# Patient Record
Sex: Female | Born: 1960
Health system: Southern US, Community
[De-identification: ages and names within clinical notes are randomized; demographics above are authoritative.]

## PROBLEM LIST (undated history)

## (undated) ENCOUNTER — Ambulatory Visit: Payer: Medicare PPO

## (undated) DIAGNOSIS — J189 Pneumonia, unspecified organism: Secondary | ICD-10-CM

## (undated) DIAGNOSIS — G4733 Obstructive sleep apnea (adult) (pediatric): Secondary | ICD-10-CM

## (undated) DIAGNOSIS — G2581 Restless legs syndrome: Secondary | ICD-10-CM

## (undated) DIAGNOSIS — R32 Unspecified urinary incontinence: Secondary | ICD-10-CM

## (undated) DIAGNOSIS — F444 Conversion disorder with motor symptom or deficit: Secondary | ICD-10-CM

## (undated) DIAGNOSIS — F419 Anxiety disorder, unspecified: Secondary | ICD-10-CM

## (undated) DIAGNOSIS — M545 Low back pain, unspecified: Secondary | ICD-10-CM

## (undated) DIAGNOSIS — M797 Fibromyalgia: Secondary | ICD-10-CM

## (undated) DIAGNOSIS — G8929 Other chronic pain: Secondary | ICD-10-CM

## (undated) DIAGNOSIS — I10 Essential (primary) hypertension: Secondary | ICD-10-CM

## (undated) DIAGNOSIS — E7800 Pure hypercholesterolemia, unspecified: Secondary | ICD-10-CM

## (undated) DIAGNOSIS — Z8639 Personal history of other endocrine, nutritional and metabolic disease: Secondary | ICD-10-CM

## (undated) DIAGNOSIS — G43909 Migraine, unspecified, not intractable, without status migrainosus: Secondary | ICD-10-CM

## (undated) DIAGNOSIS — R011 Cardiac murmur, unspecified: Secondary | ICD-10-CM

## (undated) DIAGNOSIS — G4730 Sleep apnea, unspecified: Secondary | ICD-10-CM

## (undated) DIAGNOSIS — K219 Gastro-esophageal reflux disease without esophagitis: Secondary | ICD-10-CM

## (undated) DIAGNOSIS — F32A Depression, unspecified: Secondary | ICD-10-CM

## (undated) DIAGNOSIS — E785 Hyperlipidemia, unspecified: Secondary | ICD-10-CM

## (undated) DIAGNOSIS — Z87442 Personal history of urinary calculi: Secondary | ICD-10-CM

## (undated) DIAGNOSIS — F449 Dissociative and conversion disorder, unspecified: Secondary | ICD-10-CM

## (undated) DIAGNOSIS — K5792 Diverticulitis of intestine, part unspecified, without perforation or abscess without bleeding: Secondary | ICD-10-CM

## (undated) DIAGNOSIS — F329 Major depressive disorder, single episode, unspecified: Secondary | ICD-10-CM

## (undated) DIAGNOSIS — Z8711 Personal history of peptic ulcer disease: Secondary | ICD-10-CM

## (undated) DIAGNOSIS — K589 Irritable bowel syndrome without diarrhea: Secondary | ICD-10-CM

## (undated) DIAGNOSIS — IMO0001 Reserved for inherently not codable concepts without codable children: Secondary | ICD-10-CM

## (undated) DIAGNOSIS — Z8719 Personal history of other diseases of the digestive system: Secondary | ICD-10-CM

## (undated) DIAGNOSIS — R4701 Aphasia: Secondary | ICD-10-CM

## (undated) DIAGNOSIS — R42 Dizziness and giddiness: Secondary | ICD-10-CM

## (undated) DIAGNOSIS — E669 Obesity, unspecified: Secondary | ICD-10-CM

## (undated) DIAGNOSIS — Z9989 Dependence on other enabling machines and devices: Secondary | ICD-10-CM

## (undated) DIAGNOSIS — Z531 Procedure and treatment not carried out because of patient's decision for reasons of belief and group pressure: Secondary | ICD-10-CM

## (undated) DIAGNOSIS — M1990 Unspecified osteoarthritis, unspecified site: Secondary | ICD-10-CM

## (undated) HISTORY — DX: Obesity, unspecified: E66.9

## (undated) HISTORY — PX: HERNIA REPAIR: SHX51

## (undated) HISTORY — DX: Migraine, unspecified, not intractable, without status migrainosus: G43.909

## (undated) HISTORY — DX: Unspecified osteoarthritis, unspecified site: M19.90

## (undated) HISTORY — PX: COLONOSCOPY: SHX174

## (undated) HISTORY — DX: Anxiety disorder, unspecified: F41.9

## (undated) HISTORY — DX: Hyperlipidemia, unspecified: E78.5

## (undated) HISTORY — DX: Essential (primary) hypertension: I10

## (undated) HISTORY — PX: BACK SURGERY: SHX140

## (undated) HISTORY — DX: Irritable bowel syndrome, unspecified: K58.9

## (undated) HISTORY — DX: Sleep apnea, unspecified: G47.30

## (undated) HISTORY — PX: UMBILICAL HERNIA REPAIR: SHX196

## (undated) HISTORY — DX: Cardiac murmur, unspecified: R01.1

## (undated) HISTORY — DX: Dissociative and conversion disorder, unspecified: F44.9

## (undated) HISTORY — DX: Major depressive disorder, single episode, unspecified: F32.9

## (undated) HISTORY — PX: UPPER GI ENDOSCOPY: SHX6162

## (undated) HISTORY — DX: Depression, unspecified: F32.A

## (undated) HISTORY — DX: Dizziness and giddiness: R42

## (undated) HISTORY — DX: Gastro-esophageal reflux disease without esophagitis: K21.9

---

## 1988-12-02 HISTORY — PX: ABDOMINAL HYSTERECTOMY: SHX81

## 1994-06-05 ENCOUNTER — Encounter (INDEPENDENT_AMBULATORY_CARE_PROVIDER_SITE_OTHER): Payer: Self-pay | Admitting: *Deleted

## 1998-03-23 ENCOUNTER — Ambulatory Visit (HOSPITAL_COMMUNITY): Admission: RE | Admit: 1998-03-23 | Discharge: 1998-03-23 | Payer: Self-pay | Admitting: Obstetrics and Gynecology

## 1998-04-05 ENCOUNTER — Encounter: Payer: Self-pay | Admitting: Gastroenterology

## 1998-10-23 ENCOUNTER — Other Ambulatory Visit: Admission: RE | Admit: 1998-10-23 | Discharge: 1998-10-23 | Payer: Self-pay | Admitting: Obstetrics and Gynecology

## 1998-11-28 ENCOUNTER — Ambulatory Visit (HOSPITAL_COMMUNITY): Admission: RE | Admit: 1998-11-28 | Discharge: 1998-11-28 | Payer: Self-pay | Admitting: Oral Surgery

## 1998-11-28 ENCOUNTER — Encounter: Payer: Self-pay | Admitting: Oral Surgery

## 1999-04-20 ENCOUNTER — Ambulatory Visit (HOSPITAL_COMMUNITY): Admission: RE | Admit: 1999-04-20 | Discharge: 1999-04-20 | Payer: Self-pay | Admitting: Internal Medicine

## 1999-04-20 ENCOUNTER — Encounter: Payer: Self-pay | Admitting: Internal Medicine

## 1999-07-31 ENCOUNTER — Other Ambulatory Visit: Admission: RE | Admit: 1999-07-31 | Discharge: 1999-07-31 | Payer: Self-pay | Admitting: Obstetrics and Gynecology

## 1999-08-03 ENCOUNTER — Ambulatory Visit (HOSPITAL_COMMUNITY): Admission: RE | Admit: 1999-08-03 | Discharge: 1999-08-03 | Payer: Self-pay | Admitting: Internal Medicine

## 1999-08-03 ENCOUNTER — Encounter: Payer: Self-pay | Admitting: Internal Medicine

## 2000-05-26 ENCOUNTER — Emergency Department (HOSPITAL_COMMUNITY): Admission: EM | Admit: 2000-05-26 | Discharge: 2000-05-26 | Payer: Self-pay | Admitting: *Deleted

## 2000-05-26 ENCOUNTER — Encounter: Payer: Self-pay | Admitting: *Deleted

## 2000-07-30 ENCOUNTER — Other Ambulatory Visit: Admission: RE | Admit: 2000-07-30 | Discharge: 2000-07-30 | Payer: Self-pay | Admitting: *Deleted

## 2000-08-10 ENCOUNTER — Encounter: Payer: Self-pay | Admitting: Emergency Medicine

## 2000-08-10 ENCOUNTER — Emergency Department (HOSPITAL_COMMUNITY): Admission: EM | Admit: 2000-08-10 | Discharge: 2000-08-10 | Payer: Self-pay | Admitting: Emergency Medicine

## 2000-08-12 ENCOUNTER — Ambulatory Visit (HOSPITAL_COMMUNITY): Admission: RE | Admit: 2000-08-12 | Discharge: 2000-08-12 | Payer: Self-pay | Admitting: Internal Medicine

## 2000-08-12 ENCOUNTER — Encounter: Payer: Self-pay | Admitting: Internal Medicine

## 2000-08-21 ENCOUNTER — Emergency Department (HOSPITAL_COMMUNITY): Admission: EM | Admit: 2000-08-21 | Discharge: 2000-08-21 | Payer: Self-pay | Admitting: Emergency Medicine

## 2000-09-03 ENCOUNTER — Encounter: Payer: Self-pay | Admitting: Internal Medicine

## 2000-09-15 ENCOUNTER — Inpatient Hospital Stay (HOSPITAL_COMMUNITY): Admission: EM | Admit: 2000-09-15 | Discharge: 2000-09-16 | Payer: Self-pay | Admitting: Emergency Medicine

## 2000-09-15 ENCOUNTER — Encounter: Payer: Self-pay | Admitting: Emergency Medicine

## 2000-10-07 ENCOUNTER — Encounter (INDEPENDENT_AMBULATORY_CARE_PROVIDER_SITE_OTHER): Payer: Self-pay | Admitting: *Deleted

## 2000-10-07 ENCOUNTER — Encounter (INDEPENDENT_AMBULATORY_CARE_PROVIDER_SITE_OTHER): Payer: Self-pay

## 2000-10-07 ENCOUNTER — Observation Stay (HOSPITAL_COMMUNITY): Admission: RE | Admit: 2000-10-07 | Discharge: 2000-10-08 | Payer: Self-pay | Admitting: General Surgery

## 2000-10-07 ENCOUNTER — Encounter: Payer: Self-pay | Admitting: General Surgery

## 2000-10-07 HISTORY — PX: LAPAROSCOPIC CHOLECYSTECTOMY: SUR755

## 2001-01-16 ENCOUNTER — Emergency Department (HOSPITAL_COMMUNITY): Admission: EM | Admit: 2001-01-16 | Discharge: 2001-01-16 | Payer: Self-pay | Admitting: Emergency Medicine

## 2001-01-16 ENCOUNTER — Encounter: Payer: Self-pay | Admitting: Emergency Medicine

## 2001-02-24 ENCOUNTER — Ambulatory Visit (HOSPITAL_COMMUNITY): Admission: RE | Admit: 2001-02-24 | Discharge: 2001-02-24 | Payer: Self-pay | Admitting: Orthopaedic Surgery

## 2001-02-24 ENCOUNTER — Encounter: Payer: Self-pay | Admitting: Orthopaedic Surgery

## 2001-07-30 ENCOUNTER — Other Ambulatory Visit: Admission: RE | Admit: 2001-07-30 | Discharge: 2001-07-30 | Payer: Self-pay | Admitting: Obstetrics and Gynecology

## 2001-09-17 ENCOUNTER — Ambulatory Visit (HOSPITAL_BASED_OUTPATIENT_CLINIC_OR_DEPARTMENT_OTHER): Admission: RE | Admit: 2001-09-17 | Discharge: 2001-09-17 | Payer: Self-pay | Admitting: Otolaryngology

## 2002-08-24 ENCOUNTER — Other Ambulatory Visit: Admission: RE | Admit: 2002-08-24 | Discharge: 2002-08-24 | Payer: Self-pay | Admitting: Obstetrics and Gynecology

## 2003-03-04 ENCOUNTER — Encounter (INDEPENDENT_AMBULATORY_CARE_PROVIDER_SITE_OTHER): Payer: Self-pay | Admitting: *Deleted

## 2003-03-04 ENCOUNTER — Ambulatory Visit (HOSPITAL_COMMUNITY): Admission: RE | Admit: 2003-03-04 | Discharge: 2003-03-04 | Payer: Self-pay | Admitting: Internal Medicine

## 2003-03-04 ENCOUNTER — Encounter: Payer: Self-pay | Admitting: Internal Medicine

## 2003-05-01 ENCOUNTER — Emergency Department (HOSPITAL_COMMUNITY): Admission: EM | Admit: 2003-05-01 | Discharge: 2003-05-01 | Payer: Self-pay | Admitting: Emergency Medicine

## 2003-05-01 ENCOUNTER — Encounter: Payer: Self-pay | Admitting: Emergency Medicine

## 2003-08-30 ENCOUNTER — Other Ambulatory Visit: Admission: RE | Admit: 2003-08-30 | Discharge: 2003-08-30 | Payer: Self-pay | Admitting: Obstetrics and Gynecology

## 2004-02-10 ENCOUNTER — Inpatient Hospital Stay (HOSPITAL_COMMUNITY): Admission: RE | Admit: 2004-02-10 | Discharge: 2004-02-14 | Payer: Self-pay | Admitting: Neurosurgery

## 2004-02-10 HISTORY — PX: ANTERIOR CERVICAL DECOMP/DISCECTOMY FUSION: SHX1161

## 2004-04-10 ENCOUNTER — Encounter (INDEPENDENT_AMBULATORY_CARE_PROVIDER_SITE_OTHER): Payer: Self-pay | Admitting: *Deleted

## 2004-04-10 ENCOUNTER — Encounter: Payer: Self-pay | Admitting: Endocrinology

## 2004-04-10 ENCOUNTER — Ambulatory Visit (HOSPITAL_COMMUNITY): Admission: RE | Admit: 2004-04-10 | Discharge: 2004-04-10 | Payer: Self-pay | Admitting: Endocrinology

## 2004-04-16 ENCOUNTER — Encounter: Admission: RE | Admit: 2004-04-16 | Discharge: 2004-05-16 | Payer: Self-pay | Admitting: Neurosurgery

## 2004-05-08 ENCOUNTER — Encounter: Payer: Self-pay | Admitting: Internal Medicine

## 2004-05-08 ENCOUNTER — Ambulatory Visit (HOSPITAL_COMMUNITY): Admission: RE | Admit: 2004-05-08 | Discharge: 2004-05-08 | Payer: Self-pay | Admitting: Internal Medicine

## 2004-10-20 ENCOUNTER — Ambulatory Visit: Payer: Self-pay | Admitting: Psychiatry

## 2004-10-20 ENCOUNTER — Emergency Department (HOSPITAL_COMMUNITY): Admission: EM | Admit: 2004-10-20 | Discharge: 2004-10-20 | Payer: Self-pay | Admitting: Emergency Medicine

## 2004-10-20 ENCOUNTER — Inpatient Hospital Stay (HOSPITAL_COMMUNITY): Admission: EM | Admit: 2004-10-20 | Discharge: 2004-10-21 | Payer: Self-pay | Admitting: Psychiatry

## 2004-12-05 ENCOUNTER — Ambulatory Visit: Payer: Self-pay | Admitting: Internal Medicine

## 2004-12-13 ENCOUNTER — Ambulatory Visit: Payer: Self-pay | Admitting: Internal Medicine

## 2005-01-29 ENCOUNTER — Ambulatory Visit: Payer: Self-pay | Admitting: Internal Medicine

## 2005-01-31 ENCOUNTER — Ambulatory Visit: Payer: Self-pay | Admitting: Internal Medicine

## 2005-08-01 ENCOUNTER — Emergency Department (HOSPITAL_COMMUNITY): Admission: EM | Admit: 2005-08-01 | Discharge: 2005-08-01 | Payer: Self-pay | Admitting: Family Medicine

## 2005-08-04 ENCOUNTER — Emergency Department (HOSPITAL_COMMUNITY): Admission: AD | Admit: 2005-08-04 | Discharge: 2005-08-04 | Payer: Self-pay | Admitting: Family Medicine

## 2005-09-29 ENCOUNTER — Emergency Department (HOSPITAL_COMMUNITY): Admission: EM | Admit: 2005-09-29 | Discharge: 2005-09-29 | Payer: Self-pay | Admitting: Emergency Medicine

## 2006-02-10 ENCOUNTER — Ambulatory Visit: Payer: Self-pay | Admitting: Internal Medicine

## 2007-06-14 ENCOUNTER — Inpatient Hospital Stay (HOSPITAL_COMMUNITY): Admission: EM | Admit: 2007-06-14 | Discharge: 2007-06-18 | Payer: Self-pay | Admitting: Emergency Medicine

## 2007-06-17 ENCOUNTER — Ambulatory Visit: Payer: Self-pay | Admitting: Internal Medicine

## 2007-06-22 ENCOUNTER — Ambulatory Visit: Payer: Self-pay | Admitting: Internal Medicine

## 2007-06-23 DIAGNOSIS — R51 Headache: Secondary | ICD-10-CM

## 2007-06-23 DIAGNOSIS — K589 Irritable bowel syndrome without diarrhea: Secondary | ICD-10-CM

## 2007-06-23 DIAGNOSIS — F988 Other specified behavioral and emotional disorders with onset usually occurring in childhood and adolescence: Secondary | ICD-10-CM | POA: Insufficient documentation

## 2007-06-23 DIAGNOSIS — K219 Gastro-esophageal reflux disease without esophagitis: Secondary | ICD-10-CM | POA: Insufficient documentation

## 2007-06-23 DIAGNOSIS — E041 Nontoxic single thyroid nodule: Secondary | ICD-10-CM | POA: Insufficient documentation

## 2007-06-23 DIAGNOSIS — F4321 Adjustment disorder with depressed mood: Secondary | ICD-10-CM | POA: Insufficient documentation

## 2007-06-23 DIAGNOSIS — E785 Hyperlipidemia, unspecified: Secondary | ICD-10-CM

## 2007-06-23 DIAGNOSIS — M26609 Unspecified temporomandibular joint disorder, unspecified side: Secondary | ICD-10-CM | POA: Insufficient documentation

## 2007-07-07 ENCOUNTER — Ambulatory Visit: Payer: Self-pay | Admitting: Internal Medicine

## 2007-08-05 ENCOUNTER — Ambulatory Visit: Payer: Self-pay | Admitting: Internal Medicine

## 2007-09-10 ENCOUNTER — Encounter: Payer: Self-pay | Admitting: Internal Medicine

## 2007-10-02 ENCOUNTER — Ambulatory Visit: Payer: Self-pay | Admitting: Internal Medicine

## 2007-10-02 ENCOUNTER — Encounter: Payer: Self-pay | Admitting: Internal Medicine

## 2007-12-31 ENCOUNTER — Encounter: Payer: Self-pay | Admitting: Internal Medicine

## 2008-01-19 ENCOUNTER — Ambulatory Visit: Payer: Self-pay | Admitting: Internal Medicine

## 2008-01-19 DIAGNOSIS — R42 Dizziness and giddiness: Secondary | ICD-10-CM | POA: Insufficient documentation

## 2008-01-19 DIAGNOSIS — R197 Diarrhea, unspecified: Secondary | ICD-10-CM

## 2008-01-21 ENCOUNTER — Telehealth: Payer: Self-pay | Admitting: Internal Medicine

## 2008-04-18 ENCOUNTER — Ambulatory Visit: Payer: Self-pay | Admitting: Internal Medicine

## 2008-04-19 LAB — CONVERTED CEMR LAB
BUN: 7 mg/dL (ref 6–23)
CO2: 29 meq/L (ref 19–32)
Chloride: 108 meq/L (ref 96–112)
Creatinine, Ser: 0.7 mg/dL (ref 0.4–1.2)
Potassium: 4.3 meq/L (ref 3.5–5.1)
Sodium: 140 meq/L (ref 135–145)
TSH: 0.83 microintl units/mL (ref 0.35–5.50)

## 2008-05-02 ENCOUNTER — Ambulatory Visit: Payer: Self-pay | Admitting: Internal Medicine

## 2008-05-02 DIAGNOSIS — R110 Nausea: Secondary | ICD-10-CM

## 2008-05-09 ENCOUNTER — Telehealth: Payer: Self-pay | Admitting: Internal Medicine

## 2008-05-11 ENCOUNTER — Telehealth: Payer: Self-pay | Admitting: Internal Medicine

## 2008-05-11 ENCOUNTER — Encounter (INDEPENDENT_AMBULATORY_CARE_PROVIDER_SITE_OTHER): Payer: Self-pay | Admitting: *Deleted

## 2008-05-12 ENCOUNTER — Encounter: Payer: Self-pay | Admitting: Internal Medicine

## 2008-05-13 ENCOUNTER — Telehealth: Payer: Self-pay | Admitting: Gastroenterology

## 2008-05-24 ENCOUNTER — Encounter: Payer: Self-pay | Admitting: Internal Medicine

## 2008-05-27 ENCOUNTER — Ambulatory Visit: Payer: Self-pay | Admitting: Internal Medicine

## 2008-05-31 ENCOUNTER — Telehealth (INDEPENDENT_AMBULATORY_CARE_PROVIDER_SITE_OTHER): Payer: Self-pay | Admitting: *Deleted

## 2008-05-31 ENCOUNTER — Ambulatory Visit (HOSPITAL_COMMUNITY): Admission: RE | Admit: 2008-05-31 | Discharge: 2008-05-31 | Payer: Self-pay | Admitting: Internal Medicine

## 2008-05-31 ENCOUNTER — Encounter (INDEPENDENT_AMBULATORY_CARE_PROVIDER_SITE_OTHER): Payer: Self-pay | Admitting: *Deleted

## 2008-08-19 ENCOUNTER — Ambulatory Visit: Payer: Self-pay | Admitting: Internal Medicine

## 2008-08-19 DIAGNOSIS — R1310 Dysphagia, unspecified: Secondary | ICD-10-CM | POA: Insufficient documentation

## 2008-08-19 DIAGNOSIS — E669 Obesity, unspecified: Secondary | ICD-10-CM

## 2008-08-22 ENCOUNTER — Telehealth: Payer: Self-pay | Admitting: Internal Medicine

## 2008-08-30 DIAGNOSIS — K222 Esophageal obstruction: Secondary | ICD-10-CM | POA: Insufficient documentation

## 2008-08-30 DIAGNOSIS — G4700 Insomnia, unspecified: Secondary | ICD-10-CM | POA: Insufficient documentation

## 2008-08-31 ENCOUNTER — Ambulatory Visit: Payer: Self-pay | Admitting: Internal Medicine

## 2008-08-31 DIAGNOSIS — R1319 Other dysphagia: Secondary | ICD-10-CM | POA: Insufficient documentation

## 2008-09-14 ENCOUNTER — Encounter (INDEPENDENT_AMBULATORY_CARE_PROVIDER_SITE_OTHER): Payer: Self-pay | Admitting: *Deleted

## 2008-09-14 ENCOUNTER — Ambulatory Visit (HOSPITAL_COMMUNITY): Admission: RE | Admit: 2008-09-14 | Discharge: 2008-09-14 | Payer: Self-pay | Admitting: Internal Medicine

## 2008-09-14 ENCOUNTER — Encounter: Payer: Self-pay | Admitting: Internal Medicine

## 2008-10-03 ENCOUNTER — Telehealth: Payer: Self-pay | Admitting: Internal Medicine

## 2008-10-07 ENCOUNTER — Ambulatory Visit: Payer: Self-pay | Admitting: Internal Medicine

## 2008-10-07 DIAGNOSIS — I10 Essential (primary) hypertension: Secondary | ICD-10-CM

## 2008-11-22 ENCOUNTER — Ambulatory Visit: Payer: Self-pay | Admitting: Internal Medicine

## 2008-11-22 DIAGNOSIS — R5383 Other fatigue: Secondary | ICD-10-CM

## 2008-11-22 DIAGNOSIS — F411 Generalized anxiety disorder: Secondary | ICD-10-CM

## 2008-11-22 DIAGNOSIS — R5381 Other malaise: Secondary | ICD-10-CM | POA: Insufficient documentation

## 2009-01-02 ENCOUNTER — Ambulatory Visit: Payer: Self-pay | Admitting: Internal Medicine

## 2009-04-06 ENCOUNTER — Telehealth: Payer: Self-pay | Admitting: Internal Medicine

## 2009-09-12 ENCOUNTER — Telehealth: Payer: Self-pay | Admitting: Internal Medicine

## 2009-11-13 ENCOUNTER — Ambulatory Visit: Payer: Self-pay | Admitting: Internal Medicine

## 2009-11-27 ENCOUNTER — Ambulatory Visit: Payer: Self-pay | Admitting: Internal Medicine

## 2009-11-27 ENCOUNTER — Telehealth: Payer: Self-pay | Admitting: Family Medicine

## 2009-11-27 DIAGNOSIS — L299 Pruritus, unspecified: Secondary | ICD-10-CM | POA: Insufficient documentation

## 2009-11-27 DIAGNOSIS — Z87891 Personal history of nicotine dependence: Secondary | ICD-10-CM

## 2009-11-28 ENCOUNTER — Telehealth: Payer: Self-pay | Admitting: Internal Medicine

## 2010-01-04 ENCOUNTER — Emergency Department (HOSPITAL_COMMUNITY): Admission: EM | Admit: 2010-01-04 | Discharge: 2010-01-04 | Payer: Self-pay | Admitting: Emergency Medicine

## 2010-01-05 ENCOUNTER — Ambulatory Visit: Payer: Self-pay | Admitting: Internal Medicine

## 2010-01-05 DIAGNOSIS — R7309 Other abnormal glucose: Secondary | ICD-10-CM | POA: Insufficient documentation

## 2010-01-08 ENCOUNTER — Ambulatory Visit: Payer: Self-pay | Admitting: Internal Medicine

## 2010-01-09 LAB — CONVERTED CEMR LAB
BUN: 11 mg/dL (ref 6–23)
CO2: 29 meq/L (ref 19–32)
Calcium: 9.2 mg/dL (ref 8.4–10.5)
Chloride: 102 meq/L (ref 96–112)
Cholesterol: 261 mg/dL — ABNORMAL HIGH (ref 0–200)
Creatinine, Ser: 0.9 mg/dL (ref 0.4–1.2)
Direct LDL: 170.1 mg/dL
GFR calc non Af Amer: 85.37 mL/min (ref >60)
Glucose, Bld: 81 mg/dL (ref 70–99)
HDL: 51.4 mg/dL (ref >39.00)
Hgb A1c MFr Bld: 5.2 % (ref 4.6–6.5)
Potassium: 3.9 meq/L (ref 3.5–5.1)
Sodium: 140 meq/L (ref 135–145)
TSH: 1.39 microintl units/mL (ref 0.35–5.50)
Total CHOL/HDL Ratio: 5
Triglycerides: 210 mg/dL — ABNORMAL HIGH (ref 0.0–149.0)
VLDL: 42 mg/dL — ABNORMAL HIGH (ref 0.0–40.0)
Vitamin B-12: 227 pg/mL (ref 211–911)

## 2010-02-27 ENCOUNTER — Ambulatory Visit: Payer: Self-pay | Admitting: Internal Medicine

## 2010-02-27 DIAGNOSIS — S60559A Superficial foreign body of unspecified hand, initial encounter: Secondary | ICD-10-CM | POA: Insufficient documentation

## 2010-03-22 ENCOUNTER — Ambulatory Visit (HOSPITAL_COMMUNITY): Admission: RE | Admit: 2010-03-22 | Discharge: 2010-03-22 | Payer: Self-pay | Admitting: Internal Medicine

## 2010-03-28 ENCOUNTER — Encounter: Admission: RE | Admit: 2010-03-28 | Discharge: 2010-03-28 | Payer: Self-pay | Admitting: Internal Medicine

## 2010-04-02 ENCOUNTER — Encounter: Payer: Self-pay | Admitting: Internal Medicine

## 2010-05-01 ENCOUNTER — Ambulatory Visit: Payer: Self-pay | Admitting: Internal Medicine

## 2010-05-01 DIAGNOSIS — M76899 Other specified enthesopathies of unspecified lower limb, excluding foot: Secondary | ICD-10-CM

## 2010-05-01 DIAGNOSIS — M722 Plantar fascial fibromatosis: Secondary | ICD-10-CM | POA: Insufficient documentation

## 2010-05-07 ENCOUNTER — Emergency Department (HOSPITAL_COMMUNITY): Admission: EM | Admit: 2010-05-07 | Discharge: 2010-05-07 | Payer: Self-pay | Admitting: Emergency Medicine

## 2010-05-10 ENCOUNTER — Ambulatory Visit: Payer: Self-pay | Admitting: Internal Medicine

## 2010-05-10 DIAGNOSIS — R1013 Epigastric pain: Secondary | ICD-10-CM | POA: Insufficient documentation

## 2010-05-10 DIAGNOSIS — M542 Cervicalgia: Secondary | ICD-10-CM

## 2010-05-10 DIAGNOSIS — B370 Candidal stomatitis: Secondary | ICD-10-CM

## 2010-05-11 LAB — CONVERTED CEMR LAB
Albumin: 4.4 g/dL (ref 3.5–5.2)
Alkaline Phosphatase: 49 units/L (ref 39–117)
BUN: 9 mg/dL (ref 6–23)
Basophils Absolute: 0.1 10*3/uL (ref 0.0–0.1)
Bilirubin Urine: NEGATIVE
CO2: 26 meq/L (ref 19–32)
Creatinine, Ser: 0.6 mg/dL (ref 0.4–1.2)
Eosinophils Absolute: 0.2 10*3/uL (ref 0.0–0.7)
Eosinophils Relative: 1.8 % (ref 0.0–5.0)
Glucose, Bld: 93 mg/dL (ref 70–99)
HCT: 36 % (ref 36.0–46.0)
Hemoglobin: 12.4 g/dL (ref 12.0–15.0)
Leukocytes, UA: NEGATIVE
MCHC: 34.5 g/dL (ref 30.0–36.0)
MCV: 89 fL (ref 78.0–100.0)
Monocytes Relative: 6 % (ref 3.0–12.0)
Neutro Abs: 5.4 10*3/uL (ref 1.4–7.7)
Nitrite: NEGATIVE
Platelets: 310 10*3/uL (ref 150.0–400.0)
Potassium: 4.1 meq/L (ref 3.5–5.1)
RBC: 4.05 M/uL (ref 3.87–5.11)
RDW: 12.6 % (ref 11.5–14.6)
Sed Rate: 27 mm/hr — ABNORMAL HIGH (ref 0–22)
Sodium: 140 meq/L (ref 135–145)
Specific Gravity, Urine: >=1.03 (ref 1.000–1.030)
Total Protein, Urine: NEGATIVE mg/dL
Vitamin B-12: 283 pg/mL (ref 211–911)
WBC: 9.1 10*3/uL (ref 4.5–10.5)
pH: 5.5 (ref 5.0–8.0)

## 2010-05-25 ENCOUNTER — Ambulatory Visit: Payer: Self-pay | Admitting: Internal Medicine

## 2010-06-06 ENCOUNTER — Ambulatory Visit: Payer: Self-pay | Admitting: Internal Medicine

## 2010-06-06 DIAGNOSIS — K5289 Other specified noninfective gastroenteritis and colitis: Secondary | ICD-10-CM | POA: Insufficient documentation

## 2010-07-17 ENCOUNTER — Ambulatory Visit: Payer: Self-pay | Admitting: Internal Medicine

## 2010-07-17 DIAGNOSIS — L719 Rosacea, unspecified: Secondary | ICD-10-CM | POA: Insufficient documentation

## 2010-07-17 DIAGNOSIS — M79609 Pain in unspecified limb: Secondary | ICD-10-CM | POA: Insufficient documentation

## 2010-07-30 ENCOUNTER — Ambulatory Visit: Payer: Self-pay | Admitting: Internal Medicine

## 2010-07-30 DIAGNOSIS — R059 Cough, unspecified: Secondary | ICD-10-CM | POA: Insufficient documentation

## 2010-07-30 DIAGNOSIS — J019 Acute sinusitis, unspecified: Secondary | ICD-10-CM

## 2010-07-30 DIAGNOSIS — J040 Acute laryngitis: Secondary | ICD-10-CM | POA: Insufficient documentation

## 2010-07-30 DIAGNOSIS — R05 Cough: Secondary | ICD-10-CM

## 2010-09-25 ENCOUNTER — Encounter (INDEPENDENT_AMBULATORY_CARE_PROVIDER_SITE_OTHER): Payer: Self-pay | Admitting: *Deleted

## 2010-09-25 ENCOUNTER — Ambulatory Visit: Payer: Self-pay | Admitting: Internal Medicine

## 2010-09-26 ENCOUNTER — Telehealth: Payer: Self-pay | Admitting: Internal Medicine

## 2010-09-26 ENCOUNTER — Encounter: Payer: Self-pay | Admitting: Internal Medicine

## 2010-12-17 ENCOUNTER — Ambulatory Visit
Admission: RE | Admit: 2010-12-17 | Discharge: 2010-12-17 | Payer: Self-pay | Source: Home / Self Care | Attending: Gastroenterology | Admitting: Gastroenterology

## 2011-01-01 NOTE — Assessment & Plan Note (Signed)
Summary: post hosp [facility]/#/cd   Vital Signs:  Patient profile:   50 year old female Height:      64 inches Weight:      212 pounds BMI:     36.52 O2 Sat:      98 % on Room air Temp:     98.3 degrees F oral Pulse rate:   65 / minute BP sitting:   140 / 88  (left arm) Cuff size:   large  Vitals Entered By: Lucious Groves (May 10, 2010 9:45 AM)  O2 Flow:  Room air CC: Post hosp--C/O dizziness, fatigue, diarrhea, nausea/gi upset, and swelling./kb Is Patient Diabetic? No Pain Assessment Patient in pain? no      Comments Patient notes that she is not taking Toviaz./kb   Primary Care Provider:  Sonda Primes, MD  CC:  Post hosp--C/O dizziness, fatigue, diarrhea, nausea/gi upset, and and swelling./kb.  History of Present Illness: The patient presents for a post-hospital/ER visit for neck pain, ST after drinking a milk shake C/o weakness, dizziness  Current Medications (verified): 1)  Omeprazole 20 Mg Cpdr (Omeprazole) .[name].. 1 By Mouth Two Times A Day 2)  Vitamin D 1000 Unit Tabs (Cholecalciferol) .[name].. 1 By Mouth Qd 3)  Toviaz 4 Mg Xr24h-Tab (Fesoterodine Fumarate) .[name].. 1 By Mouth Once Daily Prn 4)  Cephalexin 500 Mg Caps (Cephalexin) .[name].. 1 By Mouth Three Times A Day  Allergies (verified): 1)  ! Vicodin 2)  ! Asa 3)  ! Avelox (Moxifloxacin Hcl)  Past History:  Past Surgical History: Last updated: 08/30/2008 Cholecystectomy Hysterectomy C-Spine Fusion C-Section x2 Umbilical Hernia Repair bladder removal  Past Medical History: GERD  Dr Marina Goodell Irritable Bowel Syndrome TMJ Thyroid Nodule, H/O Headache ADD Depression Insomnia Hyperlipidemia Chronic neck pain OSA BPV 2008, severe Hypertension Anxiety Borderline low Vit B12 2011  Review of Systems       The patient complains of abdominal pain and depression.  The patient denies fever, chest pain, and dyspnea on exertion.         ST  Physical Exam  General:   alert, overweight-appearing.   Nose:   WNL Mouth:  Clear Neck:  supple, almost full ROM, no masses, no JVD, no cervical lymphadenopathy, and some anterior neck tenderness.   Lungs:  CTA Heart:  Normal rate and regular rhythm. S1 and S2 normal without gallop, murmur, click, rub or other extra sounds. Abdomen:  soft, non-tender, normal bowel sounds, no distention, and no masses.   Msk:  No deformity or scoliosis noted of thoracic or lumbar spine.   Neurologic:  No cranial nerve deficits noted. Station and gait are normal. Plantar reflexes are down-going bilaterally. DTRs are symmetrical throughout. Sensory, motor and coordinative functions appear intact. Skin:  No hives or rash  Psych:  Oriented X3.  slightly anxious.     Impression & Recommendations:  Problem # 1:  THRUSH (ICD-112.0) - poss candidal esophagitis Assessment New Oravig x 10 d Orders: TLB-B12, Serum-Total ONLY (16109-U04) TLB-BMP (Basic Metabolic Panel-BMET) (80048-METABOL) TLB-Hepatic/Liver Function Pnl (80076-HEPATIC) TLB-Lipase (83690-LIPASE) TLB-Sedimentation Rate (ESR) (85652-ESR) TLB-TSH (Thyroid Stimulating Hormone) (84443-TSH) TLB-Udip ONLY (81003-UDIP) TLB-CBC Platelet - w/Differential (85025-CBCD)  Problem # 2:  NECK PAIN (ICD-723.1)/ST - poss due to #1 Assessment: Unchanged  Orders: T-Cervicle Spine 2-3 Views (72040TC) TLB-B12, Serum-Total ONLY (54098-J19) TLB-BMP (Basic Metabolic Panel-BMET) (80048-METABOL) TLB-Hepatic/Liver Function Pnl (80076-HEPATIC) TLB-Lipase (83690-LIPASE) TLB-Sedimentation Rate (ESR) (85652-ESR) TLB-TSH (Thyroid Stimulating Hormone) (84443-TSH) TLB-Udip ONLY (81003-UDIP) TLB-CBC Platelet - w/Differential (85025-CBCD)  Problem # 3:  ABDOMINAL PAIN, EPIGASTRIC (ICD-789.06) Assessment: Deteriorated Given Dexilant Orders: TLB-B12, Serum-Total ONLY (62376-E83) TLB-BMP (Basic Metabolic Panel-BMET) (80048-METABOL) TLB-Hepatic/Liver Function Pnl (80076-HEPATIC) TLB-Lipase (83690-LIPASE) TLB-Sedimentation Rate (ESR)  (85652-ESR) TLB-TSH (Thyroid Stimulating Hormone) (84443-TSH) TLB-Udip ONLY (81003-UDIP) TLB-CBC Platelet - w/Differential (85025-CBCD)  Problem # 4:  PHARYNGITIS-ACUTE (ICD-462) Assessment: Comment Only  The following medications were removed from the medication list:    Cephalexin 500 Mg Caps (Cephalexin) .[name]... 1 by mouth three times a day - completed Her updated medication list for this problem includes:    Oravig 50 Mg Tabs (Miconazole) .[name]... 1 qam under cheek  to melt  Problem # 5:  Low nl B12 Assessment: New Start B12  Complete Medication List: 1)  Vitamin D 1000 Unit Tabs (Cholecalciferol) .[name].. 1 by mouth qd 2)  Toviaz 4 Mg Xr24h-tab (Fesoterodine fumarate) .[name].. 1 by mouth once daily prn 3)  Dexilant 60 Mg Cpdr (Dexlansoprazole) .[name].. 1 by mouth q am for indigestion 4)  Oravig 50 Mg Tabs (Miconazole) .[name].. 1 qam under cheek  to melt 5)  Vitamin B-12 500 Mcg Tabs (Cyanocobalamin) .[name].. 1 by mouth once daily for vitamin b12 deficiency  Patient Instructions: 1)  Call if you are not better in a reasonable amount of time or if worse. Go to ER if feeling really bad!  2)  Please schedule a follow-up appointment in 2 weeks. Prescriptions: VITAMIN B-12 500 MCG TABS (CYANOCOBALAMIN) 1 by mouth once daily for Vitamin B12 deficiency  #30 x 12   Entered and Authorized by:   Tresa Garter MD   Signed by:   Tresa Garter MD on 05/10/2010   Method used:   Print then Give to Patient   RxID:   [id number] DEXILANT 60 MG CPDR (DEXLANSOPRAZOLE) 1 by mouth q am for indigestion  #90 x 3   Entered and Authorized by:   Tresa Garter MD   Signed by:   Tresa Garter MD on 05/10/2010   Method used:   Print then Give to Patient   RxID:   [id number]

## 2011-01-01 NOTE — Assessment & Plan Note (Signed)
Summary: DR AVP PT/NO SLOT--DIARRHEA  VOMITING   STC   Vital Signs:  Patient profile:   50 year old female Height:      64 inches (162.56 cm) Weight:      211.0 pounds (95.91 kg) O2 Sat:      97 % on Room air Temp:     99.0 degrees F (37.22 degrees C) oral Pulse rate:   89 / minute BP sitting:   110 / 92  (left arm) Cuff size:   large  Vitals Entered By: Orlan Leavens RMA (September 25, 2010 11:27 AM)  O2 Flow:  Room air CC: Nausea/ diarrhea/ vomitting, Diarrhea Is Patient Diabetic? No Pain Assessment Patient in pain? no      Comments Pt states sxs started last night. Also having headache, chills and dizziness   Primary Care Provider:  Sonda Primes, MD  CC:  Nausea/ diarrhea/ vomitting and Diarrhea.  History of Present Illness:  Diarrhea      This is a 50 year old woman who presents with Diarrhea.  The symptoms began 8-12 hrs ago.  The severity is described as severe.  The patient reports 4-6 stools per day, watery/unformed stools, voluminous stools, nocturnal diarrhea, and fasting diarrhea, but denies blood in stool and mucus in stool.  Associated symptoms include abdominal cramps, nausea, vomiting, and lightheadedness.  The patient denies fever, increased thirst, weight loss, mouth ulcers, and eye redness.  The symptoms are worse with any food and any liquid.  The symptoms are better with fasting.  Patient's risk factors for diarrhea include pediatric exposure to g-kids with same GI bug.  History IBS symptoms - usually constipated.  Current Medications (verified): 1)  Dexilant 60 Mg Cpdr (Dexlansoprazole) .[name].. 1 By Mouth Q Am For Indigestion 2)  Vitamin B-12 500 Mcg Tabs (Cyanocobalamin) .[name].. 1 By Mouth Once Daily For Vitamin B12 Deficiency 3)  Vitamin D 1000 Unit Tabs (Cholecalciferol) .[name].. 1 By Mouth Qd 4)  Align  Caps (Probiotic Product) .[name].. 1 By Mouth Once Daily 5)  Hydrochlorothiazide 12.5 Mg Caps (Hydrochlorothiazide) .[name].. 1 By Mouth Once Daily For Blood Pressure 6)   Metronidazole 0.75 % Lotn (Metronidazole) .... On Face Qhs  Allergies (verified): 1)  ! Vicodin 2)  ! Asa 3)  ! Avelox (Moxifloxacin Hcl)  Past History:  Past Medical History: GERD  Dr Marina Goodell Irritable Bowel Syndrome D-C  TMJ Thyroid Nodule, H/O Headache ADD Depression Insomnia Hyperlipidemia Chronic neck pain OSA BPV 2008, severe Hypertension Anxiety Borderline low Vit B12 2011 GERD Acnae  Review of Systems  The patient denies hoarseness, chest pain, syncope, melena, and hematochezia.    Physical Exam  General:  alert, well-developed, well-nourished, and cooperative to examination. mild-mod ill holding emesis basin   spouse at side Lungs:  normal respiratory effort, no intercostal retractions or use of accessory muscles; normal breath sounds bilaterally - no crackles and no wheezes.    Heart:  normal rate, regular rhythm, no murmur, and no rub. BLE without edema. Abdomen:  soft, non-tender, hyperactive bowel sounds, no distention; no masses and no appreciable hepatomegaly or splenomegaly.     Impression & Recommendations:  Problem # 1:  GASTROENTERITIS (ICD-558.9)  acute onset acute nausea and vomiting associated with diarrhea chills and fever most consistent with acute gastroenteritis, likely viral. Not unusual for slower improvement in IBS patient's. No worrisome features on exam or history. reassurance provided  Recommended phenergan - IM and by mouth+pr as needed - erx done Discussed use of medication and role  of diet. Encouraged clear liquids and electrolyte replacement fluids. Instructed to call if any signs of worsening dehydration.   Orders: Prescription Created Electronically [phone]) Promethazine up to 50mg  (J2550) Admin of Therapeutic Inj  intramuscular or subcutaneous (60454)  Complete Medication List: 1)  Dexilant 60 Mg Cpdr (Dexlansoprazole) .[name].. 1 by mouth q am for indigestion 2)  Vitamin B-12 500 Mcg Tabs (Cyanocobalamin) .[name].. 1 by mouth once  daily for vitamin b12 deficiency 3)  Vitamin D 1000 Unit Tabs (Cholecalciferol) .[name].. 1 by mouth qd 4)  Align Caps (Probiotic product) .[name].. 1 by mouth once daily 5)  Hydrochlorothiazide 12.5 Mg Caps (Hydrochlorothiazide) .[name].. 1 by mouth once daily for blood pressure 6)  Metronidazole 0.75 % Lotn (Metronidazole) .... On face qhs 7)  Promethazine Hcl 25 Mg Tabs (Promethazine hcl) .[name].. 1 by mouth every 4 hours as needed for nausea and vomitting 8)  Promethazine Hcl 25 Mg Supp (Promethazine hcl) .[name].. 1 by rectum every 4 hours as needed for nausea/vomitting if unable to keep pill down  Patient Instructions: 1)  it was good to see you today. 2)  phenergan shot today - also use phenergan pills and suppository of same medication - your prescriptions have been electronically submitted to your pharmacy. Please take as directed. Contact our office if you believe you're having problems with the medication(s).  3)  use immodium for diarrhea 4)  the main problem with gastroenteritis is dehydration. Drink plenty of fluids and take solids as you feel better. 5)  Get plenty of rest, drink lots of clear liquids, and use Tylenol or Ibuprofen for comfort. Return in 7-10 days if you're not better:sooner if you're feeling worse. 6)  work note provided x next 48h Prescriptions: PROMETHAZINE HCL 25 MG TABS (PROMETHAZINE HCL) 1 by mouth every 4 hours as needed for nausea and vomitting  #30 x 1   Entered and Authorized by:   Newt Lukes MD   Signed by:   Newt Lukes MD on 09/25/2010   Method used:   Electronically to        [facility] (retail)       [address]       [place], [place]  [address]       Ph: [phone]       Fax: [phone]   RxID:   [id number] PROMETHAZINE HCL 25 MG SUPP (PROMETHAZINE HCL) 1 by rectum every 4 hours as needed for nausea/vomitting if unable to keep pill down  #10 x 0   Entered and Authorized by:   Newt Lukes MD   Signed by:    Newt Lukes MD on 09/25/2010   Method used:   Electronically to        [facility] (retail)       [address]       [place], [place]  [address]       Ph: [phone]       Fax: [phone]   RxID:   [id number]    Medication Administration  Injection # 1:    Medication: Promethazine up to 50mg     Diagnosis: GASTROENTERITIS (ICD-558.9)    Route: IM    Site: RUOQ gluteus    Exp Date: 10/2011    Lot #: [id number]    Mfr: baxter    Comments: Gave total of 25mg     Patient tolerated injection  without complications    Given by: Orlan Leavens RMA (September 25, 2010 12:02 PM)  Orders Added: 1)  Est. Patient Level IV [99214] 2)  Prescription Created Electronically [phone] 3)  Promethazine up to 50mg  [J2550] 4)  Admin of Therapeutic Inj  intramuscular or subcutaneous [65784]

## 2011-01-01 NOTE — Op Note (Signed)
Summary: Operative Report-cholecystectomy

## 2011-01-01 NOTE — Progress Notes (Signed)
Summary: Work note extention  Phone Note Call from Patient   Caller: Patient [phone] Summary of Call: Pt called req an extention on work note, still has diarrhea fever 101 and body aches. Pt is requesting extention through the end of the week. Initial call taken by: Margaret Pyle, CMA,  September 26, 2010 3:17 PM  Follow-up for Phone Call        if having fever, needs antibiotics - cipro 500mg  two times a day x 7 days - erx done - ok to extend work excuse though the rest of this week - if still having symptoms and unable to return to work on Mon 10/01/10, will need ROV to see AVP to re-eval for same - thanks Follow-up by: Newt Lukes MD,  September 26, 2010 5:55 PM  Additional Follow-up for Phone Call Additional follow up Details #1::        Pt informed, work note completed, ready for pick up, up front Additional Follow-up by: Lamar Sprinkles, CMA,  September 26, 2010 6:03 PM    New/Updated Medications: CIPROFLOXACIN HCL 500 MG TABS (CIPROFLOXACIN HCL) 1 by mouth two times a day x 7 days Prescriptions: CIPROFLOXACIN HCL 500 MG TABS (CIPROFLOXACIN HCL) 1 by mouth two times a day x 7 days  #14 x 0   Entered and Authorized by:   Newt Lukes MD   Signed by:   Newt Lukes MD on 09/26/2010   Method used:   Electronically to        [facility] (retail)       [address]       [place], [place]  [address]       Ph: [phone]       Fax: [phone]   RxID:   [id number]

## 2011-01-01 NOTE — Assessment & Plan Note (Signed)
Summary: FU Paula [name]   Vital Signs:  Patient profile:   50 year old female Weight:      218 pounds Temp:     98.6 degrees F oral Pulse rate:   70 / minute BP sitting:   116 / 82  (left arm)  Vitals Entered By: Tora Perches (February 27, 2010 1:34 PM) CC: f/u Is Patient Diabetic? No   Primary Care Provider:  Sonda Primes, MD  CC:  f/u.  History of Present Illness: C/o wt gain - not able to loose. Doing Zumba now. C/osplinters in R hand x 1 mo  Preventive Screening-Counseling & Management  Alcohol-Tobacco     Smoking Status: quit  Current Medications (verified): 1)  Ranitidine Hcl 300 Mg Tabs (Ranitidine Hcl) .[name].. 1 At [facility] Once Daily As Needed  Allergies: 1)  ! Vicodin 2)  ! Asa 3)  ! Avelox (Moxifloxacin Hcl)  Past History:  Past Medical History: Last updated: 11/22/2008 GERD  Dr Marina Goodell Irritable Bowel Syndrome TMJ Thyroid Nodule, H/O Headache ADD Depression Insomnia Hyperlipidemia Chronic neck pain OSA BPV 2008, severe Hypertension Anxiety  Social History: Last updated: 11/22/2008 Occupation: working full time - Psychologist, educational - office accounting Divorced Patient is a former smoker. -Stopped in Jan 2009 Alcohol Use - yes-on occasion Illicit Drug Use - no Patient does not get regular exercise.   Physical Exam  General:   alert, overweight-appearing.   Mouth:  Clear Lungs:  CTA Heart:  Normal rate and regular rhythm. S1 and S2 normal without gallop, murmur, click, rub or other extra sounds. Abdomen:  soft, non-tender, normal bowel sounds, no distention, and no masses.   Msk:  No deformity or scoliosis noted of thoracic or lumbar spine.   Neurologic:  No cranial nerve deficits noted. Station and gait are normal. Plantar reflexes are down-going bilaterally. DTRs are symmetrical throughout. Sensory, motor and coordinative functions appear intact. Skin:  No hives or rash    Impression & Recommendations:  Problem # 1:  HYPERGLYCEMIA  (ICD-790.29) Assessment Comment Only  See "Patient Instructions". Glucometer given - check CBG prn  Labs Reviewed: Creat: 0.9 (01/08/2010)     Problem # 2:  OBESITY, MORBID (ICD-278.01) Assessment: Improved On diet  Problem # 3:  FOREIGN BODY, HAND (ICD-914.6) R Assessment: New 2 large imbeded splinters removed w/a strait blade under sterile conditions. Tolerated well. Complicatons - none. Good pain relief following the procedure. Bandaid w/abx oint  Problem # 4:  HYPERTENSION (ICD-401.9) Assessment: Improved  BP today: 116/82 Prior BP: 132/86 (01/05/2010)  Labs Reviewed: K+: 3.9 (01/08/2010) Creat: : 0.9 (01/08/2010)   Chol: 261 (01/08/2010)   HDL: 51.40 (01/08/2010)   TG: 210.0 (01/08/2010)  Complete Medication List: 1)  Ranitidine Hcl 300 Mg Tabs (Ranitidine hcl) .[name].. 1 at hs once daily as needed 2)  Vitamin D 1000 Unit Tabs (Cholecalciferol) .[name].. 1 by mouth qd 3)  Toviaz 4 Mg Xr24h-tab (Fesoterodine fumarate) .[name].. 1 by mouth once daily prn  Patient Instructions: 1)  Cut back on carbs/sweets 2)  Please schedule a follow-up appointment in 3 months. 3)  BMP prior to visit, ICD-9: 4)  HbgA1C prior to visit, ICD-9:790.29

## 2011-01-01 NOTE — Letter (Signed)
Summary: Work [facility]  [address] [place], [place] [address]   Phone: [phone]  Fax: [phone]    Today's Date: September 25, 2010  Name of Patient: Paula [name]  The above named patient had a medical visit today 09/25/10 Please take this into consideration when reviewing the time away from work   Special Instructions:    [  ] To be off the remainder of today, returning to the normal work 09/27/10 (thursday).     Sincerely yours,   Dr. Rene Paci

## 2011-01-01 NOTE — Assessment & Plan Note (Signed)
Summary: ACHES AND PAIN/NWS  #   Vital Signs:  Patient profile:   50 year old female Height:      64 inches Weight:      212 pounds BMI:     36.52 O2 Sat:      96 % on Room air Temp:     99.0 degrees F oral Pulse rate:   77 / minute Pulse rhythm:   regular Resp:     16 per minute BP sitting:   130 / 80  (left arm) Cuff size:   large  Vitals Entered By: Lanier Prude, CMA(AAMA) (July 17, 2010 8:02 AM)  O2 Flow:  Room air CC: worsening leg aches, Rt ear pain/pressure Is Patient Diabetic? No   Primary Care Elizaveta Mattice:  Sonda Primes, MD  CC:  worsening leg aches and Rt ear pain/pressure.  History of Present Illness: The patient presents for a follow up of back pain, anxiety, depression and headaches. C/o BP 150/90. C/o R ear water. C/o L leg pain in hip and down on the side C/o acnae   Current Medications (verified): 1)  Dexilant 60 Mg Cpdr (Dexlansoprazole) .[name].. 1 By Mouth Q Am For Indigestion 2)  Vitamin B-12 500 Mcg Tabs (Cyanocobalamin) .[name].. 1 By Mouth Once Daily For Vitamin B12 Deficiency 3)  Vitamin D 1000 Unit Tabs (Cholecalciferol) .[name].. 1 By Mouth Qd 4)  Align  Caps (Probiotic Product) .[name].. 1 By Mouth Once Daily  Allergies (verified): 1)  ! Vicodin 2)  ! Asa 3)  ! Avelox (Moxifloxacin Hcl)  Past History:  Past Surgical History: Last updated: 08/30/2008 Cholecystectomy Hysterectomy C-Spine Fusion C-Section x2 Umbilical Hernia Repair bladder removal  Social History: Last updated: 11/22/2008 Occupation: working full time - Psychologist, educational - office accounting Divorced Patient is a former smoker. -Stopped in Jan 2009 Alcohol Use - yes-on occasion Illicit Drug Use - no Patient does not get regular exercise.   Past Medical History: GERD  Dr Marina Goodell Irritable Bowel Syndrome D-C TMJ Thyroid Nodule, H/O Headache ADD Depression Insomnia Hyperlipidemia Chronic neck pain OSA BPV 2008, severe Hypertension Anxiety Borderline low Vit B12  2011 GERD Acnae  Review of Systems       The patient complains of weight gain, chest pain, and abdominal pain.    Physical Exam  General:   alert, overweight-appearing.   Head:  normocephalic and atraumatic.   Nose:  WNL Mouth:  Clear Lungs:  CTA Heart:  Normal rate and regular rhythm. S1 and S2 normal without gallop, murmur, click, rub or other extra sounds. Abdomen:  soft, non-tender, normal bowel sounds, no distention, and no masses.   Msk:  No deformity or scoliosis noted of thoracic or lumbar spine.  B IT bands tender Extremities:  no edema, no erythema  Neurologic:  No cranial nerve deficits noted. Station and gait are normal. Plantar reflexes are down-going bilaterally. DTRs are symmetrical throughout. Sensory, motor and coordinative functions appear intact. Skin:  No hives Facial acnae present  Psych:  Oriented X3.  slightly anxious.     Impression & Recommendations:  Problem # 1:  LEG PAIN (ICD-729.5) L IT band Assessment New See "Patient Instructions".   Problem # 2:  DEPRESSION (ICD-311) Assessment: Improved  Problem # 3:  GERD (ICD-530.81) Assessment: Unchanged  Her updated medication list for this problem includes:    Dexilant 60 Mg Cpdr (Dexlansoprazole) .[name]... 1 by mouth q am for indigestion  Problem # 4:  HYPERTENSION (ICD-401.9) Assessment: Deteriorated  Her updated medication list  for this problem includes:    Hydrochlorothiazide 12.5 Mg Caps (Hydrochlorothiazide) .[name]... 1 by mouth once daily for blood pressure  Problem # 5:  ACNE, ROSACEA (ICD-695.3) Assessment: New Metronidazole lotion  Complete Medication List: 1)  Dexilant 60 Mg Cpdr (Dexlansoprazole) .[name].. 1 by mouth q am for indigestion 2)  Vitamin B-12 500 Mcg Tabs (Cyanocobalamin) .[name].. 1 by mouth once daily for vitamin b12 deficiency 3)  Vitamin D 1000 Unit Tabs (Cholecalciferol) .[name].. 1 by mouth qd 4)  Align Caps (Probiotic product) .[name].. 1 by mouth once daily 5)  Hydrochlorothiazide 12.5  Mg Caps (Hydrochlorothiazide) .[name].. 1 by mouth once daily for blood pressure 6)  Metronidazole 0.75 % Lotn (Metronidazole) .... On face qhs  Patient Instructions: 1)  Go on Youtube (www.youtube.com) and look up  "IT band stretch" and "gluteus stretch", . See the anatomy and learn the symptoms.  Do the stretches - it may help!  2)  Please schedule a follow-up appointment in 3 months. Prescriptions: METRONIDAZOLE 0.75 % LOTN (METRONIDAZOLE) on face qhs  #90 g x 6   Entered and Authorized by:   Tresa Garter MD   Signed by:   Tresa Garter MD on 07/17/2010   Method used:   Electronically to        [facility] (retail)       [address]       [place], [place]  [address]       Ph: [phone]       Fax: [phone]   RxID:   [id number] HYDROCHLOROTHIAZIDE 12.5 MG CAPS (HYDROCHLOROTHIAZIDE) 1 by mouth once daily for blood pressure  #30 x 12   Entered and Authorized by:   Tresa Garter MD   Signed by:   Tresa Garter MD on 07/17/2010   Method used:   Electronically to        [facility] (retail)       [address]       [place], [place]  [address]       Ph: [phone]       Fax: [phone]   RxID:   [id number]

## 2011-01-01 NOTE — Assessment & Plan Note (Signed)
Summary: sore throat/left leg pain/plot/cd   Vital Signs:  Patient profile:   50 year old female Height:      65 inches Weight:      216.75 pounds BMI:     36.20 O2 Sat:      98 % on Room air Temp:     99.5 degrees F oral Pulse rate:   74 / minute BP sitting:   142 / 86  (left arm) Cuff size:   large  Vitals Entered ByZella Ball Ewing (May 01, 2010 3:15 PM)  O2 Flow:  Room air CC: sore throat, leg and foot pain/RE   Primary Care Provider:  Sonda Primes, MD  CC:  sore throat and leg and foot pain/RE.  History of Present Illness: pt of dr plotnikov here for acute visit;  has several concerns;  c/o 6 days acute onset mild to mod ST, fever, right earache without headache, sinus pain, hearing loss, vertigo or n/v.  Pt denies CP, sob, doe, wheezing, orthopnea, pnd, worsening LE edema, palps, dizziness or syncope   Does have increased reflux symptoms wtihout dysphagia, abd pain, change in bowels, wt loss or blood.  Also c/o pain for over a week to the left lateral hip area, tender to touch and hurts to lie on the right side.  Does not cuase problem walking, and no falls , trauma.  Also c/o bilat pain to the soles of the feet, worse at the heels, worse to first get up in the am, and worse after sitting for over an hour.    Problems Prior to Update: 1)  Abdominal Pain, Epigastric  (ICD-789.06) 2)  Thrush  (ICD-112.0) 3)  Neck Pain  (ICD-723.1) 4)  Plantar Fasciitis, Bilateral  (ICD-728.71) 5)  Bursitis, Left Hip  (ICD-726.5) 6)  Pharyngitis-acute  (ICD-462) 7)  Foreign Body, Hand  (ICD-914.6) 8)  Hyperglycemia  (ICD-790.29) 9)  Pruritus  (ICD-698.9) 10)  Tobacco Use, Quit  (ICD-V15.82) 11)  Insomnia-sleep Disorder-unspec  (ICD-780.52) 12)  Anxiety  (ICD-300.00) 13)  Fatigue  (ICD-780.79) 14)  Hypertension  (ICD-401.9) 15)  Dysphagia  (ICD-787.29) 16)  Insomnia  (ICD-780.52) 17)  Esophageal Stricture  (ICD-530.3) 18)  Obesity, Morbid  (ICD-278.01) 19)  Dysphagia Unspecified   (ICD-787.20) 20)  Nausea Alone  (ICD-787.02) 21)  Headache  (ICD-784.0) 22)  Vertigo  (ICD-780.4) 23)  Diarrhea  (ICD-787.91) 24)  Hyperlipidemia  (ICD-272.4) 25)  Insomnia, Hx of  (ICD-V15.89) 26)  Depression  (ICD-311) 27)  Add  (ICD-314.00) 28)  Headache  (ICD-784.0) 29)  Thyroid Nodule  (ICD-241.0) 30)  Tmj Syndrome  (ICD-524.60) 31)  Irritable Bowel Syndrome  (ICD-564.1) 32)  Gerd  (ICD-530.81)  Medications Prior to Update: 1)  Ranitidine Hcl 300 Mg Tabs (Ranitidine Hcl) .[name].. 1 At [facility] Once Daily As Needed 2)  Vitamin D 1000 Unit Tabs (Cholecalciferol) .[name].. 1 By Mouth Qd 3)  Toviaz 4 Mg Xr24h-Tab (Fesoterodine Fumarate) .[name].. 1 By Mouth Once Daily Prn  Current Medications (verified): 1)  Vitamin D 1000 Unit Tabs (Cholecalciferol) .[name].. 1 By Mouth Qd 2)  Toviaz 4 Mg Xr24h-Tab (Fesoterodine Fumarate) .[name].. 1 By Mouth Once Daily Prn 3)  Dexilant 60 Mg Cpdr (Dexlansoprazole) .[name].. 1 By Mouth Q Am For Indigestion 4)  Oravig 50 Mg Tabs (Miconazole) .[name].. 1 Qam Under Cheek  To Melt 5)  Vitamin B-12 500 Mcg Tabs (Cyanocobalamin) .[name].. 1 By Mouth Once Daily For Vitamin B12 Deficiency  Allergies (verified): 1)  ! Vicodin 2)  ! Asa 3)  !  Avelox (Moxifloxacin Hcl)  Past History:  Past Medical History: Last updated: 11/22/2008 GERD  Dr Marina Goodell Irritable Bowel Syndrome TMJ Thyroid Nodule, H/O Headache ADD Depression Insomnia Hyperlipidemia Chronic neck pain OSA BPV 2008, severe Hypertension Anxiety  Past Surgical History: Last updated: 08/30/2008 Cholecystectomy Hysterectomy C-Spine Fusion C-Section x2 Umbilical Hernia Repair bladder removal  Social History: Last updated: 11/22/2008 Occupation: working full time - Psychologist, educational - office accounting Divorced Patient is a former smoker. -Stopped in Jan 2009 Alcohol Use - yes-on occasion Illicit Drug Use - no Patient does not get regular exercise.   Risk Factors: Exercise: no (05/27/2008)  Risk  Factors: Smoking Status: quit (02/27/2010)  Review of Systems       all otherwise negative per pt -    Physical Exam  General:  alert and overweight-appearing.   Head:  normocephalic and atraumatic.   Eyes:  vision grossly intact, pupils equal, and pupils round.   Ears:  mild bialt erythema tm's ; sinus nontender Nose:  nasal dischargemucosal pallor and mucosal edema.   Mouth:  pharyngeal erythema and pharyngeal exudate.   Neck:  supple and cervical lymphadenopathy.   Lungs:  normal respiratory effort and normal breath sounds.   Heart:  normal rate and regular rhythm.   Abdomen:  soft and normal bowel sounds.  , with mild epigastric tender, no guarding or rebound Msk:  tender left lateral hip over the greater trochanter;  also tender bilat heels without swelling or erythema Extremities:  no edema, no erythema    Impression & Recommendations:  Problem # 1:  PHARYNGITIS-ACUTE (ICD-462)  Her updated medication list for this problem includes:    Oravig 50 Mg Tabs (Miconazole) .[name]... 1 qam under cheek  to melt treat as above, f/u any worsening signs or symptoms   Problem # 2:  GERD (ICD-530.81)  Her updated medication list for this problem includes:    Dexilant 60 Mg Cpdr (Dexlansoprazole) .[name]... 1 by mouth q am for indigestion uncontrolled, to change to prilosec  two times a day   Orders: Gastroenterology Referral (GI)  Problem # 3:  BURSITIS, LEFT HIP (ICD-726.5) for prednisone pack;  declines ortho referral  Problem # 4:  PLANTAR FASCIITIS, BILATERAL (ICD-728.71) for prednisone as above, conisder podiatry - pt declines at this time  Complete Medication List: 1)  Vitamin D 1000 Unit Tabs (Cholecalciferol) .[name].. 1 by mouth qd 2)  Toviaz 4 Mg Xr24h-tab (Fesoterodine fumarate) .[name].. 1 by mouth once daily prn 3)  Dexilant 60 Mg Cpdr (Dexlansoprazole) .[name].. 1 by mouth q am for indigestion 4)  Oravig 50 Mg Tabs (Miconazole) .[name].. 1 qam under cheek  to melt 5)  Vitamin B-12 500 Mcg  Tabs (Cyanocobalamin) .[name].. 1 by mouth once daily for vitamin b12 deficiency  Patient Instructions: 1)  Please take all new medications as prescribed - the antibiotic, generic prilosec, and the prednisone 2)  Continue all previous medications as before this visit  3)  , except stop the ranitidine 4)  Continue all previous medications as before this visit  5)  You will be contacted about the referral(s) to: Dr Marina Goodell - GI 6)  Please schedule a follow-up appointment as needed, with primary MD Prescriptions: PREDNISONE 10 MG TABS (PREDNISONE) 4po qd for 3days, then 3po qd for 3days, then 2po qd for 3days, then 1po qd for 3 days, then stop  #30 x 0   Entered and Authorized by:   Corwin Levins MD   Signed by:   Fayrene Fearing  Ellin Mayhew MD on 05/01/2010   Method used:   Print then Give to Patient   RxID:   [id number] CEPHALEXIN 500 MG CAPS (CEPHALEXIN) 1 by mouth three times a day  #30 x 0   Entered and Authorized by:   Corwin Levins MD   Signed by:   Corwin Levins MD on 05/01/2010   Method used:   Print then Give to Patient   RxID:   [id number] OMEPRAZOLE 20 MG CPDR (OMEPRAZOLE) 1 by mouth two times a day  #60 x 11   Entered and Authorized by:   Corwin Levins MD   Signed by:   Corwin Levins MD on 05/01/2010   Method used:   Print then Give to Patient   RxID:   [id number]

## 2011-01-01 NOTE — Assessment & Plan Note (Signed)
Summary: GI distress and GERD   History of Present Illness Visit Type: Follow-up Consult Primary GI MD: Yancey Flemings MD Primary Provider: Sonda Primes, MD Requesting Provider: Oliver Barre, MD Chief Complaint: Pt states she has several episodes of Nausea and Diarrhea. Pt states she has not having any diarrhea right now but has a episode this past weekend. Pt has constant upper abd pain.  History of Present Illness:   50 year old female with history of hypertension, anxiety/depression, hyperlipidemia, obesity, GERD, chronic functional abdominal pain, and irritable bowel syndrome. She is accompanied by her friend, [name]. She continues with chronic upper abdominal pain and intermittent nausea without change. She states that she was in her usual state of health until early June when after having consumed a milkshake, she developed problems with nausea vomiting diarrhea chills and fever. She was seen at the emergency room and diagnosed with gastroenteritis. She lost 6 pounds in 9 days. She was evaluated by Dr. Posey Rea June 9. CBC, comprehensive metabolic panel, serum lipase, and TSH were all normal. Urinalysis and sedimentation rate were unremarkable. Her omeprazole was changed to Dexilant. She thinks this has helped her GERD symptoms though incompletely. No dysphagia. Gradually her symptoms have improved. Her chief complaint now is occasional queasiness, mild diarrhea, and bloating. She has gained her weight back. She did undergo completely colonoscopy and upper endoscopy in June of 2005. Colonoscopy including intubation of the ileum was unremarkable except for internal hemorrhoids. Upper endoscopy was normal except for an incidental esophageal ring. She request Dexilant samples.   GI Review of Systems    Reports abdominal pain, acid reflux, bloating, and  nausea.     Location of  Abdominal pain: upper abdomen.    Denies belching, chest pain, dysphagia with liquids, dysphagia with solids, heartburn,  loss of appetite, vomiting, vomiting blood, weight loss, and  weight gain.      Reports irritable bowel syndrome.     Denies anal fissure, black tarry stools, change in bowel habit, constipation, diarrhea, diverticulosis, fecal incontinence, heme positive stool, hemorrhoids, jaundice, light color stool, liver problems, rectal bleeding, and  rectal pain.    Current Medications (verified): 1)  Dexilant 60 Mg Cpdr (Dexlansoprazole) .[name].. 1 By Mouth Q Am For Indigestion 2)  Vitamin B-12 500 Mcg Tabs (Cyanocobalamin) .[name].. 1 By Mouth Once Daily For Vitamin B12 Deficiency 3)  Vitamin D 1000 Unit Tabs (Cholecalciferol) .[name].. 1 By Mouth Qd  Allergies (verified): 1)  ! Vicodin 2)  ! Asa 3)  ! Avelox (Moxifloxacin Hcl)  Past History:  Past Medical History: Reviewed history from 05/25/2010 and no changes required. GERD  Dr Marina Goodell Irritable Bowel Syndrome D-C TMJ Thyroid Nodule, H/O Headache ADD Depression Insomnia Hyperlipidemia Chronic neck pain OSA BPV 2008, severe Hypertension Anxiety Borderline low Vit B12 2011  Past Surgical History: Reviewed history from 08/30/2008 and no changes required. Cholecystectomy Hysterectomy C-Spine Fusion C-Section x2 Umbilical Hernia Repair bladder removal  Family History: Reviewed history from 05/27/2008 and no changes required. Family History Hypertension Family History of Prostate Cancer: Grandfather No FH of Colon Cancer  Social History: Reviewed history from 11/22/2008 and no changes required. 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 -Stopped in Jan 2009 Alcohol Use - yes-on occasion Illicit Drug Use - no Patient does not get regular exercise.   Review of Systems       The patient complains of fatigue.  The patient denies allergy/sinus, anemia, anxiety-new, arthritis/joint pain, back pain, blood  in urine, breast changes/lumps, change in vision, confusion, cough,  coughing up blood, depression-new, fainting, fever, headaches-new, hearing problems, heart murmur, heart rhythm changes, itching, menstrual pain, muscle pains/cramps, night sweats, nosebleeds, pregnancy symptoms, shortness of breath, skin rash, sleeping problems, sore throat, swelling of feet/legs, swollen lymph glands, thirst - excessive , urination - excessive , urination changes/pain, urine leakage, vision changes, and voice change.    Vital Signs:  Patient profile:   50 year old female Height:      64 inches Weight:      211 pounds BMI:     36.35 Pulse rate:   70 / minute Pulse rhythm:   regular BP sitting:   128 / 84  (left arm) Cuff size:   large  Vitals Entered By: Christie Nottingham CMA Duncan Dull) (June 06, 2010 9:38 AM)  Physical Exam  General:  Well developed, well nourished, no acute distress. Head:  Normocephalic and atraumatic. Eyes:  PERRLA, no icterus. Ears:  Normal auditory acuity. Nose:  No deformity, discharge,  or lesions. Mouth:  No deformity or lesions, dentition normal. Neck:  Supple; no masses or thyromegaly. Lungs:  Clear throughout to auscultation. Heart:  Regular rate and rhythm; no murmurs, rubs,  or bruits. Abdomen:  Soft, obese,nontender and nondistended. No masses, hepatosplenomegaly or hernias noted. Normal bowel sounds. Msk:  Symmetrical with no gross deformities. Normal posture. Pulses:  Normal pulses noted. Extremities:  no edema Neurologic:  Alert and  oriented x4   Impression & Recommendations:  Problem # 1:  GASTROENTERITIS (ICD-558.9) recent problems with acute nausea and vomiting associated with diarrhea chills and fever most consistent with acute gastroenteritis, likely viral. Gradual but ongoing improvement with time. Not unusual for slower improvement in IBS patient's. No worrisome features. Recommended a two-week course of probiotic therapy. Samples of Align given. Reassurance. Followup as needed.  Problem # 2:  IRRITABLE BOWEL SYNDROME  (ICD-564.1) ongoing. May have been exacerbated by gastroenteritis. A trial of probiotics as above  Problem # 3:  GERD (ICD-530.81) ongoing. She does be doing better with excellent.  Plan #1 Reflux precautions #2. Continue Dexilant. Additional samples given per her request #3. Follow up p.r.n.  Patient Instructions: 1)  Dexilant samples given to patient take 1 by mouth 30 minutes before breakfast once daily 2)  Align samples given to patient to take 1 by mouth once daily x 2 weeks. 3)  Copy sent to : Sonda Primes M.D. 4)  Please schedule a follow-up appointment as needed.  5)  The medication list was reviewed and reconciled.  All changed / newly prescribed medications were explained.  A complete medication list was provided to the patient / caregiver.

## 2011-01-01 NOTE — Assessment & Plan Note (Signed)
Summary: SORE THROAT/NWS   Vital Signs:  Patient profile:   50 year old female Height:      64 inches Weight:      215 pounds BMI:     37.04 O2 Sat:      97 % on Room air Temp:     99.2 degrees F oral Pulse rate:   70 / minute Pulse rhythm:   regular Resp:     16 per minute BP sitting:   140 / 70  (left arm) Cuff size:   large  Vitals Entered By: Lanier Prude, CMA(AAMA) (July 30, 2010 9:18 AM)  O2 Flow:  Room air CC: Fever, sore throat, ear pain, aches X 3 days Is Patient Diabetic? No   Primary Care Provider:  Sonda Primes, MD  CC:  Fever, sore throat, ear pain, and aches X 3 days.  History of Present Illness: The patient presents with complaints of sore throat, fever, cough, sinus congestion and drainge of several days duration. Not better with OTC meds. Chest hurts with coughing. Can't sleep due to cough. Muscle aches are present.  The mucus is colored. Hoarse...  Current Medications (verified): 1)  Dexilant 60 Mg Cpdr (Dexlansoprazole) .[name].. 1 By Mouth Q Am For Indigestion 2)  Vitamin B-12 500 Mcg Tabs (Cyanocobalamin) .[name].. 1 By Mouth Once Daily For Vitamin B12 Deficiency 3)  Vitamin D 1000 Unit Tabs (Cholecalciferol) .[name].. 1 By Mouth Qd 4)  Align  Caps (Probiotic Product) .[name].. 1 By Mouth Once Daily 5)  Hydrochlorothiazide 12.5 Mg Caps (Hydrochlorothiazide) .[name].. 1 By Mouth Once Daily For Blood Pressure 6)  Metronidazole 0.75 % Lotn (Metronidazole) .... On Face Qhs  Allergies (verified): 1)  ! Vicodin 2)  ! Asa 3)  ! 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 -Stopped in Jan 2009 Alcohol Use - yes-on occasion Illicit Drug Use - no Patient does not get regular exercise.   Review of Systems       The patient complains of fever, chest pain, and melena.  The patient denies dyspnea on exertion.    Physical Exam  General:   alert, overweight-appearing.  Hoarse Ears:  External ear exam shows no significant lesions or deformities.  Otoscopic examination reveals clear canals, tympanic membranes are intact bilaterally without bulging, retraction, inflammation or discharge. Hearing is grossly normal bilaterally. Mouth:  Erythematous throat and intranasal mucosa c/w URI  Neck:  supple, almost full ROM, no masses, no JVD, no cervical lymphadenopathy, and some anterior neck tenderness.   Lungs:  Normal respiratory effort, chest expands symmetrically. Lungs are clear to auscultation, no crackles or wheezes. Heart:  Normal rate and regular rhythm. S1 and S2 normal without gallop, murmur, click, rub or other extra sounds. Abdomen:  soft, non-tender, normal bowel sounds, no distention, and no masses.   Skin:  No hives Facial acnae present  Psych:  Oriented X3.  slightly anxious.     Impression & Recommendations:  Problem # 1:  SINUSITIS, ACUTE (ICD-461.9) Assessment New  Her updated medication list for this problem includes:    Promethazine-codeine 6.25-10 Mg/24ml Syrp (Promethazine-codeine) .[name]... 5-10 ml by mouth q id as needed  cough    Zithromax Z-pak 250 Mg Tabs (Azithromycin) .[name]... As dirrected Call if you are not better in a reasonable amount of time or if worse. Go to ER if feeling really bad!  Orders: ENT Referral (ENT)  Problem # 2:  LARYNGITIS, ACUTE (ICD-464.00) Assessment: New See "Patient Instructions".  Orders: ENT Referral (ENT)  Problem # 3:  COUGH (ICD-786.2) due to #1 Assessment: New  Orders: T-2 View CXR, Same Day (71020.5TC)  Complete Medication List: 1)  Dexilant 60 Mg Cpdr (Dexlansoprazole) .[name].. 1 by mouth  q am for indigestion 2)  Vitamin B-12 500 Mcg Tabs (Cyanocobalamin) .[name].. 1 by mouth once daily for vitamin b12 deficiency 3)  Vitamin D 1000 Unit Tabs (Cholecalciferol) .[name].. 1 by mouth qd 4)  Align Caps (Probiotic product) .[name].. 1 by mouth once daily 5)  Hydrochlorothiazide 12.5 Mg Caps (Hydrochlorothiazide) .[name].. 1 by mouth once daily for blood pressure 6)  Metronidazole 0.75 % Lotn (Metronidazole) .... On face qhs 7)  Promethazine-codeine 6.25-10 Mg/43ml Syrp (Promethazine-codeine) .... 5-10 ml by mouth q id as needed cough 8)  Zithromax Z-pak 250 Mg Tabs (Azithromycin) .... As dirrected  Patient Instructions: 1)  Voice rest 2)  Call if you are not better in a reasonable amount of time or if worse. Go to ER if feeling really bad!  Prescriptions: ZITHROMAX Z-PAK 250 MG TABS (AZITHROMYCIN) as dirrected  #1 x 0   Entered and Authorized by:   Tresa Garter MD   Signed by:   Tresa Garter MD on 07/30/2010   Method used:   Print then Give to Patient   RxID:   [id number] PROMETHAZINE-CODEINE 6.25-10 MG/5ML SYRP (PROMETHAZINE-CODEINE) 5-10 ml by mouth q id as needed cough  #300 ml x 0   Entered and Authorized by:   Tresa Garter MD   Signed by:   Tresa Garter MD on 07/30/2010   Method used:   Print then Give to Patient   RxID:   [id number]

## 2011-01-01 NOTE — Assessment & Plan Note (Signed)
Summary: post ER / elev bp / #/cd   Vital Signs:  Patient profile:   50 year old female Weight:      219 pounds Temp:     98.9 degrees F oral Pulse rate:   78 / minute BP sitting:   132 / 86  (left arm)  Vitals Entered By: Tora Perches (January 05, 2010 10:30 AM) CC: er f/u for elevated bp Is Patient Diabetic? No CBG Result 128   Primary Care Provider:  Sonda Primes, MD  CC:  er f/u for elevated bp.  History of Present Illness: C/o HA with a light "aura" w/a HA 3 wks ago x hrs C/o stress related HAs lately, anxiety  Preventive Screening-Counseling & Management  Alcohol-Tobacco     Smoking Status: quit  Current Medications (verified): 1)  Dexilant 60 Mg Cpdr (Dexlansoprazole) .... Once Daily 2)  Ranitidine Hcl 300 Mg Tabs (Ranitidine Hcl) .[name].. 1 At [facility] Once Daily As Needed 3)  Lexapro 10 Mg Tabs (Escitalopram Oxalate) .[name].. 1 By Mouth Once Daily 4)  Alprazolam 0.5 Mg Tabs (Alprazolam) .[name].. 1 By Mouth Three Times A Day As Needed Nerves 5)  Prednisone 10 Mg Tabs (Prednisone) .... Take 40mg  Qd For 3 Days, Then 20 Mg Qd For 3 Days, Then 10mg  Qd For 6 Days, Then Stop. Take Pc. 6)  Hydroxyzine Hcl 25 Mg Tabs (Hydroxyzine Hcl) .[name].. 1-2 Tabs By Mouth Two Times A Day As Needed Itching  Allergies: 1)  ! Vicodin 2)  ! Asa 3)  ! Avelox (Moxifloxacin Hcl)  Physical Exam  General:   alert, overweight-appearing.   Eyes:  No corneal or conjunctival inflammation noted. EOMI. Perrla. Ears:  R ear normal and L ear normal.   Nose:  WNL Mouth:  Clear Lungs:  CTA Heart:  RRR Neurologic:  No cranial nerve deficits noted. Station and gait are normal. Plantar reflexes are down-going bilaterally. DTRs are symmetrical throughout. Sensory, motor and coordinative functions appear intact. Skin:  No hives or rash  Psych:  Oriented X3.     Impression & Recommendations:  Problem # 1:  HEADACHE (ICD-784.0) - migraine Assessment Deteriorated  Her updated medication list for this problem  includes:    Maxalt-mlt 10 Mg Tbdp (Rizatriptan benzoate) .[name]... 1 by mouth once daily as needed migraine    Fioricet 50-325-40 Mg Tabs (Butalbital-apap-caffeine) .[name]... 1-2 by mouth two times a day as needed migraine or tension ha  Problem # 2:  HYPERGLYCEMIA (ICD-790.29) Assessment: Comment Only Wt loss and diet  Problem # 3:  HEADACHE (ICD-784.0) Assessment: Deteriorated  Her updated medication list for this problem includes:    Maxalt-mlt 10 Mg Tbdp (Rizatriptan benzoate) .[name]... 1 by mouth once daily as needed migraine    Fioricet 50-325-40 Mg Tabs (Butalbital-apap-caffeine) .[name]... 1-2 by mouth two times a day as needed migraine or tension ha  Complete Medication List: 1)  Ranitidine Hcl 300 Mg Tabs (Ranitidine hcl) .[name].. 1 at hs once daily as needed 2)  Lexapro 10 Mg Tabs (Escitalopram oxalate) .[name].. 1 by mouth once daily 3)  Alprazolam 0.5 Mg Tabs (Alprazolam) .[name].. 1 by mouth three times a day as needed nerves 4)  Maxalt-mlt 10 Mg Tbdp (Rizatriptan benzoate) .[name].. 1 by mouth once daily as needed migraine 5)  Fioricet 50-325-40 Mg Tabs (Butalbital-apap-caffeine) .[name].. 1-2 by mouth two times a day as needed migraine or tension ha  Patient Instructions: 1)  Dark Lindtt 70-90 % cocoa choclate 2)  It is important that you exercise regularly  at least 20 minutes 5 times a week. If you develop chest pain, have severe difficulty breathing, or feel very tired , stop exercising immediately and seek medical attention. 3)  You need to lose weight. Consider a lower calorie diet and regular exercise.  4)  BMP prior to visit, ICD-9: 5)  Lipid Panel prior to visit, ICD-9: 6)  TSH prior to visit, ICD-9: 7)  HbgA1C prior to visit, ICD-9: 8)  Vit B12  780.79 9)  Try to eat more raw plant food, fresh and dry fruit, raw almonds, leafy vegetables, whole foods and less red meat, less animal fat. Poultry and fish is better for you than pork and beef. Avoid processed foods (canned soups, hot dogs, sausage, bacon  , frozen dinners). Avoid corn syrup, high fructose syrup or aspartam and Splenda  containing drinks. Honey, Agave and Stevia are better sweeteners. Make your own  dressing with olive oil, wine vinegar, lemon juce, garlic etc. for your salads.  Prescriptions: FIORICET 50-325-40 MG TABS (BUTALBITAL-APAP-CAFFEINE) 1-2 by mouth two times a day as needed migraine or tension HA  #60 x 2   Entered and Authorized by:   Tresa Garter MD   Signed by:   Tresa Garter MD on 01/05/2010   Method used:   Print then Give to Patient   RxID:   [id number]

## 2011-01-01 NOTE — Letter (Signed)
Summary: Out of Work  [facility]  [address] [place], [place] [address]   Phone: [phone]  Fax: [phone]    September 26, 2010   Employee:  TANNA LOEFFLER    To Whom It May Concern:   For Medical reasons, please excuse the above named employee from work for the following dates:  09/25/2010 To 09/28/2010   If you need additional information, please feel free to contact our office.         Sincerely,    Lamar Sprinkles, CMA (AAMA) For Dr A. Plotnikov

## 2011-01-01 NOTE — Assessment & Plan Note (Signed)
Summary: 3 MO ROV /NWS   Vital Signs:  Patient profile:   50 year old female Height:      64 inches Weight:      212 pounds BMI:     36.52 O2 Sat:      96 % on Room air Temp:     99.8 degrees F oral Resp:     16 per minute BP sitting:   140 / 100  (left arm) Cuff size:   large  Vitals Entered By: Lanier Prude, CMA(AAMA) (May 25, 2010 2:09 PM)  O2 Flow:  Room air CC: 2 wk f/u diarrhea Comments pt is not taking vitamin d 1000u and her vit b12 is in a liquid form not tablet.   Primary Care Provider:  Sonda Primes, MD  CC:  2 wk f/u diarrhea.  History of Present Illness: F/u thrush and elev. BP Doing better  Current Medications (verified): 1)  Vitamin D 1000 Unit Tabs (Cholecalciferol) .[name].. 1 By Mouth Qd 2)  Toviaz 4 Mg Xr24h-Tab (Fesoterodine Fumarate) .[name].. 1 By Mouth Once Daily Prn 3)  Dexilant 60 Mg Cpdr (Dexlansoprazole) .[name].. 1 By Mouth Q Am For Indigestion 4)  Oravig 50 Mg Tabs (Miconazole) .[name].. 1 Qam Under Cheek  To Melt 5)  Vitamin B-12 500 Mcg Tabs (Cyanocobalamin) .[name].. 1 By Mouth Once Daily For Vitamin B12 Deficiency  Allergies (verified): 1)  ! Vicodin 2)  ! Asa 3)  ! Avelox (Moxifloxacin Hcl)  Past History:  Social History: Last updated: 11/22/2008 Occupation: working full time - Psychologist, educational - office accounting Divorced Patient is a former smoker. -Stopped in Jan 2009 Alcohol Use - yes-on occasion Illicit Drug Use - no Patient does not get regular exercise.   Past Medical History: GERD  Dr Marina Goodell Irritable Bowel Syndrome D-C TMJ Thyroid Nodule, H/O Headache ADD Depression Insomnia Hyperlipidemia Chronic neck pain OSA BPV 2008, severe Hypertension Anxiety Borderline low Vit B12 2011  Review of Systems  The patient denies fever, chest pain, dyspnea on exertion, and abdominal pain.    Physical Exam  General:   alert, overweight-appearing.   Nose:  WNL Mouth:  Clear Neck:  supple, almost full ROM, no masses, no JVD, no  cervical lymphadenopathy, and some anterior neck tenderness.   Lungs:  CTA Heart:  Normal rate and regular rhythm. S1 and S2 normal without gallop, murmur, click, rub or other extra sounds. Abdomen:  soft, non-tender, normal bowel sounds, no distention, and no masses.   Msk:  No deformity or scoliosis noted of thoracic or lumbar spine.   Extremities:  no edema, no erythema  Neurologic:  No cranial nerve deficits noted. Station and gait are normal. Plantar reflexes are down-going bilaterally. DTRs are symmetrical throughout. Sensory, motor and coordinative functions appear intact. Skin:  No hives or rash    Impression & Recommendations:  Problem # 1:  HYPERTENSION (ICD-401.9) Assessment Comment Only States BP is  ok at home  Problem # 2:  THRUSH (ICD-112.0) resolved Assessment: Improved  Problem # 3:  ABDOMINAL PAIN, EPIGASTRIC (ICD-789.06) resolved Assessment: Improved  Problem # 4:  ANXIETY (ICD-300.00) Assessment: Unchanged  Complete Medication List: 1)  Toviaz 4 Mg Xr24h-tab (Fesoterodine fumarate) .[name].. 1 by mouth once daily prn 2)  Dexilant 60 Mg Cpdr (Dexlansoprazole) .[name].. 1 by mouth q am for indigestion 3)  Oravig 50 Mg Tabs (Miconazole) .[name].. 1 qam under cheek  to melt 4)  Vitamin B-12 500 Mcg Tabs (Cyanocobalamin) .[name].. 1 by mouth once daily for  vitamin b12 deficiency 5)  Vitamin D 1000 Unit Tabs (Cholecalciferol) .[name].. 1 by mouth qd  Patient Instructions: 1)  Please schedule a follow-up appointment in 4 months. 2)  Dry Gluten free or low gluten diet

## 2011-01-04 ENCOUNTER — Other Ambulatory Visit: Payer: Self-pay | Admitting: Internal Medicine

## 2011-01-04 ENCOUNTER — Ambulatory Visit: Admit: 2011-01-04 | Payer: Self-pay | Admitting: Internal Medicine

## 2011-01-23 NOTE — Procedures (Signed)
Summary: EGD/[facility]  EGD/[facility]   Imported By: Sherian Rein 01/15/2011 14:21:21  _____________________________________________________________________  External Attachment:    Type:   Image     Comment:   External Document

## 2011-01-23 NOTE — Procedures (Signed)
Summary: [facility]  [facility]   Imported By: Sherian Rein 01/15/2011 14:23:18  _____________________________________________________________________  External Attachment:    Type:   Image     Comment:   External Document

## 2011-01-23 NOTE — Procedures (Signed)
Summary: ERCP/[place]  ERCP/[place]   Imported By: Sherian Rein 01/15/2011 14:18:04  _____________________________________________________________________  External Attachment:    Type:   Image     Comment:   External Document

## 2011-01-23 NOTE — Procedures (Signed)
Summary: Colonoscopy/[place]  Colonoscopy/[place]   Imported By: Sherian Rein 01/15/2011 14:16:02  _____________________________________________________________________  External Attachment:    Type:   Image     Comment:   External Document

## 2011-02-18 LAB — URINE CULTURE: Colony Count: >=100000

## 2011-02-18 LAB — DIFFERENTIAL
Basophils Absolute: 0.1 10*3/uL (ref 0.0–0.1)
Eosinophils Absolute: 0.3 10*3/uL (ref 0.0–0.7)
Eosinophils Relative: 2 % (ref 0–5)
Lymphocytes Relative: 25 % (ref 12–46)
Lymphs Abs: 3.5 10*3/uL (ref 0.7–4.0)
Monocytes Absolute: 0.6 10*3/uL (ref 0.1–1.0)
Neutro Abs: 9.6 10*3/uL — ABNORMAL HIGH (ref 1.7–7.7)

## 2011-02-18 LAB — URINALYSIS, ROUTINE W REFLEX MICROSCOPIC
Hgb urine dipstick: NEGATIVE
Ketones, ur: NEGATIVE mg/dL
Nitrite: NEGATIVE
Protein, ur: NEGATIVE mg/dL

## 2011-02-18 LAB — BASIC METABOLIC PANEL
BUN: 10 mg/dL (ref 6–23)
CO2: 23 mEq/L (ref 19–32)
Chloride: 105 mEq/L (ref 96–112)
Creatinine, Ser: 0.73 mg/dL (ref 0.4–1.2)
GFR calc Af Amer: >60 mL/min (ref >60)
Glucose, Bld: 98 mg/dL (ref 70–99)
Potassium: 4 mEq/L (ref 3.5–5.1)
Sodium: 137 mEq/L (ref 135–145)

## 2011-02-18 LAB — CBC
MCHC: 33.1 g/dL (ref 30.0–36.0)
RBC: 4.26 MIL/uL (ref 3.87–5.11)
RDW: 12.8 % (ref 11.5–15.5)

## 2011-03-30 ENCOUNTER — Inpatient Hospital Stay (INDEPENDENT_AMBULATORY_CARE_PROVIDER_SITE_OTHER)
Admission: RE | Admit: 2011-03-30 | Discharge: 2011-03-30 | Disposition: A | Discharge status: Home / Self Care | Payer: Self-pay | Source: Ambulatory Visit | Attending: Emergency Medicine | Admitting: Emergency Medicine

## 2011-03-30 ENCOUNTER — Ambulatory Visit (INDEPENDENT_AMBULATORY_CARE_PROVIDER_SITE_OTHER): Payer: Self-pay

## 2011-03-30 DIAGNOSIS — N390 Urinary tract infection, site not specified: Secondary | ICD-10-CM

## 2011-03-30 DIAGNOSIS — R109 Unspecified abdominal pain: Secondary | ICD-10-CM

## 2011-03-30 LAB — COMPREHENSIVE METABOLIC PANEL
AST: 26 U/L (ref 0–37)
CO2: 25 mEq/L (ref 19–32)
Calcium: 9.7 mg/dL (ref 8.4–10.5)
Creatinine, Ser: 0.68 mg/dL (ref 0.4–1.2)
GFR calc Af Amer: >60 mL/min (ref >60)
GFR calc non Af Amer: >60 mL/min (ref >60)

## 2011-03-30 LAB — POCT URINALYSIS DIP (DEVICE)
Bilirubin Urine: NEGATIVE
Glucose, UA: NEGATIVE mg/dL
pH: 5.5 (ref 5.0–8.0)

## 2011-03-30 LAB — LIPASE, BLOOD: Lipase: 23 U/L (ref 11–59)

## 2011-04-01 LAB — OCCULT BLOOD, POC DEVICE: Fecal Occult Bld: NEGATIVE

## 2011-04-16 NOTE — Consult Note (Signed)
NAMEMarland Kitchen  SANTASIA, REW NO.:  [id number]   MEDICAL RECORD NO.:  [id number]          PATIENT TYPE:  INP   LOCATION:  1825                         FACILITY:  [facility]   PHYSICIAN:  Melvyn Novas, M.D.  DATE OF BIRTH:  07/03/1961   DATE OF CONSULTATION:  06/14/2007  DATE OF DISCHARGE:                                 CONSULTATION   Paula [name] presented with a 24-hour history of acute vertigo with a  chief complaint of nausea and photophobia and headaches.  She is a  pleasant 50 year old African American mildly obese female who has a  history of migraine headaches but states that she has not needed any  p.r.n. treatment since 2006.  At the time, she also had depression and  insomnia she states, and she has not had the same triggers for headaches  since.  She also suffers from GERD.  She had a history of stomach  ulcers.  She had undergone a cervical fusion, a cholecystectomy, and has  had two cesarean sections.   The patient presented today with a history of vertigo, onset left last  noon/early afternoon.  Overnight she felt that she could no longer keep  her equilibrium and had trouble walking.  She came here because the  associated headache bothered her terribly.  She also had noticed  blurring of vision on the left eye only.  She appears now severely  nauseated.  She still has some diplopia, and she feels better when she  closes her left eye and just looks with her right.  Her left eye she  states was dancing around.  She also had diplopia and severe headaches  with this photophobia at the time of the onset of the vertigo yesterday.   PAST MEDICAL HISTORY:  See above.   FAMILY HISTORY:  Maternal grandfather had lung cancer.   SOCIAL HISTORY:  The patient is a nonsmoker.  Social alcohol drinker.  She is here in the presence of her mother.  She has two healthy  children.   MEDICATIONS:  1. Zantac.  2. Prilosec p.r.n.  3. Maxalt p.r.n., last taken in 2006.  4. She is now ordered Zofran, nasal O2, and Meclizine by Dr. Alwyn Ren as      well as Protonix for the hospital stay.   ALLERGIES:  SHE LISTS AND ALLERGY TO ASPIRIN BUT CLARIFIED THAT SHE HAD  DEVELOPED HER STOMACH ULCERS UNDER ASPIRIN USE.  SHE ALSO GETS NAUSEATED  WITH CODEINE.   DIET:  She is not on any specific diet.   LABORATORY DATA:  Her Chem-7 shows a mild decrease in potassium at 3.4.  She has a normal H&H, a normal glucose level of 109.   PHYSICAL EXAMINATION:  VITAL SIGNS:  The patient's vital signs are  stable.  She is febrile.  Heart rate 88, respiratory rate 16.  She has  regular normal sinus rhythm.  Blood pressure 115/80, with a temperature  of 98 degrees Fahrenheit.  LUNGS:  Clear to auscultation.  CARDIAC:  Regular rate and rhythm.  No murmur.  NECK:  No carotid bruit, no temporal artery induration.  She  does have  nuchal pain but no rigidity.  Flexion and rotation of the head worsens  her vertigo.  EXTREMITIES:  There is no clubbing, cyanosis.  No rash.  Mallampati is  grade B with a midline tongue and uvula.  NEUROLOGIC:  Mental status:  She is drowsy after being given  medications, but she has no dysarthria, aphasia, or dyspraxia.  Cranial  nerve examination shows full visual fields in monocular examination.  When binocular extraocular movements are observed,  the patient seems to  have trouble focusing with the left.  She also describes that she has  more blurring on the left side.  There is no other facial asymmetry, and  photophobia is not associated with any retinopathy or optic nerve  swelling.  Motor examination shows equal tone, strength, and mass and  equal deep tendon reflexes with downgoing toes bilaterally.  Sensory is  intact to primary modalities.  Finger-to-nose test shows no dysmetria.  She can also follow repeat alternating movements.   ASSESSMENT:  Acute vertigo with left or focal trigger associated with  headaches and vision blurring on the  left.  This could be a complicated  migraine, ICD code is 346.11, but we need to rule out a brainstem or  cerebellar vascular involvement.  An MRI at this time is pending.  The  patient has just received another dose of benzodiazepines and Zofran and  will be evaluated soon.  If the MRI is negative, I would treat her  conservatively and also consider a vestibular rehabilitation.  If the  MRI is positive, she will be transferred to the stroke service as a  consult patient.      Melvyn Novas, M.D.  Electronically Signed     CD/MEDQ  D:  06/14/2007  T:  06/15/2007  Job:  [id number]   cc:   Titus Dubin. Alwyn Ren, MD,FACP,FCCP

## 2011-04-16 NOTE — H&P (Signed)
NAMEMarland Kitchen  TREANNA, DUMLER NO.:  [id number]   MEDICAL RECORD NO.:  [id number]          PATIENT TYPE:  INP   LOCATION:  4742                         FACILITY:  [facility]   PHYSICIAN:  Paula Dubin. Hopper, MD,FACP,FCCPDATE OF BIRTH:  [birth year]   DATE OF ADMISSION:  06/14/2007  DATE OF DISCHARGE:                              HISTORY & PHYSICAL   Private patient of Dr. Sonda Primes   Paula [name] is a 50 year old African-American female admitted with  intractable vertigo and nausea and vomiting.   She was in her usual state of health on July 12, but awoke 10:30 p.m.  with profound vertigo.  She attempted to ambulate, but fell to the  ground and had to crawl to the bathroom.  She subsequently had nausea  and vomiting x7.   In the emergency room, Valium and Reglan provided no relief.  Benadryl  in the context of the prior medications did result in some benefit.   By history she has had migraines; the most recent episode was in 2006.  She was actually treated in the emergency room for migraines in [place]  on one occasion.  She does not take any prophylaxis for migraines.   PAST MEDICAL HISTORY:  Includes:  1. Hernia as a child.  2. She has had two C-sections.  3. She has also had a total abdominal hysterectomy; ovaries remain.  4. She has had a cholecystectomy.  5. She states that she has had the disc removed from C5-6, 7 and      fusion performed by Dr. Franky Macho, neurosurgeon.  She has not seen a      neurologist in [place].   She drinks socially and does not smoke.   SHE IS INTOLERANT TO ASPIRIN BECAUSE OF PREVIOUS HISTORY OF DYSPEPSIA.   FAMILY HISTORY:  Negative for hypertension, heart attack, stroke of  diabetes.  A grandfather had cancer of the lung or liver.   She is on Zantac 300 mg daily.   She personally describes a headache from the maxillary sinus area  bilaterally and in the frontal area.  Additionally, she is having pain  in her cervical spine as  well as in the right lateral neck.  This did  respond to the pain medicine.   She states she is unable to open her eyes because of the vertigo; she  denies any definite visual loss or change.   She did have some congestion on July 10 with a scratchy sore throat and  itching of her ears for which she was treated by the nurse at work.  She  denies tinnitus or hearing loss.   She denies chest pain or palpitations at this time.  She has had no  cough or sputum production.   She has had some abdominal discomfort following the nausea and vomiting,  but none prior to the vomiting.   She has had no genitourinary symptoms.   She has no localizing neurologic findings by history such as weakness or  numbness or tingling by history.   GENERAL:  On exam, she keeps her eyes tightly closed and lies in a left  lateral decubitus position.  She moves cautiously, but can rotate into  the supine position.  VITAL SIGNS:  Pulse is 71, blood pressure 128/75 and respiratory rate  25.  HEENT:  She declines to open her eyes because of the symptoms noted  above.  She does have increased cerumen of the canal on the right.  The  left tympanic membrane is clear.  Nares are somewhat boggy and  erythematous.  Oropharynx is unremarkable; dental hygiene is good.  NECK:  She is slightly tender in the right neck; no lymphadenopathy is  palpable.  She has no carotid bruits.  Thyroid is normal to palpation.  CHEST:  Clear with no increased work of breathing.  HEART:  There is a suggestion of an S4 versus a click.  ABDOMEN:  Reveals active bowel sounds; there was no localizing  tenderness and no masses are noted.  EXTREMITIES:  Pedal pulses are intact.  NEUROLOGICAL:  Strength is good and equal bilaterally.  Grip on the left  could not be tested adequately, as she has an IV and attempting to grip  causes pain in her hand.   She will be admitted to telemetry and will receive parenteral meds.  Neurology consult  will be pursued because of the severity of her  symptoms.      Paula Dubin. Alwyn Ren, MD,FACP,FCCP  Electronically Signed     [facility]/MEDQ  D:  06/14/2007  T:  06/14/2007  Job:  [id number]

## 2011-04-16 NOTE — Discharge Summary (Signed)
NAME:  Paula, [name] NO.:  [id number]   MEDICAL RECORD NO.:  [id number]          PATIENT TYPE:  INP   LOCATION:  4742                         FACILITY:  [facility]   PHYSICIAN:  Paula [name], MDDATE OF BIRTH:  23-Oct-1961   DATE OF ADMISSION:  06/14/2007  DATE OF DISCHARGE:  06/18/2007                               DISCHARGE SUMMARY   DISCHARGE DIAGNOSES:  1. Vertigo with migraine component visual changes.  2. Episode of SVT of short duration asymptomatic times one during this      admission.  3. Mild hypokalemia.   HISTORY OF PRESENT ILLNESS:  [name] is a 50 year old African  American female admitted with intractable vertigo which was accompanied  by nausea and vomiting.  She has a history of migraines with the most  recent previous episode in 2006.  She was admitted for further  evaluation and treatment.   PAST MEDICAL HISTORY:  1. Hernia as a child.  2. History of two C sections  3. Total abdominal hysterectomy.  4. Cholecystectomy.  5. History of cervical surgery with fusion performed by Dr. Franky Macho.   COURSE OF HOSPITALIZATION:  VERTIGO WITH MIGRAINE COMPONENT.  The  patient was admitted.  She was started on meclizine as well as  antiemetics with little improvement.  She was seen in consultation by  Dr. Vickey Huger of Neurology.  She was started on Valium in addition to  meclizine and Zofran.  She also received a dose of IV Depakote during  this admission.  A trial of Imitrex was also attempted during this  admission.  The patient slowly improved.  She will be given a  prescription for p.r.n. meclizine, p.r.n. Ultram and p.r.n. Zofran.  Referral has been made for outpatient vestibular rehab.   DISCHARGE MEDICATIONS:  1. Zantac 3 mg p.o. daily.  2. Meclizine 12.5 mg p.o. q.6 h p.r.n. vertigo.  3. Zofran 4 mg p.o. q.8 h p.r.n. nausea.  4. Ultram 50 mg p.o. q.6 h p.r.n. headache.   PERTINENT LABORATORY DATA:  At time of discharge, BUN 8,  hemoglobin  12.6, hematocrit 32.   DISPOSITION:  The patient will be discharged to home.   FOLLOW UP:  The patient is instructed to follow up with Dr. Posey Rea in  1-2 weeks and contact the office for an appointment.      Sandford Craze, NP      Raenette Rover. Felicity Coyer, MD  Electronically Signed    MO/MEDQ  D:  06/18/2007  T:  06/19/2007  Job:  [id number]   cc:   Georgina Quint. Plotnikov, MD

## 2011-04-16 NOTE — Assessment & Plan Note (Signed)
[facility]                                 ON-CALL NOTE   SHAVONA, GUNDERMAN                      MRN:          [id number]  DATE:05/01/2008                            DOB:          02-25-1961    PRIMARY:  Georgina Quint. Plotnikov, MD.   SUBJECTIVE:  [name] is a patient with chronic vertigo for the past  year.  She states that over the past few days, her vertigo has worsened  and she has a new symptom of stabbing ear pains.  She denies fever,  chills, headache, visual change, or confusion.  She has no cold  symptoms.   ASSESSMENT AND PLAN:  Given new ear pains and severe vertigo, discussed  having her evaluated in an Urgent Care today.  The patient was not  agreeable.  She does have some meclizine at home, but stated that in the  past this did not help.  I encouraged her to try this medication again  and if it does not help, she can call her primary care doctor in the  morning.  If the ear pains continue, I did encourage her to be seen in  Urgent Care today for a possible ear infection or other issue.  She has  no neurologic symptoms and no hearing loss.     Kerby Nora, MD  Electronically Signed    AB/MedQ  DD: 05/01/2008  DT: 05/02/2008  Job #: [id number]

## 2011-04-19 NOTE — Op Note (Signed)
NAME:  Paula [name], Paula [name]                       ACCOUNT NO.:  [id number]   MEDICAL RECORD NO.:  [id number]                   PATIENT TYPE:  INP   LOCATION:  3106                                 FACILITY:  [facility]   PHYSICIAN:  Coletta Memos, M.D.                  DATE OF BIRTH:  [birth year]   DATE OF PROCEDURE:  02/10/2004  DATE OF DISCHARGE:                                 OPERATIVE REPORT   PREOPERATIVE DIAGNOSES:  1. Cervical spondylosis, C4-5, C5-6, C6-7 with myelopathy.  2. Cervical stenosis C4-C7.  3. Cervical displaced disk C4-5, C5-6, C6-7.  4. Cervical radiculopathy.   POSTOPERATIVE DIAGNOSES:  1. Cervical spondylosis, C4-5, C5-6, C6-7, with myelopathy.  2. Cervical stenosis C4-C7.  3. Cervical displaced disk C4-5, C5-6, C6-7.  4. Cervical radiculopathy.   PROCEDURE:  1. Corpectomy C5-6 with microdissection, interbody Zimmer tantalum graft, 35     mm, packed with local autograft.  2. Anterior plating, nontegmental, C3-C7 Premiere plate.   SURGEON:  Coletta Memos, M.D.   ASSISTANT:  Danae Orleans. Venetia Maxon, M.D.   COMPLICATIONS:  None.   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  I recommended, and she agreed to undergo operative  decompression of the corpectomy.   DESCRIPTION OF PROCEDURE:  [name] was brought to the operating room,  intubated and placed under general anesthetic without difficulty.  Her head  was placed in slight extension with 10 pounds of traction via the chin  strap.  I infiltrated 10 mL of 0.5% lidocaine, 1:200,000 strength  epinephrine into the cervical crease in a horizontal fashion, starting at  the midline and extending to the sternocleidomastoid on the left side.  The  patient was draped in a sterile fashion.  I opened the skin with the #10  blade.  I  dissected above the platysma and below the platysma after opening  that in the horizontal fashion in the direction of the wound.  I then  created an avascular plane to the anterior cervical spine.  I placed the  spinal needle and it showed that I was at C4-5. I then reflected the longus  coli muscles bilaterally C4-C7.  I then performed diskectomies at C4-5, C5-6  and C6-7.  Doing that, the self-retaining retractors were placed.  The  microscope was brought into position.  I then commenced with the corpectomy  of C5 and C6.  This was done with the use of the Leksell rongeur, Kerrison  punch and the high speed air drill.  With the microscopic dissection, I was  able to remove the C6 body initially.  I then got underneath and exposed the  dura there.  It was quite tight.  At C4-5, the disk was even more impressive  and a significant amount of  disk material was removed.  There was a small  dural opening made when removing some of the disk and ligament and that was  secondary to ossification.  However, there was full decompression with Dr.  Fredrich Birks assistance of the C4-5 and C5-6 levels.  After making sure the  corpectomy site was wide enough, I then placed a tantalum graft which was  packed with local autograft into the space.  The x-rays showed the graft to  be in good position and with good surface contact with the C4 and C7  vertebral bodies.  A Premiere plate was then placed from C4 to C7, two  screws in each bone.  This was first done by drilling and then placing self  tapping screws.  X-rays showed the plate and graft to be in good position.  I then irrigated the wound.  I then closed the wound in a layered fashion  using Vicryl sutures.  Dermabond was used for sterile dressing.  The patient  tolerated the procedure well.  He was extubated moving all extremities.                                               Coletta Memos, M.D.    KC/MEDQ  D:  02/10/2004  T:  02/12/2004  Job:   [id number]

## 2011-04-19 NOTE — Discharge Summary (Signed)
[facility]  Patient:    Paula [name], Paula [name]                    MRN: [id number] Adm. Date:  [id number] Disc. Date: [id number] Attending:  Henrene Dodge Dictator:   Joellyn Rued, P.A.C. CC:         Drs. Hochrein, Marina Goodell, and Plotnikov (1 copy addressed to all 3 doctors)                  Referring Physician Discharge [place]  DATE OF BIRTH:  1961-12-02  SUMMARY OF HISTORY:  [name] is a 50 year old black female who was evaluated by Dr. Antoine Poche in September for chest discomfort and stress Cardiolite revealed question breast attenuation versus ischemia, and a dobutamine echo was to be performed.  However, she developed substernal chest discomfort in her lower sternum at 4 a.m., which woke her from sleep.  She described it as a twisting, associated with shortness of breath.  She has chronic nausea and vomiting, and associated mild diaphoresis with the symptoms.  A GI cocktail briefly relieved the discomfort, minimal relief with Demerol.  LABORATORY DATA:  Echocardiogram revealed an EF of 55-65%, tricuspid regurgitation, peak gradient 240 cm/second, peak transtricuspid valve gradient 23.  EKG showed sinus bradycardia, borderline LVH, nonspecific ST-T wave changes.  H&H was 12.6 and 35.9, normal indices, platelets 344, WBC 8.3.  PT 13.0, PTT 28.  Sodium 137, potassium 3.6, BUN 12, creatinine 0.8.  CKs and troponins were negative for myocardial infarction.  TSH was 2.88.  HOSPITAL COURSE:  [name] was admitted to the hospital and seen by Dr. Antoine Poche.  Echocardiogram was performed and a dobutamine echo was performed. This was reviewed by the physicians and felt to be within normal limits, thus she was discharged home.  DISCHARGE DIAGNOSIS:  Noncardiac chest discomfort.  DISCHARGE INSTRUCTIONS:  MEDICATIONS:  Continue her current medications, as well as Protonix 40 mg b.i.d.  DIET:  Maintain low sodium/low fat  diet.  FOLLOW-UP:  Call her primary care physician for follow-up appointment. DD:  09/29/00 TD:  09/29/00 Job: [id number]

## 2011-04-19 NOTE — Discharge Summary (Signed)
NAME:  Paula [name], Paula [name]                       ACCOUNT NO.:  [id number]   MEDICAL RECORD NO.:  [id number]                   PATIENT TYPE:  INP   LOCATION:  3011                                 FACILITY:  [facility]   PHYSICIAN:  Coletta Memos, M.D.                  DATE OF BIRTH:  September 28, 1961   DATE OF ADMISSION:  02/10/2004  DATE OF DISCHARGE:                                 DISCHARGE SUMMARY   ADMISSION DIAGNOSES:  1. Cervical stenosis.  2. Cervical spondylosis with myelopathy.  3. Cervical displaced disk.  4. Cervical radiculopathy.   DISCHARGE DIAGNOSES:  1. Cervical stenosis.  2. Cervical spondylosis with myelopathy.  3. Cervical displaced disk.  4. Cervical radiculopathy.   PROCEDURE:  Cervical corpectomy C5-C6, arthrodesis C4 to C6, anterior  plating C4 to C7 with tantalum ___________and morselized autograft.   SURGEON:  Coletta Memos, M.D.   COMPLICATIONS:  None.   DISCHARGE STATUS:  Alive and well.   MEDICATIONS:  Percocet elixir.   Wound clean and dry without signs of infection.   PHYSICAL EXAMINATION:  Excellent strength in the upper extremities. Voice  strong.   HOSPITAL COURSE:  Paula [name] was admitted and underwent a corpectomy at C5  and C6. Postoperatively, she has done quite well. She has felt she has a  frog in her throat which is not usual. She has had some minor difficulties  swallowing but absolutely no problems breathing. Wound is clean, flat, dry,  and without signs of infection at discharge. She was given instructions as  to no driving. She was told to call the office for a routine appointment in  approximately 10 days.                                                Coletta Memos, M.D.    KC/MEDQ  D:  02/14/2004  T:  02/16/2004  Job:  [id number]

## 2011-04-19 NOTE — Op Note (Signed)
[facility]  Patient:    AVIELLA, DISBROW                    MRN: [id number] Proc. Date: 10/07/00 Adm. Date:  [id number] Attending:  Henrene Dodge CC:         Sonda Primes, M.D. [facility]   Operative Report  PREOPERATIVE DIAGNOSIS:   Chronic cholecystitis.  POSTOPERATIVE DIAGNOSIS:  Chronic cholecystitis.  OPERATION:  Laparoscopic cholecystectomy with cholangiogram.  SURGEON:  Anselm Pancoast. Zachery Dakins, M.D.  ASSISTANT:  Milus Mallick, M.D.  ANESTHESIA:  General.  INDICATIONS:  The patient is a 50 year old female who was referred to me by Dr. Sonda Primes for symptomatic gallbladder problems.  The patient had extensive work-up with upper endoscopy and an ultrasound of the gallbladder. She has had previous cervical surgery for chronic neck problems and the ultrasound did not show definite signs but shows some sludge.  She did have a cardiac evaluation to make sure that this was not cardiac in origin and that it is thought not to be the etiology of this epigastric pain.  Therefore, she was cleared to proceed and undergo a laparoscopic cholecystectomy.  The patient is not on narcotics for her neck and back problem at this time.  DESCRIPTION OF PROCEDURE:  The patient was taken to the operative suite.  She was given 3 g of Unasyn.  She has PAS stockings, then induction of general anesthesia then the abdomen was prepped with Betadine surgical scrub and solution and draped in a sterile manner.  She has had a previous hysterectomy and a small incision was made just below the umbilicus and sharp dissection down through both lower layers of the fascia.  The peritoneum was divided and carefully picked up with hemostats and a small opening made.  A traction suture was placed both superior and inferiorly and then the Hasson cannula was introduced.  The gallbladder was swollen but not acutely inflamed and I did not see any significant adhesions around  it.  The upper 10 mm trocar was placed in the subxiphoid area after anesthetizing the fascia and the two lateral 5 mm trocars had been placed in the _______  position ______________.  With sharp dissection elevating the gallbladder, the peritoneum was opened and the cystic duct was clipped at the junction of the gallbladder.  The cystic duct being intact, the catheter was introduced . There was a lot of back pressure along the cystic duct and bile was flowing vigorously out.  The Taut catheter was introduced and then x-rayed.  There was nice taper to the distal common bile duct with the intrahepatic and the extrahepatic portion most definitely prominent, and I wonder if she is one of these whose sphincter _____ spasm _____ ampullary stenosis and is possibly the etiology of her pain.  The Taut catheter was removed.  The cystic duct was triply clipped and divided.  The cystic artery was then identified and doubly clipped proximally, singly, and distally divided, and then the gallbladder freed from its bed with the hook electrocautery.  Good hemostasis obtained and then the gallbladder, after it was freed, was _________ and taken down to the umbilicus.  I could not feel any stones in the gallbladder but I did not actually open it.  The fascia of the umbilicus was then closed with a figure-of-eight of 0 Vicryl in addition to the two sutures previously placed which were tied.  The fascia and umbilicus were anesthetized with Marcaine. The two lateral  5 mm trocars were withdrawn after inspection of the bed.  At the sites of bleeding, a little bit of irrigated fluid had been used to then aspirate it.  The carbon dioxide was released from the peritoneal cavity and then the upper 10 mm trocar was withdrawn. DD:  10/07/00 TD:  10/07/00 Job: [id number]
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
## 2011-06-12 ENCOUNTER — Ambulatory Visit (INDEPENDENT_AMBULATORY_CARE_PROVIDER_SITE_OTHER): Payer: Self-pay

## 2011-06-12 DIAGNOSIS — R197 Diarrhea, unspecified: Secondary | ICD-10-CM

## 2011-06-26 ENCOUNTER — Ambulatory Visit (AMBULATORY_SURGERY_CENTER): Payer: Self-pay | Admitting: Internal Medicine

## 2011-06-26 ENCOUNTER — Encounter: Payer: Self-pay | Admitting: Internal Medicine

## 2011-06-26 VITALS — BP 169/102 | HR 68 | Temp 99.3°F | Resp 15 | Ht 65.0 in | Wt 217.0 lb

## 2011-06-26 DIAGNOSIS — K5730 Diverticulosis of large intestine without perforation or abscess without bleeding: Secondary | ICD-10-CM

## 2011-06-26 DIAGNOSIS — Z1211 Encounter for screening for malignant neoplasm of colon: Secondary | ICD-10-CM

## 2011-06-26 MED ORDER — SODIUM CHLORIDE 0.9 % IV SOLN: 500.0000 mL | INTRAVENOUS | Status: DC

## 2011-06-26 NOTE — Patient Instructions (Signed)
Follow discharge instructions.  Continue your medications.   Next Colonoscopy in 10 years. 

## 2011-06-27 ENCOUNTER — Telehealth: Payer: Self-pay | Admitting: *Deleted

## 2011-06-27 NOTE — Telephone Encounter (Signed)

## 2011-07-04 ENCOUNTER — Ambulatory Visit (INDEPENDENT_AMBULATORY_CARE_PROVIDER_SITE_OTHER): Payer: Self-pay

## 2011-07-04 DIAGNOSIS — R197 Diarrhea, unspecified: Secondary | ICD-10-CM

## 2011-07-22 ENCOUNTER — Ambulatory Visit: Payer: Self-pay

## 2011-09-16 LAB — BASIC METABOLIC PANEL
GFR calc Af Amer: >60
GFR calc non Af Amer: >60
Potassium: 3.3 — ABNORMAL LOW
Sodium: 137

## 2011-09-17 LAB — I-STAT 8, (EC8 V) (CONVERTED LAB)
Acid-base deficit: 2
Chloride: 108
HCT: 37
Operator id: 192351
Potassium: 3.4 — ABNORMAL LOW
Sodium: 140
TCO2: 21

## 2011-10-25 ENCOUNTER — Ambulatory Visit (INDEPENDENT_AMBULATORY_CARE_PROVIDER_SITE_OTHER)
Admission: RE | Admit: 2011-10-25 | Discharge: 2011-10-25 | Disposition: A | Discharge status: Home / Self Care | Payer: Self-pay | Source: Ambulatory Visit | Attending: Internal Medicine | Admitting: Internal Medicine

## 2011-10-25 ENCOUNTER — Ambulatory Visit (INDEPENDENT_AMBULATORY_CARE_PROVIDER_SITE_OTHER): Payer: Self-pay | Admitting: Internal Medicine

## 2011-10-25 VITALS — BP 132/84 | HR 80 | Temp 98.8°F | Wt 229.0 lb

## 2011-10-25 DIAGNOSIS — G8929 Other chronic pain: Secondary | ICD-10-CM

## 2011-10-25 DIAGNOSIS — M542 Cervicalgia: Secondary | ICD-10-CM

## 2011-10-25 DIAGNOSIS — I10 Essential (primary) hypertension: Secondary | ICD-10-CM

## 2011-10-25 DIAGNOSIS — J040 Acute laryngitis: Secondary | ICD-10-CM

## 2011-10-25 MED ORDER — PROMETHAZINE-CODEINE 6.25-10 MG/5ML PO SYRP: 5.0000 mL | ORAL_SOLUTION | ORAL | Status: DC | PRN

## 2011-10-25 MED ORDER — PROMETHAZINE-CODEINE 6.25-10 MG/5ML PO SYRP: 5.0000 mL | ORAL_SOLUTION | ORAL | Status: AC | PRN

## 2011-10-25 MED ORDER — AMOXICILLIN 500 MG PO CAPS: 1000.0000 mg | ORAL_CAPSULE | Freq: Two times a day (BID) | ORAL | Status: AC

## 2011-10-25 MED ORDER — METHYLPREDNISOLONE ACETATE 80 MG/ML IJ SUSP
120.0000 mg | Freq: Once | INTRAMUSCULAR | Status: AC
  Administered 2011-10-25: 120 mg via INTRAMUSCULAR

## 2011-10-25 NOTE — Progress Notes (Signed)
  Subjective:    Patient ID: Paula [name], female    DOB: 05-Jul-1961, 49 y.o.   MRN: [id number]  HPI   HPI  C/o URI sx's x  14 days. C/o ST, cough, weakness. Not better with OTC medicines. Actually, the patient is getting worse. The patient did not sleep last night due to cough.  Review of Systems  Constitutional: Positive for fever, chills and fatigue.  HENT: Positive for congestion, rhinorrhea, sneezing and postnasal drip.   Eyes: Positive for photophobia and pain. Negative for discharge and visual disturbance.  Respiratory: Positive for cough and wheezing.   Positive for chest pain.  Gastrointestinal: Negative for vomiting, abdominal pain, diarrhea and abdominal distention.  Genitourinary: Negative for dysuria and difficulty urinating.  Skin: Negative for rash.  Neurological: Positive for dizziness, weakness and light-headedness.   C/o severe neck pain and LR arm pain, weakness x 2 mo   Review of Systems     Objective:   Physical Exam  Constitutional: She appears well-developed. No distress.       obese  HENT:  Head: Normocephalic.  Right Ear: External ear normal.  Left Ear: External ear normal.  Nose: Nose normal.       eryth throat, hoarse  Eyes: Conjunctivae are normal. Pupils are equal, round, and reactive to light. Right eye exhibits no discharge. Left eye exhibits no discharge.  Neck: Normal range of motion. Neck supple. No JVD present. No tracheal deviation present. No thyromegaly present.  Cardiovascular: Normal rate, regular rhythm and normal heart sounds.   Pulmonary/Chest: No stridor. No respiratory distress. She has no wheezes.  Abdominal: Soft. Bowel sounds are normal. She exhibits no distension and no mass. There is no tenderness. There is no rebound and no guarding.  Musculoskeletal: She exhibits tenderness. She exhibits no edema.       Neck is tender on R w.decr ROM due to pain. R shoulder is tender w/ROM MS is hard to assess due to pain    Lymphadenopathy:    She has no cervical adenopathy.  Neurological: She displays normal reflexes. No cranial nerve deficit. She exhibits normal muscle tone. Coordination normal.  Skin: No rash noted. No erythema.  Psychiatric: Her behavior is normal. Judgment and thought content normal.       sad          Assessment & Plan:

## 2011-10-27 ENCOUNTER — Telehealth: Payer: Self-pay | Admitting: Internal Medicine

## 2011-10-27 ENCOUNTER — Encounter: Payer: Self-pay | Admitting: Internal Medicine

## 2011-10-27 NOTE — Assessment & Plan Note (Signed)
Continue with current prescription therapy as reflected on the Med list.  

## 2011-10-27 NOTE — Assessment & Plan Note (Signed)
See meds 

## 2011-10-27 NOTE — Telephone Encounter (Signed)
Paula [name], please, inform patient that her c spine xray revealed hardware to be intact Thx

## 2011-10-27 NOTE — Assessment & Plan Note (Signed)
Worse in 11/12 Possible R radiculopathy See meds Consults offered

## 2011-10-28 NOTE — Telephone Encounter (Signed)
Noted. Thx.

## 2011-10-28 NOTE — Telephone Encounter (Signed)
Pt informed. She states she is still taking muscle relaxer and is waiting to feel better.

## 2011-12-10 ENCOUNTER — Telehealth: Payer: Self-pay | Admitting: *Deleted

## 2011-12-10 NOTE — Telephone Encounter (Signed)
Pt c/o still having Right shoulder & arm pain and cramping in neck & back of head. Would like to get your recommendation as to what she can do for comfort.?

## 2011-12-10 NOTE — Telephone Encounter (Signed)
Sports cream Heat Look up exercises for shoulder ROM on youtube.com OV if issues

## 2011-12-11 NOTE — Telephone Encounter (Signed)
LMOM to inform patient: Sports creams can be safe and effective when used in moderation. Apply heat no longer on 15 minutes at a time to affected area. Where to find exercises ROM. OV if needed for continuing issues.

## 2011-12-30 ENCOUNTER — Encounter: Payer: Self-pay | Admitting: Internal Medicine

## 2011-12-30 ENCOUNTER — Ambulatory Visit (INDEPENDENT_AMBULATORY_CARE_PROVIDER_SITE_OTHER): Payer: Self-pay | Admitting: Internal Medicine

## 2011-12-30 VITALS — BP 148/100 | HR 72 | Temp 98.1°F | Resp 16 | Wt 222.0 lb

## 2011-12-30 DIAGNOSIS — M79609 Pain in unspecified limb: Secondary | ICD-10-CM

## 2011-12-30 DIAGNOSIS — I10 Essential (primary) hypertension: Secondary | ICD-10-CM

## 2011-12-30 DIAGNOSIS — M79601 Pain in right arm: Secondary | ICD-10-CM

## 2011-12-30 DIAGNOSIS — M542 Cervicalgia: Secondary | ICD-10-CM

## 2011-12-30 DIAGNOSIS — Z23 Encounter for immunization: Secondary | ICD-10-CM

## 2011-12-30 MED ORDER — HYDROCHLOROTHIAZIDE 12.5 MG PO CAPS: 12.5000 mg | ORAL_CAPSULE | Freq: Every day | ORAL | Status: DC

## 2011-12-30 MED ORDER — NAPROXEN 500 MG PO TABS: 500.0000 mg | ORAL_TABLET | Freq: Two times a day (BID) | ORAL | Status: DC

## 2011-12-30 MED ORDER — VITAMIN D 1000 UNITS PO TABS: 1000.0000 [IU] | ORAL_TABLET | Freq: Every day | ORAL | Status: DC

## 2011-12-30 MED ORDER — TRAMADOL HCL 50 MG PO TABS: 50.0000 mg | ORAL_TABLET | Freq: Two times a day (BID) | ORAL | Status: AC | PRN

## 2011-12-30 NOTE — Assessment & Plan Note (Signed)
1/13 restart HCTZ

## 2011-12-30 NOTE — Progress Notes (Signed)
  Subjective:    Patient ID: Paula [name], female    DOB: [birth year], 51 y.o.   MRN: [id number]  HPI  C/o R trap and shoulder pain going down the arm into the hand x 2-3 wks, severe at times  Review of Systems  Constitutional: Positive for diaphoresis and fatigue. Negative for fever and unexpected weight change.  HENT: Positive for neck pain and neck stiffness. Negative for ear discharge.   Eyes: Negative for redness.  Respiratory: Negative for chest tightness and shortness of breath.   Cardiovascular: Negative for leg swelling.  Gastrointestinal: Negative for nausea.  Genitourinary: Negative for dysuria, urgency, hematuria, flank pain, difficulty urinating and genital sores.  Musculoskeletal: Positive for back pain. Negative for joint swelling, arthralgias and gait problem.  Skin: Negative.  Negative for rash.  Neurological: Positive for numbness. Negative for dizziness, tremors, speech difficulty and headaches.  Hematological: Negative for adenopathy. Does not bruise/bleed easily.  Psychiatric/Behavioral: Negative for suicidal ideas, behavioral problems, sleep disturbance and decreased concentration. The patient is not nervous/anxious.        Objective:   Physical Exam  Constitutional: She appears well-developed.       Obese   Neck: No JVD present. No thyromegaly present.  Pulmonary/Chest: She exhibits no tenderness.  Abdominal: There is no tenderness.  Musculoskeletal: She exhibits tenderness. She exhibits no edema.       R trap and R shoulder, arm is tender; hand is tender DTRs and MS OK Neck is tender with ROM  Lymphadenopathy:    She has no cervical adenopathy.  Neurological: Coordination normal.          Assessment & Plan:

## 2011-12-30 NOTE — Assessment & Plan Note (Signed)
1/13 chronic - sx's sound like cervical radiculopathy sx's S/p fusion 5 y ago Dr Mikal Plane NS consult Will try Naproxen, Tramadol prn

## 2011-12-30 NOTE — Assessment & Plan Note (Signed)
See meds: start HCTZ

## 2012-01-24 ENCOUNTER — Telehealth: Payer: Self-pay

## 2012-01-24 MED ORDER — OXYCODONE-ACETAMINOPHEN 10-325 MG PO TABS: 1.0000 | ORAL_TABLET | Freq: Three times a day (TID) | ORAL | Status: AC | PRN

## 2012-01-24 NOTE — Telephone Encounter (Signed)
Pt called stating that Tramadol is not helping with RT arm pain. Pt says she has tried heat, tylenol and rest and is has not improved. Pt says that pt is excruciating but she does not want to go to the ER, pt is requesting advisement from MD, possibly stringer pain medication,

## 2012-01-24 NOTE — Telephone Encounter (Signed)
Pt informed

## 2012-01-24 NOTE — Telephone Encounter (Signed)
Ok Oxycod prn OV if bad Thx

## 2012-01-29 ENCOUNTER — Telehealth: Payer: Self-pay | Admitting: *Deleted

## 2012-01-29 NOTE — Telephone Encounter (Signed)
Bring oxycod to office to discard We can start Tramadol Thx

## 2012-01-29 NOTE — Telephone Encounter (Signed)
Pt states that her arm pain is still very severe but that she cannot take the Oxycodone is too strong for her-she is asking for MD's advisement or something else regarding arm pain

## 2012-01-30 NOTE — Telephone Encounter (Signed)
Left message for pt to callback office.  

## 2012-01-30 NOTE — Telephone Encounter (Signed)
Pt informed of MD's advisement. 

## 2012-02-11 ENCOUNTER — Encounter (HOSPITAL_COMMUNITY): Payer: Self-pay | Admitting: *Deleted

## 2012-02-11 ENCOUNTER — Emergency Department (HOSPITAL_COMMUNITY)
Admission: EM | Admit: 2012-02-11 | Discharge: 2012-02-12 | Disposition: A | Discharge status: Home / Self Care | Payer: Self-pay | Attending: Emergency Medicine | Admitting: Emergency Medicine

## 2012-02-11 DIAGNOSIS — Z0389 Encounter for observation for other suspected diseases and conditions ruled out: Secondary | ICD-10-CM | POA: Insufficient documentation

## 2012-02-11 DIAGNOSIS — M542 Cervicalgia: Secondary | ICD-10-CM

## 2012-02-11 NOTE — ED Notes (Addendum)
Pt in c/o neck pain since last year, increased pain recently, seen PMD for same and was given oxycodone but that made her vomit, left earache over last few days and sore throat

## 2012-02-13 ENCOUNTER — Ambulatory Visit (INDEPENDENT_AMBULATORY_CARE_PROVIDER_SITE_OTHER): Payer: Self-pay | Admitting: Endocrinology

## 2012-02-13 VITALS — BP 124/74 | HR 70 | Temp 99.4°F | Wt 222.8 lb

## 2012-02-13 DIAGNOSIS — M542 Cervicalgia: Secondary | ICD-10-CM

## 2012-02-13 MED ORDER — DOXYCYCLINE HYCLATE 100 MG PO TABS: 100.0000 mg | ORAL_TABLET | Freq: Two times a day (BID) | ORAL | Status: AC

## 2012-02-13 MED ORDER — MEPERIDINE HCL 50 MG PO TABS: 50.0000 mg | ORAL_TABLET | ORAL | Status: AC | PRN

## 2012-02-13 NOTE — Progress Notes (Signed)
  Subjective:    Patient ID: Paula [name], female    DOB: 01/26/1961, 51 y.o.   MRN: [id number]  HPI Pt states 6 mos of moderate pain rad from the right posterior neck, to the right thumb.  There is assoc numbness.  No help with steroid injection. She had c-spine surgery in the past, by dr cabell.   Past Medical History  Diagnosis Date  . Allergy   . Anemia   . Anxiety   . Depression   . GERD (gastroesophageal reflux disease)   . Heart murmur   . Hyperlipidemia   . Hypertension   . Thyroid disease     nodule  . Ulcer     Past Surgical History  Procedure Date  . Cholecystectomy   . Cesarean section   . Cervical fusion     History   Social History  . Marital Status: Single    Spouse Name: N/A    Number of Children: N/A  . Years of Education: N/A   Occupational History  . Not on file.   Social History Main Topics  . Smoking status: Never Smoker   . Smokeless tobacco: Never Used  . Alcohol Use: No  . Drug Use: No  . Sexually Active: Yes   Other Topics Concern  . Not on file   Social History Narrative  . No narrative on file    Current Outpatient Prescriptions on File Prior to Visit  Medication Sig Dispense Refill  . omeprazole (PRILOSEC) 20 MG capsule Take 20 mg by mouth daily.        . traMADol (ULTRAM) 50 MG tablet Take 50 mg by mouth every 6 (six) hours as needed. Pain       Current Facility-Administered Medications on File Prior to Visit  Medication Dose Route Frequency Provider Last Rate Last Dose  . 0.9 %  sodium chloride infusion  500 mL Intravenous Continuous Hilarie Fredrickson, MD        Allergies  Allergen Reactions  . Aspirin   . Hydrocodone-Acetaminophen     vomiting  . Moxifloxacin     REACTION: pruritis  . Oxycodone Nausea And Vomiting    vomiting    Family History  Problem Relation Age of Onset  . Colon cancer Maternal Grandfather   . Hyperlipidemia Mother     BP 124/74  Pulse 70  Temp(Src) 99.4 F (37.4 C) (Oral)  Wt 222 lb  12.8 oz (101.061 kg)  SpO2 97%    Review of Systems Denies rash.  She has few days of left ear pain    Objective:   Physical Exam VITAL SIGNS:  See vs page GENERAL: no distress Right shoulder:  rom severely limited by pain Neuro: sensation is intact to touch on the radial aspect of the right hand, but decreased from normal.  Motor function is limited by pain. Left tm is slightly red       Assessment & Plan:  Neck pain, persistent Left aom, new

## 2012-02-13 NOTE — Patient Instructions (Addendum)
Here is a pain prescription. Please make a follow-up appointment with dr Mikal Plane. i have sent a prescription to your pharmacy, for an antibiotic. I hope you feel better soon.  If you don't feel better by next week, please call dr Posey Rea

## 2012-02-15 NOTE — ED Provider Notes (Signed)
Pt left AMA prior to my evaluation.  NO actual face to face encounter with this patient.    Fayrene Helper, PA-C 02/15/12 [phone]

## 2012-02-15 NOTE — ED Provider Notes (Signed)
Medical screening examination/treatment/procedure(s) were performed by non-physician practitioner and as supervising physician I was immediately available for consultation/collaboration.  Cyndra Numbers, MD 02/15/12 1743

## 2012-02-17 ENCOUNTER — Telehealth: Payer: Self-pay

## 2012-02-17 DIAGNOSIS — M542 Cervicalgia: Secondary | ICD-10-CM

## 2012-02-17 NOTE — Telephone Encounter (Signed)
Pt called to inform MD that she now has Insurance that will cover an MRI. She is still experiencing pain and discomfort and requests that imaging be scheduled ASAP.

## 2012-02-18 ENCOUNTER — Ambulatory Visit (INDEPENDENT_AMBULATORY_CARE_PROVIDER_SITE_OTHER): Payer: Self-pay | Admitting: *Deleted

## 2012-02-18 ENCOUNTER — Other Ambulatory Visit: Payer: Self-pay | Admitting: Obstetrics and Gynecology

## 2012-02-18 VITALS — BP 136/84 | HR 82 | Temp 100.3°F | Ht 64.0 in | Wt 220.1 lb

## 2012-02-18 DIAGNOSIS — N644 Mastodynia: Secondary | ICD-10-CM

## 2012-02-18 DIAGNOSIS — R921 Mammographic calcification found on diagnostic imaging of breast: Secondary | ICD-10-CM

## 2012-02-18 DIAGNOSIS — N63 Unspecified lump in unspecified breast: Secondary | ICD-10-CM

## 2012-02-18 DIAGNOSIS — Z01419 Encounter for gynecological examination (general) (routine) without abnormal findings: Secondary | ICD-10-CM

## 2012-02-18 DIAGNOSIS — N6310 Unspecified lump in the right breast, unspecified quadrant: Secondary | ICD-10-CM

## 2012-02-18 NOTE — Patient Instructions (Signed)
Taught patient how to perform BSE. Told patient if today's Pap smear is normal she will not need any further Pap smears due to history of hysterectomy for benign reasons. Patient is scheduled for a diagnostic mammogram at the [facility] Tuesday, February 25, 2012 at 0830. Patient aware of appointment and will be there. Let patient know will follow up with her within the next couple weeks with results. Patient verbalized understanding.

## 2012-02-18 NOTE — Telephone Encounter (Signed)
IS it still her neck? Thx

## 2012-02-18 NOTE — Progress Notes (Signed)
Complaints of tenderness in left breast.  Pap Smear:    Pap smear completed today. Per patient last Pap smear was in 2008 or 2009 and was normal. Per patient has a history of an abnormal Pap smear that required a repeat Pap smear. Patient has a history of a partial hysterectomy in 1990 for fibroids and heavy bleeding. Patient did state that had some precancerous cells that is partially why had hysterectomy.  If this Pap smear comes back normal patient will not need any further Pap smears per BCCCP and ACOG guidelines. No Pap smear results in EPIC.  Physical exam: Breasts Breasts symmetrical. No skin abnormalities bilateral breasts. No nipple retraction bilateral breasts. No nipple discharge bilateral breasts. No lymphadenopathy. No lumps palpated left breast. Patient complained of tenderness when palpated left breast. Palpated lump in right breast at 9 o'clock. Patient complained of tenderness on palpation of lump. Patient was recommended to have a follow up diagnostic mammogram around October 2011. Patient did not follow up until now. Patient referred to [facility] for bilateral diagnostic mammogram. Appointment scheduled Tuesday, February 25, 2012 at 0830.         Pelvic/Bimanual   Ext Genitalia No lesions, no swelling and no discharge observed on external genitalia.         Vagina Vagina pink and normal texture. No lesions or discharge observed in vagina. Vaginal Pap smear completed.          Cervix Cervix is absent due to history of partial hysterectomy.    Uterus Uterus is absent due to history of partial hysterectomy.   Adnexae Bilateral ovaries present and palpable. No tenderness on palpation.          Rectovaginal No rectal exam completed today since patient had no rectal complaints. No skin abnormalities observed on exam.

## 2012-02-19 NOTE — Telephone Encounter (Signed)
I will order an MRI OV in 1 wk Thx

## 2012-02-19 NOTE — Telephone Encounter (Signed)
Spoke to pt- she states she is having numbness and pain in Right arm, shoulder neck and upper back. She states Tramadol is making her feel sick. Please advise.

## 2012-02-19 NOTE — Telephone Encounter (Signed)
Pt informed/transferred to scheduler.  

## 2012-02-19 NOTE — Telephone Encounter (Signed)
Left mess for patient to call back.  

## 2012-02-21 MED ORDER — METRONIDAZOLE 500 MG PO TABS: 500.0000 mg | ORAL_TABLET | Freq: Two times a day (BID) | ORAL | Status: DC

## 2012-02-21 NOTE — Progress Notes (Signed)
Addended by: Adem Costlow on: 02/21/2012 03:30 PM   Modules accepted: Orders  

## 2012-02-25 ENCOUNTER — Telehealth: Payer: Self-pay | Admitting: *Deleted

## 2012-02-25 ENCOUNTER — Ambulatory Visit
Admission: RE | Admit: 2012-02-25 | Discharge: 2012-02-25 | Disposition: A | Discharge status: Home / Self Care | Payer: No Typology Code available for payment source | Source: Ambulatory Visit | Attending: Obstetrics and Gynecology | Admitting: Obstetrics and Gynecology

## 2012-02-25 ENCOUNTER — Other Ambulatory Visit: Payer: Self-pay | Admitting: Obstetrics and Gynecology

## 2012-02-25 ENCOUNTER — Encounter: Payer: Self-pay | Admitting: Internal Medicine

## 2012-02-25 ENCOUNTER — Ambulatory Visit (INDEPENDENT_AMBULATORY_CARE_PROVIDER_SITE_OTHER): Payer: No Typology Code available for payment source | Admitting: Internal Medicine

## 2012-02-25 VITALS — BP 120/70 | HR 72 | Temp 98.6°F | Resp 16 | Wt 224.0 lb

## 2012-02-25 DIAGNOSIS — N644 Mastodynia: Secondary | ICD-10-CM

## 2012-02-25 DIAGNOSIS — R921 Mammographic calcification found on diagnostic imaging of breast: Secondary | ICD-10-CM

## 2012-02-25 DIAGNOSIS — M542 Cervicalgia: Secondary | ICD-10-CM

## 2012-02-25 DIAGNOSIS — M79601 Pain in right arm: Secondary | ICD-10-CM

## 2012-02-25 DIAGNOSIS — E559 Vitamin D deficiency, unspecified: Secondary | ICD-10-CM

## 2012-02-25 DIAGNOSIS — N63 Unspecified lump in unspecified breast: Secondary | ICD-10-CM

## 2012-02-25 DIAGNOSIS — K219 Gastro-esophageal reflux disease without esophagitis: Secondary | ICD-10-CM

## 2012-02-25 DIAGNOSIS — R209 Unspecified disturbances of skin sensation: Secondary | ICD-10-CM

## 2012-02-25 DIAGNOSIS — M79609 Pain in unspecified limb: Secondary | ICD-10-CM

## 2012-02-25 DIAGNOSIS — I10 Essential (primary) hypertension: Secondary | ICD-10-CM

## 2012-02-25 DIAGNOSIS — R202 Paresthesia of skin: Secondary | ICD-10-CM

## 2012-02-25 MED ORDER — GABAPENTIN 100 MG PO CAPS: 100.0000 mg | ORAL_CAPSULE | Freq: Three times a day (TID) | ORAL | Status: DC | PRN

## 2012-02-25 NOTE — Assessment & Plan Note (Signed)
Continue with current prescription therapy as reflected on the Med list. Try Gabapentin

## 2012-02-25 NOTE — Assessment & Plan Note (Signed)
MRI is scheduled.

## 2012-02-25 NOTE — Assessment & Plan Note (Signed)
Continue with current prescription therapy as reflected on the Med list.  

## 2012-02-25 NOTE — Progress Notes (Signed)
Patient ID: Paula [name], female   DOB: May 03, 1961, 51 y.o.   MRN: [id number]  Subjective:    Patient ID: Paula [name], female    DOB: 07-08-1961, 51 y.o.   MRN: [id number]  HPI  C/o ongoing R trap, neck and shoulder pain going down the arm into the hand x 2-3 wks, severe at times at the level of 8/10  Wt Readings from Last 3 Encounters:  02/25/12 224 lb (101.606 kg)  02/18/12 220 lb 1.6 oz (99.837 kg)  02/13/12 222 lb 12.8 oz (101.061 kg)   BP Readings from Last 3 Encounters:  02/25/12 120/70  02/18/12 136/84  02/13/12 124/74     Review of Systems  Constitutional: Positive for diaphoresis and fatigue. Negative for fever and unexpected weight change.  HENT: Positive for neck pain and neck stiffness. Negative for ear discharge.   Eyes: Negative for redness.  Respiratory: Negative for chest tightness and shortness of breath.   Cardiovascular: Negative for leg swelling.  Gastrointestinal: Negative for nausea.  Genitourinary: Negative for dysuria, urgency, hematuria, flank pain, difficulty urinating and genital sores.  Musculoskeletal: Positive for back pain. Negative for joint swelling, arthralgias and gait problem.  Skin: Negative.  Negative for rash.  Neurological: Positive for numbness. Negative for dizziness, tremors, speech difficulty and headaches.  Hematological: Negative for adenopathy. Does not bruise/bleed easily.  Psychiatric/Behavioral: Negative for suicidal ideas, behavioral problems, sleep disturbance and decreased concentration. The patient is not nervous/anxious.        Objective:   Physical Exam  Constitutional: She appears well-developed.       Obese   Neck: No JVD present. No thyromegaly present.  Pulmonary/Chest: She exhibits no tenderness.  Abdominal: There is no tenderness.  Musculoskeletal: She exhibits tenderness. She exhibits no edema.       R trap and R shoulder, arm is tender; hand is tender DTRs and MS OK Neck is tender with ROM    Lymphadenopathy:    She has no cervical adenopathy.  Neurological: Coordination normal.    Lab Results  Component Value Date   WBC 9.1 05/10/2010   HGB 12.4 05/10/2010   HCT 36.0 05/10/2010   PLT 310.0 05/10/2010   GLUCOSE 94 03/30/2011   CHOL 261* 01/08/2010   TRIG 210.0* 01/08/2010   HDL 51.40 01/08/2010   LDLDIRECT 170.1 01/08/2010   ALT 26 03/30/2011   AST 26 03/30/2011   NA 139 03/30/2011   K 3.9 03/30/2011   CL 107 03/30/2011   CREATININE 0.68 03/30/2011   BUN 6 03/30/2011   CO2 25 03/30/2011   TSH 1.24 05/10/2010   HGBA1C 5.2 01/08/2010         Assessment & Plan:

## 2012-02-25 NOTE — Telephone Encounter (Signed)
Telephoned pt at home # and advised pt of negative pap smear and that pap smear also showed trichmomonas. Advised pt that med had been called in to pharmacy and that partner also needed to be treated. Pt voiced understanding. Pt had diagnostic mammogram this morning along with ultrasound. Pt states that lump was just benign.

## 2012-02-26 ENCOUNTER — Other Ambulatory Visit (INDEPENDENT_AMBULATORY_CARE_PROVIDER_SITE_OTHER): Payer: No Typology Code available for payment source

## 2012-02-26 DIAGNOSIS — M79601 Pain in right arm: Secondary | ICD-10-CM

## 2012-02-26 DIAGNOSIS — E559 Vitamin D deficiency, unspecified: Secondary | ICD-10-CM

## 2012-02-26 DIAGNOSIS — R202 Paresthesia of skin: Secondary | ICD-10-CM

## 2012-02-26 DIAGNOSIS — I10 Essential (primary) hypertension: Secondary | ICD-10-CM

## 2012-02-26 DIAGNOSIS — M542 Cervicalgia: Secondary | ICD-10-CM

## 2012-02-26 DIAGNOSIS — M79609 Pain in unspecified limb: Secondary | ICD-10-CM

## 2012-02-26 DIAGNOSIS — R209 Unspecified disturbances of skin sensation: Secondary | ICD-10-CM

## 2012-02-26 LAB — BASIC METABOLIC PANEL
Chloride: 102 mEq/L (ref 96–112)
GFR: 125.45 mL/min (ref >60.00)
Glucose, Bld: 94 mg/dL (ref 70–99)
Potassium: 4 mEq/L (ref 3.5–5.1)
Sodium: 137 mEq/L (ref 135–145)

## 2012-02-26 LAB — CBC WITH DIFFERENTIAL/PLATELET
Basophils Relative: 1.1 % (ref 0.0–3.0)
Eosinophils Relative: 3.8 % (ref 0.0–5.0)
HCT: 39.3 % (ref 36.0–46.0)
Hemoglobin: 13.1 g/dL (ref 12.0–15.0)
Lymphs Abs: 4.1 10*3/uL — ABNORMAL HIGH (ref 0.7–4.0)
MCV: 89.3 fl (ref 78.0–100.0)
Monocytes Absolute: 0.4 10*3/uL (ref 0.1–1.0)
Monocytes Relative: 4.9 % (ref 3.0–12.0)
Neutro Abs: 3.7 10*3/uL (ref 1.4–7.7)
WBC: 8.7 10*3/uL (ref 4.5–10.5)

## 2012-02-26 LAB — URINALYSIS
Ketones, ur: NEGATIVE
Leukocytes, UA: NEGATIVE
Specific Gravity, Urine: <=1.005 (ref 1.000–1.030)
Total Protein, Urine: NEGATIVE
Urine Glucose: NEGATIVE
pH: 5.5 (ref 5.0–8.0)

## 2012-02-26 LAB — HEPATIC FUNCTION PANEL
ALT: 31 U/L (ref 0–35)
Total Bilirubin: 0.7 mg/dL (ref 0.3–1.2)
Total Protein: 8 g/dL (ref 6.0–8.3)

## 2012-02-26 LAB — VITAMIN B12: Vitamin B-12: 349 pg/mL (ref 211–911)

## 2012-02-27 ENCOUNTER — Telehealth: Payer: Self-pay | Admitting: Internal Medicine

## 2012-02-27 MED ORDER — ERGOCALCIFEROL 1.25 MG (50000 UT) PO CAPS: 50000.0000 [IU] | ORAL_CAPSULE | ORAL | Status: DC

## 2012-02-27 MED ORDER — VITAMIN D 1000 UNITS PO TABS: 1000.0000 [IU] | ORAL_TABLET | Freq: Every day | ORAL | Status: DC

## 2012-02-27 NOTE — Telephone Encounter (Signed)
[name], please, inform patient that all labs are normal except for low vit D Start Rx vit D folllowed by OTC vit D Thx

## 2012-02-28 ENCOUNTER — Ambulatory Visit (HOSPITAL_COMMUNITY)
Admission: RE | Admit: 2012-02-28 | Discharge: 2012-02-28 | Disposition: A | Discharge status: Home / Self Care | Payer: Self-pay | Source: Ambulatory Visit | Attending: Internal Medicine | Admitting: Internal Medicine

## 2012-02-28 DIAGNOSIS — M5020 Other cervical disc displacement, unspecified cervical region: Secondary | ICD-10-CM | POA: Insufficient documentation

## 2012-03-03 ENCOUNTER — Telehealth: Payer: Self-pay | Admitting: Internal Medicine

## 2012-03-03 ENCOUNTER — Other Ambulatory Visit: Payer: Self-pay | Admitting: *Deleted

## 2012-03-03 DIAGNOSIS — M542 Cervicalgia: Secondary | ICD-10-CM

## 2012-03-03 DIAGNOSIS — M5412 Radiculopathy, cervical region: Secondary | ICD-10-CM

## 2012-03-03 NOTE — Telephone Encounter (Signed)
Message copied by Janeal Holmes on Tue Mar 03, 2012  1:19 PM ------      Message from: Anselm Jungling      Created: Mon Mar 02, 2012  8:08 AM                   ----- Message -----         From: Rad Results In Interface         Sent: 02/28/2012   2:54 PM           To: Anselm Jungling, CMA

## 2012-03-03 NOTE — Telephone Encounter (Signed)
Paula [name], please, inform patient that her C-spine MRI is abnormal with a nerve compression - she needs to see her NS. Is it ok to ref? Thx

## 2012-03-03 NOTE — Telephone Encounter (Signed)
Left mess for patient to call back.  

## 2012-03-04 NOTE — Telephone Encounter (Signed)
Pt informed of MRI Results-pt would like referral.

## 2012-03-04 NOTE — Telephone Encounter (Signed)
Pt informed of lab results, copy of results mailed to pt per her request.

## 2012-03-04 NOTE — Telephone Encounter (Signed)
ok 

## 2012-03-06 ENCOUNTER — Other Ambulatory Visit (HOSPITAL_COMMUNITY): Payer: Self-pay

## 2012-03-09 ENCOUNTER — Emergency Department (HOSPITAL_COMMUNITY)
Admission: EM | Admit: 2012-03-09 | Discharge: 2012-03-10 | Disposition: A | Discharge status: Home / Self Care | Payer: Self-pay | Attending: Emergency Medicine | Admitting: Emergency Medicine

## 2012-03-09 ENCOUNTER — Encounter (HOSPITAL_COMMUNITY): Payer: Self-pay | Admitting: *Deleted

## 2012-03-09 DIAGNOSIS — I10 Essential (primary) hypertension: Secondary | ICD-10-CM | POA: Insufficient documentation

## 2012-03-09 DIAGNOSIS — E785 Hyperlipidemia, unspecified: Secondary | ICD-10-CM | POA: Insufficient documentation

## 2012-03-09 DIAGNOSIS — M719 Bursopathy, unspecified: Secondary | ICD-10-CM | POA: Insufficient documentation

## 2012-03-09 DIAGNOSIS — M25519 Pain in unspecified shoulder: Secondary | ICD-10-CM | POA: Insufficient documentation

## 2012-03-09 DIAGNOSIS — K219 Gastro-esophageal reflux disease without esophagitis: Secondary | ICD-10-CM | POA: Insufficient documentation

## 2012-03-09 DIAGNOSIS — M67919 Unspecified disorder of synovium and tendon, unspecified shoulder: Secondary | ICD-10-CM | POA: Insufficient documentation

## 2012-03-09 DIAGNOSIS — M75101 Unspecified rotator cuff tear or rupture of right shoulder, not specified as traumatic: Secondary | ICD-10-CM

## 2012-03-09 NOTE — ED Notes (Signed)
The pt has had rt shoulder sharp stabbing pains all day with rt hand swelling.  No  Known injury. She has had some c-spine fusion in the past

## 2012-03-10 ENCOUNTER — Emergency Department (HOSPITAL_COMMUNITY): Payer: Self-pay

## 2012-03-10 MED ORDER — MORPHINE SULFATE 4 MG/ML IJ SOLN
6.0000 mg | Freq: Once | INTRAMUSCULAR | Status: AC
  Administered 2012-03-10: 6 mg via INTRAMUSCULAR
  Filled 2012-03-10: qty 2

## 2012-03-10 NOTE — Discharge Instructions (Signed)
Please continue to use rest, ice, compression and elevation to help with your symptoms. Please followup with your primary care provider or an orthopedic specialist for continued evaluation and treatment of your shoulder pains.  Shoulder Pain The shoulder is a ball and socket joint. The muscles and tendons (rotator cuff) are what keep the shoulder in its joint and stable. This collection of muscles and tendons holds in the head (ball) of the humerus (upper arm bone) in the fossa (cup) of the scapula (shoulder blade). Today no reason was found for your shoulder pain. Often pain in the shoulder may be treated conservatively with temporary immobilization. For example, holding the shoulder in one place using a sling for rest. Physical therapy may be needed if problems continue. HOME CARE INSTRUCTIONS   Apply ice to the sore area for 15 to 20 minutes, 3 to 4 times per day for the first 2 days. Put the ice in a plastic bag. Place a towel between the bag of ice and your skin.   If you have or were given a shoulder sling and straps, do not remove for as long as directed by your caregiver or until you see a caregiver for a follow-up examination. If you need to remove it to shower or bathe, move your arm as little as possible.   Sleep on several pillows at night to lessen swelling and pain.   Only take over-the-counter or prescription medicines for pain, discomfort, or fever as directed by your caregiver.   Keep any follow-up appointments in order to avoid any type of permanent shoulder disability or chronic pain problems.  SEEK MEDICAL CARE IF:   Pain in your shoulder increases or new pain develops in your arm, hand, or fingers.   Your hand or fingers are colder than your other hand.   You do not obtain pain relief with the medications or your pain becomes worse.  SEEK IMMEDIATE MEDICAL CARE IF:   Your arm, hand, or fingers are numb or tingling.   Your arm, hand, or fingers are swollen, painful, or  turn white or blue.   You develop chest pain or shortness of breath.  MAKE SURE YOU:   Understand these instructions.   Will watch your condition.   Will get help right away if you are not doing well or get worse.  Document Released: 08/28/2005 Document Revised: 11/07/2011 Document Reviewed: 11/02/2011 [facility] Patient Information 2012 [place], [place].   RESOURCE GUIDE  Dental Problems  Patients with Medicaid: [facility] [phone]                                           [phone] [address] Phone:  [phone]                                                  Phone:  [phone]  If unable to pay or uninsured, contact:  [facility] or [facility]. to become qualified for the adult dental clinic.  Chronic Pain Problems Contact [facility]  [phone] Patients need to be referred by their  primary care doctor.  Insufficient Money for Medicine Contact [employer]:  call "211" or [facility] 239-124-3211.  No Primary Care Doctor Call [facility]  [phone] Other agencies that provide inexpensive medical care    [facility]  [phone]    [facility]  [phone]    [facility]  [phone]    [facility]  [phone]    [facility]  [phone]    [facility]  [phone]  Psychological Services [facility]  [phone] [facility] Services  [phone] [facility]   [phone] (emergency services [phone])  Substance Abuse Resources [facility]  [phone] [facility] [phone] [facility] [place] [phone] Floydene Flock [phone] Residential & Outpatient Substance Abuse Program  ([phone]  Abuse/Neglect [facility] Child Abuse Hotline (430)432-5981 [facility] Child Abuse Hotline (228) 357-4291 (After Hours)  Emergency Shelter [facility]  Ministries [phone]  Maternity Homes [facility] [phone] [facility] [phone]  MRSA Hotline #:   2487196531    [facility] Resources  [facility]     [employer]                          [facility] Dept. [address] [place]                       [address]      [address]  Blondell Reveal Phone:  [phone]                                   Phone:  [phone]                 Phone:  [phone]  [facility] Mental Health Phone:  [phone]  [facility] Child Abuse Hotline [phone])303-2888 [phone] (After Hours)

## 2012-03-10 NOTE — ED Provider Notes (Signed)
History     CSN: [id number]  Arrival date & time 03/09/12  2028   First MD Initiated Contact with Patient 03/10/12 0110      Chief Complaint  Patient presents with  . Shoulder Pain    HPI  History provided by the patient. Patient is a 51 year old female with history of hypertension, hyperlipidemia, GERD who presents with complaints of right shoulder pains off and on for the past several months. Patient states that she first began having symptoms of right shoulder pain back in September when she was very active doing [place]. Patient states that she's been trying to rest and give time for her shoulder but has continued to have some pains. Pain has been especially bad over the past several weeks. Patient states she was seen by PCP office for similar symptoms. She was given tramadol for her symptoms and Flexeril. She reports only mild released. Patient also reports having some associated swelling to the shoulder area. Pain is worse with any kind of movements and with palpation. Patient denies any aggravating or alleviating factors.     Past Medical History  Diagnosis Date  . Allergy   . Anemia   . Anxiety   . Depression   . GERD (gastroesophageal reflux disease)   . Heart murmur   . Hyperlipidemia   . Hypertension   . Thyroid disease     nodule  . Ulcer     Past Surgical History  Procedure Date  . Cholecystectomy   . Cesarean section   . Cervical fusion     Family History  Problem Relation Age of Onset  . Colon cancer Maternal Grandfather   . Hypertension Maternal Grandfather   . Hyperlipidemia Mother   . Hypertension Mother   . Hypertension Maternal Grandmother   . Hypertension Brother   . Hypertension Sister     History  Substance Use Topics  . Smoking status: Never Smoker   . Smokeless tobacco: Never Used  . Alcohol Use: Yes     rarely    OB History    Grav Para Term Preterm Abortions TAB SAB Ect Mult Living   3 2 2  1 1    2       Review of Systems    Constitutional: Negative for fever and chills.  HENT: Negative for neck pain and neck stiffness.   Respiratory: Negative for cough and shortness of breath.   Cardiovascular: Negative for chest pain.  Gastrointestinal: Negative for nausea.  Musculoskeletal: Positive for joint swelling. Negative for back pain.  Skin: Negative for rash.    Allergies  Aspirin; Hydrocodone-acetaminophen; Moxifloxacin; and Oxycodone  Home Medications   Current Outpatient Rx  Name Route Sig Dispense Refill  . OMEPRAZOLE 20 MG PO CPDR Oral Take 20 mg by mouth at bedtime.     . TRAMADOL HCL 50 MG PO TABS Oral Take 50 mg by mouth every 6 (six) hours as needed. Pain      BP 150/69  Pulse 75  Temp(Src) 98.1 F (36.7 C) (Oral)  Resp 16  SpO2 98%  Physical Exam  Nursing note and vitals reviewed. Constitutional: She is oriented to person, place, and time. She appears well-developed and well-nourished. No distress.  HENT:  Head: Normocephalic.  Neck: Normal range of motion. Neck supple.       Mild tenderness to palpation over right trapezius.  Cardiovascular: Normal rate and regular rhythm.   Pulmonary/Chest: Effort normal and breath sounds normal.  Abdominal: Soft.  Musculoskeletal:  Limited range of motion right shoulder secondary to pain. Patient with improved passive range of motion but is still significantly reduced especially with abduction. No deformity. Tenderness to palpation over anterior shoulder and near a.c. Joint. No crepitus. Normal radial pulse, distal sensation and cap refill in hand. Normal range motion of elbow wrist and fingers.  Neurological: She is alert and oriented to person, place, and time.  Skin: Skin is warm and dry. No rash noted.  Psychiatric: She has a normal mood and affect. Her behavior is normal.    ED Course  Procedures     Dg Shoulder Right  03/10/2012  *RADIOLOGY REPORT*  Clinical Data: Worsening right shoulder pain.  RIGHT SHOULDER - 2+ VIEW  Comparison:  None.  Findings: There is no evidence of fracture or dislocation.  The right humeral head is seated within the glenoid fossa.  The acromioclavicular joint is unremarkable in appearance.  No significant soft tissue abnormalities are seen.  The visualized portions of the right lung are clear.  IMPRESSION: No evidence of fracture or dislocation.  Original Report Authenticated By: Tonia Ghen[place], M.D.     1. Shoulder pain   2. Rotator cuff syndrome of right shoulder       MDM  1:10 AM patient seen and evaluated. Patient no acute distress.  Patient reports having some nausea vomiting symptoms from hydrocodone and oxycodone the past. At this time I discussed the patient treatment with morphine.  Pt feeling much better after meds.  She will follow up with PCP and ortho.    Angus Seller, [place] 03/10/12 1932

## 2012-03-10 NOTE — ED Notes (Signed)
Patient transported to X-ray and is now back again

## 2012-03-10 NOTE — ED Notes (Signed)
Ortho called back about sling and he will come

## 2012-03-11 ENCOUNTER — Encounter: Payer: Self-pay | Admitting: Internal Medicine

## 2012-03-11 ENCOUNTER — Ambulatory Visit (INDEPENDENT_AMBULATORY_CARE_PROVIDER_SITE_OTHER): Payer: Self-pay | Admitting: Internal Medicine

## 2012-03-11 VITALS — BP 112/82 | HR 84 | Temp 98.6°F | Resp 16 | Wt 222.0 lb

## 2012-03-11 DIAGNOSIS — M79601 Pain in right arm: Secondary | ICD-10-CM

## 2012-03-11 DIAGNOSIS — M79609 Pain in unspecified limb: Secondary | ICD-10-CM

## 2012-03-11 DIAGNOSIS — M542 Cervicalgia: Secondary | ICD-10-CM

## 2012-03-11 DIAGNOSIS — M25511 Pain in right shoulder: Secondary | ICD-10-CM

## 2012-03-11 DIAGNOSIS — M25519 Pain in unspecified shoulder: Secondary | ICD-10-CM

## 2012-03-11 NOTE — Patient Instructions (Signed)
Postprocedure instructions :    A Band-Aid should be left on for 12 hours. Injection therapy is not a cure itself. It is used in conjunction with other modalities. You can use nonsteroidal anti-inflammatories like ibuprofen , hot and cold compresses. Rest is recommended in the next 24 hours. You need to report immediately  if fever, chills or any signs of infection develop. 

## 2012-03-11 NOTE — Assessment & Plan Note (Signed)
chronic - sx's sound like cervical radiculopathy sx's S/p fusion 5 y ago Dr Mikal Plane NS consult MRI 02/28/12 -- IMPRESSION:  1. Central disc protrusion at C3-4 contacts and indents the  ventral cord. Foramina are open.  2. Central protrusion at C2-3 contacts and mildly indents the  ventral cord but causes only mild central canal narrowing.  4. Status post C4-7 fusion and C5-6 corpectomy. Central canal and  foramina appear open at the surgical levels.  Original Report Authenticated By: Bernadene Bell. Maricela Curet, M.D.

## 2012-03-11 NOTE — Assessment & Plan Note (Signed)
1/13 chronic - sx's sound like cervical radiculopathy sx's S/p fusion 5 y ago Dr Mikal Plane

## 2012-03-11 NOTE — Assessment & Plan Note (Signed)
Subacr bursitis vs radiculopathy related 4/13 Will inject - diagn and therapeutic

## 2012-03-11 NOTE — Progress Notes (Signed)
Patient ID: Paula [name], female   DOB: 03-06-1961, 51 y.o.   MRN: [id number] Patient ID: Paula [name], female   DOB: [birth year], 51 y.o.   MRN: [id number]  Subjective:    Patient ID: Paula [name], female    DOB: 04-03-1961, 51 y.o.   MRN: [id number]  HPI  C/o ongoing R trap, neck and shoulder pain going down the arm into the hand x 2-3 wks, severe at times at the level of 8/10 F/u ER visit for severe R shoulder pain  Wt Readings from Last 3 Encounters:  03/11/12 222 lb (100.699 kg)  02/25/12 224 lb (101.606 kg)  02/18/12 220 lb 1.6 oz (99.837 kg)   BP Readings from Last 3 Encounters:  03/11/12 112/82  03/10/12 160/101  02/25/12 120/70     Review of Systems  Constitutional: Positive for diaphoresis and fatigue. Negative for fever and unexpected weight change.  HENT: Positive for neck pain and neck stiffness. Negative for ear discharge.   Eyes: Negative for redness.  Respiratory: Negative for chest tightness and shortness of breath.   Cardiovascular: Negative for leg swelling.  Gastrointestinal: Negative for nausea.  Genitourinary: Negative for dysuria, urgency, hematuria, flank pain, difficulty urinating and genital sores.  Musculoskeletal: Positive for back pain. Negative for joint swelling, arthralgias and gait problem.  Skin: Negative.  Negative for rash.  Neurological: Positive for numbness. Negative for dizziness, tremors, speech difficulty and headaches.  Hematological: Negative for adenopathy. Does not bruise/bleed easily.  Psychiatric/Behavioral: Negative for suicidal ideas, behavioral problems, sleep disturbance and decreased concentration. The patient is not nervous/anxious.        Objective:   Physical Exam  Constitutional: She appears well-developed.       Obese   Neck: No JVD present. No thyromegaly present.  Pulmonary/Chest: She exhibits no tenderness.  Abdominal: There is no tenderness.  Musculoskeletal: She exhibits tenderness. She exhibits no edema.        R trap and R shoulder, arm is tender; hand is tender DTRs and MS OK Neck is tender with ROM  Lymphadenopathy:    She has no cervical adenopathy.  Neurological: Coordination normal.  R subacr - hurts  Lab Results  Component Value Date   WBC 8.7 02/26/2012   HGB 13.1 02/26/2012   HCT 39.3 02/26/2012   PLT 371.0 02/26/2012   GLUCOSE 94 02/26/2012   CHOL 261* 01/08/2010   TRIG 210.0* 01/08/2010   HDL 51.40 01/08/2010   LDLDIRECT 170.1 01/08/2010   ALT 31 02/26/2012   AST 28 02/26/2012   NA 137 02/26/2012   K 4.0 02/26/2012   CL 102 02/26/2012   CREATININE 0.6 02/26/2012   BUN 11 02/26/2012   CO2 23 02/26/2012   TSH 1.13 02/26/2012   HGBA1C 5.2 01/08/2010     Procedure :Joint Injection, R  shoulder   Indication:  Subacromial bursitis with refractory  chronic pain.   Risks including unsuccessful procedure , bleeding, infection, bruising, skin atrophy and others were explained to the patient in detail as well as the benefits. Informed consent was obtained and signed.   Tthe patient was placed in a comfortable position. Lateral approach was used. Skin was prepped with Betadine and alcohol . Then, a 5 cc syringe with a 2 inch long 24-gauge needle was used for a joint injection.. The needle was advanced  Into the subacromial space.The bursa was injected with 3 mL of 2% lidocaine and 40 mg of Depo-Medrol .  Band-Aid was applied.   Tolerated well. Complications:  None. Good pain relief following the procedure.   Postprocedure instructions :    A Band-Aid should be left on for 12 hours. Injection therapy is not a cure itself. It is used in conjunction with other modalities. You can use nonsteroidal anti-inflammatories like ibuprofen , hot and cold compresses. Rest is recommended in the next 24 hours. You need to report immediately  if fever, chills or any signs of infection develop.       Assessment & Plan:

## 2012-03-11 NOTE — ED Provider Notes (Signed)
Medical screening examination/treatment/procedure(s) were performed by non-physician practitioner and as supervising physician I was immediately available for consultation/collaboration.   Vida Roller, MD 03/11/12 [phone]

## 2012-03-16 ENCOUNTER — Encounter: Payer: Self-pay | Admitting: Neurology

## 2012-03-16 MED ORDER — METHYLPREDNISOLONE ACETATE 80 MG/ML IJ SUSP: 40.0000 mg | Freq: Once | INTRAMUSCULAR | Status: DC

## 2012-03-30 ENCOUNTER — Encounter: Payer: Self-pay | Admitting: Internal Medicine

## 2012-03-30 ENCOUNTER — Ambulatory Visit (INDEPENDENT_AMBULATORY_CARE_PROVIDER_SITE_OTHER): Payer: Self-pay | Admitting: Internal Medicine

## 2012-03-30 VITALS — BP 122/78 | HR 68 | Ht 64.8 in | Wt 224.0 lb

## 2012-03-30 DIAGNOSIS — E669 Obesity, unspecified: Secondary | ICD-10-CM

## 2012-03-30 DIAGNOSIS — K219 Gastro-esophageal reflux disease without esophagitis: Secondary | ICD-10-CM

## 2012-03-30 DIAGNOSIS — R110 Nausea: Secondary | ICD-10-CM

## 2012-03-30 DIAGNOSIS — R1013 Epigastric pain: Secondary | ICD-10-CM

## 2012-03-30 DIAGNOSIS — K589 Irritable bowel syndrome without diarrhea: Secondary | ICD-10-CM

## 2012-03-30 NOTE — Patient Instructions (Signed)
You have been scheduled for an endoscopy with propofol. Please follow written instructions given to you at your visit today.  We have given you samples of Align. This puts good bacteria back into your colon. You should take 1 capsule by mouth once daily. If this works well for you, it can be purchased over the counter.  We have also given you samples of Dexilant

## 2012-03-30 NOTE — Progress Notes (Signed)
HISTORY OF PRESENT ILLNESS:  Paula [name] is a 50 y.o. female with hypertension, hyperlipidemia, anxiety/depression, and obesity. She is followed in this office for GERD, chronic functional abdominal pain, and irritable bowel syndrome with a tendency toward diarrhea predominance. She accompanied by her husband, Paula [name]. Today, she has several complaints. First, worsening reflux as manifested by heartburn. She is taking Prilosec 20 mg daily. She states that her lack of adequate health insurance will allow her to purchase medications. Thus, she relies on professional samples. She has had ongoing weight gain. Obesity is a second complaint. Her weight is up about 10-15 pounds over the past 18 months. Next, she complains of epigastric pain. This is chronic and associated with bloating. She has a new complaint of nausea with rare vomiting. Her only new medication is tramadol which he has been taking in recent weeks to months for neck and shoulder pain. As part of an IBS study. She underwent complete colonoscopy July 25th 2012. This revealed moderate diverticulosis, but was otherwise normal. It appears that her last upper endoscopy was in 2005. Currently, she denies dysphagia. She does however states that her upper abdominal pain is worse than usual, the similar in characteristic. She requests an upper endoscopy. She also mentions increased gas as well as diarrhea (treated with antidiarrheals) followed by constipation.  REVIEW OF SYSTEMS:  All non-GI ROS negative except for fatigue, neck pain, shoulder pain, anxiety/depression.  Past Medical History  Diagnosis Date  . Allergy   . Anemia   . Anxiety   . Depression   . GERD (gastroesophageal reflux disease)   . Heart murmur   . Hyperlipidemia   . Hypertension   . Thyroid disease     nodule  . Ulcer   . Diverticulosis   . Sleep apnea     on C-pap machine    Past Surgical History  Procedure Date  . Cholecystectomy   . Cesarean section   .  Cervical fusion   . Inguinal hernia repair   . Partial hysterectomy     Social History Paula [name]  reports that she has never smoked. She has never used smokeless tobacco. She reports that she drinks alcohol. She reports that she does not use illicit drugs.  family history includes Colon cancer in her maternal grandfather; Hyperlipidemia in her mother; and Hypertension in her brother, maternal grandfather, maternal grandmother, mother, and sister.  Allergies  Allergen Reactions  . Aspirin Other (See Comments)    ULCERS  . Hydrocodone-Acetaminophen Nausea And Vomiting  . Moxifloxacin Itching  . Oxycodone Nausea And Vomiting       PHYSICAL EXAMINATION:  Vital signs: BP 122/78  Pulse 68  Ht 5' 4.8" (1.646 m)  Wt 224 lb (101.606 kg)  BMI 37.51 kg/m2  SpO2 98% General: Well-developed, well-nourished, no acute distress. Obese HEENT: Sclerae are anicteric, conjunctiva pink. Oral mucosa intact Lungs: Clear Heart: Regular Abdomen: soft,obese,tender with minimal palpation throughout, but mostly in the epigastric region, nondistended, no obvious ascites, no peritoneal signs, normal bowel sounds. No organomegaly. No succussion splash Extremities: No edema Psychiatric: alert and oriented x3. Cooperative    ASSESSMENT:  #1. GERD. Breakthrough symptoms with omeprazole 20 mg daily #2. Chronic epigastric discomfort. Worsening. Possibly related to GERD. Rule out ulcers. #3. Irritable bowel syndrome. Ongoing #4. Diverticulosis on colonoscopy July 2012 #5. Obesity   PLAN:  #1. Reflux cautions with attention to weight loss #2. Given 3-4 weeks of Dexilant samples, 60 mg daily #3. Schedule diagnostic upper endoscopy to  evaluate pain.The nature of the procedure, as well as the risks, benefits, and alternatives were carefully and thoroughly reviewed with the patient. Ample time for discussion and questions allowed. The patient understood, was satisfied, and agreed to proceed.  #4.  Samples of Protonix Align, one daily for 3 weeks, to see if this helps with IBS symptoms #5. Sensible exercise and diet. Consider fitness trainer and [employer] #6. Ongoing general medical care with Dr. Posey Rea

## 2012-04-08 ENCOUNTER — Ambulatory Visit (AMBULATORY_SURGERY_CENTER): Payer: Self-pay | Admitting: Internal Medicine

## 2012-04-08 ENCOUNTER — Encounter: Payer: Self-pay | Admitting: Internal Medicine

## 2012-04-08 VITALS — BP 150/85 | HR 62 | Temp 97.4°F | Resp 16 | Ht 64.0 in | Wt 224.0 lb

## 2012-04-08 DIAGNOSIS — R1013 Epigastric pain: Secondary | ICD-10-CM

## 2012-04-08 DIAGNOSIS — K219 Gastro-esophageal reflux disease without esophagitis: Secondary | ICD-10-CM

## 2012-04-08 MED ORDER — SODIUM CHLORIDE 0.9 % IV SOLN: 500.0000 mL | INTRAVENOUS | Status: DC

## 2012-04-08 NOTE — Progress Notes (Signed)
Patient did not experience any of the following events: a burn prior to discharge; a fall within the facility; wrong site/side/patient/procedure/implant event; or a hospital transfer or hospital admission upon discharge from the facility. (G8907) Patient did not have preoperative order for IV antibiotic SSI prophylaxis. (G8918)  

## 2012-04-08 NOTE — Op Note (Signed)
[facility] [address] [place], [place]  [address]  ENDOSCOPY PROCEDURE REPORT  PATIENT:  Paula [name], Paula [name]  MR#:  [id number] BIRTHDATE:  1961/07/31, 50 yrs. old  GENDER:  female  ENDOSCOPIST:  Wilhemina Bonito. Eda Keys, MD Referred by:  Office /  PROCEDURE DATE:  04/08/2012 PROCEDURE:  EGD, diagnostic 43235 ASA CLASS:  Class II INDICATIONS:  epigastric pain  MEDICATIONS:   MAC sedation, administered by CRNA, propofol (Diprivan) 150 mg IV TOPICAL ANESTHETIC:  none  DESCRIPTION OF PROCEDURE:   After the risks benefits and alternatives of the procedure were thoroughly explained, informed consent was obtained.  The LB GIF-H180 [id number] endoscope was introduced through the mouth and advanced to the second portion of the duodenum, without limitations.  The instrument was slowly withdrawn as the mucosa was fully examined. <<PROCEDUREIMAGES>>  The upper, middle, and distal third of the esophagus were carefully inspected and no abnormalities were noted. The z-line was well seen at the GEJ. The endoscope was pushed into the fundus which was normal including a retroflexed view. The antrum,gastric body, first and second part of the duodenum were unremarkable. Retroflexed views revealed a hiatal hernia.    The scope was then withdrawn from the patient and the procedure completed.  COMPLICATIONS:  None  ENDOSCOPIC IMPRESSION: 1) Normal EGD 2) A hiatal hernia 3) Gerd 4) Functional dyspepsia  RECOMMENDATIONS: 1) Anti-reflux regimen to be followed 2) continue current medications  ______________________________ Wilhemina Bonito. Eda Keys, MD  CC:  Linda Hedges. Plotnikov, MD;  The Patient  n. eSIGNED:   Wilhemina Bonito. Eda Keys at 04/08/2012 12:09 PM  [place] [facility], [phone]

## 2012-04-08 NOTE — Patient Instructions (Signed)

## 2012-04-09 ENCOUNTER — Telehealth: Payer: Self-pay | Admitting: *Deleted

## 2012-04-09 NOTE — Telephone Encounter (Signed)
  Follow up Call-  Call back number 04/08/2012 06/26/2011  Post procedure Call Back phone  # [phone]  Permission to leave phone message Yes -     Patient questions:  Do you have a fever, pain , or abdominal swelling? no Pain Score  0 *  Have you tolerated food without any problems? yes  Have you been able to return to your normal activities? yes  Do you have any questions about your discharge instructions: Diet   no Medications  no Follow up visit  no  Do you have questions or concerns about your Care? no  Actions: * If pain score is 4 or above: No action needed, pain <4.

## 2012-04-14 ENCOUNTER — Ambulatory Visit (INDEPENDENT_AMBULATORY_CARE_PROVIDER_SITE_OTHER): Payer: Self-pay

## 2012-04-14 DIAGNOSIS — R197 Diarrhea, unspecified: Secondary | ICD-10-CM

## 2012-04-29 ENCOUNTER — Ambulatory Visit (INDEPENDENT_AMBULATORY_CARE_PROVIDER_SITE_OTHER): Payer: Self-pay

## 2012-04-29 DIAGNOSIS — R197 Diarrhea, unspecified: Secondary | ICD-10-CM

## 2012-05-04 ENCOUNTER — Telehealth: Payer: Self-pay | Admitting: Internal Medicine

## 2012-05-04 NOTE — Telephone Encounter (Signed)
Spoke with PCP who advised that pt should try to follow up with gastro research since pt stated symptoms from research medication. Patient notified and transferred call to [facility] in GI. Per GI, pt main concern was dizziness and expired meclizine, they feel that this is not related to research medication and referred back for PCP to address.

## 2012-05-04 NOTE — Telephone Encounter (Signed)
Agree, Thx

## 2012-05-04 NOTE — Telephone Encounter (Signed)
PT WANTS A REFERRAL TO NEURO REHAB ON [address] [place]. SHE IS HAVING VERTIGO SINCE THURS.  SHE HAS HAD THIS OFF AND ON FOR A COUPLE OF YEARS.

## 2012-05-04 NOTE — Telephone Encounter (Signed)
Take Meclizine - ok to renew OV w/any MD if needed Thx

## 2012-05-04 NOTE — Telephone Encounter (Signed)
Caller: Paula [name]/Patient; PCP: Plotnikov, Alex; CB#: [phone]; Call regarding Headache;  Dayanira is in a research program for IBS. Started medication on 04/28/12.  Developed dizziness, headache, pressure behind eyes, nausea, and diarrhea on 04/30/12.  Headache is the most persistent symptom.  Afebrile.  Has had change in mental status, speech and vision during the past 2 months.  Utilized Headache Guideline.  See PCP within 24 hrs disposition.  No appts available today or tomorrow. Called office and spoke with [place].  Transferred to office.

## 2012-05-04 NOTE — Telephone Encounter (Signed)
Please advise as Research group is not advising pt since they feel symptoms are not related to research drug. OV or Rx? Also, PT WANTS A REFERRAL TO NEURO REHAB ON [address] [place].

## 2012-05-06 MED ORDER — MECLIZINE HCL 12.5 MG PO TABS: 12.5000 mg | ORAL_TABLET | Freq: Three times a day (TID) | ORAL | Status: AC | PRN

## 2012-05-06 NOTE — Telephone Encounter (Signed)
Rx emailed Thx 

## 2012-05-06 NOTE — Telephone Encounter (Signed)
Please advise on sig/quantity of meclizine. And referral

## 2012-05-06 NOTE — Telephone Encounter (Signed)
Please call into [facility] on [address].  She will try to wait until her appt on June 24 to see AVP.

## 2012-05-14 ENCOUNTER — Ambulatory Visit: Payer: Self-pay

## 2012-05-19 ENCOUNTER — Ambulatory Visit (INDEPENDENT_AMBULATORY_CARE_PROVIDER_SITE_OTHER): Payer: Self-pay | Admitting: Neurology

## 2012-05-19 ENCOUNTER — Encounter: Payer: Self-pay | Admitting: Neurology

## 2012-05-19 VITALS — BP 148/98 | HR 88 | Wt 221.0 lb

## 2012-05-19 DIAGNOSIS — M79609 Pain in unspecified limb: Secondary | ICD-10-CM

## 2012-05-19 DIAGNOSIS — M545 Low back pain, unspecified: Secondary | ICD-10-CM

## 2012-05-19 DIAGNOSIS — H8110 Benign paroxysmal vertigo, unspecified ear: Secondary | ICD-10-CM

## 2012-05-19 DIAGNOSIS — M542 Cervicalgia: Secondary | ICD-10-CM

## 2012-05-19 DIAGNOSIS — M79601 Pain in right arm: Secondary | ICD-10-CM

## 2012-05-19 MED ORDER — TIZANIDINE HCL 4 MG PO CAPS: 4.0000 mg | ORAL_CAPSULE | Freq: Three times a day (TID) | ORAL | Status: DC | PRN

## 2012-05-19 NOTE — Patient Instructions (Addendum)
Your MRI is scheduled for Thursday, June 20 at 12:00 noon.   [facility] by 1:45am.  [phone].  [facility] will call you directly to schedule your appointments for PT.  [address].  [phone].  [facility] will call to schedule your vestibular therapy appointments.  [address] [phone].  We will call you regarding your appointment for the nerve conductions studies.  Mostly likely they will be done at [facility] which shares a building with the [facility] office [address] [phone].  We will see you back on August 23 at 2:30pm.

## 2012-05-19 NOTE — Progress Notes (Signed)
Dear Dr. Posey Rea,  Thank you for having me see Paula [name] in consultation today at [facility] for her problem with right arm and neck pain.  As you may recall, she is a 51 y.o. year old female with a history of a C4-C7 fusion for myelopathy in 2005 who has had since September of this year worsening neck and right arm pain with numbness radiating to her hand.  She says it was caused by exercising in [place].  She describes it worse in her first three digits.  She has had similar pain before but it got better after physical therapy.  She is using tramadol for the pain without much relief.  Flexeril is not helping.  She had an MRI C-spine which did not show any obvious neural impingement except for some central disk protrusion with mild indentation of the cord at C2-C4.  The pain is not worse at night.  She is unable to work because of the pain.  She has had an intraarticular steroid shot into the right shoulder by yourself without relief.  She cannot take NSAIDs due to her stomach.  She has not tried gabapentin.  She is claiming disability because of the arm.  She also gets throbbing pain into her legs, worse with sitting causing cramping at night.  She denies pain with walking, but rather has pain shooting into her legs after walking.    [facility] so she can get some investigations.  Past Medical History  Diagnosis Date  . Allergy   . Anemia   . Anxiety   . Depression   . GERD (gastroesophageal reflux disease)   . Heart murmur   . Hyperlipidemia   . Hypertension   . Thyroid disease     nodule  . Ulcer   . Diverticulosis   . Sleep apnea     on C-pap machine  . Arthritis   . Vertigo     Past Surgical History  Procedure Date  . Cholecystectomy   . Cesarean section   . Cervical fusion   . Inguinal hernia repair   . Partial hysterectomy     History   Social History  . Marital Status: Single    Spouse Name: N/A    Number of  Children: N/A  . Years of Education: N/A   Social History Main Topics  . Smoking status: Former Smoker    Quit date: 05/19/2010  . Smokeless tobacco: Never Used  . Alcohol Use: Yes     rarely  . Drug Use: No  . Sexually Active: Yes    Birth Control/ Protection: Surgical   Other Topics Concern  . None   Social History Narrative  . None    Family History  Problem Relation Age of Onset  . Colon cancer Maternal Grandfather   . Hypertension Maternal Grandfather   . Hyperlipidemia Mother   . Hypertension Mother   . Hypertension Maternal Grandmother   . Hypertension Brother   . Hypertension Sister     Current Outpatient Prescriptions on File Prior to Visit  Medication Sig Dispense Refill  . cholecalciferol (VITAMIN D) 1000 UNITS tablet Take 1,000 Units by mouth daily.      [name] omeprazole (PRILOSEC) 20 MG capsule Take 20 mg by mouth at bedtime.       . traMADol (ULTRAM) 50 MG tablet Take 50 mg by mouth every 6 (six) hours as needed. Pain       Current  Facility-Administered Medications on File Prior to Visit  Medication Dose Route Frequency Provider Last Rate Last Dose  . methylPREDNISolone acetate (DEPO-MEDROL) injection 40 mg  40 mg Intra-articular Once Tresa Garter, MD        Allergies  Allergen Reactions  . Aspirin Other (See Comments)    ULCERS  . Hydrocodone-Acetaminophen Nausea And Vomiting  . Moxifloxacin Itching  . Oxycodone Nausea And Vomiting      ROS:  13 systems were reviewed and are notable for chronic dizziness - had a bout of vertigo about 5 years ago and has persistent.,  Worse with lying down to the right.  MRI brain at the time was unremarkable.  All other review of systems are unremarkable.   Examination:  Filed Vitals:   05/19/12 1018  BP: 148/98  Pulse: 88  Weight: 221 lb (100.245 kg)     In general, well nourished women.  Cardiovascular: The patient has a regular rate and rhythm and no carotid bruits.  Fundoscopy:  Disks are  flat. Vessel caliber within normal limits.  Mental status:   The patient is oriented to person, place and time. Recent and remote memory are intact. Attention span and concentration are normal. Language including repetition, naming, following commands are intact. Fund of knowledge of current and historical events, as well as vocabulary are normal.  Cranial Nerves: Pupils are equally round and reactive to light. Visual fields full to confrontation. Extraocular movements are intact without nystagmus. Facial sensation and muscles of mastication are intact. Muscles of facial expression are symmetric. Hearing intact to bilateral finger rub. Tongue protrusion, uvula, palate midline.  Shoulder shrug intact  Motor:  The patient has decreased bulk in her right deltoid and tone, no pronator drift.  There are no adventitious movements.  She cannot lift her arm above her shoulder on her own.  Otherwise I think she has full strength in the right arm muscles.  I question a degree of pain limiting the arm movement vs. actual muscle weakness(?frozen shoulder).  Full strength LE.  Reflexes:   Biceps  Triceps Brachioradialis Knee Ankle  Right 2+  2+  2+   2+ 2+  Left  2+  2+  2+   2+ 2+  Toes down  Coordination:  Normal finger to nose.  No dysdiadokinesia.  Sensation is decreased in a non dermatomal pattern in her right hand.  Does not seem to follow nerve distribution as well to pin prick.  Gait and Station are normal.  Tandem gait is intact.  Romberg is negative  SLR -; Spurling's -; Tinel's - at wrist.  D-H induces vertigo with head down to right, but no nystagmus    Impression/Recs: 1.  Right arm pain - Given her unremarkable MRI it is less likely to be a structural radiculopathy.  She does have wasting of the deltoid which of course makes me question an axillary nerve palsy as well, however, this would not account for her diffuse arm pain.  I am going to get an EMG/NCS of her RUE.  Median  neuropathy is also possible.  I will prescribe Zanaflex 4mg  tid to start.  She may benefit from gabapentin.  If she has radiculopathy on EMG then perhaps ESI might help as well.  I am going to send her to [facility] for her neck pain as well. 2.  Bilateral leg pain - MRI L-spine 3.  Vertigo - likely BPPV - vestibular Rehab.   We will see the patient back in 2  months.  Thank you for having [place] see Paula [name] in consultation.  Feel free to contact me with any questions.  Lupita Raider Modesto Charon, MD [facility], [place] [address] [place], [place] [address] Phone: [phone] Fax: [phone].

## 2012-05-21 ENCOUNTER — Ambulatory Visit (HOSPITAL_COMMUNITY)
Admission: RE | Admit: 2012-05-21 | Discharge: 2012-05-21 | Disposition: A | Discharge status: Home / Self Care | Payer: Self-pay | Source: Ambulatory Visit | Attending: Neurology | Admitting: Neurology

## 2012-05-21 DIAGNOSIS — R29898 Other symptoms and signs involving the musculoskeletal system: Secondary | ICD-10-CM | POA: Insufficient documentation

## 2012-05-21 DIAGNOSIS — M79609 Pain in unspecified limb: Secondary | ICD-10-CM | POA: Insufficient documentation

## 2012-05-21 DIAGNOSIS — M545 Low back pain, unspecified: Secondary | ICD-10-CM | POA: Insufficient documentation

## 2012-05-21 DIAGNOSIS — N9489 Other specified conditions associated with female genital organs and menstrual cycle: Secondary | ICD-10-CM | POA: Insufficient documentation

## 2012-05-25 ENCOUNTER — Ambulatory Visit (INDEPENDENT_AMBULATORY_CARE_PROVIDER_SITE_OTHER): Payer: Self-pay | Admitting: Internal Medicine

## 2012-05-25 ENCOUNTER — Encounter: Payer: Self-pay | Admitting: Internal Medicine

## 2012-05-25 VITALS — BP 128/72 | HR 76 | Temp 98.6°F | Resp 16 | Wt 219.0 lb

## 2012-05-25 DIAGNOSIS — M25519 Pain in unspecified shoulder: Secondary | ICD-10-CM

## 2012-05-25 DIAGNOSIS — R42 Dizziness and giddiness: Secondary | ICD-10-CM

## 2012-05-25 DIAGNOSIS — M79609 Pain in unspecified limb: Secondary | ICD-10-CM

## 2012-05-25 DIAGNOSIS — I10 Essential (primary) hypertension: Secondary | ICD-10-CM

## 2012-05-25 DIAGNOSIS — M79601 Pain in right arm: Secondary | ICD-10-CM

## 2012-05-25 DIAGNOSIS — N83209 Unspecified ovarian cyst, unspecified side: Secondary | ICD-10-CM

## 2012-05-25 DIAGNOSIS — M25511 Pain in right shoulder: Secondary | ICD-10-CM

## 2012-05-25 NOTE — Assessment & Plan Note (Signed)
Per Dr Modesto Charon - tests tomorrow

## 2012-05-25 NOTE — Assessment & Plan Note (Signed)
Continue with current prescription therapy as reflected on the Med list.  

## 2012-05-25 NOTE — Assessment & Plan Note (Signed)
1/13 chronic - sx's sound like cervical radiculopathy sx's S/p fusion 5 y ago Dr Mikal Plane Continue with current prescription therapy as reflected on the Med list. EMG is pending

## 2012-05-25 NOTE — Assessment & Plan Note (Addendum)
The injection helped some We can do another one later EMG is pending

## 2012-05-25 NOTE — Assessment & Plan Note (Signed)
Wt Readings from Last 3 Encounters:  05/25/12 219 lb (99.338 kg)  05/19/12 221 lb (100.245 kg)  04/08/12 224 lb (101.606 kg)

## 2012-05-25 NOTE — Progress Notes (Signed)
  Subjective:    Patient ID: Paula [name], female    DOB: [birth year], 51 y.o.   MRN: [id number]  HPI  C/o ongoing R trap, neck and shoulder pain going down the arm into the hand x 2-3 wks, severe at times at the level of 6-7/10 F/u  severe R shoulder pain - the shot has helped. EMG is scheduled  Wt Readings from Last 3 Encounters:  05/25/12 219 lb (99.338 kg)  05/19/12 221 lb (100.245 kg)  04/08/12 224 lb (101.606 kg)   BP Readings from Last 3 Encounters:  05/25/12 128/72  05/19/12 148/98  04/08/12 150/85     Review of Systems  Constitutional: Positive for diaphoresis and fatigue. Negative for unexpected weight change.  HENT: Positive for neck stiffness. Negative for ear discharge.   Respiratory: Negative for chest tightness and shortness of breath.   Cardiovascular: Negative for leg swelling.  Genitourinary: Negative for dysuria, urgency, hematuria, flank pain, difficulty urinating and genital sores.  Musculoskeletal: Negative for joint swelling, arthralgias and gait problem.  Skin: Negative.  Negative for rash.  Neurological: Negative for tremors and speech difficulty.  Hematological: Negative for adenopathy. Does not bruise/bleed easily.  Psychiatric/Behavioral: Negative for suicidal ideas, behavioral problems, disturbed wake/sleep cycle and decreased concentration. The patient is not nervous/anxious.        Objective:   Physical Exam  Constitutional: She appears well-developed.       Obese   Neck: No JVD present. No thyromegaly present.  Pulmonary/Chest: She exhibits no tenderness.  Abdominal: There is no tenderness.  Musculoskeletal: She exhibits tenderness. She exhibits no edema.       R trap and R shoulder, arm is tender; hand is tender DTRs and MS OK Neck is tender with ROM  Lymphadenopathy:    She has no cervical adenopathy.  Neurological: Coordination normal.  R subacr - hurts too  Lab Results  Component Value Date   WBC 8.7 02/26/2012   HGB 13.1  02/26/2012   HCT 39.3 02/26/2012   PLT 371.0 02/26/2012   GLUCOSE 94 02/26/2012   CHOL 261* 01/08/2010   TRIG 210.0* 01/08/2010   HDL 51.40 01/08/2010   LDLDIRECT 170.1 01/08/2010   ALT 31 02/26/2012   AST 28 02/26/2012   NA 137 02/26/2012   K 4.0 02/26/2012   CL 102 02/26/2012   CREATININE 0.6 02/26/2012   BUN 11 02/26/2012   CO2 23 02/26/2012   TSH 1.13 02/26/2012   HGBA1C 5.2 01/08/2010        Assessment & Plan:

## 2012-05-25 NOTE — Assessment & Plan Note (Signed)
6/13 LS MRI: IMPRESSION:  1. Minimal spondylosis. No evidence of spinal stenosis or nerve  root encroachment.  2. Partially imaged multi septated left adnexal cystic process.  Cystic ovarian neoplasm cannot be excluded, especially if the  patient is post menopausal. Further evaluation with pelvic  ultrasound recommended.  These results will be called to the ordering clinician or  representative by the Radiologist Assistant, and communication  documented in the PACS Dashboard.  Original Report Authenticated By: Gerrianne Scale, M.D.  Will get an [place]

## 2012-05-26 ENCOUNTER — Ambulatory Visit: Payer: Self-pay | Attending: Neurology | Admitting: Rehabilitative and Restorative Service Providers"

## 2012-05-26 DIAGNOSIS — R269 Unspecified abnormalities of gait and mobility: Secondary | ICD-10-CM | POA: Insufficient documentation

## 2012-05-26 DIAGNOSIS — R42 Dizziness and giddiness: Secondary | ICD-10-CM | POA: Insufficient documentation

## 2012-05-26 DIAGNOSIS — IMO0001 Reserved for inherently not codable concepts without codable children: Secondary | ICD-10-CM | POA: Insufficient documentation

## 2012-05-28 ENCOUNTER — Other Ambulatory Visit: Payer: Self-pay | Admitting: *Deleted

## 2012-05-28 DIAGNOSIS — N83209 Unspecified ovarian cyst, unspecified side: Secondary | ICD-10-CM

## 2012-06-03 ENCOUNTER — Ambulatory Visit (HOSPITAL_COMMUNITY)
Admission: RE | Admit: 2012-06-03 | Discharge: 2012-06-03 | Disposition: A | Discharge status: Home / Self Care | Payer: Self-pay | Source: Ambulatory Visit | Attending: Internal Medicine | Admitting: Internal Medicine

## 2012-06-03 DIAGNOSIS — N83209 Unspecified ovarian cyst, unspecified side: Secondary | ICD-10-CM

## 2012-06-03 DIAGNOSIS — Z90710 Acquired absence of both cervix and uterus: Secondary | ICD-10-CM | POA: Insufficient documentation

## 2012-06-05 ENCOUNTER — Telehealth: Payer: Self-pay | Admitting: Internal Medicine

## 2012-06-05 NOTE — Telephone Encounter (Signed)
Paula [name], please, inform patient that her [place] is c/w benign cyst - we need to recheck [place] in 12 wks

## 2012-06-08 ENCOUNTER — Ambulatory Visit: Payer: Self-pay | Attending: Neurology | Admitting: Rehabilitative and Restorative Service Providers"

## 2012-06-08 ENCOUNTER — Telehealth: Payer: Self-pay | Admitting: Internal Medicine

## 2012-06-08 DIAGNOSIS — R269 Unspecified abnormalities of gait and mobility: Secondary | ICD-10-CM | POA: Insufficient documentation

## 2012-06-08 DIAGNOSIS — R42 Dizziness and giddiness: Secondary | ICD-10-CM | POA: Insufficient documentation

## 2012-06-08 DIAGNOSIS — N83209 Unspecified ovarian cyst, unspecified side: Secondary | ICD-10-CM

## 2012-06-08 DIAGNOSIS — IMO0001 Reserved for inherently not codable concepts without codable children: Secondary | ICD-10-CM | POA: Insufficient documentation

## 2012-06-08 NOTE — Telephone Encounter (Signed)
Caller: Demetria/Patient; PCP: Plotnikov, Alex;   CALLING TO FIND OUT MORE INFO ABOUT RESULTS OF US Done of Abdomen ON 05/28/12 AND MRI OF SPINE ON 05/25/12. PLEASE HAVE STACEY, CMA CALL HER BACK. CB#: [phone];

## 2012-06-08 NOTE — Telephone Encounter (Signed)
Left detailed mess informing pt of below.  

## 2012-06-09 NOTE — Telephone Encounter (Signed)
Left mess for patient to call back.  

## 2012-06-11 ENCOUNTER — Ambulatory Visit: Payer: Self-pay | Admitting: Rehabilitative and Restorative Service Providers"

## 2012-06-15 ENCOUNTER — Encounter: Payer: Self-pay | Admitting: Rehabilitative and Restorative Service Providers"

## 2012-06-18 ENCOUNTER — Ambulatory Visit: Payer: Self-pay | Admitting: Rehabilitative and Restorative Service Providers"

## 2012-06-18 ENCOUNTER — Ambulatory Visit (INDEPENDENT_AMBULATORY_CARE_PROVIDER_SITE_OTHER): Payer: Self-pay

## 2012-06-18 DIAGNOSIS — R197 Diarrhea, unspecified: Secondary | ICD-10-CM

## 2012-06-18 NOTE — Telephone Encounter (Signed)
I called pt re: below. She states she needs to do f/u U/S in 6 wks due to her current ins coverage will be ending in September. Also, she is requesting a brain MRI for her chronic HAs and sensitivity to light. Please advise.

## 2012-06-19 NOTE — Telephone Encounter (Signed)
[place] in Aug No need for a head MRI: she had a nl CT in 2011 and a nl MRI in 2008 Thx

## 2012-06-19 NOTE — Telephone Encounter (Signed)
Pt informed

## 2012-06-22 ENCOUNTER — Encounter: Payer: Self-pay | Admitting: Rehabilitative and Restorative Service Providers"

## 2012-06-23 ENCOUNTER — Telehealth: Payer: Self-pay | Admitting: *Deleted

## 2012-06-23 DIAGNOSIS — N83209 Unspecified ovarian cyst, unspecified side: Secondary | ICD-10-CM

## 2012-06-23 NOTE — Telephone Encounter (Signed)
Caller requesting clarification on U/S pelvis limited.  Should this be for a complete pelvis with transvaginal. Please advise or update order.

## 2012-06-24 NOTE — Telephone Encounter (Signed)
It is a f/u US to re-eval ovar cysts: what should I have ordered? Thx

## 2012-06-25 ENCOUNTER — Encounter: Payer: Self-pay | Admitting: Rehabilitative and Restorative Service Providers"

## 2012-06-29 ENCOUNTER — Encounter: Payer: Self-pay | Admitting: Rehabilitative and Restorative Service Providers"

## 2012-06-29 NOTE — Telephone Encounter (Signed)
It should be U/S complete w/ transvag. I corrected orders.

## 2012-07-02 ENCOUNTER — Encounter: Payer: Self-pay | Admitting: Rehabilitative and Restorative Service Providers"

## 2012-07-03 ENCOUNTER — Ambulatory Visit: Payer: Self-pay

## 2012-07-06 ENCOUNTER — Encounter: Payer: Self-pay | Admitting: Rehabilitative and Restorative Service Providers"

## 2012-07-09 ENCOUNTER — Encounter: Payer: Self-pay | Admitting: Rehabilitative and Restorative Service Providers"

## 2012-07-09 ENCOUNTER — Telehealth: Payer: Self-pay | Admitting: Neurology

## 2012-07-09 NOTE — Telephone Encounter (Signed)
Pt was referred to [facility] for NCS. Was unable to complete appointment because of financial issues. She called our office today to get Paula [name]'s information and hopefully set up the appointment.

## 2012-07-24 ENCOUNTER — Encounter: Payer: Self-pay | Admitting: Neurology

## 2012-07-24 ENCOUNTER — Ambulatory Visit (INDEPENDENT_AMBULATORY_CARE_PROVIDER_SITE_OTHER): Payer: Self-pay | Admitting: Neurology

## 2012-07-24 VITALS — BP 126/84 | HR 84 | Wt 223.0 lb

## 2012-07-24 DIAGNOSIS — M7500 Adhesive capsulitis of unspecified shoulder: Secondary | ICD-10-CM

## 2012-07-24 NOTE — Progress Notes (Signed)
Dear Dr. Posey Rea,  I saw  Paula [name] back in [facility] clinic for her problem with vertigo, right > left shoulder pain, and bilateral shooting leg pains .  As you may recall, she is a 51 y.o. year old female with a history of C4-C7 fusion for myelopathy in 2005 who has complained of right arm numbness and right debilitating shoulder pain.  She was also having positional vertigo at her last visit.  At her last visit I ordered an EMG/NCS of her right upper and lower extremity.  Unfortunately, because of unpaid bills for [facility], she was unable to get the test.  I did get an MRI of her L-spine that was quite unremarkable for a cause of her lower extremity pain and cramping.  In terms of the vertigo it responded to vestibular rehab, and it has remitted.  She is still having the shoulder pain, and arm numbness, although it has gotten better.  She says it is worse in the winter.  Her left shoulder has begun to hurt.   Medical history, social history, and family history were reviewed and have not changed since the last clinic visit.  Current Outpatient Prescriptions on File Prior to Visit  Medication Sig Dispense Refill  . cholecalciferol (VITAMIN D) 1000 UNITS tablet Take 1,000 Units by mouth daily.      [name] omeprazole (PRILOSEC) 20 MG capsule Take 20 mg by mouth at bedtime.       [name] tiZANidine (ZANAFLEX) 4 MG capsule Take 1 capsule (4 mg total) by mouth 3 (three) times daily as needed for muscle spasms.  90 capsule  1  . traMADol (ULTRAM) 50 MG tablet Take 50 mg by mouth every 6 (six) hours as needed. Pain       Current Facility-Administered Medications on File Prior to Visit  Medication Dose Route Frequency Provider Last Rate Last Dose  . methylPREDNISolone acetate (DEPO-MEDROL) injection 40 mg  40 mg Intra-articular Once Tresa Garter, MD        Allergies  Allergen Reactions  . Aspirin Other (See Comments)    ULCERS  . Hydrocodone-Acetaminophen Nausea And Vomiting  .  Moxifloxacin Itching  . Oxycodone Nausea And Vomiting    ROS:  13 systems were reviewed and are notable for bilateral throbbing leg pains when she sits for long periods of time.  All other review of systems are unremarkable.  Exam: . Filed Vitals:   07/24/12 1414  BP: 126/84  Pulse: 84  Weight: 223 lb (101.152 kg)    In general, well groomed appearing women.  Shoulder exam:  She clearly has wasting of the right deltoid.  However to palpation she has severe joint pain, and passive motion does not allow me to abduct the deltoid fully.  She also has pain to palpation of the left shoulder joint.    Motor:  In the upper extremities there is some mild giveaway on the right to shoulder abduction.  Also, some mild weakness of finger abduction in the right hand.  Clear wasting in the right deltoid  Reflexes:  2+ in UE, bilaterally.  Gait:  Normal gait and station.  Romberg negative.  Impression/Recommendations:  1.  Vertigo - BPPV resolved after vestibular therapy. 2.  Shoulder pain with right arm numbness - Without the EMG/NCS it is hard to be sure but I am now doubtful the primary process here is radicular.  She likely has some numbness from her previous spondylosis as she had right hand numbness originally when  she presented before neck surgery.  However, I think the main process is orthopedic compounded perhaps by an old radiculopathy.  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    She seems to be developing similar shoulder problems on the left.    I think she would benefit most from PT for her frozen shoulder as well as an orthopedic assessment of her bilateral shoulder pain.   I have referred her for outpatient PT for her frozen shoulder..  We are going to try to find an orthopedist for her to see as well, but any suggestions you have in this area would be helpful as she is on the [facility] "scholarship" program.  I will have the patient follow up with you.   Lupita Raider Modesto Charon, MD [facility], [place]

## 2012-07-29 ENCOUNTER — Encounter: Payer: Self-pay | Admitting: Internal Medicine

## 2012-07-29 ENCOUNTER — Ambulatory Visit (INDEPENDENT_AMBULATORY_CARE_PROVIDER_SITE_OTHER): Payer: Self-pay | Admitting: Internal Medicine

## 2012-07-29 VITALS — BP 120/90 | HR 72 | Temp 98.8°F | Resp 16 | Wt 222.0 lb

## 2012-07-29 DIAGNOSIS — M79601 Pain in right arm: Secondary | ICD-10-CM

## 2012-07-29 DIAGNOSIS — M25519 Pain in unspecified shoulder: Secondary | ICD-10-CM

## 2012-07-29 DIAGNOSIS — M25511 Pain in right shoulder: Secondary | ICD-10-CM

## 2012-07-29 DIAGNOSIS — M79609 Pain in unspecified limb: Secondary | ICD-10-CM

## 2012-07-29 DIAGNOSIS — F411 Generalized anxiety disorder: Secondary | ICD-10-CM

## 2012-07-29 NOTE — Assessment & Plan Note (Signed)
Options were discussed Declined injection Ortho ref was suggested

## 2012-07-29 NOTE — Patient Instructions (Signed)
[email or web address] 

## 2012-07-29 NOTE — Assessment & Plan Note (Signed)
MRI R shoulder 

## 2012-07-29 NOTE — Progress Notes (Signed)
   Subjective:    Patient ID: Paula [name], female    DOB: March 22, 1961, 51 y.o.   MRN: [id number]  HPI  C/o ongoing R trap, neck and shoulder pain going down the arm into the hand x 2-3 wks, severe at times at the level of 6-7/10 F/u  severe R shoulder pain - the shot has helped. Dr Modesto Charon said it was a frozen shoulder  Wt Readings from Last 3 Encounters:  07/29/12 222 lb (100.699 kg)  07/24/12 223 lb (101.152 kg)  05/25/12 219 lb (99.338 kg)   BP Readings from Last 3 Encounters:  07/29/12 120/90  07/24/12 126/84  05/25/12 128/72     Review of Systems  Constitutional: Positive for diaphoresis and fatigue. Negative for unexpected weight change.  HENT: Positive for neck stiffness. Negative for ear discharge.   Respiratory: Negative for chest tightness and shortness of breath.   Cardiovascular: Negative for leg swelling.  Genitourinary: Negative for dysuria, urgency, hematuria, flank pain, difficulty urinating and genital sores.  Musculoskeletal: Negative for joint swelling, arthralgias and gait problem.  Skin: Negative.  Negative for rash.  Neurological: Negative for tremors and speech difficulty.  Hematological: Negative for adenopathy. Does not bruise/bleed easily.  Psychiatric/Behavioral: Negative for suicidal ideas, behavioral problems, disturbed wake/sleep cycle and decreased concentration. The patient is not nervous/anxious.        Objective:   Physical Exam  Constitutional: She appears well-developed.       Obese   Neck: No JVD present. No thyromegaly present.  Pulmonary/Chest: She exhibits no tenderness.  Abdominal: There is no tenderness.  Musculoskeletal: She exhibits tenderness. She exhibits no edema.       R trap and R shoulder, arm is tender; hand is tender DTRs and MS OK Neck is tender with ROM  Lymphadenopathy:    She has no cervical adenopathy.  Neurological: Coordination normal.  R subacr - hurts too  Lab Results  Component Value Date   WBC 8.7  02/26/2012   HGB 13.1 02/26/2012   HCT 39.3 02/26/2012   PLT 371.0 02/26/2012   GLUCOSE 94 02/26/2012   CHOL 261* 01/08/2010   TRIG 210.0* 01/08/2010   HDL 51.40 01/08/2010   LDLDIRECT 170.1 01/08/2010   ALT 31 02/26/2012   AST 28 02/26/2012   NA 137 02/26/2012   K 4.0 02/26/2012   CL 102 02/26/2012   CREATININE 0.6 02/26/2012   BUN 11 02/26/2012   CO2 23 02/26/2012   TSH 1.13 02/26/2012   HGBA1C 5.2 01/08/2010        Assessment & Plan:

## 2012-07-29 NOTE — Assessment & Plan Note (Signed)
Continue with current prescription therapy as reflected on the Med list.  

## 2012-08-06 ENCOUNTER — Ambulatory Visit: Payer: Self-pay | Attending: Neurology | Admitting: Rehabilitative and Restorative Service Providers"

## 2012-08-06 DIAGNOSIS — R269 Unspecified abnormalities of gait and mobility: Secondary | ICD-10-CM | POA: Insufficient documentation

## 2012-08-06 DIAGNOSIS — IMO0001 Reserved for inherently not codable concepts without codable children: Secondary | ICD-10-CM | POA: Insufficient documentation

## 2012-08-06 DIAGNOSIS — R42 Dizziness and giddiness: Secondary | ICD-10-CM | POA: Insufficient documentation

## 2012-08-10 ENCOUNTER — Ambulatory Visit: Payer: Self-pay | Admitting: Physical Therapy

## 2012-08-11 ENCOUNTER — Ambulatory Visit: Payer: Self-pay | Admitting: Rehabilitation

## 2012-08-12 ENCOUNTER — Ambulatory Visit: Payer: Self-pay | Admitting: Rehabilitative and Restorative Service Providers"

## 2012-09-14 ENCOUNTER — Ambulatory Visit (INDEPENDENT_AMBULATORY_CARE_PROVIDER_SITE_OTHER): Payer: Self-pay

## 2012-09-14 DIAGNOSIS — R197 Diarrhea, unspecified: Secondary | ICD-10-CM

## 2012-09-28 ENCOUNTER — Ambulatory Visit: Payer: Self-pay

## 2012-09-29 ENCOUNTER — Ambulatory Visit: Payer: Self-pay | Admitting: Internal Medicine

## 2012-10-09 ENCOUNTER — Emergency Department (HOSPITAL_COMMUNITY): Payer: Self-pay

## 2012-10-09 ENCOUNTER — Ambulatory Visit (INDEPENDENT_AMBULATORY_CARE_PROVIDER_SITE_OTHER): Payer: Self-pay | Admitting: Internal Medicine

## 2012-10-09 ENCOUNTER — Encounter (HOSPITAL_COMMUNITY): Payer: Self-pay | Admitting: Emergency Medicine

## 2012-10-09 ENCOUNTER — Other Ambulatory Visit (INDEPENDENT_AMBULATORY_CARE_PROVIDER_SITE_OTHER): Payer: Self-pay

## 2012-10-09 ENCOUNTER — Telehealth: Payer: Self-pay | Admitting: Internal Medicine

## 2012-10-09 ENCOUNTER — Encounter: Payer: Self-pay | Admitting: Internal Medicine

## 2012-10-09 ENCOUNTER — Emergency Department (HOSPITAL_COMMUNITY)
Admission: EM | Admit: 2012-10-09 | Discharge: 2012-10-10 | Disposition: A | Discharge status: Home / Self Care | Payer: Self-pay | Attending: Emergency Medicine | Admitting: Emergency Medicine

## 2012-10-09 ENCOUNTER — Ambulatory Visit (INDEPENDENT_AMBULATORY_CARE_PROVIDER_SITE_OTHER)
Admission: RE | Admit: 2012-10-09 | Discharge: 2012-10-09 | Disposition: A | Discharge status: Home / Self Care | Payer: Self-pay | Source: Ambulatory Visit | Attending: Internal Medicine | Admitting: Internal Medicine

## 2012-10-09 VITALS — BP 130/88 | HR 80 | Temp 98.7°F | Resp 16 | Wt 224.0 lb

## 2012-10-09 DIAGNOSIS — Z87891 Personal history of nicotine dependence: Secondary | ICD-10-CM | POA: Insufficient documentation

## 2012-10-09 DIAGNOSIS — Z8719 Personal history of other diseases of the digestive system: Secondary | ICD-10-CM | POA: Insufficient documentation

## 2012-10-09 DIAGNOSIS — R140 Abdominal distension (gaseous): Secondary | ICD-10-CM

## 2012-10-09 DIAGNOSIS — G4730 Sleep apnea, unspecified: Secondary | ICD-10-CM | POA: Insufficient documentation

## 2012-10-09 DIAGNOSIS — Z79899 Other long term (current) drug therapy: Secondary | ICD-10-CM | POA: Insufficient documentation

## 2012-10-09 DIAGNOSIS — R141 Gas pain: Secondary | ICD-10-CM

## 2012-10-09 DIAGNOSIS — Z792 Long term (current) use of antibiotics: Secondary | ICD-10-CM | POA: Insufficient documentation

## 2012-10-09 DIAGNOSIS — R109 Unspecified abdominal pain: Secondary | ICD-10-CM | POA: Insufficient documentation

## 2012-10-09 DIAGNOSIS — K5730 Diverticulosis of large intestine without perforation or abscess without bleeding: Secondary | ICD-10-CM | POA: Insufficient documentation

## 2012-10-09 DIAGNOSIS — I10 Essential (primary) hypertension: Secondary | ICD-10-CM | POA: Insufficient documentation

## 2012-10-09 DIAGNOSIS — F329 Major depressive disorder, single episode, unspecified: Secondary | ICD-10-CM | POA: Insufficient documentation

## 2012-10-09 DIAGNOSIS — R6889 Other general symptoms and signs: Secondary | ICD-10-CM | POA: Insufficient documentation

## 2012-10-09 DIAGNOSIS — Z8679 Personal history of other diseases of the circulatory system: Secondary | ICD-10-CM | POA: Insufficient documentation

## 2012-10-09 DIAGNOSIS — R142 Eructation: Secondary | ICD-10-CM

## 2012-10-09 DIAGNOSIS — E785 Hyperlipidemia, unspecified: Secondary | ICD-10-CM | POA: Insufficient documentation

## 2012-10-09 DIAGNOSIS — N83209 Unspecified ovarian cyst, unspecified side: Secondary | ICD-10-CM

## 2012-10-09 DIAGNOSIS — F411 Generalized anxiety disorder: Secondary | ICD-10-CM | POA: Insufficient documentation

## 2012-10-09 DIAGNOSIS — R143 Flatulence: Secondary | ICD-10-CM

## 2012-10-09 DIAGNOSIS — F3289 Other specified depressive episodes: Secondary | ICD-10-CM | POA: Insufficient documentation

## 2012-10-09 DIAGNOSIS — M129 Arthropathy, unspecified: Secondary | ICD-10-CM | POA: Insufficient documentation

## 2012-10-09 DIAGNOSIS — Z872 Personal history of diseases of the skin and subcutaneous tissue: Secondary | ICD-10-CM | POA: Insufficient documentation

## 2012-10-09 DIAGNOSIS — Z862 Personal history of diseases of the blood and blood-forming organs and certain disorders involving the immune mechanism: Secondary | ICD-10-CM | POA: Insufficient documentation

## 2012-10-09 DIAGNOSIS — Z8639 Personal history of other endocrine, nutritional and metabolic disease: Secondary | ICD-10-CM | POA: Insufficient documentation

## 2012-10-09 LAB — COMPREHENSIVE METABOLIC PANEL
ALT: 39 U/L — ABNORMAL HIGH (ref 0–35)
AST: 30 U/L (ref 0–37)
Albumin: 4.3 g/dL (ref 3.5–5.2)
Alkaline Phosphatase: 67 U/L (ref 39–117)
BUN: 13 mg/dL (ref 6–23)
CO2: 24 mEq/L (ref 19–32)
Calcium: 9.7 mg/dL (ref 8.4–10.5)
Chloride: 100 mEq/L (ref 96–112)
Creatinine, Ser: 0.94 mg/dL (ref 0.50–1.10)
GFR calc Af Amer: 79 mL/min — ABNORMAL LOW (ref >90)
GFR calc non Af Amer: 69 mL/min — ABNORMAL LOW (ref >90)
Glucose, Bld: 96 mg/dL (ref 70–99)
Potassium: 4.3 mEq/L (ref 3.5–5.1)
Sodium: 135 mEq/L (ref 135–145)
Total Bilirubin: 0.5 mg/dL (ref 0.3–1.2)
Total Protein: 7.6 g/dL (ref 6.0–8.3)

## 2012-10-09 LAB — CBC WITH DIFFERENTIAL/PLATELET
Basophils Absolute: 0.1 10*3/uL (ref 0.0–0.1)
Basophils Absolute: 0 10*3/uL (ref 0.0–0.1)
Basophils Relative: 0.4 % (ref 0.0–3.0)
Basophils Relative: 0 % (ref 0–1)
Eosinophils Absolute: 0 10*3/uL (ref 0.0–0.7)
Eosinophils Absolute: 0 10*3/uL (ref 0.0–0.7)
Eosinophils Relative: 0 % (ref 0–5)
HCT: 36.8 % (ref 36.0–46.0)
Hemoglobin: 13 g/dL (ref 12.0–15.0)
Hemoglobin: 12.5 g/dL (ref 12.0–15.0)
Lymphocytes Relative: 16.8 % (ref 12.0–46.0)
Lymphocytes Relative: 28 % (ref 12–46)
Lymphs Abs: 5.5 10*3/uL — ABNORMAL HIGH (ref 0.7–4.0)
MCH: 29.4 pg (ref 26.0–34.0)
MCHC: 33.4 g/dL (ref 30.0–36.0)
MCHC: 34 g/dL (ref 30.0–36.0)
MCV: 88.6 fl (ref 78.0–100.0)
MCV: 86.6 fL (ref 78.0–100.0)
Monocytes Absolute: 0.9 10*3/uL (ref 0.1–1.0)
Monocytes Absolute: 1.1 10*3/uL — ABNORMAL HIGH (ref 0.1–1.0)
Monocytes Relative: 5 % (ref 3–12)
Neutro Abs: 16 10*3/uL — ABNORMAL HIGH (ref 1.4–7.7)
Neutro Abs: 13.1 10*3/uL — ABNORMAL HIGH (ref 1.7–7.7)
Neutrophils Relative %: 78.5 % — ABNORMAL HIGH (ref 43.0–77.0)
Neutrophils Relative %: 66 % (ref 43–77)
Platelets: 388 10*3/uL (ref 150–400)
RBC: 4.38 Mil/uL (ref 3.87–5.11)
RBC: 4.25 MIL/uL (ref 3.87–5.11)
RDW: 12.4 % (ref 11.5–14.6)
RDW: 12.3 % (ref 11.5–15.5)
WBC: 19.7 10*3/uL — ABNORMAL HIGH (ref 4.0–10.5)

## 2012-10-09 LAB — URINALYSIS, ROUTINE W REFLEX MICROSCOPIC
Bilirubin Urine: NEGATIVE
Glucose, UA: NEGATIVE mg/dL
Ketones, ur: NEGATIVE mg/dL
Leukocytes, UA: NEGATIVE
Nitrite: NEGATIVE
Protein, ur: NEGATIVE mg/dL
Specific Gravity, Urine: 1.004 — ABNORMAL LOW (ref 1.005–1.030)
Urobilinogen, UA: 0.2 mg/dL (ref 0.0–1.0)
pH: 6 (ref 5.0–8.0)

## 2012-10-09 LAB — URINALYSIS
Bilirubin Urine: NEGATIVE
Ketones, ur: NEGATIVE
Leukocytes, UA: NEGATIVE
Nitrite: NEGATIVE
pH: 6 (ref 5.0–8.0)

## 2012-10-09 LAB — LIPASE, BLOOD: Lipase: 31 U/L (ref 11–59)

## 2012-10-09 LAB — BASIC METABOLIC PANEL
BUN: 14 mg/dL (ref 6–23)
Creatinine, Ser: 1.1 mg/dL (ref 0.4–1.2)
GFR: 69.91 mL/min (ref >60.00)

## 2012-10-09 LAB — URINE MICROSCOPIC-ADD ON

## 2012-10-09 LAB — HEPATIC FUNCTION PANEL
Albumin: 4.5 g/dL (ref 3.5–5.2)
Total Bilirubin: 0.6 mg/dL (ref 0.3–1.2)

## 2012-10-09 LAB — LIPASE: Lipase: 29 U/L (ref 11.0–59.0)

## 2012-10-09 MED ORDER — MORPHINE SULFATE 4 MG/ML IJ SOLN
4.0000 mg | Freq: Once | INTRAMUSCULAR | Status: AC
  Administered 2012-10-09: 4 mg via INTRAVENOUS
  Filled 2012-10-09: qty 1

## 2012-10-09 MED ORDER — ALIGN 4 MG PO CAPS: 1.0000 | ORAL_CAPSULE | Freq: Every day | ORAL | Status: DC

## 2012-10-09 MED ORDER — CIPROFLOXACIN HCL 500 MG PO TABS: 500.0000 mg | ORAL_TABLET | Freq: Two times a day (BID) | ORAL | Status: DC

## 2012-10-09 MED ORDER — METHYLPREDNISOLONE SODIUM SUCC 125 MG IJ SOLR
125.0000 mg | Freq: Once | INTRAMUSCULAR | Status: AC
  Administered 2012-10-09: 125 mg via INTRAVENOUS
  Filled 2012-10-09: qty 2

## 2012-10-09 MED ORDER — ONDANSETRON HCL 4 MG/2ML IJ SOLN
4.0000 mg | INTRAMUSCULAR | Status: DC | PRN
  Administered 2012-10-09: 4 mg via INTRAVENOUS
  Filled 2012-10-09: qty 2

## 2012-10-09 MED ORDER — IOHEXOL 300 MG/ML  SOLN
100.0000 mL | Freq: Once | INTRAMUSCULAR | Status: AC | PRN
  Administered 2012-10-09: 100 mL via INTRAVENOUS

## 2012-10-09 MED ORDER — DIPHENHYDRAMINE HCL 50 MG/ML IJ SOLN
50.0000 mg | Freq: Once | INTRAMUSCULAR | Status: AC
  Administered 2012-10-09: 50 mg via INTRAVENOUS
  Filled 2012-10-09: qty 1

## 2012-10-09 NOTE — ED Notes (Signed)
Pt back from CT

## 2012-10-09 NOTE — Telephone Encounter (Signed)
Caller: Leylany/Patient; Patient Name: Isla Pence; PCP: Plotnikov, Alex (Adults only); Best Callback Phone Number: [phone].  Patient calling about abdominal swelling.  States she is in a research study for an investigational medication for IBS.  Onset of vomiting  09/26/12;  states she has nausea and has had bloating since that started 2 weeks ago.  Seen in UC 10/06/12 and given rx for prednisone x 6 days, and rx for Bactrim DS was given for "bronchitis which has gone into her stomach."  Per abdominal distention protocol, emergent symptoms denied; advised appt within 72 hours.  Appt scheduled by staff already 1600 10/09/12 with Dr. Posey Rea.

## 2012-10-09 NOTE — Assessment & Plan Note (Signed)
11/13 mostly L sided - poss diverticulitis Cipro 500 mg po bid Align Low residue diet Labs/abd Xray  Lab informed me of her  WBC=20K I called - left a VM on two phones to go to ER and get checked

## 2012-10-09 NOTE — ED Notes (Addendum)
PA at bedside.

## 2012-10-09 NOTE — ED Provider Notes (Signed)
History     CSN: [id number]  Arrival date & time 10/09/12  1811   First MD Initiated Contact with Patient 10/09/12 2024      Chief Complaint  Patient presents with  . Abdominal Pain  . Abnormal Lab    elevated wbc    (Consider location/radiation/quality/duration/timing/severity/associated sxs/prior treatment) HPI Comments: Patient with a history of diverticulosis, cholecystectomy in 2005, and hypertension presents emergency department with chief complaint of abdominal discomfort.  Onset of symptoms began approximately 2 weeks ago and is primarily located in the left upper and lower quadrants.  Patient states that she's felt her abdomen is distended and that she has intermittent pain currently rated at 3/10 with spikes up to 8/10.  She is currently being followed by primary care physician who has placed her on steroids and an antibiotic which she has been taken for 3 days.  Patient denies any fever, night sweats, chills, urinary symptoms, melena, hematochezia, emesis, chest pain or shortness of breath.  In addition patient states that her primary care physician advised her to come to the emergency department after having an elevated white count for further evaluation.  Patient has no other complaints at this time.  The history is provided by the patient.    Past Medical History  Diagnosis Date  . Allergy   . Anemia   . Anxiety   . Depression   . GERD (gastroesophageal reflux disease)   . Heart murmur   . Hyperlipidemia   . Hypertension   . Thyroid disease     nodule  . Ulcer   . Diverticulosis   . Sleep apnea     on C-pap machine  . Arthritis   . Vertigo     Past Surgical History  Procedure Date  . Cholecystectomy   . Cesarean section   . Cervical fusion   . Inguinal hernia repair   . Partial hysterectomy     Family History  Problem Relation Age of Onset  . Colon cancer Maternal Grandfather   . Hypertension Maternal Grandfather   . Hyperlipidemia Mother   .  Hypertension Mother   . Hypertension Maternal Grandmother   . Hypertension Brother   . Hypertension Sister     History  Substance Use Topics  . Smoking status: Former Smoker    Quit date: 07/24/2008  . Smokeless tobacco: Never Used  . Alcohol Use: Yes     Comment: rarely    OB History    Grav Para Term Preterm Abortions TAB SAB Ect Mult Living   3 2 2  1 1    2       Review of Systems  Constitutional: Negative for fever, chills and appetite change.  HENT: Negative for congestion.   Eyes: Negative for visual disturbance.  Respiratory: Negative for shortness of breath.   Cardiovascular: Negative for chest pain and leg swelling.  Gastrointestinal: Positive for nausea, abdominal pain and abdominal distention. Negative for vomiting, diarrhea, constipation, blood in stool, anal bleeding and rectal pain.  Genitourinary: Negative for dysuria, urgency and frequency.  Neurological: Negative for dizziness, syncope, weakness, light-headedness, numbness and headaches.  Psychiatric/Behavioral: Negative for confusion.  All other systems reviewed and are negative.    Allergies  Aspirin; Hydrocodone-acetaminophen; Moxifloxacin; and Oxycodone  Home Medications   Current Outpatient Rx  Name  Route  Sig  Dispense  Refill  . OMEPRAZOLE 20 MG PO CPDR   Oral   Take 20 mg by mouth at bedtime.          [name]  PREDNISONE 10 MG PO TABS   Oral   Take 10 mg by mouth as directed. 6,5,4,3,2,1 for 6 days         . SULFAMETHOXAZOLE-TMP DS 800-160 MG PO TABS   Oral   Take 1 tablet by mouth 2 (two) times daily.           BP 130/67  Pulse 56  Temp 98.3 F (36.8 C) (Oral)  Resp 18  SpO2 98%  Physical Exam  Constitutional: She is oriented to person, place, and time. She appears well-developed and well-nourished. No distress.  HENT:  Head: Normocephalic and atraumatic.  Mouth/Throat: Oropharynx is clear and moist. No oropharyngeal exudate.  Eyes: Conjunctivae normal and EOM are normal.  Pupils are equal, round, and reactive to light. No scleral icterus.  Neck: Normal range of motion. Neck supple. No tracheal deviation present. No thyromegaly present.  Cardiovascular: Normal rate, regular rhythm, normal heart sounds and intact distal pulses.   Pulmonary/Chest: Effort normal and breath sounds normal. No stridor. No respiratory distress. She has no wheezes.  Abdominal: Soft.       Soft full abdomen with mild tenderness to palpation in left upper and lower quadrants.  No rebound, masses or guarding on exam.  Bowel sounds present and normal  Musculoskeletal: Normal range of motion. She exhibits no edema and no tenderness.  Neurological: She is alert and oriented to person, place, and time. Coordination normal.  Skin: Skin is warm and dry. No rash noted. She is not diaphoretic. No erythema. No pallor.  Psychiatric: She has a normal mood and affect. Her behavior is normal.    ED Course  Procedures (including critical care time)  Labs Reviewed  CBC WITH DIFFERENTIAL - Abnormal; Notable for the following:    WBC 19.7 (*)     Neutro Abs 13.1 (*)     Lymphs Abs 5.5 (*)     Monocytes Absolute 1.1 (*)     All other components within normal limits  COMPREHENSIVE METABOLIC PANEL - Abnormal; Notable for the following:    ALT 39 (*)     GFR calc non Af Amer 69 (*)     GFR calc Af Amer 79 (*)     All other components within normal limits  URINALYSIS, ROUTINE W REFLEX MICROSCOPIC - Abnormal; Notable for the following:    Specific Gravity, Urine 1.004 (*)     Hgb urine dipstick SMALL (*)     All other components within normal limits  LIPASE, BLOOD  URINE MICROSCOPIC-ADD ON   Ct Abdomen Pelvis W Contrast  10/09/2012  *RADIOLOGY REPORT*  Clinical Data: Left lower and right upper quadrant abdominal pain, nausea  CT ABDOMEN AND PELVIS WITH CONTRAST  Technique:  Multidetector CT imaging of the abdomen and pelvis was performed following the standard protocol during bolus administration of  intravenous contrast.  Contrast: OMNIPAQUE IOHEXOL 300 MG/ML  SOLN  Comparison: None.  Findings:  Normal hepatic contour.  No discrete hepatic lesions, though note, the dome of the right lobe of the liver was not imaged.  Post cholecystectomy.  No definite intra or extrahepatic biliary ductal dilatation.  No ascites.  There is symmetric enhancement and excretion of the bilateral kidneys.  Note is made of a 5 mm nonobstructing stone within the mid aspect of the right kidney (axial image 29, series 2).  Sub centimeter hypoattenuating lesion within the superior pole left kidney (image 78, series four) is too small to accurately characterize but likely represents a renal  cyst.  No urinary obstruction.  No perinephric stranding.  Normal appearance of the bilateral adrenal glands, pancreas and spleen.  The bowel is normal in course and caliber without wall thickening or evidence of obstruction.  Small duodenal diverticulum is suspected.  Normal appearance of the appendix.  No pneumoperitoneum, pneumatosis or portal venous gas.  There is mild diastases of the midline of the rectus abdominal musculature. Postsurgical change deep to the umbilicus with a very small mesenteric fat containing periumbilical hernia.  Scattered atherosclerotic calcifications within a normal caliber abdominal aorta.  No retroperitoneal, mesenteric, pelvic or inguinal lymphadenopathy.  Note is made of an approximately 2.9 x 2.6 cm right-sided adnexal cyst as well as to left-sided adnexal cysts with dominant lesion measuring approximately 2.7 cm in diameter.  No free fluid in the pelvis.  Limited visualization of the lower thorax is negative for focal airspace opacity or pleural effusion.  Incidental note is made of a small bochdalek hernia.  Normal heart size.  No pericardial effusion.  No acute or aggressive osseous abnormalities.  IMPRESSION:  1.  No explanation for patient's right upper and left lower quadrant abdominal pain.  2.   Right-sided 5 mm nonobstructing renal stone. 3.  Bilateral adnexal cysts, presumably physiologic.   Original Report Authenticated By: Tacey Ruiz, MD    Dg Abd 2 Views  10/09/2012  *RADIOLOGY REPORT*  Clinical Data: Left abdominal pain, bloating  ABDOMEN - 2 VIEW  Comparison: 03/30/2011  Findings: There is nonspecific nonobstructive bowel gas pattern. Minimal lumbar levoscoliosis.  Post cholecystectomy surgical clips are noted. Stable pelvic phleboliths.  IMPRESSION: Nonspecific nonobstructive bowel gas pattern.  Minimal lumbar levoscoliosis.   Original Report Authenticated By: Natasha Mead, M.D.      No diagnosis found.    MDM  Leukocytosis and abdominal discomfort  51 year old female with a history of IBS & diverticulosis was sent to the emergency department by a PCP to rule out diverticulitis after 2 week onset of abdominal discomfort and leukocytosis.  Patient is currently being treated with Bactrim and prednisone taper. Leukocytosis was seen after 3 day coarse of being on prednisone. Pts IBS is currently being followed by Dr. Marina Goodell at [facility] who is also aware of current symptoms. Patient is nontoxic, nonseptic appearing, in no apparent distress.  Patient's pain and other symptoms adequately managed in emergency department.  Labs, imaging and vitals reviewed.  Patient does not meet the SIRS or Sepsis criteria.  On repeat exam patient does not have a surgical abdomin and there are nor peritoneal signs.  No indication of appendicitis, bowel obstruction, bowel perforation, cholecystitis, diverticulitis.  Patient discharged home with symptomatic treatment and given strict instructions for follow-up with their primary care physician.  I have also discussed reasons to return immediately to the ER.  Patient expresses understanding and agrees with plan.           Jaci Carrel, [place] 10/10/12 [id number]

## 2012-10-09 NOTE — Assessment & Plan Note (Addendum)
11/13 mostly L sided - poss diverticulitis Cipro 500 mg po bid Align Low residue diet Labs/abd Xray  Lab informed me of her  WBC=20K I called - left a VM on two phones to go to ER and get checked  5:54 pm I spoke w/Paula [name] - she was told to go to [facility] ER now - she is on her way.Marland KitchenMarland Kitchen

## 2012-10-09 NOTE — ED Notes (Addendum)
Pt states she has been having abd pain and swelling for past 2 weeks.  States she has had nausea but no v/d.  Pt states stools are normal for her, 3 a day soft.  Denies urinary symptoms.  Pt states pain is worse in upper quadrants.  Abd is soft, but swollen.  Pt states she has gained 2lb over past 2 days.  Went to PCP today and was referred here for critical result on elevated wbc.

## 2012-10-09 NOTE — Patient Instructions (Addendum)
Low residue diet x 10 days Go to ER if worse

## 2012-10-09 NOTE — Progress Notes (Signed)
Patient ID: Paula [name], female   DOB: Mar 05, 1961, 51 y.o.   MRN: [id number]   Subjective:    Patient ID: Paula [name], female    DOB: 12-Apr-1961, 51 y.o.   MRN: [id number]  Abdominal Pain This is a new problem. The current episode started 1 to 4 weeks ago (09/27/12 after BBQ fesival). The onset quality is gradual. The problem occurs constantly. The problem has been gradually worsening. The pain is located in the generalized abdominal region. The pain is at a severity of 5/10. The pain is moderate. The quality of the pain is dull. The abdominal pain does not radiate. Associated symptoms include anorexia and nausea. Pertinent negatives include no arthralgias, constipation, diarrhea, dysuria, hematuria, vomiting or weight loss. The pain is aggravated by eating. The pain is relieved by nothing. She has tried antibiotics and antacids (she was given Septra at UC last wk) for the symptoms. The treatment provided no relief. There is no history of colon cancer or Crohn's disease. diverticulosis      Wt Readings from Last 3 Encounters:  10/09/12 224 lb (101.606 kg)  07/29/12 222 lb (100.699 kg)  07/24/12 223 lb (101.152 kg)   BP Readings from Last 3 Encounters:  10/09/12 130/88  07/29/12 120/90  07/24/12 126/84     Review of Systems  Constitutional: Positive for fatigue. Negative for weight loss, diaphoresis and unexpected weight change.  HENT: Positive for neck stiffness. Negative for ear discharge.   Respiratory: Negative for chest tightness and shortness of breath.   Cardiovascular: Negative for leg swelling.  Gastrointestinal: Positive for nausea, abdominal pain, abdominal distention and anorexia. Negative for vomiting, diarrhea, constipation, blood in stool and rectal pain.  Genitourinary: Negative for dysuria, urgency, hematuria, flank pain, vaginal bleeding, difficulty urinating, genital sores and vaginal pain.  Musculoskeletal: Negative for joint swelling, arthralgias and gait  problem.  Skin: Negative.  Negative for rash.  Neurological: Negative for tremors and speech difficulty.  Hematological: Negative for adenopathy. Does not bruise/bleed easily.  Psychiatric/Behavioral: Negative for suicidal ideas, behavioral problems, sleep disturbance and decreased concentration. The patient is nervous/anxious.        Objective:   Physical Exam  Constitutional: She appears well-developed. No distress.       Obese   HENT:  Head: Normocephalic.  Right Ear: External ear normal.  Left Ear: External ear normal.  Nose: Nose normal.  Mouth/Throat: Oropharynx is clear and moist.  Eyes: Conjunctivae normal are normal. Pupils are equal, round, and reactive to light. Right eye exhibits no discharge. Left eye exhibits no discharge.  Neck: Normal range of motion. Neck supple. No JVD present. No tracheal deviation present. No thyromegaly present.  Cardiovascular: Normal rate, regular rhythm and normal heart sounds.   Pulmonary/Chest: No stridor. No respiratory distress. She has no wheezes. She exhibits no tenderness.  Abdominal: Soft. Bowel sounds are normal. She exhibits distension. She exhibits no mass. There is tenderness. There is no rebound and no guarding.       L side is tender, no rebound; no mass ?ascitis  Musculoskeletal: She exhibits tenderness. She exhibits no edema.        Neck is tender with ROM  Lymphadenopathy:    She has no cervical adenopathy.  Neurological: She displays normal reflexes. No cranial nerve deficit. She exhibits normal muscle tone. Coordination normal.  Skin: No rash noted. No erythema.  Psychiatric: She has a normal mood and affect. Her behavior is normal. Judgment and thought content normal.  R subacr - hurts  too  Lab Results  Component Value Date   WBC 8.7 02/26/2012   HGB 13.1 02/26/2012   HCT 39.3 02/26/2012   PLT 371.0 02/26/2012   GLUCOSE 94 02/26/2012   CHOL 261* 01/08/2010   TRIG 210.0* 01/08/2010   HDL 51.40 01/08/2010   LDLDIRECT 170.1  01/08/2010   ALT 31 02/26/2012   AST 28 02/26/2012   NA 137 02/26/2012   K 4.0 02/26/2012   CL 102 02/26/2012   CREATININE 0.6 02/26/2012   BUN 11 02/26/2012   CO2 23 02/26/2012   TSH 1.13 02/26/2012   HGBA1C 5.2 01/08/2010        Assessment & Plan:

## 2012-10-09 NOTE — Assessment & Plan Note (Signed)
6/13 LS MRI: IMPRESSION:  1. Minimal spondylosis. No evidence of spinal stenosis or nerve  root encroachment.  2. Partially imaged multi septated left adnexal cystic process.  Cystic ovarian neoplasm cannot be excluded, especially if the  patient is post menopausal. Further evaluation with pelvic  ultrasound recommended.  These results will be called to the ordering clinician or  representative by the Radiologist Assistant, and communication  documented in the PACS Dashboard.  Original Report Authenticated By: Gerrianne Scale, M.D.  06/03/12 [place]: IMPRESSION:  4.8 cm probable hemorrhagic left ovarian cyst, less likely an  endometrioma. Correlate with menopausal status, as a hemorrhagic  cyst of this size would be considered within normal limits in a  premenopausal/perimenopausal female. Follow-up ultrasound is  suggested in 6-12 weeks.  Right ovary is not discretely visualized.  Status post hysterectomy.  Original Report Authenticated By: Charline Bills, M.D.

## 2012-10-10 MED ORDER — DICYCLOMINE HCL 10 MG PO CAPS
10.0000 mg | ORAL_CAPSULE | Freq: Once | ORAL | Status: AC
  Administered 2012-10-10: 10 mg via ORAL
  Filled 2012-10-10: qty 1

## 2012-10-10 MED ORDER — HYDROCODONE-ACETAMINOPHEN 5-325 MG PO TABS: 1.0000 | ORAL_TABLET | Freq: Four times a day (QID) | ORAL | Status: DC | PRN

## 2012-10-10 MED ORDER — DICYCLOMINE HCL 20 MG PO TABS: 20.0000 mg | ORAL_TABLET | Freq: Two times a day (BID) | ORAL | Status: DC

## 2012-10-12 ENCOUNTER — Encounter: Payer: Self-pay | Admitting: Internal Medicine

## 2012-10-12 ENCOUNTER — Ambulatory Visit (INDEPENDENT_AMBULATORY_CARE_PROVIDER_SITE_OTHER): Payer: Self-pay | Admitting: Internal Medicine

## 2012-10-12 ENCOUNTER — Telehealth: Payer: Self-pay | Admitting: *Deleted

## 2012-10-12 VITALS — BP 110/78 | HR 80 | Temp 97.2°F | Resp 16 | Wt 220.0 lb

## 2012-10-12 DIAGNOSIS — D72829 Elevated white blood cell count, unspecified: Secondary | ICD-10-CM

## 2012-10-12 DIAGNOSIS — R109 Unspecified abdominal pain: Secondary | ICD-10-CM

## 2012-10-12 DIAGNOSIS — R143 Flatulence: Secondary | ICD-10-CM

## 2012-10-12 DIAGNOSIS — R140 Abdominal distension (gaseous): Secondary | ICD-10-CM

## 2012-10-12 DIAGNOSIS — R197 Diarrhea, unspecified: Secondary | ICD-10-CM

## 2012-10-12 DIAGNOSIS — R141 Gas pain: Secondary | ICD-10-CM

## 2012-10-12 NOTE — Telephone Encounter (Signed)
Patient calling, her abdominal pain has not improved.  Had the CT scan Friday as advised.  Was told to f/u with her GI specialist but she cannot an appt. until 11/22 at 215p.   She continues to gain weight, bloating and abd. swelling.   Has left side pain, just under her left breast.  "Feels like my organs or like liver and just hanging there, suspended".  Scheduled for 11a today.

## 2012-10-12 NOTE — Telephone Encounter (Signed)
Pt calling stating she cant get in to see Dr. Marina Goodell until next week. She states the ER ran tests and couldn't tell her anything. She wants to know what to do now? What meds should she be taking? She need OV with you again?   See ED note. Please advise.

## 2012-10-12 NOTE — Assessment & Plan Note (Signed)
CT w/o SBO, ascitis Will watch Start Cipro

## 2012-10-12 NOTE — Progress Notes (Signed)
Subjective:    Patient ID: Paula [name], female    DOB: [birth year], 51 y.o.   MRN: [id number]  Abdominal Pain This is a new problem. The current episode started 1 to 4 weeks ago (09/27/12 after eating at [facility]). The onset quality is gradual. The problem occurs constantly. The problem has been gradually worsening. The pain is located in the generalized abdominal region. The pain is at a severity of 5/10. The pain is moderate. The quality of the pain is dull. The abdominal pain does not radiate. Associated symptoms include anorexia and nausea. Pertinent negatives include no arthralgias, constipation, diarrhea, dysuria, hematuria, vomiting or weight loss. The pain is aggravated by eating. The pain is relieved by nothing. She has tried antibiotics and antacids (she was given Septra and Prednisone at UC last wk) for the symptoms. The treatment provided no relief. There is no history of colon cancer or Crohn's disease. diverticulosis  She has not started Cipro yet. CT was OK last Sat. C/o loose stools    Wt Readings from Last 3 Encounters:  10/12/12 220 lb (99.791 kg)  10/09/12 224 lb (101.606 kg)  07/29/12 222 lb (100.699 kg)   BP Readings from Last 3 Encounters:  10/12/12 110/78  10/09/12 130/67  10/09/12 130/88     Review of Systems  Constitutional: Positive for fatigue. Negative for weight loss, diaphoresis and unexpected weight change.  HENT: Positive for neck stiffness. Negative for ear discharge.   Respiratory: Negative for chest tightness and shortness of breath.   Cardiovascular: Negative for leg swelling.  Gastrointestinal: Positive for nausea, abdominal pain, abdominal distention and anorexia. Negative for vomiting, diarrhea, constipation, blood in stool and rectal pain.  Genitourinary: Negative for dysuria, urgency, hematuria, flank pain, vaginal bleeding, difficulty urinating, genital sores and vaginal pain.  Musculoskeletal: Negative for joint swelling, arthralgias and  gait problem.  Skin: Negative.  Negative for rash.  Neurological: Negative for tremors and speech difficulty.  Hematological: Negative for adenopathy. Does not bruise/bleed easily.  Psychiatric/Behavioral: Negative for suicidal ideas, behavioral problems, sleep disturbance and decreased concentration. The patient is nervous/anxious.        Objective:   Physical Exam  Constitutional: She appears well-developed. No distress.       Obese   HENT:  Head: Normocephalic.  Right Ear: External ear normal.  Left Ear: External ear normal.  Nose: Nose normal.  Mouth/Throat: Oropharynx is clear and moist.  Eyes: Conjunctivae normal are normal. Pupils are equal, round, and reactive to light. Right eye exhibits no discharge. Left eye exhibits no discharge.  Neck: Normal range of motion. Neck supple. No JVD present. No tracheal deviation present. No thyromegaly present.  Cardiovascular: Normal rate, regular rhythm and normal heart sounds.   Pulmonary/Chest: No stridor. No respiratory distress. She has no wheezes. She exhibits no tenderness.  Abdominal: Soft. Bowel sounds are normal. She exhibits distension. She exhibits no mass. There is tenderness. There is no rebound and no guarding.       L side is tender, no rebound; no mass ?ascitis  Musculoskeletal: She exhibits tenderness. She exhibits no edema.        Neck is tender with ROM  Lymphadenopathy:    She has no cervical adenopathy.  Neurological: She displays normal reflexes. No cranial nerve deficit. She exhibits normal muscle tone. Coordination normal.  Skin: No rash noted. No erythema.  Psychiatric: She has a normal mood and affect. Her behavior is normal. Judgment and thought content normal.  R subacr - hurts  too  Lab Results  Component Value Date   WBC 19.7* 10/09/2012   HGB 12.5 10/09/2012   HCT 36.8 10/09/2012   PLT 388 10/09/2012   GLUCOSE 96 10/09/2012   CHOL 261* 01/08/2010   TRIG 210.0* 01/08/2010   HDL 51.40 01/08/2010   LDLDIRECT  170.1 01/08/2010   ALT 39* 10/09/2012   AST 30 10/09/2012   NA 135 10/09/2012   K 4.3 10/09/2012   CL 100 10/09/2012   CREATININE 0.94 10/09/2012   BUN 13 10/09/2012   CO2 24 10/09/2012   TSH 1.13 02/26/2012   HGBA1C 5.2 01/08/2010        Assessment & Plan:

## 2012-10-12 NOTE — Assessment & Plan Note (Signed)
Will re-test later

## 2012-10-12 NOTE — Assessment & Plan Note (Signed)
Start Cipro Stool for C diff  CT abd: IMPRESSION:  1. No explanation for patient's right upper and left lower  quadrant abdominal pain.  2. Right-sided 5 mm nonobstructing renal stone.  3. Bilateral adnexal cysts, presumably physiologic.  Original Report Authenticated By: Tacey Ruiz, MD

## 2012-10-13 ENCOUNTER — Other Ambulatory Visit: Payer: Self-pay

## 2012-10-13 DIAGNOSIS — R197 Diarrhea, unspecified: Secondary | ICD-10-CM

## 2012-10-13 NOTE — ED Provider Notes (Signed)
Medical screening examination/treatment/procedure(s) were performed by non-physician practitioner and as supervising physician I was immediately available for consultation/collaboration.  Zaara Sprowl, MD 10/13/12 2319 

## 2012-10-23 ENCOUNTER — Encounter: Payer: Self-pay | Admitting: Internal Medicine

## 2012-10-23 ENCOUNTER — Ambulatory Visit (INDEPENDENT_AMBULATORY_CARE_PROVIDER_SITE_OTHER): Payer: Self-pay | Admitting: Internal Medicine

## 2012-10-23 VITALS — BP 122/80 | HR 55 | Ht 64.0 in | Wt 222.2 lb

## 2012-10-23 DIAGNOSIS — K219 Gastro-esophageal reflux disease without esophagitis: Secondary | ICD-10-CM

## 2012-10-23 DIAGNOSIS — K589 Irritable bowel syndrome without diarrhea: Secondary | ICD-10-CM

## 2012-10-23 DIAGNOSIS — R197 Diarrhea, unspecified: Secondary | ICD-10-CM

## 2012-10-23 DIAGNOSIS — R1084 Generalized abdominal pain: Secondary | ICD-10-CM

## 2012-10-23 MED ORDER — AMITRIPTYLINE HCL 25 MG PO TABS: 25.0000 mg | ORAL_TABLET | Freq: Every day | ORAL | Status: DC

## 2012-10-23 NOTE — Progress Notes (Signed)
HISTORY OF PRESENT ILLNESS:  Paula [name] is a 51 y.o. female with the below listed medical history who is followed in this office for functional abdominal complaints including irritable bowel syndrome. She presents today with ongoing abdominal complaints. Last seen April 2013 regarding the same. She is a history of GERD and obesity. Recently participated in and IBS drug study without benefit. Accompanied by her spouse. Current complaints include abdominal pain and diarrhea up to 6 times per day. Also difficulty sleeping. Last upper endoscopy May 2013 revealed hiatal hernia but was otherwise normal. Last colonoscopy 2012 normal except for diverticulosis. Recent blood work revealed unremarkable comprehensive metabolic panel. She did have a leukocytosis. CT scan of the abdomen and pelvis with contrast was performed 10/09/2012. This was normal. Nonobstructing kidney stone and adnexal cysts noted. Unable exercise regularly. Weight has been stable. No bleeding.  REVIEW OF SYSTEMS:  All non-GI ROS negative except for back pain, insomnia, anxiety/depression  Past Medical History  Diagnosis Date  . Allergy   . Anemia   . Anxiety   . Depression   . GERD (gastroesophageal reflux disease)   . Heart murmur   . Hyperlipidemia   . Hypertension   . Thyroid disease     nodule  . Ulcer   . Diverticulosis   . Sleep apnea     on C-pap machine  . Arthritis   . Vertigo   . Hernia   . IBS (irritable bowel syndrome)   . Obesity     Past Surgical History  Procedure Date  . Cholecystectomy   . Cesarean section   . Cervical fusion   . Inguinal hernia repair   . Partial hysterectomy     Social History [name]  reports that she quit smoking about 4 years ago. She has never used smokeless tobacco. She reports that she drinks alcohol. She reports that she does not use illicit drugs.  family history includes Colon cancer in her maternal grandfather; Hyperlipidemia in her mother; and  Hypertension in her brother, maternal grandfather, maternal grandmother, mother, and sister.  Allergies  Allergen Reactions  . Aspirin Other (See Comments)    ULCERS  . Hydrocodone-Acetaminophen Nausea And Vomiting  . Moxifloxacin Itching  . Oxycodone Nausea And Vomiting       PHYSICAL EXAMINATION:  Vital signs: BP 122/80  Pulse 55  Ht 5\' 4"  (1.626 m)  Wt 222 lb 3.2 oz (100.789 kg)  BMI 38.14 kg/m2  SpO2 98% General: Well-developed, well-nourished, no acute distress HEENT: Sclerae are anicteric, conjunctiva pink. Oral mucosa intact Lungs: Clear Heart: Regular Abdomen: soft, obese,nontender, nondistended, no obvious ascites, no peritoneal signs, normal bowel sounds. No organomegaly. Extremities: No edema Psychiatric: alert and oriented x3. Cooperative   ASSESSMENT:  #1. Chronic functional abdominal complaints including abdominal pain and loose stools. This on a background of anxiety and depression. Also insomnia. #2. GERD #3. Diverticulosis   PLAN:  #1. Prescribe amitriptyline 25 mg by mouth each bedtime. Discussed possible side effects. Titrate as needed, to effect. #2. Office followup in 6 weeks #3. Reflux precautions with attention to weight loss #4. Exercise #5. Continue Prilosec  #6. It should be noted that she was offered tertiary care referral

## 2012-10-23 NOTE — Patient Instructions (Addendum)
We have sent the following medications to your pharmacy for you to pick up at your convenience: Amitriptyline  Please follow up with Dr. Marina Goodell in 6 weeks

## 2012-10-26 ENCOUNTER — Encounter: Payer: Self-pay | Admitting: Internal Medicine

## 2012-10-26 ENCOUNTER — Ambulatory Visit (INDEPENDENT_AMBULATORY_CARE_PROVIDER_SITE_OTHER): Payer: Self-pay | Admitting: Internal Medicine

## 2012-10-26 VITALS — BP 132/88 | HR 72 | Temp 98.1°F | Resp 16 | Wt 222.0 lb

## 2012-10-26 DIAGNOSIS — K589 Irritable bowel syndrome without diarrhea: Secondary | ICD-10-CM

## 2012-10-26 DIAGNOSIS — R140 Abdominal distension (gaseous): Secondary | ICD-10-CM

## 2012-10-26 DIAGNOSIS — R109 Unspecified abdominal pain: Secondary | ICD-10-CM

## 2012-10-26 DIAGNOSIS — R143 Flatulence: Secondary | ICD-10-CM

## 2012-10-26 MED ORDER — FLUCONAZOLE 150 MG PO TABS: 150.0000 mg | ORAL_TABLET | Freq: Once | ORAL | Status: DC

## 2012-10-26 NOTE — Progress Notes (Signed)
Subjective:    Patient ID: Paula [name], female    DOB: March 08, 1961, 51 y.o.   MRN: [id number]  Abdominal Pain This is a new problem. The current episode started 1 to 4 weeks ago (09/27/12 after eating at [facility]). The onset quality is gradual. The problem occurs constantly. The problem has been gradually worsening. The pain is located in the generalized abdominal region. The pain is at a severity of 5/10. The pain is moderate. The quality of the pain is dull. The abdominal pain does not radiate. Associated symptoms include anorexia and nausea. Pertinent negatives include no arthralgias, constipation, diarrhea, dysuria, hematuria, vomiting or weight loss. The pain is aggravated by eating. The pain is relieved by nothing. She has tried antibiotics and antacids (she was given Septra and Prednisone at [facility] last wk) for the symptoms. The treatment provided no relief. There is no history of colon cancer or Crohn's disease. diverticulosis  She has not started Cipro yet. CT was OK last Sat. C/o loose stools    Wt Readings from Last 3 Encounters:  10/26/12 222 lb (100.699 kg)  10/23/12 222 lb 3.2 oz (100.789 kg)  10/12/12 220 lb (99.791 kg)   BP Readings from Last 3 Encounters:  10/26/12 132/88  10/23/12 122/80  10/12/12 110/78     Review of Systems  Constitutional: Positive for fatigue. Negative for weight loss, diaphoresis and unexpected weight change.  HENT: Positive for neck stiffness. Negative for ear discharge.   Respiratory: Negative for chest tightness and shortness of breath.   Cardiovascular: Negative for leg swelling.  Gastrointestinal: Positive for nausea, abdominal pain, abdominal distention and anorexia. Negative for vomiting, diarrhea, constipation, blood in stool and rectal pain.  Genitourinary: Negative for dysuria, urgency, hematuria, flank pain, vaginal bleeding, difficulty urinating, genital sores and vaginal pain.  Musculoskeletal: Negative for joint swelling,  arthralgias and gait problem.  Skin: Negative.  Negative for rash.  Neurological: Negative for tremors and speech difficulty.  Hematological: Negative for adenopathy. Does not bruise/bleed easily.  Psychiatric/Behavioral: Negative for suicidal ideas, behavioral problems, sleep disturbance and decreased concentration. The patient is nervous/anxious.        Objective:   Physical Exam  Constitutional: She appears well-developed. No distress.       Obese   HENT:  Head: Normocephalic.  Right Ear: External ear normal.  Left Ear: External ear normal.  Nose: Nose normal.  Mouth/Throat: Oropharynx is clear and moist.  Eyes: Conjunctivae normal are normal. Pupils are equal, round, and reactive to light. Right eye exhibits no discharge. Left eye exhibits no discharge.  Neck: Normal range of motion. Neck supple. No JVD present. No tracheal deviation present. No thyromegaly present.  Cardiovascular: Normal rate, regular rhythm and normal heart sounds.   Pulmonary/Chest: No stridor. No respiratory distress. She has no wheezes. She exhibits no tenderness.  Abdominal: Soft. Bowel sounds are normal. She exhibits distension. She exhibits no mass. There is tenderness. There is no rebound and no guarding.       L side is tender, no rebound; no mass ?ascitis  Musculoskeletal: She exhibits tenderness. She exhibits no edema.        Neck is tender with ROM  Lymphadenopathy:    She has no cervical adenopathy.  Neurological: She displays normal reflexes. No cranial nerve deficit. She exhibits normal muscle tone. Coordination normal.  Skin: No rash noted. No erythema.  Psychiatric: She has a normal mood and affect. Her behavior is normal. Judgment and thought content normal.  R subacr -  hurts too  Lab Results  Component Value Date   WBC 19.7* 10/09/2012   HGB 12.5 10/09/2012   HCT 36.8 10/09/2012   PLT 388 10/09/2012   GLUCOSE 96 10/09/2012   CHOL 261* 01/08/2010   TRIG 210.0* 01/08/2010   HDL 51.40  01/08/2010   LDLDIRECT 170.1 01/08/2010   ALT 39* 10/09/2012   AST 30 10/09/2012   NA 135 10/09/2012   K 4.3 10/09/2012   CL 100 10/09/2012   CREATININE 0.94 10/09/2012   BUN 13 10/09/2012   CO2 24 10/09/2012   TSH 1.13 02/26/2012   HGBA1C 5.2 01/08/2010        Assessment & Plan:

## 2012-10-28 NOTE — Assessment & Plan Note (Signed)
Resolved

## 2012-10-28 NOTE — Assessment & Plan Note (Signed)
Continue with current prescription therapy as reflected on the Med list.  

## 2012-10-28 NOTE — Assessment & Plan Note (Signed)
Better  

## 2012-11-19 ENCOUNTER — Ambulatory Visit: Payer: Self-pay

## 2012-11-23 ENCOUNTER — Ambulatory Visit: Payer: Self-pay

## 2012-11-26 ENCOUNTER — Ambulatory Visit (INDEPENDENT_AMBULATORY_CARE_PROVIDER_SITE_OTHER): Payer: Self-pay

## 2012-11-26 DIAGNOSIS — R197 Diarrhea, unspecified: Secondary | ICD-10-CM

## 2012-12-07 ENCOUNTER — Ambulatory Visit: Payer: Self-pay | Admitting: Internal Medicine

## 2012-12-24 ENCOUNTER — Telehealth: Payer: Self-pay | Admitting: Internal Medicine

## 2012-12-24 NOTE — Telephone Encounter (Signed)
The patient called and is hoping to get something called in for a sore throat.  She states she does not have insurance at this time, and can not afford to come in for an appointment.

## 2012-12-24 NOTE — Telephone Encounter (Signed)
It is likely viral. Use over-the-counter  "cold" medicines  such as Halls, Ricola, "Afrin" nasal spray for nasal congestion as directed instead. Use" Delsym" or" Robitussin" cough syrup varietis for cough.  You can use plain "Tylenol" or "Advil" for fever, chills and achyness.   "Common cold" symptoms are usually triggered by a virus.  The antibiotics are usually not necessary. On average, a" viral cold" illness would take 4-7 days to resolve. Please, make an appointment if you are not better or if you're worse.

## 2012-12-25 ENCOUNTER — Ambulatory Visit (INDEPENDENT_AMBULATORY_CARE_PROVIDER_SITE_OTHER): Payer: Self-pay | Admitting: Internal Medicine

## 2012-12-25 ENCOUNTER — Encounter: Payer: Self-pay | Admitting: Internal Medicine

## 2012-12-25 VITALS — BP 118/72 | HR 79 | Temp 100.1°F | Resp 12 | Wt 222.0 lb

## 2012-12-25 DIAGNOSIS — J069 Acute upper respiratory infection, unspecified: Secondary | ICD-10-CM | POA: Insufficient documentation

## 2012-12-25 MED ORDER — TRAMADOL HCL 50 MG PO TABS: 50.0000 mg | ORAL_TABLET | Freq: Two times a day (BID) | ORAL | Status: DC | PRN

## 2012-12-25 MED ORDER — AMOXICILLIN 500 MG PO CAPS: 1000.0000 mg | ORAL_CAPSULE | Freq: Two times a day (BID) | ORAL | Status: DC

## 2012-12-25 MED ORDER — FLUCONAZOLE 150 MG PO TABS: 150.0000 mg | ORAL_TABLET | Freq: Once | ORAL | Status: DC

## 2012-12-25 NOTE — Progress Notes (Signed)
Subjective:    Patient ID: Paula [name], female    DOB: Aug 27, 1961, 52 y.o.   MRN: [id number]  Cough This is a new problem. The current episode started in the past 7 days. The problem has been rapidly worsening. The problem occurs constantly. The cough is productive of purulent sputum. Associated symptoms include chest pain, chills, a fever, myalgias and rhinorrhea. Pertinent negatives include no rash, shortness of breath or weight loss. diverticulosis  Abdominal Pain This is a new problem. The current episode started more than 1 month ago (09/27/12 after BBQ fesival). The onset quality is gradual. The problem occurs constantly. The problem has been resolved. The pain is located in the generalized abdominal region. The pain is at a severity of 5/10. The pain is moderate. The quality of the pain is dull. The abdominal pain does not radiate. Associated symptoms include anorexia, a fever, myalgias and nausea. Pertinent negatives include no arthralgias, constipation, diarrhea, dysuria, hematuria, vomiting or weight loss. The pain is aggravated by eating. The pain is relieved by nothing. She has tried antibiotics and antacids (she was given Septra at [facility] last wk) for the symptoms. The treatment provided no relief. There is no history of colon cancer or Crohn's disease. diverticulosis      Wt Readings from Last 3 Encounters:  12/25/12 222 lb 0.6 oz (100.717 kg)  10/26/12 222 lb (100.699 kg)  10/23/12 222 lb 3.2 oz (100.789 kg)   BP Readings from Last 3 Encounters:  12/25/12 118/72  10/26/12 132/88  10/23/12 122/80     Review of Systems  Constitutional: Positive for fever, chills and fatigue. Negative for weight loss, diaphoresis and unexpected weight change.  HENT: Positive for congestion, rhinorrhea and neck stiffness. Negative for sneezing and ear discharge.   Respiratory: Positive for cough. Negative for chest tightness and shortness of breath.   Cardiovascular: Positive for chest pain.  Negative for leg swelling.  Gastrointestinal: Positive for nausea, abdominal pain, abdominal distention and anorexia. Negative for vomiting, diarrhea, constipation, blood in stool and rectal pain.  Genitourinary: Negative for dysuria, urgency, hematuria, flank pain, vaginal bleeding, difficulty urinating, genital sores and vaginal pain.  Musculoskeletal: Positive for myalgias. Negative for joint swelling, arthralgias and gait problem.  Skin: Negative.  Negative for rash.  Neurological: Negative for tremors and speech difficulty.  Hematological: Negative for adenopathy. Does not bruise/bleed easily.  Psychiatric/Behavioral: Negative for suicidal ideas, behavioral problems, sleep disturbance and decreased concentration. The patient is nervous/anxious.        Objective:   Physical Exam  Constitutional: She appears well-developed. No distress.       Obese   HENT:  Head: Normocephalic.  Right Ear: External ear normal.  Left Ear: External ear normal.  Nose: Nose normal.  Mouth/Throat: Oropharynx is clear and moist.       hoarse  Eyes: Conjunctivae normal are normal. Pupils are equal, round, and reactive to light. Right eye exhibits no discharge. Left eye exhibits no discharge.  Neck: Normal range of motion. Neck supple. No JVD present. No tracheal deviation present. No thyromegaly present.  Cardiovascular: Normal rate, regular rhythm and normal heart sounds.   Pulmonary/Chest: No stridor. No respiratory distress. She has no wheezes. She exhibits no tenderness.  Abdominal: Soft. Bowel sounds are normal. She exhibits distension. She exhibits no mass. There is tenderness. There is no rebound and no guarding.       L side is tender, no rebound; no mass ?ascitis  Musculoskeletal: She exhibits tenderness. She exhibits no  edema.        Neck is tender with ROM  Lymphadenopathy:    She has no cervical adenopathy.  Neurological: She displays normal reflexes. No cranial nerve deficit. She exhibits  normal muscle tone. Coordination normal.  Skin: No rash noted. No erythema.  Psychiatric: She has a normal mood and affect. Her behavior is normal. Judgment and thought content normal.  R subacr - hurts too  Lab Results  Component Value Date   WBC 19.7* 10/09/2012   HGB 12.5 10/09/2012   HCT 36.8 10/09/2012   PLT 388 10/09/2012   GLUCOSE 96 10/09/2012   CHOL 261* 01/08/2010   TRIG 210.0* 01/08/2010   HDL 51.40 01/08/2010   LDLDIRECT 170.1 01/08/2010   ALT 39* 10/09/2012   AST 30 10/09/2012   NA 135 10/09/2012   K 4.3 10/09/2012   CL 100 10/09/2012   CREATININE 0.94 10/09/2012   BUN 13 10/09/2012   CO2 24 10/09/2012   TSH 1.13 02/26/2012   HGBA1C 5.2 01/08/2010        Assessment & Plan:

## 2012-12-25 NOTE — Telephone Encounter (Signed)
Left mess for patient to call back.  

## 2012-12-25 NOTE — Assessment & Plan Note (Signed)
Strep test Depomedrol 80 mg im Amox Tramadol

## 2013-01-07 ENCOUNTER — Ambulatory Visit: Payer: Self-pay | Admitting: Internal Medicine

## 2013-01-25 ENCOUNTER — Ambulatory Visit (INDEPENDENT_AMBULATORY_CARE_PROVIDER_SITE_OTHER): Payer: Self-pay | Admitting: Internal Medicine

## 2013-01-25 ENCOUNTER — Encounter: Payer: Self-pay | Admitting: Internal Medicine

## 2013-01-25 VITALS — BP 120/80 | HR 80 | Temp 97.8°F | Resp 16 | Wt 219.0 lb

## 2013-01-25 DIAGNOSIS — I10 Essential (primary) hypertension: Secondary | ICD-10-CM

## 2013-01-25 DIAGNOSIS — K219 Gastro-esophageal reflux disease without esophagitis: Secondary | ICD-10-CM

## 2013-01-25 DIAGNOSIS — K5289 Other specified noninfective gastroenteritis and colitis: Secondary | ICD-10-CM

## 2013-01-25 DIAGNOSIS — R141 Gas pain: Secondary | ICD-10-CM

## 2013-01-25 MED ORDER — OMEPRAZOLE MAGNESIUM 20 MG PO TBEC: 20.0000 mg | DELAYED_RELEASE_TABLET | Freq: Every day | ORAL | Status: DC

## 2013-01-25 NOTE — Assessment & Plan Note (Signed)
Resolved

## 2013-01-25 NOTE — Assessment & Plan Note (Signed)
Change to Prilosec tabs (caps were making her constipated)

## 2013-01-25 NOTE — Assessment & Plan Note (Signed)
Better  

## 2013-01-25 NOTE — Assessment & Plan Note (Signed)
resolved 

## 2013-01-25 NOTE — Assessment & Plan Note (Signed)
NAS diet Off RX

## 2013-01-25 NOTE — Progress Notes (Signed)
Subjective:    Cough The problem has been resolved. The problem occurs constantly. The cough is productive of purulent sputum. Pertinent negatives include no chest pain, chills, fever, myalgias, rash, rhinorrhea, shortness of breath or weight loss. diverticulosis  Abdominal Pain Episode onset: 09/27/12 after BBQ fesival. The onset quality is gradual. The problem occurs constantly. The problem has been resolved. The pain is located in the generalized abdominal region. The pain is at a severity of 5/10. The pain is moderate. The quality of the pain is dull. The abdominal pain does not radiate. Associated symptoms include anorexia and nausea. Pertinent negatives include no arthralgias, constipation, diarrhea, dysuria, fever, hematuria, myalgias, vomiting or weight loss. The pain is aggravated by eating. The pain is relieved by nothing. She has tried antibiotics and antacids (she was given Septra at [facility] last wk) for the symptoms. The treatment provided no relief. There is no history of colon cancer or Crohn's disease. diverticulosis      Wt Readings from Last 3 Encounters:  01/25/13 219 lb (99.338 kg)  12/25/12 222 lb 0.6 oz (100.717 kg)  10/26/12 222 lb (100.699 kg)   BP Readings from Last 3 Encounters:  01/25/13 120/80  12/25/12 118/72  10/26/12 132/88     Review of Systems  Constitutional: Positive for fatigue. Negative for fever, chills, weight loss, diaphoresis and unexpected weight change.  HENT: Positive for congestion and neck stiffness. Negative for rhinorrhea, sneezing and ear discharge.        Hoarse  Respiratory: Negative for cough, chest tightness and shortness of breath.   Cardiovascular: Negative for chest pain and leg swelling.  Gastrointestinal: Positive for nausea and anorexia. Negative for vomiting, abdominal pain, diarrhea, constipation, blood in stool, abdominal distention and rectal pain.  Genitourinary: Negative for dysuria, urgency, hematuria, flank pain, vaginal  bleeding, difficulty urinating, genital sores and vaginal pain.  Musculoskeletal: Negative for myalgias, joint swelling, arthralgias and gait problem.  Skin: Negative.  Negative for rash.  Neurological: Negative for tremors and speech difficulty.  Hematological: Negative for adenopathy. Does not bruise/bleed easily.  Psychiatric/Behavioral: Negative for suicidal ideas, behavioral problems, sleep disturbance and decreased concentration. The patient is not nervous/anxious.        Objective:   Physical Exam  Constitutional: She appears well-developed. No distress.  Obese   HENT:  Head: Normocephalic.  Right Ear: External ear normal.  Left Ear: External ear normal.  Nose: Nose normal.  Mouth/Throat: Oropharynx is clear and moist.  hoarse  Eyes: Conjunctivae are normal. Pupils are equal, round, and reactive to light. Right eye exhibits no discharge. Left eye exhibits no discharge.  Neck: Normal range of motion. Neck supple. No JVD present. No tracheal deviation present. No thyromegaly present.  Cardiovascular: Normal rate, regular rhythm and normal heart sounds.   Pulmonary/Chest: No stridor. No respiratory distress. She has no wheezes. She exhibits no tenderness.  Abdominal: Soft. Bowel sounds are normal. She exhibits distension. She exhibits no mass. There is tenderness. There is no rebound and no guarding.  L side is tender, no rebound; no mass ?ascitis  Musculoskeletal: She exhibits tenderness. She exhibits no edema.   Neck is tender with ROM  Lymphadenopathy:    She has no cervical adenopathy.  Neurological: She displays normal reflexes. No cranial nerve deficit. She exhibits normal muscle tone. Coordination normal.  Skin: No rash noted. No erythema.  Psychiatric: She has a normal mood and affect. Her behavior is normal. Judgment and thought content normal.  R subacr - hurts too  Lab Results  Component Value Date   WBC 19.7* 10/09/2012   HGB 12.5 10/09/2012   HCT 36.8  10/09/2012   PLT 388 10/09/2012   GLUCOSE 96 10/09/2012   CHOL 261* 01/08/2010   TRIG 210.0* 01/08/2010   HDL 51.40 01/08/2010   LDLDIRECT 170.1 01/08/2010   ALT 39* 10/09/2012   AST 30 10/09/2012   NA 135 10/09/2012   K 4.3 10/09/2012   CL 100 10/09/2012   CREATININE 0.94 10/09/2012   BUN 13 10/09/2012   CO2 24 10/09/2012   TSH 1.13 02/26/2012   HGBA1C 5.2 01/08/2010        Assessment & Plan:

## 2013-01-27 ENCOUNTER — Encounter (HOSPITAL_COMMUNITY): Payer: Self-pay | Admitting: *Deleted

## 2013-01-27 ENCOUNTER — Encounter: Payer: Self-pay | Admitting: Internal Medicine

## 2013-01-28 ENCOUNTER — Encounter (HOSPITAL_COMMUNITY): Payer: Self-pay | Admitting: *Deleted

## 2013-01-29 ENCOUNTER — Other Ambulatory Visit (INDEPENDENT_AMBULATORY_CARE_PROVIDER_SITE_OTHER): Payer: Self-pay

## 2013-01-29 ENCOUNTER — Telehealth: Payer: Self-pay | Admitting: Internal Medicine

## 2013-01-29 LAB — URINALYSIS, ROUTINE W REFLEX MICROSCOPIC
Leukocytes, UA: NEGATIVE
Nitrite: NEGATIVE
Specific Gravity, Urine: >=1.03 (ref 1.000–1.030)
Total Protein, Urine: NEGATIVE
pH: 6 (ref 5.0–8.0)

## 2013-01-29 NOTE — Telephone Encounter (Signed)
Needs a UA

## 2013-02-02 ENCOUNTER — Telehealth: Payer: Self-pay | Admitting: *Deleted

## 2013-02-02 NOTE — Telephone Encounter (Signed)
AZO (OTC) prn UA here Thx

## 2013-02-02 NOTE — Telephone Encounter (Signed)
I informed pt of UA results. She still c/o urinary urgency and frequency. She is having trouble holding her urine and is having to run to the restroom. What should she do now?

## 2013-02-08 NOTE — Telephone Encounter (Signed)
Pt informed

## 2013-02-09 ENCOUNTER — Other Ambulatory Visit: Payer: Self-pay | Admitting: Obstetrics and Gynecology

## 2013-02-09 ENCOUNTER — Ambulatory Visit (HOSPITAL_COMMUNITY)
Admission: RE | Admit: 2013-02-09 | Discharge: 2013-02-09 | Disposition: A | Discharge status: Home / Self Care | Payer: Self-pay | Source: Ambulatory Visit | Attending: Obstetrics and Gynecology | Admitting: Obstetrics and Gynecology

## 2013-02-09 ENCOUNTER — Encounter (HOSPITAL_COMMUNITY): Payer: Self-pay

## 2013-02-09 VITALS — BP 120/82 | Temp 98.2°F | Ht 64.0 in | Wt 222.2 lb

## 2013-02-09 DIAGNOSIS — N644 Mastodynia: Secondary | ICD-10-CM | POA: Insufficient documentation

## 2013-02-09 NOTE — Patient Instructions (Signed)
Taught patient how to perform BSE. Patient did not need a Pap smear today due to last Pap smear was 02/18/2012. Let patient know that since she has a history of a hysterectomy for benign reasons that she does not need any further Pap smears. Patient stated she has a history of 2 previous abnormal Pap smears and cryotherapy. Patient stated the abnormal Pap smears were in the late 1980's and [id number]. Let patient know that since it has been greater than 20 years since last abnormal Pap smears she would not need any further Pap smears. Patient was concerned about that so told patient will follow up with [facility] Director and call her back. Patient also complained of menopausal symptoms and urge incontinence. Offered to refer her to the [facility] for follow up. Told patient that BCCCP would not cover that follow up appointment. Let her know that there is a $20.00 co-pay and would need to fill out financial assistance paperwork. The clinics were closed for lunch when attempted to call. Let patient know that will schedule her a follow up appointment and either Sabrina or myself would call her back with the appointment. Referred patient to the [facility] for bilateral diagnostic mammogram and possible ultrasound. Appointment scheduled for Thursday, February 18, 2013 at 1300. Patient aware of appointment and will be there. Patient verbalized understanding.

## 2013-02-09 NOTE — Progress Notes (Signed)
Patient complained of left and right outer breast pain that comes and goes. Patient rates pain at a 4 out of 10.  Pap Smear:    Pap smear not completed today. Last Pap smear was 02/18/2012 at [facility] and normal. Per patient has a history of 2 abnormal Pap smears in the late 1980's and early 1990's that required cryotherapy for follow up. Patient has a history of a hysterectomy in the 1990's for DUB. Last Pap smear result in [facility].  Physical exam: Breasts Breasts symmetrical. No skin abnormalities bilateral breasts. No nipple retraction bilateral breasts. No nipple discharge bilateral breasts. No lymphadenopathy. No lumps palpated bilateral breasts. Complaints of left outer breast pain, left inner lower breast pain and right outer breast pain on exam. Referred patient to the [facility] for bilateral diagnostic mammogram and possible ultrasound. Appointment scheduled for Thursday, February 18, 2013 at 1300.   Pelvic/Bimanual No Pap smear completed today since last Pap smear was 02/25/12. Pap smear not indicated per BCCCP guidelines.

## 2013-02-11 ENCOUNTER — Telehealth (HOSPITAL_COMMUNITY): Payer: Self-pay | Admitting: *Deleted

## 2013-02-11 NOTE — Telephone Encounter (Signed)
Called and advised patient of appointment with [facility] Wed April 2 2:45. Patient voiced understanding.

## 2013-02-18 ENCOUNTER — Ambulatory Visit
Admission: RE | Admit: 2013-02-18 | Discharge: 2013-02-18 | Disposition: A | Discharge status: Home / Self Care | Payer: No Typology Code available for payment source | Source: Ambulatory Visit | Attending: Obstetrics and Gynecology | Admitting: Obstetrics and Gynecology

## 2013-03-03 ENCOUNTER — Encounter: Payer: Self-pay | Admitting: Obstetrics and Gynecology

## 2013-03-03 ENCOUNTER — Ambulatory Visit (INDEPENDENT_AMBULATORY_CARE_PROVIDER_SITE_OTHER): Payer: Self-pay | Admitting: Obstetrics and Gynecology

## 2013-03-03 ENCOUNTER — Encounter: Payer: Self-pay | Admitting: Internal Medicine

## 2013-03-03 VITALS — BP 172/125 | HR 72 | Temp 98.6°F | Ht 64.0 in | Wt 217.9 lb

## 2013-03-03 DIAGNOSIS — R32 Unspecified urinary incontinence: Secondary | ICD-10-CM

## 2013-03-03 NOTE — Addendum Note (Signed)
Addended by: Franchot Mimes on: 03/03/2013 05:08 PM   Modules accepted: Orders

## 2013-03-03 NOTE — Progress Notes (Signed)
Patient c/o urinary incontinence over the last two years.  She has difficulty "holding it" and she often loses urine during coughing, laughing, ect.  She has a high blood pressure reading today of 176/125.  Patient c/o blurred vision that has been coming and going.  She has had a stomach bug for the last couple of days and has been feeling weak and tired.

## 2013-03-03 NOTE — Progress Notes (Signed)
Patient ID: Paula [name], female   DOB: 1961-05-10, 52 y.o.   MRN: [id number] 52 yo G3P2012 presenting today for evaluation of urinary incontinence. Patient reports a 2-3 year history of incontinence which has gotten worst over time. Patient describes symptoms of stress incontinence where she experiences leakage of urine with sneezing, coughing and laughing. Patient also describes urge incontinence where she has difficulty holding her urine. She suffers for nocturia. She is very embarrassed by all these symptoms. She has been married for the past 2 years and does not want her husband to know of these issues. She has had a hysterectomy in the 1990's secondary to menorrhagia and dysplasia. She has had 2 children via c-section.  Past Medical History  Diagnosis Date  . Allergy   . Anemia   . Anxiety   . Depression   . GERD (gastroesophageal reflux disease)   . Heart murmur   . Hyperlipidemia   . Hypertension   . Thyroid disease     nodule  . Ulcer   . Diverticulosis   . Sleep apnea     on C-pap machine  . Arthritis   . Vertigo   . Hernia   . IBS (irritable bowel syndrome)   . Obesity    Past Surgical History  Procedure Laterality Date  . Cholecystectomy    . Cesarean section    . Cervical fusion    . Inguinal hernia repair    . Partial hysterectomy     Family History  Problem Relation Age of Onset  . Colon cancer Maternal Grandfather   . Hypertension Maternal Grandfather   . Cancer Maternal Grandfather     lung  . Hyperlipidemia Mother   . Hypertension Mother   . Hypertension Maternal Grandmother   . Hypertension Brother   . Hypertension Sister   . Cancer Maternal Aunt     lung   History  Substance Use Topics  . Smoking status: Former Smoker    Quit date: 07/24/2008  . Smokeless tobacco: Never Used  . Alcohol Use: Yes     Comment: rarely   GENERAL: Well-developed, well-nourished female in no acute distress.  ABDOMEN: Soft, nontender, nondistended, obese. PELVIC:  Normal external female genitalia. Vagina is pink and rugated. Vaginal vault intact. No evidence of prolapse. Good muscle tone. No adnexal mass or tenderness. EXTREMITIES: No cyanosis, clubbing, or edema, 2+ distal pulses.  A/P 52 yo with mixed urinary incontinence - Will refer to Dr. Marice Potter next available for further evaluation and treatment -Urine culture sent - patient with elevated BP today- Advised to go to ED or urgent care for acute management - Patient advised to follow-up with PCP for further management of HTN, particularly if surgical intervention is in her future

## 2013-03-05 ENCOUNTER — Telehealth: Payer: Self-pay | Admitting: *Deleted

## 2013-03-05 LAB — URINE CULTURE

## 2013-03-05 NOTE — Telephone Encounter (Signed)
I just seen this email. I called pt to see how she was feeling. She states she is feeling better today and her blood pressure is   137/92 today. She denies SOB, CP, neck pain, dizziness at this time. She states she had a blood pressure med that she was given to have on hand if she ever needed it and it is outdated. Does she need a new Rx for something? I do not see any HTN meds. Please advise.

## 2013-03-06 NOTE — Telephone Encounter (Signed)
Monitor BP. RTC w/readings in 1-2 wks. Low salt diet Thx

## 2013-03-08 NOTE — Telephone Encounter (Signed)
Pt informed of below.  

## 2013-03-10 ENCOUNTER — Telehealth: Payer: Self-pay

## 2013-03-10 NOTE — Telephone Encounter (Signed)
Pt called requesting results of UA ordered by her OBGYN 03/03/2013. Pt advised that she will need to contact office of ordering physician for results/treatment. Pt was not satisfied with this explanation, stating that since results are viewable in MyChart AVP should advise her regarding UA. I again explained that this test was ordered by her OBGYN at her visit and she will have to contact that office regarding testing associated with that visit. Pt agreed.

## 2013-03-22 ENCOUNTER — Encounter: Payer: Self-pay | Admitting: General Practice

## 2013-03-22 ENCOUNTER — Telehealth: Payer: Self-pay | Admitting: General Practice

## 2013-03-22 NOTE — Telephone Encounter (Signed)
1338- Pt called and stated that she was returning our call.  Called pt and informed pt that the urine culture showed insignificant growth which means no signs of bacteria growth.  Pt stated understanding and did not have any other questions.

## 2013-03-22 NOTE — Telephone Encounter (Signed)
Called patient, no answer- left message that we are returning her phone call and to give [place] a call back

## 2013-03-22 NOTE — Telephone Encounter (Signed)
Patient called and left message stating she would like the results of her urinalysis from the other day and wanted to get the results explained to her, she got some information from the mychart & would like a call back.

## 2013-03-26 ENCOUNTER — Ambulatory Visit: Payer: Self-pay | Admitting: Obstetrics & Gynecology

## 2013-04-14 ENCOUNTER — Other Ambulatory Visit: Payer: Self-pay | Admitting: Internal Medicine

## 2013-04-14 NOTE — Telephone Encounter (Signed)
Please advise on refill-not on current or past history of med list.

## 2013-04-16 ENCOUNTER — Telehealth: Payer: Self-pay | Admitting: Internal Medicine

## 2013-04-16 NOTE — Telephone Encounter (Signed)
Patient called back, refused appt for today with Dr. Jonny Ruiz, patient states she will just go to the ER

## 2013-04-16 NOTE — Telephone Encounter (Signed)
Patient Information:  Caller Name: Lyssa  Phone: [phone]  Patient: Paula [name], Paula [name]  Gender: Female  DOB: 1961/09/08  Age: 52 Years  PCP: Plotnikov, Alex (Adults only)  Pregnant: No  Office Follow Up:  Does the office need to follow up with this patient?: Yes  Instructions For The Office: Please review -- if able to make Urgent Appt patient will be glad to come to office -- please contact her at  [phone].   She would also plan to go to ER if can not be worked into office -- if she does not hear back from office soon, will go to ER.   Symptoms  Reason For Call & Symptoms: Rattling dry cough, sore throat, hoarseness with headache, vertigo started yesterday 5/15.  Hurts to cough.  Has to hold onto husband and walls when moving due to veritigo.  Difficulty talking due to hoarseness, cough, can hear squeek with cough.  Headache 6/10 today, eyes sensitive to light.  Fever 99.2.  Has had 2 loose stools this am 5/16  Reviewed Health History In EMR: Yes  Reviewed Medications In EMR: Yes  Reviewed Allergies In EMR: Yes  Reviewed Surgeries / Procedures: Yes  Date of Onset of Symptoms: 04/15/2013  Any Fever: Yes  Fever Taken: Oral  Fever Time Of Reading: 09:30:00  Fever Last Reading: 99.2 OB / GYN:  LMP: Unknown  Guideline(s) Used:  Dizziness  Disposition Per Guideline:   Go to ED Now (or to Office with PCP Approval)  Reason For Disposition Reached:   Severe dizziness (e.g., unable to stand, requires support to walk, feels like passing out now)  Advice Given:  Some Causes of Temporary Dizziness:  Standing Up Suddenly - Standing up suddenly (especially getting out of bed) or prolonged standing in one place are common causes of temporary dizziness. Not drinking enough fluids always makes it worse. Certain medications can cause or increase this type of dizziness (e.g., blood pressure medications).  Drink Fluids:  Drink several glasses of fruit juice, other clear fluids, or water.  This will improve hydration and blood glucose. If you have a fever or have had heat exposure, make sure the fluids are cold.  Call Back If:  Passes out (faints)  You become worse.  Patient Will Follow Care Advice:  YES

## 2013-04-16 NOTE — Telephone Encounter (Signed)
Left message for Paula [name] to call back to schedule, there is a cancellation with Dr. Jonny Ruiz at 3:00 today if Paula [name] would like to take that appt

## 2013-04-17 ENCOUNTER — Emergency Department (HOSPITAL_COMMUNITY)
Admission: EM | Admit: 2013-04-17 | Discharge: 2013-04-17 | Disposition: A | Discharge status: Home / Self Care | Payer: Self-pay | Attending: Emergency Medicine | Admitting: Emergency Medicine

## 2013-04-17 ENCOUNTER — Emergency Department (HOSPITAL_COMMUNITY): Payer: Self-pay

## 2013-04-17 ENCOUNTER — Encounter (HOSPITAL_COMMUNITY): Payer: Self-pay

## 2013-04-17 DIAGNOSIS — Z8719 Personal history of other diseases of the digestive system: Secondary | ICD-10-CM | POA: Insufficient documentation

## 2013-04-17 DIAGNOSIS — Z8739 Personal history of other diseases of the musculoskeletal system and connective tissue: Secondary | ICD-10-CM | POA: Insufficient documentation

## 2013-04-17 DIAGNOSIS — J029 Acute pharyngitis, unspecified: Secondary | ICD-10-CM | POA: Insufficient documentation

## 2013-04-17 DIAGNOSIS — J040 Acute laryngitis: Secondary | ICD-10-CM

## 2013-04-17 DIAGNOSIS — F411 Generalized anxiety disorder: Secondary | ICD-10-CM | POA: Insufficient documentation

## 2013-04-17 DIAGNOSIS — R011 Cardiac murmur, unspecified: Secondary | ICD-10-CM | POA: Insufficient documentation

## 2013-04-17 DIAGNOSIS — J40 Bronchitis, not specified as acute or chronic: Secondary | ICD-10-CM

## 2013-04-17 DIAGNOSIS — K219 Gastro-esophageal reflux disease without esophagitis: Secondary | ICD-10-CM | POA: Insufficient documentation

## 2013-04-17 DIAGNOSIS — R5381 Other malaise: Secondary | ICD-10-CM | POA: Insufficient documentation

## 2013-04-17 DIAGNOSIS — Z862 Personal history of diseases of the blood and blood-forming organs and certain disorders involving the immune mechanism: Secondary | ICD-10-CM | POA: Insufficient documentation

## 2013-04-17 DIAGNOSIS — H9209 Otalgia, unspecified ear: Secondary | ICD-10-CM | POA: Insufficient documentation

## 2013-04-17 DIAGNOSIS — R079 Chest pain, unspecified: Secondary | ICD-10-CM | POA: Insufficient documentation

## 2013-04-17 DIAGNOSIS — F3289 Other specified depressive episodes: Secondary | ICD-10-CM | POA: Insufficient documentation

## 2013-04-17 DIAGNOSIS — G4730 Sleep apnea, unspecified: Secondary | ICD-10-CM | POA: Insufficient documentation

## 2013-04-17 DIAGNOSIS — Z9981 Dependence on supplemental oxygen: Secondary | ICD-10-CM | POA: Insufficient documentation

## 2013-04-17 DIAGNOSIS — Z79899 Other long term (current) drug therapy: Secondary | ICD-10-CM | POA: Insufficient documentation

## 2013-04-17 DIAGNOSIS — E669 Obesity, unspecified: Secondary | ICD-10-CM | POA: Insufficient documentation

## 2013-04-17 DIAGNOSIS — Z872 Personal history of diseases of the skin and subcutaneous tissue: Secondary | ICD-10-CM | POA: Insufficient documentation

## 2013-04-17 DIAGNOSIS — Z8639 Personal history of other endocrine, nutritional and metabolic disease: Secondary | ICD-10-CM | POA: Insufficient documentation

## 2013-04-17 DIAGNOSIS — J3489 Other specified disorders of nose and nasal sinuses: Secondary | ICD-10-CM | POA: Insufficient documentation

## 2013-04-17 DIAGNOSIS — Z87891 Personal history of nicotine dependence: Secondary | ICD-10-CM | POA: Insufficient documentation

## 2013-04-17 DIAGNOSIS — J209 Acute bronchitis, unspecified: Secondary | ICD-10-CM | POA: Insufficient documentation

## 2013-04-17 DIAGNOSIS — I10 Essential (primary) hypertension: Secondary | ICD-10-CM | POA: Insufficient documentation

## 2013-04-17 DIAGNOSIS — F329 Major depressive disorder, single episode, unspecified: Secondary | ICD-10-CM | POA: Insufficient documentation

## 2013-04-17 LAB — CBC WITH DIFFERENTIAL/PLATELET
Basophils Absolute: 0.1 K/uL (ref 0.0–0.1)
Basophils Relative: 1 % (ref 0–1)
Eosinophils Absolute: 0.3 K/uL (ref 0.0–0.7)
Eosinophils Relative: 3 % (ref 0–5)
HCT: 40.5 % (ref 36.0–46.0)
Hemoglobin: 13.5 g/dL (ref 12.0–15.0)
Lymphocytes Relative: 32 % (ref 12–46)
Lymphs Abs: 2.8 K/uL (ref 0.7–4.0)
MCH: 29 pg (ref 26.0–34.0)
MCHC: 33.3 g/dL (ref 30.0–36.0)
MCV: 86.9 fL (ref 78.0–100.0)
Monocytes Absolute: 1.1 10*3/uL — ABNORMAL HIGH (ref 0.1–1.0)
Monocytes Relative: 12 % (ref 3–12)
Neutro Abs: 4.6 10*3/uL (ref 1.7–7.7)
Neutrophils Relative %: 52 % (ref 43–77)
Platelets: 323 K/uL (ref 150–400)
RBC: 4.66 MIL/uL (ref 3.87–5.11)
RDW: 12.2 % (ref 11.5–15.5)
WBC: 8.8 K/uL (ref 4.0–10.5)

## 2013-04-17 LAB — BASIC METABOLIC PANEL
BUN: 13 mg/dL (ref 6–23)
Calcium: 10.3 mg/dL (ref 8.4–10.5)
Chloride: 99 mEq/L (ref 96–112)
Creatinine, Ser: 0.73 mg/dL (ref 0.50–1.10)
GFR calc Af Amer: >90 mL/min (ref >90)

## 2013-04-17 LAB — BASIC METABOLIC PANEL WITH GFR
CO2: 22 meq/L (ref 19–32)
GFR calc non Af Amer: >90 mL/min (ref >90)
Glucose, Bld: 88 mg/dL (ref 70–99)
Potassium: 3.9 meq/L (ref 3.5–5.1)
Sodium: 136 meq/L (ref 135–145)

## 2013-04-17 MED ORDER — PREDNISONE 20 MG PO TABS
40.0000 mg | ORAL_TABLET | Freq: Once | ORAL | Status: AC
  Administered 2013-04-17: 40 mg via ORAL
  Filled 2013-04-17: qty 2

## 2013-04-17 MED ORDER — ALBUTEROL SULFATE (5 MG/ML) 0.5% IN NEBU
5.0000 mg | INHALATION_SOLUTION | Freq: Once | RESPIRATORY_TRACT | Status: AC
  Administered 2013-04-17: 5 mg via RESPIRATORY_TRACT
  Filled 2013-04-17: qty 1

## 2013-04-17 MED ORDER — PREDNISONE 20 MG PO TABS: 40.0000 mg | ORAL_TABLET | Freq: Every day | ORAL | Status: DC

## 2013-04-17 MED ORDER — HYDROCOD POLST-CHLORPHEN POLST 10-8 MG/5ML PO LQCR: 5.0000 mL | Freq: Two times a day (BID) | ORAL | Status: DC | PRN

## 2013-04-17 MED ORDER — BENZONATATE 100 MG PO CAPS: 100.0000 mg | ORAL_CAPSULE | Freq: Three times a day (TID) | ORAL | Status: DC | PRN

## 2013-04-17 MED ORDER — ALBUTEROL SULFATE HFA 108 (90 BASE) MCG/ACT IN AERS
1.0000 | INHALATION_SPRAY | Freq: Four times a day (QID) | RESPIRATORY_TRACT | Status: DC | PRN
  Filled 2013-04-17: qty 6.7

## 2013-04-17 MED ORDER — HYDROCOD POLST-CHLORPHEN POLST 10-8 MG/5ML PO LQCR
5.0000 mL | Freq: Once | ORAL | Status: AC
  Administered 2013-04-17: 5 mL via ORAL
  Filled 2013-04-17: qty 5

## 2013-04-17 NOTE — Discharge Instructions (Signed)
Bronchitis  Bronchitis is the body's way of reacting to injury and/or infection (inflammation) of the bronchi. Bronchi are the air tubes that extend from the windpipe into the lungs. If the inflammation becomes severe, it may cause shortness of breath.  CAUSES   Inflammation may be caused by:   A virus.   Germs (bacteria).   Dust.   Allergens.   Pollutants and many other irritants.  The cells lining the bronchial tree are covered with tiny hairs (cilia). These constantly beat upward, away from the lungs, toward the mouth. This keeps the lungs free of pollutants. When these cells become too irritated and are unable to do their job, mucus begins to develop. This causes the characteristic cough of bronchitis. The cough clears the lungs when the cilia are unable to do their job. Without either of these protective mechanisms, the mucus would settle in the lungs. Then you would develop pneumonia.  Smoking is a common cause of bronchitis and can contribute to pneumonia. Stopping this habit is the single most important thing you can do to help yourself.  TREATMENT    Your caregiver may prescribe an antibiotic if the cough is caused by bacteria. Also, medicines that open up your airways make it easier to breathe. Your caregiver may also recommend or prescribe an expectorant. It will loosen the mucus to be coughed up. Only take over-the-counter or prescription medicines for pain, discomfort, or fever as directed by your caregiver.   Removing whatever causes the problem (smoking, for example) is critical to preventing the problem from getting worse.   Cough suppressants may be prescribed for relief of cough symptoms.   Inhaled medicines may be prescribed to help with symptoms now and to help prevent problems from returning.   For those with recurrent (chronic) bronchitis, there may be a need for steroid medicines.  SEEK IMMEDIATE MEDICAL CARE IF:    During treatment, you develop more pus-like mucus (purulent  sputum).   You have a fever.   You become progressively more ill.   You have increased difficulty breathing, wheezing, or shortness of breath.  It is necessary to seek immediate medical care if you are elderly or sick from any other disease.  MAKE SURE YOU:    Understand these instructions.   Will watch your condition.   Will get help right away if you are not doing well or get worse.  Document Released: 11/18/2005 Document Revised: 02/10/2012 Document Reviewed: 09/27/2008  [facility] Patient Information 2013 [place], [place].

## 2013-04-17 NOTE — ED Notes (Signed)
She c/o persistent cough since this Wed.  She also c/o right ear "itching", also vertigo and sore throat.  She is grimacing as if in much discomfort. She tells me she has "just coughed up a little blood".  She c/o pain at left lat. Thoracic area worse with deep breath/cough.

## 2013-04-17 NOTE — ED Notes (Signed)
Teaching done with incentive spirometer and MDI. Education also provided re respiratory rate and deep breathing exercises and reducing hyperventilation. Pt and family verbalized understanding of instructions.

## 2013-04-17 NOTE — ED Provider Notes (Signed)
History     CSN: [id number]  Arrival date & time 04/17/13  1704   First MD Initiated Contact with Patient 04/17/13 1906      Chief Complaint  Patient presents with  . Cough    (Consider location/radiation/quality/duration/timing/severity/associated sxs/prior treatment) HPI Comments: The patient reports she's had paroxysmal mostly dry cough since 3 days ago. It has since become somewhat productive of white and yellow sputum. She endorses chills, unsure of actual fevers at home. She notes that the nurse here found her temperature to be 99.8, suspects that that is a fever for her. She denies smoking history, history of pneumonia or bronchitis in the past. However I later find out that she has taken Tussionex in the past. She's been taking Mucinex at home with no significant relief. She reports some soreness to her bilateral cage she attributes to all the smoking. She reports that she's not been able to sleep the last 2 nights due to coughing. She denies any obvious sick contacts. She reports some generalized fatigue, no abdominal pain, nausea or vomiting. She reports that she's had a little bit of nasal congestion, sore throat, discomfort and itching mostly to her right ear canal. She reports she does not usually use Q-tips. She denies any drainage from her ear. She denies any change in her hearing. she reports a few days ago, when symptoms began she reported that she had a lot of dizziness with sensation of movement and reports a history of vertigo. The symptoms seem to be improved.  Patient is a 52 y.o. female presenting with cough. The history is provided by the patient and a relative.  Cough Associated symptoms: chest pain, chills, ear pain and sore throat   Associated symptoms: no fever and no rash     Past Medical History  Diagnosis Date  . Allergy   . Anemia   . Anxiety   . Depression   . GERD (gastroesophageal reflux disease)   . Heart murmur   . Hyperlipidemia   . Hypertension    . Thyroid disease     nodule  . Ulcer   . Diverticulosis   . Sleep apnea     on C-pap machine  . Arthritis   . Vertigo   . Hernia   . IBS (irritable bowel syndrome)   . Obesity     Past Surgical History  Procedure Laterality Date  . Cholecystectomy    . Cesarean section    . Cervical fusion    . Inguinal hernia repair    . Partial hysterectomy      Family History  Problem Relation Age of Onset  . Colon cancer Maternal Grandfather   . Hypertension Maternal Grandfather   . Cancer Maternal Grandfather     lung  . Hyperlipidemia Mother   . Hypertension Mother   . Hypertension Maternal Grandmother   . Hypertension Brother   . Hypertension Sister   . Cancer Maternal Aunt     lung    History  Substance Use Topics  . Smoking status: Former Smoker    Quit date: 07/24/2008  . Smokeless tobacco: Never Used  . Alcohol Use: Yes     Comment: rarely    OB History   Grav Para Term Preterm Abortions TAB SAB Ect Mult Living   3 2 2  1 1    2       Review of Systems  Constitutional: Positive for chills and fatigue. Negative for fever.  HENT: Positive for ear pain,  congestion, sore throat and voice change. Negative for hearing loss, neck stiffness and ear discharge.   Respiratory: Positive for cough. Negative for chest tightness.   Cardiovascular: Positive for chest pain.  Gastrointestinal: Negative for nausea, vomiting, abdominal pain and diarrhea.  Skin: Negative for rash.  All other systems reviewed and are negative.    Allergies  Aspirin; Hydrocodone-acetaminophen; Moxifloxacin; and Oxycodone  Home Medications   Current Outpatient Rx  Name  Route  Sig  Dispense  Refill  . amitriptyline (ELAVIL) 25 MG tablet   Oral   Take 1 tablet (25 mg total) by mouth at bedtime.   30 tablet   2   . dicyclomine (BENTYL) 20 MG tablet   Oral   Take 1 tablet (20 mg total) by mouth 2 (two) times daily.   20 tablet   0   . hydrOXYzine (VISTARIL) 25 MG capsule      TAKE  ONE-HALF TO TWO CAPSULES BY MOUTH 4 TIMES DAILY AS NEEDED FOR VERTIGO   120 capsule   0   . omeprazole (PRILOSEC OTC) 20 MG tablet   Oral   Take 1 tablet (20 mg total) by mouth daily.   30 tablet   11   . Phenylephrine-DM-GG (MUCINEX FAST-MAX CONGEST COUGH PO)   Oral   Take 10 mLs by mouth every 6 (six) hours.         . traMADol (ULTRAM) 50 MG tablet   Oral   Take 1-2 tablets (50-100 mg total) by mouth 2 (two) times daily as needed for pain.   60 tablet   0   . benzonatate (TESSALON PERLES) 100 MG capsule   Oral   Take 1 capsule (100 mg total) by mouth 3 (three) times daily as needed for cough.   20 capsule   0   . chlorpheniramine-HYDROcodone (TUSSIONEX PENNKINETIC ER) 10-8 MG/5ML LQCR   Oral   Take 5 mLs by mouth every 12 (twelve) hours as needed.   80 mL   0   . predniSONE (DELTASONE) 20 MG tablet   Oral   Take 2 tablets (40 mg total) by mouth daily.   12 tablet   0     BP 148/97  Pulse 80  Temp(Src) 99.8 F (37.7 C) (Oral)  Resp 24  SpO2 100%  Physical Exam  Nursing note and vitals reviewed. Constitutional: She appears well-developed and well-nourished. No distress.  HENT:  Head: Normocephalic and atraumatic.  Right Ear: Tympanic membrane and external ear normal. No mastoid tenderness.  Left Ear: Tympanic membrane and external ear normal. No mastoid tenderness.  Mouth/Throat: Uvula is midline, oropharynx is clear and moist and mucous membranes are normal. Mucous membranes are not pale and not dry. No oropharyngeal exudate or posterior oropharyngeal edema.  Voice is very hoarse, no stridor, slight erythema to ear canal on right  Eyes: Conjunctivae and EOM are normal. No scleral icterus.  Pulmonary/Chest: She has wheezes. She has no rales. She exhibits tenderness.  Abdominal: Soft. She exhibits no distension. There is no tenderness. There is no rebound and no guarding.  Skin: Skin is warm and dry. No rash noted. She is not diaphoretic.    ED Course   Procedures (including critical care time)  Labs Reviewed  CBC WITH DIFFERENTIAL - Abnormal; Notable for the following:    Monocytes Absolute 1.1 (*)    All other components within normal limits  BASIC METABOLIC PANEL   Dg Chest 2 View  04/17/2013   *RADIOLOGY REPORT*  Clinical  Data: Chest pain.  CHEST - 2 VIEW  Comparison: 07/30/2010.  Findings: Cardiomegaly.  Calcified tortuous aorta.  Mild prominence of the central markings suggesting chronic bronchitic change without focal infiltrates or edema.  No effusion or pneumothorax. Prior cervical surgery.  No osseous findings.  IMPRESSION: Cardiomegaly.  Mild bronchitic change.  No active infiltrates. Similar appearance to priors.   Original Report Authenticated By: Davonna Belling, M.D.     1. Bronchitis   2. Laryngitis     ra sat is 100% and I interpret to be normal  MDM  Pt with laryngitis, paroxysmal coughing withotu hypoxia, only minimal wheze. Will need bronchilators, prednisone and cough meds.  No abx needed, no fever, no elevated WBC.  Can follow up with PCP this week if not improving.          Gavin Pound. Oletta Lamas, MD 04/17/13 2026

## 2013-04-19 ENCOUNTER — Encounter: Payer: Self-pay | Admitting: Obstetrics & Gynecology

## 2013-04-19 ENCOUNTER — Ambulatory Visit (INDEPENDENT_AMBULATORY_CARE_PROVIDER_SITE_OTHER): Payer: Self-pay | Admitting: Obstetrics & Gynecology

## 2013-04-19 VITALS — BP 154/84 | Temp 98.2°F | Ht 64.0 in | Wt 219.4 lb

## 2013-04-19 DIAGNOSIS — R32 Unspecified urinary incontinence: Secondary | ICD-10-CM

## 2013-04-19 NOTE — Progress Notes (Signed)
  Subjective:    Patient ID: Paula [name], female    DOB: 11-Jun-1961, 52 y.o.   MRN: [id number]  HPI  52 yo M AA P2 with mixed incontinence for several years. She also has nocturia and has to get up from bed more than 3 times per night.  Review of Systems She is very rarely sexually active.    Objective:   Physical Exam  No prolapse Minimal movement with a Q tip test. She requested a pessary. I fitted her with a #4 ring with incontinence ball. She was able to remove and reinsert it. It relieved her GSUI.      Assessment & Plan:  Mixed incontinence- I will go ahead and schedule her an appt with urology so that if the pessary doesn't work she will have this already scheduled. RTC 4 weeks. She understands the importance of removing the pessary every night.

## 2013-04-19 NOTE — Patient Instructions (Signed)
Urinary Incontinence Your doctor wants you to have this information about urinary incontinence. This is the inability to keep urine in your body until you decide to release it. CAUSES  Prostate gland enlargement is a common cause of urinary incontinence. But there are many different causes for losing urinary control. They include:  Medicines.  Infections.  Prostate problems.  Surgery.  Neurological diseases.  Emotional factors. DIAGNOSIS  Evaluating the cause of incontinence is important in choosing the best treatment. This may require:  An ultrasound exam.  Kidney and bladder X-rays.  Cystoscopy. This is an exam of the bladder using a narrow scope. TREATMENT  For incontinent patients, normal daily hygiene and using changing pads or adult diapers regularly will prevent offensive odors and skin damage from the moisture. Changing your medicines may help control incontinence. Your caregiver may prescribe some medicines to help you regain control. Avoid caffeine. It can over-stimulate the bladder. Use the bathroom regularly. Try about every 2 to 3 hours even if you do not feel the need. Take time to empty your bladder completely. After urinating, wait a minute. Then try to urinate again. External devices used to catch urine or an indwelling urine catheter (Foley catheter) may be needed as well. Some prostate gland problems require surgery to correct. Call your caregiver for more information. Document Released: 12/26/2004 Document Revised: 02/10/2012 Document Reviewed: 12/21/2008 [employer] Patient Information 2013 [employer].  

## 2013-04-27 ENCOUNTER — Other Ambulatory Visit: Payer: Self-pay | Admitting: Internal Medicine

## 2013-04-27 ENCOUNTER — Ambulatory Visit (HOSPITAL_COMMUNITY)
Admission: RE | Admit: 2013-04-27 | Discharge: 2013-04-27 | Disposition: A | Discharge status: Home / Self Care | Payer: Self-pay | Source: Ambulatory Visit | Attending: Internal Medicine | Admitting: Internal Medicine

## 2013-04-27 ENCOUNTER — Ambulatory Visit: Payer: Self-pay | Attending: Family Medicine | Admitting: Internal Medicine

## 2013-04-27 VITALS — BP 149/103 | HR 79 | Temp 99.3°F | Resp 15 | Wt 217.6 lb

## 2013-04-27 DIAGNOSIS — Z9089 Acquired absence of other organs: Secondary | ICD-10-CM | POA: Insufficient documentation

## 2013-04-27 DIAGNOSIS — J209 Acute bronchitis, unspecified: Secondary | ICD-10-CM | POA: Insufficient documentation

## 2013-04-27 DIAGNOSIS — R42 Dizziness and giddiness: Secondary | ICD-10-CM | POA: Insufficient documentation

## 2013-04-27 DIAGNOSIS — J40 Bronchitis, not specified as acute or chronic: Secondary | ICD-10-CM | POA: Insufficient documentation

## 2013-04-27 DIAGNOSIS — J440 Chronic obstructive pulmonary disease with acute lower respiratory infection: Secondary | ICD-10-CM

## 2013-04-27 MED ORDER — SALINE NASAL SPRAY 0.65 % NA SOLN: 1.0000 | NASAL | Status: DC | PRN

## 2013-04-27 MED ORDER — AZITHROMYCIN 500 MG PO TABS: 500.0000 mg | ORAL_TABLET | Freq: Every day | ORAL | Status: DC

## 2013-04-27 MED ORDER — FLUTICASONE PROPIONATE 50 MCG/ACT NA SUSP: 2.0000 | Freq: Every day | NASAL | Status: DC

## 2013-04-27 NOTE — Progress Notes (Signed)
Subjective:    Patient ID: Paula [name], female    DOB: 1961/05/16, 52 y.o.   MRN: [id number]  HPI  52 yr old presents with a  dry cough since 2 weeks ago. It has since become somewhat productive of white and yellow sputum. She endorses chills, unsure of actual fevers at home. She notes that the nurse here found her temperature to be 99.8, suspects that that is a fever for her. She denies smoking history, history of pneumonia or bronchitis in the past. She has been taking Tussionex Mucinex hydroxyzine, albuterol inhaler with no relief. She denies any chest pain but she complains of fullness and decreased hearing in the ears   Review of Systems Past Medical History   Diagnosis  Date   .  Allergy    .  Anemia    .  Anxiety    .  Depression    .  GERD (gastroesophageal reflux disease)    .  Heart murmur    .  Hyperlipidemia    .  Hypertension    .  Thyroid disease      nodule   .  Ulcer    .  Diverticulosis    .  Sleep apnea      on C-pap machine   .  Arthritis    .  Vertigo    .  Hernia    .  IBS (irritable bowel syndrome)    .  Obesity     Past Surgical History   Procedure  Laterality  Date   .  Cholecystectomy     .  Cesarean section     .  Cervical fusion     .  Inguinal hernia repair     .  Partial hysterectomy      Family History   Problem  Relation  Age of Onset   .  Colon cancer  Maternal Grandfather    .  Hypertension  Maternal Grandfather    .  Cancer  Maternal Grandfather      lung   .  Hyperlipidemia  Mother    .  Hypertension  Mother    .  Hypertension  Maternal Grandmother    .  Hypertension  Brother    .  Hypertension  Sister    .  Cancer  Maternal Aunt      lung    History   Substance Use Topics   .  Smoking status:  Former Smoker     Quit date:  07/24/2008   .  Smokeless tobacco:  Never Used   .  Alcohol Use:  Yes      Comment: rarely    OB History    Grav  Para  Term  Preterm  Abortions  TAB  SAB  Ect  Mult  Living    3  2  2   1  1      2      Review of Systems  Constitutional: Positive for chills and fatigue. Negative for fever.  HENT: Positive for ear pain, congestion, sore throat and voice change. Negative for hearing loss, neck stiffness and ear discharge.  Respiratory: Positive for cough. Negative for chest tightness.  Cardiovascular: Positive for chest pain.  Gastrointestinal: Negative for nausea, vomiting, abdominal pain and diarrhea.  Skin: Negative for rash.  All other systems reviewed and are negative.  Allergies   Aspirin; Hydrocodone-acetaminophen; Moxifloxacin; and Oxycodone  Home Medications    Current Outpatient Rx  Name   Route   Sig   Dispense   Refill   .  amitriptyline (ELAVIL) 25 MG tablet   Oral   Take 1 tablet (25 mg total) by mouth at bedtime.   30 tablet   2   .  dicyclomine (BENTYL) 20 MG tablet   Oral   Take 1 tablet (20 mg total) by mouth 2 (two) times daily.   20 tablet   0   .  hydrOXYzine (VISTARIL) 25 MG capsule     TAKE ONE-HALF TO TWO CAPSULES BY MOUTH 4 TIMES DAILY AS NEEDED FOR VERTIGO   120 capsule   0   .  omeprazole (PRILOSEC OTC) 20 MG tablet   Oral   Take 1 tablet (20 mg total) by mouth daily.   30 tablet   11   .  Phenylephrine-DM-GG (MUCINEX FAST-MAX CONGEST COUGH PO)   Oral   Take 10 mLs by mouth every 6 (six) hours.       .  traMADol (ULTRAM) 50 MG tablet   Oral   Take 1-2 tablets (50-100 mg total) by mouth 2 (two) times daily as needed for pain.   60 tablet   0   .  benzonatate (TESSALON PERLES) 100 MG capsule   Oral   Take 1 capsule (100 mg total) by mouth 3 (three) times daily as needed for cough.   20 capsule   0   .  chlorpheniramine-HYDROcodone (TUSSIONEX PENNKINETIC ER) 10-8 MG/5ML LQCR   Oral   Take 5 mLs by mouth every 12 (twelve) hours as needed.   80 mL   0   .  predniSONE (DELTASONE) 20 MG tablet   Oral   Take 2 tablets (40 mg total) by mouth daily.   12 tablet   0   BP 148/97  Pulse 80  Temp(Src) 99.8 F (37.7 C) (Oral)  Resp 24  SpO2 100%  Physical Exam   Nursing note and vitals reviewed.  Constitutional: She appears well-developed and well-nourished. No distress.  HENT:  Head: Normocephalic and atraumatic.  Right Ear: Tympanic membrane and external ear normal. No mastoid tenderness.  Left Ear: Tympanic membrane and external ear normal. No mastoid tenderness.  Mouth/Throat: Uvula is midline, oropharynx is clear and moist and mucous membranes are normal. Mucous membranes are not pale and not dry. No oropharyngeal exudate or posterior oropharyngeal edema.  Voice is very hoarse, no stridor, slight erythema to ear canal on right  Eyes: Conjunctivae and EOM are normal. No scleral icterus.  Pulmonary/Chest: She has wheezes. She has no rales. She exhibits tenderness.  Abdominal: Soft. She exhibits no distension. There is no tenderness. There is no rebound and no guarding.  Skin: Skin is warm and dry. No rash noted. She is not diaphoretic.    ED -recent visit on 5/17   Procedures (including critical care time)  Labs Reviewed   CBC WITH DIFFERENTIAL - Abnormal; Notable for the following:    Monocytes Absolute  1.1 (*)     All other components within normal limits   BASIC METABOLIC PANEL   Dg Chest 2 View  04/17/2013 *RADIOLOGY REPORT* Clinical Data: Chest pain. CHEST - 2 VIEW Comparison: 07/30/2010. Findings: Cardiomegaly. Calcified tortuous aorta. Mild prominence of the central markings suggesting chronic bronchitic change without focal infiltrates or edema. No effusion or pneumothorax. Prior cervical surgery. No osseous findings. IMPRESSION: Cardiomegaly. Mild bronchitic change. No active infiltrates. Similar appearance to priors. Original Report Authenticated By: Davonna Belling, M.D.  Objective:   Physical Exam  As above      Assessment & Plan:   Acute bronchitis/laryngitis Start the patient on azithromycin, Flonase, Ocean nasal spray, azithromycin for 7 days  The patient has been advised to followup in one week if no symptoms in  routine followup in a month We'll also repeat a chest x-ray today to ensure there is no pneumonia

## 2013-04-27 NOTE — Progress Notes (Signed)
Patient states she was seen last week in the ED DX- bronchitis States still congested  Ears are clogged Laryngitis Feels dizzy at times Still taking cough medicine

## 2013-04-29 ENCOUNTER — Telehealth: Payer: Self-pay | Admitting: Family Medicine

## 2013-04-29 NOTE — Telephone Encounter (Signed)
Chest xr was similar to priors and showed no pna. Thanks AW.

## 2013-04-29 NOTE — Telephone Encounter (Signed)
04/29/13 Patient made aware chest x-ray showed no PNA. P.Elanora Quin,RN BSN [facility]

## 2013-04-29 NOTE — Telephone Encounter (Signed)
Pt calling about results for x-ray completed on 04/27/13. Please F/U with pt.

## 2013-05-19 ENCOUNTER — Ambulatory Visit: Payer: Self-pay | Admitting: Obstetrics & Gynecology

## 2013-09-01 ENCOUNTER — Emergency Department (HOSPITAL_COMMUNITY)
Admission: EM | Admit: 2013-09-01 | Discharge: 2013-09-01 | Disposition: A | Discharge status: Home / Self Care | Payer: Self-pay | Attending: Emergency Medicine | Admitting: Emergency Medicine

## 2013-09-01 ENCOUNTER — Emergency Department (HOSPITAL_COMMUNITY): Payer: Self-pay

## 2013-09-01 ENCOUNTER — Encounter (HOSPITAL_COMMUNITY): Payer: Self-pay | Admitting: Emergency Medicine

## 2013-09-01 DIAGNOSIS — E669 Obesity, unspecified: Secondary | ICD-10-CM | POA: Insufficient documentation

## 2013-09-01 DIAGNOSIS — Z8639 Personal history of other endocrine, nutritional and metabolic disease: Secondary | ICD-10-CM | POA: Insufficient documentation

## 2013-09-01 DIAGNOSIS — I10 Essential (primary) hypertension: Secondary | ICD-10-CM | POA: Insufficient documentation

## 2013-09-01 DIAGNOSIS — F3289 Other specified depressive episodes: Secondary | ICD-10-CM | POA: Insufficient documentation

## 2013-09-01 DIAGNOSIS — Z792 Long term (current) use of antibiotics: Secondary | ICD-10-CM | POA: Insufficient documentation

## 2013-09-01 DIAGNOSIS — IMO0002 Reserved for concepts with insufficient information to code with codable children: Secondary | ICD-10-CM | POA: Insufficient documentation

## 2013-09-01 DIAGNOSIS — G4730 Sleep apnea, unspecified: Secondary | ICD-10-CM | POA: Insufficient documentation

## 2013-09-01 DIAGNOSIS — Z862 Personal history of diseases of the blood and blood-forming organs and certain disorders involving the immune mechanism: Secondary | ICD-10-CM | POA: Insufficient documentation

## 2013-09-01 DIAGNOSIS — M7989 Other specified soft tissue disorders: Secondary | ICD-10-CM | POA: Insufficient documentation

## 2013-09-01 DIAGNOSIS — Z8739 Personal history of other diseases of the musculoskeletal system and connective tissue: Secondary | ICD-10-CM | POA: Insufficient documentation

## 2013-09-01 DIAGNOSIS — M79609 Pain in unspecified limb: Secondary | ICD-10-CM | POA: Insufficient documentation

## 2013-09-01 DIAGNOSIS — Z79899 Other long term (current) drug therapy: Secondary | ICD-10-CM | POA: Insufficient documentation

## 2013-09-01 DIAGNOSIS — R011 Cardiac murmur, unspecified: Secondary | ICD-10-CM | POA: Insufficient documentation

## 2013-09-01 DIAGNOSIS — K219 Gastro-esophageal reflux disease without esophagitis: Secondary | ICD-10-CM | POA: Insufficient documentation

## 2013-09-01 DIAGNOSIS — F411 Generalized anxiety disorder: Secondary | ICD-10-CM | POA: Insufficient documentation

## 2013-09-01 DIAGNOSIS — Z87891 Personal history of nicotine dependence: Secondary | ICD-10-CM | POA: Insufficient documentation

## 2013-09-01 DIAGNOSIS — F329 Major depressive disorder, single episode, unspecified: Secondary | ICD-10-CM | POA: Insufficient documentation

## 2013-09-01 DIAGNOSIS — Z872 Personal history of diseases of the skin and subcutaneous tissue: Secondary | ICD-10-CM | POA: Insufficient documentation

## 2013-09-01 DIAGNOSIS — K589 Irritable bowel syndrome without diarrhea: Secondary | ICD-10-CM | POA: Insufficient documentation

## 2013-09-01 DIAGNOSIS — M79602 Pain in left arm: Secondary | ICD-10-CM

## 2013-09-01 LAB — CBC WITH DIFFERENTIAL/PLATELET
Basophils Relative: 0 % (ref 0–1)
Eosinophils Absolute: 0.3 10*3/uL (ref 0.0–0.7)
Hemoglobin: 12.3 g/dL (ref 12.0–15.0)
Lymphs Abs: 4.9 10*3/uL — ABNORMAL HIGH (ref 0.7–4.0)
MCH: 28.9 pg (ref 26.0–34.0)
Monocytes Relative: 6 % (ref 3–12)
Neutro Abs: 5.5 10*3/uL (ref 1.7–7.7)
Neutrophils Relative %: 48 % (ref 43–77)
Platelets: 345 10*3/uL (ref 150–400)
RBC: 4.26 MIL/uL (ref 3.87–5.11)

## 2013-09-01 LAB — BASIC METABOLIC PANEL
Chloride: 101 mEq/L (ref 96–112)
GFR calc Af Amer: >90 mL/min (ref >90)
GFR calc non Af Amer: >90 mL/min (ref >90)
Glucose, Bld: 91 mg/dL (ref 70–99)
Potassium: 4 mEq/L (ref 3.5–5.1)
Sodium: 135 mEq/L (ref 135–145)

## 2013-09-01 MED ORDER — PROMETHAZINE HCL 25 MG PO TABS: 25.0000 mg | ORAL_TABLET | Freq: Four times a day (QID) | ORAL | Status: DC | PRN

## 2013-09-01 MED ORDER — ONDANSETRON HCL 4 MG/2ML IJ SOLN
4.0000 mg | Freq: Once | INTRAMUSCULAR | Status: AC
Start: 2013-09-01 — End: 2013-09-01
  Administered 2013-09-01: 4 mg via INTRAVENOUS
  Filled 2013-09-01: qty 2

## 2013-09-01 MED ORDER — SODIUM CHLORIDE 0.9 % IV BOLUS (SEPSIS)
500.0000 mL | Freq: Once | INTRAVENOUS | Status: AC
  Administered 2013-09-01: 500 mL via INTRAVENOUS

## 2013-09-01 MED ORDER — TRAMADOL HCL 50 MG PO TABS: 50.0000 mg | ORAL_TABLET | Freq: Four times a day (QID) | ORAL | Status: DC | PRN

## 2013-09-01 MED ORDER — DEXAMETHASONE SODIUM PHOSPHATE 10 MG/ML IJ SOLN
10.0000 mg | Freq: Once | INTRAMUSCULAR | Status: AC
  Administered 2013-09-01: 10 mg via INTRAVENOUS
  Filled 2013-09-01: qty 1

## 2013-09-01 MED ORDER — MORPHINE SULFATE 4 MG/ML IJ SOLN
4.0000 mg | Freq: Once | INTRAMUSCULAR | Status: AC
  Administered 2013-09-01: 4 mg via INTRAVENOUS
  Filled 2013-09-01: qty 1

## 2013-09-01 NOTE — ED Notes (Signed)
Patient transported to CT 

## 2013-09-01 NOTE — ED Provider Notes (Signed)
Medical screening examination/treatment/procedure(s) were performed by non-physician practitioner and as supervising physician I was immediately available for consultation/collaboration.   Note: History of present illness should read the patient was taken off her medications because it is well controlled with diet and lifestyle changes.  Celene Kras, MD 09/01/13 2151

## 2013-09-01 NOTE — ED Notes (Signed)
Pt states that she has been having leg numbness off and on for a week. States that her arm started this morning.  States that she has had something similar to it on the right side and it was a pinched nerve.  States it is painful and throbbing.

## 2013-09-01 NOTE — ED Provider Notes (Signed)
CSN: [id number]     Arrival date & time 09/01/13  1627 History   First MD Initiated Contact with Patient 09/01/13 1904     Chief Complaint  Patient presents with  . Numbness   (Consider location/radiation/quality/duration/timing/severity/associated sxs/prior Treatment) HPI  Paula [name] is a 52 y.o. female with PMH significant for c-spine fusion c/o intermittent left leg weakness (denies numbness but endorses a parasthesis) only when she sits down on the toilette x1 month. She states that she could not move the leg at all and had to have her husband help her up. These episode resolve when she stands up. She woke today with atraumatic severe 10/10 left upper extremity pain and pins and needles paraesthesia (denies weakness, numbness). She states that the arm is swollen and warm. Denies dysarthia, ataxia, change in vision, active weakness, CP, SOB, change in bowel or bladder habits. Patient is former smoker, has diagnosis of hypertension but was taken off her medications by Dr. Jennette Banker called because it is well-controlled with white cell changes. Denies diabetes.  Past Medical History  Diagnosis Date  . Allergy   . Anemia   . Anxiety   . Depression   . GERD (gastroesophageal reflux disease)   . Heart murmur   . Hyperlipidemia   . Hypertension   . Thyroid disease     nodule  . Ulcer   . Diverticulosis   . Sleep apnea     on C-pap machine  . Arthritis   . Vertigo   . Hernia   . IBS (irritable bowel syndrome)   . Obesity    Past Surgical History  Procedure Laterality Date  . Cholecystectomy    . Cesarean section    . Cervical fusion    . Inguinal hernia repair    . Partial hysterectomy     Family History  Problem Relation Age of Onset  . Colon cancer Maternal Grandfather   . Hypertension Maternal Grandfather   . Cancer Maternal Grandfather     lung  . Hyperlipidemia Mother   . Hypertension Mother   . Hypertension Maternal Grandmother   . Hypertension Brother   .  Hypertension Sister   . Cancer Maternal Aunt     lung   History  Substance Use Topics  . Smoking status: Former Smoker    Quit date: 07/24/2008  . Smokeless tobacco: Never Used  . Alcohol Use: Yes     Comment: rarely   OB History   Grav Para Term Preterm Abortions TAB SAB Ect Mult Living   3 2 2  1 1    2      Review of Systems  Constitutional: Negative for fever.  Respiratory: Negative for shortness of breath.   Cardiovascular: Negative for chest pain.  Gastrointestinal: Negative for nausea, vomiting, abdominal pain and diarrhea.  All other systems reviewed and are negative.   10 systems reviewed and found to be negative, except as noted in the HPI  Allergies  Aspirin; Hydrocodone-acetaminophen; Moxifloxacin; and Oxycodone  Home Medications   Current Outpatient Rx  Name  Route  Sig  Dispense  Refill  . amitriptyline (ELAVIL) 25 MG tablet   Oral   Take 1 tablet (25 mg total) by mouth at bedtime.   30 tablet   2   . azithromycin (ZITHROMAX) 500 MG tablet   Oral   Take 1 tablet (500 mg total) by mouth daily.   7 tablet   0   . benzonatate (TESSALON PERLES) 100 MG capsule  Oral   Take 1 capsule (100 mg total) by mouth 3 (three) times daily as needed for cough.   20 capsule   0   . chlorpheniramine-HYDROcodone (TUSSIONEX PENNKINETIC ER) 10-8 MG/5ML LQCR   Oral   Take 5 mLs by mouth every 12 (twelve) hours as needed.   80 mL   0   . dicyclomine (BENTYL) 20 MG tablet   Oral   Take 1 tablet (20 mg total) by mouth 2 (two) times daily.   20 tablet   0   . fluticasone (FLONASE) 50 MCG/ACT nasal spray   Nasal   Place 2 sprays into the nose daily.   16 g   6   . hydrOXYzine (VISTARIL) 25 MG capsule      TAKE ONE-HALF TO TWO CAPSULES BY MOUTH 4 TIMES DAILY AS NEEDED FOR VERTIGO   120 capsule   0   . omeprazole (PRILOSEC OTC) 20 MG tablet   Oral   Take 1 tablet (20 mg total) by mouth daily.   30 tablet   11   . Phenylephrine-DM-GG (MUCINEX FAST-MAX  CONGEST COUGH PO)   Oral   Take 10 mLs by mouth every 6 (six) hours.         . predniSONE (DELTASONE) 20 MG tablet   Oral   Take 2 tablets (40 mg total) by mouth daily.   12 tablet   0   . sodium chloride (OCEAN NASAL SPRAY) 0.65 % nasal spray   Nasal   Place 1 spray into the nose as needed for congestion.   30 mL   12   . sodium chloride (OCEAN NASAL SPRAY) 0.65 % nasal spray   Nasal   Place 1 spray into the nose as needed for congestion.   30 mL   12   . traMADol (ULTRAM) 50 MG tablet   Oral   Take 1-2 tablets (50-100 mg total) by mouth 2 (two) times daily as needed for pain.   60 tablet   0    BP 182/108  Pulse 77  Temp(Src) 99 F (37.2 C) (Oral)  Resp 16  SpO2 100% Physical Exam  Nursing note and vitals reviewed. Constitutional: She is oriented to person, place, and time. She appears well-developed and well-nourished. No distress.  HENT:  Head: Normocephalic and atraumatic.  Mouth/Throat: Oropharynx is clear and moist.  Eyes: Conjunctivae and EOM are normal. Pupils are equal, round, and reactive to light.  Neck: Normal range of motion. Neck supple.  No midline tenderness to palpation or step-offs appreciated.    Cardiovascular: Normal rate, regular rhythm and intact distal pulses.   Pulmonary/Chest: Effort normal and breath sounds normal. No stridor. No respiratory distress. She has no wheezes. She has no rales. She exhibits no tenderness.  Abdominal: Soft. Bowel sounds are normal. She exhibits no distension and no mass. There is no tenderness. There is no rebound and no guarding.  Musculoskeletal: Normal range of motion.  Strength is 5 out of 5 to bilateral lower extremities at hip and knee, extensor hallucis longus 5 out of 5. Ankle strength 5 out of 5, no clonus, neurovascularly intact.   Neurological: She is alert and oriented to person, place, and time.  Cranial nerves III through XII intact, negative pronator drift, finger to nose and heel-to-shin  coordinated, sensation intact to pinprick and light touch, gait is coordinated and Romberg is negative.   Strength 4/5 to the left upper extremity, patient states that movement exacerbates the pain she denies  feeling weak.  Psychiatric: She has a normal mood and affect.    ED Course  Procedures (including critical care time) Labs Review Labs Reviewed  CBC WITH DIFFERENTIAL - Abnormal; Notable for the following:    WBC 11.4 (*)    Lymphs Abs 4.9 (*)    All other components within normal limits  BASIC METABOLIC PANEL   Imaging Review Ct Head W Contrast  09/01/2013   CLINICAL DATA:  Left-sided weakness and numbness. History of cervical fusion.  EXAM: CT HEAD WITHOUT CONTRAST  CT CERVICAL SPINE WITHOUT CONTRAST  TECHNIQUE: Multidetector CT imaging of the head and cervical spine was performed following the standard protocol without intravenous contrast. Multiplanar CT image reconstructions of the cervical spine were also generated.  COMPARISON:  Head CT 01/04/2010.  FINDINGS: CT HEAD FINDINGS  The ventricles are normal in size and configuration. No extra-axial fluid collections are identified. The gray-white differentiation is normal. No CT findings for acute intracranial process such as hemorrhage or infarction. No mass lesions. The brainstem and cerebellum are grossly normal.  The bony structures are intact. The paranasal sinuses and mastoid air cells are clear. The globes are intact.  CT CERVICAL SPINE FINDINGS  Surgical changes related to a corpectomy and fusion from C4 to C7. No complicating features are demonstrated. Calcifications noted posteriorly at C3-4 could be ossification of the posterior longitudinal leg. No acute fracture or abnormal soft tissue swelling. The facets are normally aligned.  IMPRESSION: CT HEAD IMPRESSION  No acute intracranial findings.  CT CERVICAL SPINE IMPRESSION  Surgical changes involving the cervical spine but no complicating features or acute abnormality.    Electronically Signed   By: Loralie Champagne M.D.   On: 09/01/2013 20:17   Ct Cervical Spine Wo Contrast  09/01/2013   CLINICAL DATA:  Left-sided weakness and numbness. History of cervical fusion.  EXAM: CT HEAD WITHOUT CONTRAST  CT CERVICAL SPINE WITHOUT CONTRAST  TECHNIQUE: Multidetector CT imaging of the head and cervical spine was performed following the standard protocol without intravenous contrast. Multiplanar CT image reconstructions of the cervical spine were also generated.  COMPARISON:  Head CT 01/04/2010.  FINDINGS: CT HEAD FINDINGS  The ventricles are normal in size and configuration. No extra-axial fluid collections are identified. The gray-white differentiation is normal. No CT findings for acute intracranial process such as hemorrhage or infarction. No mass lesions. The brainstem and cerebellum are grossly normal.  The bony structures are intact. The paranasal sinuses and mastoid air cells are clear. The globes are intact.  CT CERVICAL SPINE FINDINGS  Surgical changes related to a corpectomy and fusion from C4 to C7. No complicating features are demonstrated. Calcifications noted posteriorly at C3-4 could be ossification of the posterior longitudinal leg. No acute fracture or abnormal soft tissue swelling. The facets are normally aligned.  IMPRESSION: CT HEAD IMPRESSION  No acute intracranial findings.  CT CERVICAL SPINE IMPRESSION  Surgical changes involving the cervical spine but no complicating features or acute abnormality.   Electronically Signed   By: Loralie Champagne M.D.   On: 09/01/2013 20:17    MDM   1. Left arm pain    Filed Vitals:   09/01/13 1700 09/01/13 1939 09/01/13 2124  BP: 182/108 158/81 158/87  Pulse: 77 75 66  Temp: 99 F (37.2 C) 98.9 F (37.2 C) 98.4 F (36.9 C)  TempSrc: Oral Oral Oral  Resp: 16 18 16   SpO2: 100% 98% 100%     ABYGALE KARPF is a 52 y.o. female  status post C-spine fusion in her mouth past complaining of leg weakness intermittently for one  month exacerbated by sitting on the toilet alleviated by standing up she also has severe pain in the left arm starting this morning. Neuro exam is nonfocal, the left arm is slightly weaker, however the patient states it is secondary to pain and discomfort. Head CT and cervical spine CT showed no abnormalities. Discussed case with attending who agrees with plan and stability to d/c to home.    Medications  morphine 4 MG/ML injection 4 mg (4 mg Intravenous Given 09/01/13 2111)  ondansetron (ZOFRAN) injection 4 mg (4 mg Intravenous Given 09/01/13 2107)  dexamethasone (DECADRON) injection 10 mg (10 mg Intravenous Given 09/01/13 2115)  sodium chloride 0.9 % bolus 500 mL (500 mLs Intravenous New Bag/Given 09/01/13 2105)    Pt is hemodynamically stable, appropriate for, and amenable to discharge at this time. Pt verbalized understanding and agrees with care plan. All questions answered. Outpatient follow-up and specific return precautions discussed.    New Prescriptions   PROMETHAZINE (PHENERGAN) 25 MG TABLET    Take 1 tablet (25 mg total) by mouth every 6 (six) hours as needed for nausea.   TRAMADOL (ULTRAM) 50 MG TABLET    Take 1 tablet (50 mg total) by mouth every 6 (six) hours as needed for pain.    Note: Portions of this report may have been transcribed using voice recognition software. Every effort was made to ensure accuracy; however, inadvertent computerized transcription errors may be present      Wynetta Emery, PA-C 09/01/13 2130

## 2013-10-07 ENCOUNTER — Other Ambulatory Visit: Payer: Self-pay

## 2014-01-08 ENCOUNTER — Emergency Department (HOSPITAL_COMMUNITY)
Admission: EM | Admit: 2014-01-08 | Discharge: 2014-01-08 | Disposition: A | Discharge status: Home / Self Care | Payer: BC Managed Care – PPO | Attending: Emergency Medicine | Admitting: Emergency Medicine

## 2014-01-08 ENCOUNTER — Encounter (HOSPITAL_COMMUNITY): Payer: Self-pay | Admitting: Emergency Medicine

## 2014-01-08 DIAGNOSIS — F411 Generalized anxiety disorder: Secondary | ICD-10-CM | POA: Insufficient documentation

## 2014-01-08 DIAGNOSIS — Z862 Personal history of diseases of the blood and blood-forming organs and certain disorders involving the immune mechanism: Secondary | ICD-10-CM | POA: Insufficient documentation

## 2014-01-08 DIAGNOSIS — Z9981 Dependence on supplemental oxygen: Secondary | ICD-10-CM | POA: Insufficient documentation

## 2014-01-08 DIAGNOSIS — Z87891 Personal history of nicotine dependence: Secondary | ICD-10-CM | POA: Insufficient documentation

## 2014-01-08 DIAGNOSIS — R531 Weakness: Secondary | ICD-10-CM

## 2014-01-08 DIAGNOSIS — I10 Essential (primary) hypertension: Secondary | ICD-10-CM | POA: Insufficient documentation

## 2014-01-08 DIAGNOSIS — E669 Obesity, unspecified: Secondary | ICD-10-CM | POA: Insufficient documentation

## 2014-01-08 DIAGNOSIS — G4730 Sleep apnea, unspecified: Secondary | ICD-10-CM | POA: Insufficient documentation

## 2014-01-08 DIAGNOSIS — R5381 Other malaise: Secondary | ICD-10-CM | POA: Insufficient documentation

## 2014-01-08 DIAGNOSIS — Z8719 Personal history of other diseases of the digestive system: Secondary | ICD-10-CM | POA: Insufficient documentation

## 2014-01-08 DIAGNOSIS — R259 Unspecified abnormal involuntary movements: Secondary | ICD-10-CM | POA: Insufficient documentation

## 2014-01-08 DIAGNOSIS — Z8639 Personal history of other endocrine, nutritional and metabolic disease: Secondary | ICD-10-CM | POA: Insufficient documentation

## 2014-01-08 DIAGNOSIS — Z872 Personal history of diseases of the skin and subcutaneous tissue: Secondary | ICD-10-CM | POA: Insufficient documentation

## 2014-01-08 DIAGNOSIS — R011 Cardiac murmur, unspecified: Secondary | ICD-10-CM | POA: Insufficient documentation

## 2014-01-08 DIAGNOSIS — Z8739 Personal history of other diseases of the musculoskeletal system and connective tissue: Secondary | ICD-10-CM | POA: Insufficient documentation

## 2014-01-08 DIAGNOSIS — R5383 Other fatigue: Principal | ICD-10-CM

## 2014-01-08 LAB — URINALYSIS, ROUTINE W REFLEX MICROSCOPIC
BILIRUBIN URINE: NEGATIVE
Glucose, UA: NEGATIVE mg/dL
HGB URINE DIPSTICK: NEGATIVE
Ketones, ur: NEGATIVE mg/dL
Leukocytes, UA: NEGATIVE
NITRITE: NEGATIVE
PH: 7 (ref 5.0–8.0)
Protein, ur: NEGATIVE mg/dL
SPECIFIC GRAVITY, URINE: 1.011 (ref 1.005–1.030)
Urobilinogen, UA: 0.2 mg/dL (ref 0.0–1.0)

## 2014-01-08 LAB — CBC WITH DIFFERENTIAL/PLATELET
BASOS ABS: 0 10*3/uL (ref 0.0–0.1)
BASOS PCT: 0 % (ref 0–1)
EOS ABS: 0.3 10*3/uL (ref 0.0–0.7)
EOS PCT: 3 % (ref 0–5)
HCT: 38 % (ref 36.0–46.0)
Hemoglobin: 12.6 g/dL (ref 12.0–15.0)
LYMPHS PCT: 50 % — AB (ref 12–46)
Lymphs Abs: 5.2 10*3/uL — ABNORMAL HIGH (ref 0.7–4.0)
MCH: 29.2 pg (ref 26.0–34.0)
MCHC: 33.2 g/dL (ref 30.0–36.0)
MCV: 88 fL (ref 78.0–100.0)
Monocytes Absolute: 0.7 10*3/uL (ref 0.1–1.0)
Monocytes Relative: 6 % (ref 3–12)
Neutro Abs: 4.3 10*3/uL (ref 1.7–7.7)
Neutrophils Relative %: 41 % — ABNORMAL LOW (ref 43–77)
PLATELETS: 353 10*3/uL (ref 150–400)
RBC: 4.32 MIL/uL (ref 3.87–5.11)
RDW: 12.3 % (ref 11.5–15.5)
WBC: 10.4 10*3/uL (ref 4.0–10.5)

## 2014-01-08 LAB — COMPREHENSIVE METABOLIC PANEL
ALBUMIN: 4.3 g/dL (ref 3.5–5.2)
ALT: 34 U/L (ref 0–35)
AST: 28 U/L (ref 0–37)
Alkaline Phosphatase: 75 U/L (ref 39–117)
BUN: 10 mg/dL (ref 6–23)
CALCIUM: 10 mg/dL (ref 8.4–10.5)
CO2: 26 mEq/L (ref 19–32)
CREATININE: 0.86 mg/dL (ref 0.50–1.10)
Chloride: 102 mEq/L (ref 96–112)
GFR calc Af Amer: 88 mL/min — ABNORMAL LOW (ref >90)
GFR calc non Af Amer: 76 mL/min — ABNORMAL LOW (ref >90)
Glucose, Bld: 93 mg/dL (ref 70–99)
Potassium: 4.6 mEq/L (ref 3.7–5.3)
SODIUM: 141 meq/L (ref 137–147)
TOTAL PROTEIN: 7.7 g/dL (ref 6.0–8.3)
Total Bilirubin: 0.4 mg/dL (ref 0.3–1.2)

## 2014-01-08 LAB — TROPONIN I: Troponin I: <0.3 ng/mL (ref <0.30)

## 2014-01-08 MED ORDER — LORAZEPAM 2 MG/ML IJ SOLN
1.0000 mg | Freq: Once | INTRAMUSCULAR | Status: DC
  Filled 2014-01-08: qty 1

## 2014-01-08 MED ORDER — SODIUM CHLORIDE 0.9 % IV BOLUS (SEPSIS): 1000.0000 mL | Freq: Once | INTRAVENOUS | Status: DC

## 2014-01-08 MED ORDER — LORAZEPAM 1 MG PO TABS: 1.0000 mg | ORAL_TABLET | Freq: Four times a day (QID) | ORAL | Status: DC | PRN

## 2014-01-08 MED ORDER — SODIUM CHLORIDE 0.9 % IV BOLUS (SEPSIS): 2000.0000 mL | Freq: Once | INTRAVENOUS | Status: DC

## 2014-01-08 MED ORDER — LORAZEPAM 2 MG/ML IJ SOLN
2.0000 mg | Freq: Once | INTRAMUSCULAR | Status: AC
  Administered 2014-01-08: 2 mg via INTRAMUSCULAR

## 2014-01-08 NOTE — Discharge Instructions (Signed)
Your caregiver has seen you today because you are having problems with feelings of weakness, dizziness, and/or fatigue. Weakness has many different causes, some of which are common and others are very rare. Your caregiver has considered some of the most common causes of weakness and feels it is safe for you to go home and be observed. Not every illness or injury can be identified during an emergency department visit, thus follow-up with your primary healthcare provider is important. Medical conditions can also worsen, so it is also important to return immediately as directed below, or if you have other serious concerns develop. RETURN IMMEDIATELY IF you develop new shortness of breath, chest pain, fever, have difficulty moving parts of your body (new weakness, numbness, or incoordination), sudden change in speech, vision, swallowing, or understanding, faint or develop new dizziness, severe headache, become poorly responsive or have an altered mental status compared to baseline for you, new rash, abdominal pain, or bloody stools,  Return sooner also if you develop new problems for which you have not talked to your caregiver but you feel may be emergency medical conditions, or are unable to be cared for safely at home.  

## 2014-01-08 NOTE — ED Notes (Signed)
Main lab phlebotomy called to attempt labs.

## 2014-01-08 NOTE — ED Notes (Signed)
Main lab phlebotomy at bedside. 

## 2014-01-08 NOTE — ED Notes (Signed)
Phlebotomy to attempt labs 

## 2014-01-08 NOTE — ED Provider Notes (Signed)
CSN: [id number]     Arrival date & time 01/08/14  1148 History   First MD Initiated Contact with Patient 01/08/14 1201     Chief Complaint  Patient presents with  . Dizziness  . Tremors   (Consider location/radiation/quality/duration/timing/severity/associated sxs/prior Treatment) HPI This 53 year old female has a one-week history of gradual onset generalized weakness with lightheadedness when she stands up without vertigo, she is no headache, she is no altered mental status, she is no change in speech vision swallowing or understanding, she is no focal or new localizing weakness numbness or incoordination and no change in bowel or bladder function, she is baseline left arm and left leg numbness unchanged for months if not years. She is no fever no rash no trauma no cough no chest pain no shortness of breath no abdominal pain. There is no treatment prior to arrival. She is no suicidal or homicidal ideation or hallucinations. She has had vertigo in the past and this feels totally different. Past Medical History  Diagnosis Date  . Allergy   . Anemia   . Anxiety   . Depression   . GERD (gastroesophageal reflux disease)   . Heart murmur   . Hyperlipidemia   . Hypertension   . Thyroid disease     nodule  . Ulcer   . Diverticulosis   . Sleep apnea     on C-pap machine  . Arthritis   . Vertigo   . Hernia   . IBS (irritable bowel syndrome)   . Obesity    Past Surgical History  Procedure Laterality Date  . Cholecystectomy    . Cesarean section    . Cervical fusion    . Inguinal hernia repair    . Partial hysterectomy     Family History  Problem Relation Age of Onset  . Colon cancer Maternal Grandfather   . Hypertension Maternal Grandfather   . Cancer Maternal Grandfather     lung  . Hyperlipidemia Mother   . Hypertension Mother   . Hypertension Maternal Grandmother   . Hypertension Brother   . Hypertension Sister   . Cancer Maternal Aunt     lung   History  Substance Use  Topics  . Smoking status: Former Smoker    Quit date: 07/24/2008  . Smokeless tobacco: Never Used  . Alcohol Use: Yes     Comment: rarely   OB History   Grav Para Term Preterm Abortions TAB SAB Ect Mult Living   3 2 2  1 1    2      Review of Systems 10 Systems reviewed and are negative for acute change except as noted in the HPI. Allergies  Aspirin; Hydrocodone-acetaminophen; Moxifloxacin; and Oxycodone  Home Medications   Current Outpatient Rx  Name  Route  Sig  Dispense  Refill  . LORazepam (ATIVAN) 1 MG tablet   Oral   Take 1 tablet (1 mg total) by mouth every 6 (six) hours as needed for anxiety (tremors).   10 tablet   0    BP 141/94  Pulse 76  Temp(Src) 98.7 F (37.1 C) (Oral)  Resp 16  SpO2 98% Physical Exam  Nursing note and vitals reviewed. Constitutional:  Awake, alert, nontoxic appearance with baseline speech for patient.  Appears to have some mild generalized tremors and shaking of her head arms and legs which does not appear to be seizure-like activity.  HENT:  Head: Atraumatic.  Mouth/Throat: No oropharyngeal exudate.  Eyes: EOM are normal. Pupils are equal,  round, and reactive to light. Right eye exhibits no discharge. Left eye exhibits no discharge.  Neck: Neck supple.  Cardiovascular: Normal rate and regular rhythm.   No murmur heard. Pulmonary/Chest: Effort normal and breath sounds normal. No stridor. No respiratory distress. She has no wheezes. She has no rales. She exhibits no tenderness.  Abdominal: Soft. Bowel sounds are normal. She exhibits no mass. There is no tenderness. There is no rebound.  Musculoskeletal: She exhibits no tenderness.  Baseline ROM, moves extremities with no obvious new focal weakness.  Lymphadenopathy:    She has no cervical adenopathy.  Neurological: She is alert.  Awake, alert, cooperative and aware of situation; motor strength 5/5 bilaterally; sensation normal to light touch bilaterally; peripheral visual fields full  to confrontation; no facial asymmetry; tongue midline; major cranial nerves appear intact; no pronator drift, normal finger to nose bilaterally, baseline gait with minimal assistance without new ataxia.  Skin: No rash noted.  Psychiatric:  Appears anxious    ED Course  Procedures (including critical care time) ECG: Muse interface not available: Sinus rhythm, ventricular rate 71, right axis deviation, nonspecific diffuse T wave abnormality, compared with prior ECG right axis deviation now present  Pt feels improved after observation and/or treatment in ED.Patient / Family / Caregiver informed of clinical course, understand medical decision-making process, and agree with plan. Labs Review Labs Reviewed  CBC WITH DIFFERENTIAL - Abnormal; Notable for the following:    Neutrophils Relative % 41 (*)    Lymphocytes Relative 50 (*)    Lymphs Abs 5.2 (*)    All other components within normal limits  COMPREHENSIVE METABOLIC PANEL - Abnormal; Notable for the following:    GFR calc non Af Amer 76 (*)    GFR calc Af Amer 88 (*)    All other components within normal limits  URINALYSIS, ROUTINE W REFLEX MICROSCOPIC  TROPONIN I   Imaging Review No results found.  EKG Interpretation   None       MDM   1. Weakness    I doubt any other EMC precluding discharge at this time including, but not necessarily limited to the following:CVA., SAH, seizure.    Babette Relic, MD 01/08/14 2037

## 2014-01-08 NOTE — ED Notes (Signed)
Pt states that for the past week, she has been having dizziness and tremors (in her head).  States that her head shakes.  Denies visual difficulties, difficulty swallowing.  Has had multiple MRIs, CT.  Denies fever, NVD.

## 2014-01-08 NOTE — ED Notes (Signed)
Pt ambulated to restroom with one staff assistance and steady gait.

## 2014-01-08 NOTE — ED Notes (Signed)
Patient refused blood draw for labs.RN Bryson Corona made aware

## 2014-01-08 NOTE — ED Notes (Signed)
Pt states to the RN during IV insertion "I could punch you in the face little girl". IV catheter removed.

## 2014-01-08 NOTE — ED Notes (Signed)
Pt anxious, Charge RN to attempt IV insertion.

## 2014-01-10 ENCOUNTER — Telehealth: Payer: Self-pay | Admitting: Internal Medicine

## 2014-01-10 ENCOUNTER — Encounter (HOSPITAL_COMMUNITY): Payer: Self-pay | Admitting: Emergency Medicine

## 2014-01-10 ENCOUNTER — Emergency Department (HOSPITAL_COMMUNITY)
Admission: EM | Admit: 2014-01-10 | Discharge: 2014-01-10 | Discharge status: Against Medical Advice | Payer: BC Managed Care – PPO | Attending: Emergency Medicine | Admitting: Emergency Medicine

## 2014-01-10 ENCOUNTER — Emergency Department (HOSPITAL_COMMUNITY)
Admission: EM | Admit: 2014-01-10 | Discharge: 2014-01-10 | Disposition: A | Discharge status: Nursing Home (Includes ICF) | Payer: BC Managed Care – PPO | Source: Home / Self Care | Attending: Family Medicine | Admitting: Family Medicine

## 2014-01-10 DIAGNOSIS — G252 Other specified forms of tremor: Secondary | ICD-10-CM

## 2014-01-10 DIAGNOSIS — R011 Cardiac murmur, unspecified: Secondary | ICD-10-CM | POA: Insufficient documentation

## 2014-01-10 DIAGNOSIS — I10 Essential (primary) hypertension: Secondary | ICD-10-CM | POA: Insufficient documentation

## 2014-01-10 DIAGNOSIS — R259 Unspecified abnormal involuntary movements: Secondary | ICD-10-CM

## 2014-01-10 DIAGNOSIS — E669 Obesity, unspecified: Secondary | ICD-10-CM | POA: Insufficient documentation

## 2014-01-10 DIAGNOSIS — R109 Unspecified abdominal pain: Secondary | ICD-10-CM | POA: Insufficient documentation

## 2014-01-10 LAB — POCT URINALYSIS DIP (DEVICE)
Bilirubin Urine: NEGATIVE
GLUCOSE, UA: NEGATIVE mg/dL
Ketones, ur: NEGATIVE mg/dL
Leukocytes, UA: NEGATIVE
NITRITE: NEGATIVE
PH: 6 (ref 5.0–8.0)
Protein, ur: NEGATIVE mg/dL
Specific Gravity, Urine: 1.015 (ref 1.005–1.030)
UROBILINOGEN UA: 0.2 mg/dL (ref 0.0–1.0)

## 2014-01-10 LAB — POCT PREGNANCY, URINE: Preg Test, Ur: NEGATIVE

## 2014-01-10 NOTE — ED Notes (Signed)
Pt c/o constant abd pain around naval area onset Saturday Reports she was seen at [name] for similar sxs Sxs today include: diarrhea, HA, dizziness, urinary freq/urgency/icontinence Denies f/v, gyn sxs Alert w/no signs of acute distress.

## 2014-01-10 NOTE — ED Provider Notes (Signed)
CSN: [id number]     Arrival date & time 01/10/14  1747 History   First MD Initiated Contact with Patient 01/10/14 1819     Chief Complaint  Patient presents with  . Abdominal Pain     (Consider location/radiation/quality/duration/timing/severity/associated sxs/prior Treatment) Patient is a 53 y.o. female presenting with abdominal pain. The history is provided by the patient and the spouse.  Abdominal Pain Pain location:  Generalized (seen in ER 2/7 by dr Beola Cord, sx continue.) Pain quality: cramping   Pain radiates to:  LUQ and epigastric region Associated symptoms: diarrhea, nausea and vomiting     Past Medical History  Diagnosis Date  . Allergy   . Anemia   . Anxiety   . Depression   . GERD (gastroesophageal reflux disease)   . Heart murmur   . Hyperlipidemia   . Hypertension   . Thyroid disease     nodule  . Ulcer   . Diverticulosis   . Sleep apnea     on C-pap machine  . Arthritis   . Vertigo   . Hernia   . IBS (irritable bowel syndrome)   . Obesity    Past Surgical History  Procedure Laterality Date  . Cholecystectomy    . Cesarean section    . Cervical fusion    . Inguinal hernia repair    . Partial hysterectomy     Family History  Problem Relation Age of Onset  . Colon cancer Maternal Grandfather   . Hypertension Maternal Grandfather   . Cancer Maternal Grandfather     lung  . Hyperlipidemia Mother   . Hypertension Mother   . Hypertension Maternal Grandmother   . Hypertension Brother   . Hypertension Sister   . Cancer Maternal Aunt     lung   History  Substance Use Topics  . Smoking status: Former Smoker    Quit date: 07/24/2008  . Smokeless tobacco: Never Used  . Alcohol Use: Yes     Comment: rarely   OB History   Grav Para Term Preterm Abortions TAB SAB Ect Mult Living   3 2 2  1 1    2      Review of Systems  Constitutional: Negative.   Gastrointestinal: Positive for nausea, vomiting, abdominal pain and diarrhea.  Neurological:  Positive for tremors, light-headedness and headaches.      Allergies  Aspirin; Hydrocodone-acetaminophen; Moxifloxacin; and Oxycodone  Home Medications   Current Outpatient Rx  Name  Route  Sig  Dispense  Refill  . LORazepam (ATIVAN) 1 MG tablet   Oral   Take 1 tablet (1 mg total) by mouth every 6 (six) hours as needed for anxiety (tremors).   10 tablet   0    BP 149/85  Pulse 72  Temp(Src) 99 F (37.2 C) (Oral)  Resp 18  SpO2 96% Physical Exam  Nursing note and vitals reviewed. Constitutional: She is oriented to person, place, and time. She appears well-developed and well-nourished. She appears distressed.  Eyes: EOM are normal. Pupils are equal, round, and reactive to light.  Neck: Normal range of motion. Neck supple.  Abdominal: Soft. Bowel sounds are normal. She exhibits no mass. There is no tenderness. There is no rebound and no guarding.  Neurological: She is alert and oriented to person, place, and time. No cranial nerve deficit.  Tremulous head.  Skin: Skin is warm and dry.    ED Course  Procedures (including critical care time) Labs Review Labs Reviewed  POCT URINALYSIS DIP (  DEVICE) - Abnormal; Notable for the following:    Hgb urine dipstick TRACE (*)    All other components within normal limits   Imaging Review No results found.    MDM   Final diagnoses:  Coarse tremors   Sent for further eval of multiplicity of complaints, seen in ER 2/7 for same.     Billy Fischer, MD 01/10/14 [phone]

## 2014-01-10 NOTE — Telephone Encounter (Signed)
Patient Information:  Caller Name: Earma  Phone: [phone]  Patient: Paula Paula [name], Paula [name]  Gender: Female  DOB: August 17, 1961  Age: 53 Years  PCP: Plotnikov, Alex (Adults only)  Pregnant: No  Office Follow Up:  Does the office need to follow up with this patient?: Yes  Instructions For The Office: Patient is requesting to be seen earlier than scheduled appointment for 01/12/14  RN Note:  Patient is calling regarding weakness with diarrhea.  Seen in ED on 01/08/14 and was told everything WNL.  Appointment in office is scheduled for 01/12/14 @ 13:45.  Patient is requesting appointment for today.  Symptoms  Reason For Call & Symptoms: diarrhea & dizziness, extreme nausea. seen in ED on 01/08/14  Reviewed Health History In EMR: Yes  Reviewed Medications In EMR: Yes  Reviewed Allergies In EMR: Yes  Reviewed Surgeries / Procedures: Yes  Date of Onset of Symptoms: 01/07/2014 OB / GYN:  LMP: Unknown  Guideline(s) Used:  Diarrhea  Disposition Per Guideline:   See Within 3 Days in Office  Reason For Disposition Reached:   Patient wants to be seen  Advice Given:  Call Back If:  You become worse.  Patient Will Follow Care Advice:  YES

## 2014-01-10 NOTE — ED Notes (Signed)
Patient states that she does not want to wait anymore.  Patient is to follow up with her MD in the morning.  Apologized for the wait.

## 2014-01-11 ENCOUNTER — Observation Stay (HOSPITAL_COMMUNITY)
Admission: EM | Admit: 2014-01-11 | Discharge: 2014-01-13 | Disposition: A | Discharge status: Home / Self Care | Payer: BC Managed Care – PPO | Attending: Internal Medicine | Admitting: Internal Medicine

## 2014-01-11 ENCOUNTER — Encounter (HOSPITAL_COMMUNITY): Payer: Self-pay | Admitting: Emergency Medicine

## 2014-01-11 ENCOUNTER — Emergency Department (HOSPITAL_COMMUNITY): Payer: BC Managed Care – PPO

## 2014-01-11 DIAGNOSIS — R259 Unspecified abnormal involuntary movements: Secondary | ICD-10-CM | POA: Insufficient documentation

## 2014-01-11 DIAGNOSIS — M62838 Other muscle spasm: Secondary | ICD-10-CM | POA: Insufficient documentation

## 2014-01-11 DIAGNOSIS — M542 Cervicalgia: Secondary | ICD-10-CM

## 2014-01-11 DIAGNOSIS — E785 Hyperlipidemia, unspecified: Secondary | ICD-10-CM | POA: Insufficient documentation

## 2014-01-11 DIAGNOSIS — R251 Tremor, unspecified: Secondary | ICD-10-CM

## 2014-01-11 DIAGNOSIS — Z888 Allergy status to other drugs, medicaments and biological substances status: Secondary | ICD-10-CM | POA: Insufficient documentation

## 2014-01-11 DIAGNOSIS — Z9981 Dependence on supplemental oxygen: Secondary | ICD-10-CM | POA: Insufficient documentation

## 2014-01-11 DIAGNOSIS — K589 Irritable bowel syndrome without diarrhea: Secondary | ICD-10-CM

## 2014-01-11 DIAGNOSIS — D72829 Elevated white blood cell count, unspecified: Secondary | ICD-10-CM

## 2014-01-11 DIAGNOSIS — R1013 Epigastric pain: Secondary | ICD-10-CM | POA: Insufficient documentation

## 2014-01-11 DIAGNOSIS — R51 Headache: Secondary | ICD-10-CM

## 2014-01-11 DIAGNOSIS — R0609 Other forms of dyspnea: Principal | ICD-10-CM | POA: Insufficient documentation

## 2014-01-11 DIAGNOSIS — F329 Major depressive disorder, single episode, unspecified: Secondary | ICD-10-CM | POA: Insufficient documentation

## 2014-01-11 DIAGNOSIS — M79601 Pain in right arm: Secondary | ICD-10-CM

## 2014-01-11 DIAGNOSIS — R197 Diarrhea, unspecified: Secondary | ICD-10-CM | POA: Insufficient documentation

## 2014-01-11 DIAGNOSIS — R109 Unspecified abdominal pain: Secondary | ICD-10-CM

## 2014-01-11 DIAGNOSIS — R0989 Other specified symptoms and signs involving the circulatory and respiratory systems: Principal | ICD-10-CM | POA: Insufficient documentation

## 2014-01-11 DIAGNOSIS — F411 Generalized anxiety disorder: Secondary | ICD-10-CM | POA: Diagnosis present

## 2014-01-11 DIAGNOSIS — M722 Plantar fascial fibromatosis: Secondary | ICD-10-CM

## 2014-01-11 DIAGNOSIS — Z87891 Personal history of nicotine dependence: Secondary | ICD-10-CM | POA: Insufficient documentation

## 2014-01-11 DIAGNOSIS — Z90711 Acquired absence of uterus with remaining cervical stump: Secondary | ICD-10-CM | POA: Insufficient documentation

## 2014-01-11 DIAGNOSIS — R531 Weakness: Secondary | ICD-10-CM

## 2014-01-11 DIAGNOSIS — L299 Pruritus, unspecified: Secondary | ICD-10-CM

## 2014-01-11 DIAGNOSIS — M6281 Muscle weakness (generalized): Secondary | ICD-10-CM | POA: Insufficient documentation

## 2014-01-11 DIAGNOSIS — Z881 Allergy status to other antibiotic agents status: Secondary | ICD-10-CM | POA: Insufficient documentation

## 2014-01-11 DIAGNOSIS — F3289 Other specified depressive episodes: Secondary | ICD-10-CM | POA: Insufficient documentation

## 2014-01-11 DIAGNOSIS — L719 Rosacea, unspecified: Secondary | ICD-10-CM

## 2014-01-11 DIAGNOSIS — Z9889 Other specified postprocedural states: Secondary | ICD-10-CM | POA: Insufficient documentation

## 2014-01-11 DIAGNOSIS — Z79899 Other long term (current) drug therapy: Secondary | ICD-10-CM | POA: Insufficient documentation

## 2014-01-11 DIAGNOSIS — G4730 Sleep apnea, unspecified: Secondary | ICD-10-CM | POA: Insufficient documentation

## 2014-01-11 DIAGNOSIS — R42 Dizziness and giddiness: Secondary | ICD-10-CM | POA: Insufficient documentation

## 2014-01-11 DIAGNOSIS — G4700 Insomnia, unspecified: Secondary | ICD-10-CM

## 2014-01-11 DIAGNOSIS — M79609 Pain in unspecified limb: Secondary | ICD-10-CM

## 2014-01-11 DIAGNOSIS — R5383 Other fatigue: Secondary | ICD-10-CM

## 2014-01-11 DIAGNOSIS — N83209 Unspecified ovarian cyst, unspecified side: Secondary | ICD-10-CM

## 2014-01-11 DIAGNOSIS — R5381 Other malaise: Secondary | ICD-10-CM

## 2014-01-11 DIAGNOSIS — Z886 Allergy status to analgesic agent status: Secondary | ICD-10-CM | POA: Insufficient documentation

## 2014-01-11 DIAGNOSIS — R011 Cardiac murmur, unspecified: Secondary | ICD-10-CM | POA: Insufficient documentation

## 2014-01-11 DIAGNOSIS — M76899 Other specified enthesopathies of unspecified lower limb, excluding foot: Secondary | ICD-10-CM

## 2014-01-11 DIAGNOSIS — J019 Acute sinusitis, unspecified: Secondary | ICD-10-CM

## 2014-01-11 DIAGNOSIS — J069 Acute upper respiratory infection, unspecified: Secondary | ICD-10-CM

## 2014-01-11 DIAGNOSIS — R7309 Other abnormal glucose: Secondary | ICD-10-CM

## 2014-01-11 DIAGNOSIS — F4321 Adjustment disorder with depressed mood: Secondary | ICD-10-CM | POA: Diagnosis present

## 2014-01-11 DIAGNOSIS — Z862 Personal history of diseases of the blood and blood-forming organs and certain disorders involving the immune mechanism: Secondary | ICD-10-CM | POA: Insufficient documentation

## 2014-01-11 DIAGNOSIS — N644 Mastodynia: Secondary | ICD-10-CM

## 2014-01-11 DIAGNOSIS — R0600 Dyspnea, unspecified: Secondary | ICD-10-CM | POA: Diagnosis present

## 2014-01-11 DIAGNOSIS — R110 Nausea: Secondary | ICD-10-CM

## 2014-01-11 DIAGNOSIS — K219 Gastro-esophageal reflux disease without esophagitis: Secondary | ICD-10-CM | POA: Insufficient documentation

## 2014-01-11 DIAGNOSIS — G8929 Other chronic pain: Secondary | ICD-10-CM | POA: Insufficient documentation

## 2014-01-11 DIAGNOSIS — R140 Abdominal distension (gaseous): Secondary | ICD-10-CM

## 2014-01-11 DIAGNOSIS — I10 Essential (primary) hypertension: Secondary | ICD-10-CM | POA: Insufficient documentation

## 2014-01-11 DIAGNOSIS — F988 Other specified behavioral and emotional disorders with onset usually occurring in childhood and adolescence: Secondary | ICD-10-CM

## 2014-01-11 DIAGNOSIS — Z9189 Other specified personal risk factors, not elsewhere classified: Secondary | ICD-10-CM

## 2014-01-11 DIAGNOSIS — Z885 Allergy status to narcotic agent status: Secondary | ICD-10-CM | POA: Insufficient documentation

## 2014-01-11 DIAGNOSIS — Z9089 Acquired absence of other organs: Secondary | ICD-10-CM | POA: Insufficient documentation

## 2014-01-11 DIAGNOSIS — M129 Arthropathy, unspecified: Secondary | ICD-10-CM | POA: Insufficient documentation

## 2014-01-11 DIAGNOSIS — E669 Obesity, unspecified: Secondary | ICD-10-CM | POA: Diagnosis present

## 2014-01-11 DIAGNOSIS — M25511 Pain in right shoulder: Secondary | ICD-10-CM

## 2014-01-11 DIAGNOSIS — M26609 Unspecified temporomandibular joint disorder, unspecified side: Secondary | ICD-10-CM

## 2014-01-11 DIAGNOSIS — E041 Nontoxic single thyroid nodule: Secondary | ICD-10-CM

## 2014-01-11 DIAGNOSIS — Z872 Personal history of diseases of the skin and subcutaneous tissue: Secondary | ICD-10-CM | POA: Insufficient documentation

## 2014-01-11 LAB — CBC
HCT: 37.6 % (ref 36.0–46.0)
Hemoglobin: 12.7 g/dL (ref 12.0–15.0)
MCH: 29.7 pg (ref 26.0–34.0)
MCHC: 33.8 g/dL (ref 30.0–36.0)
MCV: 87.9 fL (ref 78.0–100.0)
PLATELETS: 398 10*3/uL (ref 150–400)
RBC: 4.28 MIL/uL (ref 3.87–5.11)
RDW: 12.3 % (ref 11.5–15.5)
WBC: 9.5 10*3/uL (ref 4.0–10.5)

## 2014-01-11 LAB — COMPREHENSIVE METABOLIC PANEL
ALT: 33 U/L (ref 0–35)
AST: 59 U/L — AB (ref 0–37)
Albumin: 4 g/dL (ref 3.5–5.2)
Alkaline Phosphatase: 57 U/L (ref 39–117)
BUN: 10 mg/dL (ref 6–23)
CO2: 21 meq/L (ref 19–32)
CREATININE: 0.67 mg/dL (ref 0.50–1.10)
Calcium: 9.6 mg/dL (ref 8.4–10.5)
Chloride: 104 mEq/L (ref 96–112)
GFR calc Af Amer: >90 mL/min (ref >90)
GFR calc non Af Amer: >90 mL/min (ref >90)
Glucose, Bld: 104 mg/dL — ABNORMAL HIGH (ref 70–99)
Potassium: 6.4 mEq/L — ABNORMAL HIGH (ref 3.7–5.3)
SODIUM: 140 meq/L (ref 137–147)
TOTAL PROTEIN: 7.9 g/dL (ref 6.0–8.3)
Total Bilirubin: 0.3 mg/dL (ref 0.3–1.2)

## 2014-01-11 LAB — MAGNESIUM: Magnesium: 2 mg/dL (ref 1.5–2.5)

## 2014-01-11 LAB — URINALYSIS, ROUTINE W REFLEX MICROSCOPIC
BILIRUBIN URINE: NEGATIVE
GLUCOSE, UA: NEGATIVE mg/dL
Hgb urine dipstick: NEGATIVE
Ketones, ur: NEGATIVE mg/dL
Leukocytes, UA: NEGATIVE
Nitrite: NEGATIVE
Protein, ur: NEGATIVE mg/dL
SPECIFIC GRAVITY, URINE: 1.006 (ref 1.005–1.030)
Urobilinogen, UA: 0.2 mg/dL (ref 0.0–1.0)
pH: 6.5 (ref 5.0–8.0)

## 2014-01-11 LAB — BASIC METABOLIC PANEL
BUN: 10 mg/dL (ref 6–23)
CO2: 25 mEq/L (ref 19–32)
CREATININE: 0.81 mg/dL (ref 0.50–1.10)
Calcium: 10 mg/dL (ref 8.4–10.5)
Chloride: 100 mEq/L (ref 96–112)
GFR calc non Af Amer: 81 mL/min — ABNORMAL LOW (ref >90)
Glucose, Bld: 116 mg/dL — ABNORMAL HIGH (ref 70–99)
Potassium: 4.3 mEq/L (ref 3.7–5.3)
Sodium: 140 mEq/L (ref 137–147)

## 2014-01-11 LAB — POCT I-STAT TROPONIN I: TROPONIN I, POC: 0.01 ng/mL (ref 0.00–0.08)

## 2014-01-11 LAB — D-DIMER, QUANTITATIVE (NOT AT ARMC): D-Dimer, Quant: 0.28 ug/mL-FEU (ref 0.00–0.48)

## 2014-01-11 MED ORDER — SODIUM CHLORIDE 0.9 % IV BOLUS (SEPSIS)
1000.0000 mL | Freq: Once | INTRAVENOUS | Status: AC
  Administered 2014-01-11: 1000 mL via INTRAVENOUS

## 2014-01-11 MED ORDER — DIAZEPAM 5 MG PO TABS
10.0000 mg | ORAL_TABLET | Freq: Once | ORAL | Status: AC
  Administered 2014-01-11: 10 mg via ORAL
  Filled 2014-01-11: qty 2

## 2014-01-11 MED ORDER — DIPHENHYDRAMINE HCL 25 MG PO CAPS
50.0000 mg | ORAL_CAPSULE | Freq: Once | ORAL | Status: AC
  Administered 2014-01-11: 50 mg via ORAL
  Filled 2014-01-11: qty 2

## 2014-01-11 MED ORDER — ONDANSETRON HCL 4 MG/2ML IJ SOLN
4.0000 mg | Freq: Once | INTRAMUSCULAR | Status: AC
  Administered 2014-01-11: 4 mg via INTRAVENOUS
  Filled 2014-01-11: qty 2

## 2014-01-11 MED ORDER — DIAZEPAM 5 MG PO TABS
5.0000 mg | ORAL_TABLET | Freq: Once | ORAL | Status: AC
  Administered 2014-01-11: 5 mg via ORAL
  Filled 2014-01-11: qty 1

## 2014-01-11 MED ORDER — HYDROCORTISONE NA SUCCINATE PF 100 MG IJ SOLR
200.0000 mg | Freq: Once | INTRAMUSCULAR | Status: AC
  Administered 2014-01-11: 200 mg via INTRAVENOUS
  Filled 2014-01-11 (×2): qty 4

## 2014-01-11 NOTE — ED Notes (Addendum)
Pt. Had episode of "throat closing up". Pt. Breathing rapid and shallow, airway intact. Pt. Anxious and tearful. Helped back into bed. O2 100%.  Placed on 2L for patient comfort.

## 2014-01-11 NOTE — ED Notes (Signed)
Phlebotomy at the bedside drawing labs.

## 2014-01-11 NOTE — ED Notes (Signed)
Patient transported to X-ray 

## 2014-01-11 NOTE — ED Notes (Signed)
Lab at bedside

## 2014-01-11 NOTE — ED Provider Notes (Signed)
CSN: [id number]     Arrival date & time 01/11/14  1504 History   First MD Initiated Contact with Patient 01/11/14 1728     Chief Complaint  Patient presents with  . Shaking  . Shortness of Breath     (Consider location/radiation/quality/duration/timing/severity/associated sxs/prior Treatment) Patient is a 53 y.o. female presenting with shortness of breath. The history is provided by the patient.  Shortness of Breath Severity:  Moderate Onset quality:  Gradual Duration:  3 days Timing:  Constant Progression:  Unchanged Chronicity:  New Context: activity   Relieved by:  Nothing Worsened by:  Nothing tried Ineffective treatments:  None tried Associated symptoms: abdominal pain   Associated symptoms: no chest pain, no fever, no headaches, no hemoptysis, no neck pain, no swollen glands, no vomiting and no wheezing     53 year old female with the chief complaint of a tremor. Patient states it started a couple days ago after drinking a diet supplement seen on the Internet. Patient states that she mixed a lemon cayenne pepper in vinegar together. Patient says that after that she noted that she had a tremor at all times. Patient also with some epigastric abdominal pain after this which he feels like is a flare of her chronic abdominal pain. Patient with some diarrhea with this denies any blood. Patient with some weakness with this. Patient also states that she gets dizzy when she stands for prolonged period of time. Patient with some left-sided weakness that the patient feels like has gone on for some time. Patient states it gets worse when she sits on the toilet for a long time. Patient denies any chest pain patient denies any shortness breath. Patient feels that she's having spasm of the muscles on either side of her neck. Patient with prior neck surgery due to the bulging disc there. Patient also feels like her throat is closing on her. Patient has some spasm that she feels like happens and then  she is unable to breathe. States that she take some slow deep breaths and eventually this resolved. Patient denies this being the onset of anxiety. One episode noted while I was in the room. Patient with no noted stridor. Patient's symptoms resolved with slow deep breathing.  Past Medical History  Diagnosis Date  . Allergy   . Anemia   . Anxiety   . Depression   . GERD (gastroesophageal reflux disease)   . Heart murmur   . Hyperlipidemia   . Hypertension   . Thyroid disease     nodule  . Ulcer   . Diverticulosis   . Sleep apnea     on C-pap machine  . Arthritis   . Vertigo   . Hernia   . IBS (irritable bowel syndrome)   . Obesity    Past Surgical History  Procedure Laterality Date  . Cholecystectomy    . Cesarean section    . Cervical fusion    . Inguinal hernia repair    . Partial hysterectomy     Family History  Problem Relation Age of Onset  . Colon cancer Maternal Grandfather   . Hypertension Maternal Grandfather   . Cancer Maternal Grandfather     lung  . Hyperlipidemia Mother   . Hypertension Mother   . Hypertension Maternal Grandmother   . Hypertension Brother   . Hypertension Sister   . Cancer Maternal Aunt     lung   History  Substance Use Topics  . 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 Smokeless tobacco: Never Used  . Alcohol Use: Yes     Comment: rarely   OB History   Grav Para Term Preterm Abortions TAB SAB Ect Mult Living   3 2 2  1 1    2      Review of Systems  Constitutional: Negative for fever and chills.  HENT: Negative for congestion and rhinorrhea.   Eyes: Negative for redness and visual disturbance.  Respiratory: Positive for shortness of breath (with feeling of throat closing.). Negative for hemoptysis and wheezing.   Cardiovascular: Negative for chest pain and palpitations.  Gastrointestinal: Positive for abdominal pain. Negative for nausea, vomiting and diarrhea.  Genitourinary: Negative for dysuria and  urgency.  Musculoskeletal: Negative for arthralgias, myalgias and neck pain.  Skin: Negative for pallor and wound.  Neurological: Negative for dizziness and headaches.      Allergies  Aspirin; Hydrocodone-acetaminophen; Moxifloxacin; and Oxycodone  Home Medications   Current Outpatient Rx  Name  Route  Sig  Dispense  Refill  . LORazepam (ATIVAN) 1 MG tablet   Oral   Take 1 tablet (1 mg total) by mouth every 6 (six) hours as needed for anxiety (tremors).   10 tablet   0    BP 146/80  Pulse 82  Temp(Src) 98.7 F (37.1 C)  Resp 20  SpO2 96% Physical Exam  Constitutional: She is oriented to person, place, and time. She appears well-developed and well-nourished. No distress.  HENT:  Head: Normocephalic and atraumatic.  Cystic mass noted to the left of midline along her C-spine incision.   Eyes: EOM are normal. Pupils are equal, round, and reactive to light.  Neck: Normal range of motion. Neck supple.  Cardiovascular: Normal rate and regular rhythm.  Exam reveals no gallop and no friction rub.   No murmur heard. Pulmonary/Chest: Effort normal. She has no wheezes. She has no rales.  Abdominal: Soft. She exhibits no distension. There is no tenderness.  Musculoskeletal: She exhibits no edema and no tenderness.  Neurological: She is alert and oriented to person, place, and time.  Noted fine intention tremor. Left lower extremity 4/5. Right lower extremity 5 out of 5. Decreased reflex left lower extremity. Increased reflux right lower extremity.  Skin: Skin is warm and dry. She is not diaphoretic.  Psychiatric: She has a normal mood and affect. Her behavior is normal.    ED Course  Procedures (including critical care time) Labs Review Labs Reviewed  COMPREHENSIVE METABOLIC PANEL - Abnormal; Notable for the following:    Potassium 6.4 (*)    Glucose, Bld 104 (*)    AST 59 (*)    All other components within normal limits  CBC  URINALYSIS, ROUTINE W REFLEX MICROSCOPIC   BASIC METABOLIC PANEL  POCT I-STAT TROPONIN I   Imaging Review Dg Chest 2 View  01/11/2014   CLINICAL DATA:  Shortness of breath  EXAM: CHEST  2 VIEW  COMPARISON:  04/27/2013  FINDINGS: The heart size and mediastinal contours are within normal limits. Both lungs are clear. The visualized skeletal structures are unremarkable.  IMPRESSION: No active cardiopulmonary disease.   Electronically Signed   By: Inez Catalina M.D.   On: 01/11/2014 16:08    EKG Interpretation   None       MDM   Final diagnoses:  None     53 year old female with a past medical history of anxiety depression hypertension hyperlipidemia and irritable bowel syndrome who presents with tremor.  Patient with elevated potassium  on initial CMP. This was associated with hemolysis. Repeat BMP was unremarkable.  Concern with left-sided leg weakness worsening over the past month. We will obtain any plain film to rule out fracture of the L-spine. Patient with continued tremor. We will obtain a CT scan with contrast of the C-spine to evaluate possible soft tissue area, as well as the C-spine itself.  Contacted hospitalist for admission. Would like neurology to weigh in on the numbness, weakness. Neurology contacted.  Deno Etienne, MD 01/11/14 [phone]

## 2014-01-11 NOTE — ED Notes (Addendum)
Was seen on 2/7 and 2/9 at [facility] for same  Shaking and sob woke up this am feeling the same way states was given meds for anxiety but did not take it today still having nausea but not diarrhea like before

## 2014-01-11 NOTE — ED Notes (Signed)
Contacted lab and spoke with sheila, labs are being processed at this time.

## 2014-01-11 NOTE — ED Notes (Signed)
Patient transported to CT 

## 2014-01-11 NOTE — ED Notes (Signed)
Lab unable to do add on for TSH, T4, free, Magnesium, and D-dimer. EDP [place] notified.

## 2014-01-11 NOTE — ED Notes (Signed)
Ct notified pt given prophylactic medications for contrast allergy.

## 2014-01-12 ENCOUNTER — Ambulatory Visit: Payer: BC Managed Care – PPO | Admitting: Internal Medicine

## 2014-01-12 ENCOUNTER — Encounter (HOSPITAL_COMMUNITY): Payer: Self-pay | Admitting: Radiology

## 2014-01-12 ENCOUNTER — Emergency Department (HOSPITAL_COMMUNITY): Payer: BC Managed Care – PPO

## 2014-01-12 DIAGNOSIS — F411 Generalized anxiety disorder: Secondary | ICD-10-CM

## 2014-01-12 DIAGNOSIS — F459 Somatoform disorder, unspecified: Secondary | ICD-10-CM

## 2014-01-12 DIAGNOSIS — R251 Tremor, unspecified: Secondary | ICD-10-CM

## 2014-01-12 DIAGNOSIS — M542 Cervicalgia: Secondary | ICD-10-CM

## 2014-01-12 DIAGNOSIS — R0600 Dyspnea, unspecified: Secondary | ICD-10-CM | POA: Diagnosis present

## 2014-01-12 DIAGNOSIS — R259 Unspecified abnormal involuntary movements: Secondary | ICD-10-CM

## 2014-01-12 LAB — AMMONIA: AMMONIA: 24 umol/L (ref 11–60)

## 2014-01-12 LAB — TSH
TSH: 2.158 u[IU]/mL (ref 0.350–4.500)
TSH: 1.056 u[IU]/mL (ref 0.350–4.500)

## 2014-01-12 LAB — PRO B NATRIURETIC PEPTIDE: Pro B Natriuretic peptide (BNP): 23.6 pg/mL (ref 0–125)

## 2014-01-12 LAB — SEDIMENTATION RATE: Sed Rate: 15 mm/hr (ref 0–22)

## 2014-01-12 LAB — T4, FREE: Free T4: 1.04 ng/dL (ref 0.80–1.80)

## 2014-01-12 LAB — VITAMIN B12: VITAMIN B 12: 388 pg/mL (ref 211–911)

## 2014-01-12 LAB — LIPASE, BLOOD: Lipase: 22 U/L (ref 11–59)

## 2014-01-12 LAB — CK: Total CK: 52 U/L (ref 7–177)

## 2014-01-12 MED ORDER — LORAZEPAM 1 MG PO TABS
1.0000 mg | ORAL_TABLET | ORAL | Status: DC | PRN
  Administered 2014-01-12 (×2): 1 mg via ORAL
  Filled 2014-01-12 (×2): qty 1

## 2014-01-12 MED ORDER — IOHEXOL 300 MG/ML  SOLN
75.0000 mL | Freq: Once | INTRAMUSCULAR | Status: AC | PRN
  Administered 2014-01-12: 75 mL via INTRAVENOUS

## 2014-01-12 MED ORDER — PROPRANOLOL HCL 40 MG PO TABS
40.0000 mg | ORAL_TABLET | Freq: Two times a day (BID) | ORAL | Status: DC
  Administered 2014-01-12: 40 mg via ORAL
  Filled 2014-01-12 (×4): qty 1

## 2014-01-12 MED ORDER — ONDANSETRON HCL 4 MG PO TABS
4.0000 mg | ORAL_TABLET | Freq: Once | ORAL | Status: AC
  Administered 2014-01-12: 4 mg via ORAL
  Filled 2014-01-12: qty 1

## 2014-01-12 MED ORDER — PANTOPRAZOLE SODIUM 40 MG PO TBEC
40.0000 mg | DELAYED_RELEASE_TABLET | Freq: Every day | ORAL | Status: DC
  Administered 2014-01-12 – 2014-01-13 (×2): 40 mg via ORAL
  Filled 2014-01-12 (×2): qty 1

## 2014-01-12 MED ORDER — ACETAMINOPHEN 500 MG PO TABS
1000.0000 mg | ORAL_TABLET | Freq: Three times a day (TID) | ORAL | Status: DC | PRN
  Administered 2014-01-12 – 2014-01-13 (×2): 1000 mg via ORAL
  Filled 2014-01-12 (×2): qty 2

## 2014-01-12 MED ORDER — CYCLOBENZAPRINE HCL 5 MG PO TABS
7.5000 mg | ORAL_TABLET | Freq: Three times a day (TID) | ORAL | Status: DC | PRN
  Administered 2014-01-12: 7.5 mg via ORAL
  Filled 2014-01-12: qty 1.5

## 2014-01-12 MED ORDER — ENOXAPARIN SODIUM 60 MG/0.6ML ~~LOC~~ SOLN
50.0000 mg | SUBCUTANEOUS | Status: DC
  Administered 2014-01-12: 50 mg via SUBCUTANEOUS
  Filled 2014-01-12 (×3): qty 0.6

## 2014-01-12 MED ORDER — VITAMIN B-12 1000 MCG PO TABS
1000.0000 ug | ORAL_TABLET | Freq: Every day | ORAL | Status: DC
  Administered 2014-01-13: 1000 ug via ORAL
  Filled 2014-01-12: qty 1

## 2014-01-12 MED ORDER — SODIUM CHLORIDE 0.9 % IV SOLN
INTRAVENOUS | Status: DC
  Administered 2014-01-12 – 2014-01-13 (×4): via INTRAVENOUS

## 2014-01-12 MED ORDER — IPRATROPIUM-ALBUTEROL 0.5-2.5 (3) MG/3ML IN SOLN: 3.0000 mL | RESPIRATORY_TRACT | Status: DC | PRN

## 2014-01-12 NOTE — Progress Notes (Signed)
ANTICOAGULATION CONSULT NOTE - Initial Consult  Pharmacy Consult for enoxaparin Indication: VTE prophylaxis  Allergies  Allergen Reactions  . Aspirin Other (See Comments)    Has history of ulcers   . Hydrocodone-Acetaminophen Nausea And Vomiting  . Moxifloxacin Itching  . Oxycodone Nausea And Vomiting    Patient Measurements: Height: 5\' 4"  (162.6 cm) Weight: 234 lb 4.8 oz (106.278 kg) IBW/kg (Calculated) : 54.7  Vital Signs: Temp: 98.9 F (37.2 C) (02/11 1419) Temp src: Oral (02/11 1419) BP: 125/79 mmHg (02/11 1419) Pulse Rate: 75 (02/11 1419)  Labs:  Recent Labs  01/11/14 1515 01/11/14 1831 01/12/14 0335  HGB 12.7  --   --   HCT 37.6  --   --   PLT 398  --   --   CREATININE 0.67 0.81  --   CKTOTAL  --   --  52    Estimated Creatinine Clearance: 95.5 ml/min (by C-G formula based on Cr of 0.81).   Medical History: Past Medical History  Diagnosis Date  . Allergy   . Anemia   . Anxiety   . Depression   . GERD (gastroesophageal reflux disease)   . Heart murmur   . Hyperlipidemia   . Hypertension   . Thyroid disease     nodule  . Ulcer   . Diverticulosis   . Sleep apnea     on C-pap machine  . Arthritis   . Vertigo   . Hernia   . IBS (irritable bowel syndrome)   . Obesity     Medications:  Facility-administered medications prior to admission  Medication Dose Route Frequency Provider Last Rate Last Dose  . methylPREDNISolone acetate (DEPO-MEDROL) injection 40 mg  40 mg Intra-articular Once Cassandria Anger, MD       No prescriptions prior to admission    Assessment: Pharmacy consulted to dose lovenox for vte prophylaxis.  Will initiate 0.5 mg/kg due to weight > 100 kg and BMI > 50.  Platelets, hemoglobin stable.  Goal of Therapy:  Monitor platelets by anticoagulation protocol: Yes   Plan:  Lovenox 50 mg [place] q24h No dose adjustments expected, pharmacy will sign off, please re-consult if needed.  Hughes Better, PharmD, BCPS 01/12/2014  5:35 PM

## 2014-01-12 NOTE — H&P (Signed)
[facility] Admission History and Physical  Patient name: Paula [name] Medical record number: [id number] Date of birth: 1961/07/21 Age: 53 y.o. Gender: female  Primary Care Provider: [name], MD  Chief Complaint: tremors, weakness, dyspnea   History of Present Illness:This is a 53 year old female with no reported prior medical history presenting with progressive tremor, weakness, dyspnea. (Preliminary H&P as Epic is down currently. Unable to obtain full history apart from patient's report.) Patient states that she's had persistent neck tremors as well as generalized shortness of breath since 3-4 days ago. No recent trauma. No infections. No chest pain. Patient states that she went to a local urgent care with presentation of symptoms. Workup was otherwise negative with recommendation the patient go to the ER for further evaluation. Patient states that the wait was too long so she went home. Patient states that she went back to the urgent care the next day and then was referred back to the E.R. In the ER thus far, workup has been essentially negative. Blood pressure stable. Lab work within normal limits. Predominantly, patient has been unable to ambulate with severe weakness. Weakness is mainly in the left side/left leg. Mild paresthesias. Patient does report she's been having generalized weakness over the past 6 months. No weight loss or weight gain. Patient has been under high amount of stress. Denies any significant medical problems in the past however does report having neck surgery several years ago. No radicular symptoms down the arms. Next CT in the emergency room is currently pending.    Patient Active Problem List   Diagnosis Date Noted  . Breast pain in female 02/09/2013  . URI, acute 12/25/2012  . Elevated WBC count 10/12/2012  . Abdominal  pain, other specified site 10/09/2012  . Abdominal bloating 10/09/2012  . Ovarian cystic mass 05/25/2012  . Shoulder pain, right  03/11/2012  . Right arm pain 12/30/2011  . SINUSITIS, ACUTE 07/30/2010  . ACNE, ROSACEA 07/17/2010  . LEG PAIN 07/17/2010  . NECK PAIN 05/10/2010  . ABDOMINAL PAIN, EPIGASTRIC 05/10/2010  . BURSITIS, LEFT HIP 05/01/2010  . PLANTAR FASCIITIS, BILATERAL 05/01/2010  . HYPERGLYCEMIA 01/05/2010  . PRURITUS 11/27/2009  . TOBACCO USE, QUIT 11/27/2009  . ANXIETY 11/22/2008  . FATIGUE 11/22/2008  . HYPERTENSION 10/07/2008  . Insomnia, Unspecified 08/30/2008  . OBESITY, MORBID 08/19/2008  . NAUSEA ALONE 05/02/2008  . VERTIGO 01/19/2008  . DIARRHEA 01/19/2008  . THYROID NODULE 06/23/2007  . HYPERLIPIDEMIA 06/23/2007  . DEPRESSION 06/23/2007  . ADD 06/23/2007  . TMJ SYNDROME 06/23/2007  . GERD 06/23/2007  . Irritable bowel syndrome 06/23/2007  . HEADACHE 06/23/2007  . INSOMNIA, HX OF 06/23/2007   Past Medical History: Past Medical History  Diagnosis Date  . Allergy   . Anemia   . Anxiety   . Depression   . GERD (gastroesophageal reflux disease)   . Heart murmur   . Hyperlipidemia   . Hypertension   . Thyroid disease     nodule  . Ulcer   . Diverticulosis   . Sleep apnea     on C-pap machine  . Arthritis   . Vertigo   . Hernia   . IBS (irritable bowel syndrome)   . Obesity     Past Surgical History: Past Surgical History  Procedure Laterality Date  . Cholecystectomy    . Cesarean section    . Cervical fusion    . Inguinal hernia repair    . Partial hysterectomy      Social History:  History   Social History  . Marital Status: Married    Spouse Name: N/A    Number of Children: N/A  . Years of Education: N/A   Social History Main Topics  . Smoking status: Former Smoker    Quit date: 07/24/2008  . Smokeless tobacco: Never Used  . Alcohol Use: Yes     Comment: rarely  . Drug Use: No  . Sexual Activity: Yes    Birth Control/ Protection: Surgical   Other Topics Concern  . None   Social History Narrative  . None    Family History: Family History   Problem Relation Age of Onset  . Colon cancer Maternal Grandfather   . Hypertension Maternal Grandfather   . Cancer Maternal Grandfather     lung  . Hyperlipidemia Mother   . Hypertension Mother   . Hypertension Maternal Grandmother   . Hypertension Brother   . Hypertension Sister   . Cancer Maternal Aunt     lung    Allergies: Allergies  Allergen Reactions  . Aspirin Other (See Comments)    Has history of ulcers   . Hydrocodone-Acetaminophen Nausea And Vomiting  . Moxifloxacin Itching  . Oxycodone Nausea And Vomiting    Current Facility-Administered Medications  Medication Dose Route Frequency Provider Last Rate Last Dose  . methylPREDNISolone acetate (DEPO-MEDROL) injection 40 mg  40 mg Intra-articular Once Cassandria Anger, MD       No current outpatient prescriptions on file.   Review Of Systems: 12 point ROS negative except as noted above in HPI.  Physical Exam: Filed Vitals:   01/11/14 2200  BP: 145/67  Pulse: 71  Temp:   Resp: 12    General: In bed, positive head and neck tremors Head, eyes, ears, neck, throat: Within normal limits. Cardiovascular: Regular rate and rhythm no rubs gallops or murmurs Pulmonary: Clear to auscultation bilaterally Abdomen: Soft, nontender, positive bowel sounds Extremities: Generalized weakness in the upper and lower shin these diffusely, most predominant in the left lower extremity.  Labs and Imaging: Lab Results  Component Value Date/Time   NA 140 01/11/2014  6:31 PM   K 4.3 01/11/2014  6:31 PM   CL 100 01/11/2014  6:31 PM   CO2 25 01/11/2014  6:31 PM   BUN 10 01/11/2014  6:31 PM   CREATININE 0.81 01/11/2014  6:31 PM   GLUCOSE 116* 01/11/2014  6:31 PM   Lab Results  Component Value Date   WBC 9.5 01/11/2014   HGB 12.7 01/11/2014   HCT 37.6 01/11/2014   MCV 87.9 01/11/2014   PLT 398 01/11/2014    Dg Chest 2 View  01/11/2014   CLINICAL DATA:  Shortness of breath  EXAM: CHEST  2 VIEW  COMPARISON:  04/27/2013  FINDINGS:  The heart size and mediastinal contours are within normal limits. Both lungs are clear. The visualized skeletal structures are unremarkable.  IMPRESSION: No active cardiopulmonary disease.   Electronically Signed   By: Inez Catalina M.D.   On: 01/11/2014 16:08   Dg Lumbar Spine Complete  01/11/2014   CLINICAL DATA:  Low back pain for several months.  EXAM: LUMBAR SPINE - COMPLETE 4+ VIEW  COMPARISON:  None.  FINDINGS: There is no evidence of lumbar spine fracture. Alignment is normal. Intervertebral disc spaces are maintained.  IMPRESSION: Negative exam.   Electronically Signed   By: Inge Rise M.D.   On: 01/11/2014 20:37     Assessment and Plan: 53 year old female with progressive tremors and  dyspnea  Tremors: Very broad differential for her symptoms including psychological, metabolic, autoimmune, inflammatory sources. Neurology has been consultative. Formal recommendations pending. Status post Solu-Cortef in the ER as well as Ativan. This did seem to improve symptoms. We'll check baseline labs including TSH, vitamin B12, vitamin D, CMP, CK level, sedimentation rate. May consider putting her on a muscle relaxant like Flexeril pending workup in neurology recommendations.   Dyspnea: Stable from a respiratory standpoint currently. No increased work of breathing. No hypoxia or tachycardia. D-dimer within normal limits on lab work prior to [employer] going down. Patient is a nonsmoker. Chest x-ray also preliminarily within normal limits. Anxiety may be playing a component. Will cycle cardiac enzymes. Supplemental O2 prn. Also obtain 2D ECHO. Check pro BNP.   FEN/GI: NS @ [facility]. PPx: sub q heparin Dispo: pending further evaluation. Code Status: Full Code        Shanda Howells MD  Pager: [phone]

## 2014-01-12 NOTE — Consult Note (Addendum)
NEURO HOSPITALIST CONSULT NOTE    Reason for Consult: head tremors, new onset  HPI:                                                                                                                                          Paula [name] is an 53 y.o. female, right handed, with a pasty medical history significant for HTN, hyperlipidemia, OSA on CPAP, thyroid disease, GERD, IBS, s/p cervical fusion, depression, anxiety, comes in complaining of tremors. She said that the tremors started couple of days ago after drinking a diet supplement seen on the Internet. Patient states that she mixed a lemon cayenne pepper in vinegar together and subsequently developed " a rocking head tremor up and down and right to left that doesn't stop and I can not control". [name] tells me that she never had any type of tremors or other abnormal movements before. She said that the tremor is present all the time but family said it goes away when she falls asleep. She complains of " painful spasms in my neck and shoulders" that worsen when she gets up and walk. As a matter of fact, patient expressed taht walking provokes " a suffocating sensation in my neck". Nothing can relieve or induced the tremor. Denies voice, jaw, chin, hands, trunk, or legs tremor. No recent neck trauma. No exposure to new medications, in particular dopamine antagonists. No fever or infection. On the other hand, she complains of HA and dizziness as well as chronic low back pain and weakness of the left lower extremity. Unenhanced CT head unremarkable.       Past Medical History  Diagnosis Date  . Allergy   . Anemia   . Anxiety   . Depression   . GERD (gastroesophageal reflux disease)   . Heart murmur   . Hyperlipidemia   . Hypertension   . Thyroid disease     nodule  . Ulcer   . Diverticulosis   . Sleep apnea     on C-pap machine  . Arthritis   . Vertigo   . Hernia   . IBS (irritable bowel syndrome)   .  Obesity     Past Surgical History  Procedure Laterality Date  . Cholecystectomy    . Cesarean section    . Cervical fusion    . Inguinal hernia repair    . Partial hysterectomy      Family History  Problem Relation Age of Onset  . Colon cancer Maternal Grandfather   . Hypertension Maternal Grandfather   . Cancer Maternal Grandfather     lung  . Hyperlipidemia Mother   . Hypertension Mother   . Hypertension Maternal Grandmother   . Hypertension Brother   . Hypertension Sister   . Cancer  Maternal Aunt     lung     Social History:  reports that she quit smoking about 5 years ago. She has never used smokeless tobacco. She reports that she drinks alcohol. She reports that she does not use illicit drugs.  Allergies  Allergen Reactions  . Aspirin Other (See Comments)    Has history of ulcers   . Hydrocodone-Acetaminophen Nausea And Vomiting  . Moxifloxacin Itching  . Oxycodone Nausea And Vomiting    MEDICATIONS:                                                                                                                     I have reviewed the patient's current medications.   ROS:                                                                                                                                       History obtained from the patient and chart review.  General ROS: negative for - chills, fatigue, fever, night sweats, or weight loss Psychological ROS: negative for - behavioral disorder, hallucinations, memory difficulties, or suicidal ideation Ophthalmic ROS: negative for - blurry vision, double vision, eye pain or loss of vision ENT ROS: negative for - epistaxis, nasal discharge, oral lesions, sore throat, or tinnitus Allergy and Immunology ROS: negative for - hives or itchy/watery eyes Hematological and Lymphatic ROS: negative for - bleeding problems, bruising or swollen lymph nodes Endocrine ROS: negative for - galactorrhea, hair pattern changes,  polydipsia/polyuria or temperature intolerance Respiratory ROS: negative for - cough, hemoptysis, shortness of breath or wheezing Cardiovascular ROS: negative for - chest pain, dyspnea on exertion, edema or irregular heartbeat Gastrointestinal ROS: significant for - abdominal pain, diarrhea but no hematemesis, nausea/vomiting or stool incontinence Genito-Urinary ROS: negative for - dysuria, hematuria, incontinence or urinary frequency/urgency Musculoskeletal ROS: negative for - joint swelling or muscular weakness Neurological ROS: as noted in HPI Dermatological ROS: negative for rash and skin lesion changes   Physical exam: pleasant female in no apparent distress. Blood pressure 145/67, pulse 71, temperature 98.7 F (37.1 C), resp. rate 12, SpO2 99.00%. Head: normocephalic. Neck: supple, no bruits, no JVD. Cardiac: no murmurs. Lungs: clear. Abdomen: soft, no tender, no mass. Extremities: no edema.  Neurologic Examination:  Mental Status: Alert, oriented, thought content appropriate.  Speech fluent without evidence of aphasia.  Able to follow 3 step commands without difficulty. Cranial Nerves: II: Discs flat bilaterally; Visual fields grossly normal, pupils equal, round, reactive to light and accommodation III,IV, VI: ptosis not present, extra-ocular motions intact bilaterally V,VII: smile symmetric, facial light touch sensation normal bilaterally VIII: hearing normal bilaterally IX,X: gag reflex present XI: bilateral shoulder shrug XII: midline tongue extension without atrophy or fasciculations  Motor: Right : Upper extremity   5/5    Left:     Upper extremity   5/5  Lower extremity   5/5     Lower extremity   5/5 Tone and bulk:normal tone throughout; no atrophy noted Sensory: Pinprick and light touch intact throughout, bilaterally Deep Tendon Reflexes:  Right: Upper Extremity    Left: Upper extremity   biceps (C-5 to C-6) 2/4   biceps (C-5 to C-6) 2/4 tricep (C7) 2/4    triceps (C7) 2/4 Brachioradialis (C6) 2/4  Brachioradialis (C6) 2/4  Lower Extremity Lower Extremity  quadriceps (L-2 to L-4) 2/4   quadriceps (L-2 to L-4) 2/4 Achilles (S1) 2/4   Achilles (S1) 2/4  Plantars: Right: downgoing   Left: downgoing Coordination normal finger-to-nose,  normal heel-to-shin test in the let, couldn't test in the right LE due to pain. Head tremor Gait:  No tested. CV: pulses palpable throughout    Lab Results  Component Value Date/Time   CHOL 261* 01/08/2010  9:43 AM    Results for orders placed during the hospital encounter of 01/11/14 (from the past 48 hour(s))  CBC     Status: None   Collection Time    01/11/14  3:15 PM      Result Value Ref Range   WBC 9.5  4.0 - 10.5 K/uL   RBC 4.28  3.87 - 5.11 MIL/uL   Hemoglobin 12.7  12.0 - 15.0 g/dL   HCT 37.6  36.0 - 46.0 %   MCV 87.9  78.0 - 100.0 fL   MCH 29.7  26.0 - 34.0 pg   MCHC 33.8  30.0 - 36.0 g/dL   RDW 12.3  11.5 - 15.5 %   Platelets 398  150 - 400 K/uL  COMPREHENSIVE METABOLIC PANEL     Status: Abnormal   Collection Time    01/11/14  3:15 PM      Result Value Ref Range   Sodium 140  137 - 147 mEq/L   Potassium 6.4 (*) 3.7 - 5.3 mEq/L   Comment: HEMOLYSIS AT THIS LEVEL MAY AFFECT RESULT   Chloride 104  96 - 112 mEq/L   CO2 21  19 - 32 mEq/L   Glucose, Bld 104 (*) 70 - 99 mg/dL   BUN 10  6 - 23 mg/dL   Creatinine, Ser 0.67  0.50 - 1.10 mg/dL   Calcium 9.6  8.4 - 10.5 mg/dL   Total Protein 7.9  6.0 - 8.3 g/dL   Albumin 4.0  3.5 - 5.2 g/dL   AST 59 (*) 0 - 37 U/L   Comment: HEMOLYSIS AT THIS LEVEL MAY AFFECT RESULT   ALT 33  0 - 35 U/L   Comment: HEMOLYSIS AT THIS LEVEL MAY AFFECT RESULT   Alkaline Phosphatase 57  39 - 117 U/L   Comment: HEMOLYSIS AT THIS LEVEL MAY AFFECT RESULT   Total Bilirubin 0.3  0.3 - 1.2 mg/dL   GFR calc non Af Amer >90  >90 mL/min   GFR calc Af Amer >  90  >90  mL/min   Comment: (NOTE)     The eGFR has been calculated using the CKD EPI equation.     This calculation has not been validated in all clinical situations.     eGFR's persistently <90 mL/min signify possible Chronic Kidney     Disease.  POCT I-STAT TROPONIN I     Status: None   Collection Time    01/11/14  4:44 PM      Result Value Ref Range   Troponin i, poc 0.01  0.00 - 0.08 ng/mL   Comment 3            Comment: Due to the release kinetics of cTnI,     a negative result within the first hours     of the onset of symptoms does not rule out     myocardial infarction with certainty.     If myocardial infarction is still suspected,     repeat the test at appropriate intervals.  BASIC METABOLIC PANEL     Status: Abnormal   Collection Time    01/11/14  6:31 PM      Result Value Ref Range   Sodium 140  137 - 147 mEq/L   Potassium 4.3  3.7 - 5.3 mEq/L   Chloride 100  96 - 112 mEq/L   CO2 25  19 - 32 mEq/L   Glucose, Bld 116 (*) 70 - 99 mg/dL   BUN 10  6 - 23 mg/dL   Creatinine, Ser 0.81  0.50 - 1.10 mg/dL   Calcium 10.0  8.4 - 10.5 mg/dL   GFR calc non Af Amer 81 (*) >90 mL/min   GFR calc Af Amer >90  >90 mL/min   Comment: (NOTE)     The eGFR has been calculated using the CKD EPI equation.     This calculation has not been validated in all clinical situations.     eGFR's persistently <90 mL/min signify possible Chronic Kidney     Disease.  URINALYSIS, ROUTINE W REFLEX MICROSCOPIC     Status: None   Collection Time    01/11/14  8:24 PM      Result Value Ref Range   Color, Urine YELLOW  YELLOW   APPearance CLEAR  CLEAR   Specific Gravity, Urine 1.006  1.005 - 1.030   pH 6.5  5.0 - 8.0   Glucose, UA NEGATIVE  NEGATIVE mg/dL   Hgb urine dipstick NEGATIVE  NEGATIVE   Bilirubin Urine NEGATIVE  NEGATIVE   Ketones, ur NEGATIVE  NEGATIVE mg/dL   Protein, ur NEGATIVE  NEGATIVE mg/dL   Urobilinogen, UA 0.2  0.0 - 1.0 mg/dL   Nitrite NEGATIVE  NEGATIVE   Leukocytes, UA NEGATIVE   NEGATIVE   Comment: MICROSCOPIC NOT DONE ON URINES WITH NEGATIVE PROTEIN, BLOOD, LEUKOCYTES, NITRITE, OR GLUCOSE <1000 mg/dL.  MAGNESIUM     Status: None   Collection Time    01/11/14  9:38 PM      Result Value Ref Range   Magnesium 2.0  1.5 - 2.5 mg/dL  D-DIMER, QUANTITATIVE     Status: None   Collection Time    01/11/14  9:38 PM      Result Value Ref Range   D-Dimer, Quant 0.28  0.00 - 0.48 ug/mL-FEU   Comment:            AT THE INHOUSE ESTABLISHED CUTOFF     VALUE OF 0.48 ug/mL FEU,     THIS ASSAY HAS  BEEN DOCUMENTED     IN THE LITERATURE TO HAVE     A SENSITIVITY AND NEGATIVE     PREDICTIVE VALUE OF AT LEAST     98 TO 99%.  THE TEST RESULT     SHOULD BE CORRELATED WITH     AN ASSESSMENT OF THE CLINICAL     PROBABILITY OF DVT / VTE.    Dg Chest 2 View  01/11/2014   CLINICAL DATA:  Shortness of breath  EXAM: CHEST  2 VIEW  COMPARISON:  04/27/2013  FINDINGS: The heart size and mediastinal contours are within normal limits. Both lungs are clear. The visualized skeletal structures are unremarkable.  IMPRESSION: No active cardiopulmonary disease.   Electronically Signed   By: Inez Catalina M.D.   On: 01/11/2014 16:08   Dg Lumbar Spine Complete  01/11/2014   CLINICAL DATA:  Low back pain for several months.  EXAM: LUMBAR SPINE - COMPLETE 4+ VIEW  COMPARISON:  None.  FINDINGS: There is no evidence of lumbar spine fracture. Alignment is normal. Intervertebral disc spaces are maintained.  IMPRESSION: Negative exam.   Electronically Signed   By: Inge Rise M.D.   On: 01/11/2014 20:37    Assessment/Plan: 53 y/o with new onset head tremor but otherwise unimpressive neuro-exam and unremarkable CT brain. Differential includes isolated head tremor, subtle primary cervical dystonia with concomitantly associated dystonic tremor ( she complains of painful neck spasms but no evidence of anterocollis /retrocollis/laterocollis/ torticollis or geste antagoniste on exam). Can not entirely exclude  new onset psychogenic tremor. If she has a dystonic tremor, then will require Botox treatment as outpatient. Trial of Propranolol or gabapentin in case we are dealing with a primary tremor with ET-like characteristics Will follow up.    Dorian Pod, MD 01/12/2014, 2:04 AM

## 2014-01-12 NOTE — Progress Notes (Signed)
UR completed 

## 2014-01-12 NOTE — Progress Notes (Signed)
Placed patient on CPAP auto 20 cm/4cm. Patient tolerating well at this time.

## 2014-01-12 NOTE — ED Provider Notes (Signed)
I saw and evaluated the patient, reviewed the resident's note and I agree with the findings and plan.   Date: 01/11/2014  Rate: 62  Rhythm: normal sinus rhythm  QRS Axis: normal  Intervals: QT prolonged  ST/T Wave abnormalities: nonspecific T wave changes  Conduction Disutrbances:none  Narrative Interpretation: Diffuse T wave abnormalities. No ST elevation or depression  Old EKG Reviewed: changes noted     Patient with multiple atypical complaints. Primary seems to be dyspnea and near syncope, worse with walking. There also seems to be an anxiety component. However, she does have abnormal EKG, including prolonged QT. Due to this will need admission for telemetry and near-syncope/syncope w/u. As for her "weakness" she has LLE weakness that's been progressive for 1 month. 4/5 strength in ED. Will get CT of neck given her neck spasms and pain. MIld lower back tenderness with normal Xray.   Ephraim Hamburger, MD 01/12/14 1100

## 2014-01-12 NOTE — Progress Notes (Signed)
[facility] PROGRESS NOTE  DEMARIA DEENEY ASN:[id number] DOB: [birth year] DOA: 01/11/2014 PCP: [name], MD  Assessment/Plan  Tremor, ddx includes dystonic tremor, psychogenic tremor, or essential tremor, although sudden onset suggests latter is probably less likely.  Ativan appeared to help symptoms in ER.   -  CT head and neck negative for etiology to explain tremor -  Start propranolol per neurology -  Trial of ativan  -  TSH wnl -  ESR 15 -  CPK 52 -  Vit D pending  Dyspnea, CXR clear, D-dimer negative, BNP negative.  DDx includes vocal cord dysfunction, but based on exam, this may also be psychogenic.  She breaths normally until she is asked questions about her breathing or asked to take deep breaths, at which time she suddenly develops stuttering inspiratory stridor -  F/u ECHO -  Walking pulse ox -  Trial of prn duonebs  Generalized weakness -  B12 388, start oral B12 to get level > 400 -  TSH and ESR wnl -  PT eval -  Ammonia 24  Depression/anxiety, at a minimum this is confounding patient's presenting symptoms -  Consider starting SSRI -  Will need close outpatient psychiatry follow up  HTN/HLD, diet controlled  Obesity,  -  Encourage weight loss  Headache and dizziness  -  CT head negative for stroke -  Consider MRI brain to rule out stroke but defer utility to neurology  Small prevertebral effusion likely due to URI with symmetric mild bilateral level 2 lymphadenopathy -  Symptomatic care  Diet:  Healthy heart Access:  PIV IVF:  yes Proph:  lovenox   Code Status: full Family Communication:  Patient alone Disposition Plan: likely home tomorrow   Consultants:  Neurology  Procedures:  CXR  Lumbar spine XR  CT head  CT soft tissue neck  Antibiotics:  None  HPI/Subjective:  Persistent nausea, tremors of the head, weakness, and SOB.  No improvement since admission  Objective: Filed Vitals:   01/11/14 2200 01/12/14 0232  01/12/14 0537 01/12/14 1419  BP: 145/67 155/92 121/60 125/79  Pulse: 71 66 74 75  Temp:  98.4 F (36.9 C) 97.8 F (36.6 C) 98.9 F (37.2 C)  TempSrc:  Oral Oral Oral  Resp: $Remo'12 20  20  'MZRtv$ Height:  $Remove'5\' 4"'vEQWyeY$  (1.626 m)    Weight:  106.278 kg (234 lb 4.8 oz)    SpO2: 99% 100% 100% 100%   No intake or output data in the 24 hours ending 01/12/14 1707 Filed Weights   01/12/14 0232  Weight: 106.278 kg (234 lb 4.8 oz)    Exam:   General:  AAF, No acute distress  HEENT:  NCAT, MMM  Cardiovascular:  RRR, nl S1, S2 no mrg, 2+ pulses, warm extremities  Respiratory:  CTAB, no increased WOB at rest.  When asked to sit up and take deep breaths, she developed stuttering inspiratory stridor that resolved some with distraction and instructions  Abdomen:   NABS, soft, NT/ND  MSK:   Normal tone and bulk, no LEE  Neuro:  Grossly intact  Data Reviewed: Basic Metabolic Panel:  Recent Labs Lab 01/08/14 1310 01/11/14 1515 01/11/14 1831 01/11/14 2138  NA 141 140 140  --   K 4.6 6.4* 4.3  --   CL 102 104 100  --   CO2 $Re'26 21 25  'kxT$ --   GLUCOSE 93 104* 116*  --   BUN $Re'10 10 10  'Aom$ --   CREATININE 0.86 0.67 0.81  --  CALCIUM 10.0 9.6 10.0  --   MG  --   --   --  2.0   Liver Function Tests:  Recent Labs Lab 01/08/14 1310 01/11/14 1515  AST 28 59*  ALT 34 33  ALKPHOS 75 57  BILITOT 0.4 0.3  PROT 7.7 7.9  ALBUMIN 4.3 4.0   No results found for this basename: LIPASE, AMYLASE,  in the last 168 hours  Recent Labs Lab 01/12/14 0335  AMMONIA 24   CBC:  Recent Labs Lab 01/08/14 1310 01/11/14 1515  WBC 10.4 9.5  NEUTROABS 4.3  --   HGB 12.6 12.7  HCT 38.0 37.6  MCV 88.0 87.9  PLT 353 398   Cardiac Enzymes:  Recent Labs Lab 01/08/14 1310 01/12/14 0335  CKTOTAL  --  62  TROPONINI <0.30  --    BNP (last 3 results)  Recent Labs  01/12/14 0335  PROBNP 23.6   CBG: No results found for this basename: GLUCAP,  in the last 168 hours  No results found for this or any  previous visit (from the past 240 hour(s)).   Studies: Dg Chest 2 View  01/12/2014   CLINICAL DATA:  Shortness of breath, nausea  EXAM: CHEST  2 VIEW  COMPARISON:  January 11, 2014  FINDINGS: The heart size and mediastinal contours are within normal limits. There is no focal infiltrate, pulmonary edema, or pleural effusion. The visualized skeletal structures are stable.  IMPRESSION: No active cardiopulmonary disease.   Electronically Signed   By: Abelardo Diesel M.D.   On: 01/12/2014 02:16   Dg Chest 2 View  01/11/2014   CLINICAL DATA:  Shortness of breath  EXAM: CHEST  2 VIEW  COMPARISON:  04/27/2013  FINDINGS: The heart size and mediastinal contours are within normal limits. Both lungs are clear. The visualized skeletal structures are unremarkable.  IMPRESSION: No active cardiopulmonary disease.   Electronically Signed   By: Inez Catalina M.D.   On: 01/11/2014 16:08   Dg Lumbar Spine Complete  01/11/2014   CLINICAL DATA:  Low back pain for several months.  EXAM: LUMBAR SPINE - COMPLETE 4+ VIEW  COMPARISON:  None.  FINDINGS: There is no evidence of lumbar spine fracture. Alignment is normal. Intervertebral disc spaces are maintained.  IMPRESSION: Negative exam.   Electronically Signed   By: Inge Rise M.D.   On: 01/11/2014 20:37   Ct Head Wo Contrast  01/12/2014   CLINICAL DATA:  Headache and dizziness.  Tremors.  EXAM: CT HEAD WITHOUT CONTRAST  TECHNIQUE: Contiguous axial images were obtained from the base of the skull through the vertex without intravenous contrast.  COMPARISON:  None.  FINDINGS: No mass lesion, mass effect, midline shift, hydrocephalus, hemorrhage. No territorial ischemia or acute infarction. Paranasal sinuses appear within normal limits. Mastoid air cells clear.  IMPRESSION: Negative CT head.   Electronically Signed   By: Dereck Ligas M.D.   On: 01/12/2014 02:30   Ct Soft Tissue Neck W Contrast  01/12/2014   CLINICAL DATA:  Throat pain. Headache, dizziness and tremors.  Shortness of breath.  EXAM: CT NECK WITH CONTRAST  TECHNIQUE: Multidetector CT imaging of the neck was performed using the standard protocol following the bolus administration of intravenous contrast.  CONTRAST:  62mL OMNIPAQUE IOHEXOL 300 MG/ML  SOLN  COMPARISON:  CT HEAD W/O CM dated 01/12/2014; CT C SPINE W/O CM dated 09/01/2013; CT HEAD W/CM dated 09/01/2013  FINDINGS: Study is degraded by large amount of artifact from corpectomy and ACDF  device extending from on C4 through C6 and terminating over T1. Allowing for this artifact, the chest and thoracic inlet appear within normal limits. Small symmetric Level 2 lymph nodes are present which are probably reactive to upper were airway process. There is a small prevertebral effusion. There is no peritonsillar abscess.  Bones demonstrate C2-C3 and C3-C4 degenerative disease which appears similar to the prior exam. No cervical spine fracture or or acute abnormality is identified. Mild adenoidal hypertrophy is present.  IMPRESSION: 1. Study degraded by long segment cervical spine stabilization hardware. 2. Small prevertebral effusion, most commonly associated with upper respiratory infection. No abscess. 3. Symmetric bilateral level 2 lymphadenopathy is probably reactive.   Electronically Signed   By: Dereck Ligas M.D.   On: 01/12/2014 02:34    Scheduled Meds: . pantoprazole  40 mg Oral Daily   Continuous Infusions: . sodium chloride 100 mL/hr at 01/12/14 0507    Active Problems:   Dyspnea    Time spent: 30 min    Maram Bently, [facility] Pager [phone]. If 7PM-7AM, please contact night-coverage at [email or web address], password [facility] 01/12/2014, 5:07 PM  LOS: 1 day

## 2014-01-13 DIAGNOSIS — G252 Other specified forms of tremor: Secondary | ICD-10-CM

## 2014-01-13 DIAGNOSIS — R0989 Other specified symptoms and signs involving the circulatory and respiratory systems: Principal | ICD-10-CM

## 2014-01-13 DIAGNOSIS — G250 Essential tremor: Secondary | ICD-10-CM

## 2014-01-13 DIAGNOSIS — R0609 Other forms of dyspnea: Secondary | ICD-10-CM

## 2014-01-13 MED ORDER — GABAPENTIN 600 MG PO TABS
300.0000 mg | ORAL_TABLET | Freq: Every day | ORAL | Status: DC
  Filled 2014-01-13: qty 0.5

## 2014-01-13 MED ORDER — GABAPENTIN 100 MG PO CAPS: 100.0000 mg | ORAL_CAPSULE | Freq: Two times a day (BID) | ORAL | Status: DC

## 2014-01-13 MED ORDER — GABAPENTIN 300 MG PO CAPS
300.0000 mg | ORAL_CAPSULE | Freq: Every day | ORAL | Status: DC
  Filled 2014-01-13: qty 1

## 2014-01-13 NOTE — Progress Notes (Signed)
Subjective: Patient continues to have tremor.  It is more prominent when talking about it and not as apparent when she is focusing on other things. When I first enter the room there is no tremor present at all.    Objective: Current vital signs: BP 140/84  Pulse 58  Temp(Src) 98.1 F (36.7 C) (Oral)  Resp 20  Ht 5\' 4"  (1.626 m)  Wt 108.41 kg (239 lb)  BMI 41.00 kg/m2  SpO2 98% Vital signs in last 24 hours: Temp:  [97.7 F (36.5 C)-98.9 F (37.2 C)] 98.1 F (36.7 C) (02/12 0815) Pulse Rate:  [58-75] 58 (02/12 0815) Resp:  [18-20] 20 (02/12 0815) BP: (125-150)/(70-84) 140/84 mmHg (02/12 0815) SpO2:  [97 %-100 %] 98 % (02/12 0815) Weight:  [108.41 kg (239 lb)] 108.41 kg (239 lb) (02/12 0515)  Intake/Output from previous day: 02/11 0701 - 02/12 0700 In: 1000 [I.V.:1000] Out: -  Intake/Output this shift:   Nutritional status: Cardiac  Neurologic Exam: Mental Status:  Alert, oriented, thought content appropriate. Speech fluent without evidence of aphasia. Able to follow 3 step commands without difficulty.  Cranial Nerves:  II: Discs flat bilaterally; Visual fields grossly normal, pupils equal, round, reactive to light and accommodation  III,IV, VI: ptosis not present, extra-ocular motions intact bilaterally  V,VII: smile symmetric, facial light touch sensation normal bilaterally  VIII: hearing normal bilaterally  IX,X: gag reflex present  XI: bilateral shoulder shrug  XII: midline tongue extension without atrophy or fasciculations  Motor:  5/5 throughout Tone and bulk:normal tone throughout; no atrophy noted  Sensory: Pinprick and light touch intact throughout, bilaterally  Deep Tendon Reflexes:  2+ throughout Plantars:  Right: downgoing  Left: downgoing  Cerebellar normal finger-to-nose, normal heel-to-shin testing.  +Head tremor      Lab Results: Basic Metabolic Panel:  Recent Labs Lab 01/08/14 1310 01/11/14 1515 01/11/14 1831 01/11/14 2138  NA 141 140  140  --   K 4.6 6.4* 4.3  --   CL 102 104 100  --   CO2 26 21 25   --   GLUCOSE 93 104* 116*  --   BUN 10 10 10   --   CREATININE 0.86 0.67 0.81  --   CALCIUM 10.0 9.6 10.0  --   MG  --   --   --  2.0    Liver Function Tests:  Recent Labs Lab 01/08/14 1310 01/11/14 1515  AST 28 59*  ALT 34 33  ALKPHOS 75 57  BILITOT 0.4 0.3  PROT 7.7 7.9  ALBUMIN 4.3 4.0    Recent Labs Lab 01/12/14 0335  LIPASE 22    Recent Labs Lab 01/12/14 0335  AMMONIA 24    CBC:  Recent Labs Lab 01/08/14 1310 01/11/14 1515  WBC 10.4 9.5  NEUTROABS 4.3  --   HGB 12.6 12.7  HCT 38.0 37.6  MCV 88.0 87.9  PLT 353 398    Cardiac Enzymes:  Recent Labs Lab 01/08/14 1310 01/12/14 0335  CKTOTAL  --  49  TROPONINI <0.30  --     Lipid Panel: No results found for this basename: CHOL, TRIG, HDL, CHOLHDL, VLDL, LDLCALC,  in the last 168 hours  CBG: No results found for this basename: GLUCAP,  in the last 168 hours  Microbiology: Results for orders placed in visit on 03/03/13  URINE CULTURE     Status: None   Collection Time    03/03/13  5:08 PM      Result Value Ref Range Status  Colony Count 9,000 COLONIES/ML   Final   Organism ID, Bacteria Insignificant Growth   Final    Coagulation Studies: No results found for this basename: LABPROT, INR,  in the last 72 hours  Imaging: Dg Chest 2 View  01/12/2014   CLINICAL DATA:  Shortness of breath, nausea  EXAM: CHEST  2 VIEW  COMPARISON:  January 11, 2014  FINDINGS: The heart size and mediastinal contours are within normal limits. There is no focal infiltrate, pulmonary edema, or pleural effusion. The visualized skeletal structures are stable.  IMPRESSION: No active cardiopulmonary disease.   Electronically Signed   By: Abelardo Diesel M.D.   On: 01/12/2014 02:16   Dg Chest 2 View  01/11/2014   CLINICAL DATA:  Shortness of breath  EXAM: CHEST  2 VIEW  COMPARISON:  04/27/2013  FINDINGS: The heart size and mediastinal contours are within  normal limits. Both lungs are clear. The visualized skeletal structures are unremarkable.  IMPRESSION: No active cardiopulmonary disease.   Electronically Signed   By: Inez Catalina M.D.   On: 01/11/2014 16:08   Dg Lumbar Spine Complete  01/11/2014   CLINICAL DATA:  Low back pain for several months.  EXAM: LUMBAR SPINE - COMPLETE 4+ VIEW  COMPARISON:  None.  FINDINGS: There is no evidence of lumbar spine fracture. Alignment is normal. Intervertebral disc spaces are maintained.  IMPRESSION: Negative exam.   Electronically Signed   By: Inge Rise M.D.   On: 01/11/2014 20:37   Ct Head Wo Contrast  01/12/2014   CLINICAL DATA:  Headache and dizziness.  Tremors.  EXAM: CT HEAD WITHOUT CONTRAST  TECHNIQUE: Contiguous axial images were obtained from the base of the skull through the vertex without intravenous contrast.  COMPARISON:  None.  FINDINGS: No mass lesion, mass effect, midline shift, hydrocephalus, hemorrhage. No territorial ischemia or acute infarction. Paranasal sinuses appear within normal limits. Mastoid air cells clear.  IMPRESSION: Negative CT head.   Electronically Signed   By: Dereck Ligas M.D.   On: 01/12/2014 02:30   Ct Soft Tissue Neck W Contrast  01/12/2014   CLINICAL DATA:  Throat pain. Headache, dizziness and tremors. Shortness of breath.  EXAM: CT NECK WITH CONTRAST  TECHNIQUE: Multidetector CT imaging of the neck was performed using the standard protocol following the bolus administration of intravenous contrast.  CONTRAST:  28mL OMNIPAQUE IOHEXOL 300 MG/ML  SOLN  COMPARISON:  CT HEAD W/O CM dated 01/12/2014; CT C SPINE W/O CM dated 09/01/2013; CT HEAD W/CM dated 09/01/2013  FINDINGS: Study is degraded by large amount of artifact from corpectomy and ACDF device extending from on C4 through C6 and terminating over T1. Allowing for this artifact, the chest and thoracic inlet appear within normal limits. Small symmetric Level 2 lymph nodes are present which are probably reactive to upper  were airway process. There is a small prevertebral effusion. There is no peritonsillar abscess.  Bones demonstrate C2-C3 and C3-C4 degenerative disease which appears similar to the prior exam. No cervical spine fracture or or acute abnormality is identified. Mild adenoidal hypertrophy is present.  IMPRESSION: 1. Study degraded by long segment cervical spine stabilization hardware. 2. Small prevertebral effusion, most commonly associated with upper respiratory infection. No abscess. 3. Symmetric bilateral level 2 lymphadenopathy is probably reactive.   Electronically Signed   By: Dereck Ligas M.D.   On: 01/12/2014 02:34    Medications:  I have reviewed the patient's current medications. Scheduled: . enoxaparin (LOVENOX) injection  50 mg Subcutaneous  Q24H  . gabapentin  300 mg Oral Daily  . pantoprazole  40 mg Oral Daily  . vitamin B-12  1,000 mcg Oral Daily    Assessment/Plan: Tremor continues.  No etiology noted in imaging or blood work.  No evidence of PD on examination.  Patient somewhat bradycardic.    Recommendations: 1.  Instead of further adjusting Propanolol would discontinue and start Neurontin.  Discharge dose to be 100mg  BID 2.  Patient to follow up with neurology as an outpatient.  Case discussed with Dr. Jerilee Hoh   LOS: 2 days   Paula Goodell, MD [facility] [phone] 01/13/2014  1:34 PM

## 2014-01-13 NOTE — Evaluation (Signed)
Physical Therapy Evaluation Patient Details Name: Paula [name] MRN: [id number] DOB: 1961/04/19 Today's Date: 01/13/2014 Time: 5284-1324 PT Time Calculation (min): 22 min  PT Assessment / Plan / Recommendation History of Present Illness  53 y.o. female admitted to [facility] on 01/11/14 with progressive tremor, weakness, dyspnea.  Significant PMHx of anxiety, depression, cervical fusion, diverticulosis, HTN, obesity, arthritis, and IBS.  CT negative for any acute events.  Dx with dyspnea, tremor and generalized weakness.    Clinical Impression  Pt is moving slowly, but steadily.  Despite audible gasps while walking O2 sats 96-98% on RA and Hr in the mid 60s.  Pt reporting pain in her left neck and without therapist's hand held assist was reaching for external objects for stability.  There were some inconsistencies in her exam.  See details below.   PT to follow acutely for deficits listed below.       PT Assessment  Patient needs continued PT services    Follow Up Recommendations  Home health PT;Supervision for mobility/OOB (pt declining HHPT, but agreeable to cane)    Does the patient have the potential to tolerate intense rehabilitation     NA  Barriers to Discharge   None      Equipment Recommendations  Cane pt to purchase on her own, advised on how to adjust for her height.     Recommendations for Other Services   NA   Frequency Min 3X/week    Precautions / Restrictions Precautions Precautions: Fall Precaution Comments: pt reaching for external support during gait.    Pertinent Vitals/Pain O2 sats 96-98% on RA, HR mid 60s throughout gait.       Mobility  Transfers Overall transfer level: Modified independent Equipment used: None General transfer comment: pt using hands to control transition to standing.  Ambulation/Gait Ambulation/Gait assistance: Min guard Ambulation Distance (Feet): 150 Feet Assistive device: 1 person hand held assist Gait Pattern/deviations:  Step-through pattern Gait velocity: decreased Gait velocity interpretation: <1.8 ft/sec, indicative of risk for recurrent falls General Gait Details: Pt walking very slowly due to pain and difficulty breathing.  O2 sats 96-98% on RA, HR in the mid 60s throughout session.  Pt with gasping breathing.  Encouraged slow, deep pursed lip breathing.  Pt grasping therapist's hand tightly, but more squeezing it than using it for balance.          PT Diagnosis: Difficulty walking;Generalized weakness;Abnormality of gait;Acute pain  PT Problem List: Decreased strength;Decreased activity tolerance;Decreased balance;Decreased mobility;Decreased knowledge of use of DME;Cardiopulmonary status limiting activity;Pain PT Treatment Interventions: DME instruction;Gait training;Stair training;Functional mobility training;Therapeutic activities;Therapeutic exercise;Balance training;Neuromuscular re-education;Patient/family education;Modalities     PT Goals(Current goals can be found in the care plan section) Acute Rehab PT Goals Patient Stated Goal: to figure out why all of this is happening.  PT Goal Formulation: With patient/family Time For Goal Achievement: 01/27/14 Potential to Achieve Goals: Good  Visit Information  Last PT Received On: 01/13/14 Assistance Needed: +1 History of Present Illness: 53 y.o. female admitted to [facility] on 01/11/14 with progressive tremor, weakness, dyspnea.  Significant PMHx of anxiety, depression, cervical fusion, diverticulosis, HTN, obesity, arthritis, and IBS.  CT negative for any acute events.  Dx with dyspnea, tremor and generalized weakness.         Prior [place] expects to be discharged to:: Private residence Living Arrangements: Spouse/significant other Available Help at Discharge: Family;Available 24 hours/day Type of Home: House Home Access: Stairs to enter [employer] of Steps: 3 Entrance Stairs-Rails:  Right;Left;Can reach  both Home Layout: One level Home Equipment: None Additional Comments: Pt and husband don't work.  Husband can provide 24/7 physical assist if needed.  Prior Function Level of Independence: Independent Comments: Per pt she has been getting progressively weaker for 3 months.  She reports her husband sometimes has to help her off of the commode when she sits there too long because her legs go numb.   Communication Communication: No difficulties    Cognition  Cognition Arousal/Alertness: Awake/alert Behavior During Therapy: WFL for tasks assessed/performed Overall Cognitive Status: Within Functional Limits for tasks assessed    Extremity/Trunk Assessment Upper Extremity Assessment Upper Extremity Assessment: Generalized weakness;LUE deficits/detail LUE Deficits / Details: sensation deficits, see below.  LUE Sensation: decreased light touch (on forearm, reports she wakes up and both arms are numb/stif) LUE Coordination:  [facility]) Lower Extremity Assessment Lower Extremity Assessment: LLE deficits/detail LLE Deficits / Details: Per pt report decreased sensation in the leg 4/5 strength ankle, knee, and 3+/5 strength hip.   LLE Sensation: decreased light touch LLE Coordination:  (WFL) Cervical / Trunk Assessment Cervical / Trunk Assessment: Other exceptions Cervical / Trunk Exceptions: h/o anterior cervical fusion   Balance Balance Overall balance assessment: Needs assistance Sitting-balance support: No upper extremity supported;Feet supported Sitting balance-Leahy Scale: Good Standing balance support: Single extremity supported Standing balance-Leahy Scale: Poor Standing balance comment: pt when let go reaches for objects in room during gait for stability.   General Comments General comments (skin integrity, edema, etc.): Pt's  breathing issues and tremor are both inconsistant throughout session.  The tremor would come and go while she was moving and come and go at rest, breathing issues  were mostly while moving, but only during gait, not while walking chair to bed.    End of Session PT - End of Session Equipment Utilized During Treatment: Gait belt Activity Tolerance: Patient limited by fatigue;Patient limited by pain Patient left: in chair;with call bell/phone within reach;with family/visitor present Nurse Communication: Mobility status;Other (comment) (need SPC, talked to Radio producer)  GP Functional Assessment Tool Used: assist level Functional Limitation: Mobility: Walking and moving around Mobility: Walking and Moving Around Current Status [phone]): At least 1 percent but less than 20 percent impaired, limited or restricted Mobility: Walking and Moving Around Goal Status [phone]): At least 1 percent but less than 20 percent impaired, limited or restricted Mobility: Walking and Moving Around Discharge Status [phone]): At least 1 percent but less than 20 percent impaired, limited or restricted   Lynde Ludwig B. [place], [place], DPT [phone]   01/13/2014, 11:53 AM

## 2014-01-13 NOTE — Discharge Instructions (Signed)
Gabapentin capsules or tablets What is this medicine? GABAPENTIN (GA ba pen tin) is used to control partial seizures in adults with epilepsy. It is also used to treat certain types of nerve pain. This medicine may be used for other purposes; ask your health care provider or pharmacist if you have questions. COMMON BRAND NAME(S): Orpha Bur , Neurontin What should I tell my health care provider before I take this medicine? They need to know if you have any of these conditions: -kidney disease -suicidal thoughts, plans, or attempt; a previous suicide attempt by you or a family member -an unusual or allergic reaction to gabapentin, other medicines, foods, dyes, or preservatives -pregnant or trying to get pregnant -breast-feeding How should I use this medicine? Take this medicine by mouth with a glass of water. Follow the directions on the prescription label. You can take it with or without food. If it upsets your stomach, take it with food.Take your medicine at regular intervals. Do not take it more often than directed. Do not stop taking except on your doctor's advice. If you are directed to break the 600 or 800 mg tablets in half as part of your dose, the extra half tablet should be used for the next dose. If you have not used the extra half tablet within 28 days, it should be thrown away. A special MedGuide will be given to you by the pharmacist with each prescription and refill. Be sure to read this information carefully each time. Talk to your pediatrician regarding the use of this medicine in children. Special care may be needed. Overdosage: If you think you have taken too much of this medicine contact a poison control center or emergency room at once. NOTE: This medicine is only for you. Do not share this medicine with others. What if I miss a dose? If you miss a dose, take it as soon as you can. If it is almost time for your next dose, take only that dose. Do not take double or extra  doses. What may interact with this medicine? Do not take this medicine with any of the following medications: -other gabapentin products This medicine may also interact with the following medications: -alcohol -antacids -antihistamines for allergy, cough and cold -certain medicines for anxiety or sleep -certain medicines for depression or psychotic disturbances -homatropine; hydrocodone -naproxen -narcotic medicines (opiates) for pain -phenothiazines like chlorpromazine, mesoridazine, prochlorperazine, thioridazine This list may not describe all possible interactions. Give your health care provider a list of all the medicines, herbs, non-prescription drugs, or dietary supplements you use. Also tell them if you smoke, drink alcohol, or use illegal drugs. Some items may interact with your medicine. What should I watch for while using this medicine? Visit your doctor or health care professional for regular checks on your progress. You may want to keep a record at home of how you feel your condition is responding to treatment. You may want to share this information with your doctor or health care professional at each visit. You should contact your doctor or health care professional if your seizures get worse or if you have any new types of seizures. Do not stop taking this medicine or any of your seizure medicines unless instructed by your doctor or health care professional. Stopping your medicine suddenly can increase your seizures or their severity. Wear a medical identification bracelet or chain if you are taking this medicine for seizures, and carry a card that lists all your medications. You may get drowsy, dizzy, or have  blurred vision. Do not drive, use machinery, or do anything that needs mental alertness until you know how this medicine affects you. To reduce dizzy or fainting spells, do not sit or stand up quickly, especially if you are an older patient. Alcohol can increase drowsiness and  dizziness. Avoid alcoholic drinks. Your mouth may get dry. Chewing sugarless gum or sucking hard candy, and drinking plenty of water will help. The use of this medicine may increase the chance of suicidal thoughts or actions. Pay special attention to how you are responding while on this medicine. Any worsening of mood, or thoughts of suicide or dying should be reported to your health care professional right away. Women who become pregnant while using this medicine may enroll in the [place] Pregnancy Registry by calling [phone]. This registry collects information about the safety of antiepileptic drug use during pregnancy. What side effects may I notice from receiving this medicine? Side effects that you should report to your doctor or health care professional as soon as possible: -allergic reactions like skin rash, itching or hives, swelling of the face, lips, or tongue -worsening of mood, thoughts or actions of suicide or dying Side effects that usually do not require medical attention (report to your doctor or health care professional if they continue or are bothersome): -constipation -difficulty walking or controlling muscle movements -dizziness -nausea -slurred speech -tiredness -tremors -weight gain This list may not describe all possible side effects. Call your doctor for medical advice about side effects. You may report side effects to FDA at 1-800-FDA-1088. Where should I keep my medicine? Keep out of reach of children. Store at room temperature between 15 and 30 degrees C (59 and 86 degrees F). Throw away any unused medicine after the expiration date. NOTE: This sheet is a summary. It may not cover all possible information. If you have questions about this medicine, talk to your doctor, pharmacist, or health care provider.  2014, [employer]vier/Gold Standard. (2013-07-22 09:12:48)  Tremor Tremor is a rhythmic, involuntary muscular contraction  characterized by oscillations (to-and-fro movements) of a part of the body. The most common of all involuntary movements, tremor can affect various body parts such as the hands, head, facial structures, vocal cords, trunk, and legs; most tremors, however, occur in the hands. Tremor often accompanies neurological disorders associated with aging. Although the disorder is not life-threatening, it can be responsible for functional disability and social embarrassment. TREATMENT  There are many types of tremor and several ways in which tremor is classified. The most common classification is by behavioral context or position. There are five categories of tremor within this classification: resting, postural, kinetic, task-specific, and psychogenic. Resting or static tremor occurs when the muscle is at rest, for example when the hands are lying on the lap. This type of tremor is often seen in patients with Parkinson's disease. Postural tremor occurs when a patient attempts to maintain posture, such as holding the hands outstretched. Postural tremors include physiological tremor, essential tremor, tremor with basal ganglia disease (also seen in patients with Parkinson's disease), cerebellar postural tremor, tremor with peripheral neuropathy, post-traumatic tremor, and alcoholic tremor. Kinetic or intention (action) tremor occurs during purposeful movement, for example during finger-to-nose testing. Task-specific tremor appears when performing goal-oriented tasks such as handwriting, speaking, or standing. This group consists of primary writing tremor, vocal tremor, and orthostatic tremor. Psychogenic tremor occurs in both older and younger patients. The key feature of this tremor is that it dramatically lessens or disappears when the  patient is distracted. PROGNOSIS There are some treatment options available for tremor; the appropriate treatment depends on accurate diagnosis of the cause. Some tremors respond to treatment  of the underlying condition, for example in some cases of psychogenic tremor treating the patient's underlying mental problem may cause the tremor to disappear. Also, patients with tremor due to Parkinson's disease may be treated with Levodopa drug therapy. Symptomatic drug therapy is available for several other tremors as well. For those cases of tremor in which there is no effective drug treatment, physical measures such as teaching the patient to brace the affected limb during the tremor are sometimes useful. Surgical intervention such as thalamotomy or deep brain stimulation may be useful in certain cases. Document Released: 11/08/2002 Document Revised: 02/10/2012 Document Reviewed: 11/18/2005 [facility] Patient Information 2014 [place].

## 2014-01-13 NOTE — Discharge Summary (Addendum)
Physician Discharge Summary  Paula [name] QVZ:[id number] DOB: March 16, 1961 DOA: 01/11/2014  PCP: Paula Kehr, MD  Admit date: 01/11/2014 Discharge date: 01/13/2014  Time spent: 45 minutes  Recommendations for Outpatient Follow-up:  -Will be discharged home today. -Advised to follow up with PCP in 2 weeks.   Discharge Diagnoses:  Principal Problem:   Tremor of face and hands Active Problems:   OBESITY, MORBID   ANXIETY   DEPRESSION   Dyspnea   Discharge Condition: Stable and improved  Filed Weights   01/12/14 0232 01/13/14 0515  Weight: 106.278 kg (234 lb 4.8 oz) 108.41 kg (239 lb)    History of present illness:  This is a 53 year old female with no reported prior medical history presenting with progressive tremor, weakness, dyspnea. (Preliminary H&P as Epic is down currently. Unable to obtain full history apart from patient's report.) Patient states that she's had persistent neck tremors as well as generalized shortness of breath since 3-4 days ago. No recent trauma. No infections. No chest pain. Patient states that she went to a local urgent care with presentation of symptoms. Workup was otherwise negative with recommendation the patient go to the ER for further evaluation. Patient states that the wait was too long so she went home. Patient states that she went back to the urgent care the next day and then was referred back to the E.R. In the ER thus far, workup has been essentially negative. Blood pressure stable. Lab work within normal limits. Predominantly, patient has been unable to ambulate with severe weakness. Weakness is mainly in the left side/left leg. Mild paresthesias.  Patient does report she's been having generalized weakness over the past 6 months. No weight loss or weight gain. Patient has been under high amount of stress. Denies any significant medical problems in the past however does report having neck surgery several years ago. No radicular symptoms down the arms.  CT in the emergency room is currently pending. Hospitalist admission was requested.   Hospital Course:   Tremor -No etiology found: CT Head negative, TSH, B12 ok. -Has been evaluated by neurology who believe there is a component of a psychogenic tremor. -No need for further inpatient work up. -Was started on propranolol but this was discontinued as she became bradycardic and dizzy. -Neurology has started low-dose neurontin.  Rest of chronic medical issues have been stable and home medications have not been altered.  Procedures:  None   Consultations:  Neurology  Discharge Instructions      Discharge Orders   Future Appointments Provider Department Dept Phone   01/20/2014 11:00 AM Cassandria Anger, MD [place] [phone]   Future Orders Complete By Expires   Discontinue IV  As directed    Increase activity slowly  As directed        Medication List         gabapentin 100 MG capsule  Commonly known as:  NEURONTIN  Take 1 capsule (100 mg total) by mouth 2 (two) times daily.       Allergies  Allergen Reactions  . Aspirin Other (See Comments)    Has history of ulcers   . Hydrocodone-Acetaminophen Nausea And Vomiting  . Moxifloxacin Itching  . Oxycodone Nausea And Vomiting   Follow-up Information   Follow up with Paula Kehr, MD. Schedule an appointment as soon as possible for a visit in 1 week.   Specialty:  Internal Medicine   Contact information:   [address] [place]  [place] [address] [phone]        The results of significant diagnostics from this hospitalization (including imaging, microbiology, ancillary and laboratory) are listed below for reference.    Significant Diagnostic Studies: Dg Chest 2 View  01/12/2014   CLINICAL DATA:  Shortness of breath, nausea  EXAM: CHEST  2 VIEW  COMPARISON:  January 11, 2014  FINDINGS: The heart size and mediastinal contours are within normal  limits. There is no focal infiltrate, pulmonary edema, or pleural effusion. The visualized skeletal structures are stable.  IMPRESSION: No active cardiopulmonary disease.   Electronically Signed   By: Abelardo Diesel M.D.   On: 01/12/2014 02:16   Dg Chest 2 View  01/11/2014   CLINICAL DATA:  Shortness of breath  EXAM: CHEST  2 VIEW  COMPARISON:  04/27/2013  FINDINGS: The heart size and mediastinal contours are within normal limits. Both lungs are clear. The visualized skeletal structures are unremarkable.  IMPRESSION: No active cardiopulmonary disease.   Electronically Signed   By: Inez Catalina M.D.   On: 01/11/2014 16:08   Dg Lumbar Spine Complete  01/11/2014   CLINICAL DATA:  Low back pain for several months.  EXAM: LUMBAR SPINE - COMPLETE 4+ VIEW  COMPARISON:  None.  FINDINGS: There is no evidence of lumbar spine fracture. Alignment is normal. Intervertebral disc spaces are maintained.  IMPRESSION: Negative exam.   Electronically Signed   By: Inge Rise M.D.   On: 01/11/2014 20:37   Ct Head Wo Contrast  01/12/2014   CLINICAL DATA:  Headache and dizziness.  Tremors.  EXAM: CT HEAD WITHOUT CONTRAST  TECHNIQUE: Contiguous axial images were obtained from the base of the skull through the vertex without intravenous contrast.  COMPARISON:  None.  FINDINGS: No mass lesion, mass effect, midline shift, hydrocephalus, hemorrhage. No territorial ischemia or acute infarction. Paranasal sinuses appear within normal limits. Mastoid air cells clear.  IMPRESSION: Negative CT head.   Electronically Signed   By: Dereck Ligas M.D.   On: 01/12/2014 02:30   Ct Soft Tissue Neck W Contrast  01/12/2014   CLINICAL DATA:  Throat pain. Headache, dizziness and tremors. Shortness of breath.  EXAM: CT NECK WITH CONTRAST  TECHNIQUE: Multidetector CT imaging of the neck was performed using the standard protocol following the bolus administration of intravenous contrast.  CONTRAST:  57mL OMNIPAQUE IOHEXOL 300 MG/ML  SOLN   COMPARISON:  CT HEAD W/O CM dated 01/12/2014; CT C SPINE W/O CM dated 09/01/2013; CT HEAD W/CM dated 09/01/2013  FINDINGS: Study is degraded by large amount of artifact from corpectomy and ACDF device extending from on C4 through C6 and terminating over T1. Allowing for this artifact, the chest and thoracic inlet appear within normal limits. Small symmetric Level 2 lymph nodes are present which are probably reactive to upper were airway process. There is a small prevertebral effusion. There is no peritonsillar abscess.  Bones demonstrate C2-C3 and C3-C4 degenerative disease which appears similar to the prior exam. No cervical spine fracture or or acute abnormality is identified. Mild adenoidal hypertrophy is present.  IMPRESSION: 1. Study degraded by long segment cervical spine stabilization hardware. 2. Small prevertebral effusion, most commonly associated with upper respiratory infection. No abscess. 3. Symmetric bilateral level 2 lymphadenopathy is probably reactive.   Electronically Signed   By: Dereck Ligas M.D.   On: 01/12/2014 02:34    Microbiology: No results found for this or any previous visit (from the past 240 hour(s)).  Labs: Basic Metabolic Panel:  Recent Labs Lab 01/08/14 1310 01/11/14 1515 01/11/14 1831 01/11/14 2138  NA 141 140 140  --   K 4.6 6.4* 4.3  --   CL 102 104 100  --   CO2 26 21 25   --   GLUCOSE 93 104* 116*  --   BUN 10 10 10   --   CREATININE 0.86 0.67 0.81  --   CALCIUM 10.0 9.6 10.0  --   MG  --   --   --  2.0   Liver Function Tests:  Recent Labs Lab 01/08/14 1310 01/11/14 1515  AST 28 59*  ALT 34 33  ALKPHOS 75 57  BILITOT 0.4 0.3  PROT 7.7 7.9  ALBUMIN 4.3 4.0    Recent Labs Lab 01/12/14 0335  LIPASE 22    Recent Labs Lab 01/12/14 0335  AMMONIA 24   CBC:  Recent Labs Lab 01/08/14 1310 01/11/14 1515  WBC 10.4 9.5  NEUTROABS 4.3  --   HGB 12.6 12.7  HCT 38.0 37.6  MCV 88.0 87.9  PLT 353 398   Cardiac Enzymes:  Recent  Labs Lab 01/08/14 1310 01/12/14 0335  CKTOTAL  --  76  TROPONINI <0.30  --    BNP: BNP (last 3 results)  Recent Labs  01/12/14 0335  PROBNP 23.6   CBG: No results found for this basename: GLUCAP,  in the last 168 hours     Signed:  Lelon Frohlich  [facility] Pager: [phone] 01/13/2014, 2:27 PM

## 2014-01-14 ENCOUNTER — Encounter: Payer: Self-pay | Admitting: Internal Medicine

## 2014-01-14 ENCOUNTER — Other Ambulatory Visit: Payer: Medicare Other

## 2014-01-14 ENCOUNTER — Ambulatory Visit (INDEPENDENT_AMBULATORY_CARE_PROVIDER_SITE_OTHER): Payer: BC Managed Care – PPO | Admitting: Internal Medicine

## 2014-01-14 VITALS — BP 112/90 | HR 72 | Temp 99.2°F | Resp 16 | Wt 224.0 lb

## 2014-01-14 DIAGNOSIS — G250 Essential tremor: Secondary | ICD-10-CM

## 2014-01-14 DIAGNOSIS — R251 Tremor, unspecified: Secondary | ICD-10-CM

## 2014-01-14 DIAGNOSIS — M542 Cervicalgia: Secondary | ICD-10-CM

## 2014-01-14 DIAGNOSIS — F459 Somatoform disorder, unspecified: Secondary | ICD-10-CM

## 2014-01-14 DIAGNOSIS — F411 Generalized anxiety disorder: Secondary | ICD-10-CM

## 2014-01-14 DIAGNOSIS — F444 Conversion disorder with motor symptom or deficit: Secondary | ICD-10-CM | POA: Insufficient documentation

## 2014-01-14 DIAGNOSIS — G252 Other specified forms of tremor: Secondary | ICD-10-CM

## 2014-01-14 MED ORDER — VITAMIN D 1000 UNITS PO TABS: 1000.0000 [IU] | ORAL_TABLET | Freq: Every day | ORAL | Status: DC

## 2014-01-14 MED ORDER — CLONAZEPAM 0.25 MG PO TBDP: 0.2500 mg | ORAL_TABLET | Freq: Two times a day (BID) | ORAL | Status: DC | PRN

## 2014-01-14 NOTE — Progress Notes (Signed)
Pre visit review using our clinic review tool, if applicable. No additional management support is needed unless otherwise documented below in the visit note. 

## 2014-01-14 NOTE — Assessment & Plan Note (Signed)
2/15 - probable Head tremor, SOB, neck spasms

## 2014-01-14 NOTE — Assessment & Plan Note (Signed)
52 y/o with new onset head tremor but otherwise unimpressive neuro-exam and unremarkable CT brain.  Differential includes isolated head tremor, subtle primary cervical dystonia with concomitantly associated dystonic tremor ( she complains of painful neck spasms but no evidence of anterocollis /retrocollis/laterocollis/ torticollis or geste antagoniste on exam).  Can not entirely exclude new onset psychogenic tremor.  If she has a dystonic tremor, then will require Botox treatment as outpatient.  Trial of Propranolol or gabapentin in case we are dealing with a primary tremor with ET-like characteristics Osvaldo Camilo, MD  01/12/2014, 2:04 AM  

## 2014-01-14 NOTE — Progress Notes (Signed)
Subjective:    Patient ID: Paula [name], female    DOB: [birth year], 53 y.o.   MRN: [id number]  HPI  Post-hop f/up: Discharge (01/13/14) Diagnoses:  Principal Problem:  Tremor of face and hands  Active Problems:  OBESITY, MORBID  ANXIETY  DEPRESSION  Dyspnea  Discharge Condition: Stable and improved  Filed Weights    01/12/14 0232  01/13/14 0515   Weight:  106.278 kg (234 lb 4.8 oz)  108.41 kg (239 lb)   History of present illness:  This is a 53 year old female with no reported prior medical history presenting with progressive tremor, weakness, dyspnea. (Preliminary H&P as Epic is down currently. Unable to obtain full history apart from patient's report.) Patient states that she's had persistent neck tremors as well as generalized shortness of breath since 3-4 days ago. No recent trauma. No infections. No chest pain. Patient states that she went to a local urgent care with presentation of symptoms. Workup was otherwise negative with recommendation the patient go to the ER for further evaluation. Patient states that the wait was too long so she went home. Patient states that she went back to the urgent care the next day and then was referred back to the E.R. In the ER thus far, workup has been essentially negative. Blood pressure stable. Lab work within normal limits. Predominantly, patient has been unable to ambulate with severe weakness. Weakness is mainly in the left side/left leg. Mild paresthesias.  Patient does report she's been having generalized weakness over the past 6 months. No weight loss or weight gain. Patient has been under high amount of stress. Denies any significant medical problems in the past however does report having neck surgery several years ago. No radicular symptoms down the arms. CT in the emergency room is currently pending. Hospitalist admission was requested.  Hospital Course:  Tremor  -No etiology found: CT Head negative, TSH, B12 ok.  -Has been evaluated by  neurology who believe there is a component of a psychogenic tremor.  -No need for further inpatient work up.  -Was started on propranolol but this was discontinued as she became bradycardic and dizzy.  -Neurology has started low-dose neurontin.  Rest of chronic medical issues have been stable and home medications have not been altered.  Procedures:  None  Consultations:  Neurology Discharge Instructions      Discharge Orders    Future Appointments  Provider  Department  Dept Phone    01/20/2014 11:00 AM  Cassandria Anger, MD  [facility]  [phone]    Future Orders  Complete By  Expires    Discontinue IV  As directed     Increase activity slowly  As directed         Medication List         gabapentin 100 MG capsule    Commonly known as: NEURONTIN    Take 1 capsule (100 mg total) by mouth 2 (two) times daily.      Allergies   Allergen  Reactions   .  Aspirin  Other (See Comments)     Has history of ulcers   .  Hydrocodone-Acetaminophen  Nausea And Vomiting   .  Moxifloxacin  Itching   .  Oxycodone  Nausea And Vomiting    Follow-up Information    Follow up with [name], MD. Schedule an appointment as soon as possible for a visit in 1 week.    Specialty: Internal Medicine    Contact  information:    [address]  [address]  [place] [address]  [phone]      The results of significant diagnostics from this hospitalization (including imaging, microbiology, ancillary and laboratory) are listed below for reference.   Significant Diagnostic Studies:  Dg Chest 2 View  01/12/2014 CLINICAL DATA: Shortness of breath, nausea EXAM: CHEST 2 VIEW COMPARISON: January 11, 2014 FINDINGS: The heart size and mediastinal contours are within normal limits. There is no focal infiltrate, pulmonary edema, or pleural effusion. The visualized skeletal structures are stable. IMPRESSION: No active cardiopulmonary disease. Electronically  Signed By: Abelardo Diesel M.D. On: 01/12/2014 02:16  Dg Chest 2 View  01/11/2014 CLINICAL DATA: Shortness of breath EXAM: CHEST 2 VIEW COMPARISON: 04/27/2013 FINDINGS: The heart size and mediastinal contours are within normal limits. Both lungs are clear. The visualized skeletal structures are unremarkable. IMPRESSION: No active cardiopulmonary disease. Electronically Signed By: Inez Catalina M.D. On: 01/11/2014 16:08  Dg Lumbar Spine Complete  01/11/2014 CLINICAL DATA: Low back pain for several months. EXAM: LUMBAR SPINE - COMPLETE 4+ VIEW COMPARISON: None. FINDINGS: There is no evidence of lumbar spine fracture. Alignment is normal. Intervertebral disc spaces are maintained. IMPRESSION: Negative exam. Electronically Signed By: Inge Rise M.D. On: 01/11/2014 20:37  Ct Head Wo Contrast  01/12/2014 CLINICAL DATA: Headache and dizziness. Tremors. EXAM: CT HEAD WITHOUT CONTRAST TECHNIQUE: Contiguous axial images were obtained from the base of the skull through the vertex without intravenous contrast. COMPARISON: None. FINDINGS: No mass lesion, mass effect, midline shift, hydrocephalus, hemorrhage. No territorial ischemia or acute infarction. Paranasal sinuses appear within normal limits. Mastoid air cells clear. IMPRESSION: Negative CT head. Electronically Signed By: Dereck Ligas M.D. On: 01/12/2014 02:30  Ct Soft Tissue Neck W Contrast  01/12/2014 CLINICAL DATA: Throat pain. Headache, dizziness and tremors. Shortness of breath. EXAM: CT NECK WITH CONTRAST TECHNIQUE: Multidetector CT imaging of the neck was performed using the standard protocol following the bolus administration of intravenous contrast. CONTRAST: 73mL OMNIPAQUE IOHEXOL 300 MG/ML SOLN COMPARISON: CT HEAD W/O CM dated 01/12/2014; CT C SPINE W/O CM dated 09/01/2013; CT HEAD W/CM dated 09/01/2013 FINDINGS: Study is degraded by large amount of artifact from corpectomy and ACDF device extending from on C4 through C6 and terminating over T1. Allowing for  this artifact, the chest and thoracic inlet appear within normal limits. Small symmetric Level 2 lymph nodes are present which are probably reactive to upper were airway process. There is a small prevertebral effusion. There is no peritonsillar abscess. Bones demonstrate C2-C3 and C3-C4 degenerative disease which appears similar to the prior exam. No cervical spine fracture or or acute abnormality is identified. Mild adenoidal hypertrophy is present. IMPRESSION: 1. Study degraded by long segment cervical spine stabilization hardware. 2. Small prevertebral effusion, most commonly associated with upper respiratory infection. No abscess. 3. Symmetric bilateral level 2 lymphadenopathy is probably reactive. Electronically Signed By: Dereck Ligas M.D. On: 01/12/2014 02:34  Microbiology:   Review of Systems  Constitutional: Positive for fatigue. Negative for chills, activity change, appetite change and unexpected weight change.  HENT: Negative for congestion, mouth sores, rhinorrhea and sinus pressure.   Eyes: Negative for visual disturbance.  Respiratory: Negative for cough and chest tightness.   Gastrointestinal: Negative for nausea, vomiting and abdominal pain.  Genitourinary: Negative for frequency, difficulty urinating and vaginal pain.  Musculoskeletal: Positive for back pain and neck pain. Negative for gait problem.  Skin: Negative for pallor and rash.  Allergic/Immunologic: Negative for immunocompromised state.  Neurological: Positive for dizziness, tremors, weakness and light-headedness. Negative for seizures, syncope, numbness and headaches.  Psychiatric/Behavioral: Negative for suicidal ideas, confusion and sleep disturbance. The patient is nervous/anxious.        Objective:   Physical Exam  Constitutional: She appears well-developed. No distress.  Obese  HENT:  Head: Normocephalic.  Right Ear: External ear normal.  Left Ear: External ear normal.  Nose: Nose normal.  Mouth/Throat:  Oropharynx is clear and moist.  Eyes: Conjunctivae are normal. Pupils are equal, round, and reactive to light. Right eye exhibits no discharge. Left eye exhibits no discharge.  Neck: Normal range of motion. Neck supple. No JVD present. No tracheal deviation present. No thyromegaly present.  Cardiovascular: Normal rate, regular rhythm and normal heart sounds.   Pulmonary/Chest: No stridor. No respiratory distress. She has no wheezes.  Abdominal: Soft. Bowel sounds are normal. She exhibits no distension and no mass. There is no tenderness. There is no rebound and no guarding.  Musculoskeletal: She exhibits tenderness (neck). She exhibits no edema.  Lymphadenopathy:    She has no cervical adenopathy.  Neurological: She displays normal reflexes. No cranial nerve deficit. She exhibits normal muscle tone. Coordination abnormal.  Head and hands tremor - dramatic (would disappear when distracted) Dramatic ataxia and dysbalance   Skin: No rash noted. No erythema. No pallor.  Psychiatric: She has a normal mood and affect. Judgment and thought content normal.          Assessment & Plan:

## 2014-01-14 NOTE — Assessment & Plan Note (Addendum)
53 y/o with new onset head tremor but otherwise unimpressive neuro-exam and unremarkable CT brain.  Differential includes isolated head tremor, subtle primary cervical dystonia with concomitantly associated dystonic tremor ( she complains of painful neck spasms but no evidence of anterocollis /retrocollis/laterocollis/ torticollis or geste antagoniste on exam).  Can not entirely exclude new onset psychogenic tremor.  If she has a dystonic tremor, then will require Botox treatment as outpatient.  Trial of Propranolol or gabapentin in case we are dealing with a primary tremor with ET-like characteristics Dorian Pod, MD  01/12/2014, 2:04 AM

## 2014-01-14 NOTE — Assessment & Plan Note (Signed)
Continue with current prescription therapy as reflected on the Med list.  

## 2014-01-14 NOTE — Patient Instructions (Signed)
No driving until better

## 2014-01-15 LAB — DRUG SCREEN, URINE
AMPHETAMINE SCRN UR: NEGATIVE
BARBITURATE QUANT UR: NEGATIVE
BENZODIAZEPINES.: NEGATIVE
COCAINE METABOLITES: NEGATIVE
Creatinine,U: 91.68 mg/dL
MARIJUANA METABOLITE: NEGATIVE
METHADONE: NEGATIVE
Opiates: NEGATIVE
Phencyclidine (PCP): NEGATIVE
Propoxyphene: NEGATIVE

## 2014-01-17 ENCOUNTER — Telehealth: Payer: Self-pay | Admitting: Internal Medicine

## 2014-01-17 DIAGNOSIS — G4733 Obstructive sleep apnea (adult) (pediatric): Secondary | ICD-10-CM

## 2014-01-17 DIAGNOSIS — Z9989 Dependence on other enabling machines and devices: Principal | ICD-10-CM

## 2014-01-17 NOTE — Telephone Encounter (Signed)
Pt has questions and concerns about her c-pap machine which is over 54 years old.  It has mold in it.  She wants to know if this can be causing her symptoms.  Does she need a new one.

## 2014-01-17 NOTE — Telephone Encounter (Signed)
No, however, she needs a new one. Where did she get the machine?

## 2014-01-19 NOTE — Telephone Encounter (Signed)
Pt informed of below. She got her old one at [facility]. Does she need a new sleep study? Please advise.

## 2014-01-19 NOTE — Telephone Encounter (Signed)
She should have a Pulm cons - will refer Thx

## 2014-01-20 ENCOUNTER — Ambulatory Visit: Payer: BC Managed Care – PPO | Admitting: Internal Medicine

## 2014-01-20 NOTE — Telephone Encounter (Signed)
Referral placed.

## 2014-01-28 ENCOUNTER — Ambulatory Visit (INDEPENDENT_AMBULATORY_CARE_PROVIDER_SITE_OTHER): Payer: BC Managed Care – PPO | Admitting: Internal Medicine

## 2014-01-28 VITALS — BP 140/90 | HR 80 | Temp 97.8°F | Resp 16

## 2014-01-28 DIAGNOSIS — R251 Tremor, unspecified: Secondary | ICD-10-CM

## 2014-01-28 DIAGNOSIS — G4700 Insomnia, unspecified: Secondary | ICD-10-CM

## 2014-01-28 DIAGNOSIS — G252 Other specified forms of tremor: Secondary | ICD-10-CM

## 2014-01-28 DIAGNOSIS — G250 Essential tremor: Secondary | ICD-10-CM

## 2014-01-28 DIAGNOSIS — K219 Gastro-esophageal reflux disease without esophagitis: Secondary | ICD-10-CM

## 2014-01-28 DIAGNOSIS — Z9989 Dependence on other enabling machines and devices: Secondary | ICD-10-CM

## 2014-01-28 DIAGNOSIS — F411 Generalized anxiety disorder: Secondary | ICD-10-CM

## 2014-01-28 DIAGNOSIS — G4733 Obstructive sleep apnea (adult) (pediatric): Secondary | ICD-10-CM | POA: Insufficient documentation

## 2014-01-28 DIAGNOSIS — F459 Somatoform disorder, unspecified: Secondary | ICD-10-CM

## 2014-01-28 MED ORDER — OMEPRAZOLE MAGNESIUM 20 MG PO TBEC: 20.0000 mg | DELAYED_RELEASE_TABLET | Freq: Every day | ORAL | Status: DC

## 2014-01-28 NOTE — Assessment & Plan Note (Signed)
Continue with current prescription therapy as reflected on the Med list. Will consult Dr Armida Sans

## 2014-01-28 NOTE — Assessment & Plan Note (Signed)
2/15 - ???probable Head tremor, SOB, neck spasms  F/u w/Dr Armida Sans

## 2014-01-28 NOTE — Progress Notes (Signed)
Subjective:    HPI  Post-hosp f/up -- tremor and throat spasms are a little better; memory is bad. Pt came w/her dtr and her sister.  Discharge (01/13/14) Diagnoses:  Principal Problem:  Tremor of face and hands  Active Problems:  OBESITY, MORBID  ANXIETY  DEPRESSION  Dyspnea  Discharge Condition: Stable and improved  Filed Weights    01/12/14 0232  01/13/14 0515   Weight:  106.278 kg (234 lb 4.8 oz)  108.41 kg (239 lb)   History of present illness:  This is a 53 year old female with no reported prior medical history presenting with progressive tremor, weakness, dyspnea. (Preliminary H&P as Epic is down currently. Unable to obtain full history apart from patient's report.) Patient states that she's had persistent neck tremors as well as generalized shortness of breath since 3-4 days ago. No recent trauma. No infections. No chest pain. Patient states that she went to a local urgent care with presentation of symptoms. Workup was otherwise negative with recommendation the patient go to the ER for further evaluation. Patient states that the wait was too long so she went home. Patient states that she went back to the urgent care the next day and then was referred back to the E.R. In the ER thus far, workup has been essentially negative. Blood pressure stable. Lab work within normal limits. Predominantly, patient has been unable to ambulate with severe weakness. Weakness is mainly in the left side/left leg. Mild paresthesias.  Patient does report she's been having generalized weakness over the past 6 months. No weight loss or weight gain. Patient has been under high amount of stress. Denies any significant medical problems in the past however does report having neck surgery several years ago. No radicular symptoms down the arms. CT in the emergency room is currently pending. Hospital Course:  Tremor  -No etiology found: CT Head negative, TSH, B12 ok.  -Has been evaluated by neurology who  believe there is a component of a psychogenic tremor.  -No need for further inpatient work up.  -Was started on propranolol but this was discontinued as she became bradycardic and dizzy.  -Neurology has started low-dose neurontin.  Rest of chronic medical issues have been stable and home medications have not been altered.  Procedures:  None  Consultations:  Neurology Discharge Instructions      Discharge Orders    Future Appointments  Provider  Department  Dept Phone    01/20/2014 11:00 AM  Tresa Garter, MD  [facility]  [phone]    Future Orders  Complete By  Expires    Discontinue IV  As directed     Increase activity slowly  As directed         Medication List         gabapentin 100 MG capsule    Commonly known as: NEURONTIN    Take 1 capsule (100 mg total) by mouth 2 (two) times daily.      Allergies   Allergen  Reactions   .  Aspirin  Other (See Comments)     Has history of ulcers   .  Hydrocodone-Acetaminophen  Nausea And Vomiting   .  Moxifloxacin  Itching   .  Oxycodone  Nausea And Vomiting    Follow-up Information    Follow up with Sonda Primes, MD. Schedule an appointment as soon as possible for a visit in 1 week.    Specialty: Internal Medicine    Contact information:  [address]  [address]  [place] [address]  [phone]      The results of significant diagnostics from this hospitalization (including imaging, microbiology, ancillary and laboratory) are listed below for reference.   Significant Diagnostic Studies:  Dg Chest 2 View  01/12/2014 CLINICAL DATA: Shortness of breath, nausea EXAM: CHEST 2 VIEW COMPARISON: January 11, 2014 FINDINGS: The heart size and mediastinal contours are within normal limits. There is no focal infiltrate, pulmonary edema, or pleural effusion. The visualized skeletal structures are stable. IMPRESSION: No active cardiopulmonary disease. Electronically Signed By:  Abelardo Diesel M.D. On: 01/12/2014 02:16  Dg Chest 2 View  01/11/2014 CLINICAL DATA: Shortness of breath EXAM: CHEST 2 VIEW COMPARISON: 04/27/2013 FINDINGS: The heart size and mediastinal contours are within normal limits. Both lungs are clear. The visualized skeletal structures are unremarkable. IMPRESSION: No active cardiopulmonary disease. Electronically Signed By: Inez Catalina M.D. On: 01/11/2014 16:08  Dg Lumbar Spine Complete  01/11/2014 CLINICAL DATA: Low back pain for several months. EXAM: LUMBAR SPINE - COMPLETE 4+ VIEW COMPARISON: None. FINDINGS: There is no evidence of lumbar spine fracture. Alignment is normal. Intervertebral disc spaces are maintained. IMPRESSION: Negative exam. Electronically Signed By: Inge Rise M.D. On: 01/11/2014 20:37  Ct Head Wo Contrast  01/12/2014 CLINICAL DATA: Headache and dizziness. Tremors. EXAM: CT HEAD WITHOUT CONTRAST TECHNIQUE: Contiguous axial images were obtained from the base of the skull through the vertex without intravenous contrast. COMPARISON: None. FINDINGS: No mass lesion, mass effect, midline shift, hydrocephalus, hemorrhage. No territorial ischemia or acute infarction. Paranasal sinuses appear within normal limits. Mastoid air cells clear. IMPRESSION: Negative CT head. Electronically Signed By: Dereck Ligas M.D. On: 01/12/2014 02:30  Ct Soft Tissue Neck W Contrast  01/12/2014 CLINICAL DATA: Throat pain. Headache, dizziness and tremors. Shortness of breath. EXAM: CT NECK WITH CONTRAST TECHNIQUE: Multidetector CT imaging of the neck was performed using the standard protocol following the bolus administration of intravenous contrast. CONTRAST: 79mL OMNIPAQUE IOHEXOL 300 MG/ML SOLN COMPARISON: CT HEAD W/O CM dated 01/12/2014; CT C SPINE W/O CM dated 09/01/2013; CT HEAD W/CM dated 09/01/2013 FINDINGS: Study is degraded by large amount of artifact from corpectomy and ACDF device extending from on C4 through C6 and terminating over T1. Allowing for this  artifact, the chest and thoracic inlet appear within normal limits. Small symmetric Level 2 lymph nodes are present which are probably reactive to upper were airway process. There is a small prevertebral effusion. There is no peritonsillar abscess. Bones demonstrate C2-C3 and C3-C4 degenerative disease which appears similar to the prior exam. No cervical spine fracture or or acute abnormality is identified. Mild adenoidal hypertrophy is present. IMPRESSION: 1. Study degraded by long segment cervical spine stabilization hardware. 2. Small prevertebral effusion, most commonly associated with upper respiratory infection. No abscess. 3. Symmetric bilateral level 2 lymphadenopathy is probably reactive. Electronically Signed By: Dereck Ligas M.D. On: 01/12/2014 02:34  Microbiology:   Review of Systems  Constitutional: Positive for fatigue. Negative for chills, activity change, appetite change and unexpected weight change.  HENT: Negative for congestion, mouth sores, rhinorrhea and sinus pressure.   Eyes: Negative for visual disturbance.  Respiratory: Negative for cough and chest tightness.   Gastrointestinal: Negative for nausea, vomiting and abdominal pain.  Genitourinary: Negative for frequency, difficulty urinating and vaginal pain.  Musculoskeletal: Positive for back pain and neck pain. Negative for gait problem.  Skin: Negative for pallor and rash.  Allergic/Immunologic: Negative for immunocompromised state.  Neurological: Positive for dizziness,  tremors, weakness and light-headedness. Negative for seizures, syncope, numbness and headaches.  Psychiatric/Behavioral: Negative for suicidal ideas, confusion and sleep disturbance. The patient is nervous/anxious.        Objective:   Physical Exam  Constitutional: She appears well-developed. No distress.  Obese  HENT:  Head: Normocephalic.  Right Ear: External ear normal.  Left Ear: External ear normal.  Nose: Nose normal.  Mouth/Throat:  Oropharynx is clear and moist.  Eyes: Conjunctivae are normal. Pupils are equal, round, and reactive to light. Right eye exhibits no discharge. Left eye exhibits no discharge.  Neck: Normal range of motion. Neck supple. No JVD present. No tracheal deviation present. No thyromegaly present.  Cardiovascular: Normal rate, regular rhythm and normal heart sounds.   Pulmonary/Chest: No stridor. No respiratory distress. She has no wheezes.  Abdominal: Soft. Bowel sounds are normal. She exhibits no distension and no mass. There is no tenderness. There is no rebound and no guarding.  Musculoskeletal: She exhibits tenderness (neck). She exhibits no edema.  Lymphadenopathy:    She has no cervical adenopathy.  Neurological: She displays normal reflexes. No cranial nerve deficit. She exhibits normal muscle tone. Coordination abnormal.  Head and hands tremor - much less dramatic (would disappear when distracted) Face tics Less dramatic ataxia and dysbalance  Dysarthric w/spastic speech  Skin: No rash noted. No erythema. No pallor.  Psychiatric: She has a normal mood and affect. Judgment and thought content normal.          Assessment & Plan:

## 2014-01-28 NOTE — Assessment & Plan Note (Signed)
Not using CPAP - pulm appt pending

## 2014-01-28 NOTE — Progress Notes (Signed)
Pre visit review using our clinic review tool, if applicable. No additional management support is needed unless otherwise documented below in the visit note. 

## 2014-01-28 NOTE — Assessment & Plan Note (Signed)
Continue with current prescription therapy as reflected on the Med list.  

## 2014-01-28 NOTE — Assessment & Plan Note (Signed)
Worse Start Omeprazole 20 mg qd ac

## 2014-02-02 ENCOUNTER — Encounter: Payer: Self-pay | Admitting: Neurology

## 2014-02-08 ENCOUNTER — Encounter: Payer: Self-pay | Admitting: Pulmonary Disease

## 2014-02-08 ENCOUNTER — Ambulatory Visit (INDEPENDENT_AMBULATORY_CARE_PROVIDER_SITE_OTHER): Payer: BC Managed Care – PPO | Admitting: Pulmonary Disease

## 2014-02-08 VITALS — BP 124/80 | HR 67 | Temp 99.3°F | Ht 64.0 in | Wt 220.6 lb

## 2014-02-08 DIAGNOSIS — G4733 Obstructive sleep apnea (adult) (pediatric): Secondary | ICD-10-CM

## 2014-02-08 DIAGNOSIS — Z9989 Dependence on other enabling machines and devices: Principal | ICD-10-CM

## 2014-02-08 NOTE — Patient Instructions (Signed)
Sleep study We will get you a new CPAP machine

## 2014-02-08 NOTE — Assessment & Plan Note (Signed)
Given excessive daytime somnolence, narrow pharyngeal exam, witnessed apneas & loud snoring, obstructive sleep apnea is very likely & an overnight polysomnogram will be scheduled as a split study. The pathophysiology of obstructive sleep apnea , it's cardiovascular consequences & modes of treatment including CPAP were discused with the patient in detail & they evidenced understanding. We will get her a new CPAP

## 2014-02-08 NOTE — Progress Notes (Signed)
Subjective:    Patient ID: Paula [name], female    DOB: January 28, 1961, 53 y.o.   MRN: [id number]  HPI  PCP - plotnikov  53 year old ex-smoker presents for evaluation of obstructive sleep apnea. She had a sleep study in 2002 and was placed on CPAP with a nasal mask, unknown settings, supplier-[facility]. She had good results with improvement in her somnolence and energy levels. She has noted mold in her humidifier and stopped using this over the last 2-3 weeks. She would like a newer machine. Epworth sleepiness score is 9/24. Husband accompanies her reports loud snoring and increased somnolence. Bedtime is around 10:30, sleep latency is minimal, she sleeps on her side with one pillow, has several awakenings  And denies post void sleep latency, is out of bed by 6:15 AM feeling tired with occasional headaches. She's gained about 10 pounds over the last 2 years. There is no history suggestive of cataplexy, sleep paralysis or parasomnias  She was hospitalized in 01/2014 for tremors and speech disorder, head CT negative in 09/2013 and 01/2014, was evaluated by neurology without clear diagnosis ? Dystonia   Past Medical History  Diagnosis Date  . Allergy   . Anemia   . Anxiety   . Depression   . GERD (gastroesophageal reflux disease)   . Heart murmur   . Hyperlipidemia   . Hypertension   . Thyroid disease     nodule  . Ulcer   . Diverticulosis   . Sleep apnea     on C-pap machine  . Arthritis   . Vertigo   . Hernia   . IBS (irritable bowel syndrome)   . Obesity    Past Surgical History  Procedure Laterality Date  . Cholecystectomy    . Cesarean section    . Cervical fusion    . Inguinal hernia repair    . Partial hysterectomy      Allergies  Allergen Reactions  . Aspirin Other (See Comments)    Has history of ulcers   . Hydrocodone-Acetaminophen Nausea And Vomiting  . Moxifloxacin Itching  . Oxycodone Nausea And Vomiting    History   Social History  .  Marital Status: Married    Spouse Name: N/A    Number of Children: N/A  . Years of Education: N/A   Occupational History  . Not on file.   Social History Main Topics  . Smoking status: Former Smoker -- 20 years    Quit date: 07/24/2008  . Smokeless tobacco: Never Used     Comment: 1 pack per week  . Alcohol Use: No  . Drug Use: No  . Sexual Activity: Yes    Birth Control/ Protection: Surgical   Other Topics Concern  . Not on file   Social History Narrative  . No narrative on file    Family History  Problem Relation Age of Onset  . Colon cancer Maternal Grandfather   . Hypertension Maternal Grandfather   . Cancer Maternal Grandfather     lung  . Hyperlipidemia Mother   . Hypertension Mother   . Hypertension Maternal Grandmother   . Hypertension Brother   . Hypertension Sister   . Cancer Maternal Aunt     lung     Review of Systems  Constitutional: Positive for activity change and unexpected weight change. Negative for fever.  HENT: Positive for ear pain, sore throat and trouble swallowing. Negative for congestion, dental problem, nosebleeds, postnasal drip, rhinorrhea, sinus pressure and sneezing.  Eyes: Negative for redness and itching.  Respiratory: Negative for cough, chest tightness, shortness of breath and wheezing.   Cardiovascular: Positive for chest pain. Negative for palpitations and leg swelling.  Gastrointestinal: Negative for nausea and vomiting.  Genitourinary: Negative for dysuria.  Musculoskeletal: Positive for joint swelling.  Skin: Negative for rash.  Neurological: Positive for headaches.  Hematological: Does not bruise/bleed easily.  Psychiatric/Behavioral: Positive for dysphoric mood. The patient is nervous/anxious.        Objective:   Physical Exam  Gen. Pleasant, obese, in no distress, normal affect ENT - no lesions, no post nasal drip, class 2-3 airway Neck: No JVD, no thyromegaly, no carotid bruits Lungs: no use of accessory  muscles, no dullness to percussion, decreased without rales or rhonchi  Cardiovascular: Rhythm regular, heart sounds  normal, no murmurs or gallops, no peripheral edema Abdomen: soft and non-tender, no hepatosplenomegaly, BS normal. Musculoskeletal: No deformities, no cyanosis or clubbing Neuro:  alert, non focal,halting speech, resting tremor +       Assessment & Plan:

## 2014-02-09 ENCOUNTER — Ambulatory Visit (INDEPENDENT_AMBULATORY_CARE_PROVIDER_SITE_OTHER): Payer: BC Managed Care – PPO | Admitting: Neurology

## 2014-02-09 ENCOUNTER — Encounter: Payer: Self-pay | Admitting: Neurology

## 2014-02-09 VITALS — BP 148/96 | HR 80 | Resp 20 | Ht 64.0 in | Wt 218.0 lb

## 2014-02-09 DIAGNOSIS — G252 Other specified forms of tremor: Secondary | ICD-10-CM | POA: Insufficient documentation

## 2014-02-09 DIAGNOSIS — F985 Adult onset fluency disorder: Secondary | ICD-10-CM

## 2014-02-09 DIAGNOSIS — R251 Tremor, unspecified: Secondary | ICD-10-CM

## 2014-02-09 DIAGNOSIS — F8081 Childhood onset fluency disorder: Secondary | ICD-10-CM

## 2014-02-09 DIAGNOSIS — R259 Unspecified abnormal involuntary movements: Secondary | ICD-10-CM

## 2014-02-09 NOTE — Patient Instructions (Signed)
1. We have scheduled you at [facility] for your MRI on 02/23/14 at 7:00 pm. Please arrive 15 minutes prior at [facility]. 2. We have scheduled you at [facility] for your EEG on 02/21/2014 at 3:30 pm. Please arrive 15 minutes prior and go to Admissions.  3. We will follow up with you based on the results of these tests.

## 2014-02-09 NOTE — Progress Notes (Signed)
Subjective:    Paula [name] was seen in consultation in the movement disorder clinic at the request of [name], MD.  The evaluation is for tremor.  She was seen for the same as as in patient and those records, including the neurology records, were reviewed.  The pt is accompanied by her sister who supplements the history.    The patient is a 53 y.o. right handed female with a history of tremor.  Pt reports that tremor started acutely in early Feb, 2015 after drinking a diet supplement that she made herself.   She states that it was just a combination of vinegar, cayenne pepper, lemon and water.  She had 2 dosages of it.  After the 2nd dose, she began to have acute onset of tremor in the head and her throat felt like it was caving in, as if she couldn't breathe.  She was near syncopal.  She went to the ER and was sent home.  2 days later, she went to UC because of the same issue and she was sent to the ER.  She was admitted.  Ddx was between some type of dystonia and possible psychogenic etiology.  She was given propranolol but that caused bradycardia and dizziness.  She was then started on "2 seizure medications."  They started on gabapentin and klonopin but she ran out of the klonopin on Friday.  She does think that they help some.  She had one episode yesterday where she felt that she could not breathe and prior to that she had not had a similar episode in 2 weeks.  Her sister states that she also noted new onset stuttering speech.  There is no hx of similar.  There is no family hx of tremor.    Affected by caffeine:  no Affected by alcohol:  Unknown - not drinking alcohol Affected by stress:  yes (states that husb has PTSD and she doesn't sleep much because of him sleep) Affected by fatigue:  no but at the end of the day she has bad headache Spills soup if on spoon:  no Spills glass of liquid if full:  no Affects ADL's (tying shoes, brushing teeth, etc):  no   Outside reports reviewed:  historical medical records and referral letter/letters.  Allergies  Allergen Reactions  . Aspirin Other (See Comments)    Has history of ulcers   . Hydrocodone-Acetaminophen Nausea And Vomiting  . Moxifloxacin Itching  . Oxycodone Nausea And Vomiting    Current Outpatient Prescriptions on File Prior to Visit  Medication Sig Dispense Refill  . acetaminophen (TYLENOL) 500 MG tablet Take 1,000 mg by mouth every 6 (six) hours as needed.      . cholecalciferol (VITAMIN D) 1000 UNITS tablet Take 1 tablet (1,000 Units total) by mouth daily.  100 tablet  3  . gabapentin (NEURONTIN) 100 MG capsule Take 1 capsule (100 mg total) by mouth 2 (two) times daily.  60 capsule  1  . omeprazole (PRILOSEC OTC) 20 MG tablet Take 20 mg by mouth as needed.      . clonazePAM (KLONOPIN) 0.25 MG disintegrating tablet Take 1-2 tablets (0.25-0.5 mg total) by mouth 2 (two) times daily as needed. Tremor, anxiety  60 tablet  2   No current facility-administered medications on file prior to visit.    Past Medical History  Diagnosis Date  . Allergy   . Anemia   . Anxiety   . Depression   . GERD (gastroesophageal reflux disease)   .  Heart murmur   . Hyperlipidemia   . Hypertension   . Thyroid disease     nodule  . Ulcer   . Diverticulosis   . Sleep apnea     on C-pap machine  . Arthritis   . Vertigo   . Hernia   . IBS (irritable bowel syndrome)   . Obesity   . Tremor   . Osteoarthritis   . Migraine   . Restless leg     Past Surgical History  Procedure Laterality Date  . Cholecystectomy    . Cesarean section    . Cervical fusion    . Inguinal hernia repair    . Partial hysterectomy      History   Social History  . Marital Status: Married    Spouse Name: N/A    Number of Children: N/A  . Years of Education: N/A   Occupational History  . Not on file.   Social History Main Topics  . Smoking status: Former Smoker -- 20 years    Quit date: 07/24/2008  . Smokeless tobacco: Never Used       Comment: 1 pack per week  . Alcohol Use: No  . Drug Use: No  . Sexual Activity: Yes    Birth Control/ Protection: Surgical   Other Topics Concern  . Not on file   Social History Narrative  . No narrative on file    Family Status  Relation Status Death Age  . Maternal Grandfather Deceased   . Mother Alive 45    hyperlipidemia  . Father Alive 70    healthy  . Maternal Aunt Deceased   . Sister Alive   . Brother Alive   . Son Alive   . Daughter Alive     Review of Systems States that she has L knee and L hip pain.  Has AM hand paresthesias and some AM hand swelling.   complete 10 system ROS was obtained and was negative apart from what is mentioned.   Objective:   VITALS:   Filed Vitals:   02/09/14 0902  BP: 148/96  Pulse: 80  Resp: 20  Height: 5\' 4"  (1.626 m)  Weight: 218 lb (98.884 kg)   Gen:  Appears stated age and in NAD. HEENT:  Normocephalic, atraumatic. The mucous membranes are moist. The superficial temporal arteries are without ropiness or tenderness. Cardiovascular: Regular rate and rhythm. Lungs: Clear to auscultation bilaterally. Neck: There are no carotid bruits noted bilaterally.  NEUROLOGICAL:  Orientation:  The patient is alert and oriented x 3.  Recent and remote memory are intact.  Attention span and concentration are normal.  Able to name objects and repeat without trouble.  Fund of knowledge is appropriate Cranial nerves: There is good facial symmetry. The pupils are equal round and reactive to light bilaterally. Fundoscopic exam reveals clear disc margins bilaterally. Extraocular muscles are intact and visual fields are full to confrontational testing. Speech is fluent at times, and at other times there is a stuttering quality to it.  No dysarthria.  It is clear. Soft palate rises symmetrically and there is no tongue deviation. Hearing is intact to conversational tone. Tone: Tone is good throughout. Sensation: Sensation is intact to light  touch and pinprick throughout (facial, trunk, extremities). Vibration is intact at the bilateral big toe. There is no extinction with double simultaneous stimulation. There is no sensory dermatomal level identified. Coordination:  The patient has no dysdiadichokinesia or dysmetria. Motor: Strength is 5/5 in the bilateral upper  and lower extremities.  Shoulder shrug is equal bilaterally.  There is no pronator drift.  There are no fasciculations noted. DTR's: Deep tendon reflexes are 3/4 at the bilateral biceps, triceps, brachioradialis, patella and 2+ at the bilateral achilles.  Plantar responses are downgoing bilaterally. Gait and Station: The patient is able to ambulate without difficulty. The patient is able to heel toe walk without any difficulty. The patient is able to ambulate in a tandem fashion. The patient is able to stand in the Romberg position.   MOVEMENT EXAM: Tremor:  There is a variable head tremor, sometime in the yes position, sometimes in the no position.    It is irregular, but the frequency and amplitude of the tremor change.  It abates with distraction (mathematics, naming of months in reverse order).  LABS:  Lab Results  Component Value Date   TSH 1.056 01/12/2014   Lab Results  Component Value Date   WBC 9.5 01/11/2014   HGB 12.7 01/11/2014   HCT 37.6 01/11/2014   MCV 87.9 01/11/2014   PLT 398 01/11/2014     Chemistry      Component Value Date/Time   NA 140 01/11/2014 1831   K 4.3 01/11/2014 1831   CL 100 01/11/2014 1831   CO2 25 01/11/2014 1831   BUN 10 01/11/2014 1831   CREATININE 0.81 01/11/2014 1831      Component Value Date/Time   CALCIUM 10.0 01/11/2014 1831   ALKPHOS 57 01/11/2014 1515   AST 59* 01/11/2014 1515   ALT 33 01/11/2014 1515   BILITOT 0.3 01/11/2014 1515     Lab Results  Component Value Date   VITAMINB12 388 01/12/2014        Assessment/Plan:   1.  Tremor.  -I suspect that this is psychogenic in nature.  It is irregular with changing amplitude  and frequency and abates with distraction.  We talked extensively about this.  Greater than 50% of the visit was spent in counseling.  We talked about the fact that many times these types of tremors are triggered by stress.  I think that her stuttering speech, likewise, is generated by stress.  I am going to go ahead and do an MRI of the brain given the fact that she does have hyperreflexia and I want to make sure that we are not missing anything.  She will also have an EEG, given the fact that she is having spells associated with these tremors, where she has shortness of breath and dizziness.  I wonder if there is not a component of panic attack with this, but again we will get an EEG just to make sure.  -I really did not change her medications today.  I did, however, tell her that if this turns out to be psychogenic tremor, then medication is not going to particularly help.  We'll talk about this with her further at next visit, depending on the results of the above.

## 2014-02-10 ENCOUNTER — Telehealth: Payer: Self-pay | Admitting: *Deleted

## 2014-02-10 NOTE — Telephone Encounter (Signed)
Pt brought in forms from when she was set up on CPAP previous. She was set at 9 cm 10/15/11. She reports Dr. Elsworth Soho wanted to know. Will forward as an [place]

## 2014-02-16 ENCOUNTER — Encounter (HOSPITAL_COMMUNITY): Payer: Self-pay | Admitting: Emergency Medicine

## 2014-02-16 ENCOUNTER — Emergency Department (HOSPITAL_COMMUNITY)
Admission: EM | Admit: 2014-02-16 | Discharge: 2014-02-16 | Disposition: A | Discharge status: Home / Self Care | Payer: BC Managed Care – PPO | Source: Home / Self Care | Attending: Family Medicine | Admitting: Family Medicine

## 2014-02-16 DIAGNOSIS — J040 Acute laryngitis: Secondary | ICD-10-CM

## 2014-02-16 LAB — POCT RAPID STREP A: Streptococcus, Group A Screen (Direct): NEGATIVE

## 2014-02-16 MED ORDER — IPRATROPIUM BROMIDE 0.06 % NA SOLN: 2.0000 | Freq: Four times a day (QID) | NASAL | Status: DC

## 2014-02-16 MED ORDER — PREDNISONE 50 MG PO TABS: 50.0000 mg | ORAL_TABLET | Freq: Every day | ORAL | Status: DC

## 2014-02-16 NOTE — Discharge Instructions (Signed)
Thank you for coming in today. Take prednisone daily. Use Atrovent nasal spray. Use over-the-counter Zyrtec. Avoid decongestants as this is causing your blood pressure to be elevated. Call your medical supply company. They may be able to get your machine fixed. Call or go to the emergency room if you get worse, have trouble breathing, have chest pains, or palpitations.   Laryngitis At the top of your windpipe is your voice box. It is the source of your voice. Inside your voice box are 2 bands of muscles called vocal cords. When you breathe, your vocal cords are relaxed and open so that air can get into the lungs. When you decide to say something, these cords come together and vibrate. The sound from these vibrations goes into your throat and comes out through your mouth as sound. Laryngitis is an inflammation of the vocal cords that causes hoarseness, cough, loss of voice, sore throat, and dry throat. Laryngitis can be temporary (acute) or long-term (chronic). Most cases of acute laryngitis improve with time.Chronic laryngitis lasts for more than 3 weeks. CAUSES Laryngitis can often be related to excessive smoking, talking, or yelling, as well as inhalation of toxic fumes and allergies. Acute laryngitis is usually caused by a viral infection, vocal strain, measles or mumps, or bacterial infections. Chronic laryngitis is usually caused by vocal cord strain, vocal cord injury, postnasal drip, growths on the vocal cords, or acid reflux. SYMPTOMS   Cough.  Sore throat.  Dry throat. RISK FACTORS  Respiratory infections.  Exposure to irritating substances, such as cigarette smoke, excessive amounts of alcohol, stomach acids, and workplace chemicals.  Voice trauma, such as vocal cord injury from shouting or speaking too loud. DIAGNOSIS  Your cargiver will perform a physical exam. During the physical exam, your caregiver will examine your throat. The most common sign of laryngitis is hoarseness.  Laryngoscopy may be necessary to confirm the diagnosis of this condition. This procedure allows your caregiver to look into the larynx. HOME CARE INSTRUCTIONS  Drink enough fluids to keep your urine clear or pale yellow.  Rest until you no longer have symptoms or as directed by your caregiver.  Breathe in moist air.  Take all medicine as directed by your caregiver.  Do not smoke.  Talk as little as possible (this includes whispering).  Write on paper instead of talking until your voice is back to normal.  Follow up with your caregiver if your condition has not improved after 10 days. SEEK MEDICAL CARE IF:   You have trouble breathing.  You cough up blood.  You have persistent fever.  You have increasing pain.  You have difficulty swallowing. MAKE SURE YOU:  Understand these instructions.  Will watch your condition.  Will get help right away if you are not doing well or get worse. Document Released: 11/18/2005 Document Revised: 02/10/2012 Document Reviewed: 01/24/2011 [facility] Patient Information 2014 [place], [place].

## 2014-02-16 NOTE — ED Provider Notes (Signed)
Paula [name] is a 53 y.o. female who presents to Urgent Care today for coarse voice, congestion cough and sore throat present for 3 days. This is also associated with a postnasal drip. She's tried multiple over-the-counter cough and cold medications which have not been very effective. No chest pains palpitations shortness of breath. Patient notes that she has not been using her CPAP machine recently because he has mold growing in it. She feels well otherwise.    Past Medical History  Diagnosis Date  . Allergy   . Anemia   . Anxiety   . Depression   . GERD (gastroesophageal reflux disease)   . Heart murmur   . Hyperlipidemia   . Hypertension   . Thyroid disease     nodule  . Ulcer   . Diverticulosis   . Sleep apnea     on C-pap machine  . Arthritis   . Vertigo   . Hernia   . IBS (irritable bowel syndrome)   . Obesity   . Tremor   . Osteoarthritis   . Migraine   . Restless leg    History  Substance Use Topics  . Smoking status: Former Smoker -- 20 years    Quit date: 07/24/2008  . Smokeless tobacco: Never Used     Comment: 1 pack per week  . Alcohol Use: No   ROS as above Medications: No current facility-administered medications for this encounter.   Current Outpatient Prescriptions  Medication Sig Dispense Refill  . acetaminophen (TYLENOL) 500 MG tablet Take 1,000 mg by mouth every 6 (six) hours as needed.      . cholecalciferol (VITAMIN D) 1000 UNITS tablet Take 1 tablet (1,000 Units total) by mouth daily.  100 tablet  3  . clonazePAM (KLONOPIN) 0.25 MG disintegrating tablet Take 1-2 tablets (0.25-0.5 mg total) by mouth 2 (two) times daily as needed. Tremor, anxiety  60 tablet  2  . gabapentin (NEURONTIN) 100 MG capsule Take 1 capsule (100 mg total) by mouth 2 (two) times daily.  60 capsule  1  . ipratropium (ATROVENT) 0.06 % nasal spray Place 2 sprays into both nostrils 4 (four) times daily.  15 mL  1  . omeprazole (PRILOSEC OTC) 20 MG tablet Take 20 mg by mouth as  needed.      . predniSONE (DELTASONE) 50 MG tablet Take 1 tablet (50 mg total) by mouth daily.  5 tablet  0    Exam:  BP 168/122  Pulse 94  Temp(Src) 99.5 F (37.5 C) (Oral)  Resp 19  SpO2 96% Gen: Well NAD HEENT: EOMI,  MMMPosterior pharyngeal cobblestoning. Tympanic membranes are normal appearing bilaterally. Clear nasal discharge.  Lungs: Normal work of breathing. CTABLSlight hoarse voice  Heart: RRR no MRG Abd: NABS, Soft. NT, ND Exts: Brisk capillary refill, warm and well perfused.    Assessment and Plan: 53 y.o. female with laryngitis. Plan to treat with prednisone and Atrovent nasal spray. Recommend also Zyrtec. Recommend patient contact her home health agency to get her CPAP machine serviced. Additionally she should follow with her primary care provider soon for her blood pressure.  Discussed warning signs or symptoms. Please see discharge instructions. Patient expresses understanding.    Gregor Hams, MD 02/16/14 [phone]

## 2014-02-16 NOTE — ED Notes (Signed)
Pt c/o cold sxs onset Sunday Sxs include: coughing, runny nose, HA, PND, fevers, nauseas Denies v/d Alert w/no signs of acute distress.

## 2014-02-18 LAB — CULTURE, GROUP A STREP

## 2014-02-21 ENCOUNTER — Ambulatory Visit (HOSPITAL_COMMUNITY)
Admission: RE | Admit: 2014-02-21 | Discharge: 2014-02-21 | Disposition: A | Discharge status: Home / Self Care | Payer: BC Managed Care – PPO | Source: Ambulatory Visit | Attending: Neurology | Admitting: Neurology

## 2014-02-21 DIAGNOSIS — R251 Tremor, unspecified: Secondary | ICD-10-CM

## 2014-02-21 DIAGNOSIS — R42 Dizziness and giddiness: Secondary | ICD-10-CM

## 2014-02-21 DIAGNOSIS — R259 Unspecified abnormal involuntary movements: Secondary | ICD-10-CM

## 2014-02-21 NOTE — Progress Notes (Signed)
EEG Completed; Results Pending  

## 2014-02-22 ENCOUNTER — Telehealth: Payer: Self-pay | Admitting: Neurology

## 2014-02-22 NOTE — Procedures (Signed)
TECHNICAL SUMMARY:  A 24 channel referential and bipolar montage EEG using the standard international 10-20 system was performed on the patient described as awake, drowsy and asleep.  The dominant background activity consists of 9-10 hertz activity seen most prominantly over the anterior head region.  The backgound activity is reactive to eye opening and closing procedures.  Low voltage fast (beta) activity is distributed symmetrically and maximally over the anterior head regions.  ACTIVATION:  Stepwise photic stimulation at 4-20 flashes per second was performed and did not elicit any abnormal waveforms.  Hyperventilation was performed for 3 minutes with good patient effort and produced slowing of the background, consistent with a normal HV response.  This resolved within 1 minute after HV was completed.  EPILEPTIFORM ACTIVITY:  There were no spikes, sharp waves or paroxysmal activity.  SLEEP:  Both stage 1 and 2 sleep were noted.   CARDIAC:  The EKG lead revealed a regular   sinus rhythm.  IMPRESSION:  This is a normal EEG for the patients stated age.  There were no focal, hemispheric or lateralizing features.  No epileptiform activity was recorded.  A normal EEG does not exclude the diagnosis of a seizure d/o.

## 2014-02-22 NOTE — Telephone Encounter (Signed)
Jade, please let pt know that her EEG is normal.  If she is still having the spells, we should proceed with a 24 hour ambulatory - Dr. Delice Lesch to read.

## 2014-02-23 ENCOUNTER — Ambulatory Visit (HOSPITAL_COMMUNITY)
Admission: RE | Admit: 2014-02-23 | Discharge: 2014-02-23 | Disposition: A | Discharge status: Home / Self Care | Payer: BC Managed Care – PPO | Source: Ambulatory Visit | Attending: Neurology | Admitting: Neurology

## 2014-02-23 DIAGNOSIS — R262 Difficulty in walking, not elsewhere classified: Secondary | ICD-10-CM | POA: Insufficient documentation

## 2014-02-23 DIAGNOSIS — R209 Unspecified disturbances of skin sensation: Secondary | ICD-10-CM | POA: Insufficient documentation

## 2014-02-23 DIAGNOSIS — R110 Nausea: Secondary | ICD-10-CM | POA: Insufficient documentation

## 2014-02-23 DIAGNOSIS — R259 Unspecified abnormal involuntary movements: Secondary | ICD-10-CM | POA: Insufficient documentation

## 2014-02-23 DIAGNOSIS — H539 Unspecified visual disturbance: Secondary | ICD-10-CM | POA: Insufficient documentation

## 2014-02-23 DIAGNOSIS — R251 Tremor, unspecified: Secondary | ICD-10-CM

## 2014-02-23 NOTE — Telephone Encounter (Signed)
Patient made aware EEG normal. She has had no additional spells. She will let me know if she does and we will order an ambulatory EEG. She is in agreement with this plan.

## 2014-02-25 ENCOUNTER — Ambulatory Visit (INDEPENDENT_AMBULATORY_CARE_PROVIDER_SITE_OTHER): Payer: BC Managed Care – PPO | Admitting: Internal Medicine

## 2014-02-25 ENCOUNTER — Telehealth: Payer: Self-pay | Admitting: Neurology

## 2014-02-25 ENCOUNTER — Encounter: Payer: Self-pay | Admitting: Internal Medicine

## 2014-02-25 VITALS — BP 130/96 | HR 76 | Temp 98.8°F | Resp 16 | Wt 215.0 lb

## 2014-02-25 DIAGNOSIS — G250 Essential tremor: Secondary | ICD-10-CM

## 2014-02-25 DIAGNOSIS — G4700 Insomnia, unspecified: Secondary | ICD-10-CM

## 2014-02-25 DIAGNOSIS — G252 Other specified forms of tremor: Secondary | ICD-10-CM

## 2014-02-25 DIAGNOSIS — F411 Generalized anxiety disorder: Secondary | ICD-10-CM

## 2014-02-25 DIAGNOSIS — R251 Tremor, unspecified: Secondary | ICD-10-CM

## 2014-02-25 MED ORDER — GABAPENTIN 100 MG PO CAPS: 100.0000 mg | ORAL_CAPSULE | Freq: Every day | ORAL | Status: DC

## 2014-02-25 NOTE — Telephone Encounter (Signed)
Patient made aware that MR normal. She will call with any problems.

## 2014-02-25 NOTE — Progress Notes (Signed)
Subjective:    HPI  F/up -- psychogenic tremor (Dr Tat) and throat spasms are a little better; memory is better. Pt came w/her dtr and she had an EEG and an MRI.  Discharge (01/13/14) Diagnoses:  Principal Problem:  Tremor of face and hands  Active Problems:  OBESITY, MORBID  ANXIETY  DEPRESSION  Dyspnea  Discharge Condition: Stable and improved  Filed Weights    01/12/14 0232  01/13/14 0515   Weight:  106.278 kg (234 lb 4.8 oz)  108.41 kg (239 lb)   History of present illness:  This is a 53 year old female with no reported prior medical history presenting with progressive tremor, weakness, dyspnea. (Preliminary H&P as Epic is down currently. Unable to obtain full history apart from patient's report.) Patient states that she's had persistent neck tremors as well as generalized shortness of breath since 3-4 days ago. No recent trauma. No infections. No chest pain. Patient states that she went to a local urgent care with presentation of symptoms. Workup was otherwise negative with recommendation the patient go to the ER for further evaluation. Patient states that the wait was too long so she went home. Patient states that she went back to the urgent care the next day and then was referred back to the E.R. In the ER thus far, workup has been essentially negative. Blood pressure stable. Lab work within normal limits. Predominantly, patient has been unable to ambulate with severe weakness. Weakness is mainly in the left side/left leg. Mild paresthesias.  Patient does report she's been having generalized weakness over the past 6 months. No weight loss or weight gain. Patient has been under high amount of stress. Denies any significant medical problems in the past however does report having neck surgery several years ago. No radicular symptoms down the arms. CT in the emergency room is currently pending. Hospital Course:  Tremor  -No etiology found: CT Head negative, TSH, B12 ok.  -Has been  evaluated by neurology who believe there is a component of a psychogenic tremor.  -No need for further inpatient work up.  -Was started on propranolol but this was discontinued as she became bradycardic and dizzy.  -Neurology has started low-dose neurontin.  Rest of chronic medical issues have been stable and home medications have not been altered.  Procedures:  None  Consultations:  Neurology Discharge Instructions      Discharge Orders    Future Appointments  Provider  Department  Dept Phone    01/20/2014 11:00 AM  Cassandria Anger, MD  [facility]  [phone]    Future Orders  Complete By  Expires    Discontinue IV  As directed     Increase activity slowly  As directed         Medication List         gabapentin 100 MG capsule    Commonly known as: NEURONTIN    Take 1 capsule (100 mg total) by mouth 2 (two) times daily.      Allergies   Allergen  Reactions   .  Aspirin  Other (See Comments)     Has history of ulcers   .  Hydrocodone-Acetaminophen  Nausea And Vomiting   .  Moxifloxacin  Itching   .  Oxycodone  Nausea And Vomiting    Follow-up Information    Follow up with [name], MD. Schedule an appointment as soon as possible for a visit in 1 week.    Specialty: Internal Medicine  Contact information:    [address]  [address]  [place] [address]  [phone]      The results of significant diagnostics from this hospitalization (including imaging, microbiology, ancillary and laboratory) are listed below for reference.   Significant Diagnostic Studies:  Dg Chest 2 View  01/12/2014 CLINICAL DATA: Shortness of breath, nausea EXAM: CHEST 2 VIEW COMPARISON: January 11, 2014 FINDINGS: The heart size and mediastinal contours are within normal limits. There is no focal infiltrate, pulmonary edema, or pleural effusion. The visualized skeletal structures are stable. IMPRESSION: No active cardiopulmonary disease.  Electronically Signed By: Abelardo Diesel M.D. On: 01/12/2014 02:16  Dg Chest 2 View  01/11/2014 CLINICAL DATA: Shortness of breath EXAM: CHEST 2 VIEW COMPARISON: 04/27/2013 FINDINGS: The heart size and mediastinal contours are within normal limits. Both lungs are clear. The visualized skeletal structures are unremarkable. IMPRESSION: No active cardiopulmonary disease. Electronically Signed By: Inez Catalina M.D. On: 01/11/2014 16:08  Dg Lumbar Spine Complete  01/11/2014 CLINICAL DATA: Low back pain for several months. EXAM: LUMBAR SPINE - COMPLETE 4+ VIEW COMPARISON: None. FINDINGS: There is no evidence of lumbar spine fracture. Alignment is normal. Intervertebral disc spaces are maintained. IMPRESSION: Negative exam. Electronically Signed By: Inge Rise M.D. On: 01/11/2014 20:37  Ct Head Wo Contrast  01/12/2014 CLINICAL DATA: Headache and dizziness. Tremors. EXAM: CT HEAD WITHOUT CONTRAST TECHNIQUE: Contiguous axial images were obtained from the base of the skull through the vertex without intravenous contrast. COMPARISON: None. FINDINGS: No mass lesion, mass effect, midline shift, hydrocephalus, hemorrhage. No territorial ischemia or acute infarction. Paranasal sinuses appear within normal limits. Mastoid air cells clear. IMPRESSION: Negative CT head. Electronically Signed By: Dereck Ligas M.D. On: 01/12/2014 02:30  Ct Soft Tissue Neck W Contrast  01/12/2014 CLINICAL DATA: Throat pain. Headache, dizziness and tremors. Shortness of breath. EXAM: CT NECK WITH CONTRAST TECHNIQUE: Multidetector CT imaging of the neck was performed using the standard protocol following the bolus administration of intravenous contrast. CONTRAST: 27mL OMNIPAQUE IOHEXOL 300 MG/ML SOLN COMPARISON: CT HEAD W/O CM dated 01/12/2014; CT C SPINE W/O CM dated 09/01/2013; CT HEAD W/CM dated 09/01/2013 FINDINGS: Study is degraded by large amount of artifact from corpectomy and ACDF device extending from on C4 through C6 and terminating over  T1. Allowing for this artifact, the chest and thoracic inlet appear within normal limits. Small symmetric Level 2 lymph nodes are present which are probably reactive to upper were airway process. There is a small prevertebral effusion. There is no peritonsillar abscess. Bones demonstrate C2-C3 and C3-C4 degenerative disease which appears similar to the prior exam. No cervical spine fracture or or acute abnormality is identified. Mild adenoidal hypertrophy is present. IMPRESSION: 1. Study degraded by long segment cervical spine stabilization hardware. 2. Small prevertebral effusion, most commonly associated with upper respiratory infection. No abscess. 3. Symmetric bilateral level 2 lymphadenopathy is probably reactive. Electronically Signed By: Dereck Ligas M.D. On: 01/12/2014 02:34  Microbiology:   Review of Systems  Constitutional: Positive for fatigue. Negative for chills, activity change, appetite change and unexpected weight change.  HENT: Negative for congestion, mouth sores, rhinorrhea and sinus pressure.   Eyes: Negative for visual disturbance.  Respiratory: Negative for cough and chest tightness.   Gastrointestinal: Negative for nausea, vomiting and abdominal pain.  Genitourinary: Negative for frequency, difficulty urinating and vaginal pain.  Musculoskeletal: Positive for back pain and neck pain. Negative for gait problem.  Skin: Negative for pallor and rash.  Allergic/Immunologic: Negative for immunocompromised state.  Neurological: Positive for dizziness, tremors, weakness and light-headedness. Negative for seizures, syncope, numbness and headaches.  Psychiatric/Behavioral: Negative for suicidal ideas, confusion and sleep disturbance. The patient is nervous/anxious.        Objective:   Physical Exam  Constitutional: She appears well-developed. No distress.  Obese  HENT:  Head: Normocephalic.  Right Ear: External ear normal.  Left Ear: External ear normal.  Nose: Nose normal.   Mouth/Throat: Oropharynx is clear and moist.  Eyes: Conjunctivae are normal. Pupils are equal, round, and reactive to light. Right eye exhibits no discharge. Left eye exhibits no discharge.  Neck: Normal range of motion. Neck supple. No JVD present. No tracheal deviation present. No thyromegaly present.  Cardiovascular: Normal rate, regular rhythm and normal heart sounds.   Pulmonary/Chest: No stridor. No respiratory distress. She has no wheezes.  Abdominal: Soft. Bowel sounds are normal. She exhibits no distension and no mass. There is no tenderness. There is no rebound and no guarding.  Musculoskeletal: She exhibits tenderness (neck). She exhibits no edema.  Lymphadenopathy:    She has no cervical adenopathy.  Neurological: She displays normal reflexes. No cranial nerve deficit. She exhibits normal muscle tone. Coordination abnormal.  Head and hands tremor - much less dramatic (would disappear when distracted) - better Face tics - resolved Less dramatic ataxia and dysbalance - much better Dysarthric w/spastic speech - resolved  Skin: No rash noted. No erythema. No pallor.  Psychiatric: She has a normal mood and affect. Judgment and thought content normal.    EEG MRI      Assessment & Plan:

## 2014-02-25 NOTE — Assessment & Plan Note (Signed)
Continue with current prescription therapy as reflected on the Med list.  

## 2014-02-25 NOTE — Assessment & Plan Note (Signed)
S/p eval by Dr Tat  EEG and brain MRI were normal Psychogenic tremor. Doing better. Psychology ref

## 2014-02-25 NOTE — Telephone Encounter (Signed)
Message copied by Annamaria Helling on Fri Feb 25, 2014  8:14 AM ------      Message from: TAT, REBECCA S      Created: Fri Feb 25, 2014  8:03 AM       Very few T2 hyperintensities.  Jade, please let pt know that her MRI looks good ------

## 2014-02-25 NOTE — Progress Notes (Signed)
Pre visit review using our clinic review tool, if applicable. No additional management support is needed unless otherwise documented below in the visit note. 

## 2014-02-28 ENCOUNTER — Ambulatory Visit (HOSPITAL_BASED_OUTPATIENT_CLINIC_OR_DEPARTMENT_OTHER): Payer: BC Managed Care – PPO | Attending: Pulmonary Disease | Admitting: Radiology

## 2014-02-28 VITALS — Ht 64.0 in | Wt 215.0 lb

## 2014-02-28 DIAGNOSIS — R0989 Other specified symptoms and signs involving the circulatory and respiratory systems: Secondary | ICD-10-CM | POA: Insufficient documentation

## 2014-02-28 DIAGNOSIS — Z9989 Dependence on other enabling machines and devices: Secondary | ICD-10-CM

## 2014-02-28 DIAGNOSIS — R0609 Other forms of dyspnea: Secondary | ICD-10-CM | POA: Insufficient documentation

## 2014-02-28 DIAGNOSIS — G4733 Obstructive sleep apnea (adult) (pediatric): Secondary | ICD-10-CM

## 2014-02-28 DIAGNOSIS — R0683 Snoring: Secondary | ICD-10-CM

## 2014-02-28 NOTE — ED Provider Notes (Signed)
Angiocath insertion Performed by: Sherwood Gambler T  Consent: Verbal consent obtained. Risks and benefits: risks, benefits and alternatives were discussed Time out: Immediately prior to procedure a "time out" was called to verify the correct patient, procedure, equipment, support staff and site/side marked as required.  Preparation: Patient was prepped and draped in the usual sterile fashion.  Vein Location: right basilic  Ultrasound Guided  Gauge: 20  Normal blood return and flush without difficulty Patient tolerance: Patient tolerated the procedure well with no immediate complications.  **This is an addendum to add procedure that was performed on 01/11/14  Ephraim Hamburger, MD 02/28/14 [phone]

## 2014-03-01 ENCOUNTER — Telehealth: Payer: Self-pay | Admitting: Pulmonary Disease

## 2014-03-01 DIAGNOSIS — G4733 Obstructive sleep apnea (adult) (pediatric): Secondary | ICD-10-CM

## 2014-03-01 DIAGNOSIS — Z9989 Dependence on other enabling machines and devices: Principal | ICD-10-CM

## 2014-03-01 DIAGNOSIS — R0989 Other specified symptoms and signs involving the circulatory and respiratory systems: Secondary | ICD-10-CM

## 2014-03-01 DIAGNOSIS — G4730 Sleep apnea, unspecified: Secondary | ICD-10-CM

## 2014-03-01 DIAGNOSIS — G4710 Hypersomnia, unspecified: Secondary | ICD-10-CM

## 2014-03-01 DIAGNOSIS — R0609 Other forms of dyspnea: Secondary | ICD-10-CM

## 2014-03-01 NOTE — Telephone Encounter (Signed)
Based on PSG, order sent DME for new CPAP -Auto CPAP with medium-size pillows, download and follow up in one month

## 2014-03-01 NOTE — Sleep Study (Signed)
[place]   NAME: Paula [name]  DATE OF BIRTH: 05-17-1961  MEDICAL RECORD [id number]  LOCATION: [facility]   PHYSICIAN: Shahmeer Bunn V.   DATE OF STUDY: 02/28/14   SLEEP STUDY TYPE: Nocturnal Polysomnogram   REFERRING PHYSICIAN: Rigoberto Noel, MD   INDICATION FOR STUDY:  53 year old woman for reevaluation of obstructive sleep apnea, she has been maintained on CPAP since 2002.At the time of this study ,they weighed 215 pounds with a height of 5 ft 4 inches and the BMI of 37, neck size of 14 inches. Epworth sleepiness score was 5   This nocturnal polysomnogram was performed with a sleep technologist in attendance. EEG, EOG,EMG and respiratory parameters recorded. Sleep stages, arousals, limb movements and respiratory data was scored according to criteria laid out by the American Academy of sleep medicine.   SLEEP ARCHITECTURE: Lights out was at 2232 PM and lights on was at 505 AM. Total sleep time was 336 minutes with a sleep period time of 387 minutes and a sleep efficiency of 86 %. Sleep latency was 5 minutes with latency to REM sleep of 64 minutes and wake after sleep onset of 51 minutes. . Sleep stages as a percentage of total sleep time was N1 -8.6 %,N2- 74 % and REM sleep 17.5 % ( 59 minutes) . The longest period of REM sleep was around 3 AM.   AROUSAL DATA : There were 161  arousals with an arousal index of 29 events per hour. Most of these were spontaneous & 21 were associated with respiratory events  RESPIRATORY DATA: There were for 45 obstructive apneas, 0 central apneas, 0 mixed apneas and 0 hypopneas with apnea -hypopnea index of 8 events per hour. There were 68 RERAs with an RDI of  20 events per hour. There was no relation to sleep stage or body position. Supine sleep was noted  MOVEMENT/PARASOMNIA: There were 0 PLMS with a PLM index of 0 events per hour. The PLM arousal index was 0 per hour.  OXYGEN DATA: The lowest  desaturation was 79 % during REM sleep   CARDIAC DATA: The low heart rate was 35 beats per minute. The high heart rate recorded was an artifact. No arrhythmias were noted  DISCUSSION -Loud snoring was noted . She did not meet criteria for CPAP intervention. She was desensitized with a medium-size pillows  IMPRESSION :  1. moderate obstructive sleep apnea with hypopneas causing sleep fragmentation and moderate oxygen desaturation. Events were mostly noted during REM sleep. 2. No evidence of cardiac arrhythmias,periodic limb movements or behavioral disturbance during sleep.  3. Sleep efficiency was good  RECOMMENDATION:  1. Treatment options for this degree of sleep disordered breathing include weight loss, CPAP therapy and/ or oral appliance.  2. Patient should be cautioned against driving when sleepy  3. They should be asked to avoid medications with sedative side effects    Moni Rothrock V.  Diplomate, [employer] adviser of Sleep Medicine    ELECTRONICALLY SIGNED ON: 03/01/2014  [facility]  PH: [phone] FX: [phone]) [place]

## 2014-03-01 NOTE — Telephone Encounter (Signed)
I spoke with patient about results and she verbalized understanding and had no questions 

## 2014-03-01 NOTE — Telephone Encounter (Signed)
Rigoberto Noel, MD at 03/01/2014 12:20 PM     Status: Signed        Based on PSG, order sent DME for new CPAP  -Auto CPAP with medium-size pillows, download and follow up in one month  --  I spoke with patient about results and she verbalized understanding and had no questions. Order was already placed. Nothing further needed

## 2014-03-08 ENCOUNTER — Telehealth: Payer: Self-pay | Admitting: Neurology

## 2014-03-08 ENCOUNTER — Telehealth: Payer: Self-pay | Admitting: *Deleted

## 2014-03-08 DIAGNOSIS — F444 Conversion disorder with motor symptom or deficit: Secondary | ICD-10-CM

## 2014-03-08 NOTE — Telephone Encounter (Signed)
Pt called upset that she has not been called by psychiatry.  After review of chart I see an order placed for Psychology.  Order did not cross over per [facility].  Please advise

## 2014-03-08 NOTE — Telephone Encounter (Signed)
Patient wants referral to psychologist for psychogenic tremor. Made her aware I will check with our resource to see if they accept her insurance and make the referral.

## 2014-03-08 NOTE — Telephone Encounter (Signed)
Pt has some questions please call [phone]

## 2014-03-09 ENCOUNTER — Telehealth: Payer: Self-pay | Admitting: Pulmonary Disease

## 2014-03-09 NOTE — Telephone Encounter (Signed)
Pt returned call

## 2014-03-09 NOTE — Telephone Encounter (Signed)
Referral faxed to Dr Tobey Bride at [phone] - they will contact patient to schedule appt. They are out of network for insurance but will file a claim for patients.

## 2014-03-09 NOTE — Telephone Encounter (Signed)
It was meant for Psychology. Has it been arranged? Thx

## 2014-03-09 NOTE — Telephone Encounter (Signed)
Spoke with pt advised of MDs message 

## 2014-03-09 NOTE — Telephone Encounter (Signed)
Called [facility] [phone], was advised the therapists that was setting pt up with running a little behind with another patient.  They have already spoken w/ pt since she called [place]. I called pt to confirm-lmtcb x1

## 2014-03-09 NOTE — Telephone Encounter (Signed)
Spoke with the pt and confirmed that she has been taken care of by [facility] Nothing further needed per pt

## 2014-03-13 ENCOUNTER — Encounter (HOSPITAL_BASED_OUTPATIENT_CLINIC_OR_DEPARTMENT_OTHER): Payer: BC Managed Care – PPO

## 2014-03-18 ENCOUNTER — Telehealth: Payer: Self-pay | Admitting: Neurology

## 2014-03-18 DIAGNOSIS — R251 Tremor, unspecified: Secondary | ICD-10-CM

## 2014-03-18 NOTE — Telephone Encounter (Signed)
Lm on VM, would like a return call from Dr. Carles Collet. CB# [phone] / Sherri S.

## 2014-03-18 NOTE — Telephone Encounter (Signed)
Spoke with patient and she wants to proceed with ambulatory EEG. Appt scheduled at [facility] for 03/30/2014 at 2:00 pm. Patient aware.

## 2014-03-25 ENCOUNTER — Ambulatory Visit (INDEPENDENT_AMBULATORY_CARE_PROVIDER_SITE_OTHER): Payer: BC Managed Care – PPO | Admitting: Licensed Clinical Social Worker

## 2014-03-25 DIAGNOSIS — F3189 Other bipolar disorder: Secondary | ICD-10-CM

## 2014-03-30 ENCOUNTER — Ambulatory Visit (HOSPITAL_COMMUNITY)
Admission: RE | Admit: 2014-03-30 | Discharge: 2014-03-30 | Disposition: A | Discharge status: Home / Self Care | Payer: BC Managed Care – PPO | Source: Ambulatory Visit | Attending: Neurology | Admitting: Neurology

## 2014-03-30 DIAGNOSIS — R251 Tremor, unspecified: Secondary | ICD-10-CM

## 2014-03-30 DIAGNOSIS — R259 Unspecified abnormal involuntary movements: Secondary | ICD-10-CM | POA: Insufficient documentation

## 2014-03-30 DIAGNOSIS — R569 Unspecified convulsions: Secondary | ICD-10-CM

## 2014-03-30 NOTE — Progress Notes (Signed)
Ambulatory EEG set up

## 2014-03-31 NOTE — Progress Notes (Signed)
Pt returned; AEEG uploaded.  Dr Tat e-mailed that study is  Completed.

## 2014-04-04 ENCOUNTER — Ambulatory Visit (INDEPENDENT_AMBULATORY_CARE_PROVIDER_SITE_OTHER): Payer: BC Managed Care – PPO | Admitting: Licensed Clinical Social Worker

## 2014-04-04 ENCOUNTER — Ambulatory Visit: Payer: BC Managed Care – PPO | Admitting: Licensed Clinical Social Worker

## 2014-04-04 DIAGNOSIS — F3189 Other bipolar disorder: Secondary | ICD-10-CM

## 2014-04-05 NOTE — Procedures (Signed)
ELECTROENCEPHALOGRAM REPORT  Dates of Recording: 03/30/2014 to 03/31/2014  Patient's Name: Paula [name] MRN: [id number] Date of Birth: [birth year]  Referring Provider: Dr. Wells Guiles Tat  Procedure: 24-hour ambulatory EEG  History: This is a 53 year old woman with tremors, some associated with episodes of shortness of breath and dizziness.  EEG for classification.  Medications: Clonazepam, Neurontin  Technical Summary: This is a 24-hour multichannel digital EEG recording measured by the international 10-20 system with electrodes applied with paste and impedances below 5000 ohms performed as portable with EKG monitoring.  The digital EEG was referentially recorded, reformatted, and digitally filtered in a variety of bipolar and referential montages for optimal display.    DESCRIPTION OF RECORDING: During maximal wakefulness, the background activity consisted of a symmetric 9.5 Hz posterior dominant rhythm which was reactive to eye opening.  There is occasional focal 5-6 Hz theta slowing seen over the left temporal region, at times sharply contoured without clear epileptogenic potential.  There were no clear epileptiform discharges seen.  During the recording, the patient progresses through wakefulness, drowsiness, and Stage 2 sleep.  Similar occasional focal theta slowing is seen over the left temporal region.  There are occasional sharply contoured wave forms over the bilateral temporal regions noted, consistent with wicket spikes, a normal variant with no pathological significance.   Again, there were no clear epileptiform discharges seen.  Events: The patient did not complete symptom diary. There were several push button events with no associated EEG changes noted.  There were no electrographic seizures seen.  EKG lead was unremarkable.  IMPRESSION: This 24-hour ambulatory EEG study is abnormal due to occasional focal slowing over the left temporal region.  CLINICAL CORRELATION of the  above findings indicates focal cerebral dysfunction over the left temporal region suggestive of underlying structural or physiologic abnormality.  There were no clear epileptiform discharges or electrographic seizures seen.  Patient did not complete diary, push button events did not show electrographic correlate.   Ellouise Newer, M.D.

## 2014-04-11 ENCOUNTER — Telehealth: Payer: Self-pay | Admitting: Neurology

## 2014-04-11 DIAGNOSIS — R9401 Abnormal electroencephalogram [EEG]: Secondary | ICD-10-CM

## 2014-04-11 NOTE — Telephone Encounter (Signed)
Patient made aware of results. MR brain with contrast ordered. Scheduled 04/18/14 at 8:00 pm - arrive 7:45pm. Patient aware.

## 2014-04-11 NOTE — Telephone Encounter (Signed)
Message copied by Annamaria Helling on Mon Apr 11, 2014 10:29 AM ------      Message from: TAT, Achille S      Created: Mon Apr 11, 2014  8:16 AM       Luvenia Starch, please let pt know that EEG didn't show any seizures but did show a little slowing of brain waves over the left side and last MRI was done without contrast.  Should probably send back with contrast if patient agreeable.. ------

## 2014-04-12 ENCOUNTER — Telehealth: Payer: Self-pay | Admitting: Neurology

## 2014-04-12 MED ORDER — PREDNISONE 50 MG PO TABS: ORAL_TABLET | ORAL | Status: DC

## 2014-04-12 NOTE — Addendum Note (Signed)
Addended byLoma Boston, Luvenia Starch L on: 04/12/2014 12:00 PM   Modules accepted: Orders, Medications

## 2014-04-12 NOTE — Telephone Encounter (Signed)
Called patient and went over EEG results again. She has MR scheduled. She let me know she does get a rash from contrast. Patient made aware to take Prednisone 50 mg - 1 tablet 1 hour prior to study and Benadryl 50 mg - 1 tablet 1 hour prior to study per [facility] protocol. We will call with results of MR.

## 2014-04-12 NOTE — Telephone Encounter (Signed)
Is she sure that she gets rash with MRI contrast and not just CT contrast?  They are really different.

## 2014-04-12 NOTE — Telephone Encounter (Signed)
Pt wants to know about the results of the eeg  Please call [phone]

## 2014-04-12 NOTE — Telephone Encounter (Signed)
Called patient and she actually had reaction to the CT contrast. Aware not to take the prednisone or benadryl. She expressed understanding.

## 2014-04-18 ENCOUNTER — Ambulatory Visit (HOSPITAL_COMMUNITY)
Admission: RE | Admit: 2014-04-18 | Discharge: 2014-04-18 | Disposition: A | Discharge status: Home / Self Care | Payer: BC Managed Care – PPO | Source: Ambulatory Visit | Attending: Neurology | Admitting: Neurology

## 2014-04-18 ENCOUNTER — Other Ambulatory Visit: Payer: Self-pay | Admitting: Neurology

## 2014-04-18 DIAGNOSIS — R259 Unspecified abnormal involuntary movements: Secondary | ICD-10-CM | POA: Insufficient documentation

## 2014-04-18 DIAGNOSIS — R9401 Abnormal electroencephalogram [EEG]: Secondary | ICD-10-CM

## 2014-04-18 DIAGNOSIS — R569 Unspecified convulsions: Secondary | ICD-10-CM | POA: Insufficient documentation

## 2014-04-18 MED ORDER — GADOBENATE DIMEGLUMINE 529 MG/ML IV SOLN
20.0000 mL | Freq: Once | INTRAVENOUS | Status: AC | PRN
  Administered 2014-04-18: 20 mL via INTRAVENOUS

## 2014-04-19 ENCOUNTER — Telehealth: Payer: Self-pay | Admitting: Neurology

## 2014-04-19 NOTE — Telephone Encounter (Signed)
Message copied by Annamaria Helling on Tue Apr 19, 2014 10:43 AM ------      Message from: TAT, [place]: Tue Apr 19, 2014  8:23 AM       Reviewed.  Please let pt know that her MRI is normal.  She may make appt if she would like to further discuss ------

## 2014-04-19 NOTE — Telephone Encounter (Signed)
Patient made aware MR normal. Appt made for follow up per patient request.

## 2014-04-20 ENCOUNTER — Ambulatory Visit (INDEPENDENT_AMBULATORY_CARE_PROVIDER_SITE_OTHER): Payer: Medicare Other | Admitting: Pulmonary Disease

## 2014-04-20 ENCOUNTER — Encounter: Payer: Self-pay | Admitting: Pulmonary Disease

## 2014-04-20 VITALS — BP 124/78 | HR 74 | Wt 225.4 lb

## 2014-04-20 DIAGNOSIS — Z9989 Dependence on other enabling machines and devices: Principal | ICD-10-CM

## 2014-04-20 DIAGNOSIS — G4733 Obstructive sleep apnea (adult) (pediatric): Secondary | ICD-10-CM

## 2014-04-20 NOTE — Patient Instructions (Signed)
CPAP is very effective -continue same settings Take ZYRTEC daily  If cough persists, ok to take robitussin DM twice daily- call if worse

## 2014-04-20 NOTE — Assessment & Plan Note (Signed)
CPAP is very effective -continue same settings Weight loss encouraged, compliance with goal of at least 4-6 hrs every night is the expectation. Advised against medications with sedative side effects Cautioned against driving when sleepy - understanding that sleepiness will vary on a day to day basis  Take ZYRTEC daily  If cough persists, ok to take robitussin DM twice daily- call if worse

## 2014-04-20 NOTE — Progress Notes (Signed)
   Subjective:    Patient ID: Paula [name], female    DOB: [birth year], 53 y.o.   MRN: [id number]  HPI PCP - plotnikov   53 year old ex-smoker presents for FU of obstructive sleep apnea.  She had a sleep study in 2002 and was placed on CPAP 9cm with a nasal mask, unknown settings, supplier-[facility]. She had good results with improvement in her somnolence and energy levels. She has noted mold in her humidifier and stopped using this over the last 2-3 weeks. She would like a newer machine.  Epworth sleepiness score is 9/24.   She was hospitalized in 01/2014 for tremors and speech disorder, head CT negative in 09/2013 and 01/2014, was evaluated by neurology without clear diagnosis ? Dystonia  Chief Complaint  Patient presents with  . Follow-up    w/ download. Pt reports she is sleeping much better with CPAP.     PSG 01/2014  - AHI 8/h, RDI 2/h , lowest 79% Placed on autoCPAP with pillows ([facility]) Gained from 213 to 225 lbs  Download 04/2014 - no residuals, avg pr 11 cm, good usage >7h, no leak C/o dry hacking cough  Review of Systems neg for any significant sore throat, dysphagia, itching, sneezing, nasal congestion or excess/ purulent secretions, fever, chills, sweats, unintended wt loss, pleuritic or exertional cp, hempoptysis, orthopnea pnd or change in chronic leg swelling. Also denies presyncope, palpitations, heartburn, abdominal pain, nausea, vomiting, diarrhea or change in bowel or urinary habits, dysuria,hematuria, rash, arthralgias, visual complaints, headache, numbness weakness or ataxia.     Objective:   Physical Exam  Gen. Pleasant, obese, in no distress ENT - no lesions, no post nasal drip, no thrush Neck: No JVD, no thyromegaly, no carotid bruits Lungs: no use of accessory muscles, no dullness to percussion, decreased without rales or rhonchi  Cardiovascular: Rhythm regular, heart sounds  normal, no murmurs or gallops, no peripheral edema Musculoskeletal: No  deformities, no cyanosis or clubbing , no tremors       Assessment & Plan:

## 2014-04-21 ENCOUNTER — Ambulatory Visit (INDEPENDENT_AMBULATORY_CARE_PROVIDER_SITE_OTHER): Payer: BC Managed Care – PPO | Admitting: Neurology

## 2014-04-21 ENCOUNTER — Telehealth: Payer: Self-pay | Admitting: Neurology

## 2014-04-21 ENCOUNTER — Encounter: Payer: Self-pay | Admitting: Neurology

## 2014-04-21 VITALS — BP 136/80 | HR 60 | Resp 16 | Ht 64.0 in | Wt 224.0 lb

## 2014-04-21 DIAGNOSIS — F411 Generalized anxiety disorder: Secondary | ICD-10-CM

## 2014-04-21 DIAGNOSIS — F419 Anxiety disorder, unspecified: Secondary | ICD-10-CM

## 2014-04-21 DIAGNOSIS — R9401 Abnormal electroencephalogram [EEG]: Secondary | ICD-10-CM

## 2014-04-21 DIAGNOSIS — F444 Conversion disorder with motor symptom or deficit: Secondary | ICD-10-CM

## 2014-04-21 DIAGNOSIS — F459 Somatoform disorder, unspecified: Secondary | ICD-10-CM

## 2014-04-21 DIAGNOSIS — F458 Other somatoform disorders: Secondary | ICD-10-CM

## 2014-04-21 NOTE — Telephone Encounter (Signed)
Referral placed in EPIC and LMOM at [facility] to call back to schedule appt.

## 2014-04-21 NOTE — Progress Notes (Signed)
Subjective:    Paula [name] was seen in consultation in the movement disorder clinic at the request of [name], MD.  The evaluation is for tremor.  She was seen for the same as as in patient and those records, including the neurology records, were reviewed.  The pt is accompanied by her sister who supplements the history.    The patient is a 53 y.o. right handed female with a history of tremor.  Pt reports that tremor started acutely in early Feb, 2015 after drinking a diet supplement that she made herself.   She states that it was just a combination of vinegar, cayenne pepper, lemon and water.  She had 2 dosages of it.  After the 2nd dose, she began to have acute onset of tremor in the head and her throat felt like it was caving in, as if she couldn't breathe.  She was near syncopal.  She went to the ER and was sent home.  2 days later, she went to UC because of the same issue and she was sent to the ER.  She was admitted.  Ddx was between some type of dystonia and possible psychogenic etiology.  She was given propranolol but that caused bradycardia and dizziness.  She was then started on "2 seizure medications."  They started on gabapentin and klonopin but she ran out of the klonopin on Friday.  She does think that they help some.  She had one episode yesterday where she felt that she could not breathe and prior to that she had not had a similar episode in 2 weeks.  Her sister states that she also noted new onset stuttering speech.  There is no hx of similar.  There is no family hx of tremor.    04/21/14 update:   Pt is f/u today for testing results.  Her MRI brain with and without gad was normal.  Her routine EEG was normal.  Her ambulatory EEG did demonstrate L temporal slowing, without epileptiform activity.  This is nonspecific but there were no structural lesions on MRI to account for the changes on EEG.  Pt states that she is "some" better than she was but about the same.  She is seeing  behavioral health but only been there twice and the pt was frustrated.  After 2 sessions the pt was dx with bipolar and there was a recommendation to the PCP to start medication and the pt states that she hasn't yet been back yet to her PCP.  She cx appt tomorrow with behavioral health as she just didn't feel that she had a good connection with the counselor.  She called the place in [place] that saw conversion but it was very expensive and she couldn't afford it.   She watches 2 babies and states that her husband "drives her crazy" as he deals with PTSD.   She doesn't feel that he supports her in her struggles and illness, however.  Some days she speaks well and some days she is not able to speak well.  She does notice that as she becomes more stressed, the speech and tremor get worse.  She has knee pain on the L and hip pain on the L and so she continues to use the cane.  She is not taking time for herself.  She has noted deterioration of memory.   Outside reports reviewed: historical medical records and referral letter/letters.  Allergies  Allergen Reactions  . Aspirin Other (See Comments)    Has  history of ulcers   . Hydrocodone-Acetaminophen Nausea And Vomiting  . Moxifloxacin Itching  . Oxycodone Nausea And Vomiting    Current Outpatient Prescriptions on File Prior to Visit  Medication Sig Dispense Refill  . acetaminophen (TYLENOL) 500 MG tablet Take 1,000 mg by mouth every 6 (six) hours as needed.      . cholecalciferol (VITAMIN D) 1000 UNITS tablet Take 1 tablet (1,000 Units total) by mouth daily.  100 tablet  3  . gabapentin (NEURONTIN) 100 MG capsule Take 1-2 capsules (100-200 mg total) by mouth at bedtime.  60 capsule  5  . ipratropium (ATROVENT) 0.06 % nasal spray Place 2 sprays into both nostrils 4 (four) times daily.  15 mL  1   No current facility-administered medications on file prior to visit.    Past Medical History  Diagnosis Date  . Allergy   . Anemia   . Anxiety   .  Depression   . GERD (gastroesophageal reflux disease)   . Heart murmur   . Hyperlipidemia   . Hypertension   . Thyroid disease     nodule  . Ulcer   . Diverticulosis   . Sleep apnea     on C-pap machine  . Arthritis   . Vertigo   . Hernia   . IBS (irritable bowel syndrome)   . Obesity   . Tremor   . Osteoarthritis   . Migraine   . Restless leg     Past Surgical History  Procedure Laterality Date  . Cholecystectomy    . Cesarean section    . Cervical fusion    . Inguinal hernia repair    . Partial hysterectomy      History   Social History  . Marital Status: Married    Spouse Name: N/A    Number of Children: N/A  . Years of Education: N/A   Occupational History  . Not on file.   Social History Main Topics  . Smoking status: Former Smoker -- 20 years    Quit date: 07/24/2008  . Smokeless tobacco: Never Used     Comment: 1 pack per week  . Alcohol Use: No  . Drug Use: No  . Sexual Activity: Yes    Birth Control/ Protection: Surgical   Other Topics Concern  . Not on file   Social History Narrative  . No narrative on file    Family Status  Relation Status Death Age  . Maternal Grandfather Deceased   . Mother Alive 53    hyperlipidemia  . Father Alive 37    healthy  . Maternal Aunt Deceased   . Sister Alive   . Brother Alive   . Son Alive   . Daughter Alive     Review of Systems States that she has L knee and L hip pain.  Has AM hand paresthesias and some AM hand swelling.   complete 10 system ROS was obtained and was negative apart from what is mentioned.   Objective:   VITALS:   Filed Vitals:   04/21/14 1048  BP: 136/80  Pulse: 60  Resp: 16  Height: 5\' 4"  (1.626 m)  Weight: 224 lb (101.606 kg)   Gen:  Appears stated age and in NAD.   NEUROLOGICAL:  Orientation:  The patient is alert and oriented x 3.  Recent and remote memory are intact.  Attention span and concentration are normal.  Able to name objects and repeat without  trouble.  Fund of knowledge is  appropriate Cranial nerves: There is good facial symmetry. The pupils are equal round and reactive to light bilaterally. Fundoscopic exam reveals clear disc margins bilaterally. Extraocular muscles are intact and visual fields are full to confrontational testing. Speech is fluent at times, and at other times there is a stuttering quality to it.  No dysarthria.  It is clear. Soft palate rises symmetrically and there is no tongue deviation. Hearing is intact to conversational tone. Tone: Tone is good throughout. Coordination:  The patient has no dysdiadichokinesia or dysmetria. Motor: Strength is 5/5 in the bilateral upper and lower extremities.  Shoulder shrug is equal bilaterally.  There is no pronator drift.  There are no fasciculations noted. Gait and Station: The patient uses a cane today and has a slow, tenuous, antalgic gait.    MOVEMENT EXAM: Tremor:  There is a variable head tremor, sometime in the yes position, sometimes in the no position.    It is irregular, but the frequency and amplitude of the tremor change especially when asked to tap out a beat with her hand or foot.  It abates with distraction   LABS:  Lab Results  Component Value Date   TSH 1.056 01/12/2014   Lab Results  Component Value Date   WBC 9.5 01/11/2014   HGB 12.7 01/11/2014   HCT 37.6 01/11/2014   MCV 87.9 01/11/2014   PLT 398 01/11/2014     Chemistry      Component Value Date/Time   NA 140 01/11/2014 1831   K 4.3 01/11/2014 1831   CL 100 01/11/2014 1831   CO2 25 01/11/2014 1831   BUN 10 01/11/2014 1831   CREATININE 0.81 01/11/2014 1831      Component Value Date/Time   CALCIUM 10.0 01/11/2014 1831   ALKPHOS 57 01/11/2014 1515   AST 59* 01/11/2014 1515   ALT 33 01/11/2014 1515   BILITOT 0.3 01/11/2014 1515     Lab Results  Component Value Date   VITAMINB12 388 01/12/2014        Assessment/Plan:   1.  Tremor.  -I suspect that this is psychogenic in nature.  It is irregular  with changing amplitude and frequency and abates with distraction.  We talked extensively about this.  Greater than 50% of the visit was spent in counseling.  We talked about the fact that many times these types of tremors are triggered by stress.  I think that her stuttering speech, likewise, is generated by stress. MRI brain was negative.  There was some L slowing on the EEG but no associated epileptiform activity, and no evidence of structural abnormality on the MRI of the brain to account for this.  She and I discussed this today.  We discussed the importance of long-term counseling to get to the etiology of the stress that is causing the symptoms.  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  I discussed with her that I thought that it would take many months of intensive therapy, if not longer, to get better.  She was willing to go to therapy, but did not want to continue with the current therapist.  I talked with Dr. Cheryln Manly, and he was willing to see her and I will refer her to him.  She will followup with [place] on an as-needed basis.

## 2014-04-21 NOTE — Addendum Note (Signed)
Addended byAnnamaria Helling on: 04/21/2014 02:24 PM   Modules accepted: Orders

## 2014-04-22 ENCOUNTER — Ambulatory Visit: Payer: BC Managed Care – PPO | Admitting: Licensed Clinical Social Worker

## 2014-04-26 NOTE — Telephone Encounter (Signed)
Patient aware Dr Cheryln Manly approved referral. I spoke with [facility] and patient has to call to set up appt. Referral in place and information given to patient. She will call if needed.

## 2014-05-09 ENCOUNTER — Ambulatory Visit (INDEPENDENT_AMBULATORY_CARE_PROVIDER_SITE_OTHER): Payer: BC Managed Care – PPO | Admitting: Internal Medicine

## 2014-05-09 ENCOUNTER — Encounter: Payer: Self-pay | Admitting: Internal Medicine

## 2014-05-09 VITALS — BP 132/90 | HR 76 | Temp 98.1°F | Wt 224.2 lb

## 2014-05-09 DIAGNOSIS — R259 Unspecified abnormal involuntary movements: Secondary | ICD-10-CM

## 2014-05-09 DIAGNOSIS — R251 Tremor, unspecified: Secondary | ICD-10-CM

## 2014-05-09 DIAGNOSIS — G43901 Migraine, unspecified, not intractable, with status migrainosus: Secondary | ICD-10-CM

## 2014-05-09 MED ORDER — SUMATRIPTAN SUCCINATE 100 MG PO TABS: ORAL_TABLET | ORAL | Status: DC

## 2014-05-09 NOTE — Progress Notes (Signed)
Subjective:    Patient ID: Paula [name], female    DOB: 1961/04/30, 53 y.o.   MRN: [id number]  HPI  She's had  migraines since 05/07/14. There was no trigger or exacerbating factor for the migraine.  It is described as sharp, initially  lasting seconds-hours and now constant. It's localized over the left eye and peri auricular area.  It is nonvariable in intensity.  Tylenol is of no benefit.  Her last migraine prior to this event was approximately 18 months ago. She is being evaluated by Dr. Carles Collet for tremors and speech issues. Her speech symptoms are worse; tremors have improved.  She was prescribed Neurontin to be taken daily for the speech & tremor; she takes it irregularly as she feels it is ineffective.Dr Tat recommended counselling rather than any medications.  She has had some blurred vision and tearing of eyes on occasion  She also has had vertigo and ambulates with a cane. She's had vertigo intermittently since 2009.  She describes some "hot, burning", pain from her left knee and hip to the feet.  She also has fatigue which preceded the migraine. It has been worse since the migraine flare.  She is being treated with CPAP for sleep apnea.      Review of Systems  There is no associated diplopia or vision loss  No sensitivity to sound or light.  She has no tinnitus or hearing loss  There is no associated weakness or numbness in the extremities.  She has no symptoms or signs of rhinosinusitis.  No rash or change in color/temperature of skin in the area of her symptoms.  She denies any abnormal bruising or bleeding.     Objective:   Physical Exam Gen.: Healthy and well-nourished in appearance. Alert, appropriate and cooperative throughout exam. Appears younger than stated age  Head: Normocephalic without obvious abnormalities Eyes: No corneal or conjunctival inflammation noted. Pupils equal round reactive to light and accommodation. Extraocular motion intact.    Ears: External  ear exam reveals no significant lesions or deformities. Canals clear .TMs normal. Hearing is grossly normal bilaterally. Nose: External nasal exam reveals no deformity or inflammation. Nasal mucosa are pink and moist. No lesions or exudates noted.   Mouth: Oral mucosa and oropharynx reveal no lesions or exudates. Teeth in good repair. Neck: No deformities, masses, or tenderness noted. Range of motion good. Thyroid normal. Lungs: Normal respiratory effort; chest expands symmetrically. Lungs are clear to auscultation without rales, wheezes, or increased work of breathing. Heart: Normal rate and rhythm. Normal S1 and S2. No gallop, click, or rub. No murmur. BP recheck 128/78. Abdomen: Bowel sounds normal; abdomen soft and nontender. No masses, organomegaly or hernias noted.                                 Musculoskeletal/extremities: No deformity or scoliosis noted of  the thoracic or lumbar spine.  No clubbing, cyanosis, edema, or significant extremity  deformity noted. Range of motion decreased L hip. Tenderness L medial knee. .Tone  Normal. Weakness to opposition in hands Hand joints normal  Able to lie down & sit up w/o help. Negative SLR bilaterally Vascular: Carotid, radial artery, dorsalis pedis and  posterior tibial pulses are full and equal. No bruits present. Neurologic: Alert and oriented x3. Deep tendon reflexes symmetrical and normal.  Gait slow & deliberate. Using cane. Rhomberg & finger to nose negative. Stammering speech pattern. Tremor of head.  Skin: Intact without suspicious lesions or rashes. Lymph: No cervical, axillary, or inguinal lymphadenopathy present. Psych: Mood and affect are normal. Normally interactive                                                                                        Assessment & Plan:  #1 migraine #2 tremor See orders

## 2014-05-09 NOTE — Patient Instructions (Signed)

## 2014-05-09 NOTE — Progress Notes (Signed)
Pre visit review using our clinic review tool, if applicable. No additional management support is needed unless otherwise documented below in the visit note. 

## 2014-05-11 ENCOUNTER — Ambulatory Visit (INDEPENDENT_AMBULATORY_CARE_PROVIDER_SITE_OTHER): Payer: BC Managed Care – PPO | Admitting: Psychology

## 2014-05-11 DIAGNOSIS — F449 Dissociative and conversion disorder, unspecified: Secondary | ICD-10-CM

## 2014-05-25 ENCOUNTER — Emergency Department (HOSPITAL_COMMUNITY): Payer: BC Managed Care – PPO

## 2014-05-25 ENCOUNTER — Emergency Department (HOSPITAL_COMMUNITY)
Admission: EM | Admit: 2014-05-25 | Discharge: 2014-05-25 | Disposition: A | Discharge status: Home / Self Care | Payer: BC Managed Care – PPO | Attending: Emergency Medicine | Admitting: Emergency Medicine

## 2014-05-25 ENCOUNTER — Other Ambulatory Visit (HOSPITAL_COMMUNITY): Payer: Self-pay

## 2014-05-25 ENCOUNTER — Encounter (HOSPITAL_COMMUNITY): Payer: Self-pay | Admitting: Emergency Medicine

## 2014-05-25 DIAGNOSIS — R1310 Dysphagia, unspecified: Secondary | ICD-10-CM | POA: Insufficient documentation

## 2014-05-25 DIAGNOSIS — Z8719 Personal history of other diseases of the digestive system: Secondary | ICD-10-CM | POA: Insufficient documentation

## 2014-05-25 DIAGNOSIS — Z8679 Personal history of other diseases of the circulatory system: Secondary | ICD-10-CM | POA: Insufficient documentation

## 2014-05-25 DIAGNOSIS — F3289 Other specified depressive episodes: Secondary | ICD-10-CM | POA: Insufficient documentation

## 2014-05-25 DIAGNOSIS — Z9981 Dependence on supplemental oxygen: Secondary | ICD-10-CM | POA: Insufficient documentation

## 2014-05-25 DIAGNOSIS — IMO0002 Reserved for concepts with insufficient information to code with codable children: Secondary | ICD-10-CM | POA: Insufficient documentation

## 2014-05-25 DIAGNOSIS — Z862 Personal history of diseases of the blood and blood-forming organs and certain disorders involving the immune mechanism: Secondary | ICD-10-CM | POA: Insufficient documentation

## 2014-05-25 DIAGNOSIS — F419 Anxiety disorder, unspecified: Secondary | ICD-10-CM

## 2014-05-25 DIAGNOSIS — E669 Obesity, unspecified: Secondary | ICD-10-CM | POA: Insufficient documentation

## 2014-05-25 DIAGNOSIS — Z8739 Personal history of other diseases of the musculoskeletal system and connective tissue: Secondary | ICD-10-CM | POA: Insufficient documentation

## 2014-05-25 DIAGNOSIS — Z79899 Other long term (current) drug therapy: Secondary | ICD-10-CM | POA: Insufficient documentation

## 2014-05-25 DIAGNOSIS — R011 Cardiac murmur, unspecified: Secondary | ICD-10-CM | POA: Insufficient documentation

## 2014-05-25 DIAGNOSIS — Z9109 Other allergy status, other than to drugs and biological substances: Secondary | ICD-10-CM | POA: Insufficient documentation

## 2014-05-25 DIAGNOSIS — G4733 Obstructive sleep apnea (adult) (pediatric): Secondary | ICD-10-CM | POA: Insufficient documentation

## 2014-05-25 DIAGNOSIS — F329 Major depressive disorder, single episode, unspecified: Secondary | ICD-10-CM | POA: Insufficient documentation

## 2014-05-25 DIAGNOSIS — I10 Essential (primary) hypertension: Secondary | ICD-10-CM | POA: Insufficient documentation

## 2014-05-25 DIAGNOSIS — Z87891 Personal history of nicotine dependence: Secondary | ICD-10-CM | POA: Insufficient documentation

## 2014-05-25 DIAGNOSIS — Z8639 Personal history of other endocrine, nutritional and metabolic disease: Secondary | ICD-10-CM | POA: Insufficient documentation

## 2014-05-25 DIAGNOSIS — F8081 Childhood onset fluency disorder: Secondary | ICD-10-CM | POA: Insufficient documentation

## 2014-05-25 DIAGNOSIS — R51 Headache: Secondary | ICD-10-CM | POA: Insufficient documentation

## 2014-05-25 DIAGNOSIS — F411 Generalized anxiety disorder: Secondary | ICD-10-CM | POA: Insufficient documentation

## 2014-05-25 LAB — CBC WITH DIFFERENTIAL/PLATELET
BASOS PCT: 0 % (ref 0–1)
Basophils Absolute: 0 10*3/uL (ref 0.0–0.1)
Eosinophils Absolute: 0.3 10*3/uL (ref 0.0–0.7)
Eosinophils Relative: 3 % (ref 0–5)
HEMATOCRIT: 42 % (ref 36.0–46.0)
HEMOGLOBIN: 14 g/dL (ref 12.0–15.0)
LYMPHS ABS: 4.3 10*3/uL — AB (ref 0.7–4.0)
LYMPHS PCT: 47 % — AB (ref 12–46)
MCH: 29 pg (ref 26.0–34.0)
MCHC: 33.3 g/dL (ref 30.0–36.0)
MCV: 87 fL (ref 78.0–100.0)
MONOS PCT: 6 % (ref 3–12)
Monocytes Absolute: 0.5 10*3/uL (ref 0.1–1.0)
NEUTROS ABS: 4 10*3/uL (ref 1.7–7.7)
NEUTROS PCT: 44 % (ref 43–77)
Platelets: 361 10*3/uL (ref 150–400)
RBC: 4.83 MIL/uL (ref 3.87–5.11)
RDW: 12.2 % (ref 11.5–15.5)
WBC: 9.1 10*3/uL (ref 4.0–10.5)

## 2014-05-25 LAB — APTT: aPTT: 35 seconds (ref 24–37)

## 2014-05-25 LAB — COMPREHENSIVE METABOLIC PANEL
ALBUMIN: 5.1 g/dL (ref 3.5–5.2)
ALK PHOS: 89 U/L (ref 39–117)
ALT: 32 U/L (ref 0–35)
AST: 28 U/L (ref 0–37)
BILIRUBIN TOTAL: 0.4 mg/dL (ref 0.3–1.2)
BUN: 12 mg/dL (ref 6–23)
CO2: 23 meq/L (ref 19–32)
Calcium: 11.1 mg/dL — ABNORMAL HIGH (ref 8.4–10.5)
Chloride: 102 mEq/L (ref 96–112)
Creatinine, Ser: 0.71 mg/dL (ref 0.50–1.10)
GFR calc Af Amer: >90 mL/min (ref >90)
Glucose, Bld: 81 mg/dL (ref 70–99)
POTASSIUM: 4.2 meq/L (ref 3.7–5.3)
Sodium: 144 mEq/L (ref 137–147)
Total Protein: 8.6 g/dL — ABNORMAL HIGH (ref 6.0–8.3)

## 2014-05-25 LAB — RAPID URINE DRUG SCREEN, HOSP PERFORMED
Amphetamines: NOT DETECTED
BARBITURATES: NOT DETECTED
Benzodiazepines: NOT DETECTED
Cocaine: NOT DETECTED
OPIATES: NOT DETECTED
TETRAHYDROCANNABINOL: NOT DETECTED

## 2014-05-25 LAB — URINALYSIS, ROUTINE W REFLEX MICROSCOPIC
Bilirubin Urine: NEGATIVE
Glucose, UA: NEGATIVE mg/dL
Ketones, ur: NEGATIVE mg/dL
LEUKOCYTES UA: NEGATIVE
Nitrite: NEGATIVE
Protein, ur: NEGATIVE mg/dL
Specific Gravity, Urine: 1.009 (ref 1.005–1.030)
UROBILINOGEN UA: 0.2 mg/dL (ref 0.0–1.0)
pH: 6 (ref 5.0–8.0)

## 2014-05-25 LAB — PROTIME-INR
INR: 1.04 (ref 0.00–1.49)
Prothrombin Time: 13.6 seconds (ref 11.6–15.2)

## 2014-05-25 LAB — URINE MICROSCOPIC-ADD ON

## 2014-05-25 LAB — ETHANOL: Alcohol, Ethyl (B): <11 mg/dL (ref 0–11)

## 2014-05-25 MED ORDER — LORAZEPAM 2 MG/ML IJ SOLN
1.0000 mg | Freq: Once | INTRAMUSCULAR | Status: AC
  Administered 2014-05-25: 1 mg via INTRAVENOUS
  Filled 2014-05-25: qty 1

## 2014-05-25 MED ORDER — LORAZEPAM 1 MG PO TABS: 0.5000 mg | ORAL_TABLET | Freq: Every day | ORAL | Status: DC

## 2014-05-25 MED ORDER — IOHEXOL 300 MG/ML  SOLN
80.0000 mL | Freq: Once | INTRAMUSCULAR | Status: AC | PRN
  Administered 2014-05-25: 80 mL via INTRAVENOUS

## 2014-05-25 NOTE — ED Notes (Signed)
Pt returned from radiology.

## 2014-05-25 NOTE — ED Notes (Signed)
Pt speech appears improved, speech is more fluent and slurring/studdering decreased

## 2014-05-25 NOTE — ED Notes (Signed)
Patient transported to MRI 

## 2014-05-25 NOTE — ED Notes (Signed)
Pt presents to department for evaluation of slurred speech. Husband states she had severe anxiety attack last night around 7:00pm, pt now states she is having difficulty speaking and feels tired. Denies pain at the time. Pt is alert and oriented x4. Able to move all extremities upon arrival. No facial droop. Strong equal bilateral grip strengths.

## 2014-05-25 NOTE — ED Provider Notes (Signed)
5:47 PM Assumed care from [facility], PA-C, please see their note for full history, physical and decision making until this point. In brief this is a 53 y.o. year old female who presented to the ED tonight with Aphasia and Anxiety     Patient with stuttering after panic attack last night. No focal symptoms, awaiting imaging for d/c.   Imaging negative. Patient with improved stuttering after ativan but still some. Spoke with neurology on phone and they didn't feel, based on my description alone, she needed further neuro evaluation. On reexamination patient still with no neuro deficits. Feel this is likely related to anxiety, rx for ativan given until she can follow up with her pcp.   Discharge instructions, including strict return precautions for new or worsening symptoms, given. Patient and/or family verbalized understanding and agreement with the plan as described.   Labs, studies and imaging reviewed by myself and considered in medical decision making if ordered. Imaging interpreted by radiology. Pt was discussed with my attending, Dr. Aline Brochure.  Labs Reviewed  CBC WITH DIFFERENTIAL - Abnormal; Notable for the following:    Lymphocytes Relative 47 (*)    Lymphs Abs 4.3 (*)    All other components within normal limits  COMPREHENSIVE METABOLIC PANEL - Abnormal; Notable for the following:    Calcium 11.1 (*)    Total Protein 8.6 (*)    All other components within normal limits  URINALYSIS, ROUTINE W REFLEX MICROSCOPIC - Abnormal; Notable for the following:    Hgb urine dipstick TRACE (*)    All other components within normal limits  URINE RAPID DRUG SCREEN (HOSP PERFORMED)  ETHANOL  URINE MICROSCOPIC-ADD ON  PROTIME-INR  APTT    New Prescriptions   No medications on file    Follow-up Information   Follow up with [name], MD In 1 week.   Specialty:  Internal Medicine   Contact information:   [place] [address] [phone]       Follow up with  [place]. (If symptoms worsen)    Specialty:  Emergency Medicine   Contact information:   [address] [id number] [place] [address] [phone]       Merrily Pew, MD 05/26/14 1155

## 2014-05-25 NOTE — ED Provider Notes (Signed)
Medical screening examination/treatment/procedure(s) were performed by non-physician practitioner and as supervising physician I was immediately available for consultation/collaboration.   EKG Interpretation None        Orpah Greek, MD 05/25/14 [phone]

## 2014-05-25 NOTE — ED Provider Notes (Signed)
CSN: [id number]     Arrival date & time 05/25/14  1135 History   First MD Initiated Contact with Patient 05/25/14 1301     Chief Complaint  Patient presents with  . Aphasia  . Anxiety     (Consider location/radiation/quality/duration/timing/severity/associated sxs/prior Treatment) Patient is a 53 y.o. female presenting with anxiety. The history is provided by the patient and medical records. No language interpreter was used.  Anxiety Associated symptoms include headaches. Pertinent negatives include no abdominal pain, chest pain, coughing, diaphoresis, fatigue, fever, nausea, rash or vomiting.    Paula [name] is a 53 y.o. female  with a hx of anxiety, depression, HTN, sleep apnea, vertigo, IBD, migraine, tremor  presents to the Emergency Department complaining of gradual, persistent, progressively worsening headache with associated stuttering and feeling of throat cloasing onset 7pm last night after having a panic attack at church.  Husband reports that a similar episode occurred in Feb, 2015 but was accompanied by tremor and weakness without stuttering.  Pt reports moderate, generalized headache onset at the same time as her panic attack.  She reports she was able to sleep with her CPAP without difficulty, but awoke this morning feeling the same.  Pt denies other focal neurologic symptoms.  No aggravating or alleviating factors.  She reports taking migraine medication in the past, but none today.  Pt denies fever, chills, neck pain, neck stiffness, chest pain, shortness of breath, abdominal pain, nausea, vomiting, diarrhea, weakness dizziness, syncope, dysuria..     Past Medical History  Diagnosis Date  . Allergy   . Anemia   . Anxiety   . Depression   . GERD (gastroesophageal reflux disease)   . Heart murmur   . Hyperlipidemia   . Hypertension   . Thyroid disease     nodule  . Ulcer   . Diverticulosis   . Sleep apnea     on C-pap machine  . Arthritis   . Vertigo   . Hernia    . IBS (irritable bowel syndrome)   . Obesity   . Tremor   . Osteoarthritis   . Migraine   . Restless leg    Past Surgical History  Procedure Laterality Date  . Cholecystectomy    . Cesarean section    . Cervical fusion    . Inguinal hernia repair    . Partial hysterectomy     Family History  Problem Relation Age of Onset  . Colon cancer Maternal Grandfather   . Hypertension Maternal Grandfather   . Cancer Maternal Grandfather     lung  . Hyperlipidemia Mother   . Hypertension Mother   . Hypertension Maternal Grandmother   . Hypertension Brother   . Hypertension Sister   . Cancer Maternal Aunt     lung   History  Substance Use Topics  . Smoking status: Former Smoker -- 20 years    Quit date: 07/24/2008  . Smokeless tobacco: Never Used     Comment: 1 pack per week  . Alcohol Use: No   OB History   Grav Para Term Preterm Abortions TAB SAB Ect Mult Living   3 2 2  1 1    2      Review of Systems  Constitutional: Negative for fever, diaphoresis, appetite change, fatigue and unexpected weight change.  HENT: Positive for trouble swallowing. Negative for mouth sores.   Eyes: Negative for visual disturbance.  Respiratory: Negative for cough, chest tightness, shortness of breath and wheezing.   Cardiovascular: Negative for  chest pain.  Gastrointestinal: Negative for nausea, vomiting, abdominal pain, diarrhea and constipation.  Endocrine: Negative for polydipsia, polyphagia and polyuria.  Genitourinary: Negative for dysuria, urgency, frequency and hematuria.  Musculoskeletal: Negative for back pain and neck stiffness.  Skin: Negative for rash.  Allergic/Immunologic: Negative for immunocompromised state.  Neurological: Positive for headaches. Negative for syncope and light-headedness.  Hematological: Does not bruise/bleed easily.  Psychiatric/Behavioral: Negative for sleep disturbance. The patient is not nervous/anxious.       Allergies  Aspirin;  Hydrocodone-acetaminophen; Moxifloxacin; and Oxycodone  Home Medications   Prior to Admission medications   Medication Sig Start Date End Date Taking? Authorizing Provider  cholecalciferol (VITAMIN D) 1000 UNITS tablet Take 1 tablet (1,000 Units total) by mouth daily. 01/14/14 01/14/15 Yes Aleksei Plotnikov V, MD  esomeprazole (NEXIUM) 20 MG capsule Take 20 mg by mouth daily at 12 noon.   Yes Historical Provider, MD  gabapentin (NEURONTIN) 100 MG capsule Take 100 mg by mouth 2 (two) times daily.   Yes Historical Provider, MD  SUMAtriptan (IMITREX) 100 MG tablet May repeat in 2 hours if headache persists or recurs. 05/09/14  Yes Hendricks Limes, MD   BP 132/59  Pulse 74  Temp(Src) 98.6 F (37 C) (Oral)  Resp 18  Wt 224 lb (101.606 kg)  SpO2 98% Physical Exam  Nursing note and vitals reviewed. Constitutional: She is oriented to person, place, and time. She appears well-developed and well-nourished. No distress.  HENT:  Head: Normocephalic and atraumatic.  Mouth/Throat: Oropharynx is clear and moist.  Eyes: Conjunctivae and EOM are normal. Pupils are equal, round, and reactive to light. No scleral icterus.  No horizontal, vertical or rotational nystagmus  Neck: Normal range of motion. Neck supple.  Full active and passive ROM without pain No midline or paraspinal tenderness No nuchal rigidity or meningeal signs Patent airway, no stridor, handling secretions without difficulty  Cardiovascular: Normal rate, regular rhythm, normal heart sounds and intact distal pulses.   No murmur heard. Pulmonary/Chest: Effort normal and breath sounds normal. No respiratory distress. She has no wheezes. She has no rales.  Abdominal: Soft. Bowel sounds are normal. There is no tenderness. There is no rebound and no guarding.  Musculoskeletal: Normal range of motion.  Lymphadenopathy:    She has no cervical adenopathy.  Neurological: She is alert and oriented to person, place, and time. She has normal  reflexes. No cranial nerve deficit. She exhibits normal muscle tone. Coordination normal.  Mental Status:  Alert, oriented, thought content appropriate.  Stuttering speech Able to follow 2 step commands without difficulty.  Cranial Nerves:  II:  Peripheral visual fields grossly normal, pupils equal, round, reactive to light III,IV, VI: ptosis not present, extra-ocular motions intact bilaterally  V,VII: smile symmetric, facial light touch sensation equal VIII: hearing grossly normal bilaterally  IX,X: gag reflex present  XI: bilateral shoulder shrug equal and strong XII: midline tongue extension  Motor:  5/5 in upper and lower extremities bilaterally including strong and equal grip strength and dorsiflexion/plantar flexion Sensory: Pinprick and light touch normal in all extremities.  Deep Tendon Reflexes: 2+ and symmetric  Cerebellar: normal finger-to-nose with bilateral upper extremities Gait: normal gait and balance CV: distal pulses palpable throughout  Patient swallows without difficulty  Skin: Skin is warm and dry. No rash noted. She is not diaphoretic.  Psychiatric: She has a normal mood and affect. Her behavior is normal. Judgment and thought content normal.    ED Course  Procedures (including critical care time) Labs  Review Labs Reviewed  CBC WITH DIFFERENTIAL - Abnormal; Notable for the following:    Lymphocytes Relative 47 (*)    Lymphs Abs 4.3 (*)    All other components within normal limits  COMPREHENSIVE METABOLIC PANEL - Abnormal; Notable for the following:    Calcium 11.1 (*)    Total Protein 8.6 (*)    All other components within normal limits  ETHANOL  URINE RAPID DRUG SCREEN (HOSP PERFORMED)  URINALYSIS, ROUTINE W REFLEX MICROSCOPIC    Imaging Review No results found.   EKG Interpretation None      MDM   Final diagnoses:  Stuttering  Anxiety   Paula [name] presents with stuttering; likely psychogenic, but will begin CVA/TIA work-up.  LSW was  before 7pm last night and we therefore will not call a code stroke.  Patient without other focal neurologic deficit on exam.  3:23 PM Labs largely unremarkable with mildly elevated calcium at 11.1.  No leukocytosis or anemia.  CT head, CT neck and MRI brain are pending.  Pt will likely need neurology consult if these are negative.    Plan for migraine cocktail and MRI if CT head/neck are normal.    Discussed with Dr. Dayna Barker who will follow and dispo accordingly.    Jarrett Soho Arley Salamone, PA-C 05/25/14 1606

## 2014-05-26 ENCOUNTER — Ambulatory Visit (INDEPENDENT_AMBULATORY_CARE_PROVIDER_SITE_OTHER): Payer: BC Managed Care – PPO | Admitting: Psychology

## 2014-05-26 DIAGNOSIS — F449 Dissociative and conversion disorder, unspecified: Secondary | ICD-10-CM

## 2014-05-26 NOTE — ED Provider Notes (Signed)
Medical screening examination/treatment/procedure(s) were conducted as a shared visit with resident physician and myself.  I personally evaluated the patient during the encounter.   I interviewed and examined the patient. Lungs are CTAB. Cardiac exam wnl. Abdomen soft. MRI neg, discussed w/ neuro. Will d/c home.   Blanchard Kelch, MD 05/26/14 1740

## 2014-05-27 ENCOUNTER — Encounter: Payer: Self-pay | Admitting: Internal Medicine

## 2014-05-27 ENCOUNTER — Ambulatory Visit (INDEPENDENT_AMBULATORY_CARE_PROVIDER_SITE_OTHER): Payer: BC Managed Care – PPO | Admitting: Internal Medicine

## 2014-05-27 VITALS — BP 142/90 | HR 72 | Temp 98.3°F | Resp 16 | Wt 225.0 lb

## 2014-05-27 DIAGNOSIS — F459 Somatoform disorder, unspecified: Secondary | ICD-10-CM

## 2014-05-27 DIAGNOSIS — E785 Hyperlipidemia, unspecified: Secondary | ICD-10-CM

## 2014-05-27 DIAGNOSIS — F411 Generalized anxiety disorder: Secondary | ICD-10-CM

## 2014-05-27 MED ORDER — CLONAZEPAM 0.5 MG PO TABS: 0.2500 mg | ORAL_TABLET | Freq: Three times a day (TID) | ORAL | Status: DC | PRN

## 2014-05-27 NOTE — Progress Notes (Signed)
Subjective:    HPI  F/up -- psychogenic tremor (Dr Tat) and throat spasms are worse now again; memory is better. Pt is seeing Dr Cheryln Manly for a conversion disorder. She had an EEG and an MRI.  F/u: Tremor of face and hands  Active Problems:  OBESITY, MORBID  ANXIETY  DEPRESSION  Dyspnea      The results of significant diagnostics from this hospitalization (including imaging, microbiology, ancillary and laboratory) are listed below for reference.   Significant Diagnostic Studies:  Dg Chest 2 View  01/12/2014 CLINICAL DATA: Shortness of breath, nausea EXAM: CHEST 2 VIEW COMPARISON: January 11, 2014 FINDINGS: The heart size and mediastinal contours are within normal limits. There is no focal infiltrate, pulmonary edema, or pleural effusion. The visualized skeletal structures are stable. IMPRESSION: No active cardiopulmonary disease. Electronically Signed By: Abelardo Diesel M.D. On: 01/12/2014 02:16  Dg Chest 2 View  01/11/2014 CLINICAL DATA: Shortness of breath EXAM: CHEST 2 VIEW COMPARISON: 04/27/2013 FINDINGS: The heart size and mediastinal contours are within normal limits. Both lungs are clear. The visualized skeletal structures are unremarkable. IMPRESSION: No active cardiopulmonary disease. Electronically Signed By: Inez Catalina M.D. On: 01/11/2014 16:08  Dg Lumbar Spine Complete  01/11/2014 CLINICAL DATA: Low back pain for several months. EXAM: LUMBAR SPINE - COMPLETE 4+ VIEW COMPARISON: None. FINDINGS: There is no evidence of lumbar spine fracture. Alignment is normal. Intervertebral disc spaces are maintained. IMPRESSION: Negative exam. Electronically Signed By: Inge Rise M.D. On: 01/11/2014 20:37  Ct Head Wo Contrast  01/12/2014 CLINICAL DATA: Headache and dizziness. Tremors. EXAM: CT HEAD WITHOUT CONTRAST TECHNIQUE: Contiguous axial images were obtained from the base of the skull through the vertex without intravenous contrast. COMPARISON: None. FINDINGS: No mass lesion, mass  effect, midline shift, hydrocephalus, hemorrhage. No territorial ischemia or acute infarction. Paranasal sinuses appear within normal limits. Mastoid air cells clear. IMPRESSION: Negative CT head. Electronically Signed By: Dereck Ligas M.D. On: 01/12/2014 02:30  Ct Soft Tissue Neck W Contrast  01/12/2014 CLINICAL DATA: Throat pain. Headache, dizziness and tremors. Shortness of breath. EXAM: CT NECK WITH CONTRAST TECHNIQUE: Multidetector CT imaging of the neck was performed using the standard protocol following the bolus administration of intravenous contrast. CONTRAST: 61mL OMNIPAQUE IOHEXOL 300 MG/ML SOLN COMPARISON: CT HEAD W/O CM dated 01/12/2014; CT C SPINE W/O CM dated 09/01/2013; CT HEAD W/CM dated 09/01/2013 FINDINGS: Study is degraded by large amount of artifact from corpectomy and ACDF device extending from on C4 through C6 and terminating over T1. Allowing for this artifact, the chest and thoracic inlet appear within normal limits. Small symmetric Level 2 lymph nodes are present which are probably reactive to upper were airway process. There is a small prevertebral effusion. There is no peritonsillar abscess. Bones demonstrate C2-C3 and C3-C4 degenerative disease which appears similar to the prior exam. No cervical spine fracture or or acute abnormality is identified. Mild adenoidal hypertrophy is present. IMPRESSION: 1. Study degraded by long segment cervical spine stabilization hardware. 2. Small prevertebral effusion, most commonly associated with upper respiratory infection. No abscess. 3. Symmetric bilateral level 2 lymphadenopathy is probably reactive. Electronically Signed By: Dereck Ligas M.D. On: 01/12/2014 02:34  Microbiology:   Review of Systems  Constitutional: Positive for fatigue. Negative for chills, activity change, appetite change and unexpected weight change.  HENT: Negative for congestion, mouth sores, rhinorrhea and sinus pressure.   Eyes: Negative for visual disturbance.   Respiratory: Negative for cough and chest tightness.   Gastrointestinal: Negative for nausea, vomiting and abdominal pain.  Genitourinary: Negative for frequency, difficulty urinating and vaginal pain.  Musculoskeletal: Positive for back pain and neck pain. Negative for gait problem.  Skin: Negative for pallor and rash.  Allergic/Immunologic: Negative for immunocompromised state.  Neurological: Positive for dizziness, tremors, weakness and light-headedness. Negative for seizures, syncope, numbness and headaches.  Psychiatric/Behavioral: Negative for suicidal ideas, confusion and sleep disturbance. The patient is nervous/anxious.        Objective:   Physical Exam  Constitutional: She appears well-developed. No distress.  Obese  HENT:  Head: Normocephalic.  Right Ear: External ear normal.  Left Ear: External ear normal.  Nose: Nose normal.  Mouth/Throat: Oropharynx is clear and moist.  Eyes: Conjunctivae are normal. Pupils are equal, round, and reactive to light. Right eye exhibits no discharge. Left eye exhibits no discharge.  Neck: Normal range of motion. Neck supple. No JVD present. No tracheal deviation present. No thyromegaly present.  Cardiovascular: Normal rate, regular rhythm and normal heart sounds.   Pulmonary/Chest: No stridor. No respiratory distress. She has no wheezes.  Abdominal: Soft. Bowel sounds are normal. She exhibits no distension and no mass. There is no tenderness. There is no rebound and no guarding.  Musculoskeletal: She exhibits tenderness (neck). She exhibits no edema.  Lymphadenopathy:    She has no cervical adenopathy.  Neurological: She displays normal reflexes. No cranial nerve deficit. She exhibits normal muscle tone. Coordination abnormal.  Head and hands tremor - much less dramatic (would disappear when distracted) - better Face tics - resolved Less dramatic ataxia and dysbalance - much better Dysarthric w/spastic speech - resolved  Skin: No rash  noted. No erythema. No pallor.  Psychiatric: She has a normal mood and affect. Judgment and thought content normal.    EEG MRI      Assessment & Plan:

## 2014-05-27 NOTE — Assessment & Plan Note (Signed)
Continue with current low fat diet

## 2014-05-27 NOTE — Assessment & Plan Note (Addendum)
Worse again - waxing and waning sx's Conversion disorder - Head tremor, SOB, neck spasms, aphasia -- Dr Cheryln Manly Will try Clonazepam

## 2014-05-27 NOTE — Progress Notes (Signed)
Pre visit review using our clinic review tool, if applicable. No additional management support is needed unless otherwise documented below in the visit note. 

## 2014-05-27 NOTE — Assessment & Plan Note (Signed)
Stress management discussed F/u w/Dr Cheryln Manly Clonazepam prn

## 2014-06-06 ENCOUNTER — Other Ambulatory Visit: Payer: Self-pay

## 2014-06-06 DIAGNOSIS — Z1231 Encounter for screening mammogram for malignant neoplasm of breast: Secondary | ICD-10-CM

## 2014-06-07 ENCOUNTER — Ambulatory Visit
Admission: RE | Admit: 2014-06-07 | Discharge: 2014-06-07 | Disposition: A | Discharge status: Home / Self Care | Payer: Medicare Other | Source: Ambulatory Visit

## 2014-06-07 DIAGNOSIS — Z1231 Encounter for screening mammogram for malignant neoplasm of breast: Secondary | ICD-10-CM

## 2014-06-13 ENCOUNTER — Other Ambulatory Visit (HOSPITAL_COMMUNITY)
Admission: RE | Admit: 2014-06-13 | Discharge: 2014-06-13 | Disposition: A | Discharge status: Home / Self Care | Payer: BC Managed Care – PPO | Source: Ambulatory Visit | Attending: Obstetrics & Gynecology | Admitting: Obstetrics & Gynecology

## 2014-06-13 ENCOUNTER — Telehealth: Payer: Self-pay | Admitting: Neurology

## 2014-06-13 ENCOUNTER — Other Ambulatory Visit: Payer: Self-pay | Admitting: Obstetrics & Gynecology

## 2014-06-13 DIAGNOSIS — Z1151 Encounter for screening for human papillomavirus (HPV): Secondary | ICD-10-CM | POA: Insufficient documentation

## 2014-06-13 DIAGNOSIS — F444 Conversion disorder with motor symptom or deficit: Secondary | ICD-10-CM

## 2014-06-13 DIAGNOSIS — Z01419 Encounter for gynecological examination (general) (routine) without abnormal findings: Secondary | ICD-10-CM | POA: Insufficient documentation

## 2014-06-13 NOTE — Telephone Encounter (Signed)
Can see if they will see her for second opinion of psychogenic tremor

## 2014-06-13 NOTE — Telephone Encounter (Signed)
Pt came by the office this morning wanting for Dr. Carles Collet to refer her to another neurologist  For a second opinion. She would like to be seen by a neurologist at [facility]. Please call pt [phone]

## 2014-06-13 NOTE — Telephone Encounter (Signed)
Okay to refer? 

## 2014-06-14 LAB — CYTOLOGY - PAP

## 2014-06-14 NOTE — Telephone Encounter (Signed)
Called [phone] and appt made with [facility] - Dr Tammi Sou on 07/14/2014 at 1:30 pm. Patient made aware. Records faxed to [phone] with confirmation received.

## 2014-06-21 ENCOUNTER — Ambulatory Visit (INDEPENDENT_AMBULATORY_CARE_PROVIDER_SITE_OTHER): Payer: BC Managed Care – PPO | Admitting: Psychology

## 2014-06-21 DIAGNOSIS — F449 Dissociative and conversion disorder, unspecified: Secondary | ICD-10-CM

## 2014-07-02 ENCOUNTER — Encounter (HOSPITAL_COMMUNITY): Payer: Self-pay | Admitting: Emergency Medicine

## 2014-07-02 ENCOUNTER — Emergency Department (HOSPITAL_COMMUNITY)
Admission: EM | Admit: 2014-07-02 | Discharge: 2014-07-03 | Disposition: A | Discharge status: Home / Self Care | Payer: BC Managed Care – PPO | Attending: Emergency Medicine | Admitting: Emergency Medicine

## 2014-07-02 ENCOUNTER — Emergency Department (HOSPITAL_COMMUNITY): Payer: BC Managed Care – PPO

## 2014-07-02 DIAGNOSIS — Z872 Personal history of diseases of the skin and subcutaneous tissue: Secondary | ICD-10-CM | POA: Insufficient documentation

## 2014-07-02 DIAGNOSIS — Z9981 Dependence on supplemental oxygen: Secondary | ICD-10-CM | POA: Insufficient documentation

## 2014-07-02 DIAGNOSIS — E669 Obesity, unspecified: Secondary | ICD-10-CM | POA: Insufficient documentation

## 2014-07-02 DIAGNOSIS — Z87891 Personal history of nicotine dependence: Secondary | ICD-10-CM | POA: Insufficient documentation

## 2014-07-02 DIAGNOSIS — G4730 Sleep apnea, unspecified: Secondary | ICD-10-CM | POA: Insufficient documentation

## 2014-07-02 DIAGNOSIS — E785 Hyperlipidemia, unspecified: Secondary | ICD-10-CM | POA: Insufficient documentation

## 2014-07-02 DIAGNOSIS — M542 Cervicalgia: Secondary | ICD-10-CM | POA: Insufficient documentation

## 2014-07-02 DIAGNOSIS — Z862 Personal history of diseases of the blood and blood-forming organs and certain disorders involving the immune mechanism: Secondary | ICD-10-CM | POA: Insufficient documentation

## 2014-07-02 DIAGNOSIS — F411 Generalized anxiety disorder: Secondary | ICD-10-CM | POA: Insufficient documentation

## 2014-07-02 DIAGNOSIS — G43909 Migraine, unspecified, not intractable, without status migrainosus: Secondary | ICD-10-CM | POA: Insufficient documentation

## 2014-07-02 DIAGNOSIS — F419 Anxiety disorder, unspecified: Secondary | ICD-10-CM

## 2014-07-02 DIAGNOSIS — R011 Cardiac murmur, unspecified: Secondary | ICD-10-CM | POA: Insufficient documentation

## 2014-07-02 DIAGNOSIS — Z8739 Personal history of other diseases of the musculoskeletal system and connective tissue: Secondary | ICD-10-CM | POA: Insufficient documentation

## 2014-07-02 DIAGNOSIS — I10 Essential (primary) hypertension: Secondary | ICD-10-CM | POA: Insufficient documentation

## 2014-07-02 DIAGNOSIS — IMO0002 Reserved for concepts with insufficient information to code with codable children: Secondary | ICD-10-CM | POA: Insufficient documentation

## 2014-07-02 DIAGNOSIS — R0602 Shortness of breath: Secondary | ICD-10-CM | POA: Insufficient documentation

## 2014-07-02 DIAGNOSIS — Z79899 Other long term (current) drug therapy: Secondary | ICD-10-CM | POA: Insufficient documentation

## 2014-07-02 DIAGNOSIS — F8081 Childhood onset fluency disorder: Secondary | ICD-10-CM

## 2014-07-02 DIAGNOSIS — K219 Gastro-esophageal reflux disease without esophagitis: Secondary | ICD-10-CM | POA: Insufficient documentation

## 2014-07-02 LAB — CBC WITH DIFFERENTIAL/PLATELET
BASOS ABS: 0 10*3/uL (ref 0.0–0.1)
Basophils Relative: 0 % (ref 0–1)
EOS ABS: 0.3 10*3/uL (ref 0.0–0.7)
EOS PCT: 3 % (ref 0–5)
HEMATOCRIT: 39.8 % (ref 36.0–46.0)
HEMOGLOBIN: 13.3 g/dL (ref 12.0–15.0)
Lymphocytes Relative: 49 % — ABNORMAL HIGH (ref 12–46)
Lymphs Abs: 5.3 10*3/uL — ABNORMAL HIGH (ref 0.7–4.0)
MCH: 29.2 pg (ref 26.0–34.0)
MCHC: 33.4 g/dL (ref 30.0–36.0)
MCV: 87.3 fL (ref 78.0–100.0)
MONO ABS: 0.7 10*3/uL (ref 0.1–1.0)
MONOS PCT: 6 % (ref 3–12)
Neutro Abs: 4.6 10*3/uL (ref 1.7–7.7)
Neutrophils Relative %: 42 % — ABNORMAL LOW (ref 43–77)
Platelets: 343 10*3/uL (ref 150–400)
RBC: 4.56 MIL/uL (ref 3.87–5.11)
RDW: 12.4 % (ref 11.5–15.5)
WBC: 11 10*3/uL — ABNORMAL HIGH (ref 4.0–10.5)

## 2014-07-02 LAB — RAPID URINE DRUG SCREEN, HOSP PERFORMED
AMPHETAMINES: NOT DETECTED
BARBITURATES: NOT DETECTED
Benzodiazepines: NOT DETECTED
Cocaine: NOT DETECTED
OPIATES: NOT DETECTED
TETRAHYDROCANNABINOL: NOT DETECTED

## 2014-07-02 LAB — BASIC METABOLIC PANEL
Anion gap: 17 — ABNORMAL HIGH (ref 5–15)
BUN: 11 mg/dL (ref 6–23)
CALCIUM: 10.3 mg/dL (ref 8.4–10.5)
CO2: 21 mEq/L (ref 19–32)
CREATININE: 0.73 mg/dL (ref 0.50–1.10)
Chloride: 102 mEq/L (ref 96–112)
GFR calc Af Amer: >90 mL/min (ref >90)
GLUCOSE: 99 mg/dL (ref 70–99)
POTASSIUM: 3.9 meq/L (ref 3.7–5.3)
Sodium: 140 mEq/L (ref 137–147)

## 2014-07-02 LAB — URINALYSIS, ROUTINE W REFLEX MICROSCOPIC
Bilirubin Urine: NEGATIVE
Glucose, UA: NEGATIVE mg/dL
HGB URINE DIPSTICK: NEGATIVE
Ketones, ur: NEGATIVE mg/dL
Leukocytes, UA: NEGATIVE
NITRITE: NEGATIVE
PROTEIN: NEGATIVE mg/dL
Specific Gravity, Urine: 1.004 — ABNORMAL LOW (ref 1.005–1.030)
UROBILINOGEN UA: 0.2 mg/dL (ref 0.0–1.0)
pH: 7.5 (ref 5.0–8.0)

## 2014-07-02 LAB — ETHANOL: Alcohol, Ethyl (B): <11 mg/dL (ref 0–11)

## 2014-07-02 LAB — TROPONIN I: Troponin I: <0.3 ng/mL (ref <0.30)

## 2014-07-02 LAB — PRO B NATRIURETIC PEPTIDE: PRO B NATRI PEPTIDE: 43.6 pg/mL (ref 0–125)

## 2014-07-02 MED ORDER — LORAZEPAM 1 MG PO TABS
1.0000 mg | ORAL_TABLET | Freq: Once | ORAL | Status: AC
  Administered 2014-07-02: 1 mg via ORAL
  Filled 2014-07-02: qty 1

## 2014-07-02 MED ORDER — PREDNISONE 20 MG PO TABS
60.0000 mg | ORAL_TABLET | Freq: Once | ORAL | Status: AC
  Administered 2014-07-02: 60 mg via ORAL
  Filled 2014-07-02: qty 3

## 2014-07-02 MED ORDER — LORAZEPAM 1 MG PO TABS: 1.0000 mg | ORAL_TABLET | Freq: Three times a day (TID) | ORAL | Status: DC | PRN

## 2014-07-02 NOTE — ED Provider Notes (Signed)
CSN: [id number]     Arrival date & time 07/02/14  2001 History   First MD Initiated Contact with Patient 07/02/14 2104     Chief Complaint  Patient presents with  . Shortness of Breath     (Consider location/radiation/quality/duration/timing/severity/associated sxs/prior Treatment) HPI Comments: Patient is a 53 yo F PMHx significant for Anxiety, Depression, HLD, HTN, Vertigo, Psychogenic tremor, throat spasms and stuttering, IBS, Vertigo, Migraines presenting to the ED for acute onset throat spasm and stuttering at 11AM this morning. Patient states her symptoms feel similar to previous ED visit in June. Patient endorses that her neck is "tired, sore, and swollen." Since visit for June, patient has been seen by PCP, neurologist and is going to [facility] to specialists for psychogenic tremor and throat spasms. She has had negative MRI and imaging in the interm. No alleviating or aggravating factors. No medications taken PTA.    Past Medical History  Diagnosis Date  . Allergy   . Anemia   . Anxiety   . Depression   . GERD (gastroesophageal reflux disease)   . Heart murmur   . Hyperlipidemia   . Hypertension   . Thyroid disease     nodule  . Ulcer   . Diverticulosis   . Sleep apnea     on C-pap machine  . Arthritis   . Vertigo   . Hernia   . IBS (irritable bowel syndrome)   . Obesity   . Tremor   . Osteoarthritis   . Migraine   . Restless leg    Past Surgical History  Procedure Laterality Date  . Cholecystectomy    . Cesarean section    . Cervical fusion    . Inguinal hernia repair    . Partial hysterectomy     Family History  Problem Relation Age of Onset  . Colon cancer Maternal Grandfather   . Hypertension Maternal Grandfather   . Cancer Maternal Grandfather     lung  . Hyperlipidemia Mother   . Hypertension Mother   . Hypertension Maternal Grandmother   . Hypertension Brother   . Hypertension Sister   . Cancer Maternal Aunt     lung   History  Substance Use  Topics  . Smoking status: Former Smoker -- 20 years    Quit date: 07/24/2008  . Smokeless tobacco: Never Used     Comment: 1 pack per week  . Alcohol Use: No   OB History   Grav Para Term Preterm Abortions TAB SAB Ect Mult Living   3 2 2  1 1    2      Review of Systems  Constitutional: Negative for fever and chills.  HENT: Positive for sore throat. Negative for trouble swallowing.        Stuttering.   Gastrointestinal: Negative for vomiting and diarrhea.  Musculoskeletal: Positive for neck pain.  Neurological: Positive for headaches. Negative for weakness and numbness.  All other systems reviewed and are negative.     Allergies  Aspirin; Hydrocodone-acetaminophen; Moxifloxacin; and Oxycodone  Home Medications   Prior to Admission medications   Medication Sig Start Date End Date Taking? Authorizing Provider  cholecalciferol (VITAMIN D) 1000 UNITS tablet Take 1 tablet (1,000 Units total) by mouth daily. 01/14/14 01/14/15 Yes Aleksei Plotnikov V, MD  esomeprazole (NEXIUM) 20 MG capsule Take 20 mg by mouth daily at 12 noon.   Yes Historical Provider, MD  gabapentin (NEURONTIN) 100 MG capsule Take 100 mg by mouth 2 (two) times daily.  Yes Historical Provider, MD  PRESCRIPTION MEDICATION Apply 1 application topically daily. Sample Medication given at [facility]. Estrogen gel. appy to shoulders daily   Yes Historical Provider, MD  SUMAtriptan (IMITREX) 100 MG tablet Take 100 mg by mouth every 2 (two) hours as needed for migraine or headache. May repeat in 2 hours if headache persists or recurs.   Yes Historical Provider, MD  LORazepam (ATIVAN) 1 MG tablet Take 1 tablet (1 mg total) by mouth 3 (three) times daily as needed for anxiety. 07/02/14   Nelani Schmelzle L [place] Valdes, PA-C   BP 154/83  Pulse 77  Temp(Src) 97.6 F (36.4 C) (Oral)  Resp 18  SpO2 99% Physical Exam  Nursing note and vitals reviewed. Constitutional: She is oriented to person, place, and time. She appears  well-developed and well-nourished. No distress.  With distraction, patient able to communicate without difficulty or stuttering.   HENT:  Head: Normocephalic and atraumatic.  Right Ear: External ear normal.  Left Ear: External ear normal.  Nose: Nose normal.  Mouth/Throat: Oropharynx is clear and moist. No oropharyngeal exudate.  Eyes: Conjunctivae and EOM are normal. Pupils are equal, round, and reactive to light.  Neck: Normal range of motion. Neck supple.  Cardiovascular: Normal rate, regular rhythm, normal heart sounds and intact distal pulses.   Pulmonary/Chest: Effort normal and breath sounds normal. No respiratory distress.  Abdominal: Soft. There is no tenderness.  Musculoskeletal: She exhibits no edema.  Lymphadenopathy:    She has no cervical adenopathy.  Neurological: She is alert and oriented to person, place, and time. She has normal strength. No cranial nerve deficit. Gait normal. GCS eye subscore is 4. GCS verbal subscore is 5. GCS motor subscore is 6.  Sensation grossly intact.  No pronator drift.  Bilateral heel-knee-shin intact.  Skin: Skin is warm and dry. She is not diaphoretic.  Psychiatric: Her mood appears anxious. Her speech is not slurred.    ED Course  Procedures (including critical care time) Medications  LORazepam (ATIVAN) tablet 1 mg (1 mg Oral Given 07/02/14 2206)  predniSONE (DELTASONE) tablet 60 mg (60 mg Oral Given 07/02/14 2206)    Labs Review Labs Reviewed  CBC WITH DIFFERENTIAL - Abnormal; Notable for the following:    WBC 11.0 (*)    Neutrophils Relative % 42 (*)    Lymphocytes Relative 49 (*)    Lymphs Abs 5.3 (*)    All other components within normal limits  BASIC METABOLIC PANEL - Abnormal; Notable for the following:    Anion gap 17 (*)    All other components within normal limits  URINALYSIS, ROUTINE W REFLEX MICROSCOPIC - Abnormal; Notable for the following:    Specific Gravity, Urine 1.004 (*)    All other components within normal  limits  TROPONIN I  ETHANOL  URINE RAPID DRUG SCREEN (HOSP PERFORMED)  PRO B NATRIURETIC PEPTIDE    Imaging Review Dg Neck Soft Tissue  07/02/2014   CLINICAL DATA:  Sore throat.  EXAM: NECK SOFT TISSUES - 1+ VIEW  COMPARISON:  CT of the neck May 25, 2014  FINDINGS: There is no evidence of retropharyngeal soft tissue swelling or epiglottic enlargement. The cervical airway is unremarkable and no radio-opaque foreign body identified. Status post mid to lower cervical spine corpectomy and anterior plate fixation.  IMPRESSION: Negative.   Electronically Signed   By: Elon Alas   On: 07/02/2014 22:17   Dg Chest 2 View  07/02/2014   CLINICAL DATA:  Shortness of breath. Current history  of hypertension and heart murmur.  EXAM: CHEST  2 VIEW  COMPARISON:  01/12/2014, 04/27/2013, 04/17/2013, 07/30/2010.  FINDINGS: Cardiac silhouette normal in size, unchanged. Thoracic aorta tortuous, unchanged. Hilar and mediastinal contours otherwise unremarkable. Lungs clear. Bronchovascular markings normal. Pulmonary vascularity normal. No visible pleural effusions. No pneumothorax. Visualized bony thorax intact with slight polio she is convex right.  IMPRESSION: No acute cardiopulmonary disease.  Stable examination.   Electronically Signed   By: Evangeline Dakin M.D.   On: 07/02/2014 22:16     EKG Interpretation   Date/Time:  Saturday July 02 2014 20:19:10 EDT Ventricular Rate:  63 PR Interval:  164 QRS Duration: 82 QT Interval:  453 QTC Calculation: 464 R Axis:   12 Text Interpretation:  Sinus rhythm Borderline T abnormalities, diffuse  leads Baseline wander No significant change since last tracing Confirmed  by [facility]  MD, MARTHA [phone]) on 07/02/2014 10:23:05 PM      11:44 PM Patient endorses resolution of symptoms, feeling better, agreeable to discharge home.   MDM   Final diagnoses:  Stuttering  Anxiety    Filed Vitals:   07/03/14 0012  BP: 154/83  Pulse: 77  Temp: 97.6 F (36.4 C)   Resp: 18    Afebrile, NAD, non-toxic appearing, AAOx4. I have reviewed nursing notes, vital signs, and all appropriate lab and imaging results for this patient. No neuro focal deficits on examination. Patient without stuttering or distraction. Physical exam is otherwise unremarkable. Screening blood work, imaging, EKG unremarkable. Patient with history of similar symptoms and negative workup. Patient has been seen by PCP and neurology and diagnosed with a psychogenic tremor with throat spasms and stuttering with symptoms consistent to this evening. Symptoms improved after Ativan administration. Advised patient follow up with her primary care physician and neurologist. Also advised patient to keep her specialty appointment at [facility] as advised. Return precautions discussed. Patient is agreeable to discharge. Patient stable at time of discharge. Patient d/w with Dr. Doy Mince, agrees with plan.       [place], PA-C 07/03/14 0040

## 2014-07-02 NOTE — ED Notes (Signed)
Patient c/o SOB onset at 1100 today. Patient states her head feels like it's banging and that her throat is closing. Patient also experiencing slurred speech. Patient husband at bedside, states this is a change in her speech occurs when her throat becomes swelled. States this is similar to the episode in June 2015. Patient was diagnosed with anxiety at that time. Patient was sitting in the car when today's episode started.

## 2014-07-02 NOTE — Discharge Instructions (Signed)
Please follow up with your primary care physician in 1-2 days. If you do not have one please call the [place] number listed above. Please follow up with Dr. Carles Collet to schedule a follow up appointment.  Please take Ativan as prescribed. Please read all discharge instructions and return precautions.   Panic Attacks Panic attacks are sudden, short-livedsurges of severe anxiety, fear, or discomfort. They may occur for no reason when you are relaxed, when you are anxious, or when you are sleeping. Panic attacks may occur for a number of reasons:   Healthy people occasionally have panic attacks in extreme, life-threatening situations, such as war or natural disasters. Normal anxiety is a protective mechanism of the body that helps [place] react to danger (fight or flight response).  Panic attacks are often seen with anxiety disorders, such as panic disorder, social anxiety disorder, generalized anxiety disorder, and phobias. Anxiety disorders cause excessive or uncontrollable anxiety. They may interfere with your relationships or other life activities.  Panic attacks are sometimes seen with other mental illnesses, such as depression and posttraumatic stress disorder.  Certain medical conditions, prescription medicines, and drugs of abuse can cause panic attacks. SYMPTOMS  Panic attacks start suddenly, peak within 20 minutes, and are accompanied by four or more of the following symptoms:  Pounding heart or fast heart rate (palpitations).  Sweating.  Trembling or shaking.  Shortness of breath or feeling smothered.  Feeling choked.  Chest pain or discomfort.  Nausea or strange feeling in your stomach.  Dizziness, light-headedness, or feeling like you will faint.  Chills or hot flushes.  Numbness or tingling in your lips or hands and feet.  Feeling that things are not real or feeling that you are not yourself.  Fear of losing control or going crazy.  Fear of  dying. Some of these symptoms can mimic serious medical conditions. For example, you may think you are having a heart attack. Although panic attacks can be very scary, they are not life threatening. DIAGNOSIS  Panic attacks are diagnosed through an assessment by your health care provider. Your health care provider will ask questions about your symptoms, such as where and when they occurred. Your health care provider will also ask about your medical history and use of alcohol and drugs, including prescription medicines. Your health care provider may order blood tests or other studies to rule out a serious medical condition. Your health care provider may refer you to a mental health professional for further evaluation. TREATMENT   Most healthy people who have one or two panic attacks in an extreme, life-threatening situation will not require treatment.  The treatment for panic attacks associated with anxiety disorders or other mental illness typically involves counseling with a mental health professional, medicine, or a combination of both. Your health care provider will help determine what treatment is best for you.  Panic attacks due to physical illness usually go away with treatment of the illness. If prescription medicine is causing panic attacks, talk with your health care provider about stopping the medicine, decreasing the dose, or substituting another medicine.  Panic attacks due to alcohol or drug abuse go away with abstinence. Some adults need professional help in order to stop drinking or using drugs. HOME CARE INSTRUCTIONS   Take all medicines as directed by your health care provider.   Schedule and attend follow-up visits as directed by your health care provider. It is important to keep all your appointments. SEEK MEDICAL CARE IF:  You  are not able to take your medicines as prescribed.  Your symptoms do not improve or get worse. SEEK IMMEDIATE MEDICAL CARE IF:   You experience  panic attack symptoms that are different than your usual symptoms.  You have serious thoughts about hurting yourself or others.  You are taking medicine for panic attacks and have a serious side effect. MAKE SURE YOU:  Understand these instructions.  Will watch your condition.  Will get help right away if you are not doing well or get worse. Document Released: 11/18/2005 Document Revised: 11/23/2013 Document Reviewed: 07/02/2013 [facility] Patient Information 2015 [place], [place]. This information is not intended to replace advice given to you by your health care provider. Make sure you discuss any questions you have with your health care provider.

## 2014-07-03 NOTE — ED Provider Notes (Signed)
Medical screening examination/treatment/procedure(s) were performed by non-physician practitioner and as supervising physician I was immediately available for consultation/collaboration.   EKG Interpretation   Date/Time:  Saturday July 02 2014 20:19:10 EDT Ventricular Rate:  63 PR Interval:  164 QRS Duration: 82 QT Interval:  453 QTC Calculation: 464 R Axis:   12 Text Interpretation:  Sinus rhythm Borderline T abnormalities, diffuse  leads Baseline wander No significant change since last tracing Confirmed  by [facility]  MD, MARTHA [phone]) on 07/02/2014 10:23:05 PM        Houston Siren III, MD 07/03/14 [phone]

## 2014-07-13 ENCOUNTER — Ambulatory Visit: Payer: BC Managed Care – PPO | Admitting: Psychology

## 2014-07-29 ENCOUNTER — Ambulatory Visit (INDEPENDENT_AMBULATORY_CARE_PROVIDER_SITE_OTHER): Payer: BC Managed Care – PPO | Admitting: Internal Medicine

## 2014-07-29 ENCOUNTER — Encounter: Payer: Self-pay | Admitting: Internal Medicine

## 2014-07-29 DIAGNOSIS — R251 Tremor, unspecified: Secondary | ICD-10-CM

## 2014-07-29 DIAGNOSIS — R51 Headache: Secondary | ICD-10-CM

## 2014-07-29 DIAGNOSIS — R259 Unspecified abnormal involuntary movements: Secondary | ICD-10-CM

## 2014-07-29 MED ORDER — ESOMEPRAZOLE MAGNESIUM 40 MG PO CPDR: 40.0000 mg | DELAYED_RELEASE_CAPSULE | Freq: Every day | ORAL | Status: DC

## 2014-07-29 MED ORDER — TOPIRAMATE 50 MG PO TABS
50.0000 mg | ORAL_TABLET | Freq: Two times a day (BID) | ORAL | Status: DC
Start: 2014-07-29 — End: 2014-09-20

## 2014-07-29 NOTE — Assessment & Plan Note (Signed)
Start Topamax - low dose

## 2014-07-29 NOTE — Progress Notes (Signed)
Pre visit review using our clinic review tool, if applicable. No additional management support is needed unless otherwise documented below in the visit note. 

## 2014-07-29 NOTE — Progress Notes (Signed)
Subjective:    HPI  C/o HAs - nothing helps  F/up -- psychogenic tremor (Dr Tat) and throat spasms are worse now again; memory is better.  Pt was seeing Dr Cheryln Manly for a conversion disorder - not any longer... She had an EEG and an MRI.   F/u: Tremor of face and hands  Active Problems:  OBESITY, MORBID  ANXIETY  DEPRESSION  Dyspnea      The results of significant diagnostics from this hospitalization (including imaging, microbiology, ancillary and laboratory) are listed below for reference.   Significant Diagnostic Studies:  Dg Chest 2 View  01/12/2014 CLINICAL DATA: Shortness of breath, nausea EXAM: CHEST 2 VIEW COMPARISON: January 11, 2014 FINDINGS: The heart size and mediastinal contours are within normal limits. There is no focal infiltrate, pulmonary edema, or pleural effusion. The visualized skeletal structures are stable. IMPRESSION: No active cardiopulmonary disease. Electronically Signed By: Abelardo Diesel M.D. On: 01/12/2014 02:16  Dg Chest 2 View  01/11/2014 CLINICAL DATA: Shortness of breath EXAM: CHEST 2 VIEW COMPARISON: 04/27/2013 FINDINGS: The heart size and mediastinal contours are within normal limits. Both lungs are clear. The visualized skeletal structures are unremarkable. IMPRESSION: No active cardiopulmonary disease. Electronically Signed By: Inez Catalina M.D. On: 01/11/2014 16:08  Dg Lumbar Spine Complete  01/11/2014 CLINICAL DATA: Low back pain for several months. EXAM: LUMBAR SPINE - COMPLETE 4+ VIEW COMPARISON: None. FINDINGS: There is no evidence of lumbar spine fracture. Alignment is normal. Intervertebral disc spaces are maintained. IMPRESSION: Negative exam. Electronically Signed By: Inge Rise M.D. On: 01/11/2014 20:37  Ct Head Wo Contrast  01/12/2014 CLINICAL DATA: Headache and dizziness. Tremors. EXAM: CT HEAD WITHOUT CONTRAST TECHNIQUE: Contiguous axial images were obtained from the base of the skull through the vertex without intravenous  contrast. COMPARISON: None. FINDINGS: No mass lesion, mass effect, midline shift, hydrocephalus, hemorrhage. No territorial ischemia or acute infarction. Paranasal sinuses appear within normal limits. Mastoid air cells clear. IMPRESSION: Negative CT head. Electronically Signed By: Dereck Ligas M.D. On: 01/12/2014 02:30  Ct Soft Tissue Neck W Contrast  01/12/2014 CLINICAL DATA: Throat pain. Headache, dizziness and tremors. Shortness of breath. EXAM: CT NECK WITH CONTRAST TECHNIQUE: Multidetector CT imaging of the neck was performed using the standard protocol following the bolus administration of intravenous contrast. CONTRAST: 4mL OMNIPAQUE IOHEXOL 300 MG/ML SOLN COMPARISON: CT HEAD W/O CM dated 01/12/2014; CT C SPINE W/O CM dated 09/01/2013; CT HEAD W/CM dated 09/01/2013 FINDINGS: Study is degraded by large amount of artifact from corpectomy and ACDF device extending from on C4 through C6 and terminating over T1. Allowing for this artifact, the chest and thoracic inlet appear within normal limits. Small symmetric Level 2 lymph nodes are present which are probably reactive to upper were airway process. There is a small prevertebral effusion. There is no peritonsillar abscess. Bones demonstrate C2-C3 and C3-C4 degenerative disease which appears similar to the prior exam. No cervical spine fracture or or acute abnormality is identified. Mild adenoidal hypertrophy is present. IMPRESSION: 1. Study degraded by long segment cervical spine stabilization hardware. 2. Small prevertebral effusion, most commonly associated with upper respiratory infection. No abscess. 3. Symmetric bilateral level 2 lymphadenopathy is probably reactive. Electronically Signed By: Dereck Ligas M.D. On: 01/12/2014 02:34  Microbiology:   Review of Systems  Constitutional: Positive for fatigue. Negative for chills, activity change, appetite change and unexpected weight change.  HENT: Negative for congestion, mouth sores, rhinorrhea and  sinus pressure.   Eyes: Negative for visual disturbance.  Respiratory: Negative for cough and  chest tightness.   Gastrointestinal: Negative for nausea, vomiting and abdominal pain.  Genitourinary: Negative for frequency, difficulty urinating and vaginal pain.  Musculoskeletal: Positive for back pain and neck pain. Negative for gait problem.  Skin: Negative for pallor and rash.  Allergic/Immunologic: Negative for immunocompromised state.  Neurological: Positive for dizziness, tremors, weakness and light-headedness. Negative for seizures, syncope, numbness and headaches.  Psychiatric/Behavioral: Negative for suicidal ideas, confusion and sleep disturbance. The patient is nervous/anxious.        Objective:   Physical Exam  Constitutional: She appears well-developed. No distress.  Obese  HENT:  Head: Normocephalic.  Right Ear: External ear normal.  Left Ear: External ear normal.  Nose: Nose normal.  Mouth/Throat: Oropharynx is clear and moist.  Eyes: Conjunctivae are normal. Pupils are equal, round, and reactive to light. Right eye exhibits no discharge. Left eye exhibits no discharge.  Neck: Normal range of motion. Neck supple. No JVD present. No tracheal deviation present. No thyromegaly present.  Cardiovascular: Normal rate, regular rhythm and normal heart sounds.   Pulmonary/Chest: No stridor. No respiratory distress. She has no wheezes.  Abdominal: Soft. Bowel sounds are normal. She exhibits no distension and no mass. There is no tenderness. There is no rebound and no guarding.  Musculoskeletal: She exhibits tenderness (neck). She exhibits no edema.  Lymphadenopathy:    She has no cervical adenopathy.  Neurological: She displays normal reflexes. No cranial nerve deficit. She exhibits normal muscle tone. Coordination abnormal.  Head and hands tremor - much less dramatic (would disappear when distracted) - better Face tics - resolved Less dramatic ataxia and dysbalance - much  better Dysarthric w/spastic speech - resolved  Skin: No rash noted. No erythema. No pallor.  Psychiatric: She has a normal mood and affect. Judgment and thought content normal.   Lab Results  Component Value Date   WBC 11.0* 07/02/2014   HGB 13.3 07/02/2014   HCT 39.8 07/02/2014   PLT 343 07/02/2014   GLUCOSE 99 07/02/2014   CHOL 261* 01/08/2010   TRIG 210.0* 01/08/2010   HDL 51.40 01/08/2010   LDLDIRECT 170.1 01/08/2010   ALT 32 05/25/2014   AST 28 05/25/2014   NA 140 07/02/2014   K 3.9 07/02/2014   CL 102 07/02/2014   CREATININE 0.73 07/02/2014   BUN 11 07/02/2014   CO2 21 07/02/2014   TSH 1.056 01/12/2014   INR 1.04 05/25/2014   HGBA1C 5.2 01/08/2010        Assessment & Plan:

## 2014-07-29 NOTE — Patient Instructions (Signed)
IT band stretching

## 2014-09-20 ENCOUNTER — Encounter: Payer: Self-pay | Admitting: *Deleted

## 2014-09-20 ENCOUNTER — Ambulatory Visit (INDEPENDENT_AMBULATORY_CARE_PROVIDER_SITE_OTHER): Payer: Medicare Other | Admitting: Pulmonary Disease

## 2014-09-20 ENCOUNTER — Encounter: Payer: Self-pay | Admitting: Pulmonary Disease

## 2014-09-20 VITALS — BP 124/78 | HR 71 | Temp 98.7°F | Ht 64.0 in | Wt 230.6 lb

## 2014-09-20 DIAGNOSIS — G4733 Obstructive sleep apnea (adult) (pediatric): Secondary | ICD-10-CM

## 2014-09-20 DIAGNOSIS — Z23 Encounter for immunization: Secondary | ICD-10-CM | POA: Diagnosis not present

## 2014-09-20 DIAGNOSIS — Z9989 Dependence on other enabling machines and devices: Secondary | ICD-10-CM

## 2014-09-20 NOTE — Progress Notes (Signed)
   Subjective:    Patient ID: Paula [name], female    DOB: 05-13-1961, 53 y.o.   MRN: [id number]  HPI  PCP - plotnikov   53 year old ex-smoker presents for FU of obstructive sleep apnea.  She had a sleep study in 2002 and was placed on CPAP 9cm with a nasal mask, unknown settings, supplier-[facility]. She had good results with improvement in her somnolence and energy levels. She noted mold in her humidifier and stopped using. 03/2014 Supplied with new autoCPAP with pillows ([facility])   She was hospitalized in 01/2014 for tremors and speech disorder, head CT negative in 09/2013 and 01/2014, was evaluated by neurology without clear diagnosis ? Dystonia   Significant tests/ events  PSG 01/2014 - AHI 8/h, RDI 2/h , lowest 79%   Download 04/2014 - no residuals, avg pr 11 cm, good usage >7h, no leak    09/20/2014  Chief Complaint  Patient presents with  . Sleep Apnea    Discuss taking CPAP on cruise; Wearing CPAP 30/30 days, 8 hours nightly; No complaints   Feels much improved, had to go through 2 masks before finding right one Download 09/2014 - avg pr 11, no residuals, no leak, good usage > 8h Husband states -no snoring, wakes up rested, occ daytime nap, but not sleepy otherwise Wt stable' Cough resolved She is planning a [place] cruise in [place] neg for any significant sore throat, dysphagia, itching, sneezing, nasal congestion or excess/ purulent secretions, fever, chills, sweats, unintended wt loss, pleuritic or exertional cp, hempoptysis, orthopnea pnd or change in chronic leg swelling. Also denies presyncope, palpitations, heartburn, abdominal pain, nausea, vomiting, diarrhea or change in bowel or urinary habits, dysuria,hematuria, rash, arthralgias, visual complaints, headache, numbness weakness or ataxia.     Objective:   Physical Exam  Gen. Pleasant, obese, in no distress ENT - no lesions, no post nasal drip Neck: No JVD, no thyromegaly, no carotid  bruits Lungs: no use of accessory muscles, no dullness to percussion, decreased without rales or rhonchi  Cardiovascular: Rhythm regular, heart sounds  normal, no murmurs or gallops, no peripheral edema Musculoskeletal: No deformities, no cyanosis or clubbing , no tremors        Assessment & Plan:

## 2014-09-20 NOTE — Patient Instructions (Signed)
Your CPAP is on auto settings - working well Letter for plane travel FLu shot

## 2014-09-21 NOTE — Assessment & Plan Note (Signed)
Your CPAP is on auto settings - working well Letter for plane travel FLu shot  Weight loss encouraged, compliance with goal of at least 6 hrs every night is the expectation. Advised against medications with sedative side effects Cautioned against driving when sleepy - understanding that sleepiness will vary on a day to day basis

## 2014-09-27 ENCOUNTER — Encounter (HOSPITAL_COMMUNITY): Payer: Self-pay | Admitting: *Deleted

## 2014-09-27 ENCOUNTER — Encounter: Payer: Self-pay | Admitting: Cardiology

## 2014-09-27 ENCOUNTER — Ambulatory Visit (INDEPENDENT_AMBULATORY_CARE_PROVIDER_SITE_OTHER): Payer: Medicare Other | Admitting: Cardiology

## 2014-09-27 VITALS — BP 142/90 | HR 76 | Ht 64.0 in | Wt 229.5 lb

## 2014-09-27 DIAGNOSIS — R9431 Abnormal electrocardiogram [ECG] [EKG]: Secondary | ICD-10-CM

## 2014-09-27 DIAGNOSIS — R0602 Shortness of breath: Secondary | ICD-10-CM | POA: Diagnosis not present

## 2014-09-27 DIAGNOSIS — R002 Palpitations: Secondary | ICD-10-CM

## 2014-09-27 NOTE — Patient Instructions (Signed)
Your physician recommends that you schedule a follow-up appointment in: one year with Dr. Percival Spanish  We are ordering a stress test  We are ordering Blood Work

## 2014-09-27 NOTE — Progress Notes (Signed)
HPI The patient presents for evaluation of dyspnea and palpitations. I saw her in 2001. I don't have these records. She has had a heart murmur. However, she has not had any other cardiovascular problems. She is somewhat inactive from orthopedic problems with bursitis and sciatica. However, she's able to walk with a cane. With her level of activity she does shortness of breath. This has been progressive with mild activity. She has a little shortness of breath at night but she wears CPAP. She says with her shortness of breath she stops what she is doing if she does recover. She denies any chest pressure, neck or arm discomfort. She's not had any or syncope. However, she has had some palpitations and occasional lightheadedness. He seemed to be somewhat sporadic.  Allergies  Allergen Reactions  . Aspirin Other (See Comments)    Has history of ulcers   . Gabapentin     Wt gain, tremor  . Hydrocodone-Acetaminophen Nausea And Vomiting  . Imitrex [Sumatriptan]     Body hurts  . Moxifloxacin Itching  . Oxycodone Nausea And Vomiting    Current Outpatient Prescriptions  Medication Sig Dispense Refill  . cholecalciferol (VITAMIN D) 1000 UNITS tablet Take 1 tablet (1,000 Units total) by mouth daily.  100 tablet  3  . esomeprazole (NEXIUM) 40 MG capsule Take 1 capsule (40 mg total) by mouth daily.  90 capsule  2  . PRESCRIPTION MEDICATION Apply 1 application topically daily. Sample Medication given at [facility]. Estrogen gel. appy to shoulders daily      . vitamin E 100 UNIT capsule Take 100 Units by mouth daily.       No current facility-administered medications for this visit.    Past Medical History  Diagnosis Date  . Anemia   . Anxiety   . Depression   . GERD (gastroesophageal reflux disease)   . Hyperlipidemia   . Hypertension   . Thyroid disease     nodule  . Ulcer   . Diverticulosis   . Sleep apnea     on C-pap machine  . Arthritis   . Vertigo   . Hernia   . IBS (irritable  bowel syndrome)   . Obesity   . Tremor   . Osteoarthritis   . Migraine   . Restless leg     Past Surgical History  Procedure Laterality Date  . Cholecystectomy    . Cesarean section    . Cervical fusion    . Inguinal hernia repair    . Partial hysterectomy      Family History  Problem Relation Age of Onset  . Colon cancer Maternal Grandfather   . Hypertension Maternal Grandfather   . Cancer Maternal Grandfather     lung  . Hyperlipidemia Mother   . Hypertension Mother   . Hypertension Maternal Grandmother   . Hypertension Brother   . Hypertension Sister   . Cancer Maternal Aunt     lung    History   Social History  . Marital Status: Married    Spouse Name: N/A    Number of Children: N/A  . Years of Education: N/A   Occupational History  . Not on file.   Social History Main Topics  . Smoking status: Former Smoker -- 20 years    Types: Cigarettes    Quit date: 07/24/2008  . Smokeless tobacco: Never Used     Comment: 1 pack per week  . Alcohol Use: No  . Drug Use: No  .  Sexual Activity: Yes    Birth Control/ Protection: Surgical   Other Topics Concern  . Not on file   Social History Narrative  . No narrative on file    ROS:  As stated in the HPI and negative for all other systems.  PHYSICAL EXAM BP 142/90  Pulse 76  Ht 5\' 4"  (1.626 m)  Wt 229 lb 8 oz (104.101 kg)  BMI 39.37 kg/m2 GENERAL:  Well appearing HEENT:  Pupils equal round and reactive, fundi not visualized, oral mucosa unremarkable NECK:  No jugular venous distention, waveform within normal limits, carotid upstroke brisk and symmetric, no bruits, no thyromegaly LYMPHATICS:  No cervical, inguinal adenopathy LUNGS:  Clear to auscultation bilaterally BACK:  No CVA tenderness CHEST:  Unremarkable HEART:  PMI not displaced or sustained,S1 and S2 within normal limits, no S3, no S4, no clicks, no rubs, no murmurs ABD:  Flat, positive bowel sounds normal in frequency in pitch, no bruits, no  rebound, no guarding, no midline pulsatile mass, no hepatomegaly, no splenomegaly EXT:  2 plus pulses throughout, no edema, no cyanosis no clubbing SKIN:  No rashes no nodules NEURO:  Cranial nerves II through XII grossly intact, motor grossly intact throughout PSYCH:  Cognitively intact, oriented to person place and time   EKG:  Sinus rhythm, rate 76, axis within normal limits, intervals within normal limits, poor anterior R wave progression, inferolateral T wave changes cannot exclude ischemia, no distant EKGs for comparison. This is unchanged compared with recent EKGs. 09/27/2014   ASSESSMENT AND PLAN   DYSPNEA:  Given this complaint and her abnormal EKG stress testing is indicated. She would not be a walk on a treadmill. Therefore, she will have a [employer].  I will also check a BNP.  PALPITATIONS:  She does have these. Further evaluation will be pending the results of the ischemia workup.  She wants to avoid medications and if she has a normal heart remain treat this conservatively.

## 2014-09-28 LAB — BRAIN NATRIURETIC PEPTIDE: Brain Natriuretic Peptide: 7.6 pg/mL (ref 0.0–100.0)

## 2014-09-30 ENCOUNTER — Telehealth: Payer: Self-pay | Admitting: Cardiology

## 2014-09-30 NOTE — Telephone Encounter (Signed)
Returning Call about Lab results

## 2014-09-30 NOTE — Telephone Encounter (Signed)
Spoke with pt, pt aware of lab results

## 2014-10-03 ENCOUNTER — Encounter: Payer: Self-pay | Admitting: Cardiology

## 2014-10-03 ENCOUNTER — Telehealth: Payer: Self-pay | Admitting: Internal Medicine

## 2014-10-03 NOTE — Telephone Encounter (Signed)
Use Advil 400 mg bid and Sudafed 12 h tab qd-bid on the day of flying. They are OTC Thx

## 2014-10-03 NOTE — Telephone Encounter (Signed)
Pt flying on 11/7 pt requesting something for her ears during flight. Pt states MD has prescribed something in the past. [facility].

## 2014-10-04 ENCOUNTER — Telehealth (HOSPITAL_COMMUNITY): Payer: Self-pay

## 2014-10-04 NOTE — Telephone Encounter (Signed)
Notified pt with md response. Pt states she can not take advil due to stomach ulcers & issues. Wanting md to recommend something else....Paula [name]

## 2014-10-04 NOTE — Telephone Encounter (Signed)
Encounter complete. 

## 2014-10-04 NOTE — Telephone Encounter (Signed)
Use Tylenol 500 m2 2 tabs tid instead of Advil pls Thx

## 2014-10-05 NOTE — Telephone Encounter (Signed)
Notified pt with md response.../lmb 

## 2014-10-06 ENCOUNTER — Ambulatory Visit (HOSPITAL_COMMUNITY)
Admission: RE | Admit: 2014-10-06 | Discharge: 2014-10-06 | Disposition: A | Discharge status: Home / Self Care | Payer: Medicare Other | Source: Ambulatory Visit | Attending: Cardiology | Admitting: Cardiology

## 2014-10-06 ENCOUNTER — Telehealth (HOSPITAL_COMMUNITY): Payer: Self-pay

## 2014-10-06 DIAGNOSIS — E669 Obesity, unspecified: Secondary | ICD-10-CM | POA: Diagnosis not present

## 2014-10-06 DIAGNOSIS — E785 Hyperlipidemia, unspecified: Secondary | ICD-10-CM | POA: Diagnosis not present

## 2014-10-06 DIAGNOSIS — R5383 Other fatigue: Secondary | ICD-10-CM | POA: Insufficient documentation

## 2014-10-06 DIAGNOSIS — R002 Palpitations: Secondary | ICD-10-CM | POA: Diagnosis not present

## 2014-10-06 DIAGNOSIS — I10 Essential (primary) hypertension: Secondary | ICD-10-CM | POA: Insufficient documentation

## 2014-10-06 DIAGNOSIS — Z8249 Family history of ischemic heart disease and other diseases of the circulatory system: Secondary | ICD-10-CM | POA: Insufficient documentation

## 2014-10-06 DIAGNOSIS — R9431 Abnormal electrocardiogram [ECG] [EKG]: Secondary | ICD-10-CM

## 2014-10-06 DIAGNOSIS — R0609 Other forms of dyspnea: Secondary | ICD-10-CM | POA: Insufficient documentation

## 2014-10-06 DIAGNOSIS — R42 Dizziness and giddiness: Secondary | ICD-10-CM | POA: Insufficient documentation

## 2014-10-06 DIAGNOSIS — R079 Chest pain, unspecified: Secondary | ICD-10-CM | POA: Diagnosis not present

## 2014-10-06 MED ORDER — TECHNETIUM TC 99M SESTAMIBI GENERIC - CARDIOLITE
29.1000 | Freq: Once | INTRAVENOUS | Status: AC | PRN
  Administered 2014-10-06: 29.1 via INTRAVENOUS

## 2014-10-06 MED ORDER — AMINOPHYLLINE 25 MG/ML IV SOLN
75.0000 mg | Freq: Once | INTRAVENOUS | Status: AC
  Administered 2014-10-06: 75 mg via INTRAVENOUS

## 2014-10-06 MED ORDER — TECHNETIUM TC 99M SESTAMIBI GENERIC - CARDIOLITE
10.3000 | Freq: Once | INTRAVENOUS | Status: AC | PRN
  Administered 2014-10-06: 10 via INTRAVENOUS

## 2014-10-06 MED ORDER — REGADENOSON 0.4 MG/5ML IV SOLN
0.4000 mg | Freq: Once | INTRAVENOUS | Status: AC
  Administered 2014-10-06: 0.4 mg via INTRAVENOUS

## 2014-10-06 NOTE — Telephone Encounter (Signed)
Encounter complete. 

## 2014-10-06 NOTE — Procedures (Addendum)
 [facility] [address] [place] [address] [phone]  Cardiology Nuclear Med Study  Paula [name] is a 53 y.o. female     MRN : [id number]     DOB: 1961/08/03  Procedure Date: 10/06/2014  Nuclear Med Background Indication for Stress Test:  Evaluation for Ischemia and Abnormal EKG History:  No cardiac or respiratory history reported;No prior NUC MPI fo rcomparison. Cardiac Risk Factors: History of Smoking, Hypertension, Lipids and Obesity  Symptoms:  Chest Pain, Dizziness, DOE, Fatigue, Light-Headedness, Palpitations and SOB   Nuclear Pre-Procedure Caffeine/Decaff Intake:  1:00am NPO After: 11am   IV Site: R Forearm  IV 0.9% NS with Angio Cath:  22g  Chest Size (in):  n/a IV Started by: Rolene Course, RN  Height: 5\' 4"  (1.626 m)  Cup Size: C  BMI:  Body mass index is 39.29 kg/(m^2). Weight:  229 lb (103.874 kg)   Tech Comments:  n/a    Nuclear Med Study 1 or 2 day study: 1 day  Stress Test Type:  [place] Provider:  Minus Breeding, MD   Resting Radionuclide: Technetium 51m Sestamibi  Resting Radionuclide Dose: 10.3 mCi   Stress Radionuclide:  Technetium 79m Sestamibi  Stress Radionuclide Dose: 29.1 mCi           Stress Protocol Rest HR: 59 Stress HR: 93  Rest BP: 143/86 Stress BP: 143/86  Exercise Time (min): n/a METS: n/a          Dose of Adenosine (mg):  n/a Dose of Lexiscan: 0.4 mg  Dose of Atropine (mg): n/a Dose of Dobutamine: n/a mcg/kg/min (at max HR)  Stress Test Technologist: Mellody Memos, CCT Nuclear Technologist: Imagene Riches, CNMT   Rest Procedure:  Myocardial perfusion imaging was performed at rest 45 minutes following the intravenous administration of Technetium 57m Sestamibi. Stress Procedure:  The patient received IV Lexiscan 0.4 mg over 15-seconds.  Technetium 66m Sestamibi injected at 30-seconds.  Patient experienced shortness of breath and was administered 75 mg of  Aminophylline IV. There were no significant changes with Lexiscan.  Quantitative spect images were obtained after a 45 minute delay.  Transient Ischemic Dilatation (Normal <1.22):  1.11  QGS EDV:  94 ml QGS ESV:  31 ml LV Ejection Fraction: 67%        Rest ECG: NSR with non-specific ST-T wave changes  Stress ECG: No significant ST segment change suggestive of ischemia.  QPS Raw Data Images:  Acquisition technically good; normal left ventricular size. Stress Images:  Normal homogeneous uptake in all areas of the myocardium. Rest Images:  Normal homogeneous uptake in all areas of the myocardium. Subtraction (SDS):  No evidence of ischemia.  Impression Exercise Capacity:  Lexiscan with no exercise. BP Response:  Normal blood pressure response. Clinical Symptoms:  There is dyspnea. ECG Impression:  No significant ST segment change suggestive of ischemia. Comparison with Prior Nuclear Study: No previous nuclear study performed  Overall Impression:  Normal stress nuclear study.  LV Wall Motion:  NL LV Function; NL Wall Motion   Kirk Ruths, MD  10/06/2014 5:24 PM

## 2014-10-17 ENCOUNTER — Telehealth: Payer: Self-pay | Admitting: Cardiology

## 2014-10-17 NOTE — Telephone Encounter (Signed)
Calling to get her test results , Please Call

## 2014-10-17 NOTE — Telephone Encounter (Signed)
The pt has been given her Stress test results, she verbalized understanding.

## 2014-10-28 ENCOUNTER — Ambulatory Visit: Payer: Self-pay | Admitting: Internal Medicine

## 2014-11-07 ENCOUNTER — Encounter: Payer: Self-pay | Admitting: Internal Medicine

## 2014-11-07 ENCOUNTER — Ambulatory Visit (INDEPENDENT_AMBULATORY_CARE_PROVIDER_SITE_OTHER): Payer: Medicare Other | Admitting: Internal Medicine

## 2014-11-07 VITALS — BP 132/80 | HR 75 | Temp 98.6°F | Resp 20 | Ht 64.0 in | Wt 233.5 lb

## 2014-11-07 DIAGNOSIS — B079 Viral wart, unspecified: Secondary | ICD-10-CM | POA: Diagnosis not present

## 2014-11-07 DIAGNOSIS — I10 Essential (primary) hypertension: Secondary | ICD-10-CM

## 2014-11-07 DIAGNOSIS — R251 Tremor, unspecified: Secondary | ICD-10-CM

## 2014-11-07 DIAGNOSIS — F4321 Adjustment disorder with depressed mood: Secondary | ICD-10-CM | POA: Diagnosis not present

## 2014-11-07 MED ORDER — TOPIRAMATE 25 MG PO TABS: 25.0000 mg | ORAL_TABLET | Freq: Two times a day (BID) | ORAL | Status: DC

## 2014-11-07 NOTE — Assessment & Plan Note (Signed)
Wt Readings from Last 3 Encounters:  11/07/14 233 lb 8 oz (105.915 kg)  10/06/14 229 lb (103.874 kg)  09/27/14 229 lb 8 oz (104.101 kg)    Low carb diet

## 2014-11-07 NOTE — Assessment & Plan Note (Signed)
BP is ok 

## 2014-11-07 NOTE — Patient Instructions (Addendum)
Leg Cramps Leg cramps that occur during exercise can be caused by poor circulation or dehydration. However, muscle cramps that occur at rest or during the night are usually not due to any serious medical problem. Heat cramps may cause muscle spasms during hot weather.  CAUSES There is no clear cause for muscle cramps. However, dehydration may be a factor for those who do not drink enough fluids and those who exercise in the heat. Imbalances in the level of sodium, potassium, calcium or magnesium in the muscle tissue may also be a factor. Some medications, such as water pills (diuretics), may cause loss of chemicals that the body needs (like sodium and potassium) and cause muscle cramps. TREATMENT   Make sure your diet has enough fluids and essential minerals for the muscle to work normally.  Avoid strenuous exercise for several days if you have been having frequent leg cramps.  Stretch and massage the cramped muscle for several minutes.  Some medicines may be helpful in some patients with night cramps. Only take over-the-counter or prescription medicines as directed by your caregiver. SEEK IMMEDIATE MEDICAL CARE IF:   Your leg cramps become worse.  Your foot becomes cold, numb, or blue. Document Released: 12/26/2004 Document Revised: 02/10/2012 Document Reviewed: 12/13/2008 [facility] Patient Information 2015 [place], [place]. This information is not intended to replace advice given to you by your health care provider. Make sure you discuss any questions you have with your health care provider.    Postprocedure instructions :     Keep the wounds clean. You can wash them with liquid soap and water. Pat dry with gauze or a Kleenex tissue  Before applying antibiotic ointment and a Band-Aid.   You need to report immediately  if  any signs of infection develop.

## 2014-11-07 NOTE — Progress Notes (Signed)
Pre visit review using our clinic review tool, if applicable. No additional management support is needed unless otherwise documented below in the visit note. 

## 2014-11-07 NOTE — Progress Notes (Signed)
Subjective:    HPI  C/o abd bloating; it is getting bigger - Why?  F/u HAs - nothing helps  F/up -- psychogenic tremor (Dr Tat) and throat spasms are worse now again; memory is better.  Pt was seeing Dr Cheryln Manly for a conversion disorder - not any longer... She had an EEG and an MRI.  Wt Readings from Last 3 Encounters:  11/07/14 233 lb 8 oz (105.915 kg)  10/06/14 229 lb (103.874 kg)  09/27/14 229 lb 8 oz (104.101 kg)    BP Readings from Last 3 Encounters:  11/07/14 132/80  09/27/14 142/90  09/20/14 124/78      F/u: Tremor of face and hands  Active Problems:  OBESITY, MORBID  ANXIETY  DEPRESSION  Dyspnea      The results of significant diagnostics from this hospitalization (including imaging, microbiology, ancillary and laboratory) are listed below for reference.   Significant Diagnostic Studies:  Dg Chest 2 View  01/12/2014 CLINICAL DATA: Shortness of breath, nausea EXAM: CHEST 2 VIEW COMPARISON: January 11, 2014 FINDINGS: The heart size and mediastinal contours are within normal limits. There is no focal infiltrate, pulmonary edema, or pleural effusion. The visualized skeletal structures are stable. IMPRESSION: No active cardiopulmonary disease. Electronically Signed By: Abelardo Diesel M.D. On: 01/12/2014 02:16  Dg Chest 2 View  01/11/2014 CLINICAL DATA: Shortness of breath EXAM: CHEST 2 VIEW COMPARISON: 04/27/2013 FINDINGS: The heart size and mediastinal contours are within normal limits. Both lungs are clear. The visualized skeletal structures are unremarkable. IMPRESSION: No active cardiopulmonary disease. Electronically Signed By: Inez Catalina M.D. On: 01/11/2014 16:08  Dg Lumbar Spine Complete  01/11/2014 CLINICAL DATA: Low back pain for several months. EXAM: LUMBAR SPINE - COMPLETE 4+ VIEW COMPARISON: None. FINDINGS: There is no evidence of lumbar spine fracture. Alignment is normal. Intervertebral disc spaces are maintained. IMPRESSION: Negative exam.  Electronically Signed By: Inge Rise M.D. On: 01/11/2014 20:37  Ct Head Wo Contrast  01/12/2014 CLINICAL DATA: Headache and dizziness. Tremors. EXAM: CT HEAD WITHOUT CONTRAST TECHNIQUE: Contiguous axial images were obtained from the base of the skull through the vertex without intravenous contrast. COMPARISON: None. FINDINGS: No mass lesion, mass effect, midline shift, hydrocephalus, hemorrhage. No territorial ischemia or acute infarction. Paranasal sinuses appear within normal limits. Mastoid air cells clear. IMPRESSION: Negative CT head. Electronically Signed By: Dereck Ligas M.D. On: 01/12/2014 02:30  Ct Soft Tissue Neck W Contrast  01/12/2014 CLINICAL DATA: Throat pain. Headache, dizziness and tremors. Shortness of breath. EXAM: CT NECK WITH CONTRAST TECHNIQUE: Multidetector CT imaging of the neck was performed using the standard protocol following the bolus administration of intravenous contrast. CONTRAST: 28mL OMNIPAQUE IOHEXOL 300 MG/ML SOLN COMPARISON: CT HEAD W/O CM dated 01/12/2014; CT C SPINE W/O CM dated 09/01/2013; CT HEAD W/CM dated 09/01/2013 FINDINGS: Study is degraded by large amount of artifact from corpectomy and ACDF device extending from on C4 through C6 and terminating over T1. Allowing for this artifact, the chest and thoracic inlet appear within normal limits. Small symmetric Level 2 lymph nodes are present which are probably reactive to upper were airway process. There is a small prevertebral effusion. There is no peritonsillar abscess. Bones demonstrate C2-C3 and C3-C4 degenerative disease which appears similar to the prior exam. No cervical spine fracture or or acute abnormality is identified. Mild adenoidal hypertrophy is present. IMPRESSION: 1. Study degraded by long segment cervical spine stabilization hardware. 2. Small prevertebral effusion, most commonly associated with upper respiratory infection. No abscess. 3. Symmetric bilateral level 2 lymphadenopathy  is probably reactive.  Electronically Signed By: Dereck Ligas M.D. On: 01/12/2014 02:34  Microbiology:   Review of Systems  Constitutional: Positive for fatigue. Negative for chills, activity change, appetite change and unexpected weight change.  HENT: Negative for congestion, mouth sores, rhinorrhea and sinus pressure.   Eyes: Negative for visual disturbance.  Respiratory: Negative for cough and chest tightness.   Gastrointestinal: Negative for nausea, vomiting and abdominal pain.  Genitourinary: Negative for frequency, difficulty urinating and vaginal pain.  Musculoskeletal: Positive for back pain and neck pain. Negative for gait problem.  Skin: Negative for pallor and rash.  Allergic/Immunologic: Negative for immunocompromised state.  Neurological: Positive for dizziness, tremors, weakness and light-headedness. Negative for seizures, syncope, numbness and headaches.  Psychiatric/Behavioral: Negative for suicidal ideas, confusion and sleep disturbance. The patient is nervous/anxious.   face wart x2     Objective:   Physical Exam Lab Results  Component Value Date   WBC 11.0* 07/02/2014   HGB 13.3 07/02/2014   HCT 39.8 07/02/2014   PLT 343 07/02/2014   GLUCOSE 99 07/02/2014   CHOL 261* 01/08/2010   TRIG 210.0* 01/08/2010   HDL 51.40 01/08/2010   LDLDIRECT 170.1 01/08/2010   ALT 32 05/25/2014   AST 28 05/25/2014   NA 140 07/02/2014   K 3.9 07/02/2014   CL 102 07/02/2014   CREATININE 0.73 07/02/2014   BUN 11 07/02/2014   CO2 21 07/02/2014   TSH 1.056 01/12/2014   INR 1.04 05/25/2014   HGBA1C 5.2 01/08/2010    Procedure Note :     Procedure : Cryosurgery   Indication:  Wart(s)     Risks including unsuccessful procedure , bleeding, infection, bruising, scar, a need for a repeat  procedure and others were explained to the patient in detail as well as the benefits. Informed consent was obtained verbally.   2 lesion(s)  on face   was/were treated with liquid nitrogen on a Q-tip in a usual  fasion . Band-Aid was applied and antibiotic ointment was given for a later use.   Tolerated well. Complications none.   Postprocedure instructions :     Keep the wounds clean. You can wash them with liquid soap and water. Pat dry with gauze or a Kleenex tissue  Before applying antibiotic ointment and a Band-Aid.   You need to report immediately  if  any signs of infection develop.        Assessment & Plan:

## 2014-11-07 NOTE — Assessment & Plan Note (Signed)
Husband w/PTSD Start topamax

## 2014-11-07 NOTE — Assessment & Plan Note (Signed)
Cryo  

## 2014-11-07 NOTE — Assessment & Plan Note (Signed)
2/15 Psychosomatic tremors Dr Carles Collet

## 2014-11-08 ENCOUNTER — Telehealth: Payer: Self-pay | Admitting: Internal Medicine

## 2014-11-08 NOTE — Telephone Encounter (Signed)
emmi emailed °

## 2014-11-23 ENCOUNTER — Ambulatory Visit (INDEPENDENT_AMBULATORY_CARE_PROVIDER_SITE_OTHER): Payer: Medicare Other | Admitting: Internal Medicine

## 2014-11-23 ENCOUNTER — Encounter: Payer: Self-pay | Admitting: Internal Medicine

## 2014-11-23 ENCOUNTER — Ambulatory Visit: Payer: Self-pay | Admitting: Internal Medicine

## 2014-11-23 VITALS — BP 124/90 | HR 73 | Temp 98.4°F | Wt 234.2 lb

## 2014-11-23 DIAGNOSIS — R202 Paresthesia of skin: Secondary | ICD-10-CM

## 2014-11-23 DIAGNOSIS — R4689 Other symptoms and signs involving appearance and behavior: Secondary | ICD-10-CM | POA: Insufficient documentation

## 2014-11-23 DIAGNOSIS — L299 Pruritus, unspecified: Secondary | ICD-10-CM | POA: Diagnosis not present

## 2014-11-23 NOTE — Progress Notes (Signed)
Pre visit review using our clinic review tool, if applicable. No additional management support is needed unless otherwise documented below in the visit note. 

## 2014-11-23 NOTE — Patient Instructions (Signed)
Apply Cort Aid OTC twice a day to the involved tissues. Do not get this topical steroid into eyes. Use hypoallergenic cleansing motions.  Your next office appointment will be determined based upon review of your pending labs . Those instructions will be transmitted to you through My Chart    Followup as needed for your acute issue. Please report any significant change in your symptoms.

## 2014-11-23 NOTE — Progress Notes (Signed)
   Subjective:    Patient ID: Paula [name], female    DOB: 1961-04-15, 53 y.o.   MRN: [id number]  HPI She's had itching in a small area of the left upper medial extremity for 10 days. She's been rubbing this with alcohol. The itching will awaken her. There has been no associated rash. Actually since she made the appointment the itching has improved in the last 24 hours   She also describes constant tingling for several months to up to a year in the left upper extremity and left lower extremity.She thought the leg symptoms were due to L hip bursitis.Allergies include Gabapentin . She thought this had been prescribed for anxiety.  This has resulted in her limping and causing imbalance. She states she will often fall in to the wall while ambulating.She has been using a cane when walking.  The tingling will extend from the neck down to the hand and from the hip to the foot.  She states that her husband has to help her dress. She finds it "impossible to cross (my) legs".  PMH of cervical fusion. Diagnosed with conversion disorder manifested as psychosomatic tremors.   Review of Systems  No associated itchy, watery eyes.  Swelling of the lips or tongue denied.  Shortness of breath, wheezing, or cough absent.  No rash or urticaria noted.  Fever ,chills , or sweats denied. Purulence absent.  Diarrhea not present.  She's been gaining weight.  There is no loss of control of  bladder or bowels.    Objective:   Physical Exam  Positive or pertinent findings include:  As per CDC Guidelines ,Epic documents severe obesity as being present . Abdomen is protuberant. She has decreased strength to opposition in the left upper extremity. She is unable to raise the left upper extremity above shoulder level. When she walks she limps on the left.  General appearance :adequately nourished; in no distress. Eyes: No conjunctival inflammation or scleral icterus is present. Heart:  Normal rate  and regular rhythm. S1 and S2 normal without gallop, murmur, click, rub or other extra sounds   Lungs:Chest clear to auscultation; no wheezes, rhonchi,rales ,or rubs present.No increased work of breathing.  Skin:Warm & dry.  Intact without suspicious lesions or rashes ; no  Tenting. Small benign ("been there for years") dark nevus L medial upper arm.  Lymphatic: No lymphadenopathy is noted about the head, neck, axilla.           Assessment & Plan:  #1 paresthesias of LUE & LLE in context of PMH cervical fusion #2 PMH of conversion disorder Plan: B12, A1c, TSH,RPR Neuro referral because of significant past medical hx (cx fusion) ? EMG/NCT

## 2014-11-28 ENCOUNTER — Other Ambulatory Visit (INDEPENDENT_AMBULATORY_CARE_PROVIDER_SITE_OTHER): Payer: Medicare Other

## 2014-11-28 DIAGNOSIS — R202 Paresthesia of skin: Secondary | ICD-10-CM | POA: Diagnosis not present

## 2014-11-28 LAB — TSH: TSH: 2.18 u[IU]/mL (ref 0.35–4.50)

## 2014-11-28 LAB — HEMOGLOBIN A1C: Hgb A1c MFr Bld: 5.5 % (ref 4.6–6.5)

## 2014-11-28 LAB — VITAMIN B12: VITAMIN B 12: 255 pg/mL (ref 211–911)

## 2014-11-28 LAB — RPR

## 2014-11-29 ENCOUNTER — Telehealth: Payer: Self-pay | Admitting: *Deleted

## 2014-11-29 ENCOUNTER — Other Ambulatory Visit: Payer: Self-pay | Admitting: Internal Medicine

## 2014-11-29 DIAGNOSIS — E538 Deficiency of other specified B group vitamins: Secondary | ICD-10-CM

## 2014-11-29 NOTE — Telephone Encounter (Signed)
-----   Message from Hendricks Limes, MD sent at 11/29/2014  9:21 AM EST ----- Please verify that results are being checked through My Chart. If  not using My Chart; results will be mailed & My Chart should be closed.Thanks, [employer]

## 2014-11-29 NOTE — Telephone Encounter (Signed)
Pt informed of 11/28/14 results. She is on MyChart and she is aware to come back to lab to have methylmalonic acid level drawn.

## 2014-11-30 ENCOUNTER — Other Ambulatory Visit: Payer: Medicare Other

## 2014-11-30 DIAGNOSIS — E538 Deficiency of other specified B group vitamins: Secondary | ICD-10-CM | POA: Diagnosis not present

## 2014-12-03 LAB — METHYLMALONIC ACID, SERUM: METHYLMALONIC ACID, QUANT: 99 nmol/L (ref 87–318)

## 2014-12-06 ENCOUNTER — Encounter: Payer: Self-pay | Admitting: Internal Medicine

## 2014-12-07 ENCOUNTER — Other Ambulatory Visit: Payer: Self-pay | Admitting: Internal Medicine

## 2014-12-07 ENCOUNTER — Ambulatory Visit (INDEPENDENT_AMBULATORY_CARE_PROVIDER_SITE_OTHER): Payer: Medicare Other | Admitting: Internal Medicine

## 2014-12-07 ENCOUNTER — Encounter: Payer: Self-pay | Admitting: Internal Medicine

## 2014-12-07 VITALS — BP 118/90 | HR 68 | Temp 98.5°F | Ht 64.0 in | Wt 238.1 lb

## 2014-12-07 DIAGNOSIS — Z8719 Personal history of other diseases of the digestive system: Secondary | ICD-10-CM | POA: Diagnosis not present

## 2014-12-07 DIAGNOSIS — R590 Localized enlarged lymph nodes: Secondary | ICD-10-CM

## 2014-12-07 DIAGNOSIS — E538 Deficiency of other specified B group vitamins: Secondary | ICD-10-CM | POA: Insufficient documentation

## 2014-12-07 MED ORDER — VITAMIN B-12 500 MCG SL SUBL: SUBLINGUAL_TABLET | SUBLINGUAL | Status: DC

## 2014-12-07 NOTE — Patient Instructions (Signed)
The ENT referral will be scheduled and you'll be notified of the time.Please call the Referral Co-Ordinator @ [phone] if you have not been notified of appointment time within 7-10 days.

## 2014-12-07 NOTE — Progress Notes (Signed)
Pre visit review using our clinic review tool, if applicable. No additional management support is needed unless otherwise documented below in the visit note. 

## 2014-12-07 NOTE — Progress Notes (Signed)
   Subjective:    Patient ID: Paula [name], female    DOB: 1961/07/26, 54 y.o.   MRN: [id number]  HPI  She describes migratory "dots" on the tongue which vary in location. She has not been able to visualize any specific lesion  She feels she has a new or larger lesion below the left jaw  She feels this affects how she can hold her tongue and affects speech She has had some decreased hearing on the right.  She also describes some blurred vision. She has no other cranial nerve deficit.  She believes she's had anemia in the past. Chart review revealed no anemia on labs from the hospital in August 2015.   She recently had B12 levels which were low normal. Methylmalonic acid levels were also @ low limits of normal. Follow-up in 6 months was recommended   Review of Systems She denies fever, chills, sweats  She is concerned about weight gain.    Objective:   Physical Exam  Pertinent or positive findings include: There is prominence of the venous vascularity sublingually but this does not appear pathologic She has large crypts in her tonsils. The submandibular glands are easily palpable but again do not appear to be pathologic No abnormal lymphadenopathy about the neck or axilla.  General appearance:adequately nourished; no acute distress or increased work of breathing is present.  Eyes: No conjunctival inflammation or lid edema is present. There is no scleral icterus. Ears:  External ear exam shows no significant lesions or deformities.  Otoscopic examination reveals clear canals, tympanic membranes are intact bilaterally without bulging, retraction, inflammation or discharge. Nose:  External nasal examination shows no deformity or inflammation. Nasal mucosa are pink and moist without lesions or exudates. No septal dislocation or deviation.No obstruction to airflow.  Oral exam: Dental hygiene is good; lips and gums are healthy appearing.There is no oropharyngeal erythema or exudate  noted.  Neck:  No deformities, thyromegaly, masses, or tenderness noted.   Supple with full range of motion without pain.  Heart:  Normal rate and regular rhythm. S1 and S2 normal without gallop, murmur, click, rub or other extra sounds.  Lungs:Chest clear to auscultation; no wheezes, rhonchi,rales ,or rubs present.No increased work of breathing.   Extremities:  No cyanosis, edema, or clubbing  noted  Skin: Warm & dry w/o jaundice or tenting.       Assessment & Plan:  #1 subjective intraoral lesions; no pathology visualized   #2 submandibular lymphadenopathy  Plan: ENT evaluation  CBC and differential.

## 2014-12-08 ENCOUNTER — Other Ambulatory Visit (INDEPENDENT_AMBULATORY_CARE_PROVIDER_SITE_OTHER): Payer: Medicare Other

## 2014-12-08 DIAGNOSIS — Z8719 Personal history of other diseases of the digestive system: Secondary | ICD-10-CM

## 2014-12-08 DIAGNOSIS — R590 Localized enlarged lymph nodes: Secondary | ICD-10-CM | POA: Diagnosis not present

## 2014-12-08 LAB — CBC WITH DIFFERENTIAL/PLATELET
BASOS PCT: 0.3 % (ref 0.0–3.0)
Basophils Absolute: 0 10*3/uL (ref 0.0–0.1)
EOS ABS: 0.4 10*3/uL (ref 0.0–0.7)
Eosinophils Relative: 4 % (ref 0.0–5.0)
HEMATOCRIT: 37.8 % (ref 36.0–46.0)
HEMOGLOBIN: 12.3 g/dL (ref 12.0–15.0)
Lymphocytes Relative: 41.9 % (ref 12.0–46.0)
Lymphs Abs: 4 10*3/uL (ref 0.7–4.0)
MCHC: 32.5 g/dL (ref 30.0–36.0)
MCV: 88 fl (ref 78.0–100.0)
Monocytes Absolute: 0.6 10*3/uL (ref 0.1–1.0)
Monocytes Relative: 5.8 % (ref 3.0–12.0)
NEUTROS ABS: 4.6 10*3/uL (ref 1.4–7.7)
Neutrophils Relative %: 48 % (ref 43.0–77.0)
Platelets: 357 10*3/uL (ref 150.0–400.0)
RBC: 4.29 Mil/uL (ref 3.87–5.11)
RDW: 12.6 % (ref 11.5–15.5)
WBC: 9.6 10*3/uL (ref 4.0–10.5)

## 2014-12-09 ENCOUNTER — Encounter: Payer: Self-pay | Admitting: Neurology

## 2014-12-12 ENCOUNTER — Ambulatory Visit (INDEPENDENT_AMBULATORY_CARE_PROVIDER_SITE_OTHER): Payer: Medicare Other | Admitting: Neurology

## 2014-12-12 ENCOUNTER — Encounter: Payer: Self-pay | Admitting: Neurology

## 2014-12-12 VITALS — BP 122/72 | HR 64 | Ht 64.0 in | Wt 236.0 lb

## 2014-12-12 DIAGNOSIS — M5442 Lumbago with sciatica, left side: Secondary | ICD-10-CM

## 2014-12-12 DIAGNOSIS — R208 Other disturbances of skin sensation: Secondary | ICD-10-CM | POA: Diagnosis not present

## 2014-12-12 DIAGNOSIS — M542 Cervicalgia: Secondary | ICD-10-CM | POA: Diagnosis not present

## 2014-12-12 DIAGNOSIS — R209 Unspecified disturbances of skin sensation: Secondary | ICD-10-CM

## 2014-12-12 NOTE — Progress Notes (Signed)
Subjective:    Paula [name] was seen in consultation in the movement disorder clinic at the request of [name], MD.  The evaluation is for tremor.  She was seen for the same as as in patient and those records, including the neurology records, were reviewed.  The pt is accompanied by her sister who supplements the history.    The patient is a 54 y.o. right handed female with a history of tremor.  Pt reports that tremor started acutely in early Feb, 2015 after drinking a diet supplement that she made herself.   She states that it was just a combination of vinegar, cayenne pepper, lemon and water.  She had 2 dosages of it.  After the 2nd dose, she began to have acute onset of tremor in the head and her throat felt like it was caving in, as if she couldn't breathe.  She was near syncopal.  She went to the ER and was sent home.  2 days later, she went to UC because of the same issue and she was sent to the ER.  She was admitted.  Ddx was between some type of dystonia and possible psychogenic etiology.  She was given propranolol but that caused bradycardia and dizziness.  She was then started on "2 seizure medications."  They started on gabapentin and klonopin but she ran out of the klonopin on Friday.  She does think that they help some.  She had one episode yesterday where she felt that she could not breathe and prior to that she had not had a similar episode in 2 weeks.  Her sister states that she also noted new onset stuttering speech.  There is no hx of similar.  There is no family hx of tremor.    04/21/14 update:   Pt is f/u today for testing results.  Her MRI brain with and without gad was normal.  Her routine EEG was normal.  Her ambulatory EEG did demonstrate L temporal slowing, without epileptiform activity.  This is nonspecific but there were no structural lesions on MRI to account for the changes on EEG.  Pt states that she is "some" better than she was but about the same.  She is seeing  behavioral health but only been there twice and the pt was frustrated.  After 2 sessions the pt was dx with bipolar and there was a recommendation to the PCP to start medication and the pt states that she hasn't yet been back yet to her PCP.  She cx appt tomorrow with behavioral health as she just didn't feel that she had a good connection with the counselor.  She called the place in [place] that saw conversion but it was very expensive and she couldn't afford it.   She watches 2 babies and states that her husband "drives her crazy" as he deals with PTSD.   She doesn't feel that he supports her in her struggles and illness, however.  Some days she speaks well and some days she is not able to speak well.  She does notice that as she becomes more stressed, the speech and tremor get worse.  She has knee pain on the L and hip pain on the L and so she continues to use the cane.  She is not taking time for herself.  She has noted deterioration of memory.   12/12/14 update:  Pt returns today.  Since last visit she saw Tammi Sou at [facility] for a second opinion.  He agreed that  she had psychogenic tremor and psychogenic speech impairment.  She states that he took her off of the gabapentin because she was told it could cause weight gain.  Pt states that she has been doing well; sx's have "tapered away" and sometimes her speech will give her trouble.   I reviewed PCP notes and she was c/o LUE/LLE paresthesias.  She had an MRI brain in 05/2014 and I reviewed that again and it was normal.  Pt states that her fingers get so swollen that she cannot get her rings on them.  She has pain/stiffness down the entire lateral aspect of the leg.  It is an ache, not a shooting pain and she has a similar feeling in the left hand and arm.  If vacuuming, she will have pain in the back but otherwise does okay.  Feels like she has a lump in her neck but more anteriorally and more inside and "under the tongue."  States that she would have spots on  the tongue and the spots would show up different areas every day. Some neck pain.   She does not think that the gabapentin being d/c made any difference in these sx's.  She states that these sx's have been going on for over a year (and MRI brain done since then).    Outside reports reviewed: historical medical records and referral letter/letters.  Allergies  Allergen Reactions  . Aspirin Other (See Comments)    Has history of ulcers   . Gabapentin     Wt gain, tremor  . Hydrocodone-Acetaminophen Nausea And Vomiting  . Imitrex [Sumatriptan]     Body hurts  . Moxifloxacin Itching  . Oxycodone Nausea And Vomiting    Current Outpatient Prescriptions on File Prior to Visit  Medication Sig Dispense Refill  . Calcium Carb-Ergocalciferol (CHEWABLE CALCIUM/D PO) Take 2 each by mouth daily.    . Cyanocobalamin (VITAMIN B-12) 500 MCG SUBL 1 sl qd 100 tablet 3  . esomeprazole (NEXIUM) 40 MG capsule Take 1 capsule (40 mg total) by mouth daily. 90 capsule 2  . PRESCRIPTION MEDICATION Apply 1 application topically daily. Sample Medication given at [facility]. Estrogen gel. appy to shoulders daily    . topiramate (TOPAMAX) 25 MG tablet Take 1 tablet (25 mg total) by mouth 2 (two) times daily. 60 tablet 5  . vitamin E 100 UNIT capsule Take 100 Units by mouth daily.     No current facility-administered medications on file prior to visit.    Past Medical History  Diagnosis Date  . Anemia   . Anxiety   . Depression   . GERD (gastroesophageal reflux disease)   . Hyperlipidemia   . Hypertension   . Thyroid disease     nodule  . Ulcer   . Diverticulosis   . Sleep apnea     on C-pap machine  . Arthritis   . Vertigo   . Hernia   . IBS (irritable bowel syndrome)   . Obesity   . Tremor   . Osteoarthritis   . Migraine   . Restless leg     Past Surgical History  Procedure Laterality Date  . Cholecystectomy    . Cesarean section    . Cervical fusion    . Inguinal hernia repair    .  Partial hysterectomy      History   Social History  . Marital Status: Married    Spouse Name: N/A    Number of Children: N/A  . Years of  Education: N/A   Occupational History  . Not on file.   Social History Main Topics  . Smoking status: Former Smoker -- 20 years    Types: Cigarettes    Quit date: 07/24/2008  . Smokeless tobacco: Never Used     Comment: 1 pack per week  . Alcohol Use: No  . Drug Use: No  . Sexual Activity: Yes    Birth Control/ Protection: Surgical   Other Topics Concern  . Not on file   Social History Narrative    Family Status  Relation Status Death Age  . Maternal Grandfather Deceased   . Mother Alive 81    hyperlipidemia  . Father Alive 18    healthy  . Maternal Aunt Deceased   . Sister Alive   . Brother Alive   . Son Alive   . Daughter Alive     Review of Systems States that she has L knee and L hip pain.  Has AM hand paresthesias and some AM hand swelling.   complete 10 system ROS was obtained and was negative apart from what is mentioned.   Objective:   VITALS:  Wt Readings from Last 3 Encounters:  12/12/14 236 lb (107.049 kg)  12/07/14 238 lb 2 oz (108.013 kg)  11/23/14 234 lb 4 oz (106.255 kg)     Gen:  Appears stated age and in NAD. HEENT:  [place]/AT.  MMM. CV:  RRR Lungs:  CTAB  NEUROLOGICAL:  Orientation:  The patient is alert and oriented x 3.  Recent and remote memory are intact.  Attention span and concentration are normal.  Able to name objects and repeat without trouble.  Fund of knowledge is appropriate Cranial nerves: There is good facial symmetry. The pupils are equal round and reactive to light bilaterally. Fundoscopic exam reveals clear disc margins bilaterally. Extraocular muscles are intact and visual fields are full to confrontational testing. Speech is fluent without any stuttering today.  No dysarthria.  It is clear. Soft palate rises symmetrically and there is no tongue deviation. Hearing is intact to  conversational tone. Tone: Tone is good throughout. Coordination:  The patient has no dysdiadichokinesia or dysmetria. Motor: Strength is 5/5 in the RUE/RLE.  She has significant give way weakness in the LUE and LLE that improves with encouragement but strength is at least 4/5 in the LUE/LLE.  Shoulder shrug is equal bilaterally.  There is no pronator drift.  There are no fasciculations noted. Gait and Station: The patient no longer is using a cane.  She walks well although she has some trouble ambulating in a tandem fashion.    MOVEMENT EXAM: Tremor:  There is a rare head tremor that goes away with distration.  No hand tremor.  LABS:  Lab Results  Component Value Date   TSH 2.18 11/28/2014   Lab Results  Component Value Date   WBC 9.6 12/08/2014   HGB 12.3 12/08/2014   HCT 37.8 12/08/2014   MCV 88.0 12/08/2014   PLT 357.0 12/08/2014     Chemistry      Component Value Date/Time   NA 140 07/02/2014 2212   K 3.9 07/02/2014 2212   CL 102 07/02/2014 2212   CO2 21 07/02/2014 2212   BUN 11 07/02/2014 2212   CREATININE 0.73 07/02/2014 2212      Component Value Date/Time   CALCIUM 10.3 07/02/2014 2212   ALKPHOS 89 05/25/2014 1350   AST 28 05/25/2014 1350   ALT 32 05/25/2014 1350   BILITOT  0.4 05/25/2014 1350     Lab Results  Component Value Date   VITAMINB12 255 11/28/2014        Assessment/Plan:   1.  Psychogenic Tremor  -. She has had a second opinion at the [facility] and their diagnosis was the same, as well as psychogenic speech impairment.  She stopped seeing Dr. Cheryln Manly.  Feels that she is doing better in that regard 2.  LUE/LLE paresthesias  -some non-physiologic aspects today  -We will do an EMG  -We will do an MRI cervical spine and L-spine  -could lay consideration to rheum c/s if negative.  Will leave to PCP discretion.

## 2014-12-12 NOTE — Patient Instructions (Addendum)
1. We will call you with an appt for your EMG. 2. We have scheduled you at [place] for your MRI on 12/18/14 at 10:00 am. Please arrive 30 minutes prior and go to [place]. If you need to change this appt please call [phone].

## 2014-12-18 ENCOUNTER — Ambulatory Visit
Admission: RE | Admit: 2014-12-18 | Discharge: 2014-12-18 | Disposition: A | Discharge status: Home / Self Care | Payer: Medicare Other | Source: Ambulatory Visit | Attending: Neurology | Admitting: Neurology

## 2014-12-18 DIAGNOSIS — R209 Unspecified disturbances of skin sensation: Secondary | ICD-10-CM

## 2014-12-18 DIAGNOSIS — M47813 Spondylosis without myelopathy or radiculopathy, cervicothoracic region: Secondary | ICD-10-CM | POA: Diagnosis not present

## 2014-12-18 DIAGNOSIS — M47817 Spondylosis without myelopathy or radiculopathy, lumbosacral region: Secondary | ICD-10-CM | POA: Diagnosis not present

## 2014-12-18 DIAGNOSIS — M542 Cervicalgia: Secondary | ICD-10-CM

## 2014-12-18 DIAGNOSIS — M4322 Fusion of spine, cervical region: Secondary | ICD-10-CM | POA: Diagnosis not present

## 2014-12-18 DIAGNOSIS — M5442 Lumbago with sciatica, left side: Secondary | ICD-10-CM

## 2014-12-18 DIAGNOSIS — M4802 Spinal stenosis, cervical region: Secondary | ICD-10-CM | POA: Diagnosis not present

## 2014-12-19 ENCOUNTER — Telehealth: Payer: Self-pay | Admitting: Neurology

## 2014-12-19 NOTE — Telephone Encounter (Signed)
Let pt know that MRI L-spine looked okay.  Is some disc/degenerative changes on the cervical MRI at C3-4 that has advanced some since previous surgery.  While I don't think that this is causing the sx's that she talked to me about, she may want to get neurosurgeon opinion.  Who did prior sx?  Can refer her to that person.

## 2014-12-19 NOTE — Telephone Encounter (Signed)
Patient made aware of MR results. Previous surgery in 2005 by Dr Christella Noa. She is requesting opinion by different MD. Referral sent to [facility] at [phone] with confirmation received. They are to call patient directly with an appt.

## 2014-12-22 ENCOUNTER — Telehealth: Payer: Self-pay | Admitting: Internal Medicine

## 2014-12-22 NOTE — Telephone Encounter (Signed)
Pt has no Rx coverage. She is looking into patient assistance for Nexium. She wants to know what to do. Any cheaper alternatives/generics?

## 2014-12-22 NOTE — Telephone Encounter (Signed)
Paula [name] can:  1. Teacher, early years/pre pt assistance program for free Nexium.  2. She can use generic Omeprazole 20 mg 2 tab/d instead Thx

## 2014-12-22 NOTE — Telephone Encounter (Signed)
Pt request sample Nexium, pt stated it is too expensive for her and she doesn't have medicare part D. Please advise.

## 2014-12-27 ENCOUNTER — Telehealth: Payer: Self-pay | Admitting: Neurology

## 2014-12-27 NOTE — Telephone Encounter (Signed)
Left message on machine for patient to call back.

## 2014-12-27 NOTE — Telephone Encounter (Signed)
That is certainly up to her.  It is something I recommend, but if she wants to proceed with neurosurgeon first that is okay.  Not sure that she will get answers but I also am not going to make her do something she doesn't want to do.

## 2014-12-27 NOTE — Telephone Encounter (Signed)
Has not heard back from surgeon's office regarding an appt. Also wants to know if the NCV/EMG is absolutely necessary-she is having anxiety about it. Please call back at [phone] / Sherri S.

## 2014-12-27 NOTE — Telephone Encounter (Signed)
Actually left message with [facility] this morning to check on status of appt.   Dr Tat - please advise on EMG.

## 2014-12-27 NOTE — Telephone Encounter (Signed)
Called to check on referral with [facility]. LMOM with new patient coordinator to call back with status.

## 2014-12-28 NOTE — Telephone Encounter (Signed)
Received note from [facility] that patient is scheduled with Dr Kathyrn Sheriff on 12/29/2014 at 3:00 pm and has been notified of appt.

## 2014-12-28 NOTE — Telephone Encounter (Signed)
Patient aware I left a message with [facility]. She was given the number to reach them directly about appt if she has not heard from them by Friday. Patient also made aware that we recommend EMG but can not make her have the test done, that it is ultimately her decision. EMG cancelled and she will let us know if she needs anything from [place].

## 2014-12-29 DIAGNOSIS — M47812 Spondylosis without myelopathy or radiculopathy, cervical region: Secondary | ICD-10-CM | POA: Diagnosis not present

## 2014-12-30 NOTE — Telephone Encounter (Signed)
Pt informed

## 2015-01-03 DIAGNOSIS — R490 Dysphonia: Secondary | ICD-10-CM | POA: Diagnosis not present

## 2015-01-03 DIAGNOSIS — K140 Glossitis: Secondary | ICD-10-CM | POA: Diagnosis not present

## 2015-01-03 DIAGNOSIS — R070 Pain in throat: Secondary | ICD-10-CM | POA: Diagnosis not present

## 2015-01-09 ENCOUNTER — Encounter: Payer: Self-pay | Admitting: Neurology

## 2015-01-11 DIAGNOSIS — Z6841 Body Mass Index (BMI) 40.0 and over, adult: Secondary | ICD-10-CM | POA: Diagnosis not present

## 2015-01-11 DIAGNOSIS — F329 Major depressive disorder, single episode, unspecified: Secondary | ICD-10-CM | POA: Diagnosis not present

## 2015-01-11 DIAGNOSIS — F419 Anxiety disorder, unspecified: Secondary | ICD-10-CM | POA: Diagnosis not present

## 2015-01-11 DIAGNOSIS — N959 Unspecified menopausal and perimenopausal disorder: Secondary | ICD-10-CM | POA: Diagnosis not present

## 2015-02-06 ENCOUNTER — Ambulatory Visit: Payer: Self-pay | Admitting: Internal Medicine

## 2015-02-09 ENCOUNTER — Telehealth: Payer: Self-pay | Admitting: Pulmonary Disease

## 2015-02-09 DIAGNOSIS — G4733 Obstructive sleep apnea (adult) (pediatric): Secondary | ICD-10-CM

## 2015-02-09 NOTE — Telephone Encounter (Signed)
Order has been placed for supplies. Pt is aware.

## 2015-02-27 DIAGNOSIS — F411 Generalized anxiety disorder: Secondary | ICD-10-CM | POA: Diagnosis not present

## 2015-03-20 DIAGNOSIS — R109 Unspecified abdominal pain: Secondary | ICD-10-CM | POA: Diagnosis not present

## 2015-03-20 DIAGNOSIS — F410 Panic disorder [episodic paroxysmal anxiety] without agoraphobia: Secondary | ICD-10-CM | POA: Diagnosis not present

## 2015-03-20 DIAGNOSIS — R197 Diarrhea, unspecified: Secondary | ICD-10-CM | POA: Diagnosis not present

## 2015-03-20 DIAGNOSIS — R140 Abdominal distension (gaseous): Secondary | ICD-10-CM | POA: Diagnosis not present

## 2015-04-10 DIAGNOSIS — F329 Major depressive disorder, single episode, unspecified: Secondary | ICD-10-CM | POA: Diagnosis not present

## 2015-04-10 DIAGNOSIS — K30 Functional dyspepsia: Secondary | ICD-10-CM | POA: Diagnosis not present

## 2015-04-10 DIAGNOSIS — F411 Generalized anxiety disorder: Secondary | ICD-10-CM | POA: Diagnosis not present

## 2015-04-27 ENCOUNTER — Encounter: Payer: Self-pay | Admitting: Internal Medicine

## 2015-04-27 ENCOUNTER — Encounter: Payer: Self-pay | Admitting: Gastroenterology

## 2015-05-09 DIAGNOSIS — K219 Gastro-esophageal reflux disease without esophagitis: Secondary | ICD-10-CM | POA: Diagnosis not present

## 2015-05-09 DIAGNOSIS — R197 Diarrhea, unspecified: Secondary | ICD-10-CM | POA: Diagnosis not present

## 2015-05-09 DIAGNOSIS — R635 Abnormal weight gain: Secondary | ICD-10-CM | POA: Diagnosis not present

## 2015-05-11 ENCOUNTER — Other Ambulatory Visit: Payer: Self-pay

## 2015-05-11 DIAGNOSIS — Z1231 Encounter for screening mammogram for malignant neoplasm of breast: Secondary | ICD-10-CM

## 2015-05-22 ENCOUNTER — Emergency Department (HOSPITAL_COMMUNITY): Payer: Medicare Other

## 2015-05-22 ENCOUNTER — Emergency Department (HOSPITAL_COMMUNITY)
Admission: EM | Admit: 2015-05-22 | Discharge: 2015-05-22 | Disposition: A | Discharge status: Home / Self Care | Payer: Medicare Other | Attending: Emergency Medicine | Admitting: Emergency Medicine

## 2015-05-22 ENCOUNTER — Encounter (HOSPITAL_COMMUNITY): Payer: Self-pay | Admitting: Emergency Medicine

## 2015-05-22 DIAGNOSIS — K219 Gastro-esophageal reflux disease without esophagitis: Secondary | ICD-10-CM | POA: Insufficient documentation

## 2015-05-22 DIAGNOSIS — F329 Major depressive disorder, single episode, unspecified: Secondary | ICD-10-CM | POA: Diagnosis not present

## 2015-05-22 DIAGNOSIS — E669 Obesity, unspecified: Secondary | ICD-10-CM | POA: Diagnosis not present

## 2015-05-22 DIAGNOSIS — G43909 Migraine, unspecified, not intractable, without status migrainosus: Secondary | ICD-10-CM

## 2015-05-22 DIAGNOSIS — Z862 Personal history of diseases of the blood and blood-forming organs and certain disorders involving the immune mechanism: Secondary | ICD-10-CM | POA: Insufficient documentation

## 2015-05-22 DIAGNOSIS — R251 Tremor, unspecified: Secondary | ICD-10-CM | POA: Diagnosis not present

## 2015-05-22 DIAGNOSIS — Z9981 Dependence on supplemental oxygen: Secondary | ICD-10-CM | POA: Insufficient documentation

## 2015-05-22 DIAGNOSIS — G4730 Sleep apnea, unspecified: Secondary | ICD-10-CM | POA: Diagnosis not present

## 2015-05-22 DIAGNOSIS — R51 Headache: Secondary | ICD-10-CM | POA: Diagnosis present

## 2015-05-22 DIAGNOSIS — Z87891 Personal history of nicotine dependence: Secondary | ICD-10-CM | POA: Insufficient documentation

## 2015-05-22 DIAGNOSIS — R200 Anesthesia of skin: Secondary | ICD-10-CM | POA: Diagnosis not present

## 2015-05-22 DIAGNOSIS — I10 Essential (primary) hypertension: Secondary | ICD-10-CM | POA: Insufficient documentation

## 2015-05-22 DIAGNOSIS — F419 Anxiety disorder, unspecified: Secondary | ICD-10-CM | POA: Diagnosis not present

## 2015-05-22 DIAGNOSIS — R479 Unspecified speech disturbances: Secondary | ICD-10-CM | POA: Insufficient documentation

## 2015-05-22 DIAGNOSIS — R29898 Other symptoms and signs involving the musculoskeletal system: Secondary | ICD-10-CM | POA: Diagnosis not present

## 2015-05-22 DIAGNOSIS — Z79899 Other long term (current) drug therapy: Secondary | ICD-10-CM | POA: Insufficient documentation

## 2015-05-22 DIAGNOSIS — G43919 Migraine, unspecified, intractable, without status migrainosus: Secondary | ICD-10-CM | POA: Diagnosis not present

## 2015-05-22 LAB — COMPREHENSIVE METABOLIC PANEL
ALBUMIN: 4.2 g/dL (ref 3.5–5.0)
ALT: 38 U/L (ref 14–54)
AST: 30 U/L (ref 15–41)
Alkaline Phosphatase: 76 U/L (ref 38–126)
Anion gap: 7 (ref 5–15)
BUN: 10 mg/dL (ref 6–20)
CALCIUM: 9 mg/dL (ref 8.9–10.3)
CO2: 24 mmol/L (ref 22–32)
CREATININE: 0.68 mg/dL (ref 0.44–1.00)
Chloride: 108 mmol/L (ref 101–111)
GFR calc Af Amer: >60 mL/min (ref >60)
GFR calc non Af Amer: >60 mL/min (ref >60)
Glucose, Bld: 99 mg/dL (ref 65–99)
Potassium: 3.7 mmol/L (ref 3.5–5.1)
SODIUM: 139 mmol/L (ref 135–145)
Total Bilirubin: 0.8 mg/dL (ref 0.3–1.2)
Total Protein: 7 g/dL (ref 6.5–8.1)

## 2015-05-22 LAB — DIFFERENTIAL
BASOS ABS: 0 10*3/uL (ref 0.0–0.1)
BASOS PCT: 0 % (ref 0–1)
EOS ABS: 0.2 10*3/uL (ref 0.0–0.7)
Eosinophils Relative: 3 % (ref 0–5)
Lymphocytes Relative: 48 % — ABNORMAL HIGH (ref 12–46)
Lymphs Abs: 3.9 10*3/uL (ref 0.7–4.0)
MONOS PCT: 5 % (ref 3–12)
Monocytes Absolute: 0.4 10*3/uL (ref 0.1–1.0)
NEUTROS PCT: 44 % (ref 43–77)
Neutro Abs: 3.6 10*3/uL (ref 1.7–7.7)

## 2015-05-22 LAB — URINALYSIS, ROUTINE W REFLEX MICROSCOPIC
Bilirubin Urine: NEGATIVE
Glucose, UA: NEGATIVE mg/dL
Hgb urine dipstick: NEGATIVE
Ketones, ur: NEGATIVE mg/dL
LEUKOCYTES UA: NEGATIVE
Nitrite: NEGATIVE
PROTEIN: NEGATIVE mg/dL
Specific Gravity, Urine: 1.006 (ref 1.005–1.030)
Urobilinogen, UA: 0.2 mg/dL (ref 0.0–1.0)
pH: 7.5 (ref 5.0–8.0)

## 2015-05-22 LAB — CBC
HCT: 40.4 % (ref 36.0–46.0)
Hemoglobin: 12.9 g/dL (ref 12.0–15.0)
MCH: 28.5 pg (ref 26.0–34.0)
MCHC: 31.9 g/dL (ref 30.0–36.0)
MCV: 89.4 fL (ref 78.0–100.0)
PLATELETS: 393 10*3/uL (ref 150–400)
RBC: 4.52 MIL/uL (ref 3.87–5.11)
RDW: 12.5 % (ref 11.5–15.5)
WBC: 8.1 10*3/uL (ref 4.0–10.5)

## 2015-05-22 LAB — ETHANOL: Alcohol, Ethyl (B): <5 mg/dL (ref <5)

## 2015-05-22 MED ORDER — LORAZEPAM 1 MG PO TABS: 1.0000 mg | ORAL_TABLET | Freq: Three times a day (TID) | ORAL | Status: DC | PRN

## 2015-05-22 MED ORDER — KETOROLAC TROMETHAMINE 30 MG/ML IJ SOLN
30.0000 mg | Freq: Once | INTRAMUSCULAR | Status: AC
  Administered 2015-05-22: 30 mg via INTRAVENOUS
  Filled 2015-05-22: qty 1

## 2015-05-22 MED ORDER — VALPROATE SODIUM 500 MG/5ML IV SOLN
500.0000 mg | Freq: Once | INTRAVENOUS | Status: AC
  Administered 2015-05-22: 500 mg via INTRAVENOUS
  Filled 2015-05-22: qty 5

## 2015-05-22 MED ORDER — SODIUM CHLORIDE 0.9 % IV BOLUS (SEPSIS)
1000.0000 mL | Freq: Once | INTRAVENOUS | Status: AC
  Administered 2015-05-22: 1000 mL via INTRAVENOUS

## 2015-05-22 MED ORDER — METOCLOPRAMIDE HCL 5 MG/ML IJ SOLN
10.0000 mg | Freq: Once | INTRAMUSCULAR | Status: AC
  Administered 2015-05-22: 10 mg via INTRAVENOUS
  Filled 2015-05-22: qty 2

## 2015-05-22 MED ORDER — DIPHENHYDRAMINE HCL 50 MG/ML IJ SOLN
25.0000 mg | Freq: Once | INTRAMUSCULAR | Status: AC
  Administered 2015-05-22: 25 mg via INTRAVENOUS
  Filled 2015-05-22 (×2): qty 1

## 2015-05-22 MED ORDER — LORAZEPAM 2 MG/ML IJ SOLN
1.0000 mg | Freq: Once | INTRAMUSCULAR | Status: AC
  Administered 2015-05-22: 1 mg via INTRAVENOUS
  Filled 2015-05-22: qty 1

## 2015-05-22 MED ORDER — MORPHINE SULFATE 4 MG/ML IJ SOLN
4.0000 mg | Freq: Once | INTRAMUSCULAR | Status: AC
  Administered 2015-05-22: 4 mg via INTRAVENOUS
  Filled 2015-05-22: qty 1

## 2015-05-22 NOTE — ED Notes (Signed)
Patient here with husband with complaints of left side arm and leg tingling, headache, and tremors. LSN yesterday around 8pm. Hx of anxiety disorder. New med Zoloft.

## 2015-05-22 NOTE — ED Notes (Signed)
Patient transported to MRI 

## 2015-05-22 NOTE — Discharge Instructions (Signed)

## 2015-05-22 NOTE — ED Provider Notes (Signed)
CSN: [id number]     Arrival date & time 05/22/15  1027 History   First MD Initiated Contact with Patient 05/22/15 [phone]     Chief Complaint  Patient presents with  . Numbness  . Headache     (Consider location/radiation/quality/duration/timing/severity/associated sxs/prior Treatment) HPI Comments: Pt is a 54 y.o. female with Pmhx as above who presents with onset of h/a, tremor, stuttering speech, L sided pain/tingling, LSN by husband at 8pm. Reports hx of similar episode about 1 year ago that was thought be be psychogenic, due to stress. She was started by zoloft 1 month ago. She has seen Dr. Carles Collet with neurology, has had nml MRI brain,  Patient is a 54 y.o. female presenting with headaches.  Headache   Past Medical History  Diagnosis Date  . Anemia   . Anxiety   . Depression   . GERD (gastroesophageal reflux disease)   . Hyperlipidemia   . Hypertension   . Thyroid disease     nodule  . Ulcer   . Diverticulosis   . Sleep apnea     on C-pap machine  . Arthritis   . Vertigo   . Hernia   . IBS (irritable bowel syndrome)   . Obesity   . Tremor   . Osteoarthritis   . Migraine   . Restless leg    Past Surgical History  Procedure Laterality Date  . Cholecystectomy    . Cesarean section    . Cervical fusion    . Inguinal hernia repair    . Partial hysterectomy     Family History  Problem Relation Age of Onset  . Colon cancer Maternal Grandfather   . Hypertension Maternal Grandfather   . Cancer Maternal Grandfather     lung  . Hyperlipidemia Mother   . Hypertension Mother   . Hypertension Maternal Grandmother   . Hypertension Brother   . Hypertension Sister   . Cancer Maternal Aunt     lung   History  Substance Use Topics  . Smoking status: Former Smoker -- 20 years    Types: Cigarettes    Quit date: 07/24/2008  . Smokeless tobacco: Never Used     Comment: 1 pack per week  . Alcohol Use: No   OB History    Gravida Para Term Preterm AB TAB SAB Ectopic  Multiple Living   3 2 2  1 1    2      Review of Systems  Neurological: Positive for tremors, speech difficulty and headaches.      Allergies  Aspirin; Gabapentin; Hydrocodone-acetaminophen; Imitrex; Moxifloxacin; and Oxycodone  Home Medications   Prior to Admission medications   Medication Sig Start Date End Date Taking? Authorizing Provider  esomeprazole (NEXIUM) 40 MG capsule Take 1 capsule (40 mg total) by mouth daily. 07/29/14  Yes Aleksei Plotnikov V, MD  sertraline (ZOLOFT) 50 MG tablet Take 50 mg by mouth daily.   Yes Historical Provider, MD  LORazepam (ATIVAN) 1 MG tablet Take 1 tablet (1 mg total) by mouth 3 (three) times daily as needed for anxiety (and tremor). 05/22/15   Ernestina Patches, MD   BP 137/69 mmHg  Pulse 91  Temp(Src) 98.5 F (36.9 C) (Oral)  Resp 16  SpO2 97% Physical Exam  Constitutional: She is oriented to person, place, and time. She appears well-developed and well-nourished. No distress.  HENT:  Head: Normocephalic and atraumatic.  Mouth/Throat: No oropharyngeal exudate.  Eyes: Pupils are equal, round, and reactive to light.  Neck: Normal  range of motion. Neck supple.  Cardiovascular: Normal rate, regular rhythm and normal heart sounds.  Exam reveals no gallop and no friction rub.   No murmur heard. Pulmonary/Chest: Effort normal and breath sounds normal. No respiratory distress. She has no wheezes. She has no rales.  Abdominal: Soft. Bowel sounds are normal. She exhibits no distension and no mass. There is no tenderness. There is no rebound and no guarding.  Musculoskeletal: Normal range of motion. She exhibits no edema or tenderness.  Neurological: She is alert and oriented to person, place, and time. She has normal strength. She displays tremor. A sensory deficit is present. No cranial nerve deficit. Coordination normal. GCS eye subscore is 4. GCS verbal subscore is 5. GCS motor subscore is 6.  Reflex Scores:      Patellar reflexes are 2+ on the  right side and 2+ on the left side. Tremor at first seen in left hand then seen in right and.  Stuttering, though fluent speech.  Symmetric 4 out of 5 strength in bilateral lower extremities.  Patient reports decreased sensation to the entirety of her left arm and bilateral legs.   Skin: Skin is warm and dry.  Psychiatric: She has a normal mood and affect.    ED Course  Procedures (including critical care time) Labs Review Labs Reviewed  DIFFERENTIAL - Abnormal; Notable for the following:    Lymphocytes Relative 48 (*)    All other components within normal limits  ETHANOL  CBC  URINALYSIS, ROUTINE W REFLEX MICROSCOPIC (NOT AT [facility])  COMPREHENSIVE METABOLIC PANEL    Imaging Review Ct Head Wo Contrast  05/22/2015   CLINICAL DATA:  Left-sided arm pain. Tingling. Headache. Tremors. Onset of symptoms 8 p.m. yesterday.  EXAM: CT HEAD WITHOUT CONTRAST  TECHNIQUE: Contiguous axial images were obtained from the base of the skull through the vertex without intravenous contrast.  COMPARISON:  05/25/2014.  04/18/2014.  FINDINGS: No mass lesion, mass effect, midline shift, hydrocephalus, hemorrhage. No territorial ischemia or acute infarction. Calvarium intact. Visible paranasal sinuses are normal.  IMPRESSION: Negative CT head.   Electronically Signed   By: Dereck Ligas M.D.   On: 05/22/2015 10:59   Mr Brain Wo Contrast  05/22/2015   CLINICAL DATA:  54 year old female with left side extremity tingling. Headaches and tremors. Symptoms since 8 p.m. yesterday. Initial encounter.  EXAM: MRI HEAD WITHOUT CONTRAST  TECHNIQUE: Multiplanar, multiecho pulse sequences of the brain and surrounding structures were obtained without intravenous contrast.  COMPARISON:  Head CT without contrast 1043 hr today. Brain MRI 05/25/2014 and earlier  Cervical spine MRI 12/18/2014  FINDINGS: Stable and normal cerebral volume. Major intracranial vascular flow voids are stable. No restricted diffusion to suggest acute  infarction. No midline shift, mass effect, evidence of mass lesion, ventriculomegaly, extra-axial collection or acute intracranial hemorrhage. Cervicomedullary junction and pituitary are within normal limits. Pearline Cables and white matter signal is within normal limits for age throughout the brain.  Visible internal auditory structures appear normal. Trace left mastoid fluid, significance doubtful. Nasopharynx appear stable and negative. Elsewhere the mastoids and paranasal sinuses are clear. Stable dysconjugate gaze with otherwise normal orbits soft tissues. Visualized scalp soft tissues are within normal limits.  Stable visualized upper cervical spine with disc degeneration and stenosis at C3-C4.  IMPRESSION: 1. Stable and normal for age noncontrast MRI appearance of the brain. 2. C3-C4 degenerative spinal stenosis Re identified, see cervical spine MRI 12/18/2014.   Electronically Signed   By: Herminio Heads.D.  On: 05/22/2015 16:05     EKG Interpretation   Date/Time:  Monday May 22 2015 09:39:50 EDT Ventricular Rate:  75 PR Interval:    QRS Duration: 75 QT Interval:  545 QTC Calculation: 609 R Axis:   28 Text Interpretation:  NSR with artifact, likely due to active tremor.  Low  voltage, precordial leads Borderline T abnormalities, diffuse leads  Prolonged QT interval which is new, otherwise unchanged from prior.   Confirmed by DOCHERTY  MD, [place] [phone]) on 05/22/2015 11:14:59 AM     CLINICAL DATA: Gradual persistent and progressively worsening headache. Stuttering speech and aphasia.  EXAM: MRI HEAD WITHOUT CONTRAST  TECHNIQUE: Multiplanar, multiecho pulse sequences of the brain and surrounding structures were obtained without intravenous contrast.  COMPARISON: CT head without contrast 05/25/2014. MRI brain without and with contrast 04/18/2014.  FINDINGS: No acute infarct, hemorrhage, or mass lesion is present. The ventricles are of normal size. No significant extraaxial  fluid collection is present. Postsurgical changes of the cervical spine are again noted.  Flow is present in the major intracranial arteries. The globes and orbits are intact. The paranasal sinuses and mastoid air cells are clear.  IMPRESSION: 1. Negative MRI of the brain. 2. Postsurgical changes of the cervical spine.   Electronically Signed  By: Lawrence Santiago M.D.  On: 05/25/2014 17:30  MDM   Final diagnoses:  Migraine without status migrainosus, not intractable, unspecified migraine type    Pt is a 54 y.o. female with Pmhx as above who presents with onset of h/a, tremor, stuttering speech, L sided pain/tingling, LSN by husband at 8pm. Reports hx of similar episode about 1 year ago that was thought be be psychogenic, due to stress. She was started by zoloft 1 month ago. She has seen Dr. Carles Collet with neurology, has had nml MRI brain, per Dr. Doristine Devoid note in 3/'15, she had been having episodic tremor, and stuttering speech since feb/'15, symptoms thought to be psychogenic. On PE pt tearfull, appears anxious, but in NAD.  Speech is stuttering, but fluent.  She reports abnormal sensation to the left arm and bilateral legs.  She has no focal neuro findings.  She is complaining of left occipital headache and neck pain.  She denies falls or recent injuries.  She has had no fevers, nausea, vomiting or diarrhea.  Believe symptoms are more likely psychogenic in nature or due to complex migraine and are not consistent with acute stroke, patient also not in TPA window.  Patient be treated with migraine cocktail, Ativan and will get CT head.   CT head nml. Pt felt somewhat improved after tx, stutter is distractible, but paresthesias still present and remained so after additional tx w/ Depakote and opiates. MRI ordered and was negative. Suspect symptoms are psychogenic. Pt will ne d/c'd home with ativan, asked ot f/u with Dr. Carles Collet. Doubt CVA/TIA, SAH, meningitis.          Ernestina Patches,  MD 05/24/15 1039

## 2015-05-22 NOTE — ED Notes (Signed)
MD at beside

## 2015-05-22 NOTE — ED Notes (Signed)
Patient transported to CT 

## 2015-05-22 NOTE — ED Notes (Signed)
Back from MRI.

## 2015-05-22 NOTE — ED Notes (Signed)
Patient speaking normally in complete sentences.

## 2015-05-22 NOTE — ED Notes (Signed)
Awake. Verbally responsive. A/O x4. Resp even and unlabored. No audible adventitious breath sounds noted. ABC's intact.  

## 2015-05-24 ENCOUNTER — Telehealth: Payer: Self-pay | Admitting: Neurology

## 2015-05-24 NOTE — Telephone Encounter (Signed)
Pt called for an appt today, says she was seen at [facility] ER last night and needs to follow up today. Please call back at [phone] / Sherri S.

## 2015-05-24 NOTE — Telephone Encounter (Signed)
Patient was seen in the ER for confusing, blurred vision, left side numbness, headache, uncontrollable to tremors. Patient was told to follow up today with her neurologist today please advise

## 2015-05-24 NOTE — Telephone Encounter (Signed)
She was seen 2 days ago in ER.  MRI brain negative.  Felt likely psychogenic as were previous events.  I cannot see her until next week (monday okay - I'm full today, in surgery tomorrow and full Friday) but please remind her as per our multiple previous discussions that she is going to need counseling that is faithful and continuous if these events are going to ever get better.  Is she doing that?

## 2015-05-24 NOTE — Telephone Encounter (Signed)
Pt has called again, see previous documentation . Sherri

## 2015-05-24 NOTE — Telephone Encounter (Signed)
Patient scheduled for Monday morning. She is not seeing anyone at this time for counseling she has seen two but did not care for either.

## 2015-05-29 ENCOUNTER — Encounter: Payer: Self-pay | Admitting: Neurology

## 2015-05-29 ENCOUNTER — Ambulatory Visit (INDEPENDENT_AMBULATORY_CARE_PROVIDER_SITE_OTHER): Payer: Medicare Other | Admitting: Neurology

## 2015-05-29 VITALS — BP 124/70 | HR 82 | Ht 64.0 in | Wt 236.2 lb

## 2015-05-29 DIAGNOSIS — R270 Ataxia, unspecified: Secondary | ICD-10-CM | POA: Diagnosis not present

## 2015-05-29 DIAGNOSIS — F8081 Childhood onset fluency disorder: Secondary | ICD-10-CM | POA: Diagnosis not present

## 2015-05-29 DIAGNOSIS — F444 Conversion disorder with motor symptom or deficit: Secondary | ICD-10-CM

## 2015-05-29 NOTE — Progress Notes (Signed)
Subjective:    Paula [name] was seen in consultation in the movement disorder clinic at the request of Paula Kehr, MD.  The evaluation is for tremor.  She was seen for the same as as in patient and those records, including the neurology records, were reviewed.  The pt is accompanied by her sister who supplements the history.    The patient is a 54 y.o. right handed female with a history of tremor.  Pt reports that tremor started acutely in early Feb, 2015 after drinking a diet supplement that she made herself.   She states that it was just a combination of vinegar, cayenne pepper, lemon and water.  She had 2 dosages of it.  After the 2nd dose, she began to have acute onset of tremor in the head and her throat felt like it was caving in, as if she couldn't breathe.  She was near syncopal.  She went to the ER and was sent home.  2 days later, she went to UC because of the same issue and she was sent to the ER.  She was admitted.  Ddx was between some type of dystonia and possible psychogenic etiology.  She was given propranolol but that caused bradycardia and dizziness.  She was then started on "2 seizure medications."  They started on gabapentin and klonopin but she ran out of the klonopin on Friday.  She does think that they help some.  She had one episode yesterday where she felt that she could not breathe and prior to that she had not had a similar episode in 2 weeks.  Her sister states that she also noted new onset stuttering speech.  There is no hx of similar.  There is no family hx of tremor.    04/21/14 update:   Pt is f/u today for testing results.  Her MRI brain with and without gad was normal.  Her routine EEG was normal.  Her ambulatory EEG did demonstrate L temporal slowing, without epileptiform activity.  This is nonspecific but there were no structural lesions on MRI to account for the changes on EEG.  Pt states that she is "some" better than she was but about the same.  She is seeing  behavioral health but only been there twice and the pt was frustrated.  After 2 sessions the pt was dx with bipolar and there was a recommendation to the PCP to start medication and the pt states that she hasn't yet been back yet to her PCP.  She cx appt tomorrow with behavioral health as she just didn't feel that she had a good connection with the counselor.  She called the place in [place] that saw conversion but it was very expensive and she couldn't afford it.   She watches 2 babies and states that her husband "drives her crazy" as he deals with PTSD.   She doesn't feel that he supports her in her struggles and illness, however.  Some days she speaks well and some days she is not able to speak well.  She does notice that as she becomes more stressed, the speech and tremor get worse.  She has knee pain on the L and hip pain on the L and so she continues to use the cane.  She is not taking time for herself.  She has noted deterioration of memory.   12/12/14 update:  Pt returns today.  Since last visit she saw Paula [name] at [facility] for a second opinion.  He agreed that  she had psychogenic tremor and psychogenic speech impairment.  She states that he took her off of the gabapentin because she was told it could cause weight gain.  Pt states that she has been doing well; sx's have "tapered away" and sometimes her speech will give her trouble.   I reviewed PCP notes and she was c/o LUE/LLE paresthesias.  She had an MRI brain in 05/2014 and I reviewed that again and it was normal.  Pt states that her fingers get so swollen that she cannot get her rings on them.  She has pain/stiffness down the entire lateral aspect of the leg.  It is an ache, not a shooting pain and she has a similar feeling in the left hand and arm.  If vacuuming, she will have pain in the back but otherwise does okay.  Feels like she has a lump in her neck but more anteriorally and more inside and "under the tongue."  States that she would have spots on  the tongue and the spots would show up different areas every day. Some neck pain.   She does not think that the gabapentin being d/c made any difference in these sx's.  She states that these sx's have been going on for over a year (and MRI brain done since then).    05/29/15 update:  The patient follows up today.  I reviewed records since last visit.  She was in the emergency room on 05/22/2015.  I reviewed those records.  She presented with tremor, stuttering speech and left-sided pain and paresthesias.  Pt states that she woke up that morning and felt that she was having a seizure.  She was shaking all over but was aware of what was happening.  She told her husband to take her to the ED.  Her husband drove her to the ED.  She had an MRI of the brain in the emergency room and this was normal.  I reviewed this.  This is done without gadolinium.  She is with her husband today.  They report that this was very similar to previous episodes.  She has not been going to counseling.  She states that she has tried it twice before (2 different counselors, but only went 1 time with each) and she just did not click with the counselor.  She states that she still is spinning when she closes her eyes and she cannot walk straight.  Her BP is better and speech is better than it was 7 days ago, but admits that this comes and goes.  She admits to be very frustrated.  She worries about "MS, Parkinsons disease."  Her husband states that over the previous year her symptoms would come and go every few months, but over the last week it would come and go daily.  Outside reports reviewed: historical medical records and referral letter/letters.  Current Outpatient Prescriptions on File Prior to Visit  Medication Sig Dispense Refill  . esomeprazole (NEXIUM) 40 MG capsule Take 1 capsule (40 mg total) by mouth daily. 90 capsule 2  . LORazepam (ATIVAN) 1 MG tablet Take 1 tablet (1 mg total) by mouth 3 (three) times daily as needed for  anxiety (and tremor). 15 tablet 0  . sertraline (ZOLOFT) 50 MG tablet Take 50 mg by mouth daily.     No current facility-administered medications on file prior to visit.   Past Medical History  Diagnosis Date  . Anemia   . Anxiety   . Depression   .  GERD (gastroesophageal reflux disease)   . Hyperlipidemia   . Hypertension   . Thyroid disease     nodule  . Ulcer   . Diverticulosis   . Sleep apnea     on C-pap machine  . Arthritis   . Vertigo   . Hernia   . IBS (irritable bowel syndrome)   . Obesity   . Tremor   . Osteoarthritis   . Migraine   . Restless leg    Past Surgical History  Procedure Laterality Date  . Cholecystectomy    . Cesarean section    . Cervical fusion    . Inguinal hernia repair    . Partial hysterectomy     History   Social History  . Marital Status: Married    Spouse Name: N/A  . Number of Children: N/A  . Years of Education: N/A   Occupational History  . Not on file.   Social History Main Topics  . Smoking status: Former Smoker -- 20 years    Types: Cigarettes    Quit date: 07/24/2008  . Smokeless tobacco: Never Used     Comment: 1 pack per week  . Alcohol Use: No  . Drug Use: No  . Sexual Activity: Yes    Birth Control/ Protection: Surgical   Other Topics Concern  . Not on file   Social History Narrative      Review of Systems  complete 10 system ROS was obtained and was negative apart from what is mentioned.   Objective:   VITALS:  Filed Vitals:   05/29/15 0842  BP: 124/70  Pulse: 82    Wt Readings from Last 3 Encounters:  05/29/15 236 lb 4 oz (107.162 kg)  12/18/14 238 lb (107.956 kg)  12/12/14 236 lb (107.049 kg)     Gen:  Appears stated age and in NAD. HEENT:  [place]/AT.  MMM. CV:  RRR Lungs:  CTAB  NEUROLOGICAL:  Orientation:  The patient is alert and oriented x 3.  Cranial nerves: There is good facial symmetry. The pupils are equal round and reactive to light bilaterally. Fundoscopic exam reveals  clear disc margins bilaterally. Extraocular muscles are intact and visual fields are full to confrontational testing. Speech is fluent with stuttering that comes and goes, but by the end of the visit no stuttering was present.  No dysarthria.  Soft palate rises symmetrically and there is no tongue deviation. Hearing is intact to conversational tone. Tone: Tone is good throughout. Coordination:  The patient has no dysdiadichokinesia or dysmetria. Sensation: Sensation is intact to light touch throughout.  There is vibrational splitting, and she actually reports decreased vibration in the right chest compared to that of the left. Motor: Strength is at least 5-/5 throughout, but improved significantly with encouragement.  There is give way weakness. Gait and Station: The patient uses a cane to ambulate she ambulates well. Deep tendon reflexes:2+-3-/4 at the bilateral biceps, triceps, brachioradi  MOVEMENT EXAM: Tremor:  There is a rare head tremor that goes away with distration.  It is sometimes in the yes direction and sometimes in the note direction.  The tremor changes frequency when she is given another task to do and sometimes abates completely.  It goes away when she walks down the hall.  No hand tremor.     Assessment/Plan:   1.  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 She has had a second opinion at the [facility] and their diagnosis  was the same.  -I spent much greater than 50% of this 45 minute visit in counseling with the patient and her husband.  He has not previously attended, so I reviewed what we had talked about at previous visits.  I reviewed with her and her husband the importance of counseling.  She did not feel that she clicked with her previous counselors, so we will refer her to someone different, but I told her that she would need prolonged counseling if she wanted to get better.  -I did tell her that the gait and speech could respond to  physical and speech therapy even if it was psychogenic and I'm going to refer her for those therapies.  -I reviewed with her and her husband the MRI of the brain that was just on an 05/22/2015 in the emergency room.  This was normal. 2.  She will follow-up with me on an as-needed basis.

## 2015-06-01 ENCOUNTER — Encounter: Payer: Self-pay | Admitting: *Deleted

## 2015-06-02 ENCOUNTER — Encounter: Payer: Self-pay | Admitting: Diagnostic Neuroimaging

## 2015-06-02 ENCOUNTER — Ambulatory Visit (INDEPENDENT_AMBULATORY_CARE_PROVIDER_SITE_OTHER): Payer: Medicare Other | Admitting: Diagnostic Neuroimaging

## 2015-06-02 VITALS — BP 152/92 | HR 75 | Ht 64.5 in | Wt 236.0 lb

## 2015-06-02 DIAGNOSIS — F444 Conversion disorder with motor symptom or deficit: Secondary | ICD-10-CM

## 2015-06-02 DIAGNOSIS — F8081 Childhood onset fluency disorder: Secondary | ICD-10-CM | POA: Diagnosis not present

## 2015-06-02 NOTE — Progress Notes (Signed)
[facility]  PATIENT: Paula [name] DOB: 11/28/1961  REFERRING CLINICIAN: C Smith HISTORY FROM: patient and husband  REASON FOR VISIT: new consult    HISTORICAL  CHIEF COMPLAINT:  Chief Complaint  Patient presents with  . Tremors    rm 6, New patient, "speech problems"  . Dizziness    husband- Charles    HISTORY OF PRESENT ILLNESS:   54 year old right-handed female here for evaluation of tremors, speech problems, dizziness, since February 2015. Apparently patient had red about a recipe on the Internet of a weight loss cocktail. She mixed lemon, cayenne pepper, vinegar and drank this mixture. The next day she woke up, dizzy, head movements that she could not control, spasms in the neck and shoulders. Patient presented to the emergency room for evaluation. She also had shortness of breath and generalized weakness. CT scan of the head, lab testing, neurology consult, were unremarkable and concern for possible psychogenic tremor was raised. Patient had been under significant stress around this time.  Patient then followed up in outpatient neurology clinic, had routine EEG testing, which also was normal. 24-hour ambulatory EEG was performed which showed focal slowing over left temporal region without epileptiform discharges, or echographic seizures or subjective clinical events on patient diary. Patient then had MRI of the brain which also was normal.  Patient has tried propranolol, gabapentin, clonazepam without benefit. She was recommended to see a therapist and counselor that she has not been able to go yet. She has history of prior traumatic events which she did not go into detail with me today. Her husband also suffers from PTSD and he gets treatment through the [facility].  Patient saw another neurologist at [facility], who also concluded that psychogenic etiology was most likely.  Patient now presents to me (fourth neurologist opinion) for  evaluation.   REVIEW OF SYSTEMS: Full 14 system review of systems performed and notable only for weight gain fatigue blurred vision shortness of breath chest pain palpitation ringing in ears spinning sensation diarrhea cramps joint pain aching muscles feeling hot feeling cold anemia easy bruising memory loss confusion headache weakness blurred speech dizziness tremor insomnia sleepiness snoring restless legs depression anxiety not enough sleep disinterest in activities.  ALLERGIES: Allergies  Allergen Reactions  . Aspirin Other (See Comments)    Has history of ulcers   . Gabapentin     Wt gain, tremor  . Hydrocodone-Acetaminophen Nausea And Vomiting  . Imitrex [Sumatriptan]     Body hurts  . Moxifloxacin Itching  . Oxycodone Nausea And Vomiting    HOME MEDICATIONS: Outpatient Prescriptions Prior to Visit  Medication Sig Dispense Refill  . clonazePAM (KLONOPIN) 0.5 MG tablet Take 0.5 mg by mouth 3 (three) times daily as needed for anxiety.    [name] esomeprazole (NEXIUM) 40 MG capsule Take 1 capsule (40 mg total) by mouth daily. 90 capsule 2  . LORazepam (ATIVAN) 1 MG tablet Take 1 tablet (1 mg total) by mouth 3 (three) times daily as needed for anxiety (and tremor). 15 tablet 0  . sertraline (ZOLOFT) 50 MG tablet Take 50 mg by mouth daily.     No facility-administered medications prior to visit.    PAST MEDICAL HISTORY: Past Medical History  Diagnosis Date  . Anemia   . Anxiety   . Depression   . GERD (gastroesophageal reflux disease)   . Hyperlipidemia   . Hypertension   . Thyroid disease     nodule  . Ulcer   . Diverticulosis   .  Sleep apnea     on C-pap machine  . Arthritis   . Vertigo   . Hernia   . IBS (irritable bowel syndrome)   . Obesity   . Tremor   . Osteoarthritis   . Migraine   . Restless leg     PAST SURGICAL HISTORY: Past Surgical History  Procedure Laterality Date  . Cholecystectomy  2001  . Cesarean section    . Cervical fusion  2005  .  Inguinal hernia repair    . Partial hysterectomy      FAMILY HISTORY: Family History  Problem Relation Age of Onset  . Colon cancer Maternal Grandfather   . Hypertension Maternal Grandfather   . Cancer Maternal Grandfather     lung  . Hyperlipidemia Mother   . Hypertension Mother   . Hypertension Maternal Grandmother   . Hypertension Brother   . Hypertension Sister   . Cancer Maternal Aunt     lung    SOCIAL HISTORY:  History   Social History  . Marital Status: Married    Spouse Name: Juanda Crumble  . Number of Children: 2  . Years of Education: 11   Occupational History  . disabled    Social History Main Topics  . Smoking status: Former Smoker -- 20 years    Types: Cigarettes    Quit date: 07/24/2008  . Smokeless tobacco: Never Used     Comment: 1 pack per week  . Alcohol Use: No  . Drug Use: No  . Sexual Activity: Yes    Birth Control/ Protection: Surgical   Other Topics Concern  . Not on file   Social History Narrative   Married, lives at home      Caffeine use - coffee, tea once a week      Jehovah's Witness - no blood products     PHYSICAL EXAM   GENERAL EXAM/CONSTITUTIONAL: Vitals:  Filed Vitals:   06/02/15 1006  BP: 152/92  Pulse: 75  Height: 5' 4.5" (1.638 m)  Weight: 236 lb (107.049 kg)     Body mass index is 39.9 kg/(m^2).  Visual Acuity Screening   Right eye Left eye Both eyes  Without correction: 20/40 20/70   With correction:        Patient is in no distress; well developed, nourished and groomed; neck is supple; INTERMITTENT HEAD TREMOR; SLOW, HESITANT SPEECH  CARDIOVASCULAR:  Examination of carotid arteries is normal; no carotid bruits  Regular rate and rhythm, no murmurs  Examination of peripheral vascular system by observation and palpation is normal  EYES:  Ophthalmoscopic exam of optic discs and posterior segments is normal; no papilledema or hemorrhages  MUSCULOSKELETAL:  Gait, strength, tone, movements noted  in Neurologic exam below  NEUROLOGIC: MENTAL STATUS:  No flowsheet data found.  awake, alert, oriented to person, place and time  recent and remote memory intact  normal attention and concentration  language fluent, comprehension intact, naming intact,   fund of knowledge appropriate  CRANIAL NERVE:   2nd - no papilledema on fundoscopic exam  2nd, 3rd, 4th, 6th - pupils equal and reactive to light, visual fields full to confrontation, extraocular muscles intact, no nystagmus  5th - facial sensation symmetric  7th - facial strength symmetric  8th - hearing intact  9th - palate elevates symmetrically, uvula midline  11th - shoulder shrug symmetric  12th - tongue protrusion midline  MOTOR:   normal bulk and tone, full strength in the BUE, BLE  SENSORY:  normal and symmetric to light touch, pinprick, temperature, vibration   COORDINATION:   finger-nose-finger, fine finger movements normal  REFLEXES:   deep tendon reflexes present and symmetric  GAIT/STATION:   narrow based gait; able to walk on toes, heels and tandem; romberg is negative    DIAGNOSTIC DATA (LABS, IMAGING, TESTING) - I reviewed patient records, labs, notes, testing and imaging myself where available.  Lab Results  Component Value Date   WBC 8.1 05/22/2015   HGB 12.9 05/22/2015   HCT 40.4 05/22/2015   MCV 89.4 05/22/2015   PLT 393 05/22/2015      Component Value Date/Time   NA 139 05/22/2015 1109   K 3.7 05/22/2015 1109   CL 108 05/22/2015 1109   CO2 24 05/22/2015 1109   GLUCOSE 99 05/22/2015 1109   BUN 10 05/22/2015 1109   CREATININE 0.68 05/22/2015 1109   CALCIUM 9.0 05/22/2015 1109   PROT 7.0 05/22/2015 1109   ALBUMIN 4.2 05/22/2015 1109   AST 30 05/22/2015 1109   ALT 38 05/22/2015 1109   ALKPHOS 76 05/22/2015 1109   BILITOT 0.8 05/22/2015 1109   GFRNONAA >60 05/22/2015 1109   GFRAA >60 05/22/2015 1109   Lab Results  Component Value Date   CHOL 261* 01/08/2010    HDL 51.40 01/08/2010   LDLDIRECT 170.1 01/08/2010   TRIG 210.0* 01/08/2010   CHOLHDL 5 01/08/2010   Lab Results  Component Value Date   HGBA1C 5.5 11/28/2014   Lab Results  Component Value Date   VITAMINB12 255 11/28/2014   Lab Results  Component Value Date   TSH 2.18 11/28/2014    04/05/14 EEG - This 24-hour ambulatory EEG study is abnormal due to occasional focal slowing over the left temporal region.Iindicates focal cerebral dysfunction over the left temporal region suggestive of underlying structural or physiologic abnormality. There were no clear epileptiform discharges or electrographic seizures seen. Patient did not complete diary, push button events did not show electrographic correlate.  05/22/15 MRI brain [I reviewed images myself and agree with interpretation. -VRP]  1. Stable and normal for age noncontrast MRI appearance of the brain. 2. C3-C4 degenerative spinal stenosis Re identified, see cervical spine MRI 12/18/2014.  05/22/15 CT head [I reviewed images myself and agree with interpretation. -VRP]  - Negative CT head.  12/18/14 MRI lumbar spine [I reviewed images myself and agree with interpretation. -VRP]  - Mild for age lumbar spine degeneration, primarily intermittent facet arthropathy. No acute osseous abnormality, disc herniation, spinal stenosis, or neural impingement.  12/18/14 MRI cervical spine [I reviewed images myself and agree with interpretation. -VRP]  1. Chronic fusion from the C4 to the C7 level. 2. Adjacent segment disease at C3-C4 with chronic disc osteophyte complex, and increased spinal stenosis since 2013. Spinal cord mass effect with no cord signal abnormality. Mild C4 foraminal stenosis also increased.  3. Adjacent segment disease at C7-T1 with mildly increased facet degeneration and stable disc osteophyte complex.  05/25/14 MRI brain [I reviewed images myself and agree with interpretation. -VRP]  1. Negative MRI of the brain. 2. Postsurgical  changes of the cervical spine.  04/18/14 MRI brain [I reviewed images myself and agree with interpretation. -VRP]  - No acute intracranial process. Normal MRI of the brain with and without contrast for age, with particular attention to the left temporal lobe.  02/23/14 MRI brain [I reviewed images myself and agree with interpretation. -VRP]  1. Stable, normal noncontrast MRI appearance of the brain. 2. New but primarily mild paranasal  sinus inflammatory changes.      ASSESSMENT AND PLAN  54 y.o. year old female here with fluctuating, intermittent tremor and speech difficulties, dizziness, in the setting of significant psychosocial stress, prior traumatic experience, with 3 neurologic consultations diagnosing psychogenic tremor, and 4 normal MRI brain studies since 2015. Lab testing and EEG, MRI of cervical lumbar spine also unremarkable. In my opinion, review of prior testing and evaluations, I agree with the diagnosis of psychogenic tremor and speech disturbance. I reviewed findings, diagnosis and prognosis with the patient. I strongly encouraged her to explore treatment of ongoing stress as well as prior traumatic experience, through the help of a psychiatrist and psychologist. Patient and husband's questions were answered satisfactorily.  Dx: psychogenic tremor/speech disturbance  PLAN: - follow up with psychiatry and psychology  Return in about 6 months (around 12/03/2015).    Penni Bombard, MD 4/0/9811, 91:47 AM Certified in Neurology, Neurophysiology and Neuroimaging  [facility] [address], [place], [place] [address] [phone]

## 2015-06-04 ENCOUNTER — Encounter (HOSPITAL_COMMUNITY): Payer: Self-pay | Admitting: Emergency Medicine

## 2015-06-04 ENCOUNTER — Emergency Department (HOSPITAL_COMMUNITY)
Admission: EM | Admit: 2015-06-04 | Discharge: 2015-06-05 | Disposition: A | Discharge status: Home / Self Care | Payer: Medicare Other | Attending: Emergency Medicine | Admitting: Emergency Medicine

## 2015-06-04 DIAGNOSIS — R109 Unspecified abdominal pain: Secondary | ICD-10-CM | POA: Insufficient documentation

## 2015-06-04 DIAGNOSIS — Z862 Personal history of diseases of the blood and blood-forming organs and certain disorders involving the immune mechanism: Secondary | ICD-10-CM | POA: Insufficient documentation

## 2015-06-04 DIAGNOSIS — K219 Gastro-esophageal reflux disease without esophagitis: Secondary | ICD-10-CM | POA: Diagnosis not present

## 2015-06-04 DIAGNOSIS — Z9049 Acquired absence of other specified parts of digestive tract: Secondary | ICD-10-CM | POA: Diagnosis not present

## 2015-06-04 DIAGNOSIS — N201 Calculus of ureter: Secondary | ICD-10-CM | POA: Diagnosis not present

## 2015-06-04 DIAGNOSIS — Z9981 Dependence on supplemental oxygen: Secondary | ICD-10-CM | POA: Diagnosis not present

## 2015-06-04 DIAGNOSIS — R112 Nausea with vomiting, unspecified: Secondary | ICD-10-CM | POA: Diagnosis not present

## 2015-06-04 DIAGNOSIS — R197 Diarrhea, unspecified: Secondary | ICD-10-CM | POA: Diagnosis not present

## 2015-06-04 DIAGNOSIS — Z8639 Personal history of other endocrine, nutritional and metabolic disease: Secondary | ICD-10-CM | POA: Insufficient documentation

## 2015-06-04 DIAGNOSIS — E669 Obesity, unspecified: Secondary | ICD-10-CM | POA: Insufficient documentation

## 2015-06-04 DIAGNOSIS — Z87891 Personal history of nicotine dependence: Secondary | ICD-10-CM | POA: Diagnosis not present

## 2015-06-04 DIAGNOSIS — F419 Anxiety disorder, unspecified: Secondary | ICD-10-CM | POA: Diagnosis not present

## 2015-06-04 DIAGNOSIS — N281 Cyst of kidney, acquired: Secondary | ICD-10-CM | POA: Diagnosis not present

## 2015-06-04 DIAGNOSIS — Z3202 Encounter for pregnancy test, result negative: Secondary | ICD-10-CM | POA: Insufficient documentation

## 2015-06-04 DIAGNOSIS — E785 Hyperlipidemia, unspecified: Secondary | ICD-10-CM | POA: Insufficient documentation

## 2015-06-04 DIAGNOSIS — Z9889 Other specified postprocedural states: Secondary | ICD-10-CM | POA: Insufficient documentation

## 2015-06-04 DIAGNOSIS — F329 Major depressive disorder, single episode, unspecified: Secondary | ICD-10-CM | POA: Insufficient documentation

## 2015-06-04 DIAGNOSIS — M1990 Unspecified osteoarthritis, unspecified site: Secondary | ICD-10-CM | POA: Diagnosis not present

## 2015-06-04 DIAGNOSIS — Z79899 Other long term (current) drug therapy: Secondary | ICD-10-CM | POA: Insufficient documentation

## 2015-06-04 DIAGNOSIS — G4730 Sleep apnea, unspecified: Secondary | ICD-10-CM | POA: Insufficient documentation

## 2015-06-04 DIAGNOSIS — I10 Essential (primary) hypertension: Secondary | ICD-10-CM | POA: Diagnosis not present

## 2015-06-04 DIAGNOSIS — N1330 Unspecified hydronephrosis: Secondary | ICD-10-CM | POA: Diagnosis not present

## 2015-06-04 DIAGNOSIS — R1031 Right lower quadrant pain: Secondary | ICD-10-CM | POA: Diagnosis not present

## 2015-06-04 NOTE — ED Notes (Signed)
Pt c/o R flank pain onset yesterday radiating to RLQ, active vomiting in triage.

## 2015-06-05 ENCOUNTER — Encounter (HOSPITAL_COMMUNITY): Payer: Self-pay

## 2015-06-05 ENCOUNTER — Emergency Department (HOSPITAL_COMMUNITY): Payer: Medicare Other

## 2015-06-05 DIAGNOSIS — N201 Calculus of ureter: Secondary | ICD-10-CM | POA: Diagnosis not present

## 2015-06-05 DIAGNOSIS — N281 Cyst of kidney, acquired: Secondary | ICD-10-CM | POA: Diagnosis not present

## 2015-06-05 DIAGNOSIS — N1330 Unspecified hydronephrosis: Secondary | ICD-10-CM | POA: Diagnosis not present

## 2015-06-05 LAB — CBC WITH DIFFERENTIAL/PLATELET
Basophils Absolute: 0 10*3/uL (ref 0.0–0.1)
Basophils Relative: 0 % (ref 0–1)
Eosinophils Absolute: 0.2 10*3/uL (ref 0.0–0.7)
Eosinophils Relative: 2 % (ref 0–5)
HCT: 36.2 % (ref 36.0–46.0)
Hemoglobin: 12 g/dL (ref 12.0–15.0)
Lymphocytes Relative: 37 % (ref 12–46)
Lymphs Abs: 4 10*3/uL (ref 0.7–4.0)
MCH: 28.9 pg (ref 26.0–34.0)
MCHC: 33.1 g/dL (ref 30.0–36.0)
MCV: 87.2 fL (ref 78.0–100.0)
Monocytes Absolute: 1 10*3/uL (ref 0.1–1.0)
Monocytes Relative: 9 % (ref 3–12)
Neutro Abs: 5.6 10*3/uL (ref 1.7–7.7)
Neutrophils Relative %: 52 % (ref 43–77)
Platelets: 342 10*3/uL (ref 150–400)
RBC: 4.15 MIL/uL (ref 3.87–5.11)
RDW: 12.2 % (ref 11.5–15.5)
WBC: 10.9 10*3/uL — ABNORMAL HIGH (ref 4.0–10.5)

## 2015-06-05 LAB — URINE MICROSCOPIC-ADD ON

## 2015-06-05 LAB — COMPREHENSIVE METABOLIC PANEL
ALT: 33 U/L (ref 14–54)
AST: 32 U/L (ref 15–41)
Albumin: 4.5 g/dL (ref 3.5–5.0)
Alkaline Phosphatase: 78 U/L (ref 38–126)
Anion gap: 12 (ref 5–15)
BUN: 15 mg/dL (ref 6–20)
CO2: 24 mmol/L (ref 22–32)
Calcium: 9.6 mg/dL (ref 8.9–10.3)
Chloride: 102 mmol/L (ref 101–111)
Creatinine, Ser: 1.28 mg/dL — ABNORMAL HIGH (ref 0.44–1.00)
GFR calc Af Amer: 54 mL/min — ABNORMAL LOW (ref >60)
GFR calc non Af Amer: 46 mL/min — ABNORMAL LOW (ref >60)
Glucose, Bld: 111 mg/dL — ABNORMAL HIGH (ref 65–99)
Potassium: 3.9 mmol/L (ref 3.5–5.1)
Sodium: 138 mmol/L (ref 135–145)
Total Bilirubin: 0.8 mg/dL (ref 0.3–1.2)
Total Protein: 7.8 g/dL (ref 6.5–8.1)

## 2015-06-05 LAB — URINALYSIS, ROUTINE W REFLEX MICROSCOPIC
Bilirubin Urine: NEGATIVE
Glucose, UA: NEGATIVE mg/dL
Ketones, ur: NEGATIVE mg/dL
Leukocytes, UA: NEGATIVE
Nitrite: NEGATIVE
Protein, ur: NEGATIVE mg/dL
Specific Gravity, Urine: 1.014 (ref 1.005–1.030)
Urobilinogen, UA: 0.2 mg/dL (ref 0.0–1.0)
pH: 6.5 (ref 5.0–8.0)

## 2015-06-05 LAB — LIPASE, BLOOD: Lipase: 21 U/L — ABNORMAL LOW (ref 22–51)

## 2015-06-05 LAB — POC URINE PREG, ED: Preg Test, Ur: NEGATIVE

## 2015-06-05 MED ORDER — TAMSULOSIN HCL 0.4 MG PO CAPS: 0.4000 mg | ORAL_CAPSULE | Freq: Two times a day (BID) | ORAL | Status: DC

## 2015-06-05 MED ORDER — FENTANYL CITRATE (PF) 100 MCG/2ML IJ SOLN
50.0000 ug | Freq: Once | INTRAMUSCULAR | Status: AC
  Administered 2015-06-05: 50 ug via INTRAVENOUS
  Filled 2015-06-05: qty 2

## 2015-06-05 MED ORDER — OXYCODONE-ACETAMINOPHEN 5-325 MG PO TABS: 1.0000 | ORAL_TABLET | ORAL | Status: DC | PRN

## 2015-06-05 MED ORDER — HYDROMORPHONE HCL 1 MG/ML IJ SOLN: 1.0000 mg | Freq: Once | INTRAMUSCULAR | Status: DC

## 2015-06-05 MED ORDER — IOHEXOL 300 MG/ML  SOLN
25.0000 mL | Freq: Once | INTRAMUSCULAR | Status: AC | PRN
  Administered 2015-06-05: 25 mL via ORAL

## 2015-06-05 MED ORDER — IOHEXOL 300 MG/ML  SOLN
100.0000 mL | Freq: Once | INTRAMUSCULAR | Status: AC | PRN
  Administered 2015-06-05: 100 mL via INTRAVENOUS

## 2015-06-05 MED ORDER — ONDANSETRON 4 MG PO TBDP
4.0000 mg | ORAL_TABLET | Freq: Once | ORAL | Status: AC
  Administered 2015-06-05: 4 mg via ORAL
  Filled 2015-06-05: qty 1

## 2015-06-05 MED ORDER — ONDANSETRON HCL 4 MG/2ML IJ SOLN
4.0000 mg | Freq: Once | INTRAMUSCULAR | Status: AC
  Administered 2015-06-05: 4 mg via INTRAVENOUS
  Filled 2015-06-05: qty 2

## 2015-06-05 MED ORDER — OXYCODONE-ACETAMINOPHEN 5-325 MG PO TABS
2.0000 | ORAL_TABLET | Freq: Once | ORAL | Status: AC
  Administered 2015-06-05: 2 via ORAL
  Filled 2015-06-05: qty 2

## 2015-06-05 MED ORDER — ONDANSETRON 4 MG PO TBDP: ORAL_TABLET | ORAL | Status: DC

## 2015-06-05 MED ORDER — KETOROLAC TROMETHAMINE 30 MG/ML IJ SOLN
30.0000 mg | Freq: Once | INTRAMUSCULAR | Status: AC
  Administered 2015-06-05: 30 mg via INTRAVENOUS
  Filled 2015-06-05: qty 1

## 2015-06-05 MED ORDER — HYDROMORPHONE HCL 1 MG/ML IJ SOLN
1.0000 mg | Freq: Once | INTRAMUSCULAR | Status: AC
  Administered 2015-06-05: 1 mg via INTRAVENOUS
  Filled 2015-06-05: qty 1

## 2015-06-05 MED ORDER — MORPHINE SULFATE 4 MG/ML IJ SOLN
6.0000 mg | Freq: Once | INTRAMUSCULAR | Status: AC
  Administered 2015-06-05: 6 mg via INTRAVENOUS
  Filled 2015-06-05: qty 2

## 2015-06-05 NOTE — ED Provider Notes (Signed)
Patient care signed out to me by Josh geiple, PA-C. Patient found to have large proximal right ureteral stone. Plan is for reevaluation after patient wakes up. If pain is controlled, patient may be discharged home with pain medicines and out patient referral to urology. 8:42 AM-patient states she feels much better than she did before. Says she is ready to go home and will follow-up with urology for definitive care. DC with referral to urology, pain medicines, nausea medicines and tamsulosin  Filed Vitals:   06/05/15 0630 06/05/15 0700 06/05/15 0730 06/05/15 0800  BP: 130/64 111/72 142/86 124/76  Pulse: 54 55 58 53  Temp:      TempSrc:      Resp:   16   Height:      Weight:      SpO2: 95% 99% 93% 98%   Meds given in ED:  Medications  oxyCODONE-acetaminophen (PERCOCET/ROXICET) 5-325 MG per tablet 2 tablet (not administered)  ondansetron (ZOFRAN-ODT) disintegrating tablet 4 mg (not administered)  fentaNYL (SUBLIMAZE) injection 50 mcg (50 mcg Intravenous Given 06/05/15 0042)  ondansetron (ZOFRAN) injection 4 mg (4 mg Intravenous Given 06/05/15 0042)  iohexol (OMNIPAQUE) 300 MG/ML solution 25 mL (25 mLs Oral Contrast Given 06/05/15 0133)  morphine 4 MG/ML injection 6 mg (6 mg Intravenous Given 06/05/15 0144)  iohexol (OMNIPAQUE) 300 MG/ML solution 100 mL (100 mLs Intravenous Contrast Given 06/05/15 0240)  HYDROmorphone (DILAUDID) injection 1 mg (1 mg Intravenous Given 06/05/15 0324)  ketorolac (TORADOL) 30 MG/ML injection 30 mg (30 mg Intravenous Given 06/05/15 0324)    New Prescriptions   ONDANSETRON (ZOFRAN ODT) 4 MG DISINTEGRATING TABLET    4mg  ODT q4 hours prn nausea/vomit   OXYCODONE-ACETAMINOPHEN (PERCOCET) 5-325 MG PER TABLET    Take 1-2 tablets by mouth every 4 (four) hours as needed.   TAMSULOSIN (FLOMAX) 0.4 MG CAPS CAPSULE    Take 1 capsule (0.4 mg total) by mouth 2 (two) times daily.   Patient stable, in good condition and is appropriate for discharge.   Comer Locket, PA-C 06/05/15  0102  Virgel Manifold, MD 06/05/15 [phone]

## 2015-06-05 NOTE — ED Provider Notes (Signed)
CSN: [id number]     Arrival date & time 06/04/15  2338 History   First MD Initiated Contact with Patient 06/04/15 2359     Chief Complaint  Patient presents with  . Flank Pain   HPI   54 year old female with extensive problem list presents today with right flank and abdominal pain. Patient reports the pain slowly developed yesterday in her right upper quadrant abdomen with radiation around to her right flank and back. She reports associated nausea/ vomiting, diarrhea. She reports 4 episodes of nonbloody diarrhea and one episode of vomiting yesterday. She denies trauma to the abdomen, exposure to abnormal food or drink, no close sick contacts.  Patient denies fever, chills, headache, neck stiffness, chest pain, alcohol abuse, changes in urine color clarity or characteristics.  Patient has a significant past medical history of cholecystectomy, hernia repair, and a partial hysterectomy.  Past Medical History  Diagnosis Date  . Anemia   . Anxiety   . Depression   . GERD (gastroesophageal reflux disease)   . Hyperlipidemia   . Hypertension   . Thyroid disease     nodule  . Ulcer   . Diverticulosis   . Sleep apnea     on C-pap machine  . Arthritis   . Vertigo   . Hernia   . IBS (irritable bowel syndrome)   . Obesity   . Tremor   . Osteoarthritis   . Migraine   . Restless leg    Past Surgical History  Procedure Laterality Date  . Cholecystectomy  2001  . Cesarean section    . Cervical fusion  2005  . Inguinal hernia repair    . Partial hysterectomy     Family History  Problem Relation Age of Onset  . Colon cancer Maternal Grandfather   . Hypertension Maternal Grandfather   . Cancer Maternal Grandfather     lung  . Hyperlipidemia Mother   . Hypertension Mother   . Hypertension Maternal Grandmother   . Hypertension Brother   . Hypertension Sister   . Cancer Maternal Aunt     lung   History  Substance Use Topics  . Smoking status: Former Smoker -- 20 years    Types:  Cigarettes    Quit date: 07/24/2008  . Smokeless tobacco: Never Used     Comment: 1 pack per week  . Alcohol Use: No   OB History    Gravida Para Term Preterm AB TAB SAB Ectopic Multiple Living   3 2 2  1 1    2      Review of Systems  All other systems reviewed and are negative.    Allergies  Aspirin; Gabapentin; Hydrocodone-acetaminophen; Imitrex; Moxifloxacin; and Oxycodone  Home Medications   Prior to Admission medications   Medication Sig Start Date End Date Taking? Authorizing Provider  acetaminophen (TYLENOL) 500 MG tablet Take 1,000 mg by mouth every 6 (six) hours as needed for moderate pain.   Yes Historical Provider, MD  clonazePAM (KLONOPIN) 0.5 MG tablet Take 0.5 mg by mouth 3 (three) times daily as needed for anxiety.   Yes Historical Provider, MD  esomeprazole (NEXIUM) 40 MG capsule Take 1 capsule (40 mg total) by mouth daily. 07/29/14  Yes Aleksei Plotnikov V, MD  sertraline (ZOLOFT) 50 MG tablet Take 50 mg by mouth daily.   Yes Historical Provider, MD  LORazepam (ATIVAN) 1 MG tablet Take 1 tablet (1 mg total) by mouth 3 (three) times daily as needed for anxiety (and tremor). Patient not  taking: Reported on 06/04/2015 05/22/15   Ernestina Patches, MD   BP 158/85 mmHg  Pulse 72  Temp(Src) 98.2 F (36.8 C) (Oral)  Resp 20  Ht 5\' 4"  (1.626 m)  Wt 236 lb (107.049 kg)  BMI 40.49 kg/m2  SpO2 100%    Physical Exam  Constitutional: She is oriented to person, place, and time. She appears well-developed and well-nourished.  Patient appears anxious, anxiety easily reduced with distraction  HENT:  Head: Normocephalic and atraumatic.  Eyes: Conjunctivae are normal. Pupils are equal, round, and reactive to light. Right eye exhibits no discharge. Left eye exhibits no discharge. No scleral icterus.  Neck: Normal range of motion. No JVD present. No tracheal deviation present.  Cardiovascular: Normal rate, regular rhythm, normal heart sounds and intact distal pulses.  Exam  reveals no gallop and no friction rub.   No murmur heard. Pulmonary/Chest: Effort normal and breath sounds normal. No stridor. No respiratory distress. She has no wheezes. She has no rales. She exhibits no tenderness.  No lower extremity swelling or edema  Abdominal: Soft. Bowel sounds are normal. She exhibits no distension and no mass. There is tenderness. There is no rebound and no guarding.  Tenderness to palpation of right upper quadrant and right lower quadrant right flank   Musculoskeletal: Normal range of motion. She exhibits no edema.  Tenderness palpation of right superior gluteus  Neurological: She is alert and oriented to person, place, and time. Coordination normal.  Psychiatric: She has a normal mood and affect. Her behavior is normal. Judgment and thought content normal.  Nursing note and vitals reviewed.   ED Course  Procedures (including critical care time) Labs Review Labs Reviewed  CBC WITH DIFFERENTIAL/PLATELET - Abnormal; Notable for the following:    WBC 10.9 (*)    All other components within normal limits  COMPREHENSIVE METABOLIC PANEL  LIPASE, BLOOD  URINALYSIS, ROUTINE W REFLEX MICROSCOPIC (NOT AT [facility])  POC URINE PREG, ED    Imaging Review No results found.   EKG Interpretation None      MDM   Final diagnoses:  Abdominal pain, unspecified abdominal location    Labs: CBC CMP, lipase, urinalysis- pending  Imaging: CT abdomen and pelvis with contrast pending  Consults:  Therapeutics: fentanyl   Assessment:   Plan: Pt presents with abdominal pain along right upper and right lower right flank and gluteus.. She has complaints of vomiting and diarrhea. Patient appeared anxious but was easily distracted with conversation. Vital signs remained stable. Pts care signed out to [facility]: Lambert Rhonda W PA-C pending CT results and labs. Suspect gastroenteritis today; DX kidney stones, diverticulitis, appendicitis. patient was treated for pain, nausea, normal saline  started.       Okey Regal, PA-C 06/05/15 0112  Veatrice Kells, MD 06/05/15 [phone]

## 2015-06-05 NOTE — Discharge Instructions (Signed)
You were evaluated in the ED and found to have a kidney stone. It is important to follow-up with urology for definitive care of your kidney stone. Please take your pain medications as directed. You may use your Zofran for any nausea may experience associated with your kidney stone or pain medicine. Return to ED for new or worsening symptoms.

## 2015-06-05 NOTE — ED Provider Notes (Signed)
1:00 AM Handoff from Hedges PA-C at shift change, patient with flank pain starting yesterday. Symptoms were severe. She had associated nausea, vomiting, and diarrhea. Pain radiates to right groin. No history of kidney stones. No fever, hematuria or urinary symptoms.  Patient pending CT scan.  3:27 AM Patient continues to be in pain after additional morphine given. Patient updated on CT results which were reviewed by myself. Patient has a large proximal right ureteral stone. Discussed with Dr. Randal Buba. Will give toradol, dilaudid, reassess. If pain not controlled, will need to call urology.   4:30 AM Pain better controlled. Patient is drowsy from pain medication. Will give time to metabolize and ensure pain remains controlled.   BP 140/77 mmHg  Pulse 58  Temp(Src) 98.2 F (36.8 C) (Oral)  Resp 18  Ht 5\' 4"  (1.626 m)  Wt 236 lb (107.049 kg)  BMI 40.49 kg/m2  SpO2 91%  5:28 AM Exam unchanged. Pt is not yet comfortable for discharge. She is still very sleepy. Will give more time to decide on disposition.   5:53 AM Handoff to [employer].   Plan: recheck, if awake and comfortable with d/c --> can go home. If pain becomes intolerable, call urology for intervention.   BP 122/65 mmHg  Pulse 58  Temp(Src) 98.2 F (36.8 C) (Oral)  Resp 18  Ht 5\' 4"  (1.626 m)  Wt 236 lb (107.049 kg)  BMI 40.49 kg/m2  SpO2 97%   Carlisle Cater, PA-C 06/05/15 0553  April Palumbo, MD 06/05/15 [phone]

## 2015-06-06 ENCOUNTER — Encounter (HOSPITAL_BASED_OUTPATIENT_CLINIC_OR_DEPARTMENT_OTHER): Payer: Self-pay | Admitting: *Deleted

## 2015-06-06 ENCOUNTER — Other Ambulatory Visit: Payer: Self-pay | Admitting: Urology

## 2015-06-06 DIAGNOSIS — N201 Calculus of ureter: Secondary | ICD-10-CM | POA: Diagnosis not present

## 2015-06-06 NOTE — Anesthesia Preprocedure Evaluation (Addendum)
Anesthesia Evaluation  Patient identified by MRN, date of birth, ID band Patient awake    Reviewed: Allergy & Precautions, NPO status , Patient's Chart, lab work & pertinent test results  History of Anesthesia Complications Negative for: history of anesthetic complications  Airway Mallampati: IV  TM Distance: <3 FB Neck ROM: Full    Dental no notable dental hx. (+) Teeth Intact, Dental Advisory Given   Pulmonary sleep apnea and Continuous Positive Airway Pressure Ventilation , former smoker,    Pulmonary exam normal       Cardiovascular - Past MI, - Cardiac Stents and - CABG Normal cardiovascular exam- Valvular Problems/Murmurs (per cardiology no evidence of valvular problems)Rhythm:Regular Rate:Normal  Negative stress test 10/2014, cardiologist could not identify an etiology to shortness of breath, likely deconditioning   Neuro/Psych  Headaches, PSYCHIATRIC DISORDERS Anxiety Depression Psychogenic conversion disorder Neuromuscular disease (fibromyalgia, psychogenic head tremor that has been evaluated by 4 neurologists in last year, 4 negative MRIs in last year) negative neurological ROS  negative psych ROS   GI/Hepatic Neg liver ROS, hiatal hernia, GERD-  Medicated and Controlled,No current symptoms of reflux   Endo/Other  negative endocrine ROS  Renal/GU Renal diseaseRenal stones  negative genitourinary   Musculoskeletal negative musculoskeletal ROS (+) Fibromyalgia -  Abdominal   Peds negative pediatric ROS (+)  Hematology negative hematology ROS (+) REFUSES BLOOD PRODUCTS, JEHOVAH'S WITNESS  Anesthesia Other Findings   Reproductive/Obstetrics negative OB ROS                         Anesthesia Physical Anesthesia Plan  ASA: III  Anesthesia Plan: General   Post-op Pain Management:    Induction: Intravenous  Airway Management Planned: LMA  Additional Equipment:   Intra-op Plan:    Post-operative Plan: Extubation in OR  Informed Consent: I have reviewed the patients History and Physical, chart, labs and discussed the procedure including the risks, benefits and alternatives for the proposed anesthesia with the patient or authorized representative who has indicated his/her understanding and acceptance.     Plan Discussed with: CRNA  Anesthesia Plan Comments: (- Will use proseal LMA given history of hiatal hernia - no history of anesthesia related issues with multiple previous surgeries - currently in pain in preop area, fentanyl and zofran given - NPO appropriate, denies reflux symptoms currently, no meds taken today, typically takes clonazepam TID for anxiety - uses CPAP for OSA all the time - no active cardiac symptoms, negative stress recently, negative cardiac eval recently )       Anesthesia Quick Evaluation

## 2015-06-06 NOTE — Progress Notes (Signed)
NPO AFTER MN.  ARRIVE AT 1100.  CURRENT LAB RESULTS AND EKG IN CHART AND EPIC. WILL TAKE FLOMAX AND NEXIUM AM DOS W/ SIPS OF WATER AND IF NEEDED TAKE PRN MEDS.

## 2015-06-07 ENCOUNTER — Encounter (HOSPITAL_BASED_OUTPATIENT_CLINIC_OR_DEPARTMENT_OTHER)
Admission: RE | Disposition: A | Discharge status: Home / Self Care | Payer: Self-pay | Source: Ambulatory Visit | Attending: Urology

## 2015-06-07 ENCOUNTER — Ambulatory Visit (HOSPITAL_BASED_OUTPATIENT_CLINIC_OR_DEPARTMENT_OTHER): Payer: Medicare Other | Admitting: Anesthesiology

## 2015-06-07 ENCOUNTER — Encounter (HOSPITAL_BASED_OUTPATIENT_CLINIC_OR_DEPARTMENT_OTHER): Payer: Self-pay | Admitting: Anesthesiology

## 2015-06-07 ENCOUNTER — Ambulatory Visit (HOSPITAL_BASED_OUTPATIENT_CLINIC_OR_DEPARTMENT_OTHER)
Admission: RE | Admit: 2015-06-07 | Discharge: 2015-06-07 | Disposition: A | Discharge status: Home / Self Care | Payer: Medicare Other | Source: Ambulatory Visit | Attending: Urology | Admitting: Urology

## 2015-06-07 DIAGNOSIS — Z955 Presence of coronary angioplasty implant and graft: Secondary | ICD-10-CM | POA: Insufficient documentation

## 2015-06-07 DIAGNOSIS — N201 Calculus of ureter: Secondary | ICD-10-CM | POA: Diagnosis present

## 2015-06-07 DIAGNOSIS — I252 Old myocardial infarction: Secondary | ICD-10-CM | POA: Diagnosis not present

## 2015-06-07 DIAGNOSIS — Z951 Presence of aortocoronary bypass graft: Secondary | ICD-10-CM | POA: Diagnosis not present

## 2015-06-07 DIAGNOSIS — M1990 Unspecified osteoarthritis, unspecified site: Secondary | ICD-10-CM | POA: Diagnosis not present

## 2015-06-07 DIAGNOSIS — F329 Major depressive disorder, single episode, unspecified: Secondary | ICD-10-CM | POA: Insufficient documentation

## 2015-06-07 DIAGNOSIS — Z79891 Long term (current) use of opiate analgesic: Secondary | ICD-10-CM | POA: Diagnosis not present

## 2015-06-07 DIAGNOSIS — F419 Anxiety disorder, unspecified: Secondary | ICD-10-CM | POA: Diagnosis not present

## 2015-06-07 DIAGNOSIS — Z79899 Other long term (current) drug therapy: Secondary | ICD-10-CM | POA: Diagnosis not present

## 2015-06-07 DIAGNOSIS — G4730 Sleep apnea, unspecified: Secondary | ICD-10-CM | POA: Insufficient documentation

## 2015-06-07 DIAGNOSIS — Z87891 Personal history of nicotine dependence: Secondary | ICD-10-CM | POA: Diagnosis not present

## 2015-06-07 DIAGNOSIS — K279 Peptic ulcer, site unspecified, unspecified as acute or chronic, without hemorrhage or perforation: Secondary | ICD-10-CM | POA: Diagnosis not present

## 2015-06-07 DIAGNOSIS — K219 Gastro-esophageal reflux disease without esophagitis: Secondary | ICD-10-CM | POA: Insufficient documentation

## 2015-06-07 DIAGNOSIS — Z9989 Dependence on other enabling machines and devices: Secondary | ICD-10-CM | POA: Insufficient documentation

## 2015-06-07 DIAGNOSIS — M797 Fibromyalgia: Secondary | ICD-10-CM | POA: Insufficient documentation

## 2015-06-07 DIAGNOSIS — N132 Hydronephrosis with renal and ureteral calculous obstruction: Secondary | ICD-10-CM | POA: Diagnosis not present

## 2015-06-07 DIAGNOSIS — Z981 Arthrodesis status: Secondary | ICD-10-CM | POA: Insufficient documentation

## 2015-06-07 DIAGNOSIS — N200 Calculus of kidney: Secondary | ICD-10-CM

## 2015-06-07 HISTORY — DX: Dizziness and giddiness: R42

## 2015-06-07 HISTORY — PX: CYSTOSCOPY WITH URETEROSCOPY AND STENT PLACEMENT: SHX6377

## 2015-06-07 HISTORY — DX: Fibromyalgia: M79.7

## 2015-06-07 HISTORY — DX: Restless legs syndrome: G25.81

## 2015-06-07 HISTORY — DX: Obstructive sleep apnea (adult) (pediatric): G47.33

## 2015-06-07 HISTORY — DX: Procedure and treatment not carried out because of patient's decision for reasons of belief and group pressure: Z53.1

## 2015-06-07 HISTORY — DX: Conversion disorder with motor symptom or deficit: F44.4

## 2015-06-07 HISTORY — DX: Dependence on other enabling machines and devices: Z99.89

## 2015-06-07 HISTORY — PX: HOLMIUM LASER APPLICATION: SHX5852

## 2015-06-07 HISTORY — DX: Personal history of other diseases of the digestive system: Z87.19

## 2015-06-07 HISTORY — DX: Reserved for inherently not codable concepts without codable children: IMO0001

## 2015-06-07 HISTORY — DX: Personal history of peptic ulcer disease: Z87.11

## 2015-06-07 HISTORY — DX: Personal history of other endocrine, nutritional and metabolic disease: Z86.39

## 2015-06-07 HISTORY — PX: CYSTOSCOPY W/ RETROGRADES: SHX1426

## 2015-06-07 SURGERY — CYSTOURETEROSCOPY, WITH STENT INSERTION
Anesthesia: General | Site: Ureter | Laterality: Right

## 2015-06-07 MED ORDER — FENTANYL CITRATE (PF) 100 MCG/2ML IJ SOLN
INTRAMUSCULAR | Status: DC | PRN
  Administered 2015-06-07 (×2): 50 ug via INTRAVENOUS

## 2015-06-07 MED ORDER — FENTANYL CITRATE (PF) 100 MCG/2ML IJ SOLN
INTRAMUSCULAR | Status: AC
  Filled 2015-06-07: qty 2

## 2015-06-07 MED ORDER — BELLADONNA ALKALOIDS-OPIUM 16.2-60 MG RE SUPP
RECTAL | Status: AC
  Filled 2015-06-07: qty 1

## 2015-06-07 MED ORDER — CEFAZOLIN SODIUM-DEXTROSE 2-3 GM-% IV SOLR
2.0000 g | INTRAVENOUS | Status: AC
  Administered 2015-06-07: 2 g via INTRAVENOUS
  Filled 2015-06-07: qty 50

## 2015-06-07 MED ORDER — ONDANSETRON HCL 4 MG/2ML IJ SOLN
INTRAMUSCULAR | Status: DC | PRN
  Administered 2015-06-07: 4 mg via INTRAVENOUS

## 2015-06-07 MED ORDER — ONDANSETRON HCL 4 MG/2ML IJ SOLN
4.0000 mg | Freq: Once | INTRAMUSCULAR | Status: AC
  Administered 2015-06-07: 4 mg via INTRAVENOUS
  Filled 2015-06-07: qty 2

## 2015-06-07 MED ORDER — FENTANYL CITRATE (PF) 100 MCG/2ML IJ SOLN
25.0000 ug | INTRAMUSCULAR | Status: DC | PRN
  Filled 2015-06-07: qty 1

## 2015-06-07 MED ORDER — ONDANSETRON HCL 4 MG/2ML IJ SOLN
4.0000 mg | Freq: Once | INTRAMUSCULAR | Status: DC
  Filled 2015-06-07: qty 2

## 2015-06-07 MED ORDER — MIDAZOLAM HCL 2 MG/2ML IJ SOLN
INTRAMUSCULAR | Status: AC
  Filled 2015-06-07: qty 2

## 2015-06-07 MED ORDER — DEXAMETHASONE SODIUM PHOSPHATE 4 MG/ML IJ SOLN
INTRAMUSCULAR | Status: DC | PRN
  Administered 2015-06-07: 10 mg via INTRAVENOUS

## 2015-06-07 MED ORDER — FENTANYL CITRATE (PF) 100 MCG/2ML IJ SOLN
25.0000 ug | Freq: Once | INTRAMUSCULAR | Status: AC
  Administered 2015-06-07: 25 ug via INTRAVENOUS
  Filled 2015-06-07: qty 0.5

## 2015-06-07 MED ORDER — LIDOCAINE HCL (CARDIAC) 20 MG/ML IV SOLN
INTRAVENOUS | Status: DC | PRN
  Administered 2015-06-07: 80 mg via INTRAVENOUS

## 2015-06-07 MED ORDER — FENTANYL CITRATE (PF) 100 MCG/2ML IJ SOLN
25.0000 ug | Freq: Once | INTRAMUSCULAR | Status: AC
Start: 2015-06-07 — End: 2015-06-07
  Administered 2015-06-07: 25 ug via INTRAVENOUS
  Filled 2015-06-07: qty 0.5

## 2015-06-07 MED ORDER — ACETAMINOPHEN 10 MG/ML IV SOLN
INTRAVENOUS | Status: DC | PRN
  Administered 2015-06-07: 1000 mg via INTRAVENOUS

## 2015-06-07 MED ORDER — MIDAZOLAM HCL 5 MG/5ML IJ SOLN
INTRAMUSCULAR | Status: DC | PRN
  Administered 2015-06-07: 2 mg via INTRAVENOUS

## 2015-06-07 MED ORDER — LACTATED RINGERS IV SOLN
INTRAVENOUS | Status: DC
  Administered 2015-06-07 (×2): via INTRAVENOUS
  Filled 2015-06-07: qty 1000

## 2015-06-07 MED ORDER — PHENAZOPYRIDINE HCL 200 MG PO TABS: 200.0000 mg | ORAL_TABLET | Freq: Three times a day (TID) | ORAL | Status: DC | PRN

## 2015-06-07 MED ORDER — KETOROLAC TROMETHAMINE 30 MG/ML IJ SOLN
INTRAMUSCULAR | Status: DC | PRN
  Administered 2015-06-07: 30 mg via INTRAVENOUS

## 2015-06-07 MED ORDER — TRAMADOL HCL 50 MG PO TABS: 50.0000 mg | ORAL_TABLET | Freq: Four times a day (QID) | ORAL | Status: DC | PRN

## 2015-06-07 MED ORDER — CEFAZOLIN SODIUM 1-5 GM-% IV SOLN
INTRAVENOUS | Status: AC
  Filled 2015-06-07: qty 100

## 2015-06-07 MED ORDER — ONDANSETRON HCL 4 MG/2ML IJ SOLN
INTRAMUSCULAR | Status: AC
  Filled 2015-06-07: qty 2

## 2015-06-07 MED ORDER — FENTANYL CITRATE (PF) 100 MCG/2ML IJ SOLN
INTRAMUSCULAR | Status: AC
Start: 2015-06-07 — End: 2015-06-07
  Filled 2015-06-07: qty 4

## 2015-06-07 MED ORDER — TROSPIUM CHLORIDE ER 60 MG PO CP24: 60.0000 mg | ORAL_CAPSULE | Freq: Every day | ORAL | Status: DC

## 2015-06-07 MED ORDER — PROMETHAZINE HCL 25 MG/ML IJ SOLN
6.2500 mg | INTRAMUSCULAR | Status: DC | PRN
  Filled 2015-06-07: qty 1

## 2015-06-07 MED ORDER — SODIUM CHLORIDE 0.9 % IR SOLN
Status: DC | PRN
  Administered 2015-06-07: 4000 mL via INTRAVESICAL

## 2015-06-07 MED ORDER — IOHEXOL 350 MG/ML SOLN
INTRAVENOUS | Status: DC | PRN
  Administered 2015-06-07: 5 mL via INTRAVENOUS

## 2015-06-07 MED ORDER — PROPOFOL 10 MG/ML IV BOLUS
INTRAVENOUS | Status: DC | PRN
  Administered 2015-06-07: 150 mg via INTRAVENOUS
  Administered 2015-06-07: 200 mg via INTRAVENOUS
  Administered 2015-06-07: 30 mg via INTRAVENOUS

## 2015-06-07 SURGICAL SUPPLY — 40 items
ADAPTER CATH URET PLST 4-6FR (CATHETERS) IMPLANT
ADPR CATH URET STRL DISP 4-6FR (CATHETERS)
BAG DRAIN URO-CYSTO SKYTR STRL (DRAIN) ×5 IMPLANT
BAG DRN UROCATH (DRAIN) ×3
BASKET LASER NITINOL 1.9FR (BASKET) IMPLANT
BASKET STNLS GEMINI 4WIRE 3FR (BASKET) IMPLANT
BASKET STONE 1.7 NGAGE (UROLOGICAL SUPPLIES) ×4 IMPLANT
BASKET ZERO TIP NITINOL 2.4FR (BASKET) IMPLANT
BSKT STON RTRVL 120 1.9FR (BASKET)
BSKT STON RTRVL GEM 120X11 3FR (BASKET)
BSKT STON RTRVL ZERO TP 2.4FR (BASKET)
CANISTER SUCT LVC 12 LTR MEDI- (MISCELLANEOUS) ×5 IMPLANT
CATH INTERMIT  6FR 70CM (CATHETERS) IMPLANT
CATH URET 5FR 28IN CONE TIP (BALLOONS)
CATH URET 5FR 28IN OPEN ENDED (CATHETERS) ×5 IMPLANT
CATH URET 5FR 70CM CONE TIP (BALLOONS) IMPLANT
CATH URET DUAL LUMEN 6-10FR 50 (CATHETERS) IMPLANT
CLOTH BEACON ORANGE TIMEOUT ST (SAFETY) ×5 IMPLANT
DRSG TEGADERM 2-3/8X2-3/4 SM (GAUZE/BANDAGES/DRESSINGS) IMPLANT
FIBER LASER FLEXIVA 365 (UROLOGICAL SUPPLIES) ×2 IMPLANT
FIBER LASER TRAC TIP (UROLOGICAL SUPPLIES) IMPLANT
GLOVE BIO SURGEON STRL SZ7.5 (GLOVE) ×5 IMPLANT
GOWN STRL REUS W/ TWL XL LVL3 (GOWN DISPOSABLE) ×3 IMPLANT
GOWN STRL REUS W/TWL XL LVL3 (GOWN DISPOSABLE) ×5
GUIDEWIRE 0.038 PTFE COATED (WIRE) IMPLANT
GUIDEWIRE ANG ZIPWIRE 038X150 (WIRE) IMPLANT
GUIDEWIRE STR DUAL SENSOR (WIRE) ×5 IMPLANT
IV NS 1000ML (IV SOLUTION) ×5
IV NS 1000ML BAXH (IV SOLUTION) IMPLANT
IV NS IRRIG 3000ML ARTHROMATIC (IV SOLUTION) ×10 IMPLANT
KIT BALLIN UROMAX 15FX10 (LABEL) IMPLANT
KIT BALLN UROMAX 15FX4 (MISCELLANEOUS) IMPLANT
KIT BALLN UROMAX 26 75X4 (MISCELLANEOUS)
MANIFOLD NEPTUNE II (INSTRUMENTS) IMPLANT
NS IRRIG 500ML POUR BTL (IV SOLUTION) IMPLANT
PACK CYSTO (CUSTOM PROCEDURE TRAY) ×5 IMPLANT
SET HIGH PRES BAL DIL (LABEL)
SHEATH ACCESS URETERAL 38CM (SHEATH) IMPLANT
STENT URET 6FRX24 CONTOUR (STENTS) ×2 IMPLANT
TUBE FEEDING 8FR 16IN STR KANG (MISCELLANEOUS) IMPLANT

## 2015-06-07 NOTE — Anesthesia Procedure Notes (Signed)
Procedure Name: LMA Insertion Date/Time: 06/07/2015 12:08 PM Performed by: Wanita Chamberlain Pre-anesthesia Checklist: Patient identified, Timeout performed, Emergency Drugs available, Suction available and Patient being monitored Patient Re-evaluated:Patient Re-evaluated prior to inductionOxygen Delivery Method: Circle system utilized Preoxygenation: Pre-oxygenation with 100% oxygen Intubation Type: IV induction Ventilation: Mask ventilation without difficulty LMA: LMA with gastric port inserted LMA Size: 4.0 Number of attempts: 1 Airway Equipment and Method: Bite block Placement Confirmation: positive ETCO2 and breath sounds checked- equal and bilateral Tube secured with: Tape Dental Injury: Teeth and Oropharynx as per pre-operative assessment  Future Recommendations: Recommend- induction with short-acting agent, and alternative techniques readily available

## 2015-06-07 NOTE — Discharge Instructions (Signed)
DISCHARGE INSTRUCTIONS FOR KIDNEY STONE/URETERAL STENT   MEDICATIONS:  1.  Resume all your other meds from home - except do not take any extra narcotic pain meds that you may have at home.  2. Trospium is to prevent bladder spasms and help reduce urinary frequency. 3. Pyridium is to help with the burning/stinging when you urinate. 4. Tramadol is for moderate/severe pain, otherwise taking upto 1000mg  every 6 hours of plainTylenol will help treat your pain.  Do not take both at the same time.   ACTIVITY:  1. No strenuous activity x 1week  2. No driving while on narcotic pain medications  3. Drink plenty of water  4. Continue to walk at home - you can still get blood clots when you are at home, so keep active, but don't over do it.  5. May return to work/school tomorrow or when you feel ready   BATHING:  1. You can shower and we recommend daily showers     SIGNS/SYMPTOMS TO CALL:  Please call us if you have a fever greater than 101.5, uncontrolled nausea/vomiting, uncontrolled pain, dizziness, unable to urinate, bloody urine, chest pain, shortness of breath, leg swelling, leg pain, redness around wound, drainage from wound, or any other concerns or questions.   You can reach [place] at [phone].   FOLLOW-UP:  You have an appointment for stent removal in 1-2 week.     Post Anesthesia Home Care Instructions  Activity: Get plenty of rest for the remainder of the day. A responsible adult should stay with you for 24 hours following the procedure.  For the next 24 hours, DO NOT: -Drive a car -Paediatric nurse -Drink alcoholic beverages -Take any medication unless instructed by your physician -Make any legal decisions or sign important papers.  Meals: Start with liquid foods such as gelatin or soup. Progress to regular foods as tolerated. Avoid greasy, spicy, heavy foods. If nausea and/or vomiting occur, drink only clear liquids until the nausea and/or vomiting subsides. Call your  physician if vomiting continues.  Special Instructions/Symptoms: Your throat may feel dry or sore from the anesthesia or the breathing tube placed in your throat during surgery. If this causes discomfort, gargle with warm salt water. The discomfort should disappear within 24 hours.  If you had a scopolamine patch placed behind your ear for the management of post- operative nausea and/or vomiting:  1. The medication in the patch is effective for 72 hours, after which it should be removed.  Wrap patch in a tissue and discard in the trash. Wash hands thoroughly with soap and water. 2. You may remove the patch earlier than 72 hours if you experience unpleasant side effects which may include dry mouth, dizziness or visual disturbances. 3. Avoid touching the patch. Wash your hands with soap and water after contact with the patch.

## 2015-06-07 NOTE — Op Note (Signed)
Preoperative diagnosis: right ureteral calculus  Postoperative diagnosis: right ureteral calculus  Procedure:  1. Cystoscopy 2. right ureteroscopy and stone removal 3. Ureteroscopic laser lithotripsy 4. right 40F x 24cm ureteral stent placement  5. right retrograde pyelography with interpretation  Surgeon: Ardis Hughs, MD  Anesthesia: General  Complications: None  Intraoperative findings: right retrograde pyelography demonstrated a filling defect within the right ureter consistent with the patient's known calculus without other abnormalities.  EBL: Minimal  Specimens: 1. right ureteral calculus  Disposition of specimens: [facility] for stone analysis  Indication: Paula [name] is a 54 y.o.   patient with urolithiasis. After reviewing the management options for treatment, the patient elected to proceed with the above surgical procedure(s). We have discussed the potential benefits and risks of the procedure, side effects of the proposed treatment, the likelihood of the patient achieving the goals of the procedure, and any potential problems that might occur during the procedure or recuperation. Informed consent has been obtained.  Description of procedure:  The patient was taken to the operating room and general anesthesia was induced.  The patient was placed in the dorsal lithotomy position, prepped and draped in the usual sterile fashion, and preoperative antibiotics were administered. A preoperative time-out was performed.   Cystourethroscopy was performed.  The patient's urethra was examined and was normal. The bladder was then systematically examined in its entirety. There was no evidence for any bladder tumors, stones, or other mucosal pathology.    Attention then turned to the right ureteral orifice and a ureteral catheter was used to intubate the ureteral orifice.  Omnipaque contrast was injected through the ureteral catheter and a retrograde  pyelogram was performed with findings as dictated above.  A 0.38 sensor guidewire was then advanced up the right ureter into the renal pelvis under fluoroscopic guidance. The 6 Fr semirigid ureteroscope was then advanced into the ureter next to the guidewire and the calculus was identified.   The stone was then fragmented with the 365 micron holmium laser fiber on a setting of 0.6 and frequency of 6 Hz.   All stones were then removed from the ureter with an N-gage nitinol basket.  Reinspection of the ureter revealed no remaining visible stones or fragments.  I then passed a flexible ureteroscope into the patients right collecting system and into the renal pelvis.  Exploration of the calyces revealed a small fragment in the lower pole which was basketed and removed.  No additonal stones were encountered.  The wire was then backloaded through the cystoscope and a ureteral stent was advance over the wire using Seldinger technique.  The stent was positioned appropriately under fluoroscopic and cystoscopic guidance.  The wire was then removed with an adequate stent curl noted in the renal pelvis as well as in the bladder.  The bladder was then emptied and the procedure ended.  The patient appeared to tolerate the procedure well and without complications.  The patient was able to be awakened and transferred to the recovery unit in satisfactory condition.   Disposition:  The patient will be scheduled for stent removal in 5-7 days in our clinic.

## 2015-06-07 NOTE — H&P (Signed)
Reason For Visit Right flank pain   History of Present Illness 54 year old female who presents today as follow-up from the emergency department where she presented 2 days prior with right flank pain. Her pain had been progressing for several weeks. She had intermittent pain initially, most associated with performing daily activities of living. However, over the weekend she developed intense pain with associated nausea and vomiting. He was unable to get comfortable and thus unable to sleep. She then presented to the emergency department. In the emergency department she was noted to be afebrile with no evidence of infection. She underwent a CT scan demonstrating a 9 mm 6 mm right mid ureteral stone with associated proximal hydronephrosis. She was sent home with Percocet, Flomax, and Zofran. The patient states that she has not tolerated the Percocet well. She denies any gross hematuria or dysuria. She has said that she has increased urinary frequency. In addition, the patient states that she is constipated. This is unusual for her she's typically has diarrhea. The patient has a history of stones but this is her first trip to the urologist. She passed her other stone in 2014.   The patient has had her gallbladder removed recently. She tolerated anesthesia without any difficulty. She denies any chest pain. She is nondiabetic.   Past Medical History Problems  1. History of Anxiety (F41.9) 2. History of arthritis (Z87.39) 3. History of cardiac arrhythmia (Z86.79) 4. History of cardiac murmur (Z86.79) 5. History of depression (Z86.59) 6. History of esophageal reflux (Z87.19) 7. History of hypercholesterolemia (Z86.39) 8. History of peptic ulcer (Z87.11) 9. History of sleep apnea (Z87.09)  Surgical History Problems  1. History of Cervical Vertebral Fusion 2. History of Cesarean Section 3. History of Gallbladder Surgery 4. History of Hysterectomy 5. History of Umbilical Hernia Repair  Current  Meds 1. ClonazePAM 0.5 MG Oral Tablet;  Therapy: (Recorded:05Jul2016) to Recorded 2. LORazepam 1 MG Oral Tablet;  Therapy: (Recorded:05Jul2016) to Recorded 3. Omeprazole 20 MG Oral Tablet Delayed Release;  Therapy: (Recorded:05Jul2016) to Recorded 4. Ondansetron HCl - 4 MG Oral Tablet;  Therapy: (Recorded:05Jul2016) to Recorded 5. Oxycodone-Acetaminophen 5-325 MG Oral Tablet;  Therapy: (Recorded:05Jul2016) to Recorded 6. Sertraline HCl - 50 MG Oral Tablet;  Therapy: (Recorded:05Jul2016) to Recorded 7. Tamsulosin HCl - 0.4 MG Oral Capsule;  Therapy: (Recorded:05Jul2016) to Recorded  Allergies Medication  1. Aspirin TABS 2. Avelox TABS 3. Hydrocodone-Acetaminophen TABS 4. Oxycodone-Acetaminophen TABS 5. Gabapentin TABS  Family History Problems  1. No pertinent family history : Mother  Social History Problems    Denied: History of Alcohol use   Caffeine use (F15.90)   Married   Never a smoker   Occupation   disabled  Review of Systems  Genitourinary: urinary frequency, urinary urgency, nocturia and incontinence.  Gastrointestinal: nausea, vomiting, heartburn and diarrhea.  Constitutional: feeling tired (fatigue).  Eyes: blurred vision and diplopia.  Hematologic/Lymphatic: a tendency to easily bruise.  Cardiovascular: chest pain.  Respiratory: shortness of breath.  Endocrine: polydipsia.  Musculoskeletal: back pain and joint pain.  Neurological: dizziness and headache.  Psychiatric: depression and anxiety.    Vitals Vital Signs [Data Includes: Last 1 Day]  Recorded: [id number] 10:01AM  Height: 5 ft 4 in Weight: 236 lb  BMI Calculated: 40.51 BSA Calculated: 2.1 Blood Pressure: 125 / 85 Temperature: 98.1 F Heart Rate: 73  Physical Exam Constitutional: Well nourished and well developed . No acute distress.  ENT:. The ears and nose are normal in appearance.  Pulmonary: No respiratory distress and normal respiratory rhythm  and effort.  Cardiovascular:  Heart rate and rhythm are normal . No peripheral edema.  Abdomen: The abdomen is soft and nontender. No masses are palpated. Right CVA tenderness. No hernias are palpable. No hepatosplenomegaly noted.  Lymphatics: The femoral and inguinal nodes are not enlarged or tender.  Skin: Normal skin turgor, no visible rash and no visible skin lesions.  Neuro/Psych:. Mood and affect are appropriate.    Results/Data Urine [Data Includes: Last 1 Day]   [id number]  COLOR YELLOW   APPEARANCE CLEAR   SPECIFIC GRAVITY 1.025   pH 5.5   GLUCOSE NEG mg/dL  BILIRUBIN NEG   KETONE NEG mg/dL  BLOOD SMALL   PROTEIN NEG mg/dL  UROBILINOGEN 0.2 mg/dL  NITRITE NEG   LEUKOCYTE ESTERASE NEG   SQUAMOUS EPITHELIAL/HPF FEW   WBC NONE SEEN WBC/hpf  RBC 0-2 RBC/hpf  BACTERIA NONE SEEN   CRYSTALS NONE SEEN   CASTS NONE SEEN    The patient's CT scan demonstrates a right mid ureteral stone, hydronephrosis. There are no additional stone. Stone measures 6 mm in width and length of 9 cm.  The patient's urine today demonstrates no evidence of infection.   Assessment Assessed  1. Right ureteral calculus (N20.1)  The patient has a large mid ureteral stone. This can be difficult for her to pass on her own.   Plan Health Maintenance  1. UA With REFLEX; [Do Not Release]; Status:Complete;   Done: [id number] 09:49AM Right ureteral calculus  2. Administer: Ketorolac Tromethamine 60 MG/2ML Intramuscular Solution; INJECT 60  MG  Intramuscular; To Be Done: [id number] 3. Follow-up Schedule Surgery Office  Follow-up  Status: Hold For - Appointment   Requested for: [phone]  Discussion/Summary We discussed the treatment options for a mid ureteral stone. Given that her skin to stone distance is 17-18 cm and the stone is difficult to visualize on her CT scout film I don't think shockwave lithotripsy would be a good option for her. We did discuss medical expulsion therapy, this would take quite a while given the size of her  stone. I recommended that she consider ureteroscopy. We discussed the risks and benefits of ureteroscopy in detail. In particular I discussed the side effects of stent discomfort. Our plan is to proceed with right ureteroscopy, laser lithotripsy, stent placement. I'll plan to leave the string off the stent and have the patient follow-up in 1 week for stent removal.

## 2015-06-07 NOTE — Transfer of Care (Signed)
Immediate Anesthesia Transfer of Care Note  Patient: Paula [name]  Procedure(s) Performed: Procedure(s): RIGHT URETEROSCOPY, LASER LITHOTRIPSY, STONE REMOVAL AND RIGHT URETERAL STENT PLACEMENT (Right) HOLMIUM LASER APPLICATION (N/A) CYSTOSCOPY WITH RETROGRADE PYELOGRAM (Right)  Patient Location: PACU  Anesthesia Type:General  Level of Consciousness: awake, alert , oriented and patient cooperative  Airway & Oxygen Therapy: Patient Spontanous Breathing and Patient connected to nasal cannula oxygen  Post-op Assessment: Report given to RN and Post -op Vital signs reviewed and stable  Post vital signs: Reviewed and stable  Last Vitals:  Filed Vitals:   06/07/15 1013  BP: 161/84  Pulse: 65  Temp: 37.3 C  Resp: 16    Complications: No apparent anesthesia complications

## 2015-06-07 NOTE — Anesthesia Postprocedure Evaluation (Signed)
  Anesthesia Post-op Note  Patient: Paula [name]  Procedure(s) Performed: Procedure(s) (LRB): RIGHT URETEROSCOPY, LASER LITHOTRIPSY, STONE REMOVAL AND RIGHT URETERAL STENT PLACEMENT (Right) HOLMIUM LASER APPLICATION (N/A) CYSTOSCOPY WITH RETROGRADE PYELOGRAM (Right)  Patient Location: PACU  Anesthesia Type: General  Level of Consciousness: awake and alert   Airway and Oxygen Therapy: Patient Spontanous Breathing  Post-op Pain: mild  Post-op Assessment: Post-op Vital signs reviewed, Patient's Cardiovascular Status Stable, Respiratory Function Stable, Patent Airway and No signs of Nausea or vomiting  Last Vitals:  Filed Vitals:   06/07/15 1400  BP: 161/67  Pulse: 62  Temp:   Resp: 19    Post-op Vital Signs: stable   Complications: No apparent anesthesia complications

## 2015-06-12 ENCOUNTER — Encounter (HOSPITAL_BASED_OUTPATIENT_CLINIC_OR_DEPARTMENT_OTHER): Payer: Self-pay | Admitting: Urology

## 2015-06-12 ENCOUNTER — Ambulatory Visit
Admission: RE | Admit: 2015-06-12 | Discharge: 2015-06-12 | Disposition: A | Discharge status: Home / Self Care | Payer: Medicare Other | Source: Ambulatory Visit

## 2015-06-12 DIAGNOSIS — Z1231 Encounter for screening mammogram for malignant neoplasm of breast: Secondary | ICD-10-CM

## 2015-06-13 DIAGNOSIS — F329 Major depressive disorder, single episode, unspecified: Secondary | ICD-10-CM | POA: Diagnosis not present

## 2015-06-13 DIAGNOSIS — R197 Diarrhea, unspecified: Secondary | ICD-10-CM | POA: Diagnosis not present

## 2015-06-13 DIAGNOSIS — K219 Gastro-esophageal reflux disease without esophagitis: Secondary | ICD-10-CM | POA: Diagnosis not present

## 2015-06-15 DIAGNOSIS — N201 Calculus of ureter: Secondary | ICD-10-CM | POA: Diagnosis not present

## 2015-06-28 DIAGNOSIS — R3989 Other symptoms and signs involving the genitourinary system: Secondary | ICD-10-CM | POA: Diagnosis not present

## 2015-06-28 DIAGNOSIS — R3915 Urgency of urination: Secondary | ICD-10-CM | POA: Diagnosis not present

## 2015-07-17 DIAGNOSIS — F444 Conversion disorder with motor symptom or deficit: Secondary | ICD-10-CM | POA: Diagnosis not present

## 2015-07-26 DIAGNOSIS — F444 Conversion disorder with motor symptom or deficit: Secondary | ICD-10-CM | POA: Diagnosis not present

## 2015-07-31 DIAGNOSIS — N393 Stress incontinence (female) (male): Secondary | ICD-10-CM | POA: Diagnosis not present

## 2015-07-31 DIAGNOSIS — N201 Calculus of ureter: Secondary | ICD-10-CM | POA: Diagnosis not present

## 2015-08-01 DIAGNOSIS — R3915 Urgency of urination: Secondary | ICD-10-CM | POA: Diagnosis not present

## 2015-08-01 DIAGNOSIS — M6281 Muscle weakness (generalized): Secondary | ICD-10-CM | POA: Diagnosis not present

## 2015-08-01 DIAGNOSIS — N393 Stress incontinence (female) (male): Secondary | ICD-10-CM | POA: Diagnosis not present

## 2015-08-01 DIAGNOSIS — R278 Other lack of coordination: Secondary | ICD-10-CM | POA: Diagnosis not present

## 2015-08-03 DIAGNOSIS — F444 Conversion disorder with motor symptom or deficit: Secondary | ICD-10-CM | POA: Diagnosis not present

## 2015-08-16 DIAGNOSIS — R197 Diarrhea, unspecified: Secondary | ICD-10-CM | POA: Diagnosis not present

## 2015-08-16 DIAGNOSIS — K219 Gastro-esophageal reflux disease without esophagitis: Secondary | ICD-10-CM | POA: Diagnosis not present

## 2015-08-16 DIAGNOSIS — Z6841 Body Mass Index (BMI) 40.0 and over, adult: Secondary | ICD-10-CM | POA: Diagnosis not present

## 2015-08-16 DIAGNOSIS — E669 Obesity, unspecified: Secondary | ICD-10-CM | POA: Diagnosis not present

## 2015-08-29 DIAGNOSIS — K219 Gastro-esophageal reflux disease without esophagitis: Secondary | ICD-10-CM | POA: Diagnosis not present

## 2015-08-29 DIAGNOSIS — J069 Acute upper respiratory infection, unspecified: Secondary | ICD-10-CM | POA: Diagnosis not present

## 2015-08-29 DIAGNOSIS — R635 Abnormal weight gain: Secondary | ICD-10-CM | POA: Diagnosis not present

## 2015-08-29 DIAGNOSIS — R140 Abdominal distension (gaseous): Secondary | ICD-10-CM | POA: Diagnosis not present

## 2015-10-03 ENCOUNTER — Ambulatory Visit (INDEPENDENT_AMBULATORY_CARE_PROVIDER_SITE_OTHER)
Admission: RE | Admit: 2015-10-03 | Discharge: 2015-10-03 | Disposition: A | Discharge status: Home / Self Care | Payer: Medicare Other | Source: Ambulatory Visit | Attending: Pulmonary Disease | Admitting: Pulmonary Disease

## 2015-10-03 ENCOUNTER — Encounter: Payer: Self-pay | Admitting: Pulmonary Disease

## 2015-10-03 ENCOUNTER — Ambulatory Visit (INDEPENDENT_AMBULATORY_CARE_PROVIDER_SITE_OTHER): Payer: Medicare Other | Admitting: Pulmonary Disease

## 2015-10-03 VITALS — BP 130/80 | HR 88 | Ht 65.0 in | Wt 240.8 lb

## 2015-10-03 DIAGNOSIS — R053 Chronic cough: Secondary | ICD-10-CM

## 2015-10-03 DIAGNOSIS — R05 Cough: Secondary | ICD-10-CM

## 2015-10-03 MED ORDER — CHLORPHENIRAMINE MALEATE 4 MG PO TABS: 4.0000 mg | ORAL_TABLET | Freq: Every day | ORAL | Status: DC

## 2015-10-03 MED ORDER — FLUTICASONE PROPIONATE 50 MCG/ACT NA SUSP: 2.0000 | Freq: Every day | NASAL | Status: DC

## 2015-10-03 NOTE — Patient Instructions (Signed)
Chest xray today Will schedule pulmonary function test [facility] sinus rinse daily Flonase two sprays each nostril daily Chlorpheniramine 4 mg nightly Sip water when you have urge to cough Use sugarless candy to keep your mouth moist Salt water gargles once or twice per day  Follow up in 4 weeks with Dr. Halford Chessman or Tammy Parrett

## 2015-10-03 NOTE — Progress Notes (Deleted)
   Subjective:    Patient ID: Paula [name], female    DOB: 01/30/1961, 54 y.o.   MRN: [id number]  HPI    Review of Systems  Constitutional: Negative for fever and unexpected weight change.  HENT: Positive for congestion and sore throat. Negative for dental problem, ear pain, nosebleeds, postnasal drip, rhinorrhea, sinus pressure, sneezing and trouble swallowing.   Eyes: Negative for redness and itching.  Respiratory: Positive for cough, chest tightness and shortness of breath. Negative for wheezing.   Cardiovascular: Negative for palpitations and leg swelling.  Gastrointestinal: Negative for nausea and vomiting.  Genitourinary: Negative for dysuria.  Musculoskeletal: Positive for joint swelling.  Skin: Negative for rash.  Neurological: Positive for headaches.  Hematological: Does not bruise/bleed easily.  Psychiatric/Behavioral: Negative for dysphoric mood. The patient is nervous/anxious.        Objective:   Physical Exam        Assessment & Plan:

## 2015-10-03 NOTE — Progress Notes (Signed)
Chief Complaint  Patient presents with  . PULMONARY CONSULT    pt states shes had a cough for over 2 months now. pt states sshe has  tried everything. pt states at times she brings mucous up and it can range from yellow to clear. pt c/o chest pain and pain under her left breast area more to the side.     History of Present Illness: Paula [name] is a 54 y.o. female former smoker for evaluation of cough.  Her cough started about 2 months ago.  She has gotten episodes of cough previously, and was on spiriva previously.  She quit smoking few years ago, and smoked up to 1/2 pack per day.    She has been getting raspy voice.  She coughs up clear to yellow sputum.  She denies hemoptysis or fever.  She gets sore in her ribs from coughing.  She denies wheezing, or dysphagia.  She is followed by [facility] for GERD and HH.  She denies skin rash, or joint swelling.  There is no hx of thrombo-embolic disease.  She has not had chest xray or breathing tests recently.  She has not been on antibiotics or prednisone recently.    She grew up in [place].  She denies occupational exposures, or animal exposures.  She denies sick exposures.  She want on a cruise, but her cough pre-dates her travel.    Paula [name]  has a past medical history of Anxiety; Depression; GERD (gastroesophageal reflux disease); Hyperlipidemia; IBS (irritable bowel syndrome); OSA on CPAP; History of hiatal hernia; History of thyroid nodule; History of gastric ulcer; Intermittent vertigo; RLS (restless legs syndrome); Diverticulosis of colon; Psychogenic tremor; Intermittent palpitations; Osteoarthritis; Migraine; Fibromyalgia; Heart murmur; and Refusal of blood transfusions as patient is Jehovah's Witness.  Paula [name]  has past surgical history that includes Cesarean section (x2); Anterior cervical decomp/discectomy fusion (02-10-2004); Laparoscopic cholecystectomy (10-07-2000); Abdominal hysterectomy (1990); Umbilical hernia repair  (age 3); Cardiovascular stress test (10-06-2014  [facility]); Cystoscopy with ureteroscopy and stent placement (Right, 06/07/2015); Holmium laser application (N/A, 04/04/6502); and Cystoscopy w/ retrogrades (Right, 06/07/2015).  Prior to Admission medications   Medication Sig Start Date End Date Taking? Authorizing Provider  acetaminophen (TYLENOL) 500 MG tablet Take 1,000 mg by mouth every 6 (six) hours as needed for moderate pain.   Yes Historical Provider, MD  clonazePAM (KLONOPIN) 0.5 MG tablet Take 0.5 mg by mouth 3 (three) times daily as needed for anxiety.   Yes Historical Provider, MD  esomeprazole (NEXIUM) 40 MG capsule Take 1 capsule (40 mg total) by mouth daily. Patient taking differently: Take 40 mg by mouth every morning.  07/29/14  Yes Aleksei Plotnikov V, MD  ondansetron (ZOFRAN ODT) 4 MG disintegrating tablet 4mg  ODT q4 hours prn nausea/vomit 06/05/15  Yes Comer Locket, PA-C  phenazopyridine (PYRIDIUM) 200 MG tablet Take 1 tablet (200 mg total) by mouth 3 (three) times daily as needed for pain. 06/07/15  Yes Ardis Hughs, MD  sertraline (ZOLOFT) 50 MG tablet Take 50 mg by mouth every evening.    Yes Historical Provider, MD  traMADol (ULTRAM) 50 MG tablet Take 1 tablet (50 mg total) by mouth every 6 (six) hours as needed. 06/07/15  Yes Ardis Hughs, MD    Allergies  Allergen Reactions  . Aspirin Other (See Comments)    Avoids--- history of gastric ulcers   . Avelox [Moxifloxacin] Itching  . Gabapentin Other (See Comments)    Wt gain, tremor  . Hydrocodone-Acetaminophen Nausea  And Vomiting  . Imitrex [Sumatriptan] Other (See Comments)    Body hurts  . Oxycodone Nausea And Vomiting    Her family history includes Cancer in her maternal aunt and maternal grandfather; Colon cancer in her maternal grandfather; Hyperlipidemia in her mother; Hypertension in her brother, maternal grandfather, maternal grandmother, mother, and sister.  She  reports that she quit smoking about 7  years ago. Her smoking use included Cigarettes. She has a 12.5 pack-year smoking history. She has never used smokeless tobacco. She reports that she does not drink alcohol or use illicit drugs.  Review of Systems  Constitutional: Negative for fever and unexpected weight change.  HENT: Positive for congestion and sore throat. Negative for dental problem, ear pain, nosebleeds, postnasal drip, rhinorrhea, sinus pressure, sneezing and trouble swallowing.   Eyes: Negative for redness and itching.  Respiratory: Positive for cough, chest tightness and shortness of breath. Negative for wheezing.   Cardiovascular: Negative for palpitations and leg swelling.  Gastrointestinal: Negative for nausea and vomiting.  Genitourinary: Negative for dysuria.  Musculoskeletal: Positive for joint swelling.  Skin: Negative for rash.  Neurological: Positive for headaches.  Hematological: Does not bruise/bleed easily.  Psychiatric/Behavioral: Negative for dysphoric mood. The patient is nervous/anxious.    Physical Exam: BP 130/80 mmHg  Pulse 88  Ht 5\' 5"  (1.651 m)  Wt 240 lb 12.8 oz (109.226 kg)  BMI 40.07 kg/m2  SpO2 97%  General - No distress ENT - No sinus tenderness, no oral exudate, no LAN, no thyromegaly, TM clear, pupils equal/reactive, prominent turbinates, raspy voice Cardiac - s1s2 regular, no murmur, pulses symmetric Chest - No wheeze/rales/dullness, good air entry, normal respiratory excursion Back - No focal tenderness Abd - Soft, non-tender, no organomegaly, + bowel sounds Ext - No edema Neuro - Normal strength, cranial nerves intact Skin - No rashes Psych - Normal mood, and behavior   Lab Results  Component Value Date   WBC 10.9* 06/05/2015   HGB 12.0 06/05/2015   HCT 36.2 06/05/2015   MCV 87.2 06/05/2015   PLT 342 06/05/2015    Lab Results  Component Value Date   CREATININE 1.28* 06/05/2015   BUN 15 06/05/2015   NA 138 06/05/2015   K 3.9 06/05/2015   CL 102 06/05/2015   CO2  24 06/05/2015    Lab Results  Component Value Date   ALT 33 06/05/2015   AST 32 06/05/2015   ALKPHOS 78 06/05/2015   BILITOT 0.8 06/05/2015    Lab Results  Component Value Date   TSH 2.18 11/28/2014    BNP    Component Value Date/Time   PROBNP 43.6 07/02/2014 2212    Discussion: She has intermittent episodes of cough, and has been on inhaler therapy before.  She denies every being told that she has asthma.  She has history of smoking, and reflux.  Her current symptoms are also concerning for post-nasal drip.  Assessment/Plan:  Chronic cough with hx of smoking. Plan: - chest xray today - will schedule PFT - defer Abx, inhaler therapy for now  Upper airway cough syndrome. Plan: - will have her use nasal irrigation, flonase, and chlorpheniramine - she can use salt water gargles, sip water, and use sugarless candy  GERD, hiatal hernia. Plan: - she is followed by Dr. Amedeo Plenty with [facility]  Obstructive sleep apnea. Plan: - continue CPAP per Dr. Elsworth Soho >> will need to discuss with pt at next visit whether she would like to have Dr. Elsworth Soho manage her cough also  Chesley Mires, MD [place] [facility] Pager:  [phone]

## 2015-10-04 ENCOUNTER — Institutional Professional Consult (permissible substitution): Payer: Self-pay | Admitting: Pulmonary Disease

## 2015-10-09 ENCOUNTER — Telehealth: Payer: Self-pay | Admitting: Pulmonary Disease

## 2015-10-09 NOTE — Telephone Encounter (Signed)
Patient notified of CXR results.  Patient says she is still coughing, has no voice, medication is not working. Nothing has changed, she wants to know what Dr. Halford Chessman recommends going forward.

## 2015-10-09 NOTE — Telephone Encounter (Signed)
Dg Chest 2 View  10/03/2015  CLINICAL DATA:  Cough. EXAM: CHEST  2 VIEW COMPARISON:  05/02/2014. FINDINGS: Mediastinum and hilar structures normal. Lungs are clear. Heart size normal. No pleural effusion or pneumothorax. No acute bony abnormality. Prior cervical spine fusion . IMPRESSION: No acute cardiopulmonary disease. Electronically Signed   By: Marcello Moores  Register   On: 10/03/2015 13:31     Will have my nurse inform pt that CXR was normal.

## 2015-10-10 MED ORDER — AZITHROMYCIN 250 MG PO TABS: ORAL_TABLET | ORAL | Status: DC

## 2015-10-10 MED ORDER — BENZONATATE 200 MG PO CAPS: 200.0000 mg | ORAL_CAPSULE | Freq: Three times a day (TID) | ORAL | Status: DC | PRN

## 2015-10-10 MED ORDER — ALBUTEROL SULFATE HFA 108 (90 BASE) MCG/ACT IN AERS: INHALATION_SPRAY | RESPIRATORY_TRACT | Status: DC

## 2015-10-10 NOTE — Telephone Encounter (Signed)
Patient notified of Dr. Juanetta Gosling recommendations. Rx sent to [facility] per patient's request. Nothing further needed. Closing encounter

## 2015-10-10 NOTE — Telephone Encounter (Signed)
Send script for zpak.  Send script for ventolin hfa two puffs every four to six hours prn cough, wheeze.  Have her take nexium 40 mg bid before meals.  Send script for tessalon 200 mg tid prn cough.

## 2015-10-23 ENCOUNTER — Other Ambulatory Visit: Payer: Self-pay | Admitting: Gastroenterology

## 2015-10-23 DIAGNOSIS — R197 Diarrhea, unspecified: Secondary | ICD-10-CM

## 2015-10-30 ENCOUNTER — Ambulatory Visit
Admission: RE | Admit: 2015-10-30 | Discharge: 2015-10-30 | Disposition: A | Discharge status: Home / Self Care | Payer: Medicare Other | Source: Ambulatory Visit | Attending: Gastroenterology | Admitting: Gastroenterology

## 2015-10-30 DIAGNOSIS — R197 Diarrhea, unspecified: Secondary | ICD-10-CM

## 2015-10-30 DIAGNOSIS — K224 Dyskinesia of esophagus: Secondary | ICD-10-CM | POA: Diagnosis not present

## 2015-11-08 ENCOUNTER — Ambulatory Visit (INDEPENDENT_AMBULATORY_CARE_PROVIDER_SITE_OTHER): Payer: Medicare Other | Admitting: Adult Health

## 2015-11-08 ENCOUNTER — Ambulatory Visit (INDEPENDENT_AMBULATORY_CARE_PROVIDER_SITE_OTHER): Payer: Medicare Other | Admitting: Pulmonary Disease

## 2015-11-08 ENCOUNTER — Encounter: Payer: Self-pay | Admitting: Adult Health

## 2015-11-08 VITALS — BP 128/88 | HR 85 | Temp 98.1°F | Ht 65.0 in | Wt 244.0 lb

## 2015-11-08 DIAGNOSIS — R05 Cough: Secondary | ICD-10-CM

## 2015-11-08 DIAGNOSIS — K219 Gastro-esophageal reflux disease without esophagitis: Secondary | ICD-10-CM | POA: Diagnosis not present

## 2015-11-08 DIAGNOSIS — R059 Cough, unspecified: Secondary | ICD-10-CM | POA: Insufficient documentation

## 2015-11-08 DIAGNOSIS — R053 Chronic cough: Secondary | ICD-10-CM

## 2015-11-08 LAB — PULMONARY FUNCTION TEST
DL/VA % pred: 108 %
DL/VA: 5.22 ml/min/mmHg/L
DLCO UNC % PRED: 82 %
DLCO UNC: 19.95 ml/min/mmHg
FEF 25-75 PRE: 3.67 L/s
FEF 25-75 Post: 3.5 L/sec
FEF2575-%Change-Post: -4 %
FEF2575-%PRED-POST: 155 %
FEF2575-%Pred-Pre: 163 %
FEV1-%CHANGE-POST: -2 %
FEV1-%PRED-POST: 99 %
FEV1-%PRED-PRE: 101 %
FEV1-Post: 2.18 L
FEV1-Pre: 2.22 L
FEV1FVC-%Change-Post: 1 %
FEV1FVC-%Pred-Pre: 113 %
FEV6-%CHANGE-POST: -3 %
FEV6-%PRED-POST: 87 %
FEV6-%Pred-Pre: 90 %
FEV6-Post: 2.37 L
FEV6-Pre: 2.44 L
FEV6FVC-%PRED-POST: 103 %
FEV6FVC-%Pred-Pre: 103 %
FVC-%Change-Post: -3 %
FVC-%Pred-Post: 85 %
FVC-%Pred-Pre: 88 %
FVC-POST: 2.37 L
FVC-Pre: 2.44 L
POST FEV6/FVC RATIO: 100 %
PRE FEV6/FVC RATIO: 100 %
Post FEV1/FVC ratio: 92 %
Pre FEV1/FVC ratio: 91 %
RV % PRED: 78 %
RV: 1.5 L
TLC % PRED: 79 %
TLC: 4.03 L

## 2015-11-08 NOTE — Progress Notes (Signed)
Subjective:    Patient ID: Paula [name], female    DOB: 09/10/1961, 53 y.o.   MRN: [id number]  HPI 54 year old female former smoker seen for pulmonary consult 10/03/2015 with Dr. Halford Chessman for cough. Patient sleep apnea on nocturnal C Pap followed by Dr. Elsworth Soho  11/08/2015 Follow up : Cough and PFT  Patient returns for a one-month follow-up. Patient was seen for pulmonary consult last month for upper airway cough syndrome for 3 month.  Cough has been refractory to cough syrups, nasal spray , otc remedies .  She was recommended to use Flonase and Chlor-Trimeton.. Patient had PFTs today that showed low lung function with an FEV1  at 99%, ratio 92, no significant bronchodilator response, FVC 85%. DLCO 82%. Today O2 saturations 100%.. CXR was clear .  Underwent upper GI series , esophagitis , gastritis and esophageal dysmotility , +reflux . Seen by GI , Dr. Amedeo Plenty. Says she is on prilosec , takes Twice daily  (misses second dose) . Despite this she has reflux come up into her throat esp at night.  Since last visit. Patient reports she tends to have cough. Has intermittent congestion with yellow mucus.  She denies any hemoptysis, chest pain, orthopnea, PND, or increased leg swelling.  Past Medical History  Diagnosis Date  . Anxiety   . Depression   . GERD (gastroesophageal reflux disease)   . Hyperlipidemia   . IBS (irritable bowel syndrome)   . OSA on CPAP     study in epic 02-01-2014  . History of hiatal hernia   . History of thyroid nodule   . History of gastric ulcer   . Intermittent vertigo   . RLS (restless legs syndrome)   . Diverticulosis of colon   . Psychogenic tremor     intermittant head tremor--  stress related  . Intermittent palpitations     stress related--  normal stress test   . Osteoarthritis     hips , knees , left side leg weakness and pain  . Migraine     daily-- ?stress  . Fibromyalgia   . Heart murmur     per pt murmur but does state has been heard by  doctors in awhile//  note from cardiologist dr hochrein no murmur heard  . Refusal of blood transfusions as patient is Jehovah's Witness    Current Outpatient Prescriptions on File Prior to Visit  Medication Sig Dispense Refill  . acetaminophen (TYLENOL) 500 MG tablet Take 1,000 mg by mouth every 6 (six) hours as needed for moderate pain.    [name] albuterol (PROVENTIL HFA;VENTOLIN HFA) 108 (90 BASE) MCG/ACT inhaler 2 puffs every 4 - 6 hours as needed for cough, wheezing 1 Inhaler 6  . azithromycin (ZITHROMAX) 250 MG tablet Use as directed. 6 tablet 0  . benzonatate (TESSALON) 200 MG capsule Take 1 capsule (200 mg total) by mouth 3 (three) times daily as needed for cough. 20 capsule 0  . chlorpheniramine (CHLOR-TRIMETON) 4 MG tablet Take 1 tablet (4 mg total) by mouth at bedtime. 30 tablet 0  . clonazePAM (KLONOPIN) 0.5 MG tablet Take 0.5 mg by mouth 3 (three) times daily as needed for anxiety.    [name] esomeprazole (NEXIUM) 40 MG capsule Take 1 capsule (40 mg total) by mouth daily. (Patient taking differently: Take 40 mg by mouth every morning. ) 90 capsule 2  . fluticasone (FLONASE) 50 MCG/ACT nasal spray Place 2 sprays into both nostrils daily. 16 g 2  . ondansetron (ZOFRAN ODT) 4 MG  disintegrating tablet 4mg  ODT q4 hours prn nausea/vomit 4 tablet 0  . phenazopyridine (PYRIDIUM) 200 MG tablet Take 1 tablet (200 mg total) by mouth 3 (three) times daily as needed for pain. 10 tablet 0  . sertraline (ZOLOFT) 50 MG tablet Take 50 mg by mouth every evening.     . traMADol (ULTRAM) 50 MG tablet Take 1 tablet (50 mg total) by mouth every 6 (six) hours as needed. 30 tablet 0   No current facility-administered medications on file prior to visit.      Review of Systems Constitutional:   No  weight loss, night sweats,  Fevers, chills, fatigue, or  lassitude.  HEENT:   No headaches,  Difficulty swallowing,  Tooth/dental problems, or  Sore throat,                No sneezing, itching, ear ache, nasal  congestion, post nasal drip,   CV:  No chest pain,  Orthopnea, PND, swelling in lower extremities, anasarca, dizziness, palpitations, syncope.   GI  + heartburn, indigestion,  No abdominal pain, nausea, vomiting, diarrhea, change in bowel habits, loss of appetite, bloody stools.   Resp: No shortness of breath with exertion or at rest.  No excess mucus, no productive cough,  No non-productive cough,  No coughing up of blood.  No change in color of mucus.  No wheezing.  No chest wall deformity  Skin: no rash or lesions.  GU: no dysuria, change in color of urine, no urgency or frequency.  No flank pain, no hematuria   MS:  No joint pain or swelling.  No decreased range of motion.  No back pain.  Psych:  No change in mood or affect. No depression or anxiety.  No memory loss.         Objective:   Physical Exam GEN: A/Ox3; pleasant , NAD, obese   HEENT:  [place]/AT,  EACs-clear, TMs-wnl, NOSE-clear, THROAT-clear, no lesions, no postnasal drip or exudate noted.   NECK:  Supple w/ fair ROM; no JVD; normal carotid impulses w/o bruits; no thyromegaly or nodules palpated; no lymphadenopathy.  RESP  Clear  P & A; w/o, wheezes/ rales/ or rhonchi.no accessory muscle use, no dullness to percussion  CARD:  RRR, no m/r/g  , no peripheral edema, pulses intact, no cyanosis or clubbing.  GI:   Soft & nt; nml bowel sounds; no organomegaly or masses detected.  Musco: Warm bil, no deformities or joint swelling noted.   Neuro: alert, no focal deficits noted.    Skin: Warm, no lesions or rashes         Assessment & Plan:

## 2015-11-08 NOTE — Patient Instructions (Signed)
Take Prilosec 20mg  Twice daily  Before meal .  Add Pepcid 20mg  At bedtime   GERD diet  Add Chlortrimeton 4mg  2 At bedtime  For drainage.  Begin Allegra 180mg  daily for drainage.  Use Robitussum DM 1-2 tsp every 4hr as needed for cough .  NO MINTS  Follow up with GI for GERD issues.  follow up Dr. Elsworth Soho  In 2 months and As needed

## 2015-11-08 NOTE — Assessment & Plan Note (Signed)
UACS -suspect AR/GERD related PFT and cxr nml   Plan  Take Prilosec 20mg  Twice daily  Before meal .  Add Pepcid 20mg  At bedtime   GERD diet  Add Chlortrimeton 4mg  2 At bedtime  For drainage.  Begin Allegra 180mg  daily for drainage.  Use Robitussum DM 1-2 tsp every 4hr as needed for cough .  NO MINTS  Follow up with GI for GERD issues.  follow up Dr. Elsworth Soho  In 2 months and As needed

## 2015-11-08 NOTE — Assessment & Plan Note (Signed)
Uncontrolled GERD despite PPI  Cont follow up with GI as planned  GERD diet PPI compliance discussed  Add pepcid  Plan  Take Prilosec 20mg  Twice daily  Before meal .  Add Pepcid 20mg  At bedtime   GERD diet  follow up Dr. Elsworth Soho  In 2 months and As needed

## 2015-11-08 NOTE — Progress Notes (Signed)
PFT done today. 

## 2015-11-10 ENCOUNTER — Telehealth: Payer: Self-pay | Admitting: Pulmonary Disease

## 2015-11-10 MED ORDER — AZITHROMYCIN 250 MG PO TABS: 250.0000 mg | ORAL_TABLET | ORAL | Status: DC

## 2015-11-10 NOTE — Telephone Encounter (Signed)
[phone] pt calling back

## 2015-11-10 NOTE — Telephone Encounter (Signed)
Patient Returned call  [phone]

## 2015-11-10 NOTE — Progress Notes (Signed)
Reviewed & agree with plan  

## 2015-11-10 NOTE — Telephone Encounter (Signed)
lmtcb X1 for pt  

## 2015-11-10 NOTE — Telephone Encounter (Signed)
Spoke with pt, c/o sore throat, PND, laryngitis, nonprod cough X2 days.   Denies fever, nausea, chills.  Pt has taken honey/lemon cough drops, saline spray, throat spray.    Pt uses [facility] on [address]    RA please advise on recs.  Thanks!

## 2015-11-10 NOTE — Telephone Encounter (Signed)
lmtcb

## 2015-11-10 NOTE — Telephone Encounter (Signed)
Pt aware of rec's per RA ZPAK has been called into pharmacy.  Pt reports using Delsym OTC currently for her cough and will continue.  Pt to contact our office Monday if not improved. Nothing further needed.

## 2015-11-10 NOTE — Telephone Encounter (Signed)
zpak DELSYm prn cough

## 2015-12-03 HISTORY — PX: CARDIOVASCULAR STRESS TEST: SHX262

## 2015-12-05 ENCOUNTER — Ambulatory Visit: Payer: Medicare Other | Admitting: Diagnostic Neuroimaging

## 2015-12-05 DIAGNOSIS — J069 Acute upper respiratory infection, unspecified: Secondary | ICD-10-CM | POA: Diagnosis not present

## 2015-12-05 DIAGNOSIS — M5430 Sciatica, unspecified side: Secondary | ICD-10-CM | POA: Diagnosis not present

## 2015-12-05 DIAGNOSIS — R05 Cough: Secondary | ICD-10-CM | POA: Diagnosis not present

## 2015-12-12 ENCOUNTER — Other Ambulatory Visit: Payer: Self-pay | Admitting: Family Medicine

## 2015-12-12 DIAGNOSIS — M5417 Radiculopathy, lumbosacral region: Secondary | ICD-10-CM

## 2015-12-12 DIAGNOSIS — E669 Obesity, unspecified: Secondary | ICD-10-CM | POA: Diagnosis not present

## 2015-12-12 DIAGNOSIS — F329 Major depressive disorder, single episode, unspecified: Secondary | ICD-10-CM | POA: Diagnosis not present

## 2015-12-12 DIAGNOSIS — Z6841 Body Mass Index (BMI) 40.0 and over, adult: Secondary | ICD-10-CM | POA: Diagnosis not present

## 2015-12-12 DIAGNOSIS — M5432 Sciatica, left side: Secondary | ICD-10-CM

## 2015-12-12 DIAGNOSIS — M5430 Sciatica, unspecified side: Secondary | ICD-10-CM | POA: Diagnosis not present

## 2015-12-20 ENCOUNTER — Other Ambulatory Visit: Payer: Self-pay

## 2015-12-20 DIAGNOSIS — R1084 Generalized abdominal pain: Secondary | ICD-10-CM | POA: Diagnosis not present

## 2015-12-20 DIAGNOSIS — Z800 Family history of malignant neoplasm of digestive organs: Secondary | ICD-10-CM | POA: Diagnosis not present

## 2015-12-20 DIAGNOSIS — R635 Abnormal weight gain: Secondary | ICD-10-CM | POA: Diagnosis not present

## 2015-12-20 DIAGNOSIS — R197 Diarrhea, unspecified: Secondary | ICD-10-CM | POA: Diagnosis not present

## 2015-12-24 ENCOUNTER — Other Ambulatory Visit: Payer: Self-pay

## 2015-12-30 ENCOUNTER — Other Ambulatory Visit: Payer: Self-pay

## 2016-01-03 ENCOUNTER — Ambulatory Visit
Admission: RE | Admit: 2016-01-03 | Discharge: 2016-01-03 | Disposition: A | Discharge status: Home / Self Care | Payer: Commercial Managed Care - HMO | Source: Ambulatory Visit | Attending: Family Medicine | Admitting: Family Medicine

## 2016-01-03 DIAGNOSIS — M5417 Radiculopathy, lumbosacral region: Secondary | ICD-10-CM

## 2016-01-03 DIAGNOSIS — M545 Low back pain: Secondary | ICD-10-CM | POA: Diagnosis not present

## 2016-01-03 DIAGNOSIS — M5432 Sciatica, left side: Secondary | ICD-10-CM

## 2016-01-05 ENCOUNTER — Encounter (HOSPITAL_COMMUNITY): Payer: Self-pay | Admitting: Emergency Medicine

## 2016-01-05 ENCOUNTER — Emergency Department (HOSPITAL_COMMUNITY)
Admission: EM | Admit: 2016-01-05 | Discharge: 2016-01-05 | Disposition: A | Discharge status: Home / Self Care | Payer: Commercial Managed Care - HMO | Attending: Emergency Medicine | Admitting: Emergency Medicine

## 2016-01-05 DIAGNOSIS — R011 Cardiac murmur, unspecified: Secondary | ICD-10-CM | POA: Diagnosis not present

## 2016-01-05 DIAGNOSIS — Z7951 Long term (current) use of inhaled steroids: Secondary | ICD-10-CM | POA: Diagnosis not present

## 2016-01-05 DIAGNOSIS — H5710 Ocular pain, unspecified eye: Secondary | ICD-10-CM | POA: Diagnosis not present

## 2016-01-05 DIAGNOSIS — Z9981 Dependence on supplemental oxygen: Secondary | ICD-10-CM | POA: Diagnosis not present

## 2016-01-05 DIAGNOSIS — M542 Cervicalgia: Secondary | ICD-10-CM | POA: Diagnosis not present

## 2016-01-05 DIAGNOSIS — Z79899 Other long term (current) drug therapy: Secondary | ICD-10-CM | POA: Insufficient documentation

## 2016-01-05 DIAGNOSIS — M160 Bilateral primary osteoarthritis of hip: Secondary | ICD-10-CM | POA: Insufficient documentation

## 2016-01-05 DIAGNOSIS — Z8639 Personal history of other endocrine, nutritional and metabolic disease: Secondary | ICD-10-CM | POA: Diagnosis not present

## 2016-01-05 DIAGNOSIS — M170 Bilateral primary osteoarthritis of knee: Secondary | ICD-10-CM | POA: Insufficient documentation

## 2016-01-05 DIAGNOSIS — G4733 Obstructive sleep apnea (adult) (pediatric): Secondary | ICD-10-CM | POA: Insufficient documentation

## 2016-01-05 DIAGNOSIS — F329 Major depressive disorder, single episode, unspecified: Secondary | ICD-10-CM | POA: Diagnosis not present

## 2016-01-05 DIAGNOSIS — R51 Headache: Secondary | ICD-10-CM | POA: Diagnosis not present

## 2016-01-05 DIAGNOSIS — K219 Gastro-esophageal reflux disease without esophagitis: Secondary | ICD-10-CM | POA: Diagnosis not present

## 2016-01-05 DIAGNOSIS — F419 Anxiety disorder, unspecified: Secondary | ICD-10-CM | POA: Diagnosis not present

## 2016-01-05 DIAGNOSIS — M62838 Other muscle spasm: Secondary | ICD-10-CM | POA: Diagnosis not present

## 2016-01-05 DIAGNOSIS — M797 Fibromyalgia: Secondary | ICD-10-CM | POA: Insufficient documentation

## 2016-01-05 DIAGNOSIS — Z87891 Personal history of nicotine dependence: Secondary | ICD-10-CM | POA: Diagnosis not present

## 2016-01-05 DIAGNOSIS — G2581 Restless legs syndrome: Secondary | ICD-10-CM | POA: Insufficient documentation

## 2016-01-05 DIAGNOSIS — M25512 Pain in left shoulder: Secondary | ICD-10-CM | POA: Insufficient documentation

## 2016-01-05 DIAGNOSIS — R519 Headache, unspecified: Secondary | ICD-10-CM

## 2016-01-05 MED ORDER — ACETAMINOPHEN 500 MG PO TABS
1000.0000 mg | ORAL_TABLET | Freq: Once | ORAL | Status: AC
  Administered 2016-01-05: 1000 mg via ORAL
  Filled 2016-01-05: qty 2

## 2016-01-05 MED ORDER — OXYCODONE-ACETAMINOPHEN 5-325 MG PO TABS: 1.0000 | ORAL_TABLET | ORAL | Status: DC | PRN

## 2016-01-05 MED ORDER — CYCLOBENZAPRINE HCL 10 MG PO TABS: 10.0000 mg | ORAL_TABLET | Freq: Three times a day (TID) | ORAL | Status: DC | PRN

## 2016-01-05 MED ORDER — OXYCODONE-ACETAMINOPHEN 5-325 MG PO TABS
1.0000 | ORAL_TABLET | Freq: Once | ORAL | Status: AC
  Administered 2016-01-05: 1 via ORAL
  Filled 2016-01-05: qty 1

## 2016-01-05 MED ORDER — LORAZEPAM 1 MG PO TABS
1.0000 mg | ORAL_TABLET | Freq: Once | ORAL | Status: AC
  Administered 2016-01-05: 1 mg via ORAL
  Filled 2016-01-05: qty 1

## 2016-01-05 NOTE — Discharge Instructions (Signed)

## 2016-01-05 NOTE — ED Notes (Signed)
Patient is having pain in left eye, the bottom of her neck, pain in the right shoulder. Patient states that she is also dizzy. Patient says this has been going on since Wednesday of this week. Patient is having facial numbness on the left side of her face. Patient was asked to smile. There was no facial drooping.

## 2016-01-05 NOTE — ED Provider Notes (Signed)
CSN: [id number]     Arrival date & time 01/05/16  0430 History   First MD Initiated Contact with Patient 01/05/16 [phone]     Chief Complaint  Patient presents with  . Numbness  . Anxiety  . Eye Pain     (Consider location/radiation/quality/duration/timing/severity/associated sxs/prior Treatment) HPI Comments: This is a morbidly obese African-American female who presents to the emergency room with 2 days of left-sided headache, left facial pain, left neck pain and left.  She states last night she took an Ultram without any relief.  She also states that she is out of her other medications for a while.  Denies any shortness of breath or chest pain.  No nausea  Patient is a 55 y.o. female presenting with anxiety and eye pain. The history is provided by the patient.  Anxiety This is a recurrent problem. The current episode started yesterday. The problem occurs constantly. The problem has been gradually worsening. Associated symptoms include headaches, myalgias and neck pain. Pertinent negatives include no chest pain, congestion, fever, joint swelling, nausea, numbness, visual change or weakness. Exacerbated by: movement. She has tried nothing for the symptoms. The treatment provided no relief.  Eye Pain Associated symptoms include headaches, myalgias and neck pain. Pertinent negatives include no chest pain, congestion, fever, joint swelling, nausea, numbness, visual change or weakness.    Past Medical History  Diagnosis Date  . Anxiety   . Depression   . GERD (gastroesophageal reflux disease)   . Hyperlipidemia   . IBS (irritable bowel syndrome)   . OSA on CPAP     study in epic 02-01-2014  . History of hiatal hernia   . History of thyroid nodule   . History of gastric ulcer   . Intermittent vertigo   . RLS (restless legs syndrome)   . Diverticulosis of colon   . Psychogenic tremor     intermittant head tremor--  stress related  . Intermittent palpitations     stress related--  normal  stress test   . Osteoarthritis     hips , knees , left side leg weakness and pain  . Migraine     daily-- ?stress  . Fibromyalgia   . Heart murmur     per pt murmur but does state has been heard by doctors in awhile//  note from cardiologist dr hochrein no murmur heard  . Refusal of blood transfusions as patient is Jehovah's Witness    Past Surgical History  Procedure Laterality Date  . Cesarean section  x2  . Anterior cervical decomp/discectomy fusion  02-10-2004    C4 -- C7  . Laparoscopic cholecystectomy  10-07-2000  . Abdominal hysterectomy  1990  . Umbilical hernia repair  age 58  . Cardiovascular stress test  10-06-2014  [facility]    Lexiscan no exercise study;  normal study,  no ischemia or scar,  normal LV function and wall motion, ef 67%  . Cystoscopy with ureteroscopy and stent placement Right 06/07/2015    Procedure: RIGHT URETEROSCOPY, LASER LITHOTRIPSY, STONE REMOVAL AND RIGHT URETERAL STENT PLACEMENT;  Surgeon: Ardis Hughs, MD;  Location: [facility];  Service: Urology;  Laterality: Right;  . Holmium laser application N/A [id number]    Procedure: HOLMIUM LASER APPLICATION;  Surgeon: Ardis Hughs, MD;  Location: [facility];  Service: Urology;  Laterality: N/A;  . Cystoscopy w/ retrogrades Right 06/07/2015    Procedure: CYSTOSCOPY WITH RETROGRADE PYELOGRAM;  Surgeon: Ardis Hughs, MD;  Location: [facility];  Service: Urology;  Laterality: Right;   Family History  Problem Relation Age of Onset  . Colon cancer Maternal Grandfather   . Hypertension Maternal Grandfather   . Cancer Maternal Grandfather     lung  . Hyperlipidemia Mother   . Hypertension Mother   . Hypertension Maternal Grandmother   . Hypertension Brother   . Hypertension Sister   . Cancer Maternal Aunt     lung   Social History  Substance Use Topics  . Smoking status: Former Smoker -- 0.50 packs/day for 25 years    Types: Cigarettes     Quit date: 07/24/2008  . Smokeless tobacco: Never Used  . Alcohol Use: No   OB History    Gravida Para Term Preterm AB TAB SAB Ectopic Multiple Living   3 2 2  1 1    2      Review of Systems  Constitutional: Negative for fever.  HENT: Negative for congestion.   Eyes: Positive for pain.  Respiratory: Negative for shortness of breath.   Cardiovascular: Negative for chest pain and leg swelling.  Gastrointestinal: Negative for nausea.  Musculoskeletal: Positive for myalgias and neck pain. Negative for joint swelling.  Neurological: Positive for headaches. Negative for dizziness, weakness and numbness.  All other systems reviewed and are negative.     Allergies  Aspirin; Avelox; Gabapentin; Hydrocodone-acetaminophen; Imitrex; and Oxycodone  Home Medications   Prior to Admission medications   Medication Sig Start Date End Date Taking? Authorizing Provider  acetaminophen (TYLENOL) 500 MG tablet Take 1,000 mg by mouth every 6 (six) hours as needed for moderate pain.    Historical Provider, MD  albuterol (PROVENTIL HFA;VENTOLIN HFA) 108 (90 BASE) MCG/ACT inhaler 2 puffs every 4 - 6 hours as needed for cough, wheezing 10/10/15   Chesley Mires, MD  azithromycin (ZITHROMAX) 250 MG tablet Use as directed. 10/10/15   Chesley Mires, MD  azithromycin (ZITHROMAX) 250 MG tablet Take 1 tablet (250 mg total) by mouth as directed. 11/10/15   Rigoberto Noel, MD  benzonatate (TESSALON) 200 MG capsule Take 1 capsule (200 mg total) by mouth 3 (three) times daily as needed for cough. 10/10/15   Chesley Mires, MD  chlorpheniramine (CHLOR-TRIMETON) 4 MG tablet Take 1 tablet (4 mg total) by mouth at bedtime. 10/03/15   Chesley Mires, MD  clonazePAM (KLONOPIN) 0.5 MG tablet Take 0.5 mg by mouth 3 (three) times daily as needed for anxiety.    Historical Provider, MD  esomeprazole (NEXIUM) 40 MG capsule Take 1 capsule (40 mg total) by mouth daily. Patient taking differently: Take 40 mg by mouth every morning.  07/29/14    Aleksei Plotnikov V, MD  fluticasone (FLONASE) 50 MCG/ACT nasal spray Place 2 sprays into both nostrils daily. 10/03/15   Chesley Mires, MD  ondansetron (ZOFRAN ODT) 4 MG disintegrating tablet 4mg  ODT q4 hours prn nausea/vomit 06/05/15   Comer Locket, PA-C  phenazopyridine (PYRIDIUM) 200 MG tablet Take 1 tablet (200 mg total) by mouth 3 (three) times daily as needed for pain. 06/07/15   Ardis Hughs, MD  sertraline (ZOLOFT) 50 MG tablet Take 50 mg by mouth every evening.     Historical Provider, MD  traMADol (ULTRAM) 50 MG tablet Take 1 tablet (50 mg total) by mouth every 6 (six) hours as needed. 06/07/15   Ardis Hughs, MD   BP 154/82 mmHg  Pulse 76  Temp(Src) 98.7 F (37.1 C) (Oral)  Resp 20  Ht 5\' 5"  (1.651 m)  Wt 108.863 kg  BMI 39.94 kg/m2  SpO2 98% Physical Exam  Constitutional: She is oriented to person, place, and time. She appears well-developed and well-nourished.  HENT:  Head: Normocephalic.  Right Ear: External ear normal.  Left Ear: External ear normal.  Mouth/Throat: Oropharynx is clear and moist.  Eyes: Pupils are equal, round, and reactive to light.  Neck: Normal range of motion. Muscular tenderness present. No spinous process tenderness present.    Cardiovascular: Normal rate and regular rhythm.   Pulmonary/Chest: Effort normal and breath sounds normal.  Musculoskeletal: She exhibits no edema or tenderness.       Left shoulder: She exhibits decreased range of motion and pain. She exhibits no tenderness, no swelling, no effusion, no deformity, no spasm and normal pulse.  Neurological: She is alert and oriented to person, place, and time.  Skin: Skin is warm and dry. No rash noted. No erythema.  Nursing note and vitals reviewed.   ED Course  Procedures (including critical care time) Labs Review Labs Reviewed - No data to display  Imaging Review Mr Lumbar Spine Wo Contrast  01/03/2016  CLINICAL DATA:  Low back and bilateral leg pain with numbness and  tingling extending into the left lower extremity. Progression over the last several months. EXAM: MRI LUMBAR SPINE WITHOUT CONTRAST TECHNIQUE: Multiplanar, multisequence MR imaging of the lumbar spine was performed. No intravenous contrast was administered. COMPARISON:  MRI lumbar spine 12/18/2014. FINDINGS: Normal signal is present in the conus medullaris which terminates at T12. Marrow signal, vertebral body heights, alignment are normal. Limited imaging of the abdomen is unremarkable. Normal disc signal is preserved. There is no focal protrusion or stenosis. Mild facet hypertrophy is present as before. IMPRESSION: No focal disc protrusion or stenosis. Mild multilevel facet disease. Electronically Signed   By: San Morelle M.D.   On: 01/03/2016 19:57   I have personally reviewed and evaluated these images and lab results as part of my medical decision-making.   EKG Interpretation None     patient appears uncomfortable.  She is holding herself very stiffly refusing to move her left shoulder. Patient reassess.  She states that the headache is resolving and the neck pain is resolving as well.  She states that she has called her primary care physician and should be able to get refills on her medication MDM   Final diagnoses:  None         Junius Creamer, NP 01/05/16 [id number]  Jola Schmidt, MD 01/05/16 [phone]

## 2016-01-10 DIAGNOSIS — R197 Diarrhea, unspecified: Secondary | ICD-10-CM | POA: Diagnosis not present

## 2016-01-15 DIAGNOSIS — F411 Generalized anxiety disorder: Secondary | ICD-10-CM | POA: Diagnosis not present

## 2016-01-16 ENCOUNTER — Ambulatory Visit: Payer: Self-pay | Admitting: Pulmonary Disease

## 2016-01-19 DIAGNOSIS — G959 Disease of spinal cord, unspecified: Secondary | ICD-10-CM | POA: Diagnosis not present

## 2016-01-19 DIAGNOSIS — M542 Cervicalgia: Secondary | ICD-10-CM | POA: Diagnosis not present

## 2016-01-19 DIAGNOSIS — M5412 Radiculopathy, cervical region: Secondary | ICD-10-CM | POA: Diagnosis not present

## 2016-01-24 ENCOUNTER — Other Ambulatory Visit: Payer: Self-pay | Admitting: Orthopedic Surgery

## 2016-01-24 DIAGNOSIS — G959 Disease of spinal cord, unspecified: Secondary | ICD-10-CM

## 2016-02-02 ENCOUNTER — Ambulatory Visit
Admission: RE | Admit: 2016-02-02 | Discharge: 2016-02-02 | Disposition: A | Discharge status: Home / Self Care | Payer: Commercial Managed Care - HMO | Source: Ambulatory Visit | Attending: Orthopedic Surgery | Admitting: Orthopedic Surgery

## 2016-02-02 DIAGNOSIS — G959 Disease of spinal cord, unspecified: Secondary | ICD-10-CM

## 2016-02-02 DIAGNOSIS — M47812 Spondylosis without myelopathy or radiculopathy, cervical region: Secondary | ICD-10-CM | POA: Diagnosis not present

## 2016-02-09 DIAGNOSIS — M4692 Unspecified inflammatory spondylopathy, cervical region: Secondary | ICD-10-CM | POA: Diagnosis not present

## 2016-02-09 DIAGNOSIS — M4802 Spinal stenosis, cervical region: Secondary | ICD-10-CM | POA: Diagnosis not present

## 2016-03-05 DIAGNOSIS — J302 Other seasonal allergic rhinitis: Secondary | ICD-10-CM | POA: Diagnosis not present

## 2016-03-05 DIAGNOSIS — J329 Chronic sinusitis, unspecified: Secondary | ICD-10-CM | POA: Diagnosis not present

## 2016-03-13 DIAGNOSIS — F329 Major depressive disorder, single episode, unspecified: Secondary | ICD-10-CM | POA: Diagnosis not present

## 2016-03-13 DIAGNOSIS — F411 Generalized anxiety disorder: Secondary | ICD-10-CM | POA: Diagnosis not present

## 2016-04-18 ENCOUNTER — Ambulatory Visit: Payer: Self-pay | Admitting: Pulmonary Disease

## 2016-05-01 ENCOUNTER — Other Ambulatory Visit: Payer: Self-pay | Admitting: Internal Medicine

## 2016-05-09 ENCOUNTER — Telehealth: Payer: Self-pay | Admitting: Pulmonary Disease

## 2016-05-09 DIAGNOSIS — G4733 Obstructive sleep apnea (adult) (pediatric): Secondary | ICD-10-CM

## 2016-05-09 DIAGNOSIS — Z9989 Dependence on other enabling machines and devices: Principal | ICD-10-CM

## 2016-05-09 NOTE — Telephone Encounter (Signed)
Spoke with the pt  She states that she is needing new CPAP supplies  I advised will send order to [facility], but for her to be sure and keep her next appt since overdue for f/u  She verbalized understanding and nothing further needed

## 2016-05-11 ENCOUNTER — Emergency Department (HOSPITAL_COMMUNITY): Payer: Commercial Managed Care - HMO

## 2016-05-11 ENCOUNTER — Emergency Department (HOSPITAL_COMMUNITY)
Admission: EM | Admit: 2016-05-11 | Discharge: 2016-05-12 | Disposition: A | Discharge status: Home / Self Care | Payer: Commercial Managed Care - HMO | Attending: Emergency Medicine | Admitting: Emergency Medicine

## 2016-05-11 ENCOUNTER — Encounter (HOSPITAL_COMMUNITY): Payer: Self-pay | Admitting: Emergency Medicine

## 2016-05-11 DIAGNOSIS — F329 Major depressive disorder, single episode, unspecified: Secondary | ICD-10-CM | POA: Insufficient documentation

## 2016-05-11 DIAGNOSIS — Z87891 Personal history of nicotine dependence: Secondary | ICD-10-CM | POA: Diagnosis not present

## 2016-05-11 DIAGNOSIS — R1012 Left upper quadrant pain: Secondary | ICD-10-CM | POA: Diagnosis not present

## 2016-05-11 DIAGNOSIS — M170 Bilateral primary osteoarthritis of knee: Secondary | ICD-10-CM | POA: Diagnosis not present

## 2016-05-11 DIAGNOSIS — R079 Chest pain, unspecified: Secondary | ICD-10-CM | POA: Diagnosis not present

## 2016-05-11 DIAGNOSIS — M160 Bilateral primary osteoarthritis of hip: Secondary | ICD-10-CM | POA: Diagnosis not present

## 2016-05-11 DIAGNOSIS — Z90710 Acquired absence of both cervix and uterus: Secondary | ICD-10-CM | POA: Insufficient documentation

## 2016-05-11 DIAGNOSIS — K5730 Diverticulosis of large intestine without perforation or abscess without bleeding: Secondary | ICD-10-CM | POA: Diagnosis not present

## 2016-05-11 DIAGNOSIS — Z791 Long term (current) use of non-steroidal anti-inflammatories (NSAID): Secondary | ICD-10-CM | POA: Insufficient documentation

## 2016-05-11 DIAGNOSIS — E785 Hyperlipidemia, unspecified: Secondary | ICD-10-CM | POA: Insufficient documentation

## 2016-05-11 DIAGNOSIS — Z79899 Other long term (current) drug therapy: Secondary | ICD-10-CM | POA: Diagnosis not present

## 2016-05-11 DIAGNOSIS — R0602 Shortness of breath: Secondary | ICD-10-CM | POA: Diagnosis not present

## 2016-05-11 LAB — COMPREHENSIVE METABOLIC PANEL
ALBUMIN: 4.6 g/dL (ref 3.5–5.0)
ALK PHOS: 77 U/L (ref 38–126)
ALT: 42 U/L (ref 14–54)
AST: 32 U/L (ref 15–41)
Anion gap: 8 (ref 5–15)
BUN: 14 mg/dL (ref 6–20)
CALCIUM: 9.7 mg/dL (ref 8.9–10.3)
CHLORIDE: 107 mmol/L (ref 101–111)
CO2: 25 mmol/L (ref 22–32)
CREATININE: 0.79 mg/dL (ref 0.44–1.00)
GFR calc Af Amer: >60 mL/min (ref >60)
GFR calc non Af Amer: >60 mL/min (ref >60)
GLUCOSE: 116 mg/dL — AB (ref 65–99)
Potassium: 3.8 mmol/L (ref 3.5–5.1)
SODIUM: 140 mmol/L (ref 135–145)
Total Bilirubin: 0.9 mg/dL (ref 0.3–1.2)
Total Protein: 7.7 g/dL (ref 6.5–8.1)

## 2016-05-11 LAB — CBC
HCT: 36.8 % (ref 36.0–46.0)
Hemoglobin: 12.5 g/dL (ref 12.0–15.0)
MCH: 29 pg (ref 26.0–34.0)
MCHC: 34 g/dL (ref 30.0–36.0)
MCV: 85.4 fL (ref 78.0–100.0)
PLATELETS: 334 10*3/uL (ref 150–400)
RBC: 4.31 MIL/uL (ref 3.87–5.11)
RDW: 12.2 % (ref 11.5–15.5)
WBC: 11.2 10*3/uL — ABNORMAL HIGH (ref 4.0–10.5)

## 2016-05-11 LAB — I-STAT TROPONIN, ED: TROPONIN I, POC: 0 ng/mL (ref 0.00–0.08)

## 2016-05-11 LAB — LIPASE, BLOOD: LIPASE: 21 U/L (ref 11–51)

## 2016-05-11 MED ORDER — MORPHINE SULFATE (PF) 4 MG/ML IV SOLN
4.0000 mg | Freq: Once | INTRAVENOUS | Status: AC
  Administered 2016-05-11: 4 mg via INTRAVENOUS
  Filled 2016-05-11: qty 1

## 2016-05-11 MED ORDER — ONDANSETRON HCL 4 MG/2ML IJ SOLN
4.0000 mg | Freq: Once | INTRAMUSCULAR | Status: AC
  Administered 2016-05-11: 4 mg via INTRAVENOUS
  Filled 2016-05-11: qty 2

## 2016-05-11 MED ORDER — SODIUM CHLORIDE 0.9 % IV BOLUS (SEPSIS)
1000.0000 mL | Freq: Once | INTRAVENOUS | Status: AC
  Administered 2016-05-11: 1000 mL via INTRAVENOUS

## 2016-05-11 NOTE — ED Notes (Addendum)
Delay in blood draw due to pt in X-Ray.

## 2016-05-11 NOTE — ED Notes (Signed)
Pt aware of need for urine sample. Pt unable to provide one at this time.

## 2016-05-11 NOTE — ED Provider Notes (Signed)
CSN: [id number]     Arrival date & time 05/11/16  2125 History  By signing my name below, I, Irene Pap, attest that this documentation has been prepared under the direction and in the presence of Everlene Balls, MD. Electronically Signed: Irene Pap, ED Scribe. 05/11/2016. 11:07 PM.   Chief Complaint  Patient presents with  . Abdominal Pain  . Chest Pain   The history is provided by the patient. No language interpreter was used.  HPI Comments: Paula [name] is a 55 y.o. female with a hx of GERD, IBS, and fibromyalgia who presents to the Emergency Department complaining of waxing and waning left sided upper abdominal pain onset earlier this evening. She reports worsening pain with deep breathing and states that it is mainly behind her left breast bone. Pt reports associated chest pain, loose stools and nausea. She has taken two 200 mg ibuprofen for her pain to no relief. She denies hx of similar symptoms; she states that her hx of IBS causes diffuse abdominal pain, not localized pain. She reports hx of C-section, kidney stone surgery, and hysterectomy. She denies injury, emesis, hematuria, or dysuria.   Past Medical History  Diagnosis Date  . Anxiety   . Depression   . GERD (gastroesophageal reflux disease)   . Hyperlipidemia   . IBS (irritable bowel syndrome)   . OSA on CPAP     study in epic 02-01-2014  . History of hiatal hernia   . History of thyroid nodule   . History of gastric ulcer   . Intermittent vertigo   . RLS (restless legs syndrome)   . Diverticulosis of colon   . Psychogenic tremor     intermittant head tremor--  stress related  . Intermittent palpitations     stress related--  normal stress test   . Osteoarthritis     hips , knees , left side leg weakness and pain  . Migraine     daily-- ?stress  . Fibromyalgia   . Heart murmur     per pt murmur but does state has been heard by doctors in awhile//  note from cardiologist dr hochrein no murmur heard  .  Refusal of blood transfusions as patient is Jehovah's Witness    Past Surgical History  Procedure Laterality Date  . Cesarean section  x2  . Anterior cervical decomp/discectomy fusion  02-10-2004    C4 -- C7  . Laparoscopic cholecystectomy  10-07-2000  . Abdominal hysterectomy  1990  . Umbilical hernia repair  age 29  . Cardiovascular stress test  10-06-2014  [facility]    Lexiscan no exercise study;  normal study,  no ischemia or scar,  normal LV function and wall motion, ef 67%  . Cystoscopy with ureteroscopy and stent placement Right 06/07/2015    Procedure: RIGHT URETEROSCOPY, LASER LITHOTRIPSY, STONE REMOVAL AND RIGHT URETERAL STENT PLACEMENT;  Surgeon: Ardis Hughs, MD;  Location: [facility];  Service: Urology;  Laterality: Right;  . Holmium laser application N/A [id number]    Procedure: HOLMIUM LASER APPLICATION;  Surgeon: Ardis Hughs, MD;  Location: [facility];  Service: Urology;  Laterality: N/A;  . Cystoscopy w/ retrogrades Right 06/07/2015    Procedure: CYSTOSCOPY WITH RETROGRADE PYELOGRAM;  Surgeon: Ardis Hughs, MD;  Location: [facility];  Service: Urology;  Laterality: Right;   Family History  Problem Relation Age of Onset  . Colon cancer Maternal Grandfather   . Hypertension Maternal Grandfather   . Cancer  Maternal Grandfather     lung  . Hyperlipidemia Mother   . Hypertension Mother   . Hypertension Maternal Grandmother   . Hypertension Brother   . Hypertension Sister   . Cancer Maternal Aunt     lung   Social History  Substance Use Topics  . Smoking status: Former Smoker -- 0.50 packs/day for 25 years    Types: Cigarettes    Quit date: 07/24/2008  . Smokeless tobacco: Never Used  . Alcohol Use: No   OB History    Gravida Para Term Preterm AB TAB SAB Ectopic Multiple Living   3 2 2  1 1    2      Review of Systems 10 Systems reviewed and all are negative for acute change except as noted in the  HPI.  Allergies  Aspirin; Avelox; Gabapentin; Hydrocodone-acetaminophen; Imitrex; and Oxycodone  Home Medications   Prior to Admission medications   Medication Sig Start Date End Date Taking? Authorizing Provider  albuterol (PROVENTIL HFA;VENTOLIN HFA) 108 (90 BASE) MCG/ACT inhaler 2 puffs every 4 - 6 hours as needed for cough, wheezing 10/10/15   Chesley Mires, MD  cyclobenzaprine (FLEXERIL) 10 MG tablet Take 1 tablet (10 mg total) by mouth 3 (three) times daily as needed for muscle spasms. 01/05/16   Jola Schmidt, MD  omeprazole (PRILOSEC) 20 MG capsule Take 20 mg by mouth daily.    Historical Provider, MD  oxyCODONE-acetaminophen (PERCOCET/ROXICET) 5-325 MG tablet Take 1 tablet by mouth every 4 (four) hours as needed for severe pain. 01/05/16   Jola Schmidt, MD   BP 163/98 mmHg  Pulse 68  Temp(Src) 98.9 F (37.2 C)  Resp 20  Ht 5\' 5"  (1.651 m)  Wt 250 lb (113.399 kg)  BMI 41.60 kg/m2  SpO2 100% Physical Exam  Constitutional: She is oriented to person, place, and time. She appears well-developed and well-nourished. No distress.  HENT:  Head: Normocephalic and atraumatic.  Nose: Nose normal.  Mouth/Throat: Oropharynx is clear and moist. No oropharyngeal exudate.  Eyes: Conjunctivae and EOM are normal. Pupils are equal, round, and reactive to light. No scleral icterus.  Neck: Normal range of motion. Neck supple. No JVD present. No tracheal deviation present. No thyromegaly present.  Cardiovascular: Normal rate, regular rhythm and normal heart sounds.  Exam reveals no gallop and no friction rub.   No murmur heard. Pulmonary/Chest: Effort normal and breath sounds normal. No respiratory distress. She has no wheezes. She exhibits no tenderness.  Abdominal: Soft. Bowel sounds are normal. She exhibits no distension and no mass. There is tenderness in the left upper quadrant. There is no rebound and no guarding.  Musculoskeletal: Normal range of motion. She exhibits no edema or tenderness.   Lymphadenopathy:    She has no cervical adenopathy.  Neurological: She is alert and oriented to person, place, and time. No cranial nerve deficit. She exhibits normal muscle tone.  Skin: Skin is warm and dry. No rash noted. No erythema. No pallor.  Nursing note and vitals reviewed.   ED Course  Procedures (including critical care time) DIAGNOSTIC STUDIES: Oxygen Saturation is 100% on RA, normal by my interpretation.    COORDINATION OF CARE: 11:07 PM-Discussed treatment plan which includes labs, chest x-ray and CT scan with pt at bedside and pt agreed to plan.    Labs Review Labs Reviewed  COMPREHENSIVE METABOLIC PANEL - Abnormal; Notable for the following:    Glucose, Bld 116 (*)    All other components within normal limits  CBC -  Abnormal; Notable for the following:    WBC 11.2 (*)    All other components within normal limits  LIPASE, BLOOD  URINALYSIS, ROUTINE W REFLEX MICROSCOPIC (NOT AT [facility])  Randolm Idol, ED    Imaging Review Dg Chest 2 View  05/11/2016  CLINICAL DATA:  LEFT chest pain, shortness of breath and nausea for 3 hours. Remote history of smoking. EXAM: CHEST  2 VIEW COMPARISON:  Chest radiograph October 03, 2015 FINDINGS: Cardiomediastinal silhouette is normal. The lungs are clear without pleural effusions or focal consolidations. Trachea projects midline and there is no pneumothorax. Soft tissue planes and included osseous structures are non-suspicious. ACDF and corpectomy. Surgical clips in the included right abdomen compatible with cholecystectomy. IMPRESSION: No acute cardiopulmonary process. Electronically Signed   By: Elon Alas M.D.   On: 05/11/2016 22:49   Ct Abdomen Pelvis W Contrast  05/12/2016  CLINICAL DATA:  55 year old female with left upper quadrant abdominal pain EXAM: CT ABDOMEN AND PELVIS WITH CONTRAST TECHNIQUE: Multidetector CT imaging of the abdomen and pelvis was performed using the standard protocol following bolus administration  of intravenous contrast. CONTRAST:  192mL ISOVUE-300 IOPAMIDOL (ISOVUE-300) INJECTION 61% COMPARISON:  CT dated 06/05/2015 FINDINGS: The visualized lung bases are clear. No intra-abdominal free air or free fluid. Diffuse fatty infiltration of the liver. Cholecystectomy. The pancreas, spleen, adrenal glands, kidneys, visualized ureters, and urinary bladder appear unremarkable. Hysterectomy. Small hiatal hernia. There are scattered colonic diverticula without active inflammatory changes. There is no evidence of bowel obstruction for active inflammation. Normal appendix. Cysts there is aortoiliac atherosclerotic disease. The origins of the celiac axis, SMA, IMA as well as the origins of the renal arteries are patent. No portal venous gas identified. There is no adenopathy. There is a midline vertical anterior abdominal wall incisional scar. There is a small fat containing umbilical hernia. The abdominal wall soft tissues are otherwise unremarkable. The osseous structures are intact. IMPRESSION: No acute intra-abdominal pelvic pathology. Fatty liver. Colonic diverticulosis. Electronically Signed   By: Anner Crete M.D.   On: 05/12/2016 00:47   I have personally reviewed and evaluated these images and lab results as part of my medical decision-making.   EKG Interpretation   Date/Time:  Saturday May 11 2016 23:24:48 EDT Ventricular Rate:  67 PR Interval:  141 QRS Duration: 89 QT Interval:  408 QTC Calculation: 431 R Axis:   17 Text Interpretation:  Sinus rhythm Abnormal R-wave progression, early  transition Nonspecific T abnrm, anterolateral leads No significant change  since last tracing Confirmed by Glynn Octave [phone]) on 05/11/2016  11:38:19 PM      MDM   Final diagnoses:  None   Patient presents to the ED for LUQ abd pain/L chest pain.  Pain is worse with deep inspiration.  Patient denies risk factors for PE or history of PE.  EKG is unchanged and troponin ordered by triage is  negative.  Will give morphine and zofran for pain control.  CT is indicated as she has focal tenderness in the RUQ.  This may simply be an exacerbation of her GERD.   Ct scan is negative for acute pathology. She was redosed with morphine as her pain came back.  Also will add sucralfate for possible GERD causing her pain.  She was advised to fu with PCP within 3 days.  She demonstrates good understanding.  She appears well and in NAD. VS remain within her normal limits and she is safe for DC.   I personally performed the  services described in this documentation, which was scribed in my presence. The recorded information has been reviewed and is accurate.     Everlene Balls, MD 05/12/16 [phone]

## 2016-05-11 NOTE — ED Notes (Addendum)
Pt from home with complaints of abdominal pain. Pt has a history of a peptic ulcer. Pt states she took 1 motrin around 2045. Pt states she had an episode of loose stool and is experiencing nausea, but has not had an episode of emesis. Pt states the pain was of sudden onset and the pain stretches over her left shoulder and down to her abdomen

## 2016-05-12 ENCOUNTER — Emergency Department (HOSPITAL_COMMUNITY): Payer: Commercial Managed Care - HMO

## 2016-05-12 DIAGNOSIS — K5730 Diverticulosis of large intestine without perforation or abscess without bleeding: Secondary | ICD-10-CM | POA: Diagnosis not present

## 2016-05-12 LAB — URINALYSIS, ROUTINE W REFLEX MICROSCOPIC
BILIRUBIN URINE: NEGATIVE
Glucose, UA: NEGATIVE mg/dL
Hgb urine dipstick: NEGATIVE
Ketones, ur: NEGATIVE mg/dL
Leukocytes, UA: NEGATIVE
NITRITE: NEGATIVE
Protein, ur: NEGATIVE mg/dL
SPECIFIC GRAVITY, URINE: 1.013 (ref 1.005–1.030)
pH: 6.5 (ref 5.0–8.0)

## 2016-05-12 MED ORDER — IOPAMIDOL (ISOVUE-300) INJECTION 61%
100.0000 mL | Freq: Once | INTRAVENOUS | Status: AC | PRN
  Administered 2016-05-12: 100 mL via INTRAVENOUS

## 2016-05-12 MED ORDER — SUCRALFATE 1 GM/10ML PO SUSP
1.0000 g | Freq: Once | ORAL | Status: AC
  Administered 2016-05-12: 1 g via ORAL
  Filled 2016-05-12: qty 10

## 2016-05-12 MED ORDER — MORPHINE SULFATE (PF) 4 MG/ML IV SOLN
4.0000 mg | Freq: Once | INTRAVENOUS | Status: AC
  Administered 2016-05-12: 4 mg via INTRAVENOUS
  Filled 2016-05-12: qty 1

## 2016-05-12 NOTE — Discharge Instructions (Signed)
Abdominal Pain, Adult Paula [name], your CT scan did not show any cause of your pain.  Please see your primary care doctor within 3 days for close follow up.  If any symptoms worsen, come back to the ED immediately. Thank you. Many things can cause belly (abdominal) pain. Most times, the belly pain is not dangerous. Many cases of belly pain can be watched and treated at home. HOME CARE   Do not take medicines that help you go poop (laxatives) unless told to by your doctor.  Only take medicine as told by your doctor.  Eat or drink as told by your doctor. Your doctor will tell you if you should be on a special diet. GET HELP IF:  You do not know what is causing your belly pain.  You have belly pain while you are sick to your stomach (nauseous) or have runny poop (diarrhea).  You have pain while you pee or poop.  Your belly pain wakes you up at night.  You have belly pain that gets worse or better when you eat.  You have belly pain that gets worse when you eat fatty foods.  You have a fever. GET HELP RIGHT AWAY IF:   The pain does not go away within 2 hours.  You keep throwing up (vomiting).  The pain changes and is only in the right or left part of the belly.  You have bloody or tarry looking poop. MAKE SURE YOU:   Understand these instructions.  Will watch your condition.  Will get help right away if you are not doing well or get worse.   This information is not intended to replace advice given to you by your health care provider. Make sure you discuss any questions you have with your health care provider.   Document Released: 05/06/2008 Document Revised: 12/09/2014 Document Reviewed: 07/28/2013 Elsevier Interactive Patient Education [employer].

## 2016-05-12 NOTE — ED Notes (Signed)
Pt reports return of pain. Pt rates pain 5 out of 10 and requesting pain medication.  Will notify MD.

## 2016-05-13 DIAGNOSIS — M25562 Pain in left knee: Secondary | ICD-10-CM | POA: Diagnosis not present

## 2016-05-13 DIAGNOSIS — M542 Cervicalgia: Secondary | ICD-10-CM | POA: Diagnosis not present

## 2016-05-15 DIAGNOSIS — F439 Reaction to severe stress, unspecified: Secondary | ICD-10-CM | POA: Diagnosis not present

## 2016-05-15 DIAGNOSIS — M2550 Pain in unspecified joint: Secondary | ICD-10-CM | POA: Diagnosis not present

## 2016-05-15 DIAGNOSIS — R002 Palpitations: Secondary | ICD-10-CM | POA: Diagnosis not present

## 2016-05-15 DIAGNOSIS — R0609 Other forms of dyspnea: Secondary | ICD-10-CM | POA: Diagnosis not present

## 2016-05-15 DIAGNOSIS — D170 Benign lipomatous neoplasm of skin and subcutaneous tissue of head, face and neck: Secondary | ICD-10-CM | POA: Diagnosis not present

## 2016-05-17 ENCOUNTER — Telehealth: Payer: Self-pay | Admitting: Cardiology

## 2016-05-17 NOTE — Telephone Encounter (Signed)
05/30/2016 Received faxed referral packet from [place] [facility] for upcoing appointment with Dr. Percival Spanish on 05/30/2016.  Records given to [facility].  cbr

## 2016-05-20 DIAGNOSIS — M25562 Pain in left knee: Secondary | ICD-10-CM | POA: Diagnosis not present

## 2016-05-21 DIAGNOSIS — R262 Difficulty in walking, not elsewhere classified: Secondary | ICD-10-CM | POA: Diagnosis not present

## 2016-05-21 DIAGNOSIS — M25562 Pain in left knee: Secondary | ICD-10-CM | POA: Diagnosis not present

## 2016-05-23 ENCOUNTER — Ambulatory Visit: Payer: Self-pay | Admitting: Cardiology

## 2016-05-27 ENCOUNTER — Other Ambulatory Visit: Payer: Self-pay | Admitting: General Surgery

## 2016-05-27 DIAGNOSIS — G4730 Sleep apnea, unspecified: Secondary | ICD-10-CM | POA: Diagnosis not present

## 2016-05-27 DIAGNOSIS — F329 Major depressive disorder, single episode, unspecified: Secondary | ICD-10-CM | POA: Diagnosis not present

## 2016-05-27 DIAGNOSIS — D170 Benign lipomatous neoplasm of skin and subcutaneous tissue of head, face and neck: Secondary | ICD-10-CM | POA: Diagnosis not present

## 2016-05-27 DIAGNOSIS — Z789 Other specified health status: Secondary | ICD-10-CM | POA: Diagnosis not present

## 2016-05-27 DIAGNOSIS — K219 Gastro-esophageal reflux disease without esophagitis: Secondary | ICD-10-CM | POA: Diagnosis not present

## 2016-05-27 DIAGNOSIS — F419 Anxiety disorder, unspecified: Secondary | ICD-10-CM | POA: Diagnosis not present

## 2016-05-27 DIAGNOSIS — Z6841 Body Mass Index (BMI) 40.0 and over, adult: Secondary | ICD-10-CM | POA: Diagnosis not present

## 2016-05-28 DIAGNOSIS — M25562 Pain in left knee: Secondary | ICD-10-CM | POA: Diagnosis not present

## 2016-05-28 DIAGNOSIS — R262 Difficulty in walking, not elsewhere classified: Secondary | ICD-10-CM | POA: Diagnosis not present

## 2016-05-29 ENCOUNTER — Encounter: Payer: Self-pay | Admitting: Cardiology

## 2016-05-29 NOTE — Progress Notes (Signed)
HPI The patient presents for evaluation of dyspnea and palpitations. I saw her in 2001 and in 2015.  She had some dyspnea at the last visit.  She had a negative Lexiscan Myoview.   She returns for follow up palpitations.   She continues to have palpitations.   She reports that this happens sporadically. It is at least weekly. She will feel her heart beating fast. Last for several minutes at a time. It happens spontaneously. She might get a little lightheaded. It goes away spontaneously. She can't bring it on. She has sporadic shortness of breath particularly with activities. He'll have occasional stabbing chest pain. She doesn't know whether any of this is similar to what she had in 2015 when she had a negative stress test. She's not able to bring these on. She doesn't exercise because of back and joint problems.  Allergies  Allergen Reactions  . Aspirin Other (See Comments)    Avoids--- history of gastric ulcers   . Avelox [Moxifloxacin] Itching  . Gabapentin Other (See Comments)    Wt gain, tremor  . Hydrocodone-Acetaminophen Nausea And Vomiting  . Imitrex [Sumatriptan] Other (See Comments)    Body hurts  . Oxycodone Nausea And Vomiting    Current Outpatient Prescriptions  Medication Sig Dispense Refill  . cyclobenzaprine (FLEXERIL) 10 MG tablet Take 1 tablet (10 mg total) by mouth 3 (three) times daily as needed for muscle spasms. 12 tablet 0  . hydroxypropyl methylcellulose / hypromellose (ISOPTO TEARS / GONIOVISC) 2.5 % ophthalmic solution Place 1 drop into both eyes 3 (three) times daily as needed for dry eyes.    . Multiple Vitamins-Minerals (MULTIVITAMIN GUMMIES ADULT) CHEW Chew 1 tablet by mouth daily.    [name] omeprazole (PRILOSEC) 20 MG capsule Take 20 mg by mouth daily.    [name] oxyCODONE-acetaminophen (PERCOCET/ROXICET) 5-325 MG tablet Take 1 tablet by mouth every 4 (four) hours as needed for severe pain. 12 tablet 0  . propranolol (INDERAL) 10 MG tablet Take 1 tablet (10 mg total) by  mouth 3 (three) times daily as needed. 30 tablet 6   No current facility-administered medications for this visit.    Past Medical History  Diagnosis Date  . Anxiety   . Depression   . GERD (gastroesophageal reflux disease)   . Hyperlipidemia   . IBS (irritable bowel syndrome)   . OSA on CPAP     study in epic 02-01-2014  . History of hiatal hernia   . History of thyroid nodule   . History of gastric ulcer   . Intermittent vertigo   . RLS (restless legs syndrome)   . Diverticulosis of colon   . Psychogenic tremor     intermittant head tremor--  stress related  . Intermittent palpitations     stress related--  normal stress test   . Osteoarthritis     hips , knees , left side leg weakness and pain  . Migraine     daily-- ?stress  . Fibromyalgia   . Refusal of blood transfusions as patient is Jehovah's Witness     Past Surgical History  Procedure Laterality Date  . Cesarean section  x2  . Anterior cervical decomp/discectomy fusion  02-10-2004    C4 -- C7  . Laparoscopic cholecystectomy  10-07-2000  . Abdominal hysterectomy  1990  . Umbilical hernia repair  age 55  . Cardiovascular stress test  10-06-2014  [facility]    Lexiscan no exercise study;  normal study,  no ischemia or scar,  normal LV function and wall motion, ef 67%  . Cystoscopy with ureteroscopy and stent placement Right 06/07/2015    Procedure: RIGHT URETEROSCOPY, LASER LITHOTRIPSY, STONE REMOVAL AND RIGHT URETERAL STENT PLACEMENT;  Surgeon: Ardis Hughs, MD;  Location: [facility];  Service: Urology;  Laterality: Right;  . Holmium laser application N/A [id number]    Procedure: HOLMIUM LASER APPLICATION;  Surgeon: Ardis Hughs, MD;  Location: [facility];  Service: Urology;  Laterality: N/A;  . Cystoscopy w/ retrogrades Right 06/07/2015    Procedure: CYSTOSCOPY WITH RETROGRADE PYELOGRAM;  Surgeon: Ardis Hughs, MD;  Location[facility];  Service:  Urology;  Laterality: Right;     ROS:  As stated in the HPI and negative for all other systems.  PHYSICAL EXAM BP 132/78 mmHg  Pulse 69  Ht 5\' 5"  (1.651 m)  Wt 243 lb 12.8 oz (110.587 kg)  BMI 40.57 kg/m2 GENERAL:  Well appearing NECK:  No jugular venous distention, waveform within normal limits, carotid upstroke brisk and symmetric, no bruits, no thyromegaly LUNGS:  Clear to auscultation bilaterally BACK:  No CVA tenderness CHEST:  Unremarkable HEART:  PMI not displaced or sustained,S1 and S2 within normal limits, no S3, no S4, no clicks, no rubs, no murmurs ABD:  Flat, positive bowel sounds normal in frequency in pitch, no bruits, no rebound, no guarding, no midline pulsatile mass, no hepatomegaly, no splenomegaly EXT:  2 plus pulses throughout, no edema, no cyanosis no clubbing    EKG:  Sinus rhythm, rate 69, axis within normal limits, intervals within normal limits, poor anterior R wave progression, inferolateral T wave changes cannot exclude ischemia. This is unchanged compared with recent EKGs. 05/30/2016   ASSESSMENT AND PLAN   DYSPNEA:  This continues to be a complaint and I think is multifactorial. In part is related to deconditioning and weight.  The patient understands the need to lose weight with diet and exercise. We have discussed specific strategies for this.  PALPITATIONS:  I'm going to give her when necessary Inderal to treat. I did review recent labs. These were okay including a TSH. She'll let me know if these worsen.  CHEST PAIN:  She had a negative perfusion study a couple of years ago. The pretest probability of obstructive coronary disease as an etiology is low. She does have risk factors. I will bring the patient back for a POET (Plain Old Exercise Test). This will allow me to screen for obstructive coronary disease, risk stratify and very importantly provide a prescription for exercise.

## 2016-05-30 ENCOUNTER — Ambulatory Visit (INDEPENDENT_AMBULATORY_CARE_PROVIDER_SITE_OTHER): Payer: Commercial Managed Care - HMO | Admitting: Cardiology

## 2016-05-30 ENCOUNTER — Encounter: Payer: Self-pay | Admitting: Cardiology

## 2016-05-30 VITALS — BP 132/78 | HR 69 | Ht 65.0 in | Wt 243.8 lb

## 2016-05-30 DIAGNOSIS — R002 Palpitations: Secondary | ICD-10-CM

## 2016-05-30 DIAGNOSIS — R0600 Dyspnea, unspecified: Secondary | ICD-10-CM | POA: Diagnosis not present

## 2016-05-30 MED ORDER — PROPRANOLOL HCL 10 MG PO TABS: 10.0000 mg | ORAL_TABLET | Freq: Three times a day (TID) | ORAL | Status: DC | PRN

## 2016-05-30 NOTE — Patient Instructions (Signed)
Medication Instructions:  START Propranolol 10 mg three times a day as needed  Labwork: NONE  Testing/Procedures: Your physician has requested that you have an exercise tolerance test. For further information please visit [email or web address]. Please also follow instruction sheet, as given.  Follow-Up: Your physician recommends that you schedule a follow-up appointment in: As Needed   Any Other Special Instructions Will Be Listed Below (If Applicable).   If you need a refill on your cardiac medications before your next appointment, please call your pharmacy.

## 2016-06-05 ENCOUNTER — Encounter: Payer: Self-pay | Admitting: Cardiology

## 2016-06-07 ENCOUNTER — Encounter (HOSPITAL_COMMUNITY): Payer: Self-pay | Admitting: Pharmacy Technician

## 2016-06-07 NOTE — Pre-Procedure Instructions (Signed)
    Paula [name]  06/07/2016      [facility] [address] [name], [name] [address] Phone: [phone] Fax: [phone]  [facility] [place] ([address] [place] [id number] [place] ([place]) [place] [address] Phone: [phone] Fax: [phone]    Your procedure is scheduled on 06/13/16.  Report to [facility] Admitting at 645 A.M.  Call this number if you have problems the morning of surgery:  [phone]   Remember:  Do not eat food or drink liquids after midnight.  Take these medicines the morning of surgery with A SIP OF WATER --prilosec,oxycodone,inderal   Do not wear jewelry, make-up or nail polish.  Do not wear lotions, powders, or perfumes.  You may wear deoderant.  Do not shave 48 hours prior to surgery.  Men may shave face and neck.  Do not bring valuables to the hospital.  [facility] is not responsible for any belongings or valuables.  Contacts, dentures or bridgework may not be worn into surgery.  Leave your suitcase in the car.  After surgery it may be brought to your room.  For patients admitted to the hospital, discharge time will be determined by your treatment team.  Patients discharged the day of surgery will not be allowed to drive home.   Name and phone number of your driver:   Special instructions:   Do not take any aspirin,anti-inflammatories,vitamins,or herbal supplements 5-7 days prior to surgery.  Please read over the following fact sheets that you were given.

## 2016-06-10 ENCOUNTER — Encounter (HOSPITAL_COMMUNITY)
Admission: RE | Admit: 2016-06-10 | Discharge: 2016-06-10 | Disposition: A | Discharge status: Home / Self Care | Payer: Commercial Managed Care - HMO | Source: Ambulatory Visit | Attending: General Surgery | Admitting: General Surgery

## 2016-06-10 ENCOUNTER — Encounter (HOSPITAL_COMMUNITY): Payer: Self-pay

## 2016-06-10 ENCOUNTER — Telehealth: Payer: Self-pay | Admitting: Pulmonary Disease

## 2016-06-10 DIAGNOSIS — Z01812 Encounter for preprocedural laboratory examination: Secondary | ICD-10-CM | POA: Diagnosis not present

## 2016-06-10 HISTORY — DX: Diverticulitis of intestine, part unspecified, without perforation or abscess without bleeding: K57.92

## 2016-06-10 HISTORY — DX: Pneumonia, unspecified organism: J18.9

## 2016-06-10 LAB — BASIC METABOLIC PANEL
ANION GAP: 10 (ref 5–15)
BUN: 10 mg/dL (ref 6–20)
CALCIUM: 10.3 mg/dL (ref 8.9–10.3)
CO2: 25 mmol/L (ref 22–32)
Chloride: 106 mmol/L (ref 101–111)
Creatinine, Ser: 0.74 mg/dL (ref 0.44–1.00)
Glucose, Bld: 115 mg/dL — ABNORMAL HIGH (ref 65–99)
POTASSIUM: 4.7 mmol/L (ref 3.5–5.1)
SODIUM: 141 mmol/L (ref 135–145)

## 2016-06-10 LAB — CBC
HCT: 40.4 % (ref 36.0–46.0)
Hemoglobin: 12.8 g/dL (ref 12.0–15.0)
MCH: 28.4 pg (ref 26.0–34.0)
MCHC: 31.7 g/dL (ref 30.0–36.0)
MCV: 89.6 fL (ref 78.0–100.0)
PLATELETS: 335 10*3/uL (ref 150–400)
RBC: 4.51 MIL/uL (ref 3.87–5.11)
RDW: 12.5 % (ref 11.5–15.5)
WBC: 9.7 10*3/uL (ref 4.0–10.5)

## 2016-06-10 LAB — NO BLOOD PRODUCTS

## 2016-06-10 NOTE — Pre-Procedure Instructions (Signed)
Paula [name]  06/10/2016      Your procedure is scheduled on: Thursday June 13, 2016 at 8:48 AM.  Report to [facility] Admitting at 6:45 AM.  Call this number if you have problems the morning of surgery: [phone]    Remember:  Do not eat food or drink liquids after midnight.  Take these medicines the morning of surgery with A SIP OF WATER: Omeprazole (Prilosec)   Stop taking any aspirin, vitamins,or herbal supplements, NSAIDs, Ibuprofen, Advil, Motrin, Aleve, etc today.    Do not wear jewelry, make-up or nail polish.  Do not wear lotions, powders, or perfumes.   Do not shave 48 hours prior to surgery.    Do not bring valuables to the hospital.  [facility] is not responsible for any belongings or valuables.  Contacts, dentures or bridgework may not be worn into surgery.  Leave your suitcase in the car.  After surgery it may be brought to your room.  For patients admitted to the hospital, discharge time will be determined by your treatment team.  Patients discharged the day of surgery will not be allowed to drive home.   Name and phone number of your driver:   Special instructions: Shower using CHG soap the night before and the morning of your surgery  Please read over the following fact sheets that you were given.

## 2016-06-10 NOTE — Telephone Encounter (Signed)
LMTCB X1 

## 2016-06-10 NOTE — Progress Notes (Signed)
PCP is Surveyor, mining is [employer]. Patient informed Nurse that she has a irregular heart beat and she last felt it yesterday while she was sitting down watching television. Patient described it as feeling like "jabs," and stated it only lasted a few seconds. Her doctor prescribed her Propranolol (patient stated to help with anxiety) however she stated she is not going to start it until after surgery. Patient denied having any cardiac issues at this time, and stated she is scheduled to have a stress test tomorrow 06/11/16 at 1500 with Dr. Percival Spanish.   Patient informed Nurse that she developed a cold last Monday 06/03/16, but denied having any fever, chills, or cough.   Patient also informed Nurse that she has sleep apnea and wears a CPAP nightly. Patient stated she is scheduled to see respiratory at the end of the month because it is a lot of air coming out of it.  Patient informed Nurse that she has bad anxiety and develops "tremors and stutters" when she becomes anxious. This was noted while lab techs were trying to obtain blood from patient, but appeared to go away after the blood was taken.   Will send chart to Anesthesia for review.

## 2016-06-11 ENCOUNTER — Encounter (HOSPITAL_COMMUNITY): Payer: Self-pay | Admitting: Emergency Medicine

## 2016-06-11 ENCOUNTER — Telehealth: Payer: Self-pay | Admitting: Cardiology

## 2016-06-11 NOTE — Telephone Encounter (Signed)
New message       Request for surgical clearance:  What type of surgery is being performed? Lipoma excised When is this surgery scheduled? 06-13-16 1. Are there any medications that need to be held prior to surgery and how long?no---Need to know if pt needs her stress test prior to procedure or is she cleared for her procedure on thurs.  They are aware Dr Percival Spanish is out this week---can someone else answer the question and let them know today (if possible)  2. Name of physician performing surgery? Dr Dalbert Batman  3. What is your office phone and fax number? Send message in epic

## 2016-06-11 NOTE — Telephone Encounter (Signed)
Spoke with Dr Stanford Breed and patient needs to have ETT prior to making decision on surgical clearance  Advised Levada Dy

## 2016-06-11 NOTE — Progress Notes (Signed)
Anesthesia Chart Review:  Pt is a 55 year old female scheduled for excision lipoma posterior neck on 06/13/2016 with Fanny Skates, MD.   Pt is a Jehovah's Witness and refuses blood products.   PMH includes:  Palpitations, OSA, hyperlipidemia, GERD. Former smoker. BMI 40  Medications include: prilosec, prn propranolol (not currently taking).   Preoperative labs reviewed.    Chest x-ray 05/11/16 reviewed. No acute cardiopulmonary process  EKG 05/30/16: NSR. Minimal criteria for LVH, may be normal variant. T wave abnormality, consider lateral ischemia.   Nuclear stress test 10/06/14: Normal stress nuclear study. LV Wall Motion: NL LV Function; NL Wall Motion  Pt saw Minus Breeding, MD with cardiology on 05/29/16 and he ordered a stress test for her. The stress test is scheduled 06/28/16. I spoke with Rip Harbour in who spoke with Dr. Stanford Breed (cardiologist of the day) as Dr. Percival Spanish is out of the office. Pt will need to have stress test before surgery. I notified Robin in Dr. Darrel Hoover office.   Willeen Cass, FNP-BC [facility]/Anesthesiology Phone: [phone] 06/11/2016 1:57 PM

## 2016-06-11 NOTE — Telephone Encounter (Signed)
Spoke with pt, states she's been trying to get her cpap supplies X1 month.  This order was placed to [facility] over 1 month ago.   Called [facility], states that they are awaiting a signed DWO before order can be processed.  This is being refaxed to our office, verified fax number.  Will await DWO

## 2016-06-12 NOTE — Telephone Encounter (Signed)
DWO has been received by Rodena Piety, and is in his CMN folder awaiting signature upon his return to office.

## 2016-06-13 ENCOUNTER — Ambulatory Visit (HOSPITAL_COMMUNITY)
Admission: RE | Admit: 2016-06-13 | Payer: Commercial Managed Care - HMO | Source: Ambulatory Visit | Admitting: General Surgery

## 2016-06-13 ENCOUNTER — Encounter (HOSPITAL_COMMUNITY): Admission: RE | Payer: Self-pay | Source: Ambulatory Visit

## 2016-06-13 SURGERY — EXCISION LIPOMA
Anesthesia: General

## 2016-06-17 DIAGNOSIS — G4733 Obstructive sleep apnea (adult) (pediatric): Secondary | ICD-10-CM | POA: Diagnosis not present

## 2016-06-17 NOTE — Telephone Encounter (Signed)
Spoke with pt and she is very upset about not getting her cpap supplies from [facility].  She stated that she has been trying to get these since may and is now out.  She stated that she has been calling our office and [facility] and she stated that each one keeps blaming the other for her not having her supplies.  TD stated that this is being worked out today and we will inform the pt

## 2016-06-17 NOTE — Telephone Encounter (Signed)
Pt calling very upset about orders that she has been waiting on for over a month.Paula [name]

## 2016-06-19 DIAGNOSIS — G4733 Obstructive sleep apnea (adult) (pediatric): Secondary | ICD-10-CM | POA: Diagnosis not present

## 2016-06-19 NOTE — Telephone Encounter (Signed)
lmtcb x1 for pt. 

## 2016-06-21 NOTE — Telephone Encounter (Signed)
Pt called back and stated that she just got her supplies from [facility] yesterday (06-20-16) and had been out of her supplies for almost 2 months.  She is upset that they are now telling her that she has a $25 co-pay that needs to be paid.  She stated that she was going to call and discuss this with them, since she feels that they should not charge her this.

## 2016-06-21 NOTE — Telephone Encounter (Signed)
lmtcb x2 

## 2016-06-21 NOTE — Telephone Encounter (Signed)
Will forward to [place] per her request

## 2016-06-24 NOTE — Telephone Encounter (Signed)
I spoke with pt on Friday and pt stated that she did finally get her supplies.  Nothing further is needed.

## 2016-06-27 ENCOUNTER — Encounter: Payer: Self-pay | Admitting: Pulmonary Disease

## 2016-06-27 ENCOUNTER — Ambulatory Visit (INDEPENDENT_AMBULATORY_CARE_PROVIDER_SITE_OTHER): Payer: Commercial Managed Care - HMO | Admitting: Pulmonary Disease

## 2016-06-27 VITALS — BP 128/82 | HR 78 | Ht 62.0 in | Wt 247.4 lb

## 2016-06-27 DIAGNOSIS — G4733 Obstructive sleep apnea (adult) (pediatric): Secondary | ICD-10-CM | POA: Diagnosis not present

## 2016-06-27 DIAGNOSIS — Z9989 Dependence on other enabling machines and devices: Secondary | ICD-10-CM

## 2016-06-27 DIAGNOSIS — F458 Other somatoform disorders: Secondary | ICD-10-CM | POA: Diagnosis not present

## 2016-06-27 DIAGNOSIS — H5210 Myopia, unspecified eye: Secondary | ICD-10-CM | POA: Diagnosis not present

## 2016-06-27 DIAGNOSIS — H524 Presbyopia: Secondary | ICD-10-CM | POA: Diagnosis not present

## 2016-06-27 NOTE — Patient Instructions (Signed)
Referral to dentist for teeth grinding CPAP is working well-you are on auto settings There is mild leak with nasal pillows -but okay to continue on this Take reflux pill daily for 6 weeks and then as needed

## 2016-06-27 NOTE — Progress Notes (Signed)
   Subjective:    Patient ID: Paula [name], female    DOB: 04/18/1961, 55 y.o.   MRN: [id number]  HPI  55 year old female former smoker for FU of chr cough & OSA  06/27/2016  Chief Complaint  Patient presents with  . Sleep Apnea    Still having issues with apnea "attacks" waking her up at night; mask is leaking, strap is not adjustable. uses nasal pillows. DME: [facility]    Her cough has returned, dry and no sputum production-no specific trigger Occasional wheezing in the morning  Which subsided spontaneously Denies sinus drip, reports almost daily heartburn-does not take medications since she is not a "medication person"  She has been on auto CPAP and now  Has switched from a nasal mask to nasal pillows. She likes this better, reports occasional leak. Download confirms good compliance, good control of events, large leak  She reports teeth grinding Pressure is okay    Significant tests/ events  PSG 2002 and was placed on CPAP 9cm with a nasal mask PSG 01/2014  - AHI 8/h, RDI 2/h , lowest 79% Placed on autoCPAP with pillows ([facility]) >> avg pr 11 cm     PFTs 11/2015  FEV1  at 99%, ratio 92, no significant bronchodilator response, FVC 85%. DLCO 82%. Today O2 saturations 100%.. CXR was clear .  Underwent upper GI series , esophagitis , gastritis and esophageal dysmotility , +reflux . Seen by GI , Dr. Amedeo Plenty. Says she is on prilosec , takes Twice daily  (misses second dose) . Despite this she has reflux come up into her throat esp at night.  Since last visit. Patient reports she tends to have cough. Has intermittent congestion with yellow mucus    Review of Systems neg for any significant sore throat, dysphagia, itching, sneezing, nasal congestion or excess/ purulent secretions, fever, chills, sweats, unintended wt loss, pleuritic or exertional cp, hempoptysis, orthopnea pnd or change in chronic leg swelling. Also denies presyncope, palpitations, heartburn, abdominal pain,  nausea, vomiting, diarrhea or change in bowel or urinary habits, dysuria,hematuria, rash, arthralgias, visual complaints, headache, numbness weakness or ataxia.     Objective:   Physical Exam   Gen. Pleasant, obese, in no distress ENT - no lesions, no post nasal drip Neck: No JVD, no thyromegaly, no carotid bruits Lungs: no use of accessory muscles, no dullness to percussion, decreased without rales or rhonchi  Cardiovascular: Rhythm regular, heart sounds  normal, no murmurs or gallops, no peripheral edema Musculoskeletal: No deformities, no cyanosis or clubbing , no tremors        Assessment & Plan:

## 2016-06-27 NOTE — Assessment & Plan Note (Signed)
Take reflux pill daily for 6 weeks and then as needed

## 2016-06-27 NOTE — Assessment & Plan Note (Signed)
Referral to dentist for teeth grinding CPAP is working well-you are on auto settings There is mild leak with nasal pillows -but okay to continue on this   Weight loss encouraged, compliance with goal of at least 4-6 hrs every night is the expectation. Advised against medications with sedative side effects Cautioned against driving when sleepy - understanding that sleepiness will vary on a day to day basis

## 2016-06-28 ENCOUNTER — Ambulatory Visit (INDEPENDENT_AMBULATORY_CARE_PROVIDER_SITE_OTHER): Payer: Commercial Managed Care - HMO

## 2016-06-28 DIAGNOSIS — R0600 Dyspnea, unspecified: Secondary | ICD-10-CM

## 2016-06-28 DIAGNOSIS — R002 Palpitations: Secondary | ICD-10-CM | POA: Diagnosis not present

## 2016-06-28 LAB — EXERCISE TOLERANCE TEST
CHL CUP STRESS STAGE 1 GRADE: 0 %
CHL CUP STRESS STAGE 1 HR: 81 {beats}/min
CHL CUP STRESS STAGE 1 SBP: 118 mmHg
CHL CUP STRESS STAGE 1 SPEED: 0 mph
CHL CUP STRESS STAGE 2 SPEED: 0 mph
CHL CUP STRESS STAGE 3 GRADE: 0 %
CHL CUP STRESS STAGE 4 GRADE: 0 %
CHL CUP STRESS STAGE 5 SPEED: 1.7 mph
CHL CUP STRESS STAGE 6 DBP: 76 mmHg
CHL CUP STRESS STAGE 6 GRADE: 0 %
CHL CUP STRESS STAGE 6 SPEED: 0 mph
CHL CUP STRESS STAGE 7 DBP: 86 mmHg
CHL CUP STRESS STAGE 7 GRADE: 0 %
CHL CUP STRESS STAGE 7 HR: 75 {beats}/min
CHL CUP STRESS STAGE 7 SBP: 139 mmHg
CHL RATE OF PERCEIVED EXERTION: 17
CSEPED: 1 min
CSEPEDS: 47 s
CSEPHR: 66 %
Estimated workload: 4.2 METS
MPHR: 164 {beats}/min
Peak HR: 109 {beats}/min
Percent of predicted max HR: 66 %
Rest HR: 75 {beats}/min
Stage 1 DBP: 78 mmHg
Stage 2 Grade: 0 %
Stage 2 HR: 82 {beats}/min
Stage 3 HR: 82 {beats}/min
Stage 3 Speed: 1 mph
Stage 4 HR: 83 {beats}/min
Stage 4 Speed: 1 mph
Stage 5 Grade: 10 %
Stage 5 HR: 109 {beats}/min
Stage 6 HR: 100 {beats}/min
Stage 6 SBP: 118 mmHg
Stage 7 Speed: 0 mph

## 2016-07-01 ENCOUNTER — Telehealth: Payer: Self-pay | Admitting: *Deleted

## 2016-07-01 DIAGNOSIS — R002 Palpitations: Secondary | ICD-10-CM

## 2016-07-01 DIAGNOSIS — R0600 Dyspnea, unspecified: Secondary | ICD-10-CM

## 2016-07-01 NOTE — Telephone Encounter (Signed)
-----   Message from Minus Breeding, MD sent at 06/30/2016  1:02 PM EDT ----- Incomplete stress test.  She will need a Lexiscan Myoview .  Please schedule.  Call [name] with the results and send results to [facility], MD

## 2016-07-01 NOTE — Telephone Encounter (Signed)
Spoke with pt about her stress test, letting her know it was an incomplete test and Dr Percival Spanish want her to have a different test call a lexiscan myoview, pt voice understanding, lexiscan myoview was order and send to scheduler to be schedule.

## 2016-07-02 ENCOUNTER — Telehealth: Payer: Self-pay | Admitting: Cardiology

## 2016-07-02 NOTE — Telephone Encounter (Signed)
Called the patient and left a voicemail to call me to schedule her 2 day Lexiscan.

## 2016-07-03 ENCOUNTER — Telehealth (HOSPITAL_COMMUNITY): Payer: Self-pay | Admitting: Cardiology

## 2016-07-03 DIAGNOSIS — M25562 Pain in left knee: Secondary | ICD-10-CM | POA: Diagnosis not present

## 2016-07-03 NOTE — Telephone Encounter (Signed)
Close encounter 

## 2016-07-05 ENCOUNTER — Inpatient Hospital Stay (HOSPITAL_COMMUNITY): Admission: RE | Admit: 2016-07-05 | Payer: Self-pay | Source: Ambulatory Visit

## 2016-07-11 ENCOUNTER — Encounter: Payer: Self-pay | Admitting: Pulmonary Disease

## 2016-07-11 ENCOUNTER — Ambulatory Visit (HOSPITAL_COMMUNITY): Payer: Self-pay

## 2016-07-11 ENCOUNTER — Other Ambulatory Visit: Payer: Self-pay | Admitting: Family Medicine

## 2016-07-11 DIAGNOSIS — Z1231 Encounter for screening mammogram for malignant neoplasm of breast: Secondary | ICD-10-CM

## 2016-07-12 ENCOUNTER — Ambulatory Visit (HOSPITAL_COMMUNITY): Payer: Self-pay

## 2016-07-18 ENCOUNTER — Telehealth (HOSPITAL_COMMUNITY): Payer: Self-pay

## 2016-07-18 DIAGNOSIS — G441 Vascular headache, not elsewhere classified: Secondary | ICD-10-CM | POA: Diagnosis not present

## 2016-07-18 NOTE — Telephone Encounter (Signed)
Encounter complete. 

## 2016-07-19 ENCOUNTER — Telehealth (HOSPITAL_COMMUNITY): Payer: Self-pay

## 2016-07-19 ENCOUNTER — Ambulatory Visit
Admission: RE | Admit: 2016-07-19 | Discharge: 2016-07-19 | Disposition: A | Discharge status: Home / Self Care | Payer: Commercial Managed Care - HMO | Source: Ambulatory Visit | Attending: Family Medicine | Admitting: Family Medicine

## 2016-07-19 DIAGNOSIS — Z1231 Encounter for screening mammogram for malignant neoplasm of breast: Secondary | ICD-10-CM

## 2016-07-19 NOTE — Telephone Encounter (Signed)
Encounter complete. 

## 2016-07-22 ENCOUNTER — Other Ambulatory Visit: Payer: Self-pay | Admitting: Family Medicine

## 2016-07-22 DIAGNOSIS — R42 Dizziness and giddiness: Secondary | ICD-10-CM

## 2016-07-22 DIAGNOSIS — G4452 New daily persistent headache (NDPH): Secondary | ICD-10-CM

## 2016-07-23 ENCOUNTER — Ambulatory Visit (HOSPITAL_COMMUNITY)
Admission: RE | Admit: 2016-07-23 | Discharge: 2016-07-23 | Disposition: A | Discharge status: Home / Self Care | Payer: Commercial Managed Care - HMO | Source: Ambulatory Visit | Attending: Cardiology | Admitting: Cardiology

## 2016-07-23 DIAGNOSIS — I10 Essential (primary) hypertension: Secondary | ICD-10-CM | POA: Insufficient documentation

## 2016-07-23 DIAGNOSIS — R079 Chest pain, unspecified: Secondary | ICD-10-CM | POA: Diagnosis not present

## 2016-07-23 DIAGNOSIS — Z6841 Body Mass Index (BMI) 40.0 and over, adult: Secondary | ICD-10-CM | POA: Diagnosis not present

## 2016-07-23 DIAGNOSIS — R5383 Other fatigue: Secondary | ICD-10-CM | POA: Insufficient documentation

## 2016-07-23 DIAGNOSIS — R42 Dizziness and giddiness: Secondary | ICD-10-CM | POA: Diagnosis not present

## 2016-07-23 DIAGNOSIS — R0600 Dyspnea, unspecified: Secondary | ICD-10-CM | POA: Diagnosis not present

## 2016-07-23 DIAGNOSIS — R0602 Shortness of breath: Secondary | ICD-10-CM | POA: Insufficient documentation

## 2016-07-23 DIAGNOSIS — R002 Palpitations: Secondary | ICD-10-CM | POA: Diagnosis not present

## 2016-07-23 DIAGNOSIS — Z87891 Personal history of nicotine dependence: Secondary | ICD-10-CM | POA: Insufficient documentation

## 2016-07-23 DIAGNOSIS — E669 Obesity, unspecified: Secondary | ICD-10-CM | POA: Diagnosis not present

## 2016-07-23 DIAGNOSIS — R0609 Other forms of dyspnea: Secondary | ICD-10-CM | POA: Diagnosis not present

## 2016-07-23 MED ORDER — TECHNETIUM TC 99M TETROFOSMIN IV KIT
29.0000 | PACK | Freq: Once | INTRAVENOUS | Status: AC | PRN
  Administered 2016-07-23: 29 via INTRAVENOUS
  Filled 2016-07-23: qty 29

## 2016-07-23 MED ORDER — AMINOPHYLLINE 25 MG/ML IV SOLN
150.0000 mg | Freq: Once | INTRAVENOUS | Status: AC
  Administered 2016-07-23: 150 mg via INTRAVENOUS

## 2016-07-23 MED ORDER — REGADENOSON 0.4 MG/5ML IV SOLN
0.4000 mg | Freq: Once | INTRAVENOUS | Status: AC
  Administered 2016-07-23: 0.4 mg via INTRAVENOUS

## 2016-07-24 ENCOUNTER — Ambulatory Visit (HOSPITAL_COMMUNITY)
Admission: RE | Admit: 2016-07-24 | Discharge: 2016-07-24 | Disposition: A | Discharge status: Home / Self Care | Payer: Commercial Managed Care - HMO | Source: Ambulatory Visit | Attending: Cardiovascular Disease | Admitting: Cardiovascular Disease

## 2016-07-24 LAB — MYOCARDIAL PERFUSION IMAGING
CHL CUP NUCLEAR SRS: 1
CHL CUP NUCLEAR SSS: 3
CHL CUP RESTING HR STRESS: 65 {beats}/min
CSEPPHR: 96 {beats}/min
LVDIAVOL: 71 mL (ref 46–106)
LVSYSVOL: 24 mL
NUC STRESS TID: 0.77
SDS: 2

## 2016-07-24 MED ORDER — TECHNETIUM TC 99M TETROFOSMIN IV KIT
30.9000 | PACK | Freq: Once | INTRAVENOUS | Status: AC | PRN
  Administered 2016-07-24: 30.9 via INTRAVENOUS

## 2016-07-29 ENCOUNTER — Ambulatory Visit
Admission: RE | Admit: 2016-07-29 | Discharge: 2016-07-29 | Disposition: A | Discharge status: Home / Self Care | Payer: Commercial Managed Care - HMO | Source: Ambulatory Visit | Attending: Family Medicine | Admitting: Family Medicine

## 2016-07-29 DIAGNOSIS — G4452 New daily persistent headache (NDPH): Secondary | ICD-10-CM | POA: Diagnosis not present

## 2016-07-29 DIAGNOSIS — R42 Dizziness and giddiness: Secondary | ICD-10-CM

## 2016-08-09 DIAGNOSIS — S83207A Unspecified tear of unspecified meniscus, current injury, left knee, initial encounter: Secondary | ICD-10-CM | POA: Diagnosis not present

## 2016-08-20 DIAGNOSIS — M25562 Pain in left knee: Secondary | ICD-10-CM | POA: Diagnosis not present

## 2016-08-26 DIAGNOSIS — Z79899 Other long term (current) drug therapy: Secondary | ICD-10-CM | POA: Diagnosis not present

## 2016-08-26 DIAGNOSIS — M62838 Other muscle spasm: Secondary | ICD-10-CM | POA: Diagnosis not present

## 2016-08-27 DIAGNOSIS — Z79899 Other long term (current) drug therapy: Secondary | ICD-10-CM | POA: Diagnosis not present

## 2016-08-27 DIAGNOSIS — M62838 Other muscle spasm: Secondary | ICD-10-CM | POA: Diagnosis not present

## 2016-08-30 DIAGNOSIS — M25562 Pain in left knee: Secondary | ICD-10-CM | POA: Diagnosis not present

## 2016-09-25 DIAGNOSIS — G4733 Obstructive sleep apnea (adult) (pediatric): Secondary | ICD-10-CM | POA: Diagnosis not present

## 2016-10-07 ENCOUNTER — Telehealth: Payer: Self-pay | Admitting: Cardiology

## 2016-10-07 NOTE — Telephone Encounter (Signed)
This call was transferred to me directly. I spoke w patient regarding her symptoms. She identifies these as recurrent. Initially states her symptoms have returned since having stress test in August. Later clarifies that these never really went away, she's had "stabbing" sharp pain off and on for some matter of months. She also associates this with shortness of breath or a sensation of such. She denies exertional chest pain/pressure or exertional dyspnea. She voices that she is taking prilosec "only when needed".  I gave recommendation to take this medication daily as prescribed. Unclear if the pain gets better with use of this medication when taken PRN. She denies anything improves her symptoms. States the discomfort comes and goes independently of intervention.  She requested appt to be seen in office to investigate options other than myoview. Pt will see Isaac Laud on Wednesday. I recommended patient to go to ED if symptoms are persistent (longer than 15-20 mins) or worsen. She voices understanding of these recommendations.

## 2016-10-07 NOTE — Telephone Encounter (Signed)
Please call,Pt says she is still having palpitations and quick short stabbing chest pains-Just had an episode.

## 2016-10-09 ENCOUNTER — Encounter: Payer: Self-pay | Admitting: Physician Assistant

## 2016-10-09 ENCOUNTER — Encounter (INDEPENDENT_AMBULATORY_CARE_PROVIDER_SITE_OTHER): Payer: Commercial Managed Care - HMO

## 2016-10-09 ENCOUNTER — Ambulatory Visit (INDEPENDENT_AMBULATORY_CARE_PROVIDER_SITE_OTHER): Payer: Commercial Managed Care - HMO | Admitting: Physician Assistant

## 2016-10-09 VITALS — BP 137/86 | HR 68 | Ht 62.0 in | Wt 255.0 lb

## 2016-10-09 DIAGNOSIS — R0609 Other forms of dyspnea: Secondary | ICD-10-CM | POA: Diagnosis not present

## 2016-10-09 DIAGNOSIS — R002 Palpitations: Secondary | ICD-10-CM

## 2016-10-09 DIAGNOSIS — E6609 Other obesity due to excess calories: Secondary | ICD-10-CM

## 2016-10-09 DIAGNOSIS — IMO0001 Reserved for inherently not codable concepts without codable children: Secondary | ICD-10-CM

## 2016-10-09 DIAGNOSIS — R0789 Other chest pain: Secondary | ICD-10-CM

## 2016-10-09 DIAGNOSIS — E785 Hyperlipidemia, unspecified: Secondary | ICD-10-CM

## 2016-10-09 DIAGNOSIS — G4733 Obstructive sleep apnea (adult) (pediatric): Secondary | ICD-10-CM

## 2016-10-09 DIAGNOSIS — Z6841 Body Mass Index (BMI) 40.0 and over, adult: Secondary | ICD-10-CM

## 2016-10-09 DIAGNOSIS — Z9989 Dependence on other enabling machines and devices: Secondary | ICD-10-CM

## 2016-10-09 NOTE — Patient Instructions (Signed)
Medication Instructions:  Your physician recommends that you continue on your current medications as directed. Please refer to the Current Medication list given to you today.  Labwork: None   Testing/Procedures: Your physician has recommended that you wear an 30 day event monitor. Event monitors are medical devices that record the heart's electrical activity. Doctors most often [place] these monitors to diagnose arrhythmias. Arrhythmias are problems with the speed or rhythm of the heartbeat. The monitor is a small, portable device. You can wear one while you do your normal daily activities. This is usually used to diagnose what is causing palpitations/syncope (passing out).  Follow-Up: Your physician recommends that you schedule a follow-up appointment in: [place].   Any Other Special Instructions Will Be Listed Below (If Applicable).     If you need a refill on your cardiac medications before your next appointment, please call your pharmacy.

## 2016-10-09 NOTE — Progress Notes (Signed)
Cardiology Office Note    Date:  10/09/2016   ID:  SHALIE TREDWAY, DOB December 31, 1960, MRN [id number]  PCP:  Reginia Naas, MD  Cardiologist:  Dr. Percival Spanish  Chief Complaint  Patient presents with  . Follow-up    seen for Dr. Percival Spanish, recurrent chest pain    History of Present Illness:  Paula [name] is a 55 y.o. female with PMH of Fibromyalgia, GERD, hyperlipidemia, IBS, intermittent stress related palpitation and OSA on CPAP. She had dyspnea in the past and the negative Myoview. Last seen in June 2017, at which time she continued to have palpitations. She was also having some degree of chest discomfort. Exercise tolerance test was ordered and to be completed on 7/28, however she had very poor exercise tolerance and was only able to exercise for 1 minute and 47 seconds. The study was considered incomplete, she underwent Myoview on 07/24/2016 which showed EF 66%, normal stress testing without ischemia or infarction, overall low risk stress test.  She presents today for further evaluation of chest pain. She described as a pin and needle sensation in the substernal area that only last a few seconds at a time. Surprisingly, it only occur at rest and that she does not notice it when she walks around. She also have associated shortness of breath that seems to occur at the same time as the chest discomfort, however she does notice more shortness of breath with ambulation. At the same time, she also mentions she has a palpitation feeling that may or may not occurs every other day. It last several minutes at a time and tenderness of resolved. I will obtain a 30 day monitor in this case to rule out significant tachyarrhythmia that came potentially cause her palpitation. She denies any dizziness or presyncope. She has no lower extremity edema, orthopnea or PND episodes.   Past Medical History:  Diagnosis Date  . Anxiety   . Depression   . Diverticulitis   . Diverticulosis of colon   .  Fibromyalgia   . GERD (gastroesophageal reflux disease)   . History of gastric ulcer   . History of hiatal hernia   . History of thyroid nodule   . Hyperlipidemia   . IBS (irritable bowel syndrome)   . Intermittent palpitations    stress related--  normal stress test   . Intermittent vertigo   . Irregular heart beat   . Migraine    daily-- ?stress  . OSA on CPAP    wears CPAP nightly ;study in epic 02-01-2014  . Osteoarthritis    hips , knees , left side leg weakness and pain  . Pneumonia    hx of  . Psychogenic tremor    intermittant head tremor--  stress related  . Refusal of blood transfusions as patient is Jehovah's Witness   . RLS (restless legs syndrome)   . Shortness of breath dyspnea    with ambulation  . Urgency of urination     Past Surgical History:  Procedure Laterality Date  . ABDOMINAL HYSTERECTOMY  1990  . ANTERIOR CERVICAL DECOMP/DISCECTOMY FUSION  02-10-2004   C4 -- C7  . CARDIOVASCULAR STRESS TEST  10-06-2014  [facility]   Lexiscan no exercise study;  normal study,  no ischemia or scar,  normal LV function and wall motion, ef 67%  . CESAREAN SECTION  x2  . COLONOSCOPY    . CYSTOSCOPY W/ RETROGRADES Right 06/07/2015   Procedure: CYSTOSCOPY WITH RETROGRADE PYELOGRAM;  Surgeon: Ardis Hughs,  MD;  Location: [facility];  Service: Urology;  Laterality: Right;  . CYSTOSCOPY WITH URETEROSCOPY AND STENT PLACEMENT Right 06/07/2015   Procedure: RIGHT URETEROSCOPY, LASER LITHOTRIPSY, STONE REMOVAL AND RIGHT URETERAL STENT PLACEMENT;  Surgeon: Ardis Hughs, MD;  Location: [facility];  Service: Urology;  Laterality: Right;  . HOLMIUM LASER APPLICATION N/A [id number]   Procedure: HOLMIUM LASER APPLICATION;  Surgeon: Ardis Hughs, MD;  Location: [facility];  Service: Urology;  Laterality: N/A;  . KIDNEY STONE SURGERY    . LAPAROSCOPIC CHOLECYSTECTOMY  10-07-2000  . UMBILICAL HERNIA REPAIR  age 50  . UPPER GI  ENDOSCOPY      Current Medications: Outpatient Medications Prior to Visit  Medication Sig Dispense Refill  . omeprazole (PRILOSEC) 20 MG capsule Take 20 mg by mouth daily.     No facility-administered medications prior to visit.      Allergies:   Aspirin; Avelox [moxifloxacin]; Gabapentin; Hydrocodone-acetaminophen; Imitrex [sumatriptan]; and Oxycodone   Social History   Social History  . Marital status: Married    Spouse name: Juanda Crumble  . Number of children: 2  . Years of education: 11   Occupational History  . disabled    Social History Main Topics  . Smoking status: Former Smoker    Packs/day: 0.50    Years: 25.00    Types: Cigarettes    Quit date: 07/24/2008  . Smokeless tobacco: Never Used  . Alcohol use 0.0 oz/week     Comment: social  . Drug use: No  . Sexual activity: Not Asked   Other Topics Concern  . None   Social History Narrative   Married, lives at home      Caffeine use - coffee, tea once a week      Jehovah's Witness - no blood products     Family History:  The patient's family history includes Cancer in her maternal aunt and maternal grandfather; Colon cancer in her maternal grandfather; Hyperlipidemia in her mother; Hypertension in her brother, maternal grandfather, maternal grandmother, mother, and sister.   ROS:   Please see the history of present illness.    ROS All other systems reviewed and are negative.   PHYSICAL EXAM:   VS:  BP 137/86   Pulse 68   Ht 5\' 2"  (1.575 m)   Wt 255 lb (115.7 kg)   BMI 46.64 kg/m    GEN: Well nourished, well developed, in no acute distress  HEENT: normal  Neck: no JVD, carotid bruits, or masses Cardiac: RRR; no murmurs, rubs, or gallops,no edema  Respiratory:  clear to auscultation bilaterally, normal work of breathing GI: soft, nontender, nondistended, + BS MS: no deformity or atrophy  Skin: warm and dry, no rash Neuro:  Alert and Oriented x 3, Strength and sensation are intact Psych: euthymic  mood, full affect  Wt Readings from Last 3 Encounters:  10/09/16 255 lb (115.7 kg)  07/23/16 247 lb (112 kg)  07/29/16 247 lb (112 kg)      Studies/Labs Reviewed:   EKG:  EKG is ordered today.  The ekg ordered today demonstrates Normal sinus rhythm with T-wave inversion in anterolateral leads unchanged compared to the previous EKG.  Recent Labs: 05/11/2016: ALT 42 06/10/2016: BUN 10; Creatinine, Ser 0.74; Hemoglobin 12.8; Platelets 335; Potassium 4.7; Sodium 141   Lipid Panel    Component Value Date/Time   CHOL 261 (H) 01/08/2010 0943   TRIG 210.0 (H) 01/08/2010 0943   HDL 51.40 01/08/2010 [id number]  CHOLHDL 5 01/08/2010 0943   VLDL 42.0 (H) 01/08/2010 0943   LDLDIRECT 170.1 01/08/2010 0943    Additional studies/ records that were reviewed today include:   ETT 06/28/2016 Study Highlights     Blood pressure demonstrated a normal response to exercise.  There was no ST segment deviation noted during stress.  Very poor exercise tolerance-1 minute and 47 seconds, no chest pain  Nondiagnostic exercise treadmill test secondary to failure to achieve target heart rate-maximum achieved was 109 bpm, also poor exercise performance.    Myoview 07/24/2016 Study Highlights     The left ventricular ejection fraction is hyperdynamic (>65%).  Nuclear stress EF: 66%.  There was no ST segment deviation noted during stress.  The study is normal.  This is a low risk study.   Normal resting and stress perfusion. No ischemia or infarction EF 66%       ASSESSMENT:    1. Palpitations   2. Atypical chest pain   3. Exertional dyspnea   4. Class 3 obesity due to excess calories without serious comorbidity with body mass index (BMI) of 45.0 to 49.9 in adult ([place])   5. OSA on CPAP   6. Hyperlipidemia, unspecified hyperlipidemia type      PLAN:  In order of problems listed above:  1. Palpitation: Occurs up to a few minutes at a time, unclear cause, she says a female may not  occur every other day, will obtain 30 day event monitor  2. Atypical chest pain: Sharp stabbing pain occurs at rest, only last a few seconds at a time, is not associated with exertion. Does not warrant any further workup. Recent negative Myoview.  3. Exertional dyspnea: Doubt related to any coronary disease, it is more likely this is related to her morbid obesity, her BMI is 46.6. I have advised her to increase her activity  4. OSA on CPAP: She has been compliant on CPAP and use it on a daily basis faithfully.  5. Hyperlipidemia: Not on statin, despite this diagnosis, I do not see any lipid panel in the Epic system, consider fasting lipid panel will follow-up.    Medication Adjustments/Labs and Tests Ordered: Current medicines are reviewed at length with the patient today.  Concerns regarding medicines are outlined above.  Medication changes, Labs and Tests ordered today are listed in the Patient Instructions below. Patient Instructions  Medication Instructions:  Your physician recommends that you continue on your current medications as directed. Please refer to the Current Medication list given to you today.  Labwork: None   Testing/Procedures: Your physician has recommended that you wear an 30 day event monitor. Event monitors are medical devices that record the heart's electrical activity. Doctors most often [place] these monitors to diagnose arrhythmias. Arrhythmias are problems with the speed or rhythm of the heartbeat. The monitor is a small, portable device. You can wear one while you do your normal daily activities. This is usually used to diagnose what is causing palpitations/syncope (passing out).  Follow-Up: Your physician recommends that you schedule a follow-up appointment in: [place].   Any Other Special Instructions Will Be Listed Below (If Applicable).     If you need a refill on your cardiac medications before your next appointment, please call your  pharmacy.      Hilbert Corrigan, [place]  10/09/2016 10:09 PM    [facility] [place], [place], [place]  [address] Phone: [phone]; Fax: [phone]

## 2016-10-16 DIAGNOSIS — R739 Hyperglycemia, unspecified: Secondary | ICD-10-CM | POA: Diagnosis not present

## 2016-10-16 DIAGNOSIS — F439 Reaction to severe stress, unspecified: Secondary | ICD-10-CM | POA: Diagnosis not present

## 2016-10-16 DIAGNOSIS — R6889 Other general symptoms and signs: Secondary | ICD-10-CM | POA: Diagnosis not present

## 2016-10-16 DIAGNOSIS — E669 Obesity, unspecified: Secondary | ICD-10-CM | POA: Diagnosis not present

## 2016-10-22 DIAGNOSIS — N3281 Overactive bladder: Secondary | ICD-10-CM | POA: Diagnosis not present

## 2016-10-22 DIAGNOSIS — N959 Unspecified menopausal and perimenopausal disorder: Secondary | ICD-10-CM | POA: Diagnosis not present

## 2016-10-22 DIAGNOSIS — Z6841 Body Mass Index (BMI) 40.0 and over, adult: Secondary | ICD-10-CM | POA: Diagnosis not present

## 2016-10-22 DIAGNOSIS — Z01411 Encounter for gynecological examination (general) (routine) with abnormal findings: Secondary | ICD-10-CM | POA: Diagnosis not present

## 2016-10-28 DIAGNOSIS — J029 Acute pharyngitis, unspecified: Secondary | ICD-10-CM | POA: Diagnosis not present

## 2016-10-28 DIAGNOSIS — R6889 Other general symptoms and signs: Secondary | ICD-10-CM | POA: Diagnosis not present

## 2016-10-28 DIAGNOSIS — K2900 Acute gastritis without bleeding: Secondary | ICD-10-CM | POA: Diagnosis not present

## 2016-11-14 ENCOUNTER — Telehealth: Payer: Self-pay | Admitting: Cardiology

## 2016-11-14 NOTE — Telephone Encounter (Signed)
Spoke to patient . She states she has completed wear monitor for 30  Days. She has skin irration the left side of chest from wear she wore the monitor. She states she did not switch position. RN informed patient she can use hydrocortisone cream on area and use benadryl  For itching,  if symptoms persistent contact primary. Patient verbalized understanding

## 2016-11-14 NOTE — Telephone Encounter (Signed)
New message      Talk to the nurse.  Pt states that she was wearing a monitor and the patches has left her skin irritated.  What can she put on the irritation?

## 2016-11-15 ENCOUNTER — Observation Stay (HOSPITAL_COMMUNITY)
Admission: EM | Admit: 2016-11-15 | Discharge: 2016-11-17 | Disposition: A | Discharge status: Home / Self Care | Payer: Commercial Managed Care - HMO | Attending: Internal Medicine | Admitting: Internal Medicine

## 2016-11-15 ENCOUNTER — Encounter (HOSPITAL_COMMUNITY): Payer: Self-pay

## 2016-11-15 ENCOUNTER — Emergency Department (HOSPITAL_COMMUNITY): Payer: Commercial Managed Care - HMO

## 2016-11-15 DIAGNOSIS — I6523 Occlusion and stenosis of bilateral carotid arteries: Secondary | ICD-10-CM | POA: Insufficient documentation

## 2016-11-15 DIAGNOSIS — R4781 Slurred speech: Secondary | ICD-10-CM | POA: Diagnosis not present

## 2016-11-15 DIAGNOSIS — Z87442 Personal history of urinary calculi: Secondary | ICD-10-CM | POA: Insufficient documentation

## 2016-11-15 DIAGNOSIS — R51 Headache: Secondary | ICD-10-CM

## 2016-11-15 DIAGNOSIS — Z885 Allergy status to narcotic agent status: Secondary | ICD-10-CM | POA: Insufficient documentation

## 2016-11-15 DIAGNOSIS — R42 Dizziness and giddiness: Secondary | ICD-10-CM

## 2016-11-15 DIAGNOSIS — G2581 Restless legs syndrome: Secondary | ICD-10-CM | POA: Diagnosis not present

## 2016-11-15 DIAGNOSIS — R4701 Aphasia: Principal | ICD-10-CM

## 2016-11-15 DIAGNOSIS — Z8719 Personal history of other diseases of the digestive system: Secondary | ICD-10-CM | POA: Diagnosis not present

## 2016-11-15 DIAGNOSIS — Z981 Arthrodesis status: Secondary | ICD-10-CM | POA: Diagnosis not present

## 2016-11-15 DIAGNOSIS — Z888 Allergy status to other drugs, medicaments and biological substances status: Secondary | ICD-10-CM | POA: Diagnosis not present

## 2016-11-15 DIAGNOSIS — Z801 Family history of malignant neoplasm of trachea, bronchus and lung: Secondary | ICD-10-CM | POA: Diagnosis not present

## 2016-11-15 DIAGNOSIS — K589 Irritable bowel syndrome without diarrhea: Secondary | ICD-10-CM | POA: Diagnosis not present

## 2016-11-15 DIAGNOSIS — Z9989 Dependence on other enabling machines and devices: Secondary | ICD-10-CM

## 2016-11-15 DIAGNOSIS — M160 Bilateral primary osteoarthritis of hip: Secondary | ICD-10-CM | POA: Insufficient documentation

## 2016-11-15 DIAGNOSIS — G252 Other specified forms of tremor: Secondary | ICD-10-CM

## 2016-11-15 DIAGNOSIS — R079 Chest pain, unspecified: Secondary | ICD-10-CM | POA: Insufficient documentation

## 2016-11-15 DIAGNOSIS — Z90710 Acquired absence of both cervix and uterus: Secondary | ICD-10-CM | POA: Diagnosis not present

## 2016-11-15 DIAGNOSIS — F419 Anxiety disorder, unspecified: Secondary | ICD-10-CM | POA: Insufficient documentation

## 2016-11-15 DIAGNOSIS — R471 Dysarthria and anarthria: Secondary | ICD-10-CM | POA: Insufficient documentation

## 2016-11-15 DIAGNOSIS — R002 Palpitations: Secondary | ICD-10-CM | POA: Diagnosis not present

## 2016-11-15 DIAGNOSIS — G4733 Obstructive sleep apnea (adult) (pediatric): Secondary | ICD-10-CM | POA: Diagnosis not present

## 2016-11-15 DIAGNOSIS — R1310 Dysphagia, unspecified: Secondary | ICD-10-CM | POA: Insufficient documentation

## 2016-11-15 DIAGNOSIS — R251 Tremor, unspecified: Secondary | ICD-10-CM | POA: Diagnosis present

## 2016-11-15 DIAGNOSIS — R011 Cardiac murmur, unspecified: Secondary | ICD-10-CM | POA: Diagnosis not present

## 2016-11-15 DIAGNOSIS — K219 Gastro-esophageal reflux disease without esophagitis: Secondary | ICD-10-CM | POA: Insufficient documentation

## 2016-11-15 DIAGNOSIS — E7800 Pure hypercholesterolemia, unspecified: Secondary | ICD-10-CM | POA: Diagnosis not present

## 2016-11-15 DIAGNOSIS — M170 Bilateral primary osteoarthritis of knee: Secondary | ICD-10-CM | POA: Insufficient documentation

## 2016-11-15 DIAGNOSIS — Z800 Family history of malignant neoplasm of digestive organs: Secondary | ICD-10-CM | POA: Insufficient documentation

## 2016-11-15 DIAGNOSIS — M797 Fibromyalgia: Secondary | ICD-10-CM | POA: Insufficient documentation

## 2016-11-15 DIAGNOSIS — S0990XA Unspecified injury of head, initial encounter: Secondary | ICD-10-CM | POA: Diagnosis not present

## 2016-11-15 DIAGNOSIS — R519 Headache, unspecified: Secondary | ICD-10-CM | POA: Diagnosis present

## 2016-11-15 DIAGNOSIS — M542 Cervicalgia: Secondary | ICD-10-CM | POA: Diagnosis not present

## 2016-11-15 DIAGNOSIS — Z91041 Radiographic dye allergy status: Secondary | ICD-10-CM | POA: Insufficient documentation

## 2016-11-15 HISTORY — DX: Low back pain, unspecified: M54.50

## 2016-11-15 HISTORY — DX: Aphasia: R47.01

## 2016-11-15 HISTORY — DX: Unspecified urinary incontinence: R32

## 2016-11-15 HISTORY — DX: Low back pain: M54.5

## 2016-11-15 HISTORY — DX: Other chronic pain: G89.29

## 2016-11-15 HISTORY — DX: Personal history of urinary calculi: Z87.442

## 2016-11-15 HISTORY — DX: Pure hypercholesterolemia, unspecified: E78.00

## 2016-11-15 LAB — COMPREHENSIVE METABOLIC PANEL WITH GFR
ALT: 48 U/L (ref 14–54)
AST: 42 U/L — ABNORMAL HIGH (ref 15–41)
Albumin: 4.1 g/dL (ref 3.5–5.0)
Alkaline Phosphatase: 89 U/L (ref 38–126)
Anion gap: 9 (ref 5–15)
BUN: 11 mg/dL (ref 6–20)
CO2: 25 mmol/L (ref 22–32)
Calcium: 10.2 mg/dL (ref 8.9–10.3)
Chloride: 106 mmol/L (ref 101–111)
Creatinine, Ser: 0.78 mg/dL (ref 0.44–1.00)
GFR calc Af Amer: >60 mL/min (ref >60)
GFR calc non Af Amer: >60 mL/min (ref >60)
Glucose, Bld: 109 mg/dL — ABNORMAL HIGH (ref 65–99)
Potassium: 4.6 mmol/L (ref 3.5–5.1)
Sodium: 140 mmol/L (ref 135–145)
Total Bilirubin: 0.6 mg/dL (ref 0.3–1.2)
Total Protein: 7.2 g/dL (ref 6.5–8.1)

## 2016-11-15 LAB — I-STAT TROPONIN, ED: TROPONIN I, POC: 0 ng/mL (ref 0.00–0.08)

## 2016-11-15 LAB — PROTIME-INR
INR: 1
Prothrombin Time: 13.2 s (ref 11.4–15.2)

## 2016-11-15 LAB — I-STAT CHEM 8, ED
BUN: 13 mg/dL (ref 6–20)
CALCIUM ION: 1.22 mmol/L (ref 1.15–1.40)
CREATININE: 0.7 mg/dL (ref 0.44–1.00)
Chloride: 107 mmol/L (ref 101–111)
Glucose, Bld: 106 mg/dL — ABNORMAL HIGH (ref 65–99)
HCT: 39 % (ref 36.0–46.0)
HEMOGLOBIN: 13.3 g/dL (ref 12.0–15.0)
Potassium: 4.6 mmol/L (ref 3.5–5.1)
Sodium: 141 mmol/L (ref 135–145)
TCO2: 26 mmol/L (ref 0–100)

## 2016-11-15 LAB — DIFFERENTIAL
Basophils Absolute: 0 K/uL (ref 0.0–0.1)
Basophils Relative: 0 %
Eosinophils Absolute: 0.3 K/uL (ref 0.0–0.7)
Eosinophils Relative: 3 %
Lymphocytes Relative: 45 %
Lymphs Abs: 4.7 K/uL — ABNORMAL HIGH (ref 0.7–4.0)
Monocytes Absolute: 0.6 K/uL (ref 0.1–1.0)
Monocytes Relative: 6 %
Neutro Abs: 4.8 K/uL (ref 1.7–7.7)
Neutrophils Relative %: 46 %

## 2016-11-15 LAB — RAPID URINE DRUG SCREEN, HOSP PERFORMED
Amphetamines: NOT DETECTED
BARBITURATES: NOT DETECTED
Benzodiazepines: NOT DETECTED
COCAINE: NOT DETECTED
Opiates: NOT DETECTED
TETRAHYDROCANNABINOL: NOT DETECTED

## 2016-11-15 LAB — URINALYSIS, ROUTINE W REFLEX MICROSCOPIC
Bilirubin Urine: NEGATIVE
Glucose, UA: NEGATIVE mg/dL
Hgb urine dipstick: NEGATIVE
Ketones, ur: NEGATIVE mg/dL
Leukocytes, UA: NEGATIVE
Nitrite: NEGATIVE
Protein, ur: NEGATIVE mg/dL
Specific Gravity, Urine: 1.011 (ref 1.005–1.030)
pH: 7 (ref 5.0–8.0)

## 2016-11-15 LAB — CBC
HCT: 39.6 % (ref 36.0–46.0)
HEMOGLOBIN: 13 g/dL (ref 12.0–15.0)
MCH: 28.9 pg (ref 26.0–34.0)
MCHC: 32.8 g/dL (ref 30.0–36.0)
MCV: 88 fL (ref 78.0–100.0)
Platelets: 354 10*3/uL (ref 150–400)
RBC: 4.5 MIL/uL (ref 3.87–5.11)
RDW: 12.3 % (ref 11.5–15.5)
WBC: 10.4 10*3/uL (ref 4.0–10.5)

## 2016-11-15 LAB — TROPONIN I: Troponin I: <0.03 ng/mL (ref <0.03)

## 2016-11-15 LAB — TSH: TSH: 1.586 u[IU]/mL (ref 0.350–4.500)

## 2016-11-15 LAB — APTT: aPTT: 31 s (ref 24–36)

## 2016-11-15 LAB — ETHANOL: Alcohol, Ethyl (B): <5 mg/dL (ref <5)

## 2016-11-15 MED ORDER — SODIUM CHLORIDE 0.9 % IV BOLUS (SEPSIS)
250.0000 mL | Freq: Once | INTRAVENOUS | Status: AC
  Administered 2016-11-15: 250 mL via INTRAVENOUS

## 2016-11-15 MED ORDER — STROKE: EARLY STAGES OF RECOVERY BOOK
Freq: Once | Status: AC
  Administered 2016-11-15: 20:00:00
  Filled 2016-11-15: qty 1

## 2016-11-15 MED ORDER — ACETAMINOPHEN 650 MG RE SUPP: 650.0000 mg | RECTAL | Status: DC | PRN

## 2016-11-15 MED ORDER — SODIUM CHLORIDE 0.9 % IV SOLN: INTRAVENOUS | Status: DC

## 2016-11-15 MED ORDER — ACETAMINOPHEN 325 MG PO TABS
650.0000 mg | ORAL_TABLET | ORAL | Status: DC | PRN
  Administered 2016-11-16 – 2016-11-17 (×3): 650 mg via ORAL
  Filled 2016-11-15 (×3): qty 2

## 2016-11-15 MED ORDER — ASPIRIN 300 MG RE SUPP: 300.0000 mg | Freq: Every day | RECTAL | Status: DC

## 2016-11-15 MED ORDER — ASPIRIN 325 MG PO TABS
325.0000 mg | ORAL_TABLET | Freq: Every day | ORAL | Status: DC
  Administered 2016-11-15 – 2016-11-17 (×3): 325 mg via ORAL
  Filled 2016-11-15 (×3): qty 1

## 2016-11-15 MED ORDER — ACETAMINOPHEN 160 MG/5ML PO SOLN: 650.0000 mg | ORAL | Status: DC | PRN

## 2016-11-15 MED ORDER — PANTOPRAZOLE SODIUM 40 MG PO TBEC
40.0000 mg | DELAYED_RELEASE_TABLET | Freq: Two times a day (BID) | ORAL | Status: DC
  Administered 2016-11-15 – 2016-11-17 (×4): 40 mg via ORAL
  Filled 2016-11-15 (×4): qty 1

## 2016-11-15 MED ORDER — LORAZEPAM 2 MG/ML IJ SOLN
1.0000 mg | Freq: Once | INTRAMUSCULAR | Status: AC
Start: 2016-11-15 — End: 2016-11-15
  Administered 2016-11-15: 1 mg via INTRAVENOUS
  Filled 2016-11-15: qty 1

## 2016-11-15 MED ORDER — HEPARIN SODIUM (PORCINE) 5000 UNIT/ML IJ SOLN
5000.0000 [IU] | Freq: Three times a day (TID) | INTRAMUSCULAR | Status: DC
  Administered 2016-11-15 – 2016-11-17 (×5): 5000 [IU] via SUBCUTANEOUS
  Filled 2016-11-15 (×5): qty 1

## 2016-11-15 MED ORDER — SODIUM CHLORIDE 0.9 % IV SOLN
INTRAVENOUS | Status: DC
  Administered 2016-11-15: 20:00:00 via INTRAVENOUS

## 2016-11-15 NOTE — ED Notes (Signed)
Admitting at bedside 

## 2016-11-15 NOTE — ED Notes (Signed)
Patient transported to MRI 

## 2016-11-15 NOTE — Progress Notes (Signed)
Patient admitted to [phone]. Patient is alert, oriented x4, skin intact. Tele set up. Patient and husband oriented to unit and plan of care. Will continue to monitor.

## 2016-11-15 NOTE — ED Provider Notes (Addendum)
[facility] DEPT Provider Note   CSN: [id number] Arrival date & time: 11/15/16  1024     History   Chief Complaint Chief Complaint  Patient presents with  . Dizziness  . Aphasia    HPI Paula [name] is a 55 y.o. female.  Patient recently started on the new medicine Tolterodine for bladder problems. Patient took first dose on Monday awoke Tuesday morning with difficulty speaking some tremors and dizziness no true room spinning. Little bit of nausea. No fevers. No chest pain no shortness of breath no abdominal pain. Some fatigue but no motor weakness no sensory deficits. Symptoms have persisted. Patient has not taken any of the medicine since Tuesday.      Past Medical History:  Diagnosis Date  . Anxiety   . Depression   . Diverticulitis   . Diverticulosis of colon   . Fibromyalgia   . GERD (gastroesophageal reflux disease)   . History of gastric ulcer   . History of hiatal hernia   . History of thyroid nodule   . Hyperlipidemia   . IBS (irritable bowel syndrome)   . Intermittent palpitations    stress related--  normal stress test   . Intermittent vertigo   . Irregular heart beat   . Migraine    daily-- ?stress  . OSA on CPAP    wears CPAP nightly ;study in epic 02-01-2014  . Osteoarthritis    hips , knees , left side leg weakness and pain  . Pneumonia    hx of  . Psychogenic tremor    intermittant head tremor--  stress related  . Refusal of blood transfusions as patient is Jehovah's Witness   . RLS (restless legs syndrome)   . Shortness of breath dyspnea    with ambulation  . Urgency of urination     Patient Active Problem List   Diagnosis Date Noted  . Cough 11/08/2015  . GERD (gastroesophageal reflux disease) 11/08/2015  . B12 deficiency 12/07/2014  . Wart viral 11/07/2014  . Tremor 02/09/2014  . OSA on CPAP 01/28/2014  . Psychosomatic disorder 01/14/2014  . Dyspnea 01/12/2014  . Tremor of face and hands 01/12/2014  . Breast pain in female  02/09/2013  . Elevated WBC count 10/12/2012  . Abdominal pain, other specified site 10/09/2012  . Abdominal bloating 10/09/2012  . Ovarian cystic mass 05/25/2012  . Shoulder pain, right 03/11/2012  . Right arm pain 12/30/2011  . ACNE, ROSACEA 07/17/2010  . LEG PAIN 07/17/2010  . NECK PAIN 05/10/2010  . ABDOMINAL PAIN, EPIGASTRIC 05/10/2010  . BURSITIS, LEFT HIP 05/01/2010  . PLANTAR FASCIITIS, BILATERAL 05/01/2010  . HYPERGLYCEMIA 01/05/2010  . PRURITUS 11/27/2009  . TOBACCO USE, QUIT 11/27/2009  . ANXIETY 11/22/2008  . FATIGUE 11/22/2008  . Essential hypertension 10/07/2008  . Insomnia, unspecified 08/30/2008  . OBESITY, MORBID 08/19/2008  . NAUSEA ALONE 05/02/2008  . VERTIGO 01/19/2008  . DIARRHEA 01/19/2008  . THYROID NODULE 06/23/2007  . HYPERLIPIDEMIA 06/23/2007  . Situational depression 06/23/2007  . ADD 06/23/2007  . TMJ SYNDROME 06/23/2007  . GERD 06/23/2007  . Irritable bowel syndrome 06/23/2007  . HEADACHE 06/23/2007    Past Surgical History:  Procedure Laterality Date  . ABDOMINAL HYSTERECTOMY  1990  . ANTERIOR CERVICAL DECOMP/DISCECTOMY FUSION  02-10-2004   C4 -- C7  . CARDIOVASCULAR STRESS TEST  10-06-2014  [facility]   Lexiscan no exercise study;  normal study,  no ischemia or scar,  normal LV function and wall motion, ef 67%  .  CESAREAN SECTION  x2  . COLONOSCOPY    . CYSTOSCOPY W/ RETROGRADES Right 06/07/2015   Procedure: CYSTOSCOPY WITH RETROGRADE PYELOGRAM;  Surgeon: Ardis Hughs, MD;  Location: [facility];  Service: Urology;  Laterality: Right;  . CYSTOSCOPY WITH URETEROSCOPY AND STENT PLACEMENT Right 06/07/2015   Procedure: RIGHT URETEROSCOPY, LASER LITHOTRIPSY, STONE REMOVAL AND RIGHT URETERAL STENT PLACEMENT;  Surgeon: Ardis Hughs, MD;  Location: [facility];  Service: Urology;  Laterality: Right;  . HOLMIUM LASER APPLICATION N/A [id number]   Procedure: HOLMIUM LASER APPLICATION;  Surgeon: Ardis Hughs,  MD;  Location: [facility];  Service: Urology;  Laterality: N/A;  . KIDNEY STONE SURGERY    . LAPAROSCOPIC CHOLECYSTECTOMY  10-07-2000  . UMBILICAL HERNIA REPAIR  age 87  . UPPER GI ENDOSCOPY      OB History    Gravida Para Term Preterm AB Living   3 2 2   1 2    SAB TAB Ectopic Multiple Live Births     1             Home Medications    Prior to Admission medications   Medication Sig Start Date End Date Taking? Authorizing Provider  omeprazole (PRILOSEC) 20 MG capsule Take 20 mg by mouth daily.    Historical Provider, MD  traMADol (ULTRAM) 50 MG tablet Take 50 mg by mouth every 6 (six) hours as needed.    Historical Provider, MD    Family History Family History  Problem Relation Age of Onset  . Colon cancer Maternal Grandfather   . Hypertension Maternal Grandfather   . Cancer Maternal Grandfather     lung  . Hyperlipidemia Mother   . Hypertension Mother   . Cancer Maternal Aunt     lung  . Hypertension Maternal Grandmother   . Hypertension Brother   . Hypertension Sister     Social History Social History  Substance Use Topics  . Smoking status: Former Smoker    Packs/day: 0.50    Years: 25.00    Types: Cigarettes    Quit date: 07/24/2008  . Smokeless tobacco: Never Used  . Alcohol use 0.0 oz/week     Comment: social     Allergies   Aspirin; Avelox [moxifloxacin]; Gabapentin; Hydrocodone-acetaminophen; Imitrex [sumatriptan]; and Oxycodone   Review of Systems Review of Systems  Constitutional: Positive for fatigue. Negative for fever.  HENT: Negative for congestion.   Eyes: Negative for visual disturbance.  Respiratory: Negative for shortness of breath.   Cardiovascular: Negative for chest pain.  Gastrointestinal: Negative for abdominal pain.  Genitourinary: Negative for dysuria.  Musculoskeletal: Negative for back pain.  Skin: Negative for rash.  Neurological: Positive for dizziness, tremors, speech difficulty and headaches.    Hematological: Does not bruise/bleed easily.  Psychiatric/Behavioral: Negative for confusion.     Physical Exam Updated Vital Signs BP 133/78 (BP Location: Left Arm)   Pulse 84   Resp 16   Ht 5\' 5"  (075-GRM m)   Wt 115.7 kg   SpO2 96%   BMI 42.43 kg/m   Physical Exam  Constitutional: She is oriented to person, place, and time. She appears well-developed and well-nourished. She appears distressed.  HENT:  Head: Normocephalic and atraumatic.  Eyes: Conjunctivae and EOM are normal. Pupils are equal, round, and reactive to light.  Neck: Normal range of motion. Neck supple.  Cardiovascular: Normal rate, regular rhythm and normal heart sounds.   Pulmonary/Chest: Effort normal and breath sounds normal. No respiratory  distress.  Abdominal: Soft. Bowel sounds are normal. There is no tenderness.  Musculoskeletal: Normal range of motion.  Neurological: She is alert and oriented to person, place, and time. A cranial nerve deficit is present. No sensory deficit. She exhibits normal muscle tone.  Skin: Skin is warm.  Nursing note and vitals reviewed.    ED Treatments / Results  Labs (all labs ordered are listed, but only abnormal results are displayed) Labs Reviewed  DIFFERENTIAL - Abnormal; Notable for the following:       Result Value   Lymphs Abs 4.7 (*)    All other components within normal limits  COMPREHENSIVE METABOLIC PANEL - Abnormal; Notable for the following:    Glucose, Bld 109 (*)    AST 42 (*)    All other components within normal limits  I-STAT CHEM 8, ED - Abnormal; Notable for the following:    Glucose, Bld 106 (*)    All other components within normal limits  ETHANOL  PROTIME-INR  APTT  CBC  RAPID URINE DRUG SCREEN, HOSP PERFORMED  URINALYSIS, ROUTINE W REFLEX MICROSCOPIC  I-STAT TROPOININ, ED    EKG  EKG Interpretation None       Radiology Ct Head Wo Contrast  Result Date: 11/15/2016 CLINICAL DATA:  Dizziness with slurred speech and expressive  aphasia for 3 days. EXAM: CT HEAD WITHOUT CONTRAST TECHNIQUE: Contiguous axial images were obtained from the base of the skull through the vertex without intravenous contrast. COMPARISON:  MRI brain 07/29/2016.  CT head 05/22/2015. FINDINGS: Brain: There is no evidence of acute intracranial hemorrhage, mass lesion, brain edema or extra-axial fluid collection. The ventricles and subarachnoid spaces are appropriately sized for age. There is no CT evidence of acute cortical infarction. Vascular: No hyperdense vessel or unexpected calcification. Skull: Negative for fracture or focal lesion. Sinuses/Orbits: The visualized paranasal sinuses and mastoid air cells are clear. No orbital abnormalities are seen. Other: None. IMPRESSION: Stable unremarkable noncontrast head CT Electronically Signed   By: Richardean Sale M.D.   On: 11/15/2016 13:15    Procedures Procedures (including critical care time)  Medications Ordered in ED Medications  sodium chloride 0.9 % bolus 250 mL (0 mLs Intravenous Stopped 11/15/16 1422)  LORazepam (ATIVAN) injection 1 mg (1 mg Intravenous Given 11/15/16 1255)     Initial Impression / Assessment and Plan / ED Course  I have reviewed the triage vital signs and the nursing notes.  Pertinent labs & imaging results that were available during my care of the patient were reviewed by me and considered in my medical decision making (see chart for details).  Clinical Course     Patient with ongoing workup for dizziness expressive speech aphasia. Patient believes it secondary to a new bladder medicine she was started on took the first dose on Monday and symptoms were present on Tuesday. The patient hasn't taken any medicine since Tuesday. Symptoms have persisted. Head CT was negative MRI is pending. Clinically I am concerned about some sort of neurological disorder and not related to the medicine. Main symptoms are the dizziness and the expressive aphasia.  Patient's labs without any  significant abnormalities. Patient without any improvement in symptoms here. Tremors have improved some. Disposition will be based on MRI.  Final Clinical Impressions(s) / ED Diagnoses   Final diagnoses:  Aphasia  Dizziness    New Prescriptions New Prescriptions   No medications on file    Addendum: DW with hospitalist who will see but he wants to wait for MRI  before formally admitting due to hx of psychogenic tremor in past.   Neurohospitalist to consult.     Fredia Sorrow, MD 11/15/16 1559    Fredia Sorrow, MD 11/15/16 [phone]

## 2016-11-15 NOTE — ED Notes (Signed)
Attempted report nurse was in room with another patient she just received.

## 2016-11-15 NOTE — H&P (Signed)
[facility] History and Physical  Paula [name] [id number] DOB: 06/30/1961 DOA: 11/15/2016   PCP: Reginia Naas, MD  Specialists: Patient is followed by Dr. Percival Spanish for chest pain and palpitations. Patient has been seen by neurology, Dr. Carles Collet, for tremor. Also seen by Dr. Leta Baptist.   Chief Complaint: Difficulty speaking since Tuesday  HPI: Paula [name] is a 55 y.o. female with a past medical history of GERD, palpitations, previously diagnosed as having psychogenic tremors who was in her usual state of health until Tuesday when she woke up and found it difficult to speak. She was unable to express her thoughts. Her husband felt that she had slurred speech. There was no facial droop. She has been having dizziness as well. Headaches. She also complains of pain around her neck area and shoulders. She felt like she might pass out, but she did not have any syncopal episodes. No fever, no chills. Denies any weakness in any one side of the body. No falls or injuries. On Monday she was prescribed a new medications, Tolterodine, for bladder issues. She took another dose on Tuesday. Hasn't taken any of that medication since then. She blames her symptoms on that medication. And she also feels that her symptoms have been getting worse. So she decided to come into the emergency department for further evaluation.  In the ED, patient was seen by emergency physician. MRI was obtained. Neurology was consulted.  Home Medications: Prior to Admission medications   Medication Sig Start Date End Date Taking? Authorizing Provider  Estradiol (ELESTRIN) 0.52 MG/0.87 GM (0.06%) GEL Apply 1 application topically daily.   Yes Historical Provider, MD  omeprazole (PRILOSEC) 20 MG capsule Take 20 mg by mouth daily.   Yes Historical Provider, MD    Allergies:  Allergies  Allergen Reactions  . Detrol [Tolterodine] Other (See Comments)    slurred speech, tremors, dizziness  . Gabapentin Palpitations  and Other (See Comments)    Wt gain, tremor  . Aspirin Other (See Comments)    Avoids--- history of gastric ulcers   . Contrast Media [Iodinated Diagnostic Agents] Hives, Itching and Rash    CT contrast-Needed to take benadryl   . Hydrocodone-Acetaminophen Nausea And Vomiting  . Imitrex [Sumatriptan] Other (See Comments)    Body hurts  . Oxycodone Nausea And Vomiting  . Avelox [Moxifloxacin] Itching    Past Medical History: Past Medical History:  Diagnosis Date  . Anxiety   . Depression   . Diverticulitis   . Diverticulosis of colon   . Fibromyalgia   . GERD (gastroesophageal reflux disease)   . History of gastric ulcer   . History of hiatal hernia   . History of thyroid nodule   . Hyperlipidemia   . IBS (irritable bowel syndrome)   . Intermittent palpitations    stress related--  normal stress test   . Intermittent vertigo   . Irregular heart beat   . Migraine    daily-- ?stress  . OSA on CPAP    wears CPAP nightly ;study in epic 02-01-2014  . Osteoarthritis    hips , knees , left side leg weakness and pain  . Pneumonia    hx of  . Psychogenic tremor    intermittant head tremor--  stress related  . Refusal of blood transfusions as patient is Jehovah's Witness   . RLS (restless legs syndrome)   . Shortness of breath dyspnea    with ambulation  . Urgency of urination     Past Surgical  History:  Procedure Laterality Date  . ABDOMINAL HYSTERECTOMY  1990  . ANTERIOR CERVICAL DECOMP/DISCECTOMY FUSION  02-10-2004   C4 -- C7  . CARDIOVASCULAR STRESS TEST  10-06-2014  DR [facility]   Lexiscan no exercise study;  normal study,  no ischemia or scar,  normal LV function and wall motion, ef 67%  . CESAREAN SECTION  x2  . COLONOSCOPY    . CYSTOSCOPY W/ RETROGRADES Right 06/07/2015   Procedure: CYSTOSCOPY WITH RETROGRADE PYELOGRAM;  Surgeon: Ardis Hughs, MD;  Location: [facility];  Service: Urology;  Laterality: Right;  . CYSTOSCOPY WITH URETEROSCOPY  AND STENT PLACEMENT Right 06/07/2015   Procedure: RIGHT URETEROSCOPY, LASER LITHOTRIPSY, STONE REMOVAL AND RIGHT URETERAL STENT PLACEMENT;  Surgeon: Ardis Hughs, MD;  Location[facility];  Service: Urology;  Laterality: Right;  . HOLMIUM LASER APPLICATION N/A [id number]   Procedure: HOLMIUM LASER APPLICATION;  Surgeon: Ardis Hughs, MD;  Location: [facility];  Service: Urology;  Laterality: N/A;  . KIDNEY STONE SURGERY    . LAPAROSCOPIC CHOLECYSTECTOMY  10-07-2000  . UMBILICAL HERNIA REPAIR  age 51  . UPPER GI ENDOSCOPY      Social History: Patient lives in [place] with her husband. Denies any history of smoking. Very occasional alcohol use. Independent with daily activities. Denies any illicit drug use.  Family History:  Family History  Problem Relation Age of Onset  . Colon cancer Maternal Grandfather   . Hypertension Maternal Grandfather   . Cancer Maternal Grandfather     lung  . Hyperlipidemia Mother   . Hypertension Mother   . Cancer Maternal Aunt     lung  . Hypertension Maternal Grandmother   . Hypertension Brother   . Hypertension Sister      Review of Systems - History obtained from the patient General ROS: positive for  - fatigue Psychological ROS: positive for - anxiety Ophthalmic ROS: positive for - double vision ENT ROS: negative Allergy and Immunology ROS: negative Hematological and Lymphatic ROS: negative Endocrine ROS: negative Respiratory ROS: chest pain Cardiovascular ROS: as in hpi Gastrointestinal ROS: no abdominal pain, change in bowel habits, or black or bloody stools Genito-Urinary ROS: no dysuria, trouble voiding, or hematuria Musculoskeletal ROS: negative Neurological ROS: as in hpi Dermatological ROS: negative  Physical Examination  Vitals:   11/15/16 1500 11/15/16 1515 11/15/16 1537 11/15/16 1615  BP: 122/71 127/67 133/78 139/70  Pulse: 73 77 84 84  Resp: 23 (!) 27 16 (!) 27  SpO2: 95% 97% 96%  96%  Weight:      Height:        BP 139/70   Pulse 84   Resp (!) 27   Ht 5\' 5"  (1.651 m)   Wt 115.7 kg (255 lb)   SpO2 96%   BMI 42.43 kg/m   General appearance: alert, cooperative, appears stated age and no distress Head: Normocephalic, without obvious abnormality, atraumatic Eyes: conjunctivae/corneas clear. PERRL, EOM's intact.  Throat: lips, mucosa, and tongue normal; teeth and gums normal Neck: no adenopathy, no carotid bruit, no JVD, supple, symmetrical, trachea midline and thyroid not enlarged, symmetric, no tenderness/mass/nodules Resp: clear to auscultation bilaterally Cardio: regular rate and rhythm, S1, S2 normal, no murmur, click, rub or gallop GI: soft, non-tender; bowel sounds normal; no masses,  no organomegaly Extremities: extremities normal, atraumatic, no cyanosis or edema Pulses: 2+ and symmetric Skin: Skin color, texture, turgor normal. No rashes or lesions Lymph nodes: Cervical, supraclavicular, and axillary nodes normal. Neurologic:  Awake and alert. Oriented 3. Some stuttering of speech is noted. Tongue is midline. No facial asymmetry. No cranial nerve deficits. No pronator drift. Motor strength is equal bilaterally upper and lower extremity, although some degree of generalized weakness is noted.  Labs on Admission: I have personally reviewed following labs and imaging studies  CBC:  Recent Labs Lab 11/15/16 1255 11/15/16 1313  WBC 10.4  --   NEUTROABS 4.8  --   HGB 13.0 13.3  HCT 39.6 39.0  MCV 88.0  --   PLT 354  --    Basic Metabolic Panel:  Recent Labs Lab 11/15/16 1255 11/15/16 1313  NA 140 141  K 4.6 4.6  CL 106 107  CO2 25  --   GLUCOSE 109* 106*  BUN 11 13  CREATININE 0.78 0.70  CALCIUM 10.2  --    GFR: Estimated Creatinine Clearance: 99.8 mL/min (by C-G formula based on SCr of 0.7 mg/dL). Liver Function Tests:  Recent Labs Lab 11/15/16 1255  AST 42*  ALT 48  ALKPHOS 89  BILITOT 0.6  PROT 7.2  ALBUMIN 4.1    Coagulation Profile:  Recent Labs Lab 11/15/16 1255  INR 1.00   Urine analysis:    Component Value Date/Time   COLORURINE YELLOW 11/15/2016 [place] 11/15/2016 1343   LABSPEC 1.011 11/15/2016 1343   PHURINE 7.0 11/15/2016 1343   GLUCOSEU NEGATIVE 11/15/2016 1343   GLUCOSEU NEGATIVE 01/29/2013 1324   HGBUR NEGATIVE 11/15/2016 1343   BILIRUBINUR NEGATIVE 11/15/2016 1343   KETONESUR NEGATIVE 11/15/2016 1343   PROTEINUR NEGATIVE 11/15/2016 1343   UROBILINOGEN 0.2 06/05/2015 0141   NITRITE NEGATIVE 11/15/2016 1343   LEUKOCYTESUR NEGATIVE 11/15/2016 1343    Radiological Exams on Admission: Ct Head Wo Contrast  Result Date: 11/15/2016 CLINICAL DATA:  Dizziness with slurred speech and expressive aphasia for 3 days. EXAM: CT HEAD WITHOUT CONTRAST TECHNIQUE: Contiguous axial images were obtained from the base of the skull through the vertex without intravenous contrast. COMPARISON:  MRI brain 07/29/2016.  CT head 05/22/2015. FINDINGS: Brain: There is no evidence of acute intracranial hemorrhage, mass lesion, brain edema or extra-axial fluid collection. The ventricles and subarachnoid spaces are appropriately sized for age. There is no CT evidence of acute cortical infarction. Vascular: No hyperdense vessel or unexpected calcification. Skull: Negative for fracture or focal lesion. Sinuses/Orbits: The visualized paranasal sinuses and mastoid air cells are clear. No orbital abnormalities are seen. Other: None. IMPRESSION: Stable unremarkable noncontrast head CT Electronically Signed   By: Richardean Sale M.D.   On: 11/15/2016 13:15   Mr Brain Wo Contrast (neuro Protocol)  Result Date: 11/15/2016 CLINICAL DATA:  Dysphagia. Difficulty speaking with tremors and dizziness 1 day after beginning fall tearing ninth EXAM: MRI HEAD WITHOUT CONTRAST TECHNIQUE: Multiplanar, multiecho pulse sequences of the brain and surrounding structures were obtained without intravenous contrast.  COMPARISON:  CT head from the same day.  MRI brain 07/29/2016. FINDINGS: Brain: No acute infarct, hemorrhage, or mass lesion is present. The ventricles are of normal size. No significant extraaxial fluid collection is present. No significant white matter disease is present. Internal auditory canals are within normal limits bilaterally. The brainstem and cerebellum are normal. Vascular: Flow is present in the major intracranial arteries. Skull and upper cervical spine: Skullbase is within normal limits. Craniocervical junction is within normal limits. Midline intracranial sagittal structures are normal. Cervical fusion and corpectomy began at C4-5. Sinuses/Orbits: The paranasal sinuses and mastoid air cells are clear. The globes and orbits  are within normal limits. IMPRESSION: 1. Normal MRI of the brain. 2. Cervical spine surgery. Electronically Signed   By: San Morelle M.D.   On: 11/15/2016 17:08    My interpretation of Electrocardiogram: Sinus rhythm in the 80's. Normal intervals. No acute ST or t wave changes.   Problem List  Principal Problem:   Expressive aphasia Active Problems:   Headache   Tremor of face and hands   OSA on CPAP   Coarse tremors   GERD (gastroesophageal reflux disease)   Assessment: This is a 55 year old African-American female with a past medical history as stated earlier, who presents with difficulty speaking and tremors. Previous records were reviewed. She has been evaluated by at least 2 different neurologists and perhaps more. She has been diagnosed as having psychogenic tremors. She's had similar symptoms in the past.   Plan: #1 Expressive aphasia with tremors: MRI does not show any new stroke. Her symptoms sound similar to what she has had in the past. Await neurology input. [facility] consult psychiatry. Obtain carotid Dopplers and echocardiogram to complete workup. PT/OT evaluation. Swallow evaluation.  #2 history of GERD: Continue with PPI.  #3 history of  chest pain and palpitations: This is chronic for her. EKG does not show any ischemic changes. Recent cardiology note reviewed. She has had a recent negative Myoview. She recently completed a 30 day event monitor. Those results are still pending. Check TSH.  #4. History of obstructive sleep apnea: CPAP at nighttime.  DVT Prophylaxis: Subcutaneous heparin Code Status: Full code Family Communication: Discussed with the patient and her husband  Disposition Plan: Telemetry. Anticipate she will be able to return home at the time of discharge  Consults called: Neurology and psychiatry  Admission status: Observation   Further management decisions will depend on results of further testing and patient's response to treatment.   [facility]  [facility] Pager [phone]  If 7PM-7AM, please contact night-coverage [email or web address] Password [email or web address]  11/15/2016, 5:36 PM

## 2016-11-15 NOTE — ED Triage Notes (Signed)
Pt presents for evaluation of dizziness/slurred speech/expressive apashia/body aches since Tuesday. Pt. Reports started on tolterodine 2mg  Monday for overactive bladder. States she went to bed Monday PM and was normal and woke up around 0500 Tuesday with symptoms. Pt reports PCP told her to come here for possible reaction. Pt. AxO x4.

## 2016-11-16 ENCOUNTER — Observation Stay (HOSPITAL_BASED_OUTPATIENT_CLINIC_OR_DEPARTMENT_OTHER): Payer: Commercial Managed Care - HMO

## 2016-11-16 ENCOUNTER — Observation Stay (HOSPITAL_COMMUNITY): Payer: Commercial Managed Care - HMO

## 2016-11-16 ENCOUNTER — Observation Stay (HOSPITAL_COMMUNITY)
Admit: 2016-11-16 | Discharge: 2016-11-16 | Disposition: A | Discharge status: Home / Self Care | Payer: Commercial Managed Care - HMO | Attending: Internal Medicine | Admitting: Internal Medicine

## 2016-11-16 DIAGNOSIS — R4701 Aphasia: Secondary | ICD-10-CM | POA: Diagnosis not present

## 2016-11-16 DIAGNOSIS — G4733 Obstructive sleep apnea (adult) (pediatric): Secondary | ICD-10-CM | POA: Diagnosis not present

## 2016-11-16 DIAGNOSIS — Z9989 Dependence on other enabling machines and devices: Secondary | ICD-10-CM | POA: Diagnosis not present

## 2016-11-16 DIAGNOSIS — K219 Gastro-esophageal reflux disease without esophagitis: Secondary | ICD-10-CM | POA: Diagnosis not present

## 2016-11-16 DIAGNOSIS — G458 Other transient cerebral ischemic attacks and related syndromes: Secondary | ICD-10-CM | POA: Diagnosis not present

## 2016-11-16 LAB — LIPID PANEL
CHOL/HDL RATIO: 7.2 ratio
Cholesterol: 282 mg/dL — ABNORMAL HIGH (ref 0–200)
HDL: 39 mg/dL — AB (ref >40)
LDL Cholesterol: 173 mg/dL — ABNORMAL HIGH (ref 0–99)
Triglycerides: 351 mg/dL — ABNORMAL HIGH (ref <150)
VLDL: 70 mg/dL — ABNORMAL HIGH (ref 0–40)

## 2016-11-16 LAB — TROPONIN I

## 2016-11-16 LAB — ECHOCARDIOGRAM COMPLETE
Height: 65 in
WEIGHTICAEL: 4080 [oz_av]

## 2016-11-16 NOTE — Progress Notes (Signed)
Occupational Therapy Evaluation:  Pt admitted with below.  She demonstrates diplopia present only with visual pursuit testing (horizontal diplopia), complaint of feeling lightheaded, off balance intermittently.  She initially kept Rt UE dangling at her side, but when engaged used Rt UE normally.  She is able to perform ADLs mod I.  No further OT recommended at this time.  Will sign off.    11/16/16 1200  OT Visit Information  Last OT Received On 11/16/16  Assistance Needed +1  History of Present Illness 55 y.o. female admitted with dysarthria.   PMH includes:  psychogenic tremors/dysarthria 2016.  depression, diverticulitis, fibromyalgia, hyperlipidemia, IBS, intermittent palpitations, intermittent vertigo, migraine, OSA,  PNA, psychogenic tremor, RLS   Home Living  Family/patient expects to be discharged to: Private residence  Living Arrangements Spouse/significant other  Available Help at Discharge Family;Available PRN/intermittently  Type of Home House  Home Access Stairs to enter  Entrance Stairs-Number of Steps 1  Home Layout Two level;Able to live on main level with bedroom/bathroom  Barrister's clerk None  Prior Function  Level of Independence Independent  Comments pt reports intermittent dizziness PTA.  She states she walkes into walls and door frames.    Communication  Communication Other (comment) (Pt with occasional forced speech and stuttering, but not con)  Pain Assessment  Pain Assessment Faces  Faces Pain Scale 4  Pain Location chest "she rubbed me hard when she did the ultrasound"  Pain Descriptors / Indicators Grimacing  Pain Intervention(s) Monitored during session  Cognition  Arousal/Alertness Awake/alert  Behavior During Therapy WFL for tasks assessed/performed  Overall Cognitive Status Within Functional Limits for tasks assessed  Upper Extremity Assessment  Upper Extremity Assessment Overall WFL for  tasks assessed  Lower Extremity Assessment  Lower Extremity Assessment Defer to PT evaluation  Cervical / Trunk Assessment  Cervical / Trunk Assessment Normal  ADL  Overall ADL's  Modified independent  General ADL Comments Pt moves slowly and guarded.    Vision- History  Baseline Vision/History Wears glasses  Wears Glasses Reading only  Patient Visual Report Diplopia;Blurring of vision  Vision- Assessment  Vision Assessment? Yes  Eye Alignment WFL  Ocular Range of Motion [facility]  Alignment/Gaze Preference WDL  Tracking/Visual Pursuits Able to track stimulus in all quads without difficulty  Diplopia Assessment Other (comment);Objects split side to side (only present with visual pursuit testing )  Additional Comments Pt reports horizontal diplopia with pursuit tesing and blurriness at other times.  She states diplopia disappears with one eye closed, but vision is blurry.  Vision appears to be conjugate   Perception  Perception Tested? Yes  Praxis  Praxis tested? WFL  Bed Mobility  Overal bed mobility Independent  Transfers  Overall transfer level Modified independent  General transfer comment moves slowly   Balance  Overall balance assessment No apparent balance deficits (not formally assessed)  General Comments  General comments (skin integrity, edema, etc.) Pt reports symptoms come and go. Pt moves in guarded fashion unless she is distracted.  At times speech is halting and forced with stuttering present, then when laughing will talk smoothly.    OT - End of Session  Activity Tolerance Patient tolerated treatment well  Patient left in chair;with call bell/phone within reach;with family/visitor present  OT Assessment  OT Recommendation/Assessment Patient does not need any further OT services  OT Problem List Decreased strength;Obesity  OT Recommendation  Follow Up Recommendations Other (comment) (neuropsych evaluation per neuro recommendation )  OT Equipment None recommended by  OT  Acute Rehab OT Goals  Patient Stated Goal to get better   OT Goal Formulation All assessment and education complete, DC therapy  OT Time Calculation  OT Start Time (ACUTE ONLY) 1150  OT Stop Time (ACUTE ONLY) 1210  OT Time Calculation (min) 20 min  OT G-codes **NOT FOR INPATIENT CLASS**  Functional Limitation Self care  Self Care Current Status [id number]) CI  Self Care Goal Status [id number]) CI  OT General Charges  $OT Visit 1 Procedure  OT Evaluation  $OT Eval Moderate Complexity 1 Procedure  Written Expression  Dominant Hand Right  Lucille Passy, OTR/L [phone]

## 2016-11-16 NOTE — Progress Notes (Signed)
VASCULAR LAB PRELIMINARY  PRELIMINARY  PRELIMINARY  PRELIMINARY  Carotid duplex completed.    Preliminary report:  1-39% ICA plaquing.  Vertebral artery flow is antegrade.   Piedad Standiford, RVT 11/16/2016, 10:34 AM

## 2016-11-16 NOTE — Evaluation (Signed)
Physical Therapy Evaluation Patient Details Name: Paula [name] MRN: [id number] DOB: 1961-02-20 Today's Date: 11/16/2016   History of Present Illness  55 y.o. female admitted with dysarthria.   PMH includes:  psychogenic tremors/dysarthria 2016.  depression, diverticulitis, fibromyalgia, hyperlipidemia, IBS, intermittent palpitations, intermittent vertigo, migraine, OSA,  PNA, psychogenic tremor, RLS     Clinical Impression  Patient is functioning at supervision level with mobility and gait.  Is moving around room independently.  Was able to ambulate 160' with no assistive device and supervision. No loss of balance during gait.  Patient's symptoms are expressive aphasia, dizziness, and tremors, which appear to be inconsistent (per patient, they come and go).  Patient with symmetrical strength of LE's.  Patient with no acute PT needs at this time.  PT will sign off.    Follow Up Recommendations No PT follow up;Supervision - Intermittent    Equipment Recommendations  None recommended by PT    Recommendations for Other Services Other (comment) (neuropsych evaluation per neuro recommendation)     Precautions / Restrictions Precautions Precautions: None Precaution Comments: Scored >13 on Fall Risk score per nsg Restrictions Weight Bearing Restrictions: No      Mobility  Bed Mobility Overal bed mobility: Independent                Transfers Overall transfer level: Modified independent               General transfer comment: Increased time  Ambulation/Gait Ambulation/Gait assistance: Supervision Ambulation Distance (Feet): 160 Feet Assistive device: None Gait Pattern/deviations: Step-through pattern;Decreased stride length Gait velocity: slightly decreased Gait velocity interpretation: Below normal speed for age/gender General Gait Details: Patient with slow, guarded gait pattern, occasionally reaching for rail in hallway.  Initially patient with Rt hand on  sternum, reporting pain.  Encouraged patient to relax arm to walk in a more normal gait pattern.   Patient with no loss of balance during gait.    Stairs            Wheelchair Mobility    Modified Rankin (Stroke Patients Only) Modified Rankin (Stroke Patients Only) Pre-Morbid Rankin Score: No significant disability Modified Rankin: Slight disability     Balance Overall balance assessment: No apparent balance deficits (not formally assessed)                           High level balance activites: Direction changes;Turns;Sudden stops;Head turns High Level Balance Comments: No loss of balance during these high level balance activities.             Pertinent Vitals/Pain Pain Assessment: 0-10 Pain Score: 8  Pain Location: Head and chest Pain Descriptors / Indicators: Headache;Grimacing;Guarding;Sore Pain Intervention(s): Monitored during session;Repositioned;Premedicated before session    Dawn expects to be discharged to:: Private residence Living Arrangements: Spouse/significant other Available Help at Discharge: Family;Available PRN/intermittently (Husband reports he is available most of the time) Type of Home: House Home Access: Stairs to enter Entrance Stairs-Rails: None Entrance Stairs-Number of Steps: 1 Home Layout: Two level;Able to live on main level with bedroom/bathroom Home Equipment: Kasandra Knudsen - single point;Walker - 2 wheels      Prior Function Level of Independence: Independent               Hand Dominance   Dominant Hand: Right    Extremity/Trunk Assessment   Upper Extremity Assessment Upper Extremity Assessment: Defer to OT evaluation    Lower Extremity Assessment Lower Extremity Assessment:  Overall [facility] for tasks assessed (Strength tested symmetrical)    Cervical / Trunk Assessment Cervical / Trunk Assessment: Other exceptions Cervical / Trunk Exceptions: Patient with tremor in neck/head, turning head  side to side.  Noted to be inconsistent - will stop when distracted or completing another task.  Communication   Communication: Other (comment) (Intermittent forced speech/stuttering. Fluctuating.)  Cognition Arousal/Alertness: Awake/alert Behavior During Therapy: WFL for tasks assessed/performed Overall Cognitive Status: Within Functional Limits for tasks assessed                      General Comments General comments (skin integrity, edema, etc.): Patient reports her symptoms "come and go" including her speech and strength.    Exercises     Assessment/Plan    PT Assessment Patent does not need any further PT services  PT Problem List            PT Treatment Interventions      PT Goals (Current goals can be found in the Care Plan section)  Acute Rehab PT Goals PT Goal Formulation: All assessment and education complete, DC therapy    Frequency     Barriers to discharge        Co-evaluation               End of Session Equipment Utilized During Treatment: Gait belt Activity Tolerance: Patient tolerated treatment well;Patient limited by fatigue (Slight DOE during gait) Patient left: in bed;with call bell/phone within reach;with family/visitor present (sitting EOB) Nurse Communication: Mobility status    Functional Assessment Tool Used: Clinical judgement Functional Limitation: Mobility: Walking and moving around Mobility: Walking and Moving Around Current Status [id number]): At least 1 percent but less than 20 percent impaired, limited or restricted Mobility: Walking and Moving Around Goal Status [phone]): At least 1 percent but less than 20 percent impaired, limited or restricted Mobility: Walking and Moving Around Discharge Status [phone]): At least 1 percent but less than 20 percent impaired, limited or restricted    Time: 1724-1744 PT Time Calculation (min) (ACUTE ONLY): 20 min   Charges:   PT Evaluation $PT Eval Moderate Complexity: 1 Procedure      PT G Codes:   PT G-Codes **NOT FOR INPATIENT CLASS** Functional Assessment Tool Used: Clinical judgement Functional Limitation: Mobility: Walking and moving around Mobility: Walking and Moving Around Current Status [id number]): At least 1 percent but less than 20 percent impaired, limited or restricted Mobility: Walking and Moving Around Goal Status [phone]): At least 1 percent but less than 20 percent impaired, limited or restricted Mobility: Walking and Moving Around Discharge Status [phone]): At least 1 percent but less than 20 percent impaired, limited or restricted    Despina Pole 11/16/2016, 8:03 PM Carita Pian. Sanjuana Kava, [place] Pager [phone]

## 2016-11-16 NOTE — Progress Notes (Signed)
EEG Completed; Results Pending  

## 2016-11-16 NOTE — Consult Note (Signed)
NEURO HOSPITALIST CONSULT NOTE   Requesting physician: Dr Sloan Leiter Reason for Consult: speech disturbance  HPI:                                                                                                                                          Paula [name] is an 55 y.o. female with a medical history that is relevant for HLD, depression, anxiety, fibromyalgia, psychogenic tremors/dysarthria, and GERD, admitted to the hospital due to difficulty speaking. I reviewed the available neurological records which in a nut shell indicates that she had had comprehensive outpatient neurological evaluation by movement disorder specialists and was given a diagnosis of psychogenic tremors/dysarthria. In any case, she endorses difficulty expressing herself since this past Tuesday when she woke up around 5 am and couldn't talk. She said that she is noticing " some slight improvement". Michela Pitcher that this is nothing new " but before it will last only for a half day and go away".  Patient tells me that " the stuttering is off an on, at times I can talk normal but then I want to say something and can not get it out correctly'. Said that her ability to process things mentally is impaired which interferes with her ability to comprehend what she reads or try to write. She stressed the fact that " the speech is worse because my head pressure and watery eyes are also worsening". Similarly, she reports that often times the speech impediment is  associated with palpitations and she starts gasping for air for a couple of seconds. Denies having short tem memory difficulty or falls, although complains of imbalance and poor bladder control. No visual disturbances, focal weakness, muscle fasciculations, body rigidity, visual hallucinations, altered sense of smell, dream enacting behavior, or difficulty swallowing. No family history of degenerative brain disease or movement disorders. MRI brain performed during  this admission was independently reviewed and showed no acute or chronic abnormality that could explain her symptoms.   Past Medical History:  Diagnosis Date  . Anxiety   . Chronic bronchitis ([place])   . Chronic lower back pain   . Depression   . Diverticulitis   . Diverticulosis of colon   . Expressive aphasia 11/15/2016  . Fibromyalgia   . GERD (gastroesophageal reflux disease)   . Heart murmur    "murmur as a child" (11/15/2016)  . High cholesterol   . History of gastric ulcer   . History of hiatal hernia   . History of kidney stones   . History of thyroid nodule   . Hyperlipidemia   . IBS (irritable bowel syndrome)   . Intermittent palpitations    stress related--  normal stress test   . Intermittent vertigo   . Irregular heart beat   . Migraine    "  a few times/year now; maybe" (11/15/2016)  . OSA on CPAP    wears CPAP nightly ;study in epic 02-01-2014 (11/15/2016)  . Osteoarthritis    "knees, hands, back, hips" (11/15/2016)  . Pneumonia X 1   hx of  . Psychogenic tremor    intermittant head tremor-(11/15/2016)  . Refusal of blood transfusions as patient is Jehovah's Witness   . RLS (restless legs syndrome)   . Shortness of breath dyspnea    with ambulation  . Urgency of urination   . Urinary incontinence     Past Surgical History:  Procedure Laterality Date  . ABDOMINAL HYSTERECTOMY  1990  . ANTERIOR CERVICAL DECOMP/DISCECTOMY FUSION  02-10-2004   C4 -- C7  . BACK SURGERY    . CARDIOVASCULAR STRESS TEST  10-06-2014  [facility]   Lexiscan no exercise study;  normal study,  no ischemia or scar,  normal LV function and wall motion, ef 67%  . CESAREAN SECTION  1977; 1982  . COLONOSCOPY    . CYSTOSCOPY W/ RETROGRADES Right 06/07/2015   Procedure: CYSTOSCOPY WITH RETROGRADE PYELOGRAM;  Surgeon: Ardis Hughs, MD;  Location: [facility];  Service: Urology;  Laterality: Right;  . CYSTOSCOPY WITH URETEROSCOPY AND STENT PLACEMENT Right 06/07/2015    Procedure: RIGHT URETEROSCOPY, LASER LITHOTRIPSY, STONE REMOVAL AND RIGHT URETERAL STENT PLACEMENT;  Surgeon: Ardis Hughs, MD;  Location: [facility];  Service: Urology;  Laterality: Right;  . HERNIA REPAIR    . HOLMIUM LASER APPLICATION N/A 05/05/4033   Procedure: HOLMIUM LASER APPLICATION;  Surgeon: Ardis Hughs, MD;  Location: [facility];  Service: Urology;  Laterality: N/A;  . LAPAROSCOPIC CHOLECYSTECTOMY  10-07-2000  . UMBILICAL HERNIA REPAIR  age 12  . UPPER GI ENDOSCOPY      Family History  Problem Relation Age of Onset  . Colon cancer Maternal Grandfather   . Hypertension Maternal Grandfather   . Cancer Maternal Grandfather     lung  . Hyperlipidemia Mother   . Hypertension Mother   . Cancer Maternal Aunt     lung  . Hypertension Maternal Grandmother   . Hypertension Brother   . Hypertension Sister     Family History: no MS, epilepsy, movement disorders, or brain tumors.   Social History:  reports that she quit smoking about 8 years ago. Her smoking use included Cigarettes. She has a 12.50 pack-year smoking history. She has never used smokeless tobacco. She reports that she drinks alcohol. She reports that she does not use drugs.  Allergies  Allergen Reactions  . Detrol [Tolterodine] Other (See Comments)    slurred speech, tremors, dizziness  . Gabapentin Palpitations and Other (See Comments)    Wt gain, tremor  . Aspirin Other (See Comments)    Avoids--- history of gastric ulcers   . Contrast Media [Iodinated Diagnostic Agents] Hives, Itching and Rash    CT contrast-Needed to take benadryl   . Hydrocodone-Acetaminophen Nausea And Vomiting  . Imitrex [Sumatriptan] Other (See Comments)    Body hurts  . Oxycodone Nausea And Vomiting  . Avelox [Moxifloxacin] Itching    MEDICATIONS:  I have reviewed the patient's  current medications.   ROS:                                                                                                                                       History obtained from chart review and the patient  General ROS: negative for - chills, fatigue, fever, night sweats, or weight loss Psychological ROS: negative for - behavioral disorder, hallucinations, memory difficulties, or suicidal ideation Ophthalmic ROS: negative for - blurry vision, double vision, eye pain or loss of vision ENT ROS: negative for - epistaxis, nasal discharge, oral lesions, sore throat, or tinnitus Allergy and Immunology ROS: negative for - hives or itchy/watery eyes Hematological and Lymphatic ROS: negative for - bleeding problems, bruising or swollen lymph nodes Endocrine ROS: negative for - galactorrhea, hair pattern changes, polydipsia/polyuria or temperature intolerance Respiratory ROS: negative for - cough, hemoptysis, shortness of breath or wheezing Cardiovascular ROS: negative for - chest pain, dyspnea on exertion, edema or irregular heartbeat Gastrointestinal ROS: negative for - abdominal pain, diarrhea, hematemesis, nausea/vomiting or stool incontinence Genito-Urinary ROS: negative for - dysuria, hematuria, incontinence or urinary frequency/urgency Musculoskeletal ROS: negative for - joint swelling or muscular weakness Neurological ROS: as noted in HPI Dermatological ROS: negative for rash and skin lesion changes   Blood pressure (!) 160/55, pulse 70, temperature 97.8 F (36.6 C), temperature source Oral, resp. rate 18, height 5\' 5"  (1.651 m), weight 115.7 kg (255 lb), SpO2 100 %.  Physical exam:  Constitutional: well developed, pleasant female in no apparent distress. Eyes: no jaundice or exophthalmos.  Head: normocephalic. Neck: supple, no bruits, no JVD. Cardiac: no murmurs. Lungs: clear. Abdomen: soft, no tender, no mass. Extremities: no edema, clubbing, or cyanosis.  Skin: no  rash   Neurologic Examination:                                                                                                      General: NAD Mental Status: Alert, oriented, thought content appropriate. Intermittent stuttering with very short periods of hesitant spontaneous language but no frank dysarthria or evidence of aphasia.  Able to follow 3 step commands without difficulty. Cranial Nerves: II:  Visual fields grossly normal, pupils equal, round, reactive to light and accommodation III,IV, VI: ptosis not present, extra-ocular motions intact bilaterally V,VII: smile symmetric, facial light touch sensation normal bilaterally VIII: hearing normal bilaterally IX,X: uvula rises symmetrically XI: bilateral shoulder shrug XII: midline tongue extension without atrophy or fasciculations Motor: There  is some giveaway weakness right UE but I can say that she has no objective muscle weakness. Tone and bulk:normal tone throughout; no atrophy noted Sensory: Pinprick and light touch intact throughout, bilaterally Deep Tendon Reflexes:  Right: Upper Extremity   Left: Upper extremity   biceps (C-5 to C-6) 2/4   biceps (C-5 to C-6) 2/4 tricep (C7) 2/4    triceps (C7) 2/4 Brachioradialis (C6) 2/4  Brachioradialis (C6) 2/4  Lower Extremity Lower Extremity  quadriceps (L-2 to L-4) 2/4   quadriceps (L-2 to L-4) 2/4 Achilles (S1) 2/4   Achilles (S1) 2/4  Plantars: Right: downgoing   Left: downgoing Coordination normal finger-to-nose,  normal heel-to-shin test. Head tremor that goes away when she gets distracted. Gait:  No ataxia or bradykinesia     Lab Results  Component Value Date/Time   CHOL 282 (H) 11/16/2016 12:39 AM    Results for orders placed or performed during the hospital encounter of 11/15/16 (from the past 48 hour(s))  Ethanol     Status: None   Collection Time: 11/15/16 12:55 PM  Result Value Ref Range   Alcohol, Ethyl (B) <5 <5 mg/dL    Comment:        LOWEST  DETECTABLE LIMIT FOR SERUM ALCOHOL IS 5 mg/dL FOR MEDICAL PURPOSES ONLY   Protime-INR     Status: None   Collection Time: 11/15/16 12:55 PM  Result Value Ref Range   Prothrombin Time 13.2 11.4 - 15.2 seconds   INR 1.00   APTT     Status: None   Collection Time: 11/15/16 12:55 PM  Result Value Ref Range   aPTT 31 24 - 36 seconds  CBC     Status: None   Collection Time: 11/15/16 12:55 PM  Result Value Ref Range   WBC 10.4 4.0 - 10.5 K/uL   RBC 4.50 3.87 - 5.11 MIL/uL   Hemoglobin 13.0 12.0 - 15.0 g/dL   HCT 10.4 75.7 - 47.1 %   MCV 88.0 78.0 - 100.0 fL   MCH 28.9 26.0 - 34.0 pg   MCHC 32.8 30.0 - 36.0 g/dL   RDW 39.3 20.4 - 28.9 %   Platelets 354 150 - 400 K/uL  Differential     Status: Abnormal   Collection Time: 11/15/16 12:55 PM  Result Value Ref Range   Neutrophils Relative % 46 %   Neutro Abs 4.8 1.7 - 7.7 K/uL   Lymphocytes Relative 45 %   Lymphs Abs 4.7 (H) 0.7 - 4.0 K/uL   Monocytes Relative 6 %   Monocytes Absolute 0.6 0.1 - 1.0 K/uL   Eosinophils Relative 3 %   Eosinophils Absolute 0.3 0.0 - 0.7 K/uL   Basophils Relative 0 %   Basophils Absolute 0.0 0.0 - 0.1 K/uL  Comprehensive metabolic panel     Status: Abnormal   Collection Time: 11/15/16 12:55 PM  Result Value Ref Range   Sodium 140 135 - 145 mmol/L   Potassium 4.6 3.5 - 5.1 mmol/L   Chloride 106 101 - 111 mmol/L   CO2 25 22 - 32 mmol/L   Glucose, Bld 109 (H) 65 - 99 mg/dL   BUN 11 6 - 20 mg/dL   Creatinine, Ser 3.57 0.44 - 1.00 mg/dL   Calcium 66.5 8.9 - 26.9 mg/dL   Total Protein 7.2 6.5 - 8.1 g/dL   Albumin 4.1 3.5 - 5.0 g/dL   AST 42 (H) 15 - 41 U/L   ALT 48 14 - 54 U/L   Alkaline  Phosphatase 89 38 - 126 U/L   Total Bilirubin 0.6 0.3 - 1.2 mg/dL   GFR calc non Af Amer >60 >60 mL/min   GFR calc Af Amer >60 >60 mL/min    Comment: (NOTE) The eGFR has been calculated using the CKD EPI equation. This calculation has not been validated in all clinical situations. eGFR's persistently <60 mL/min  signify possible Chronic Kidney Disease.    Anion gap 9 5 - 15  I-stat troponin, ED (not at [facility], [facility])     Status: None   Collection Time: 11/15/16  1:11 PM  Result Value Ref Range   Troponin i, poc 0.00 0.00 - 0.08 ng/mL   Comment 3            Comment: Due to the release kinetics of cTnI, a negative result within the first hours of the onset of symptoms does not rule out myocardial infarction with certainty. If myocardial infarction is still suspected, repeat the test at appropriate intervals.   I-Stat Chem 8, ED  (not at [facility], [facility])     Status: Abnormal   Collection Time: 11/15/16  1:13 PM  Result Value Ref Range   Sodium 141 135 - 145 mmol/L   Potassium 4.6 3.5 - 5.1 mmol/L   Chloride 107 101 - 111 mmol/L   BUN 13 6 - 20 mg/dL   Creatinine, Ser 0.70 0.44 - 1.00 mg/dL   Glucose, Bld 106 (H) 65 - 99 mg/dL   Calcium, Ion 1.22 1.15 - 1.40 mmol/L   TCO2 26 0 - 100 mmol/L   Hemoglobin 13.3 12.0 - 15.0 g/dL   HCT 39.0 36.0 - 46.0 %  Urine rapid drug screen (hosp performed)not at [facility]     Status: None   Collection Time: 11/15/16  1:42 PM  Result Value Ref Range   Opiates NONE DETECTED NONE DETECTED   Cocaine NONE DETECTED NONE DETECTED   Benzodiazepines NONE DETECTED NONE DETECTED   Amphetamines NONE DETECTED NONE DETECTED   Tetrahydrocannabinol NONE DETECTED NONE DETECTED   Barbiturates NONE DETECTED NONE DETECTED    Comment:        DRUG SCREEN FOR MEDICAL PURPOSES ONLY.  IF CONFIRMATION IS NEEDED FOR ANY PURPOSE, NOTIFY LAB WITHIN 5 DAYS.        LOWEST DETECTABLE LIMITS FOR URINE DRUG SCREEN Drug Class       Cutoff (ng/mL) Amphetamine      1000 Barbiturate      200 Benzodiazepine   203 Tricyclics       559 Opiates          300 Cocaine          300 THC              50   Urinalysis, Routine w reflex microscopic     Status: None   Collection Time: 11/15/16  1:43 PM  Result Value Ref Range   Color, Urine YELLOW YELLOW   APPearance CLEAR CLEAR   Specific Gravity,  Urine 1.011 1.005 - 1.030   pH 7.0 5.0 - 8.0   Glucose, UA NEGATIVE NEGATIVE mg/dL   Hgb urine dipstick NEGATIVE NEGATIVE   Bilirubin Urine NEGATIVE NEGATIVE   Ketones, ur NEGATIVE NEGATIVE mg/dL   Protein, ur NEGATIVE NEGATIVE mg/dL   Nitrite NEGATIVE NEGATIVE   Leukocytes, UA NEGATIVE NEGATIVE  Troponin I     Status: None   Collection Time: 11/15/16  7:17 PM  Result Value Ref Range   Troponin I <0.03 <0.03 ng/mL  TSH     Status: None   Collection Time: 11/15/16  7:17 PM  Result Value Ref Range   TSH 1.586 0.350 - 4.500 uIU/mL    Comment: Performed by a 3rd Generation assay with a functional sensitivity of <=0.01 uIU/mL.  Troponin I     Status: None   Collection Time: 11/16/16 12:39 AM  Result Value Ref Range   Troponin I <0.03 <0.03 ng/mL  Lipid panel     Status: Abnormal   Collection Time: 11/16/16 12:39 AM  Result Value Ref Range   Cholesterol 282 (H) 0 - 200 mg/dL   Triglycerides 351 (H) <150 mg/dL   HDL 39 (L) >40 mg/dL   Total CHOL/HDL Ratio 7.2 RATIO   VLDL 70 (H) 0 - 40 mg/dL   LDL Cholesterol 173 (H) 0 - 99 mg/dL    Comment:        Total Cholesterol/HDL:CHD Risk Coronary Heart Disease Risk Table                     Men   Women  1/2 Average Risk   3.4   3.3  Average Risk       5.0   4.4  2 X Average Risk   9.6   7.1  3 X Average Risk  23.4   11.0        Use the calculated Patient Ratio above and the CHD Risk Table to determine the patient's CHD Risk.        ATP III CLASSIFICATION (LDL):  <100     mg/dL   Optimal  100-129  mg/dL   Near or Above                    Optimal  130-159  mg/dL   Borderline  160-189  mg/dL   High  >190     mg/dL   Very High     Ct Head Wo Contrast  Result Date: 11/15/2016 CLINICAL DATA:  Dizziness with slurred speech and expressive aphasia for 3 days. EXAM: CT HEAD WITHOUT CONTRAST TECHNIQUE: Contiguous axial images were obtained from the base of the skull through the vertex without intravenous contrast. COMPARISON:  MRI brain  07/29/2016.  CT head 05/22/2015. FINDINGS: Brain: There is no evidence of acute intracranial hemorrhage, mass lesion, brain edema or extra-axial fluid collection. The ventricles and subarachnoid spaces are appropriately sized for age. There is no CT evidence of acute cortical infarction. Vascular: No hyperdense vessel or unexpected calcification. Skull: Negative for fracture or focal lesion. Sinuses/Orbits: The visualized paranasal sinuses and mastoid air cells are clear. No orbital abnormalities are seen. Other: None. IMPRESSION: Stable unremarkable noncontrast head CT Electronically Signed   By: Richardean Sale M.D.   On: 11/15/2016 13:15   Mr Brain Wo Contrast (neuro Protocol)  Result Date: 11/15/2016 CLINICAL DATA:  Dysphagia. Difficulty speaking with tremors and dizziness 1 day after beginning fall tearing ninth EXAM: MRI HEAD WITHOUT CONTRAST TECHNIQUE: Multiplanar, multiecho pulse sequences of the brain and surrounding structures were obtained without intravenous contrast. COMPARISON:  CT head from the same day.  MRI brain 07/29/2016. FINDINGS: Brain: No acute infarct, hemorrhage, or mass lesion is present. The ventricles are of normal size. No significant extraaxial fluid collection is present. No significant white matter disease is present. Internal auditory canals are within normal limits bilaterally. The brainstem and cerebellum are normal. Vascular: Flow is present in the major intracranial arteries. Skull and upper cervical spine: Skullbase is within  normal limits. Craniocervical junction is within normal limits. Midline intracranial sagittal structures are normal. Cervical fusion and corpectomy began at C4-5. Sinuses/Orbits: The paranasal sinuses and mastoid air cells are clear. The globes and orbits are within normal limits. IMPRESSION: 1. Normal MRI of the brain. 2. Cervical spine surgery. Electronically Signed   By: San Morelle M.D.   On: 11/15/2016 17:08    Assessment/Plan: 56 y/o  with HLD, depression, anxiety, fibromyalgia, psychogenic tremors/dysarthria, and GERD, admitted to [facility] due to difficulty speaking since last Tuesday. Some inconsistencies on exam but I can Viann Fish that she really has no definitive lateralizing signs on neuro-exam. In fact, her head tremor goes away when she gets distracted. Her speech/languge dysfunction is rather unusual as it is episodic and intermittent and the truth of the matter is that I don't think we are dealing with a primary structural language disorder like primary progressive aphasia or a sporadic structural brain disorder. 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 No further inpatient neurological testing needed but could benefit of full neurocognitive and speech testing as outpatient.     Dorian Pod, MD 11/16/2016, 3:46 PM

## 2016-11-16 NOTE — Evaluation (Signed)
Speech Language Pathology Evaluation Patient Details Name: Paula [name] MRN: [id number] DOB: 1961-02-10 Today's Date: 11/16/2016 Time: [id number] SLP Time Calculation (min) (ACUTE ONLY): 28 min  Problem List:  Patient Active Problem List   Diagnosis Date Noted  . Expressive aphasia 11/15/2016  . Cough 11/08/2015  . GERD (gastroesophageal reflux disease) 11/08/2015  . B12 deficiency 12/07/2014  . Wart viral 11/07/2014  . Coarse tremors 02/09/2014  . OSA on CPAP 01/28/2014  . Psychosomatic disorder 01/14/2014  . Dyspnea 01/12/2014  . Tremor of face and hands 01/12/2014  . Breast pain in female 02/09/2013  . Elevated WBC count 10/12/2012  . Abdominal pain, other specified site 10/09/2012  . Abdominal bloating 10/09/2012  . Ovarian cystic mass 05/25/2012  . Shoulder pain, right 03/11/2012  . Right arm pain 12/30/2011  . ACNE, ROSACEA 07/17/2010  . LEG PAIN 07/17/2010  . NECK PAIN 05/10/2010  . ABDOMINAL PAIN, EPIGASTRIC 05/10/2010  . BURSITIS, LEFT HIP 05/01/2010  . PLANTAR FASCIITIS, BILATERAL 05/01/2010  . HYPERGLYCEMIA 01/05/2010  . PRURITUS 11/27/2009  . TOBACCO USE, QUIT 11/27/2009  . ANXIETY 11/22/2008  . FATIGUE 11/22/2008  . Essential hypertension 10/07/2008  . Insomnia, unspecified 08/30/2008  . OBESITY, MORBID 08/19/2008  . NAUSEA ALONE 05/02/2008  . VERTIGO 01/19/2008  . DIARRHEA 01/19/2008  . THYROID NODULE 06/23/2007  . HYPERLIPIDEMIA 06/23/2007  . Situational depression 06/23/2007  . ADD 06/23/2007  . TMJ SYNDROME 06/23/2007  . GERD 06/23/2007  . Irritable bowel syndrome 06/23/2007  . Headache 06/23/2007   Past Medical History:  Past Medical History:  Diagnosis Date  . Anxiety   . Chronic bronchitis ([place])   . Chronic lower back pain   . Depression   . Diverticulitis   . Diverticulosis of colon   . Expressive aphasia 11/15/2016  . Fibromyalgia   . GERD (gastroesophageal reflux disease)   . Heart murmur    "murmur as a child" (11/15/2016)   . High cholesterol   . History of gastric ulcer   . History of hiatal hernia   . History of kidney stones   . History of thyroid nodule   . Hyperlipidemia   . IBS (irritable bowel syndrome)   . Intermittent palpitations    stress related--  normal stress test   . Intermittent vertigo   . Irregular heart beat   . Migraine    "a few times/year now; maybe" (11/15/2016)  . OSA on CPAP    wears CPAP nightly ;study in epic 02-01-2014 (11/15/2016)  . Osteoarthritis    "knees, hands, back, hips" (11/15/2016)  . Pneumonia X 1   hx of  . Psychogenic tremor    intermittant head tremor-(11/15/2016)  . Refusal of blood transfusions as patient is Jehovah's Witness   . RLS (restless legs syndrome)   . Shortness of breath dyspnea    with ambulation  . Urgency of urination   . Urinary incontinence    Past Surgical History:  Past Surgical History:  Procedure Laterality Date  . ABDOMINAL HYSTERECTOMY  1990  . ANTERIOR CERVICAL DECOMP/DISCECTOMY FUSION  02-10-2004   C4 -- C7  . BACK SURGERY    . CARDIOVASCULAR STRESS TEST  10-06-2014  DR [facility]   Lexiscan no exercise study;  normal study,  no ischemia or scar,  normal LV function and wall motion, ef 67%  . CESAREAN SECTION  1977; 1982  . COLONOSCOPY    . CYSTOSCOPY W/ RETROGRADES Right 06/07/2015   Procedure: CYSTOSCOPY WITH RETROGRADE PYELOGRAM;  Surgeon: Ardis Hughs,  MD;  Location: [place];  Service: Urology;  Laterality: Right;  . CYSTOSCOPY WITH URETEROSCOPY AND STENT PLACEMENT Right 06/07/2015   Procedure: RIGHT URETEROSCOPY, LASER LITHOTRIPSY, STONE REMOVAL AND RIGHT URETERAL STENT PLACEMENT;  Surgeon: Ardis Hughs, MD;  Location: [facility];  Service: Urology;  Laterality: Right;  . HERNIA REPAIR    . HOLMIUM LASER APPLICATION N/A [id number]   Procedure: HOLMIUM LASER APPLICATION;  Surgeon: Ardis Hughs, MD;  Location: [facility];  Service: Urology;  Laterality: N/A;   . LAPAROSCOPIC CHOLECYSTECTOMY  10-07-2000  . UMBILICAL HERNIA REPAIR  age 11  . UPPER GI ENDOSCOPY     HPI:   55 y.o. female with a past medical history of GERD, palpitations, previously diagnosed as having psychogenic tremors who was in her usual state of health until Tuesday when she woke up and found it difficult to speak. She was unable to express her thoughts. Her husband felt that she had slurred speech. There was no facial droop. She has been having dizziness as well. Headaches. She also complains of pain around her neck area and shoulders. She felt like she might pass out, but she did not have any syncopal episodes. No fever, no chills. Denies any weakness in any one side of the body. No falls or injuries. On Monday she was prescribed a new medications, Tolterodine, for bladder issues. She took another dose on Tuesday. Hasn't taken any of that medication since then. She blames her symptoms on that medication. And she also feels that her symptoms have been getting worse. So she decided to come into the emergency department for further evaluation; MRI head on 11/15/16 negative.   Assessment / Plan / Recommendation Clinical Impression   Pt exhibits intermittent episodes of expressive aphasia/dysfluencies of part and whole-word repetitions during conversation per pt report, although expressive aphasia was not noted during SLE; pt did exhibit 1-2 episodes of part-word repetitions, but this did not affect her ability to relay information to SLP.  Auditory comprehension tasks were [facility] and cognitive tasks as well during SLE; pt c/o intermittent memory deficits, but was able to recall information from prior PT evaluation an hr earlier and specific information PT relayed to her during the evaluation.  Speech 100% intelligible within complex conversation and naming tasks all WFL; ST will f/u 1-2x to assess dysfluencies within conversation and provide compensatory strategies prn.      SLP Assessment   Patient needs continued Speech Language Pathology Services    Follow Up Recommendations  Other (comment) (TBD)    Frequency and Duration min 2x/week  1 week      SLP Evaluation Cognition  Overall Cognitive Status: Within Functional Limits for tasks assessed Arousal/Alertness: Awake/alert Orientation Level: Oriented X4 Memory: Appears intact Awareness: Appears intact Problem Solving: Appears intact Safety/Judgment: Appears intact       Comprehension  Auditory Comprehension Overall Auditory Comprehension: Appears within functional limits for tasks assessed Yes/No Questions: Within Functional Limits Commands: Within Functional Limits Conversation: Complex Visual Recognition/Discrimination Discrimination: Within Function Limits Reading Comprehension Reading Status: Within funtional limits    Expression Expression Primary Mode of Expression: Verbal Verbal Expression Overall Verbal Expression: Impaired Initiation: No impairment Level of Generative/Spontaneous Verbalization: Conversation (Intermittent dysfluencies noted per pt) Repetition: No impairment Naming: No impairment Pragmatics: No impairment Interfering Components: Other (comment) (fluency) Non-Verbal Means of Communication: Not applicable Other Verbal Expression Comments:  (Pt c/o dysfluent episodes where words "get stuck.") Written Expression Dominant Hand: Right Written Expression: Not  tested   Oral / Motor  Oral Motor/Sensory Function Overall Oral Motor/Sensory Function: Within functional limits Motor Speech Overall Motor Speech: Appears within functional limits for tasks assessed Respiration: Within functional limits Phonation: Normal Resonance: Within functional limits Articulation: Within functional limitis Intelligibility: Intelligible Motor Planning: Witnin functional limits Motor Speech Errors: Not applicable            Functional Assessment Tool Used: NOMS Functional Limitations: Spoken  language expressive Spoken Language Expression Current Status [id number]): At least 20 percent but less than 40 percent impaired, limited or restricted Spoken Language Expression Goal Status [phone]): At least 1 percent but less than 20 percent impaired, limited or restricted         Audry Kauzlarich,PAT, M.S., CCC-SLP 11/16/2016, 2:07 PM

## 2016-11-16 NOTE — Progress Notes (Signed)
PROGRESS NOTE        PATIENT DETAILS Name: Paula [name] Age: 55 y.o. Sex: female Date of Birth: Feb 21, 1961 Admit Date: 11/15/2016 Admitting Physician Bonnielee Haff, MD [id number] Weyman Croon, MD  Brief Narrative: Patient is a 55 y.o. female with prior hx of psychogenic tremors/dysarthria in 2016-admitted with dysarthria on 12/15. Admitted for further evaluation and treatment.   Subjective: Speech fluent when distracted-but then has dysarthria-claims she is having difficulty expressing herself as well. Complains of palpiations  Assessment/Plan: Dysarthria/Expressive aphasia: no other focal neurologic deficits-exam is inconsistent-MRI Brain negative. Await EEG/Echo/Carotids-but suspect this is functional.Re-assurance provided-claims she is under stress at home-claims husband has PTSD. Await Neuro and Psych eval.   Reported Palpitations:telemetry neg-recently underwent a 30 day event monitor as well.  GERD: stable-continue PPI  [id number] CPAP  DVT Prophylaxis: Prophylactic Heparin   Code Status: Full code   Family Communication: None at bedside  Disposition Plan: Remain inpatient-home when work up complete  Antimicrobial agents: None  Procedures: None  CONSULTS:  neurology and psychiatry  Time spent: 25- minutes-Greater than 50% of this time was spent in counseling, explanation of diagnosis, planning of further management, and coordination of care.  MEDICATIONS: Anti-infectives    None      Scheduled Meds: . aspirin  300 mg Rectal Daily   Or  . aspirin  325 mg Oral Daily  . heparin  5,000 Units Subcutaneous Q8H  . pantoprazole  40 mg Oral BID   Continuous Infusions: . sodium chloride 50 mL/hr at 11/15/16 2012   PRN Meds:.acetaminophen **OR** acetaminophen (TYLENOL) oral liquid 160 mg/5 mL **OR** acetaminophen   PHYSICAL EXAM: Vital signs: Vitals:   11/16/16 0100 11/16/16 0303 11/16/16 0500 11/16/16 0700  BP: (!)  146/66 (!) 134/58 (!) 146/62 130/75  Pulse: 65 68 60 65  Resp: 20 20 20 16   Temp: 97.8 F (36.6 C) 98.3 F (36.8 C) 98.1 F (36.7 C) 98.3 F (36.8 C)  TempSrc: Oral Oral Oral Oral  SpO2: 99% 98% 100% 99%  Weight:      Height:       Filed Weights   11/15/16 1034  Weight: 115.7 kg (255 lb)   Body mass index is 42.43 kg/m.   General appearance :Awake, alert, not in any distress. Eyes:, pupils equally reactive to light and accomodation,no scleral icterus.Pink conjunctiva HEENT: Atraumatic and Normocephalic Neck: supple, no JVD. No cervical lymphadenopathy. No thyromegaly Resp:Good air entry bilaterally, no added sounds  CVS: S1 S2 regular, no murmurs.  GI: Bowel sounds present, Non tender and not distended with no gaurding, rigidity or rebound.No organomegaly Extremities: B/L Lower Ext shows no edema, both legs are warm to touch Neurology:  speech clear,Non focal, sensation is grossly intact. Psychiatric: Normal judgment and insight. Alert and oriented x 3. Normal mood. Musculoskeletal:.No digital cyanosis Skin:No Rash, warm and dry Wounds:N/A  I have personally reviewed following labs and imaging studies  LABORATORY DATA: CBC:  Recent Labs Lab 11/15/16 1255 11/15/16 1313  WBC 10.4  --   NEUTROABS 4.8  --   HGB 13.0 13.3  HCT 39.6 39.0  MCV 88.0  --   PLT 354  --     Basic Metabolic Panel:  Recent Labs Lab 11/15/16 1255 11/15/16 1313  NA 140 141  K 4.6 4.6  CL 106 107  CO2 25  --   GLUCOSE 109* 106*  BUN 11 13  CREATININE 0.78 0.70  CALCIUM 10.2  --     GFR: Estimated Creatinine Clearance: 99.8 mL/min (by C-G formula based on SCr of 0.7 mg/dL).  Liver Function Tests:  Recent Labs Lab 11/15/16 1255  AST 42*  ALT 48  ALKPHOS 89  BILITOT 0.6  PROT 7.2  ALBUMIN 4.1   No results for input(s): LIPASE, AMYLASE in the last 168 hours. No results for input(s): AMMONIA in the last 168 hours.  Coagulation Profile:  Recent Labs Lab 11/15/16 1255   INR 1.00    Cardiac Enzymes:  Recent Labs Lab 11/15/16 1917 11/16/16 0039  TROPONINI <0.03 <0.03    BNP (last 3 results) No results for input(s): PROBNP in the last 8760 hours.  HbA1C: No results for input(s): HGBA1C in the last 72 hours.  CBG: No results for input(s): GLUCAP in the last 168 hours.  Lipid Profile:  Recent Labs  11/16/16 0039  CHOL 282*  HDL 39*  LDLCALC 173*  TRIG 351*  CHOLHDL 7.2    Thyroid Function Tests:  Recent Labs  11/15/16 1917  TSH 1.586    Anemia Panel: No results for input(s): VITAMINB12, FOLATE, FERRITIN, TIBC, IRON, RETICCTPCT in the last 72 hours.  Urine analysis:    Component Value Date/Time   COLORURINE YELLOW 11/15/2016 [place] 11/15/2016 1343   LABSPEC 1.011 11/15/2016 1343   PHURINE 7.0 11/15/2016 1343   GLUCOSEU NEGATIVE 11/15/2016 1343   GLUCOSEU NEGATIVE 01/29/2013 1324   HGBUR NEGATIVE 11/15/2016 1343   BILIRUBINUR NEGATIVE 11/15/2016 1343   KETONESUR NEGATIVE 11/15/2016 1343   PROTEINUR NEGATIVE 11/15/2016 1343   UROBILINOGEN 0.2 06/05/2015 0141   NITRITE NEGATIVE 11/15/2016 1343   LEUKOCYTESUR NEGATIVE 11/15/2016 1343    Sepsis Labs: Lactic Acid, Venous No results found for: LATICACIDVEN  MICROBIOLOGY: No results found for this or any previous visit (from the past 240 hour(s)).  RADIOLOGY STUDIES/RESULTS: Ct Head Wo Contrast  Result Date: 11/15/2016 CLINICAL DATA:  Dizziness with slurred speech and expressive aphasia for 3 days. EXAM: CT HEAD WITHOUT CONTRAST TECHNIQUE: Contiguous axial images were obtained from the base of the skull through the vertex without intravenous contrast. COMPARISON:  MRI brain 07/29/2016.  CT head 05/22/2015. FINDINGS: Brain: There is no evidence of acute intracranial hemorrhage, mass lesion, brain edema or extra-axial fluid collection. The ventricles and subarachnoid spaces are appropriately sized for age. There is no CT evidence of acute cortical  infarction. Vascular: No hyperdense vessel or unexpected calcification. Skull: Negative for fracture or focal lesion. Sinuses/Orbits: The visualized paranasal sinuses and mastoid air cells are clear. No orbital abnormalities are seen. Other: None. IMPRESSION: Stable unremarkable noncontrast head CT Electronically Signed   By: Richardean Sale M.D.   On: 11/15/2016 13:15   Mr Brain Wo Contrast (neuro Protocol)  Result Date: 11/15/2016 CLINICAL DATA:  Dysphagia. Difficulty speaking with tremors and dizziness 1 day after beginning fall tearing ninth EXAM: MRI HEAD WITHOUT CONTRAST TECHNIQUE: Multiplanar, multiecho pulse sequences of the brain and surrounding structures were obtained without intravenous contrast. COMPARISON:  CT head from the same day.  MRI brain 07/29/2016. FINDINGS: Brain: No acute infarct, hemorrhage, or mass lesion is present. The ventricles are of normal size. No significant extraaxial fluid collection is present. No significant white matter disease is present. Internal auditory canals are within normal limits bilaterally. The brainstem and cerebellum are normal. Vascular: Flow is present in the major intracranial arteries. Skull and upper cervical spine: Skullbase is within normal limits.  Craniocervical junction is within normal limits. Midline intracranial sagittal structures are normal. Cervical fusion and corpectomy began at C4-5. Sinuses/Orbits: The paranasal sinuses and mastoid air cells are clear. The globes and orbits are within normal limits. IMPRESSION: 1. Normal MRI of the brain. 2. Cervical spine surgery. Electronically Signed   By: San Morelle M.D.   On: 11/15/2016 17:08     LOS: 0 days   Oren Binet, MD  [facility] Pager:[phone]  If 7PM-7AM, please contact night-coverage [email or web address] Password [email or web address] 11/16/2016, 10:39 AM

## 2016-11-16 NOTE — Progress Notes (Signed)
  Echocardiogram 2D Echocardiogram has been performed.  Jennette Dubin 11/16/2016, 11:04 AM

## 2016-11-17 DIAGNOSIS — R4701 Aphasia: Secondary | ICD-10-CM | POA: Diagnosis not present

## 2016-11-17 DIAGNOSIS — Z9989 Dependence on other enabling machines and devices: Secondary | ICD-10-CM | POA: Diagnosis not present

## 2016-11-17 DIAGNOSIS — K219 Gastro-esophageal reflux disease without esophagitis: Secondary | ICD-10-CM | POA: Diagnosis not present

## 2016-11-17 DIAGNOSIS — G4733 Obstructive sleep apnea (adult) (pediatric): Secondary | ICD-10-CM | POA: Diagnosis not present

## 2016-11-17 LAB — VAS US CAROTID
LCCADSYS: 97 cm/s
LCCAPDIAS: 25 cm/s
LEFT ECA DIAS: -13 cm/s
LEFT VERTEBRAL DIAS: 15 cm/s
Left CCA dist dias: 29 cm/s
Left CCA prox sys: 109 cm/s
Left ICA dist dias: -20 cm/s
Left ICA dist sys: -65 cm/s
Left ICA prox dias: -23 cm/s
Left ICA prox sys: -82 cm/s
RCCADSYS: -80 cm/s
RCCAPDIAS: -29 cm/s
RCCAPSYS: -94 cm/s
RIGHT ECA DIAS: -14 cm/s
RIGHT VERTEBRAL DIAS: -24 cm/s

## 2016-11-17 NOTE — Discharge Summary (Signed)
PATIENT DETAILS Name: Paula [name] Age: 55 y.o. Sex: female Date of Birth: November 02, 1961 MRN: [id number]. Admitting Physician: Bonnielee Haff, MD [id number] Weyman Croon, MD  Admit Date: 11/15/2016 Discharge date: 11/17/2016  Recommendations for Outpatient Follow-up:  1. Follow up with PCP in 1-2 weeks 2. Please obtain BMP/CBC in one week 3. Consider referral to Psychiatry in the outpatient setting.  4. EEG pending-follow.  Admitted From:  Home  Disposition: [place]: No  Equipment/Devices: None  Discharge Condition: Stable  CODE STATUS: FULL CODE  Diet recommendation:  Regular  Brief Summary: See H&P, Labs, Consult and Test reports for all details in brief,Patient is a 55 y.o. female with prior hx of psychogenic tremors/dysarthria in 2016-admitted with dysarthria on 12/15. Admitted for further evaluation and treatment.   Brief Hospital Course: Dysarthria/Expressive aphasia: Likely functional in etiology. MRI Brain negative for CVA. Echocardiogram showed preserved ejection fraction.Carotid Doppler was negative for significant stenosis. EEG still pending. Her exam is very inconsistent-and at times she has fluent speech. Neurology consulted, no further recommendations. I have provided reassurance to the patient at length this morning. She is stable for discharge, I have encouraged patient to seek outpatient psychiatry evaluation. She does acknowledge stressors at home-claims her husband has PTSD.  Reported Palpitations:telemetry neg-recently underwent a 30 day event monitor as well. Continue outpatient follow-up with cardiology  GERD: stable-continue PPI  [id number] CPAP  Procedures/Studies: None  Discharge Diagnoses:  Principal Problem:   Expressive aphasia Active Problems:   Headache   Tremor of face and hands   OSA on CPAP   Coarse tremors   GERD (gastroesophageal reflux disease)   Discharge Instructions:  Activity:  As tolerated  with Full fall precautions use walker/cane & assistance as needed  Discharge Instructions    Diet general    Complete by:  As directed    Discharge instructions    Complete by:  As directed    Follow with Primary MD  Reginia Naas, MD    Please get a complete blood count and chemistry panel checked by your Primary MD at your next visit, and again as instructed by your Primary MD.  Get Medicines reviewed and adjusted: Please take all your medications with you for your next visit with your Primary MD  Laboratory/radiological data: Please request your Primary MD to go over all hospital tests and procedure/radiological results at the follow up, please ask your Primary MD to get all Hospital records sent to his/her office.  In some cases, they will be blood work, cultures and biopsy results pending at the time of your discharge. Please request that your primary care M.D. follows up on these results.  Also Note the following: If you experience worsening of your admission symptoms, develop shortness of breath, life threatening emergency, suicidal or homicidal thoughts you must seek medical attention immediately by calling 911 or calling your MD immediately  if symptoms less severe.  You must read complete instructions/literature along with all the possible adverse reactions/side effects for all the Medicines you take and that have been prescribed to you. Take any new Medicines after you have completely understood and accpet all the possible adverse reactions/side effects.   Do not drive when taking Pain medications or sleeping medications (Benzodaizepines)  Do not take more than prescribed Pain, Sleep and Anxiety Medications. It is not advisable to combine anxiety,sleep and pain medications without talking with your primary care practitioner  Special Instructions: If you have smoked or chewed Tobacco  in the last  2 yrs please stop smoking, stop any regular Alcohol  and or any Recreational  drug use.  Wear Seat belts while driving.  Please note: You were cared for by a hospitalist during your hospital stay. Once you are discharged, your primary care physician will handle any further medical issues. Please note that NO REFILLS for any discharge medications will be authorized once you are discharged, as it is imperative that you return to your primary care physician (or establish a relationship with a primary care physician if you do not have one) for your post hospital discharge needs so that they can reassess your need for medications and monitor your lab values.   Increase activity slowly    Complete by:  As directed      Allergies as of 11/17/2016      Reactions   Detrol [tolterodine] Other (See Comments)   slurred speech, tremors, dizziness   Gabapentin Palpitations, Other (See Comments)   Wt gain, tremor   Aspirin Other (See Comments)   Avoids--- history of gastric ulcers    Contrast Media [iodinated Diagnostic Agents] Hives, Itching, Rash   CT contrast-Needed to take benadryl    Hydrocodone-acetaminophen Nausea And Vomiting   Imitrex [sumatriptan] Other (See Comments)   Body hurts   Oxycodone Nausea And Vomiting   Avelox [moxifloxacin] Itching      Medication List    TAKE these medications   ELESTRIN 0.52 MG/0.87 GM (0.06%) Gel Generic drug:  Estradiol Apply 1 application topically daily.   omeprazole 20 MG capsule Commonly known as:  PRILOSEC Take 20 mg by mouth daily.      Follow-up Information    Reginia Naas, MD. Schedule an appointment as soon as possible for a visit in 2 week(s).   Specialty:  Family Medicine Contact information: [place] [address] [phone]          Allergies  Allergen Reactions  . Detrol [Tolterodine] Other (See Comments)    slurred speech, tremors, dizziness  . Gabapentin Palpitations and Other (See Comments)    Wt gain, tremor  . Aspirin Other (See Comments)     Avoids--- history of gastric ulcers   . Contrast Media [Iodinated Diagnostic Agents] Hives, Itching and Rash    CT contrast-Needed to take benadryl   . Hydrocodone-Acetaminophen Nausea And Vomiting  . Imitrex [Sumatriptan] Other (See Comments)    Body hurts  . Oxycodone Nausea And Vomiting  . Avelox [Moxifloxacin] Itching    Consultations:   neurology  Other Procedures/Studies: Ct Head Wo Contrast  Result Date: 11/15/2016 CLINICAL DATA:  Dizziness with slurred speech and expressive aphasia for 3 days. EXAM: CT HEAD WITHOUT CONTRAST TECHNIQUE: Contiguous axial images were obtained from the base of the skull through the vertex without intravenous contrast. COMPARISON:  MRI brain 07/29/2016.  CT head 05/22/2015. FINDINGS: Brain: There is no evidence of acute intracranial hemorrhage, mass lesion, brain edema or extra-axial fluid collection. The ventricles and subarachnoid spaces are appropriately sized for age. There is no CT evidence of acute cortical infarction. Vascular: No hyperdense vessel or unexpected calcification. Skull: Negative for fracture or focal lesion. Sinuses/Orbits: The visualized paranasal sinuses and mastoid air cells are clear. No orbital abnormalities are seen. Other: None. IMPRESSION: Stable unremarkable noncontrast head CT Electronically Signed   By: Richardean Sale M.D.   On: 11/15/2016 13:15   Mr Brain Wo Contrast (neuro Protocol)  Result Date: 11/15/2016 CLINICAL DATA:  Dysphagia. Difficulty speaking with tremors and dizziness 1 day  after beginning fall tearing ninth EXAM: MRI HEAD WITHOUT CONTRAST TECHNIQUE: Multiplanar, multiecho pulse sequences of the brain and surrounding structures were obtained without intravenous contrast. COMPARISON:  CT head from the same day.  MRI brain 07/29/2016. FINDINGS: Brain: No acute infarct, hemorrhage, or mass lesion is present. The ventricles are of normal size. No significant extraaxial fluid collection is present. No significant  white matter disease is present. Internal auditory canals are within normal limits bilaterally. The brainstem and cerebellum are normal. Vascular: Flow is present in the major intracranial arteries. Skull and upper cervical spine: Skullbase is within normal limits. Craniocervical junction is within normal limits. Midline intracranial sagittal structures are normal. Cervical fusion and corpectomy began at C4-5. Sinuses/Orbits: The paranasal sinuses and mastoid air cells are clear. The globes and orbits are within normal limits. IMPRESSION: 1. Normal MRI of the brain. 2. Cervical spine surgery. Electronically Signed   By: San Morelle M.D.   On: 11/15/2016 17:08      TODAY-DAY OF DISCHARGE:  Subjective:   Chucky May today Continues to exhibit dysarthria-at times she has fluent speech (when she is distracted)  Objective:   Blood pressure (!) 162/67, pulse 63, temperature 97.3 F (36.3 C), temperature source Oral, resp. rate 18, height 5\' 5"  (1.651 m), weight 115.7 kg (255 lb), SpO2 98 %. No intake or output data in the 24 hours ending 11/17/16 1138 Filed Weights   11/15/16 1034  Weight: 115.7 kg (255 lb)    Exam: Awake Alert, Oriented *3, No new F.N deficits .AT,PERRAL Supple Neck,No JVD, No cervical lymphadenopathy appriciated.  Symmetrical Chest wall movement, Good air movement bilaterally, CTAB RRR,No Gallops,Rubs or new Murmurs, No Parasternal Heave +ve B.Sounds, Abd Soft, Non tender, No organomegaly appriciated, No rebound -guarding or rigidity. No Cyanosis, Clubbing or edema, No new Rash or bruise   PERTINENT RADIOLOGIC STUDIES: Ct Head Wo Contrast  Result Date: 11/15/2016 CLINICAL DATA:  Dizziness with slurred speech and expressive aphasia for 3 days. EXAM: CT HEAD WITHOUT CONTRAST TECHNIQUE: Contiguous axial images were obtained from the base of the skull through the vertex without intravenous contrast. COMPARISON:  MRI brain 07/29/2016.  CT head 05/22/2015.  FINDINGS: Brain: There is no evidence of acute intracranial hemorrhage, mass lesion, brain edema or extra-axial fluid collection. The ventricles and subarachnoid spaces are appropriately sized for age. There is no CT evidence of acute cortical infarction. Vascular: No hyperdense vessel or unexpected calcification. Skull: Negative for fracture or focal lesion. Sinuses/Orbits: The visualized paranasal sinuses and mastoid air cells are clear. No orbital abnormalities are seen. Other: None. IMPRESSION: Stable unremarkable noncontrast head CT Electronically Signed   By: Richardean Sale M.D.   On: 11/15/2016 13:15   Mr Brain Wo Contrast (neuro Protocol)  Result Date: 11/15/2016 CLINICAL DATA:  Dysphagia. Difficulty speaking with tremors and dizziness 1 day after beginning fall tearing ninth EXAM: MRI HEAD WITHOUT CONTRAST TECHNIQUE: Multiplanar, multiecho pulse sequences of the brain and surrounding structures were obtained without intravenous contrast. COMPARISON:  CT head from the same day.  MRI brain 07/29/2016. FINDINGS: Brain: No acute infarct, hemorrhage, or mass lesion is present. The ventricles are of normal size. No significant extraaxial fluid collection is present. No significant white matter disease is present. Internal auditory canals are within normal limits bilaterally. The brainstem and cerebellum are normal. Vascular: Flow is present in the major intracranial arteries. Skull and upper cervical spine: Skullbase is within normal limits. Craniocervical junction is within normal limits. Midline intracranial sagittal structures are normal. Cervical  fusion and corpectomy began at C4-5. Sinuses/Orbits: The paranasal sinuses and mastoid air cells are clear. The globes and orbits are within normal limits. IMPRESSION: 1. Normal MRI of the brain. 2. Cervical spine surgery. Electronically Signed   By: San Morelle M.D.   On: 11/15/2016 17:08     PERTINENT LAB RESULTS: CBC:  Recent Labs   11/15/16 1255 11/15/16 1313  WBC 10.4  --   HGB 13.0 13.3  HCT 39.6 39.0  PLT 354  --    CMET CMP     Component Value Date/Time   NA 141 11/15/2016 1313   K 4.6 11/15/2016 1313   CL 107 11/15/2016 1313   CO2 25 11/15/2016 1255   GLUCOSE 106 (H) 11/15/2016 1313   BUN 13 11/15/2016 1313   CREATININE 0.70 11/15/2016 1313   CALCIUM 10.2 11/15/2016 1255   PROT 7.2 11/15/2016 1255   ALBUMIN 4.1 11/15/2016 1255   AST 42 (H) 11/15/2016 1255   ALT 48 11/15/2016 1255   ALKPHOS 89 11/15/2016 1255   BILITOT 0.6 11/15/2016 1255   GFRNONAA >60 11/15/2016 1255   GFRAA >60 11/15/2016 1255    GFR Estimated Creatinine Clearance: 99.8 mL/min (by C-G formula based on SCr of 0.7 mg/dL). No results for input(s): LIPASE, AMYLASE in the last 72 hours.  Recent Labs  11/15/16 1917 11/16/16 0039  TROPONINI <0.03 <0.03   Invalid input(s): POCBNP No results for input(s): DDIMER in the last 72 hours. No results for input(s): HGBA1C in the last 72 hours.  Recent Labs  11/16/16 0039  CHOL 282*  HDL 39*  LDLCALC 173*  TRIG 351*  CHOLHDL 7.2    Recent Labs  11/15/16 1917  TSH 1.586   No results for input(s): VITAMINB12, FOLATE, FERRITIN, TIBC, IRON, RETICCTPCT in the last 72 hours. Coags:  Recent Labs  11/15/16 1255  INR 1.00   Microbiology: No results found for this or any previous visit (from the past 240 hour(s)).  FURTHER DISCHARGE INSTRUCTIONS:  Get Medicines reviewed and adjusted: Please take all your medications with you for your next visit with your Primary MD  Laboratory/radiological data: Please request your Primary MD to go over all hospital tests and procedure/radiological results at the follow up, please ask your Primary MD to get all Hospital records sent to his/her office.  In some cases, they will be blood work, cultures and biopsy results pending at the time of your discharge. Please request that your primary care M.D. goes through all the records of your  hospital data and follows up on these results.  Also Note the following: If you experience worsening of your admission symptoms, develop shortness of breath, life threatening emergency, suicidal or homicidal thoughts you must seek medical attention immediately by calling 911 or calling your MD immediately  if symptoms less severe.  You must read complete instructions/literature along with all the possible adverse reactions/side effects for all the Medicines you take and that have been prescribed to you. Take any new Medicines after you have completely understood and accpet all the possible adverse reactions/side effects.   Do not drive when taking Pain medications or sleeping medications (Benzodaizepines)  Do not take more than prescribed Pain, Sleep and Anxiety Medications. It is not advisable to combine anxiety,sleep and pain medications without talking with your primary care practitioner  Special Instructions: If you have smoked or chewed Tobacco  in the last 2 yrs please stop smoking, stop any regular Alcohol  and or any Recreational drug use.  Wear Seat belts while driving.  Please note: You were cared for by a hospitalist during your hospital stay. Once you are discharged, your primary care physician will handle any further medical issues. Please note that NO REFILLS for any discharge medications will be authorized once you are discharged, as it is imperative that you return to your primary care physician (or establish a relationship with a primary care physician if you do not have one) for your post hospital discharge needs so that they can reassess your need for medications and monitor your lab values.  Total Time spent coordinating discharge including counseling, education and face to face time equals 35 minutes.  SignedOren Binet 11/17/2016 11:38 AM

## 2016-11-17 NOTE — Progress Notes (Signed)
Discharge isntr. Given, copy of psychogenic tremors given to pt. Ready for d/c home.husband at bedside.

## 2016-11-22 ENCOUNTER — Ambulatory Visit (HOSPITAL_COMMUNITY)
Admission: RE | Admit: 2016-11-22 | Payer: Commercial Managed Care - HMO | Source: Home / Self Care | Admitting: Psychiatry

## 2016-11-22 DIAGNOSIS — F322 Major depressive disorder, single episode, severe without psychotic features: Secondary | ICD-10-CM | POA: Diagnosis not present

## 2016-11-22 DIAGNOSIS — R471 Dysarthria and anarthria: Secondary | ICD-10-CM | POA: Diagnosis not present

## 2016-11-22 DIAGNOSIS — R251 Tremor, unspecified: Secondary | ICD-10-CM | POA: Diagnosis not present

## 2016-11-29 DIAGNOSIS — F329 Major depressive disorder, single episode, unspecified: Secondary | ICD-10-CM | POA: Diagnosis not present

## 2016-11-29 DIAGNOSIS — R471 Dysarthria and anarthria: Secondary | ICD-10-CM | POA: Diagnosis not present

## 2016-12-27 DIAGNOSIS — G44209 Tension-type headache, unspecified, not intractable: Secondary | ICD-10-CM | POA: Diagnosis not present

## 2016-12-27 DIAGNOSIS — F411 Generalized anxiety disorder: Secondary | ICD-10-CM | POA: Diagnosis not present

## 2016-12-27 DIAGNOSIS — D170 Benign lipomatous neoplasm of skin and subcutaneous tissue of head, face and neck: Secondary | ICD-10-CM | POA: Diagnosis not present

## 2016-12-27 DIAGNOSIS — R251 Tremor, unspecified: Secondary | ICD-10-CM | POA: Diagnosis not present

## 2017-01-10 DIAGNOSIS — R1032 Left lower quadrant pain: Secondary | ICD-10-CM | POA: Diagnosis not present

## 2017-01-10 DIAGNOSIS — Z800 Family history of malignant neoplasm of digestive organs: Secondary | ICD-10-CM | POA: Diagnosis not present

## 2017-01-10 DIAGNOSIS — K589 Irritable bowel syndrome without diarrhea: Secondary | ICD-10-CM | POA: Diagnosis not present

## 2017-01-10 DIAGNOSIS — L299 Pruritus, unspecified: Secondary | ICD-10-CM | POA: Diagnosis not present

## 2017-01-13 DIAGNOSIS — G4730 Sleep apnea, unspecified: Secondary | ICD-10-CM | POA: Diagnosis not present

## 2017-01-13 DIAGNOSIS — Z6841 Body Mass Index (BMI) 40.0 and over, adult: Secondary | ICD-10-CM | POA: Diagnosis not present

## 2017-01-13 DIAGNOSIS — K219 Gastro-esophageal reflux disease without esophagitis: Secondary | ICD-10-CM | POA: Diagnosis not present

## 2017-01-13 DIAGNOSIS — F418 Other specified anxiety disorders: Secondary | ICD-10-CM | POA: Diagnosis not present

## 2017-01-13 DIAGNOSIS — D170 Benign lipomatous neoplasm of skin and subcutaneous tissue of head, face and neck: Secondary | ICD-10-CM | POA: Diagnosis not present

## 2017-01-13 DIAGNOSIS — Z789 Other specified health status: Secondary | ICD-10-CM | POA: Diagnosis not present

## 2017-01-13 DIAGNOSIS — K589 Irritable bowel syndrome without diarrhea: Secondary | ICD-10-CM | POA: Diagnosis not present

## 2017-01-14 ENCOUNTER — Encounter: Payer: Self-pay | Admitting: Cardiology

## 2017-01-14 NOTE — Progress Notes (Signed)
Cardiology Office Note   Date:  01/17/2017   ID:  Paula [name], DOB 08/21/1961, MRN [id number]  PCP:  Reginia Naas, MD  Cardiologist:   Minus Breeding, MD    Chief Complaint  Patient presents with  . Shortness of Breath      History of Present Illness: Paula [name] is a 56 y.o. female who presents for evaluation of palpitations.  She has had these in the past and had a Myoview in 2017 with normal EF and no sichemia.  She was seen in Nov with chest pain that was felt to be atypical.   She had palpitations in the fall and had an event monitor with rare atrial tachycardia.  She returns for follow up.  She continues to describe dyspnea that has been her biggest complaint. She is upset that she doesn't have an etiology identified. She doesn't describe chest discomfort, neck or arm discomfort. She is limited her activities. She's had palpitations but she wore her monitor and there were no significant arrhythmias.  Past Medical History:  Diagnosis Date  . Anxiety   . Chronic bronchitis ([place])   . Chronic lower back pain   . Depression   . Diverticulitis   . Diverticulosis of colon   . Expressive aphasia 11/15/2016  . Fibromyalgia   . GERD (gastroesophageal reflux disease)   . High cholesterol   . History of gastric ulcer   . History of hiatal hernia   . History of kidney stones   . History of thyroid nodule   . Hyperlipidemia   . IBS (irritable bowel syndrome)   . Intermittent vertigo   . Migraine    "a few times/year now; maybe" (11/15/2016)  . OSA on CPAP    wears CPAP nightly ;study in epic 02-01-2014 (11/15/2016)  . Osteoarthritis    "knees, hands, back, hips" (11/15/2016)  . Pneumonia X 1   hx of  . Psychogenic tremor    intermittant head tremor-(11/15/2016)  . Refusal of blood transfusions as patient is Jehovah's Witness   . RLS (restless legs syndrome)   . Urinary incontinence     Past Surgical History:  Procedure Laterality Date  . ABDOMINAL  HYSTERECTOMY  1990  . ANTERIOR CERVICAL DECOMP/DISCECTOMY FUSION  02-10-2004   C4 -- C7  . BACK SURGERY    . CARDIOVASCULAR STRESS TEST  2017   Negative  . CESAREAN SECTION  1977; 1982  . COLONOSCOPY    . CYSTOSCOPY W/ RETROGRADES Right 06/07/2015   Procedure: CYSTOSCOPY WITH RETROGRADE PYELOGRAM;  Surgeon: Ardis Hughs, MD;  Location[facility];  Service: Urology;  Laterality: Right;  . CYSTOSCOPY WITH URETEROSCOPY AND STENT PLACEMENT Right 06/07/2015   Procedure: RIGHT URETEROSCOPY, LASER LITHOTRIPSY, STONE REMOVAL AND RIGHT URETERAL STENT PLACEMENT;  Surgeon: Ardis Hughs, MD;  Location: [facility];  Service: Urology;  Laterality: Right;  . HERNIA REPAIR    . HOLMIUM LASER APPLICATION N/A [id number]   Procedure: HOLMIUM LASER APPLICATION;  Surgeon: Ardis Hughs, MD;  Location[facility];  Service: Urology;  Laterality: N/A;  . LAPAROSCOPIC CHOLECYSTECTOMY  10-07-2000  . UMBILICAL HERNIA REPAIR  age 31  . UPPER GI ENDOSCOPY       Current Outpatient Prescriptions  Medication Sig Dispense Refill  . omeprazole (PRILOSEC) 20 MG capsule Take 20 mg by mouth daily.    . Sertraline HCl (ZOLOFT PO) Take 10 mg by mouth daily.     No current facility-administered  medications for this visit.     Allergies:   Detrol [tolterodine]; Gabapentin; Aspirin; Contrast media [iodinated diagnostic agents]; Hydrocodone-acetaminophen; Imitrex [sumatriptan]; Oxycodone; and Avelox [moxifloxacin]    ROS:  Please see the history of present illness.   Otherwise, review of systems are positive for none.   All other systems are reviewed and negative.    PHYSICAL EXAM: VS:  BP (!) 152/86   Pulse 67   Ht 5\' 5"  (1.651 m)   Wt 246 lb 6.4 oz (111.8 kg)   SpO2 96%   BMI 41.00 kg/m  , BMI Body mass index is 41 kg/m. GENERAL:  Well appearing NECK:  No jugular venous distention, waveform within normal limits, carotid upstroke brisk and symmetric, no  bruits, no thyromegaly LUNGS:  Clear to auscultation bilaterally BACK:  No CVA tenderness CHEST:  Unremarkable HEART:  PMI not displaced or sustained,S1 and S2 within normal limits, no S3, no S4, no clicks, no rubs, no murmurs ABD:  Flat, positive bowel sounds normal in frequency in pitch, no bruits, no rebound, no guarding, no midline pulsatile mass, no hepatomegaly, no splenomegaly EXT:  2 plus pulses throughout, no edema, no cyanosis no clubbing    EKG:  EKG is not ordered today.    Recent Labs: 11/15/2016: ALT 48; BUN 13; Creatinine, Ser 0.70; Hemoglobin 13.3; Platelets 354; Potassium 4.6; Sodium 141; TSH 1.586    Lipid Panel    Component Value Date/Time   CHOL 282 (H) 11/16/2016 0039   TRIG 351 (H) 11/16/2016 0039   HDL 39 (L) 11/16/2016 0039   CHOLHDL 7.2 11/16/2016 0039   VLDL 70 (H) 11/16/2016 0039   LDLCALC 173 (H) 11/16/2016 0039   LDLDIRECT 170.1 01/08/2010 0943      Wt Readings from Last 3 Encounters:  01/16/17 246 lb 6.4 oz (111.8 kg)  11/15/16 255 lb (115.7 kg)  10/09/16 255 lb (115.7 kg)      Other studies Reviewed: Additional studies/ records that were reviewed today include: Hospital record. Review of the above records demonstrates:  Please see elsewhere in the note.     ASSESSMENT AND PLAN:  Palpitations:  she wore a monitor and has had no significant dysrhythmias. No change in therapy or further evaluation is necessary.   Dyspnea:   I don't see any cardiac etiology. She has palpitations and dizziness as well. She's had a pulmonary workup. Cardiac workup is negative. I suggested that this could be panic and this was suggested to her in the past. She'll continue to follow with her primary provider.   HTN:    her blood pressure at home is well controlled. No change in therapy is indicated.    Current medicines are reviewed at length with the patient today.  The patient does not have concerns regarding medicines.  The following changes have been  made:  no change  Labs/ tests ordered today include:  No orders of the defined types were placed in this encounter.    Disposition:   FU with me as needed.     Signed, Minus Breeding, MD  01/17/2017 1:08 PM    [place]

## 2017-01-16 ENCOUNTER — Ambulatory Visit (INDEPENDENT_AMBULATORY_CARE_PROVIDER_SITE_OTHER): Payer: PPO | Admitting: Cardiology

## 2017-01-16 VITALS — BP 152/86 | HR 67 | Ht 65.0 in | Wt 246.4 lb

## 2017-01-16 DIAGNOSIS — R0609 Other forms of dyspnea: Secondary | ICD-10-CM | POA: Diagnosis not present

## 2017-01-16 DIAGNOSIS — R002 Palpitations: Secondary | ICD-10-CM

## 2017-01-16 DIAGNOSIS — R0789 Other chest pain: Secondary | ICD-10-CM | POA: Diagnosis not present

## 2017-01-16 NOTE — Patient Instructions (Signed)
Medication Instructions:  Continue current medications  Labwork: None Ordered  Testing/Procedures: None Ordered  Follow-Up: Your physician recommends that you schedule a follow-up appointment in: As Needed   Any Other Special Instructions Will Be Listed Below (If Applicable).   If you need a refill on your cardiac medications before your next appointment, please call your pharmacy.   

## 2017-01-17 ENCOUNTER — Encounter: Payer: Self-pay | Admitting: Cardiology

## 2017-01-21 DIAGNOSIS — F5101 Primary insomnia: Secondary | ICD-10-CM | POA: Diagnosis not present

## 2017-01-21 DIAGNOSIS — M62838 Other muscle spasm: Secondary | ICD-10-CM | POA: Diagnosis not present

## 2017-01-21 DIAGNOSIS — F411 Generalized anxiety disorder: Secondary | ICD-10-CM | POA: Diagnosis not present

## 2017-01-21 DIAGNOSIS — R251 Tremor, unspecified: Secondary | ICD-10-CM | POA: Diagnosis not present

## 2017-01-22 ENCOUNTER — Telehealth: Payer: Self-pay | Admitting: Pulmonary Disease

## 2017-01-22 DIAGNOSIS — G4733 Obstructive sleep apnea (adult) (pediatric): Secondary | ICD-10-CM

## 2017-01-22 NOTE — Telephone Encounter (Signed)
Spoke with pt and informed her that we are more than happy to place the order. Informed the pt that she does need to make sure that she keeps her follow up appointment with RA in July and she stated that she will. The order was placed nothing further is needed at this time.

## 2017-01-30 HISTORY — PX: LIPOMA EXCISION: SHX5283

## 2017-02-03 DIAGNOSIS — Z800 Family history of malignant neoplasm of digestive organs: Secondary | ICD-10-CM | POA: Diagnosis not present

## 2017-02-03 DIAGNOSIS — R1013 Epigastric pain: Secondary | ICD-10-CM | POA: Diagnosis not present

## 2017-02-03 DIAGNOSIS — R197 Diarrhea, unspecified: Secondary | ICD-10-CM | POA: Diagnosis not present

## 2017-02-03 DIAGNOSIS — R195 Other fecal abnormalities: Secondary | ICD-10-CM | POA: Diagnosis not present

## 2017-02-04 DIAGNOSIS — G4733 Obstructive sleep apnea (adult) (pediatric): Secondary | ICD-10-CM | POA: Diagnosis not present

## 2017-02-05 ENCOUNTER — Ambulatory Visit: Payer: PPO | Admitting: Diagnostic Neuroimaging

## 2017-02-10 ENCOUNTER — Other Ambulatory Visit: Payer: Self-pay | Admitting: General Surgery

## 2017-02-10 DIAGNOSIS — D1739 Benign lipomatous neoplasm of skin and subcutaneous tissue of other sites: Secondary | ICD-10-CM | POA: Diagnosis not present

## 2017-02-10 DIAGNOSIS — D170 Benign lipomatous neoplasm of skin and subcutaneous tissue of head, face and neck: Secondary | ICD-10-CM | POA: Diagnosis not present

## 2017-02-17 ENCOUNTER — Encounter: Payer: Self-pay | Admitting: Diagnostic Neuroimaging

## 2017-02-17 ENCOUNTER — Ambulatory Visit (INDEPENDENT_AMBULATORY_CARE_PROVIDER_SITE_OTHER): Payer: PPO | Admitting: Diagnostic Neuroimaging

## 2017-02-17 VITALS — BP 145/77 | HR 67 | Ht 65.0 in | Wt 246.6 lb

## 2017-02-17 DIAGNOSIS — G4700 Insomnia, unspecified: Secondary | ICD-10-CM | POA: Diagnosis not present

## 2017-02-17 DIAGNOSIS — F444 Conversion disorder with motor symptom or deficit: Secondary | ICD-10-CM | POA: Diagnosis not present

## 2017-02-17 DIAGNOSIS — R270 Ataxia, unspecified: Secondary | ICD-10-CM | POA: Diagnosis not present

## 2017-02-17 DIAGNOSIS — R42 Dizziness and giddiness: Secondary | ICD-10-CM

## 2017-02-17 DIAGNOSIS — F419 Anxiety disorder, unspecified: Secondary | ICD-10-CM

## 2017-02-17 NOTE — Patient Instructions (Signed)
Thank you for coming to see Paula [name] at [facility]. I hope we have been able to provide you high quality care today.  You may receive a patient satisfaction survey over the next few weeks. We would appreciate your feedback and comments so that we may continue to improve ourselves and the health of our patients.  - I will setup physical therapy for balance, walking and conditioning  - follow up with PCP for psychiatry/psychology evaluation   ~~~~~~~~~~~~~~~~~~~~~~~~~~~~~~~~~~~~~~~~~~~~~~~~~~~~~~~~~~~~~~~~~  DR. PENUMALLI'S GUIDE TO HAPPY AND HEALTHY LIVING These are some of my general health and wellness recommendations. Some of them may apply to you better than others. Please use common sense as you try these suggestions and feel free to ask me any questions.   ACTIVITY/FITNESS Mental, social, emotional and physical stimulation are very important for brain and body health. Try learning a new activity (arts, music, language, sports, games).  Keep moving your body to the best of your abilities. You can do this at home, inside or outside, the park, community center, gym or anywhere you like. Consider a physical therapist or personal trainer to get started. Consider the app Sworkit. Fitness trackers such as smart-watches, smart-phones or Fitbits can help as well.   NUTRITION Eat more plants: colorful vegetables, nuts, seeds and berries.  Eat less sugar, salt, preservatives and processed foods.  Avoid toxins such as cigarettes and alcohol.  Drink water when you are thirsty. Warm water with a slice of lemon is an excellent morning drink to start the day.  Consider these websites for more information The Nutrition Source (https://www.henry-hernandez.biz/) Precision Nutrition (WindowBlog.ch)   RELAXATION Consider practicing mindfulness meditation or other relaxation techniques such as deep breathing, prayer, yoga, tai chi, massage.  See website mindful.org or the apps Headspace or Calm to help get started.   SLEEP Try to get at least 7-8+ hours sleep per day. Regular exercise and reduced caffeine will help you sleep better. Practice good sleep hygeine techniques. See website sleep.org for more information.   PLANNING Prepare estate planning, living will, healthcare POA documents. Sometimes this is best planned with the help of an attorney. Theconversationproject.org and agingwithdignity.org are excellent resources.

## 2017-02-17 NOTE — Progress Notes (Signed)
[facility]  PATIENT: Paula [name] DOB: 1961/08/31  REFERRING CLINICIAN:  HISTORY FROM: patient and husband  REASON FOR VISIT: follow up    HISTORICAL  CHIEF COMPLAINT:  Chief Complaint  Patient presents with  . Tremors    rm 7, last seen 06/2015, husband- Juanda Crumble, "Im here for tremors, speech threrapy; they kind of go together"    HISTORY OF PRESENT ILLNESS:   UPDATE 02/17/17: Since last visit tremors continue. Poor sleep (4-5 hrs per night since age 24's). Decr physical activity since past 10 years. Depression and anxiety continue. Also reports history of vertigo attack in 2009.   PRIOR HPI (06/02/15): 56 year old right-handed female here for evaluation of tremors, speech problems, dizziness, since February 2015. Apparently patient had red about a recipe on the Internet of a weight loss cocktail. She mixed lemon, cayenne pepper, vinegar and drank this mixture. The next day she woke up, dizzy, head movements that she could not control, spasms in the neck and shoulders. Patient presented to the emergency room for evaluation. She also had shortness of breath and generalized weakness. CT scan of the head, lab testing, neurology consult, were unremarkable and concern for possible psychogenic tremor was raised. Patient had been under significant stress around this time. Patient then followed up in outpatient neurology clinic, had routine EEG testing, which also was normal. 24-hour ambulatory EEG was performed which showed focal slowing over left temporal region without epileptiform discharges, or echographic seizures or subjective clinical events on patient diary. Patient then had MRI of the brain which also was normal. Patient has tried propranolol, gabapentin, clonazepam without benefit. She was recommended to see a therapist and counselor that she has not been able to go yet. She has history of prior traumatic events which she did not go into detail with me today. Her  husband also suffers from PTSD and he gets treatment through the [facility] hospital. Patient saw another neurologist at [facility], who also concluded that psychogenic etiology was most likely. Patient now presents to me (fourth neurologist opinion) for evaluation.   REVIEW OF SYSTEMS: Full 14 system review of systems performed and negative: OSA on CPAP fatigue activity change double vision cough palpitations snoring neck pain confusion depression anxiety speech diff memory loss dizziness tremors.    ALLERGIES: Allergies  Allergen Reactions  . Detrol [Tolterodine] Other (See Comments)    slurred speech, tremors, dizziness  . Gabapentin Palpitations and Other (See Comments)    Wt gain, tremor  . Aspirin Other (See Comments)    Avoids--- history of gastric ulcers   . Contrast Media [Iodinated Diagnostic Agents] Hives, Itching and Rash    CT contrast-Needed to take benadryl   . Hydrocodone-Acetaminophen Nausea And Vomiting  . Imitrex [Sumatriptan] Other (See Comments)    Body hurts  . Oxycodone Nausea And Vomiting  . Avelox [Moxifloxacin] Itching    HOME MEDICATIONS: Outpatient Medications Prior to Visit  Medication Sig Dispense Refill  . omeprazole (PRILOSEC) 20 MG capsule Take 20 mg by mouth daily.    . Sertraline HCl (ZOLOFT PO) Take 10 mg by mouth daily.     No facility-administered medications prior to visit.     PAST MEDICAL HISTORY: Past Medical History:  Diagnosis Date  . Anxiety   . Chronic bronchitis ([place])   . Chronic lower back pain   . Depression   . Diverticulitis   . Diverticulosis of colon   . Expressive aphasia 11/15/2016  . Fibromyalgia   . GERD (gastroesophageal reflux  disease)   . High cholesterol   . History of gastric ulcer   . History of hiatal hernia   . History of kidney stones   . History of thyroid nodule   . Hyperlipidemia   . IBS (irritable bowel syndrome)   . Intermittent vertigo   . Migraine    "a few times/year now; maybe" (11/15/2016)    . OSA on CPAP    wears CPAP nightly ;study in epic 02-01-2014 (11/15/2016)  . Osteoarthritis    "knees, hands, back, hips" (11/15/2016)  . Pneumonia X 1   hx of  . Psychogenic tremor    intermittant head tremor-(11/15/2016)  . Refusal of blood transfusions as patient is Jehovah's Witness   . RLS (restless legs syndrome)   . Urinary incontinence     PAST SURGICAL HISTORY: Past Surgical History:  Procedure Laterality Date  . ABDOMINAL HYSTERECTOMY  1990  . ANTERIOR CERVICAL DECOMP/DISCECTOMY FUSION  02-10-2004   C4 -- C7  . BACK SURGERY    . CARDIOVASCULAR STRESS TEST  2017   Negative  . CESAREAN SECTION  1977; 1982  . COLONOSCOPY    . CYSTOSCOPY W/ RETROGRADES Right 06/07/2015   Procedure: CYSTOSCOPY WITH RETROGRADE PYELOGRAM;  Surgeon: Ardis Hughs, MD;  Location: [facility];  Service: Urology;  Laterality: Right;  . CYSTOSCOPY WITH URETEROSCOPY AND STENT PLACEMENT Right 06/07/2015   Procedure: RIGHT URETEROSCOPY, LASER LITHOTRIPSY, STONE REMOVAL AND RIGHT URETERAL STENT PLACEMENT;  Surgeon: Ardis Hughs, MD;  Location: [facility];  Service: Urology;  Laterality: Right;  . HERNIA REPAIR    . HOLMIUM LASER APPLICATION N/A 0/0/7121   Procedure: HOLMIUM LASER APPLICATION;  Surgeon: Ardis Hughs, MD;  Location: [facility];  Service: Urology;  Laterality: N/A;  . LAPAROSCOPIC CHOLECYSTECTOMY  10-07-2000  . LIPOMA EXCISION  01/2017   from neck  . UMBILICAL HERNIA REPAIR  age 49  . UPPER GI ENDOSCOPY      FAMILY HISTORY: Family History  Problem Relation Age of Onset  . Colon cancer Maternal Grandfather   . Hypertension Maternal Grandfather   . Cancer Maternal Grandfather     lung  . Hyperlipidemia Mother   . Hypertension Mother   . Cancer Maternal Aunt     lung  . Hypertension Sister   . Hypertension Maternal Grandmother   . Hypertension Brother     SOCIAL HISTORY:  Social History   Social History  .  Marital status: Married    Spouse name: Juanda Crumble  . Number of children: 2  . Years of education: 11   Occupational History  . disabled    Social History Main Topics  . Smoking status: Former Smoker    Packs/day: 0.50    Years: 25.00    Types: Cigarettes    Quit date: 07/24/2008  . Smokeless tobacco: Never Used  . Alcohol use 0.0 oz/week     Comment: 11/15/2016 "might have 1 drink/month; if that"  . Drug use: No  . Sexual activity: Yes   Other Topics Concern  . Not on file   Social History Narrative   Married, lives at home      Caffeine use - coffee, tea once a week      Jehovah's Witness - no blood products     PHYSICAL EXAM  GENERAL EXAM/CONSTITUTIONAL: Vitals:  Vitals:   02/17/17 1005  BP: (!) 145/77  Pulse: 67  Weight: 246 lb 9.6 oz (111.9 kg)  Height: 5\' 5"  (  1.651 m)   Body mass index is 41.04 kg/m. No exam data present  Patient is in no distress; well developed, nourished and groomed; neck is supple; INTERMITTENT HEAD TREMOR; SLOW SPEECH; TREMOR IMPROVES WITH DISTRACTION AND WALKING  CARDIOVASCULAR:  Examination of carotid arteries is normal; no carotid bruits  Regular rate and rhythm, no murmurs  Examination of peripheral vascular system by observation and palpation is normal  EYES:  Ophthalmoscopic exam of optic discs and posterior segments is normal; no papilledema or hemorrhages  MUSCULOSKELETAL:  Gait, strength, tone, movements noted in Neurologic exam below  NEUROLOGIC: MENTAL STATUS:  No flowsheet data found.  awake, alert, oriented to person, place and time  recent and remote memory intact  normal attention and concentration  language fluent, comprehension intact, naming intact,   fund of knowledge appropriate  CRANIAL NERVE:   2nd - no papilledema on fundoscopic exam  2nd, 3rd, 4th, 6th - pupils equal and reactive to light, visual fields full to confrontation, extraocular muscles intact, no nystagmus  5th - facial  sensation symmetric  7th - facial strength symmetric  8th - hearing intact  9th - palate elevates symmetrically, uvula midline  11th - shoulder shrug symmetric  12th - tongue protrusion midline  MOTOR:   normal bulk and tone, full strength in the BUE, BLE  FINE POSTURAL AND ACTION TREMOR IN HANDS  SENSORY:   normal and symmetric to light touch, temperature, vibration  COORDINATION:   finger-nose-finger, fine finger movements normal  REFLEXES:   deep tendon reflexes present and symmetric  GAIT/STATION:   narrow based gait     DIAGNOSTIC DATA (LABS, IMAGING, TESTING) - I reviewed patient records, labs, notes, testing and imaging myself where available.  Lab Results  Component Value Date   WBC 10.4 11/15/2016   HGB 13.3 11/15/2016   HCT 39.0 11/15/2016   MCV 88.0 11/15/2016   PLT 354 11/15/2016      Component Value Date/Time   NA 141 11/15/2016 1313   K 4.6 11/15/2016 1313   CL 107 11/15/2016 1313   CO2 25 11/15/2016 1255   GLUCOSE 106 (H) 11/15/2016 1313   BUN 13 11/15/2016 1313   CREATININE 0.70 11/15/2016 1313   CALCIUM 10.2 11/15/2016 1255   PROT 7.2 11/15/2016 1255   ALBUMIN 4.1 11/15/2016 1255   AST 42 (H) 11/15/2016 1255   ALT 48 11/15/2016 1255   ALKPHOS 89 11/15/2016 1255   BILITOT 0.6 11/15/2016 1255   GFRNONAA >60 11/15/2016 1255   GFRAA >60 11/15/2016 1255   Lab Results  Component Value Date   CHOL 282 (H) 11/16/2016   HDL 39 (L) 11/16/2016   LDLCALC 173 (H) 11/16/2016   LDLDIRECT 170.1 01/08/2010   TRIG 351 (H) 11/16/2016   CHOLHDL 7.2 11/16/2016   Lab Results  Component Value Date   HGBA1C 5.5 11/28/2014   Lab Results  Component Value Date   VITAMINB12 255 11/28/2014   Lab Results  Component Value Date   TSH 1.586 11/15/2016    04/05/14 EEG  - This 24-hour ambulatory EEG study is abnormal due to occasional focal slowing over the left temporal region.Iindicates focal cerebral dysfunction over the left temporal region  suggestive of underlying structural or physiologic abnormality. There were no clear epileptiform discharges or electrographic seizures seen. Patient did not complete diary, push button events did not show electrographic correlate.  05/22/15 MRI brain [I reviewed images myself and agree with interpretation. -VRP]  1. Stable and normal for age noncontrast MRI appearance  of the brain. 2. C3-C4 degenerative spinal stenosis Re identified, see cervical spine MRI 12/18/2014.  05/22/15 CT head [I reviewed images myself and agree with interpretation. -VRP]  - Negative CT head.  12/18/14 MRI lumbar spine [I reviewed images myself and agree with interpretation. -VRP]  - Mild for age lumbar spine degeneration, primarily intermittent facet arthropathy. No acute osseous abnormality, disc herniation, spinal stenosis, or neural impingement.  12/18/14 MRI cervical spine [I reviewed images myself and agree with interpretation. -VRP]  1. Chronic fusion from the C4 to the C7 level. 2. Adjacent segment disease at C3-C4 with chronic disc osteophyte complex, and increased spinal stenosis since 2013. Spinal cord mass effect with no cord signal abnormality. Mild C4 foraminal stenosis also increased.  3. Adjacent segment disease at C7-T1 with mildly increased facet degeneration and stable disc osteophyte complex.  05/25/14 MRI brain [I reviewed images myself and agree with interpretation. -VRP]  1. Negative MRI of the brain. 2. Postsurgical changes of the cervical spine.  04/18/14 MRI brain [I reviewed images myself and agree with interpretation. -VRP]  - No acute intracranial process. Normal MRI of the brain with and without contrast for age, with particular attention to the left temporal lobe.  02/23/14 MRI brain [I reviewed images myself and agree with interpretation. -VRP]  1. Stable, normal noncontrast MRI appearance of the brain. 2. New but primarily mild paranasal sinus inflammatory changes.  11/15/16 MRI  brain [I reviewed images myself and agree with interpretation. -VRP]  1. Normal MRI of the brain. 2. Cervical spine surgery.     ASSESSMENT AND PLAN  56 y.o. year old female here with fluctuating, intermittent tremor and speech difficulties, dizziness, in the setting of significant psychosocial stress, prior traumatic experience, with 3 neurologic consultations diagnosing psychogenic tremor, and 4 normal MRI brain studies since 2015. Lab testing and EEG, MRI of cervical lumbar spine also unremarkable.   Tried and failed gabapentin, propranolol, clonazepam for tremor.  Tried and failed sertraline for depression and anxiety.    Dx: psychogenic tremor/speech disturbance  Psychogenic tremor  Ataxia  Dizziness  Anxiety  Insomnia, unspecified type    PLAN:  I spent 25 minutes of face to face time with patient. Greater than 50% of time was spent in counseling and coordination of care with patient. In summary we discussed:   - In my opinion, review of prior testing and evaluations, I agree with the diagnosis of psychogenic tremor and speech disturbance. I reviewed findings, diagnosis and prognosis with the patient. I strongly encouraged her to explore treatment of ongoing stress as well as prior traumatic experience, through the help of a psychiatrist and psychologist. Patient and husband's questions were answered satisfactorily.  - follow up with psychiatry and psychology  Return if symptoms worsen or fail to improve, for return to PCP.    Penni Bombard, MD 7/48/2707, 86:75 AM Certified in Neurology, Neurophysiology and Neuroimaging  [facility] [address], [place], [place] [address] [phone]

## 2017-02-21 DIAGNOSIS — R1013 Epigastric pain: Secondary | ICD-10-CM | POA: Diagnosis not present

## 2017-02-21 DIAGNOSIS — K2930 Chronic superficial gastritis without bleeding: Secondary | ICD-10-CM | POA: Diagnosis not present

## 2017-02-21 DIAGNOSIS — Z1211 Encounter for screening for malignant neoplasm of colon: Secondary | ICD-10-CM | POA: Diagnosis not present

## 2017-02-21 DIAGNOSIS — Z800 Family history of malignant neoplasm of digestive organs: Secondary | ICD-10-CM | POA: Diagnosis not present

## 2017-02-21 DIAGNOSIS — K635 Polyp of colon: Secondary | ICD-10-CM | POA: Diagnosis not present

## 2017-02-25 DIAGNOSIS — K635 Polyp of colon: Secondary | ICD-10-CM | POA: Diagnosis not present

## 2017-02-25 DIAGNOSIS — R1013 Epigastric pain: Secondary | ICD-10-CM | POA: Diagnosis not present

## 2017-02-25 DIAGNOSIS — K2930 Chronic superficial gastritis without bleeding: Secondary | ICD-10-CM | POA: Diagnosis not present

## 2017-02-25 DIAGNOSIS — Z1211 Encounter for screening for malignant neoplasm of colon: Secondary | ICD-10-CM | POA: Diagnosis not present

## 2017-03-05 DIAGNOSIS — D72829 Elevated white blood cell count, unspecified: Secondary | ICD-10-CM | POA: Diagnosis not present

## 2017-03-05 DIAGNOSIS — R109 Unspecified abdominal pain: Secondary | ICD-10-CM | POA: Diagnosis not present

## 2017-03-05 DIAGNOSIS — R197 Diarrhea, unspecified: Secondary | ICD-10-CM | POA: Diagnosis not present

## 2017-03-05 DIAGNOSIS — R1013 Epigastric pain: Secondary | ICD-10-CM | POA: Diagnosis not present

## 2017-03-06 DIAGNOSIS — R1013 Epigastric pain: Secondary | ICD-10-CM | POA: Diagnosis not present

## 2017-03-12 DIAGNOSIS — A09 Infectious gastroenteritis and colitis, unspecified: Secondary | ICD-10-CM | POA: Diagnosis not present

## 2017-03-12 DIAGNOSIS — R112 Nausea with vomiting, unspecified: Secondary | ICD-10-CM | POA: Diagnosis not present

## 2017-03-13 ENCOUNTER — Encounter: Payer: Self-pay | Admitting: Psychology

## 2017-03-17 DIAGNOSIS — R1013 Epigastric pain: Secondary | ICD-10-CM | POA: Diagnosis not present

## 2017-03-17 DIAGNOSIS — K219 Gastro-esophageal reflux disease without esophagitis: Secondary | ICD-10-CM | POA: Diagnosis not present

## 2017-03-17 DIAGNOSIS — R112 Nausea with vomiting, unspecified: Secondary | ICD-10-CM | POA: Diagnosis not present

## 2017-03-17 DIAGNOSIS — R109 Unspecified abdominal pain: Secondary | ICD-10-CM | POA: Diagnosis not present

## 2017-03-17 DIAGNOSIS — R197 Diarrhea, unspecified: Secondary | ICD-10-CM | POA: Diagnosis not present

## 2017-03-17 DIAGNOSIS — R945 Abnormal results of liver function studies: Secondary | ICD-10-CM | POA: Diagnosis not present

## 2017-03-18 ENCOUNTER — Other Ambulatory Visit: Payer: Self-pay | Admitting: Gastroenterology

## 2017-03-18 ENCOUNTER — Telehealth: Payer: Self-pay | Admitting: Radiology

## 2017-03-18 DIAGNOSIS — R748 Abnormal levels of other serum enzymes: Secondary | ICD-10-CM | POA: Diagnosis not present

## 2017-03-18 DIAGNOSIS — R1013 Epigastric pain: Secondary | ICD-10-CM | POA: Diagnosis not present

## 2017-03-18 NOTE — Telephone Encounter (Signed)
Called 13 hour prep to [facility] on [address] Called pt to explain prep, she had no questions.

## 2017-03-19 DIAGNOSIS — R748 Abnormal levels of other serum enzymes: Secondary | ICD-10-CM | POA: Diagnosis not present

## 2017-03-27 DIAGNOSIS — J069 Acute upper respiratory infection, unspecified: Secondary | ICD-10-CM | POA: Diagnosis not present

## 2017-04-01 ENCOUNTER — Encounter: Payer: Self-pay | Admitting: Psychology

## 2017-04-01 NOTE — Telephone Encounter (Signed)
Prescription for 13 hour prep given to pt and prep explained.

## 2017-04-02 ENCOUNTER — Ambulatory Visit
Admission: RE | Admit: 2017-04-02 | Discharge: 2017-04-02 | Disposition: A | Discharge status: Home / Self Care | Payer: PPO | Source: Ambulatory Visit | Attending: Gastroenterology | Admitting: Gastroenterology

## 2017-04-02 DIAGNOSIS — K760 Fatty (change of) liver, not elsewhere classified: Secondary | ICD-10-CM | POA: Diagnosis not present

## 2017-04-02 DIAGNOSIS — R1013 Epigastric pain: Secondary | ICD-10-CM

## 2017-04-02 MED ORDER — IOPAMIDOL (ISOVUE-300) INJECTION 61%
125.0000 mL | Freq: Once | INTRAVENOUS | Status: AC | PRN
  Administered 2017-04-02: 125 mL via INTRAVENOUS

## 2017-04-09 DIAGNOSIS — K649 Unspecified hemorrhoids: Secondary | ICD-10-CM | POA: Diagnosis not present

## 2017-04-09 DIAGNOSIS — R945 Abnormal results of liver function studies: Secondary | ICD-10-CM | POA: Diagnosis not present

## 2017-04-09 DIAGNOSIS — K6289 Other specified diseases of anus and rectum: Secondary | ICD-10-CM | POA: Diagnosis not present

## 2017-04-09 DIAGNOSIS — R109 Unspecified abdominal pain: Secondary | ICD-10-CM | POA: Diagnosis not present

## 2017-04-09 DIAGNOSIS — K219 Gastro-esophageal reflux disease without esophagitis: Secondary | ICD-10-CM | POA: Diagnosis not present

## 2017-04-18 DIAGNOSIS — M545 Low back pain: Secondary | ICD-10-CM | POA: Diagnosis not present

## 2017-04-21 DIAGNOSIS — M47816 Spondylosis without myelopathy or radiculopathy, lumbar region: Secondary | ICD-10-CM | POA: Diagnosis not present

## 2017-04-24 DIAGNOSIS — M47816 Spondylosis without myelopathy or radiculopathy, lumbar region: Secondary | ICD-10-CM | POA: Diagnosis not present

## 2017-05-06 DIAGNOSIS — M5416 Radiculopathy, lumbar region: Secondary | ICD-10-CM | POA: Diagnosis not present

## 2017-05-06 DIAGNOSIS — Z6839 Body mass index (BMI) 39.0-39.9, adult: Secondary | ICD-10-CM | POA: Diagnosis not present

## 2017-05-06 DIAGNOSIS — F411 Generalized anxiety disorder: Secondary | ICD-10-CM | POA: Diagnosis not present

## 2017-05-09 DIAGNOSIS — M545 Low back pain: Secondary | ICD-10-CM | POA: Diagnosis not present

## 2017-05-19 DIAGNOSIS — M545 Low back pain: Secondary | ICD-10-CM | POA: Diagnosis not present

## 2017-05-22 DIAGNOSIS — G4733 Obstructive sleep apnea (adult) (pediatric): Secondary | ICD-10-CM | POA: Diagnosis not present

## 2017-05-23 DIAGNOSIS — M5416 Radiculopathy, lumbar region: Secondary | ICD-10-CM | POA: Diagnosis not present

## 2017-06-06 DIAGNOSIS — G44209 Tension-type headache, unspecified, not intractable: Secondary | ICD-10-CM | POA: Diagnosis not present

## 2017-06-06 DIAGNOSIS — F444 Conversion disorder with motor symptom or deficit: Secondary | ICD-10-CM | POA: Diagnosis not present

## 2017-06-09 DIAGNOSIS — F444 Conversion disorder with motor symptom or deficit: Secondary | ICD-10-CM | POA: Diagnosis not present

## 2017-06-12 DIAGNOSIS — M545 Low back pain: Secondary | ICD-10-CM | POA: Diagnosis not present

## 2017-06-12 DIAGNOSIS — M5416 Radiculopathy, lumbar region: Secondary | ICD-10-CM | POA: Diagnosis not present

## 2017-07-01 ENCOUNTER — Encounter: Payer: Self-pay | Admitting: Adult Health

## 2017-07-01 ENCOUNTER — Ambulatory Visit (INDEPENDENT_AMBULATORY_CARE_PROVIDER_SITE_OTHER): Payer: PPO | Admitting: Adult Health

## 2017-07-01 DIAGNOSIS — G4733 Obstructive sleep apnea (adult) (pediatric): Secondary | ICD-10-CM | POA: Diagnosis not present

## 2017-07-01 DIAGNOSIS — Z9989 Dependence on other enabling machines and devices: Secondary | ICD-10-CM | POA: Diagnosis not present

## 2017-07-01 NOTE — Patient Instructions (Signed)
Continue on C Pap at bedtime. Keep up the good work. Work on weight loss. Do not drive a sleepy Follow-up with Dr. Elsworth Soho in 1 year and as needed

## 2017-07-01 NOTE — Assessment & Plan Note (Signed)
Wt loss  

## 2017-07-01 NOTE — Progress Notes (Signed)
@Patient  ID: Paula [name], female    DOB: 12/09/1960, 56 y.o.   MRN: [id number]  Chief Complaint  Patient presents with  . Follow-up    OSA    Referring provider: Carol Ada, MD  HPI: 56 year old female followed for sleep apnea and chronic cough   TEST  PSG 2002 and was placed on CPAP 9cm with a nasal mask PSG 01/2014 - AHI 8/h, RDI 2/h , lowest 79% Placed on autoCPAP with pillows ([employer]erocare) >> avg pr 11 cm     PFTs 11/2015  FEV1 at 99%, ratio 92, no significant bronchodilator response, FVC 85%. DLCO 82%.  07/01/2017 Follow up : OSA  Patient returns for one-year follow-up for sleep apnea. Patient says she is doing very well on C Pap at bedtime. She feels rested with no significant daytime sleepiness. She is using her C Pap machine every single night. Download shows excellent compliance with average usage at 8 hours each day. She is on AutoSet 5-12 cm H2O. AHI 0.4. Positive leaks. We discussed wt loss.   Allergies  Allergen Reactions  . Detrol [Tolterodine] Other (See Comments)    slurred speech, tremors, dizziness  . Gabapentin Palpitations and Other (See Comments)    Wt gain, tremor  . Aspirin Other (See Comments)    Avoids--- history of gastric ulcers   . Contrast Media [Iodinated Diagnostic Agents] Hives, Itching and Rash    CT contrast-Needed to take benadryl   . Hydrocodone-Acetaminophen Nausea And Vomiting  . Imitrex [Sumatriptan] Other (See Comments)    Body hurts  . Oxycodone Nausea And Vomiting  . Avelox [Moxifloxacin] Itching    Immunization History  Administered Date(s) Administered  . Influenza,inj,Quad PF,36+ Mos 09/20/2014  . Tdap 12/30/2011    Past Medical History:  Diagnosis Date  . Anxiety   . Chronic bronchitis ([place])   . Chronic lower back pain   . Depression   . Diverticulitis   . Diverticulosis of colon   . Expressive aphasia 11/15/2016  . Fibromyalgia   . GERD (gastroesophageal reflux disease)   . High cholesterol   .  History of gastric ulcer   . History of hiatal hernia   . History of kidney stones   . History of thyroid nodule   . Hyperlipidemia   . IBS (irritable bowel syndrome)   . Intermittent vertigo   . Migraine    "a few times/year now; maybe" (11/15/2016)  . OSA on CPAP    wears CPAP nightly ;study in epic 02-01-2014 (11/15/2016)  . Osteoarthritis    "knees, hands, back, hips" (11/15/2016)  . Pneumonia X 1   hx of  . Psychogenic tremor    intermittant head tremor-(11/15/2016)  . Refusal of blood transfusions as patient is Jehovah's Witness   . RLS (restless legs syndrome)   . Urinary incontinence     Tobacco History: History  Smoking Status  . Former Smoker  . Packs/day: 0.50  . Years: 25.00  . Types: Cigarettes  . Quit date: 07/24/2008  Smokeless Tobacco  . Never Used   Counseling given: Not Answered   Outpatient Encounter Prescriptions as of 07/01/2017  Medication Sig  . pantoprazole (PROTONIX) 40 MG tablet Take 40 mg by mouth 2 (two) times daily before a meal.  . sertraline (ZOLOFT) 50 MG tablet Take 50 mg by mouth daily.  . [DISCONTINUED] omeprazole (PRILOSEC) 20 MG capsule Take 20 mg by mouth daily.  . [DISCONTINUED] Sertraline HCl (ZOLOFT PO) Take 10 mg by mouth daily.  No facility-administered encounter medications on file as of 07/01/2017.      Review of Systems  Constitutional:   No  weight loss, night sweats,  Fevers, chills, fatigue, or  lassitude.  HEENT:   No headaches,  Difficulty swallowing,  Tooth/dental problems, or  Sore throat,                No sneezing, itching, ear ache, nasal congestion, post nasal drip,   CV:  No chest pain,  Orthopnea, PND, swelling in lower extremities, anasarca, dizziness, palpitations, syncope.   GI  No heartburn, indigestion, abdominal pain, nausea, vomiting, diarrhea, change in bowel habits, loss of appetite, bloody stools.   Resp: No shortness of breath with exertion or at rest.  No excess mucus, no productive cough,   No non-productive cough,  No coughing up of blood.  No change in color of mucus.  No wheezing.  No chest wall deformity  Skin: no rash or lesions.  GU: no dysuria, change in color of urine, no urgency or frequency.  No flank pain, no hematuria   MS:  No joint pain or swelling.  No decreased range of motion.  No back pain.    Physical Exam  BP 138/84 (BP Location: Right Arm, Cuff Size: Large)   Pulse 79   Ht 5\' 5"  (1.651 m)   Wt 238 lb (108 kg)   SpO2 100%   BMI 39.61 kg/m   GEN: A/Ox3; pleasant , NAD, obese    HEENT:  [place]/AT,  EACs-clear, TMs-wnl, NOSE-clear, THROAT-clear, no lesions, no postnasal drip or exudate noted.  Class 2-3 MP airway   NECK:  Supple w/ fair ROM; no JVD; normal carotid impulses w/o bruits; no thyromegaly or nodules palpated; no lymphadenopathy.    RESP  Clear  P & A; w/o, wheezes/ rales/ or rhonchi. no accessory muscle use, no dullness to percussion  CARD:  RRR, no m/r/g, no peripheral edema, pulses intact, no cyanosis or clubbing.  GI:   Soft & nt; nml bowel sounds; no organomegaly or masses detected.   Musco: Warm bil, no deformities or joint swelling noted.   Neuro: alert, no focal deficits noted.    Skin: Warm, no lesions or rashes    Lab Results:  CBC   BNP    Component Value Date/Time   BNP 7.6 09/27/2014 1541    ProBNP    Component Value Date/Time   PROBNP 43.6 07/02/2014 2212    Imaging: No results found.   Assessment & Plan:   OSA on CPAP Well controlled on C Pap  Plan  Patient Instructions  Continue on C Pap at bedtime. Keep up the good work. Work on weight loss. Do not drive a sleepy Follow-up with Dr. Elsworth Soho in 1 year and as needed    Obesity Wt loss.      Rexene Edison, NP 07/01/2017

## 2017-07-01 NOTE — Assessment & Plan Note (Signed)
Well controlled on C Pap  Plan  Patient Instructions  Continue on C Pap at bedtime. Keep up the good work. Work on weight loss. Do not drive a sleepy Follow-up with Dr. Elsworth Soho in 1 year and as needed

## 2017-08-25 ENCOUNTER — Other Ambulatory Visit: Payer: Self-pay | Admitting: Family Medicine

## 2017-08-25 DIAGNOSIS — G4733 Obstructive sleep apnea (adult) (pediatric): Secondary | ICD-10-CM | POA: Diagnosis not present

## 2017-08-25 DIAGNOSIS — Z1231 Encounter for screening mammogram for malignant neoplasm of breast: Secondary | ICD-10-CM

## 2017-08-26 DIAGNOSIS — K6289 Other specified diseases of anus and rectum: Secondary | ICD-10-CM | POA: Diagnosis not present

## 2017-08-26 DIAGNOSIS — K219 Gastro-esophageal reflux disease without esophagitis: Secondary | ICD-10-CM | POA: Diagnosis not present

## 2017-08-26 DIAGNOSIS — R109 Unspecified abdominal pain: Secondary | ICD-10-CM | POA: Diagnosis not present

## 2017-08-26 DIAGNOSIS — R945 Abnormal results of liver function studies: Secondary | ICD-10-CM | POA: Diagnosis not present

## 2017-08-26 DIAGNOSIS — H40033 Anatomical narrow angle, bilateral: Secondary | ICD-10-CM | POA: Diagnosis not present

## 2017-08-26 DIAGNOSIS — H04123 Dry eye syndrome of bilateral lacrimal glands: Secondary | ICD-10-CM | POA: Diagnosis not present

## 2017-08-28 ENCOUNTER — Ambulatory Visit
Admission: RE | Admit: 2017-08-28 | Discharge: 2017-08-28 | Disposition: A | Discharge status: Home / Self Care | Payer: PPO | Source: Ambulatory Visit | Attending: Family Medicine | Admitting: Family Medicine

## 2017-08-28 DIAGNOSIS — Z1231 Encounter for screening mammogram for malignant neoplasm of breast: Secondary | ICD-10-CM

## 2017-09-09 DIAGNOSIS — R030 Elevated blood-pressure reading, without diagnosis of hypertension: Secondary | ICD-10-CM | POA: Diagnosis not present

## 2017-09-09 DIAGNOSIS — F411 Generalized anxiety disorder: Secondary | ICD-10-CM | POA: Diagnosis not present

## 2017-09-09 DIAGNOSIS — R51 Headache: Secondary | ICD-10-CM | POA: Diagnosis not present

## 2017-09-19 ENCOUNTER — Other Ambulatory Visit: Payer: Self-pay | Admitting: Gastroenterology

## 2017-09-19 ENCOUNTER — Ambulatory Visit
Admission: RE | Admit: 2017-09-19 | Discharge: 2017-09-19 | Disposition: A | Discharge status: Home / Self Care | Payer: PPO | Source: Ambulatory Visit | Attending: Gastroenterology | Admitting: Gastroenterology

## 2017-09-19 DIAGNOSIS — R140 Abdominal distension (gaseous): Secondary | ICD-10-CM

## 2017-09-19 DIAGNOSIS — R1013 Epigastric pain: Secondary | ICD-10-CM | POA: Diagnosis not present

## 2017-10-10 ENCOUNTER — Encounter (HOSPITAL_COMMUNITY): Payer: Self-pay

## 2017-10-22 DIAGNOSIS — N3281 Overactive bladder: Secondary | ICD-10-CM | POA: Diagnosis not present

## 2017-10-22 DIAGNOSIS — B009 Herpesviral infection, unspecified: Secondary | ICD-10-CM | POA: Diagnosis not present

## 2017-10-22 DIAGNOSIS — N951 Menopausal and female climacteric states: Secondary | ICD-10-CM | POA: Diagnosis not present

## 2017-10-22 DIAGNOSIS — Z01411 Encounter for gynecological examination (general) (routine) with abnormal findings: Secondary | ICD-10-CM | POA: Diagnosis not present

## 2017-10-22 DIAGNOSIS — Z6841 Body Mass Index (BMI) 40.0 and over, adult: Secondary | ICD-10-CM | POA: Diagnosis not present

## 2018-01-23 DIAGNOSIS — M79602 Pain in left arm: Secondary | ICD-10-CM | POA: Diagnosis not present

## 2018-01-23 DIAGNOSIS — R42 Dizziness and giddiness: Secondary | ICD-10-CM | POA: Diagnosis not present

## 2018-01-23 DIAGNOSIS — M25532 Pain in left wrist: Secondary | ICD-10-CM | POA: Diagnosis not present

## 2018-02-06 DIAGNOSIS — S46012D Strain of muscle(s) and tendon(s) of the rotator cuff of left shoulder, subsequent encounter: Secondary | ICD-10-CM | POA: Diagnosis not present

## 2018-02-06 DIAGNOSIS — M25512 Pain in left shoulder: Secondary | ICD-10-CM | POA: Diagnosis not present

## 2018-02-06 DIAGNOSIS — M542 Cervicalgia: Secondary | ICD-10-CM | POA: Diagnosis not present

## 2018-02-10 DIAGNOSIS — Z6841 Body Mass Index (BMI) 40.0 and over, adult: Secondary | ICD-10-CM | POA: Diagnosis not present

## 2018-02-10 DIAGNOSIS — G5702 Lesion of sciatic nerve, left lower limb: Secondary | ICD-10-CM | POA: Diagnosis not present

## 2018-02-10 DIAGNOSIS — M5440 Lumbago with sciatica, unspecified side: Secondary | ICD-10-CM | POA: Diagnosis not present

## 2018-02-13 ENCOUNTER — Other Ambulatory Visit: Payer: Self-pay | Admitting: *Deleted

## 2018-02-13 NOTE — Patient Outreach (Signed)
MD office made a direct request for me to do an annual wellness exam on this pt some time ago, however, Mrs. Adelstein declined at the time and said she would get back in touch, which I did not hear from her.  Today, I reached out to her and she says she has many issues she needs help with. Our schedules do not allow for a home visit from me until mid April.  I have advised Ms. Henken of this and am making that referral today.  I will call her on April 8th to schedule her AWV.  Eulah Pont. Myrtie Neither, MSN, [facility] Gerontological Nurse Practitioner [facility] Care Management [phone]

## 2018-02-16 ENCOUNTER — Other Ambulatory Visit: Payer: Self-pay

## 2018-02-16 NOTE — Patient Outreach (Signed)
[facility]) Care Management  02/16/2018  Paula [name] 04/02/1961 [id number]   Telephone Screen  Referral Date: 02/16/18 Referral Source: Deloria Lair, [facility] NP Referral Reason: "multiple issues she needs help with" Insurance: HTA   Outreach attempt # 1 to patient. Patient reported she was in the store at present and could not talk right now. She voices she will call RN CM back later.      Plan: RN CM will make outreach attempt to patient within three business days if no return call.    Enzo Montgomery, RN,BSN,CCM [place] [facility] Telephonic Care Management Coordinator Direct Phone: [phone] Toll Free: [phone] Fax: [phone]

## 2018-02-19 ENCOUNTER — Other Ambulatory Visit: Payer: Self-pay

## 2018-02-19 NOTE — Patient Outreach (Signed)
[facility]) Care Management  02/19/2018  SHANNEL ZAHM 1961-07-12 [id number]   Telephone Screen  Referral Date: 02/16/18 Referral Source: Deloria Lair, [facility] NP Referral Reason: "multiple issues she needs help with" Insurance: HTA    Outreach attempt #2 to patient. Phone went straight to voicemail.      Plan: RN CM will make outreach attempt to patient within three to four business days. RN CM will send unsuccessful outreach letter to patient.   Enzo Montgomery, RN,BSN,CCM [place] Management Telephonic Care Management Coordinator Direct Phone: [phone] Toll Free: [phone] Fax: [phone]

## 2018-02-25 ENCOUNTER — Other Ambulatory Visit: Payer: Self-pay

## 2018-02-25 DIAGNOSIS — N951 Menopausal and female climacteric states: Secondary | ICD-10-CM | POA: Diagnosis not present

## 2018-02-25 DIAGNOSIS — N3281 Overactive bladder: Secondary | ICD-10-CM | POA: Diagnosis not present

## 2018-02-25 DIAGNOSIS — Z6841 Body Mass Index (BMI) 40.0 and over, adult: Secondary | ICD-10-CM | POA: Diagnosis not present

## 2018-02-25 NOTE — Patient Outreach (Signed)
[facility]) Care Management  02/25/2018  Paula [name] 12/28/1960 [id number]   Telephone Screen  Referral Date: 02/16/18 Referral Source: Deloria Lair, [facility] NP Referral Reason: "multiple issues she needs help with" Insurance: HTA    Outreach attempt #3 to patient.  No answer at present and unable to leave message.     Plan: RN CM has already sent unsuccessful outreach letter to patient and will close case if no response from patient.     Enzo Montgomery, RN,BSN,CCM [place] Management Telephonic Care Management Coordinator Direct Phone: [phone] Toll Free: [phone] Fax: [phone]

## 2018-02-26 DIAGNOSIS — N3946 Mixed incontinence: Secondary | ICD-10-CM | POA: Diagnosis not present

## 2018-02-27 ENCOUNTER — Other Ambulatory Visit: Payer: Self-pay

## 2018-02-27 DIAGNOSIS — G4733 Obstructive sleep apnea (adult) (pediatric): Secondary | ICD-10-CM | POA: Diagnosis not present

## 2018-02-27 NOTE — Patient Outreach (Signed)
Paula [name]) Care Management  02/27/2018  Paula [name] 1961/01/08 [id number]   Telephone Screen  Referral Date:02/16/18 Referral Source:Carroll Spinks, [facility] NP Referral Reason:"multiple issues she needs help with" Insurance:HTA     Multiple attempts to establish contact with patient without success. No response from letter mailed to patient. Case is being closed at this time.     Plan: RN CM will close case at this time.     Enzo Montgomery, RN,BSN,CCM [place] Management Telephonic Care Management Coordinator Direct Phone: [phone] Toll Free: [phone] Fax: [phone]

## 2018-03-02 DIAGNOSIS — R351 Nocturia: Secondary | ICD-10-CM | POA: Diagnosis not present

## 2018-03-02 DIAGNOSIS — M9904 Segmental and somatic dysfunction of sacral region: Secondary | ICD-10-CM | POA: Diagnosis not present

## 2018-03-02 DIAGNOSIS — M6281 Muscle weakness (generalized): Secondary | ICD-10-CM | POA: Diagnosis not present

## 2018-03-02 DIAGNOSIS — R350 Frequency of micturition: Secondary | ICD-10-CM | POA: Diagnosis not present

## 2018-03-02 DIAGNOSIS — M62838 Other muscle spasm: Secondary | ICD-10-CM | POA: Diagnosis not present

## 2018-03-02 DIAGNOSIS — R3915 Urgency of urination: Secondary | ICD-10-CM | POA: Diagnosis not present

## 2018-03-02 DIAGNOSIS — N3946 Mixed incontinence: Secondary | ICD-10-CM | POA: Diagnosis not present

## 2018-03-02 DIAGNOSIS — M5432 Sciatica, left side: Secondary | ICD-10-CM | POA: Diagnosis not present

## 2018-03-02 DIAGNOSIS — M5416 Radiculopathy, lumbar region: Secondary | ICD-10-CM | POA: Diagnosis not present

## 2018-03-09 DIAGNOSIS — M62838 Other muscle spasm: Secondary | ICD-10-CM | POA: Diagnosis not present

## 2018-03-09 DIAGNOSIS — R350 Frequency of micturition: Secondary | ICD-10-CM | POA: Diagnosis not present

## 2018-03-09 DIAGNOSIS — M6289 Other specified disorders of muscle: Secondary | ICD-10-CM | POA: Diagnosis not present

## 2018-03-09 DIAGNOSIS — R351 Nocturia: Secondary | ICD-10-CM | POA: Diagnosis not present

## 2018-03-09 DIAGNOSIS — N3946 Mixed incontinence: Secondary | ICD-10-CM | POA: Diagnosis not present

## 2018-03-09 DIAGNOSIS — M6281 Muscle weakness (generalized): Secondary | ICD-10-CM | POA: Diagnosis not present

## 2018-03-10 DIAGNOSIS — M5442 Lumbago with sciatica, left side: Secondary | ICD-10-CM | POA: Diagnosis not present

## 2018-03-13 DIAGNOSIS — M5442 Lumbago with sciatica, left side: Secondary | ICD-10-CM | POA: Diagnosis not present

## 2018-03-16 DIAGNOSIS — M5442 Lumbago with sciatica, left side: Secondary | ICD-10-CM | POA: Diagnosis not present

## 2018-03-17 ENCOUNTER — Other Ambulatory Visit: Payer: Self-pay | Admitting: *Deleted

## 2018-03-17 DIAGNOSIS — F329 Major depressive disorder, single episode, unspecified: Secondary | ICD-10-CM | POA: Insufficient documentation

## 2018-03-17 DIAGNOSIS — G894 Chronic pain syndrome: Secondary | ICD-10-CM

## 2018-03-17 NOTE — Patient Outreach (Signed)
HTA Day Visit  Name: Paula [name]     DOB: 15-Nov-1961     MRN: [id number]  Paula [name] is a 57 y.o. female who presents for Medicare Annual  preventive examination.  Cardiac Risk Factors include: dyslipidemia;obesity (BMI >30kg/m2);sedentary lifestyle;smoking/ tobacco exposure  Today's Vitals   03/17/18 1455 03/17/18 1459  BP: 140/90   Pulse: 72   Resp: 20   SpO2: 98%   Weight: 250 lb (113.4 kg)   Height: 1.651 m (5\' 5" )   PainSc:  4    Body mass index is 41.6 kg/m.  Tobacco Social History   Tobacco Use  Smoking Status Former Smoker  . Packs/day: 0.50  . Years: 25.00  . Pack years: 12.50  . Types: Cigarettes  . Last attempt to quit: 07/24/2008  . Years since quitting: 9.6  Smokeless Tobacco Never Used     Counseling given: Not Answered   Advanced Directive information Does Patient Have a Medical Advance Directive?: No, Would patient like information on creating a medical advance directive?: Yes (MAU/Ambulatory/Procedural Areas - Information given)  Contact Information    Name Relation Home Work Mobile   Paula [name] Spouse   [place] Daughter   [phone]      Past Medical History:  Diagnosis Date  . Anxiety   . Chronic bronchitis ([place])   . Chronic lower back pain   . Depression   . Diverticulitis   . Diverticulosis of colon   . Expressive aphasia 11/15/2016  . Fibromyalgia   . GERD (gastroesophageal reflux disease)   . High cholesterol   . History of gastric ulcer   . History of hiatal hernia   . History of kidney stones   . History of thyroid nodule   . Hyperlipidemia   . IBS (irritable bowel syndrome)   . Intermittent vertigo   . Migraine    "a few times/year now; maybe" (11/15/2016)  . OSA on CPAP    wears CPAP nightly ;study in epic 02-01-2014 (11/15/2016)  . Osteoarthritis    "knees, hands, back, hips" (11/15/2016)  . Pneumonia X 1   hx of  . Psychogenic tremor    intermittant head tremor-(11/15/2016)   . Refusal of blood transfusions as patient is Jehovah's Witness   . RLS (restless legs syndrome)   . Urinary incontinence    Past Surgical History:  Procedure Laterality Date  . ABDOMINAL HYSTERECTOMY  1990  . ANTERIOR CERVICAL DECOMP/DISCECTOMY FUSION  02-10-2004   C4 -- C7  . BACK SURGERY    . CARDIOVASCULAR STRESS TEST  2017   Negative  . CESAREAN SECTION  1977; 1982  . COLONOSCOPY    . CYSTOSCOPY W/ RETROGRADES Right 06/07/2015   Procedure: CYSTOSCOPY WITH RETROGRADE PYELOGRAM;  Surgeon: Ardis Hughs, MD;  Location: [facility];  Service: Urology;  Laterality: Right;  . CYSTOSCOPY WITH URETEROSCOPY AND STENT PLACEMENT Right 06/07/2015   Procedure: RIGHT URETEROSCOPY, LASER LITHOTRIPSY, STONE REMOVAL AND RIGHT URETERAL STENT PLACEMENT;  Surgeon: Ardis Hughs, MD;  Location: [facility];  Service: Urology;  Laterality: Right;  . HERNIA REPAIR    . HOLMIUM LASER APPLICATION N/A 04/07/8468   Procedure: HOLMIUM LASER APPLICATION;  Surgeon: Ardis Hughs, MD;  Location: [facility];  Service: Urology;  Laterality: N/A;  . LAPAROSCOPIC CHOLECYSTECTOMY  10-07-2000  . LIPOMA EXCISION  01/2017   from neck  . UMBILICAL HERNIA REPAIR  age 49  . UPPER GI ENDOSCOPY  Family History  Problem Relation Age of Onset  . Colon cancer Maternal Grandfather   . Hypertension Maternal Grandfather   . Cancer Maternal Grandfather        lung  . Hyperlipidemia Mother   . Hypertension Mother   . Cancer Maternal Aunt        lung  . Hypertension Sister   . Hypertension Maternal Grandmother   . Hypertension Brother    Social History   Substance and Sexual Activity  Sexual Activity Yes    Outpatient Encounter Medications as of 03/17/2018  Medication Sig  . dicyclomine (BENTYL) 10 MG capsule Take 10 mg by mouth 4 (four) times daily -  before meals and at bedtime.  . meclizine (ANTIVERT) 25 MG tablet Take 25 mg by mouth.  . tolterodine  (DETROL) 1 MG tablet Take 1 mg by mouth 2 (two) times daily.  . traMADol (ULTRAM) 50 MG tablet Take 50 mg by mouth every 6 (six) hours as needed for moderate pain.  . pantoprazole (PROTONIX) 40 MG tablet Take 40 mg by mouth 2 (two) times daily before a meal.  . [DISCONTINUED] sertraline (ZOLOFT) 50 MG tablet Take 50 mg by mouth daily.   No facility-administered encounter medications on file as of 03/17/2018.     Activities of Daily Living In your present state of health, do you have any difficulty performing the following activities: 03/17/2018  Hearing? N  Vision? N  Difficulty concentrating or making decisions? N  Walking or climbing stairs? N  Dressing or bathing? N  Doing errands, shopping? N  Preparing Food and eating ? N  Using the Toilet? N  In the past six months, have you accidently leaked urine? Y  Do you have problems with loss of bowel control? Y  Managing your Medications? N  Managing your Finances? N  Housekeeping or managing your Housekeeping? N  Some recent data might be hidden    Patient Care Team: Carol Ada, MD as PCP - General (Family Medicine) Irene Shipper, MD (Gastroenterology) Ashok Pall, MD (Neurosurgery) Amie Portland, MD as Consulting Physician (Neurology)  Diagnosis:    Depression Anxiety IBS Chronic pain GERD Hyperlipidmia  Exercise Activities and Dietary recommendations Exercise limited by: psychological condition(s);orthopedic condition(s)(urology and muscular skeletal pain)  Goals      Patient Stated   . DIET - INCREASE LEAN PROTEINS (pt-stated)    . Weight (lb) < 200 lb (90.7 kg) (pt-stated)     I will reduce my weight by 5 pounds over the next 3 months.      Other   . Exercise 3x per week (30 min per time)     I will walk 20 minutes in my neighborhood 3 times a week.    . Patient Stated     I will talk with the [facility] LCSW for my well-being.    . Patient Stated     I will talk with Dr. Tamala Julian about my cholesterol  control.      Fall Risk Fall Risk  03/17/2018 06/02/2015  Falls in the past year? No No  Risk for fall due to : History of fall(s);Impaired balance/gait -   Safety Do you have grab bars in the bathroom?: No Does your home have working smoke alarms?: Yes Do you consisitantly wear a seat belt?: Yes Depression Screen PHQ 2/9 Scores 03/17/2018  PHQ - 2 Score 2  PHQ- 9 Score 13    Immunization History  Administered Date(s) Administered  . Influenza,inj,Quad PF,6+ Mos  09/20/2014  . Tdap 12/30/2011   Screening Tests Health Maintenance  Topic Date Due  . Hepatitis C Screening  [birth year]  . HIV Screening  05/31/1975  . PAP SMEAR  06/13/2017  . INFLUENZA VACCINE  07/02/2018  . MAMMOGRAM  08/29/2019  . COLONOSCOPY  06/25/2021  . TETANUS/TDAP  12/29/2021       Plan:    See goals above.  I have personally reviewed and noted the following in the patient's chart:   . Medical and social history . Use of alcohol, tobacco or illicit drugs  . Current medications and supplements . Functional ability and status . Nutritional status . Physical activity . Advanced directives . List of other physicians . Hospitalizations, surgeries, and ER visits in previous 12 months . Vitals . Screenings to include cognitive, depression, and falls . Referrals and appointments  In addition, I have reviewed and discussed with patient certain preventive protocols, quality metrics, and best practice recommendations. A written personalized care plan for preventive services as well as general preventive health recommendations were provided to patient.     Eulah Pont. Jacynda Brunke, MSN, GNP-BC  03/17/2018 Gerontological Nurse Practitioner [facility] Care Management

## 2018-03-17 NOTE — Addendum Note (Signed)
Addended by: Deloria Lair on: 03/17/2018 05:39 PM   Modules accepted: Orders

## 2018-03-17 NOTE — Patient Instructions (Addendum)
Preventive Care 40-64 Years, Female Preventive care refers to lifestyle choices and visits with your health care provider that can promote health and wellness. What does preventive care include?  A yearly physical exam. This is also called an annual well check.  Dental exams once or twice a year.  Routine eye exams. Ask your health care provider how often you should have your eyes checked.  Personal lifestyle choices, including: ? Daily care of your teeth and gums. ? Regular physical activity. ? Eating a healthy diet. ? Avoiding tobacco and drug use. ? Limiting alcohol use. ? Practicing safe sex. ? Taking low-dose aspirin daily starting at age 58. ? Taking vitamin and mineral supplements as recommended by your health care provider. What happens during an annual well check? The services and screenings done by your health care provider during your annual well check will depend on your age, overall health, lifestyle risk factors, and family history of disease. Counseling Your health care provider may ask you questions about your:  Alcohol use.  Tobacco use.  Drug use.  Emotional well-being.  Home and relationship well-being.  Sexual activity.  Eating habits.  Work and work Statistician.  Method of birth control.  Menstrual cycle.  Pregnancy history.  Screening You may have the following tests or measurements:  Height, weight, and BMI.  Blood pressure.  Lipid and cholesterol levels. These may be checked every 5 years, or more frequently if you are over 81 years old.  Skin check.  Lung cancer screening. You may have this screening every year starting at age 78 if you have a 30-pack-year history of smoking and currently smoke or have quit within the past 15 years.  Fecal occult blood test (FOBT) of the stool. You may have this test every year starting at age 65.  Flexible sigmoidoscopy or colonoscopy. You may have a sigmoidoscopy every 5 years or a colonoscopy  every 10 years starting at age 30.  Hepatitis C blood test.  Hepatitis B blood test.  Sexually transmitted disease (STD) testing.  Diabetes screening. This is done by checking your blood sugar (glucose) after you have not eaten for a while (fasting). You may have this done every 1-3 years.  Mammogram. This may be done every 1-2 years. Talk to your health care provider about when you should start having regular mammograms. This may depend on whether you have a family history of breast cancer.  BRCA-related cancer screening. This may be done if you have a family history of breast, ovarian, tubal, or peritoneal cancers.  Pelvic exam and Pap test. This may be done every 3 years starting at age 80. Starting at age 36, this may be done every 5 years if you have a Pap test in combination with an HPV test.  Bone density scan. This is done to screen for osteoporosis. You may have this scan if you are at high risk for osteoporosis.  Discuss your test results, treatment options, and if necessary, the need for more tests with your health care provider. Vaccines Your health care provider may recommend certain vaccines, such as:  Influenza vaccine. This is recommended every year.  Tetanus, diphtheria, and acellular pertussis (Tdap, Td) vaccine. You may need a Td booster every 10 years.  Varicella vaccine. You may need this if you have not been vaccinated.  Zoster vaccine. You may need this after age 5.  Measles, mumps, and rubella (MMR) vaccine. You may need at least one dose of MMR if you were born in  1957 or later. You may also need a second dose.  Pneumococcal 13-valent conjugate (PCV13) vaccine. You may need this if you have certain conditions and were not previously vaccinated.  Pneumococcal polysaccharide (PPSV23) vaccine. You may need one or two doses if you smoke cigarettes or if you have certain conditions.  Meningococcal vaccine. You may need this if you have certain  conditions.  Hepatitis A vaccine. You may need this if you have certain conditions or if you travel or work in places where you may be exposed to hepatitis A.  Hepatitis B vaccine. You may need this if you have certain conditions or if you travel or work in places where you may be exposed to hepatitis B.  Haemophilus influenzae type b (Hib) vaccine. You may need this if you have certain conditions.  Talk to your health care provider about which screenings and vaccines you need and how often you need them. This information is not intended to replace advice given to you by your health care provider. Make sure you discuss any questions you have with your health care provider. Document Released: 12/15/2015 Document Revised: 08/07/2016 Document Reviewed: 09/19/2015 Elsevier Interactive Patient Education  2018 [place] Maintenance, Female Adopting a healthy lifestyle and getting preventive care can go a long way to promote health and wellness. Talk with your health care provider about what schedule of regular examinations is right for you. This is a good chance for you to check in with your provider about disease prevention and staying healthy. In between checkups, there are plenty of things you can do on your own. Experts have done a lot of research about which lifestyle changes and preventive measures are most likely to keep you healthy. Ask your health care provider for more information. Weight and diet Eat a healthy diet  Be sure to include plenty of vegetables, fruits, low-fat dairy products, and lean protein.  Do not eat a lot of foods high in solid fats, added sugars, or salt.  Get regular exercise. This is one of the most important things you can do for your health. ? Most adults should exercise for at least 150 minutes each week. The exercise should increase your heart rate and make you sweat (moderate-intensity exercise). ? Most adults should also do strengthening exercises  at least twice a week. This is in addition to the moderate-intensity exercise.  Maintain a healthy weight  Body mass index (BMI) is a measurement that can be used to identify possible weight problems. It estimates body fat based on height and weight. Your health care provider can help determine your BMI and help you achieve or maintain a healthy weight.  For females 24 years of age and older: ? A BMI below 18.5 is considered underweight. ? A BMI of 18.5 to 24.9 is normal. ? A BMI of 25 to 29.9 is considered overweight. ? A BMI of 30 and above is considered obese.  Watch levels of cholesterol and blood lipids  You should start having your blood tested for lipids and cholesterol at 58 years of age, then have this test every 5 years.  You may need to have your cholesterol levels checked more often if: ? Your lipid or cholesterol levels are high. ? You are older than 58 years of age. ? You are at high risk for heart disease.  Cancer screening Lung Cancer  Lung cancer screening is recommended for adults 29-32 years old who are at high risk for lung cancer because of  a history of smoking.  A yearly low-dose CT scan of the lungs is recommended for people who: ? Currently smoke. ? Have quit within the past 15 years. ? Have at least a 30-pack-year history of smoking. A pack year is smoking an average of one pack of cigarettes a day for 1 year.  Yearly screening should continue until it has been 15 years since you quit.  Yearly screening should stop if you develop a health problem that would prevent you from having lung cancer treatment.  Breast Cancer  Practice breast self-awareness. This means understanding how your breasts normally appear and feel.  It also means doing regular breast self-exams. Let your health care provider know about any changes, no matter how small.  If you are in your 20s or 30s, you should have a clinical breast exam (CBE) by a health care provider every 1-3  years as part of a regular health exam.  If you are 28 or older, have a CBE every year. Also consider having a breast X-ray (mammogram) every year.  If you have a family history of breast cancer, talk to your health care provider about genetic screening.  If you are at high risk for breast cancer, talk to your health care provider about having an MRI and a mammogram every year.  Breast cancer gene (BRCA) assessment is recommended for women who have family members with BRCA-related cancers. BRCA-related cancers include: ? Breast. ? Ovarian. ? Tubal. ? Peritoneal cancers.  Results of the assessment will determine the need for genetic counseling and BRCA1 and BRCA2 testing.  Cervical Cancer Your health care provider may recommend that you be screened regularly for cancer of the pelvic organs (ovaries, uterus, and vagina). This screening involves a pelvic examination, including checking for microscopic changes to the surface of your cervix (Pap test). You may be encouraged to have this screening done every 3 years, beginning at age 24.  For women ages 32-65, health care providers may recommend pelvic exams and Pap testing every 3 years, or they may recommend the Pap and pelvic exam, combined with testing for human papilloma virus (HPV), every 5 years. Some types of HPV increase your risk of cervical cancer. Testing for HPV may also be done on women of any age with unclear Pap test results.  Other health care providers may not recommend any screening for nonpregnant women who are considered low risk for pelvic cancer and who do not have symptoms. Ask your health care provider if a screening pelvic exam is right for you.  If you have had past treatment for cervical cancer or a condition that could lead to cancer, you need Pap tests and screening for cancer for at least 20 years after your treatment. If Pap tests have been discontinued, your risk factors (such as having a new sexual partner) need to  be reassessed to determine if screening should resume. Some women have medical problems that increase the chance of getting cervical cancer. In these cases, your health care provider may recommend more frequent screening and Pap tests.  Colorectal Cancer  This type of cancer can be detected and often prevented.  Routine colorectal cancer screening usually begins at 58 years of age and continues through 58 years of age.  Your health care provider may recommend screening at an earlier age if you have risk factors for colon cancer.  Your health care provider may also recommend using home test kits to check for hidden blood in the stool.  A  small camera at the end of a tube can be used to examine your colon directly (sigmoidoscopy or colonoscopy). This is done to check for the earliest forms of colorectal cancer.  Routine screening usually begins at age 72.  Direct examination of the colon should be repeated every 5-10 years through 58 years of age. However, you may need to be screened more often if early forms of precancerous polyps or small growths are found.  Skin Cancer  Check your skin from head to toe regularly.  Tell your health care provider about any new moles or changes in moles, especially if there is a change in a mole's shape or color.  Also tell your health care provider if you have a mole that is larger than the size of a pencil eraser.  Always use sunscreen. Apply sunscreen liberally and repeatedly throughout the day.  Protect yourself by wearing long sleeves, pants, a wide-brimmed hat, and sunglasses whenever you are outside.  Heart disease, diabetes, and high blood pressure  High blood pressure causes heart disease and increases the risk of stroke. High blood pressure is more likely to develop in: ? People who have blood pressure in the high end of the normal range (130-139/85-89 mm Hg). ? People who are overweight or obese. ? People who are African American.  If  you are 47-42 years of age, have your blood pressure checked every 3-5 years. If you are 61 years of age or older, have your blood pressure checked every year. You should have your blood pressure measured twice-once when you are at a hospital or clinic, and once when you are not at a hospital or clinic. Record the average of the two measurements. To check your blood pressure when you are not at a hospital or clinic, you can use: ? An automated blood pressure machine at a pharmacy. ? A home blood pressure monitor.  If you are between 63 years and 33 years old, ask your health care provider if you should take aspirin to prevent strokes.  Have regular diabetes screenings. This involves taking a blood sample to check your fasting blood sugar level. ? If you are at a normal weight and have a low risk for diabetes, have this test once every three years after 58 years of age. ? If you are overweight and have a high risk for diabetes, consider being tested at a younger age or more often. Preventing infection Hepatitis B  If you have a higher risk for hepatitis B, you should be screened for this virus. You are considered at high risk for hepatitis B if: ? You were born in a country where hepatitis B is common. Ask your health care provider which countries are considered high risk. ? Your parents were born in a high-risk country, and you have not been immunized against hepatitis B (hepatitis B vaccine). ? You have HIV or AIDS. ? You use needles to inject street drugs. ? You live with someone who has hepatitis B. ? You have had sex with someone who has hepatitis B. ? You get hemodialysis treatment. ? You take certain medicines for conditions, including cancer, organ transplantation, and autoimmune conditions.  Hepatitis C  Blood testing is recommended for: ? Everyone born from [birth year] through 1966. ? Anyone with known risk factors for hepatitis C.  Sexually transmitted infections (STIs)  You should  be screened for sexually transmitted infections (STIs) including gonorrhea and chlamydia if: ? You are sexually active and are younger than 58 years  of age. ? You are older than 58 years of age and your health care provider tells you that you are at risk for this type of infection. ? Your sexual activity has changed since you were last screened and you are at an increased risk for chlamydia or gonorrhea. Ask your health care provider if you are at risk.  If you do not have HIV, but are at risk, it may be recommended that you take a prescription medicine daily to prevent HIV infection. This is called pre-exposure prophylaxis (PrEP). You are considered at risk if: ? You are sexually active and do not regularly use condoms or know the HIV status of your partner(s). ? You take drugs by injection. ? You are sexually active with a partner who has HIV.  Talk with your health care provider about whether you are at high risk of being infected with HIV. If you choose to begin PrEP, you should first be tested for HIV. You should then be tested every 3 months for as long as you are taking PrEP. Pregnancy  If you are premenopausal and you may become pregnant, ask your health care provider about preconception counseling.  If you may become pregnant, take 400 to 800 micrograms (mcg) of folic acid every day.  If you want to prevent pregnancy, talk to your health care provider about birth control (contraception). Osteoporosis and menopause  Osteoporosis is a disease in which the bones lose minerals and strength with aging. This can result in serious bone fractures. Your risk for osteoporosis can be identified using a bone density scan.  If you are 60 years of age or older, or if you are at risk for osteoporosis and fractures, ask your health care provider if you should be screened.  Ask your health care provider whether you should take a calcium or vitamin D supplement to lower your risk for  osteoporosis.  Menopause may have certain physical symptoms and risks.  Hormone replacement therapy may reduce some of these symptoms and risks. Talk to your health care provider about whether hormone replacement therapy is right for you. Follow these instructions at home:  Schedule regular health, dental, and eye exams.  Stay current with your immunizations.  Do not use any tobacco products including cigarettes, chewing tobacco, or electronic cigarettes.  If you are pregnant, do not drink alcohol.  If you are breastfeeding, limit how much and how often you drink alcohol.  Limit alcohol intake to no more than 1 drink per day for nonpregnant women. One drink equals 12 ounces of beer, 5 ounces of wine, or 1 ounces of hard liquor.  Do not use street drugs.  Do not share needles.  Ask your health care provider for help if you need support or information about quitting drugs.  Tell your health care provider if you often feel depressed.  Tell your health care provider if you have ever been abused or do not feel safe at home. This information is not intended to replace advice given to you by your health care provider. Make sure you discuss any questions you have with your health care provider. Document Released: 06/03/2011 Document Revised: 04/25/2016 Document Reviewed: 08/22/2015 Elsevier Interactive Patient Education  [employer].

## 2018-03-18 DIAGNOSIS — J301 Allergic rhinitis due to pollen: Secondary | ICD-10-CM | POA: Diagnosis not present

## 2018-03-23 ENCOUNTER — Other Ambulatory Visit: Payer: Self-pay | Admitting: Licensed Clinical Social Worker

## 2018-03-23 NOTE — Patient Outreach (Signed)
[facility]) Care Management  03/23/2018  Paula [name] [birth year] [id number]  Assessment-CSW completed initial outreach attempt today after receiving new referral for mental health assistance on 03/18/18. CSW unable to reach patient successfully. CSW left a HIPPA compliant voice message encouraging patient to return call once available.  Plan-CSW will await return call or complete an additional outreach if needed.  Paula [name], BSW, MSW, [name] Phone: [phone] Fax: [phone]

## 2018-03-24 DIAGNOSIS — M5442 Lumbago with sciatica, left side: Secondary | ICD-10-CM | POA: Diagnosis not present

## 2018-03-24 NOTE — Patient Outreach (Signed)
[place] [facility]) Care Management  03/24/2018  Paula [name] Jan 09, 1961 [id number]  Assessment- [facility] CSW will mail unsuccessful outreach letter to patient at this time.  Plan-[facility] CSW will complete second outreach attempt within one week.  Eula Fried, BSW, MSW, [email or web address] Phone: [phone] Fax: [phone]

## 2018-03-26 ENCOUNTER — Other Ambulatory Visit: Payer: Self-pay | Admitting: Licensed Clinical Social Worker

## 2018-03-26 NOTE — Patient Outreach (Signed)
[facility]) Care Management  03/26/2018  Paula [name] 05-15-1961 [id number]  Assessment- [facility] CSW completed second outreach attempt and was able to successfully reach patient. HIPPA verifications received. [facility] CSW introduced self, reason for call and of [facility] social work services. Patient confirms a long history of anxiety and depression. Patient denies any suicidal or homicidal ideations. Patient shares that she is unable to go to counseling because it will cost her $45.00 per visit and she is unable to afford this expense at this time but is in need of mental health support. Patient was on medications to treat her depression but reports that they were never effective for her. Patient reports "I seem to be doing a lot better since I stopped taking them." Patient reports being more active recently but has not been to the gym. [facility] CSW provided appropriate self-care education through session. Patient shares that her physical therapist will be helping her get back into the gym as that is a goal for her. [place] CSW educated patient on [place] in [place] which has different support groups and activities (meditation, women's wellness groups, creative writing, building self-esteem, art classes, road to recovery, that are absolutely free. Patient interested in receiving this information. [facility] CSW also provided information on mental health providers that provide free services or services based on a sliding scale fee such as [facility], [employer] of the [place], [place] and [place]. Patient appreciative of this information. [facility] CSW questioned if patient wishes to complete a home or office visit with [facility] CSW. Patient denied stating that she feels that these resources that were discussed today will suffice. Patient denies any further social work needs at this time. Patient is agreeable to [place] mailing out requested mental health resources at  this time. [facility] CSW will not open social work program as patient only request community resources. [facility] CSW will follow up within two-three weeks to make sure resources were successfully received.  Plan-[facility] CSW will send request to [place] to mail requested mental health resources. [facility] CSW will not open program at this time.  Eula Fried, BSW, MSW, [email or web address] Phone: [phone] Fax: [phone]

## 2018-03-26 NOTE — Patient Outreach (Signed)
[facility]) Care Management  03/26/2018  Paula [name] 09-27-1961 [id number]   Request received from [place], Eula Fried, to mail community resources to patient.  Resources mailed today.  Ronn Melena, BSW Social Worker [phone]

## 2018-03-31 DIAGNOSIS — M5442 Lumbago with sciatica, left side: Secondary | ICD-10-CM | POA: Diagnosis not present

## 2018-04-03 DIAGNOSIS — M5442 Lumbago with sciatica, left side: Secondary | ICD-10-CM | POA: Diagnosis not present

## 2018-04-06 DIAGNOSIS — L989 Disorder of the skin and subcutaneous tissue, unspecified: Secondary | ICD-10-CM | POA: Diagnosis not present

## 2018-04-06 DIAGNOSIS — Z9109 Other allergy status, other than to drugs and biological substances: Secondary | ICD-10-CM | POA: Diagnosis not present

## 2018-04-06 DIAGNOSIS — R05 Cough: Secondary | ICD-10-CM | POA: Diagnosis not present

## 2018-04-09 ENCOUNTER — Other Ambulatory Visit: Payer: Self-pay | Admitting: Licensed Clinical Social Worker

## 2018-04-09 NOTE — Patient Outreach (Signed)
[place]) Care Management  04/09/2018  Paula [name] [birth year] [id number]  Assessment-CSW completed outreach attempt today to follow up on mailed mental health resources that were sent on 03/26/18. CSW unable to reach patient successfully. CSW left a HIPPA compliant voice message encouraging patient to return call once available.  Plan-CSW will await return call or complete an additional outreach if needed within one to two weeks. [facility] CSW will send unsuccessful outreach letter at this time as well.  Eula Fried, BSW, MSW, [email or web address] Phone: [phone] Fax: [phone]

## 2018-04-15 ENCOUNTER — Other Ambulatory Visit: Payer: Self-pay | Admitting: Licensed Clinical Social Worker

## 2018-04-15 NOTE — Patient Outreach (Signed)
[facility]) Care Management  04/15/2018  Paula [name] [birth year] [id number]  Assessment- [facility] CSW completed second outreach attempt on 04/15/18 and was able to successfully reach her. HIPPA verifications received. [facility] CSW questioned if patient successfully received mental health resources in the mail that [facility] sent to her and patient declined. Patient reports not receiving anything in the mail from [place]. [facility] CSW completed entire review of available mental health resources again and patient was receptive to this education. Patient is still in need of gaining menta health resources. [facility] CSW questioned if patient would like for [facility] CSW to hand deliver resources and patient reports that mailing the resources will be fine. [facility] CSW will resend request to mail mental health resources to [place].  Plan-[facility] CSW will not open case but will follow up within two weeks to ensure that mental health resources were successfully received.  Eula Fried, BSW, MSW, [email or web address] Phone: [phone] Fax: [phone]

## 2018-04-15 NOTE — Patient Outreach (Signed)
[facility]) Care Management  04/15/2018  CAMBRIA OSTEN November 27, 1961 [id number]  Request received today from LCSW, Eula Fried, to mail mental health resources to patient.  Resources mailed today.    Ronn Melena, BSW Social Worker [phone]

## 2018-04-22 ENCOUNTER — Other Ambulatory Visit: Payer: Self-pay | Admitting: Family Medicine

## 2018-04-22 ENCOUNTER — Encounter (HOSPITAL_COMMUNITY): Payer: Self-pay

## 2018-04-22 ENCOUNTER — Emergency Department (HOSPITAL_COMMUNITY)
Admission: EM | Admit: 2018-04-22 | Discharge: 2018-04-23 | Disposition: A | Discharge status: Home / Self Care | Payer: PPO | Attending: Emergency Medicine | Admitting: Emergency Medicine

## 2018-04-22 ENCOUNTER — Ambulatory Visit
Admission: RE | Admit: 2018-04-22 | Discharge: 2018-04-22 | Disposition: A | Discharge status: Home / Self Care | Payer: PPO | Source: Ambulatory Visit | Attending: Family Medicine | Admitting: Family Medicine

## 2018-04-22 ENCOUNTER — Other Ambulatory Visit: Payer: Self-pay

## 2018-04-22 ENCOUNTER — Emergency Department (HOSPITAL_COMMUNITY): Payer: PPO

## 2018-04-22 DIAGNOSIS — R05 Cough: Secondary | ICD-10-CM

## 2018-04-22 DIAGNOSIS — I889 Nonspecific lymphadenitis, unspecified: Secondary | ICD-10-CM | POA: Diagnosis not present

## 2018-04-22 DIAGNOSIS — I10 Essential (primary) hypertension: Secondary | ICD-10-CM | POA: Diagnosis not present

## 2018-04-22 DIAGNOSIS — R059 Cough, unspecified: Secondary | ICD-10-CM

## 2018-04-22 DIAGNOSIS — R0602 Shortness of breath: Secondary | ICD-10-CM

## 2018-04-22 DIAGNOSIS — Z79899 Other long term (current) drug therapy: Secondary | ICD-10-CM | POA: Diagnosis not present

## 2018-04-22 DIAGNOSIS — R0600 Dyspnea, unspecified: Secondary | ICD-10-CM | POA: Diagnosis not present

## 2018-04-22 DIAGNOSIS — Z87891 Personal history of nicotine dependence: Secondary | ICD-10-CM | POA: Insufficient documentation

## 2018-04-22 DIAGNOSIS — J069 Acute upper respiratory infection, unspecified: Secondary | ICD-10-CM | POA: Diagnosis not present

## 2018-04-22 LAB — I-STAT TROPONIN, ED: TROPONIN I, POC: 0.01 ng/mL (ref 0.00–0.08)

## 2018-04-22 LAB — URINALYSIS, ROUTINE W REFLEX MICROSCOPIC
BACTERIA UA: NONE SEEN
BILIRUBIN URINE: NEGATIVE
Glucose, UA: NEGATIVE mg/dL
Ketones, ur: NEGATIVE mg/dL
Leukocytes, UA: NEGATIVE
NITRITE: NEGATIVE
Protein, ur: NEGATIVE mg/dL
SPECIFIC GRAVITY, URINE: 1.002 — AB (ref 1.005–1.030)
pH: 7 (ref 5.0–8.0)

## 2018-04-22 LAB — COMPREHENSIVE METABOLIC PANEL
ALBUMIN: 3.9 g/dL (ref 3.5–5.0)
ALK PHOS: 73 U/L (ref 38–126)
ALT: 54 U/L (ref 14–54)
ANION GAP: 15 (ref 5–15)
AST: 43 U/L — ABNORMAL HIGH (ref 15–41)
BILIRUBIN TOTAL: 0.6 mg/dL (ref 0.3–1.2)
BUN: 10 mg/dL (ref 6–20)
CALCIUM: 9.9 mg/dL (ref 8.9–10.3)
CO2: 20 mmol/L — AB (ref 22–32)
CREATININE: 0.93 mg/dL (ref 0.44–1.00)
Chloride: 106 mmol/L (ref 101–111)
GLUCOSE: 191 mg/dL — AB (ref 65–99)
POTASSIUM: 3.6 mmol/L (ref 3.5–5.1)
Sodium: 141 mmol/L (ref 135–145)
Total Protein: 7.2 g/dL (ref 6.5–8.1)

## 2018-04-22 LAB — CBC
HCT: 37.3 % (ref 36.0–46.0)
Hemoglobin: 12 g/dL (ref 12.0–15.0)
MCH: 28.8 pg (ref 26.0–34.0)
MCHC: 32.2 g/dL (ref 30.0–36.0)
MCV: 89.7 fL (ref 78.0–100.0)
PLATELETS: 324 10*3/uL (ref 150–400)
RBC: 4.16 MIL/uL (ref 3.87–5.11)
RDW: 11.9 % (ref 11.5–15.5)
WBC: 14.1 10*3/uL — ABNORMAL HIGH (ref 4.0–10.5)

## 2018-04-22 LAB — D-DIMER, QUANTITATIVE: D-Dimer, Quant: 0.64 ug/mL-FEU — ABNORMAL HIGH (ref 0.00–0.50)

## 2018-04-22 MED ORDER — DIPHENHYDRAMINE HCL 50 MG/ML IJ SOLN
50.0000 mg | Freq: Once | INTRAMUSCULAR | Status: AC
  Administered 2018-04-23: 50 mg via INTRAVENOUS
  Filled 2018-04-22: qty 1

## 2018-04-22 MED ORDER — HYDROCORTISONE NA SUCCINATE PF 250 MG IJ SOLR
200.0000 mg | Freq: Once | INTRAMUSCULAR | Status: AC
  Administered 2018-04-22: 125 mg via INTRAVENOUS
  Filled 2018-04-22: qty 200

## 2018-04-22 MED ORDER — IPRATROPIUM-ALBUTEROL 0.5-2.5 (3) MG/3ML IN SOLN
3.0000 mL | Freq: Once | RESPIRATORY_TRACT | Status: AC
  Administered 2018-04-22: 3 mL via RESPIRATORY_TRACT
  Filled 2018-04-22: qty 3

## 2018-04-22 NOTE — ED Provider Notes (Signed)
Patient placed in Quick Look pathway, seen and evaluated   Chief Complaint: shortness of breath  HPI:   Paula [name] is a 57 y.o. female, presenting to the ED with shortness of breath for at least 2 to 3 weeks.  Also endorses productive cough.  Pain with deep breathing.  She was evaluated by her PCP today and was sent to the ED with a request for a d-dimer.  Complains of pain to the right lateral chest, right upper quadrant, right flank, and right side of the back.  Denies peripheral edema, fever, N/V/D.  ROS: Shortness of breath (one)  Physical Exam:   Gen: No distress  Neuro: Awake and Alert  Skin: Warm    Focused Exam:   No diaphoresis.  No pallor.  Pulmonary: Tachypnea with conversational dyspnea.  Lung sounds clear.    Cardiac: Normal rate and regular. Peripheral pulses intact.  Abdominal: Tenderness to the right upper quadrant and right flank  MSK: No peripheral edema.  Tenderness to the right lateral and posterior ribs.   Initiation of care has begun. The patient has been counseled on the process, plan, and necessity for staying for the completion/evaluation, and the remainder of the medical screening examination   Layla Maw 04/22/18 [place], Kevin, MD 04/22/18 [phone]

## 2018-04-22 NOTE — ED Provider Notes (Signed)
[facility] EMERGENCY DEPARTMENT Provider Note   CSN: [id number] Arrival date & time: 04/22/18  1709     History   Chief Complaint Chief Complaint  Patient presents with  . Shortness of Breath    HPI Paula [name] is a 57 y.o. female tendon to the emergency department for evaluation of shortness of breath.  Patient states that her symptoms began about 5 weeks ago with sore, itchy throat.  She has had persistent cough, worsening exertional dyspnea and shortness of breath with chest tightness, she denies PND or orthopnea.  She denies peripheral edema.  She states that even getting up from a sitting position makes her feel winded.  She did complete course of steroids for about 5 days, also a Z-Pak about 2 weeks ago.  Prior to that she was started on allergy medications which did not help.  She states that her shortness of breath has worsened.  She saw her PCP today who was concerned that she might have a pulmonary embolus given her severe shortness of breath and sent her to the emergency department for further evaluation.  HPI  Past Medical History:  Diagnosis Date  . Anxiety   . Chronic bronchitis ([place])   . Chronic lower back pain   . Depression   . Diverticulitis   . Diverticulosis of colon   . Expressive aphasia 11/15/2016  . Fibromyalgia   . GERD (gastroesophageal reflux disease)   . High cholesterol   . History of gastric ulcer   . History of hiatal hernia   . History of kidney stones   . History of thyroid nodule   . Hyperlipidemia   . IBS (irritable bowel syndrome)   . Intermittent vertigo   . Migraine    "a few times/year now; maybe" (11/15/2016)  . OSA on CPAP    wears CPAP nightly ;study in epic 02-01-2014 (11/15/2016)  . Osteoarthritis    "knees, hands, back, hips" (11/15/2016)  . Pneumonia X 1   hx of  . Psychogenic tremor    intermittant head tremor-(11/15/2016)  . Refusal of blood transfusions as patient is Jehovah's Witness   . RLS  (restless legs syndrome)   . Urinary incontinence     Patient Active Problem List   Diagnosis Date Noted  . Depression, major 03/17/2018  . Chronic pain syndrome 03/17/2018  . Expressive aphasia 11/15/2016  . Cough 11/08/2015  . GERD (gastroesophageal reflux disease) 11/08/2015  . B12 deficiency 12/07/2014  . Wart viral 11/07/2014  . Coarse tremors 02/09/2014  . OSA on CPAP 01/28/2014  . Psychosomatic disorder 01/14/2014  . Dyspnea 01/12/2014  . Tremor of face and hands 01/12/2014  . Breast pain in female 02/09/2013  . Elevated WBC count 10/12/2012  . Abdominal pain, other specified site 10/09/2012  . Abdominal bloating 10/09/2012  . Ovarian cystic mass 05/25/2012  . Shoulder pain, right 03/11/2012  . Right arm pain 12/30/2011  . ACNE, ROSACEA 07/17/2010  . LEG PAIN 07/17/2010  . NECK PAIN 05/10/2010  . ABDOMINAL PAIN, EPIGASTRIC 05/10/2010  . BURSITIS, LEFT HIP 05/01/2010  . PLANTAR FASCIITIS, BILATERAL 05/01/2010  . HYPERGLYCEMIA 01/05/2010  . PRURITUS 11/27/2009  . TOBACCO USE, QUIT 11/27/2009  . ANXIETY 11/22/2008  . FATIGUE 11/22/2008  . Essential hypertension 10/07/2008  . Insomnia, unspecified 08/30/2008  . Obesity 08/19/2008  . NAUSEA ALONE 05/02/2008  . VERTIGO 01/19/2008  . DIARRHEA 01/19/2008  . THYROID NODULE 06/23/2007  . HYPERLIPIDEMIA 06/23/2007  . Situational depression 06/23/2007  . ADD  06/23/2007  . TMJ SYNDROME 06/23/2007  . GERD 06/23/2007  . Irritable bowel syndrome 06/23/2007  . Headache 06/23/2007    Past Surgical History:  Procedure Laterality Date  . ABDOMINAL HYSTERECTOMY  1990  . ANTERIOR CERVICAL DECOMP/DISCECTOMY FUSION  02-10-2004   C4 -- C7  . BACK SURGERY    . CARDIOVASCULAR STRESS TEST  2017   Negative  . CESAREAN SECTION  1977; 1982  . COLONOSCOPY    . CYSTOSCOPY W/ RETROGRADES Right 06/07/2015   Procedure: CYSTOSCOPY WITH RETROGRADE PYELOGRAM;  Surgeon: Ardis Hughs, MD;  Location[facility];   Service: Urology;  Laterality: Right;  . CYSTOSCOPY WITH URETEROSCOPY AND STENT PLACEMENT Right 06/07/2015   Procedure: RIGHT URETEROSCOPY, LASER LITHOTRIPSY, STONE REMOVAL AND RIGHT URETERAL STENT PLACEMENT;  Surgeon: Ardis Hughs, MD;  Location: [facility];  Service: Urology;  Laterality: Right;  . HERNIA REPAIR    . HOLMIUM LASER APPLICATION N/A 01/05/2682   Procedure: HOLMIUM LASER APPLICATION;  Surgeon: Ardis Hughs, MD;  Location: [facility];  Service: Urology;  Laterality: N/A;  . LAPAROSCOPIC CHOLECYSTECTOMY  10-07-2000  . LIPOMA EXCISION  01/2017   from neck  . UMBILICAL HERNIA REPAIR  age 18  . UPPER GI ENDOSCOPY       OB History    Gravida  3   Para  2   Term  2   Preterm      AB  1   Living  2     SAB      TAB  1   Ectopic      Multiple      Live Births               Home Medications    Prior to Admission medications   Medication Sig Start Date End Date Taking? Authorizing Provider  dicyclomine (BENTYL) 10 MG capsule Take 10 mg by mouth 4 (four) times daily -  before meals and at bedtime.    [provider]  meclizine (ANTIVERT) 25 MG tablet Take 25 mg by mouth.    [provider]  pantoprazole (PROTONIX) 40 MG tablet Take 40 mg by mouth 2 (two) times daily before a meal.    [provider]  tolterodine (DETROL) 1 MG tablet Take 1 mg by mouth 2 (two) times daily.    [provider]  traMADol (ULTRAM) 50 MG tablet Take 50 mg by mouth every 6 (six) hours as needed for moderate pain.    [provider]    Family History Family History  Problem Relation Age of Onset  . Colon cancer Maternal Grandfather   . Hypertension Maternal Grandfather   . Cancer Maternal Grandfather        lung  . Hyperlipidemia Mother   . Hypertension Mother   . Cancer Maternal Aunt        lung  . Hypertension Sister   . Hypertension Maternal Grandmother   . Hypertension Brother      Social History Social History   Tobacco Use  . Smoking status: Former Smoker    Packs/day: 0.50    Years: 25.00    Pack years: 12.50    Types: Cigarettes    Last attempt to quit: 07/24/2008    Years since quitting: 9.7  . Smokeless tobacco: Never Used  Substance Use Topics  . Alcohol use: Yes    Alcohol/week: 0.0 oz    Comment: 11/15/2016 "might have 1 drink/month; if  that"  . Drug use: No     Allergies   Detrol [tolterodine]; Gabapentin; Aspirin; Contrast media [iodinated diagnostic agents]; Hydrocodone-acetaminophen; Imitrex [sumatriptan]; Oxycodone; and Avelox [moxifloxacin]   Review of Systems Review of Systems  Ten systems reviewed and are negative for acute change, except as noted in the HPI.   Physical Exam Updated Vital Signs BP 122/83   Pulse 75   Temp 98.1 F (36.7 C) (Oral)   Resp 19   Ht 5\' 4"  (1.626 m)   Wt 113.4 kg (250 lb)   SpO2 97%   BMI 42.91 kg/m   Physical Exam  Constitutional: She is oriented to person, place, and time. She appears well-developed and well-nourished. No distress.  HENT:  Head: Normocephalic and atraumatic.  Mouth/Throat: Oropharynx is clear and moist.  Eyes: Conjunctivae are normal. No scleral icterus.  Neck: Normal range of motion.  Cardiovascular: Normal rate, regular rhythm and normal heart sounds. Exam reveals no gallop and no friction rub.  No murmur heard. Pulmonary/Chest: Breath sounds normal. Tachypnea noted. No respiratory distress. She has no wheezes.  Decreased air movement, tachypnea, shallow breathing  Abdominal: Soft. Bowel sounds are normal. She exhibits no distension and no mass. There is no tenderness. There is no guarding.  Musculoskeletal:       Right lower leg: She exhibits no edema.       Left lower leg: She exhibits no edema.  Neurological: She is alert and oriented to person, place, and time.  Skin: Skin is warm and dry. She is not diaphoretic.  Psychiatric: Her behavior is normal.  Nursing  note and vitals reviewed.    ED Treatments / Results  Labs (all labs ordered are listed, but only abnormal results are displayed) Labs Reviewed  CBC - Abnormal; Notable for the following components:      Result Value   WBC 14.1 (*)    All other components within normal limits  COMPREHENSIVE METABOLIC PANEL - Abnormal; Notable for the following components:   CO2 20 (*)    Glucose, Bld 191 (*)    AST 43 (*)    All other components within normal limits  D-DIMER, QUANTITATIVE (NOT AT [facility]) - Abnormal; Notable for the following components:   D-Dimer, Quant 0.64 (*)    All other components within normal limits  URINALYSIS, ROUTINE W REFLEX MICROSCOPIC - Abnormal; Notable for the following components:   Color, Urine COLORLESS (*)    Specific Gravity, Urine 1.002 (*)    Hgb urine dipstick SMALL (*)    All other components within normal limits  I-STAT TROPONIN, ED    EKG EKG Interpretation  Date/Time:  Wednesday Apr 22 2018 20:54:07 EDT Ventricular Rate:  78 PR Interval:    QRS Duration: 84 QT Interval:  373 QTC Calculation: 425 R Axis:   13 Text Interpretation:  Sinus rhythm Nonspecific T abnormalities, anterior leads since last tracing no significant change Confirmed by Daleen Bo [phone]) on 04/22/2018 10:51:28 PM   Radiology Dg Chest 2 View  Result Date: 04/22/2018 CLINICAL DATA:  Shortness of breath EXAM: CHEST - 2 VIEW COMPARISON:  04/22/2018 FINDINGS: Left base atelectasis. Right lung clear. Heart is normal size. No effusions. No acute bony abnormality. IMPRESSION: Left base atelectasis. Electronically Signed   By: Rolm Baptise M.D.   On: 04/22/2018 18:03    Procedures Procedures (including critical care time)  Medications Ordered in ED Medications  diphenhydrAMINE (BENADRYL) injection 50 mg (has no administration in time range)  hydrocortisone sodium succinate (SOLU-CORTEF)  injection 200 mg (125 mg Intravenous Given 04/22/18 2219)  ipratropium-albuterol (DUONEB)  0.5-2.5 (3) MG/3ML nebulizer solution 3 mL (3 mLs Nebulization Given 04/22/18 2219)     Initial Impression / Assessment and Plan / ED Course  I have reviewed the triage vital signs and the nursing notes.  Pertinent labs & imaging results that were available during my care of the patient were reviewed by me and considered in my medical decision making (see chart for details).     Patient with shortness of breath sent in by her primary care physician.  She will require CTA angio she has an elevated d-dimer.  Patient does have a contrast allergy that includes hives and was given Solu-Cortef at about 1015.  She will get her Benadryl at 1:15 AM and then can have her study about an hour later.  I discussed the case with Dr. Roxanne Mins who will assume care of the patient and disposition the patient appropriately.  Final Clinical Impressions(s) / ED Diagnoses   Final diagnoses:  None    ED Discharge Orders    None       Margarita Mail, PA-C 91/47/82 [id number]    Delora Fuel, MD 13/08/65 [phone]

## 2018-04-22 NOTE — ED Triage Notes (Signed)
Pt states that she has been having breathing trouble X1 month was seen at PCP today and had lab work done. Pt states that PCP wanted her to have a D-Dimer. Pt SOB in triage when speaking.

## 2018-04-22 NOTE — ED Notes (Signed)
IV nurses at bedside attempting IV insertion .

## 2018-04-23 ENCOUNTER — Encounter (HOSPITAL_COMMUNITY): Payer: Self-pay | Admitting: Radiology

## 2018-04-23 ENCOUNTER — Emergency Department (HOSPITAL_COMMUNITY): Payer: PPO

## 2018-04-23 DIAGNOSIS — R0600 Dyspnea, unspecified: Secondary | ICD-10-CM | POA: Diagnosis not present

## 2018-04-23 DIAGNOSIS — R0602 Shortness of breath: Secondary | ICD-10-CM | POA: Diagnosis not present

## 2018-04-23 LAB — I-STAT TROPONIN, ED: Troponin i, poc: 0.01 ng/mL (ref 0.00–0.08)

## 2018-04-23 MED ORDER — IOPAMIDOL (ISOVUE-370) INJECTION 76%
INTRAVENOUS | Status: AC
  Filled 2018-04-23: qty 100

## 2018-04-23 MED ORDER — IOPAMIDOL (ISOVUE-370) INJECTION 76%
100.0000 mL | Freq: Once | INTRAVENOUS | Status: AC | PRN
  Administered 2018-04-23: 100 mL via INTRAVENOUS

## 2018-04-23 NOTE — ED Notes (Signed)
Pt departed in NAD.  

## 2018-04-23 NOTE — Discharge Instructions (Addendum)
Your evaluation in the emergency department did not show a clear reason why you are so short of breath.  Please follow-up with your pulmonologist.  Return to the emergency department if symptoms are getting worse.

## 2018-04-23 NOTE — ED Notes (Signed)
Placed pt on 2L O2 via [place] solely for comfort. SpO2 on RA 95%.

## 2018-04-23 NOTE — ED Notes (Signed)
Patient transported to CT 

## 2018-04-24 ENCOUNTER — Encounter: Payer: Self-pay | Admitting: Adult Health

## 2018-04-24 ENCOUNTER — Ambulatory Visit: Payer: PPO | Admitting: Adult Health

## 2018-04-24 ENCOUNTER — Ambulatory Visit: Payer: Self-pay | Admitting: Adult Health

## 2018-04-24 VITALS — BP 144/88 | HR 95 | Temp 98.0°F | Ht 65.0 in | Wt 254.0 lb

## 2018-04-24 DIAGNOSIS — R05 Cough: Secondary | ICD-10-CM | POA: Diagnosis not present

## 2018-04-24 DIAGNOSIS — K219 Gastro-esophageal reflux disease without esophagitis: Secondary | ICD-10-CM | POA: Diagnosis not present

## 2018-04-24 DIAGNOSIS — R059 Cough, unspecified: Secondary | ICD-10-CM

## 2018-04-24 LAB — NITRIC OXIDE: Nitric Oxide: 9

## 2018-04-24 MED ORDER — BUDESONIDE-FORMOTEROL FUMARATE 80-4.5 MCG/ACT IN AERO: 2.0000 | INHALATION_SPRAY | Freq: Two times a day (BID) | RESPIRATORY_TRACT | 5 refills | Status: DC

## 2018-04-24 MED ORDER — BENZONATATE 200 MG PO CAPS: 200.0000 mg | ORAL_CAPSULE | Freq: Three times a day (TID) | ORAL | 1 refills | Status: DC | PRN

## 2018-04-24 MED ORDER — BUDESONIDE-FORMOTEROL FUMARATE 80-4.5 MCG/ACT IN AERO: 2.0000 | INHALATION_SPRAY | Freq: Two times a day (BID) | RESPIRATORY_TRACT | 0 refills | Status: DC

## 2018-04-24 NOTE — Assessment & Plan Note (Signed)
increaesd GERD tx   Plan  Patient Instructions  Finish antibitoics and prednisone .  Begin Symbicort 80\4.72mcg 2 puffs Twice daily  . Rinse after use.  Begin Claritin 10mg  daily .  Saline nasal rinses Twice daily  .  Begin Flonase 2 puffs daily  Begin Protonix 40mg   daily in am before meal  Begin Pepcid 20mg  At bedtime   Delsym 2 tsp Twice daily  For cough , As needed   Tessalon Three times a day As needed  Cough .  Follow up with Dr. Elsworth Soho  In 6-8 weeks and As needed   Please contact office for sooner follow up if symptoms do not improve or worsen or seek emergency care

## 2018-04-24 NOTE — Patient Instructions (Addendum)
Finish antibitoics and prednisone .  Begin Symbicort 80\4.36mcg 2 puffs Twice daily  . Rinse after use.  Begin Claritin 10mg  daily .  Saline nasal rinses Twice daily  .  Begin Flonase 2 puffs daily  Begin Protonix 40mg   daily in am before meal  Begin Pepcid 20mg  At bedtime   Delsym 2 tsp Twice daily  For cough , As needed   Tessalon Three times a day As needed  Cough .  Follow up with Dr. Elsworth Soho  In 6-8 weeks and As needed   Please contact office for sooner follow up if symptoms do not improve or worsen or seek emergency care

## 2018-04-24 NOTE — Assessment & Plan Note (Signed)
Chronic cough ? RAD  Control for triggers and cough control   Plan  Patient Instructions  Finish antibitoics and prednisone .  Begin Symbicort 80\4.35mcg 2 puffs Twice daily  . Rinse after use.  Begin Claritin 10mg  daily .  Saline nasal rinses Twice daily  .  Begin Flonase 2 puffs daily  Begin Protonix 40mg   daily in am before meal  Begin Pepcid 20mg  At bedtime   Delsym 2 tsp Twice daily  For cough , As needed   Tessalon Three times a day As needed  Cough .  Follow up with Dr. Elsworth Soho  In 6-8 weeks and As needed   Please contact office for sooner follow up if symptoms do not improve or worsen or seek emergency care

## 2018-04-24 NOTE — Addendum Note (Signed)
Addended by: Parke Poisson E on: 04/24/2018 03:20 PM   Modules accepted: Orders

## 2018-04-24 NOTE — Progress Notes (Signed)
@Patient  ID: Paula [name], female    DOB: [birth year], 57 y.o.   MRN: [id number]  Chief Complaint  Patient presents with  . Acute Visit    Cough     Referring provider: Carol Ada, MD  HPI: 57 year old female followed for sleep apnea and chronic cough   TEST  PSG 2002 and was placed on CPAP 9cm with a nasal mask PSG 01/2014 - AHI 8/h, RDI 2/h , lowest 79% Placed on autoCPAP with pillows ([employer]) >>avg pr 11 cm  PFTs 11/2015 FEV1 at 99%, ratio 92, no significant bronchodilator response, FVC 85%. DLCO 82%. Stress myoview 2017 EF 65%, low risk study  Echo 2017 EF nml   04/24/2018 Acute OV : Cough  Patient presents for an acute office visit. Complains over last 2 months she has had ongoing cough , hoarseness, sinus drainage , nasal congestion , shortness of breath .  Has seen PCP several times , given abx , flonase , zyrtec , proair . No improvement in symptoms . Sent to ER on 04/23/18 , CT chest Apr 23, 2018 showed clear lungs negative for PE. She is not feeling any better. Recently started on abx and steroid taper at ER .  FENO 9 ppb .  Spirometry shows moderate restriction with FEV1 at 71%, ratio 89, FVC 63%.  Has OSA . On CPAP At bedtime  . Never misses a day .   Has GERD , takes Protonix At bedtime  .   Walk test in office on room air with O2 sats 100% .    Allergies  Allergen Reactions  . Detrol [Tolterodine] Other (See Comments)    slurred speech, tremors, dizziness  . Gabapentin Palpitations and Other (See Comments)    Wt gain, tremor  . Aspirin Other (See Comments)    Avoids--- history of gastric ulcers   . Contrast Media [Iodinated Diagnostic Agents] Hives, Itching and Rash    CT contrast-Needed to take benadryl   . Hydrocodone-Acetaminophen Nausea And Vomiting  . Imitrex [Sumatriptan] Other (See Comments)    Body hurts  . Oxycodone Nausea And Vomiting  . Avelox [Moxifloxacin] Itching    Immunization History  Administered Date(s) Administered  .  Influenza,inj,Quad PF,6+ Mos 09/20/2014  . Tdap 12/30/2011    Past Medical History:  Diagnosis Date  . Anxiety   . Chronic bronchitis ([place])   . Chronic lower back pain   . Depression   . Diverticulitis   . Diverticulosis of colon   . Expressive aphasia 11/15/2016  . Fibromyalgia   . GERD (gastroesophageal reflux disease)   . High cholesterol   . History of gastric ulcer   . History of hiatal hernia   . History of kidney stones   . History of thyroid nodule   . Hyperlipidemia   . IBS (irritable bowel syndrome)   . Intermittent vertigo   . Migraine    "a few times/year now; maybe" (11/15/2016)  . OSA on CPAP    wears CPAP nightly ;study in epic 02-01-2014 (11/15/2016)  . Osteoarthritis    "knees, hands, back, hips" (11/15/2016)  . Pneumonia X 1   hx of  . Psychogenic tremor    intermittant head tremor-(11/15/2016)  . Refusal of blood transfusions as patient is Jehovah's Witness   . RLS (restless legs syndrome)   . Urinary incontinence     Tobacco History: Social History   Tobacco Use  Smoking Status Former Smoker  . Packs/day: 0.50  . Years: 25.00  . Pack  years: 12.50  . Types: Cigarettes  . Last attempt to quit: 07/24/2008  . Years since quitting: 9.7  Smokeless Tobacco Never Used   Counseling given: Not Answered   Outpatient Encounter Medications as of 04/24/2018  Medication Sig  . albuterol (PROVENTIL HFA;VENTOLIN HFA) 108 (90 Base) MCG/ACT inhaler Inhale 1-2 puffs into the lungs every 6 (six) hours as needed for wheezing or shortness of breath.  . cyclobenzaprine (FLEXERIL) 10 MG tablet Take 10 mg by mouth at bedtime as needed for muscle spasms.  . meclizine (ANTIVERT) 25 MG tablet Take 25 mg by mouth 2 (two) times daily as needed for dizziness.   . pantoprazole (PROTONIX) 40 MG tablet Take 40 mg by mouth 2 (two) times daily before a meal.  . benzonatate (TESSALON) 200 MG capsule Take 1 capsule (200 mg total) by mouth 3 (three) times daily as needed for  cough.  . budesonide-formoterol (SYMBICORT) 80-4.5 MCG/ACT inhaler Inhale 2 puffs into the lungs 2 (two) times daily.  . traMADol (ULTRAM) 50 MG tablet Take 50 mg by mouth every 6 (six) hours as needed for moderate pain.   No facility-administered encounter medications on file as of 04/24/2018.      Review of Systems  Constitutional:   No  weight loss, night sweats,  Fevers, chills, fatigue, or  lassitude.  HEENT:   No headaches,  Difficulty swallowing,  Tooth/dental problems, or  Sore throat,                No sneezing, itching, ear ache,  +nasal congestion, post nasal drip,   CV:  No chest pain,  Orthopnea, PND, swelling in lower extremities, anasarca, dizziness, palpitations, syncope.   GI  No heartburn, indigestion, abdominal pain, nausea, vomiting, diarrhea, change in bowel habits, loss of appetite, bloody stools.   Resp:    No chest wall deformity  Skin: no rash or lesions.  GU: no dysuria, change in color of urine, no urgency or frequency.  No flank pain, no hematuria   MS:  No joint pain or swelling.  No decreased range of motion.  No back pain.    Physical Exam  BP (!) 144/88 (BP Location: Right Wrist, Cuff Size: Normal)   Pulse 95   Temp 98 F (36.7 C) (Oral)   Ht 5\' 5"  (1.651 m)   Wt 254 lb (115.2 kg)   SpO2 97%   BMI 42.27 kg/m   GEN: A/Ox3; pleasant , NAD, obese    HEENT:  [place]/AT,  EACs-clear, TMs-wnl, NOSE-clear drainage , THROAT-clear, no lesions, no postnasal drip or exudate noted.   NECK:  Supple w/ fair ROM; no JVD; normal carotid impulses w/o bruits; no thyromegaly or nodules palpated; no lymphadenopathy.    RESP  Clear  P & A; w/o, wheezes/ rales/ or rhonchi. no accessory muscle use, no dullness to percussion  CARD:  RRR, no m/r/g, no peripheral edema, pulses intact, no cyanosis or clubbing.  GI:   Soft & nt; nml bowel sounds; no organomegaly or masses detected.   Musco: Warm bil, no deformities or joint swelling noted.   Neuro: alert, no  focal deficits noted.    Skin: Warm, no lesions or rashes    Lab Results:  CBC    Component Value Date/Time   WBC 14.1 (H) 04/22/2018 1747   RBC 4.16 04/22/2018 1747   HGB 12.0 04/22/2018 1747   HCT 37.3 04/22/2018 1747   PLT 324 04/22/2018 1747   MCV 89.7 04/22/2018 1747   MCH 28.8  04/22/2018 1747   MCHC 32.2 04/22/2018 1747   RDW 11.9 04/22/2018 1747   LYMPHSABS 4.7 (H) 11/15/2016 1255   MONOABS 0.6 11/15/2016 1255   EOSABS 0.3 11/15/2016 1255   BASOSABS 0.0 11/15/2016 1255    BMET    Component Value Date/Time   NA 141 04/22/2018 1747   K 3.6 04/22/2018 1747   CL 106 04/22/2018 1747   CO2 20 (L) 04/22/2018 1747   GLUCOSE 191 (H) 04/22/2018 1747   BUN 10 04/22/2018 1747   CREATININE 0.93 04/22/2018 1747   CALCIUM 9.9 04/22/2018 1747   GFRNONAA >60 04/22/2018 1747   GFRAA >60 04/22/2018 1747    BNP    Component Value Date/Time   BNP 7.6 09/27/2014 1541    ProBNP    Component Value Date/Time   PROBNP 43.6 07/02/2014 2212    Imaging: Dg Chest 2 View  Result Date: 04/23/2018 CLINICAL DATA:  Constant dyspnea and cough for over a month, pain with coughing, former smoker EXAM: CHEST - 2 VIEW COMPARISON:  05/11/2016 FINDINGS: Borderline enlargement of cardiac silhouette. Mediastinal contours and pulmonary vascularity normal. Lungs clear. No pleural effusion or pneumothorax. Prior cervical spine fusion. IMPRESSION: No acute abnormalities. Electronically Signed   By: Lavonia Dana M.D.   On: 04/23/2018 08:01   Dg Chest 2 View  Result Date: 04/22/2018 CLINICAL DATA:  Shortness of breath EXAM: CHEST - 2 VIEW COMPARISON:  04/22/2018 FINDINGS: Left base atelectasis. Right lung clear. Heart is normal size. No effusions. No acute bony abnormality. IMPRESSION: Left base atelectasis. Electronically Signed   By: Rolm Baptise M.D.   On: 04/22/2018 18:03   Ct Angio Chest Pe W And/or Wo Contrast  Result Date: 04/23/2018 CLINICAL DATA:  Short of breath, cough and dyspnea EXAM:  CT ANGIOGRAPHY CHEST WITH CONTRAST TECHNIQUE: Multidetector CT imaging of the chest was performed using the standard protocol during bolus administration of intravenous contrast. Multiplanar CT image reconstructions and MIPs were obtained to evaluate the vascular anatomy. CONTRAST:  140mL ISOVUE-370 IOPAMIDOL (ISOVUE-370) INJECTION 76% COMPARISON:  Chest x-ray 04/22/2018, CT abdomen pelvis 04/02/2018 FINDINGS: Cardiovascular: Satisfactory opacification of the pulmonary arteries to the segmental level. No evidence of pulmonary embolism. Normal heart size. No pericardial effusion. Nonaneurysmal aorta. No dissection. Ectatic ascending aorta, measuring up to 3.6 cm. Mediastinum/Nodes: Midline trachea. No thyroid mass. Few prominent mediastinal lymph nodes. Right paratracheal lymph node measures 11 mm. Left paratracheal lymph node measures 1 cm. Esophagus within normal limits. Lungs/Pleura: Lungs are clear. No pleural effusion or pneumothorax. Upper Abdomen: Hepatic steatosis.  Clips in the gallbladder fossa Musculoskeletal: No chest wall abnormality. No acute or significant osseous findings. Review of the MIP images confirms the above findings. IMPRESSION: 1. No CT evidence for acute pulmonary embolus or aortic dissection 2. Clear lung fields 3. Nonspecific mildly prominent mediastinal lymph nodes Electronically Signed   By: Donavan Foil M.D.   On: 04/23/2018 03:18     Assessment & Plan:   Cough Chronic cough ? RAD  Control for triggers and cough control   Plan  Patient Instructions  Finish antibitoics and prednisone .  Begin Symbicort 80\4.39mcg 2 puffs Twice daily  . Rinse after use.  Begin Claritin 10mg  daily .  Saline nasal rinses Twice daily  .  Begin Flonase 2 puffs daily  Begin Protonix 40mg   daily in am before meal  Begin Pepcid 20mg  At bedtime   Delsym 2 tsp Twice daily  For cough , As needed   Tessalon Three times a day  As needed  Cough .  Follow up with Dr. Elsworth Soho  In 6-8 weeks and As  needed   Please contact office for sooner follow up if symptoms do not improve or worsen or seek emergency care          GERD increaesd GERD tx   Plan  Patient Instructions  Finish antibitoics and prednisone .  Begin Symbicort 80\4.15mcg 2 puffs Twice daily  . Rinse after use.  Begin Claritin 10mg  daily .  Saline nasal rinses Twice daily  .  Begin Flonase 2 puffs daily  Begin Protonix 40mg   daily in am before meal  Begin Pepcid 20mg  At bedtime   Delsym 2 tsp Twice daily  For cough , As needed   Tessalon Three times a day As needed  Cough .  Follow up with Dr. Elsworth Soho  In 6-8 weeks and As needed   Please contact office for sooner follow up if symptoms do not improve or worsen or seek emergency care            Rexene Edison, NP 04/24/2018

## 2018-04-24 NOTE — Progress Notes (Signed)
Patient seen in the office today and instructed on use of Symbicort 80/4.82mcg.  Patient expressed understanding and demonstrated technique. Parke Poisson, [facility] 04/24/18

## 2018-04-28 ENCOUNTER — Other Ambulatory Visit: Payer: Self-pay | Admitting: Licensed Clinical Social Worker

## 2018-04-28 NOTE — Patient Outreach (Signed)
[facility]) Care Management  04/28/2018  LETESHA KLECKER Dec 07, 1960 [id number]  [facility] CSW completed outreach call to patient and was able to successfully reach her. Patient reports that she successfully received mental health resources in the mail from [place]. [facility] CSW completed entire review of each resource provided. Patient shares that she is unsure if she will pursue these resources now or later but has spent a lot of time reviewing the resources over the weekend. Patient denies any further social work needs and is aware that her case will be closed at this time but that she can contact [place] in the future if any needs were to arise. Patient appreciative of social work assistance provided. [facility] CSW will sign off at this time.  Eula Fried, BSW, MSW, [email or web address] Phone: [phone] Fax: [phone]

## 2018-04-29 ENCOUNTER — Other Ambulatory Visit: Payer: Self-pay

## 2018-04-29 DIAGNOSIS — Z6841 Body Mass Index (BMI) 40.0 and over, adult: Secondary | ICD-10-CM | POA: Diagnosis not present

## 2018-04-29 DIAGNOSIS — E2839 Other primary ovarian failure: Secondary | ICD-10-CM | POA: Diagnosis not present

## 2018-04-29 DIAGNOSIS — Z136 Encounter for screening for cardiovascular disorders: Secondary | ICD-10-CM | POA: Diagnosis not present

## 2018-04-29 DIAGNOSIS — J301 Allergic rhinitis due to pollen: Secondary | ICD-10-CM | POA: Diagnosis not present

## 2018-04-29 DIAGNOSIS — R030 Elevated blood-pressure reading, without diagnosis of hypertension: Secondary | ICD-10-CM | POA: Diagnosis not present

## 2018-04-29 DIAGNOSIS — Z0000 Encounter for general adult medical examination without abnormal findings: Secondary | ICD-10-CM | POA: Diagnosis not present

## 2018-04-29 DIAGNOSIS — G44209 Tension-type headache, unspecified, not intractable: Secondary | ICD-10-CM | POA: Diagnosis not present

## 2018-04-29 DIAGNOSIS — M25511 Pain in right shoulder: Secondary | ICD-10-CM | POA: Diagnosis not present

## 2018-04-29 DIAGNOSIS — F329 Major depressive disorder, single episode, unspecified: Secondary | ICD-10-CM | POA: Diagnosis not present

## 2018-04-29 DIAGNOSIS — R229 Localized swelling, mass and lump, unspecified: Secondary | ICD-10-CM | POA: Diagnosis not present

## 2018-04-29 NOTE — Patient Outreach (Signed)
[name]) Care Management  04/29/2018  Paula [name] 1961/07/02 [id number]   Telephone Screen  Referral Date: 04/29/18 Referral Source: Urgent [facility] UM Dept. Referral Reason: " member needs medication co pay assistance with Symbicort inhaler prescribed at pulmonary follow up following ER visit" Insurance: [employer]TA   Outreach attempt # 1 to patient. Patient voices that she is doing fairly well. He went t follow up MD appt today. She states that MD is calling her in something for anxiety. She is concerned about how much that med will cost in addition to her other meds. She states that she is currently 15 or more meds. Due to her breathing issues she was recently placed on steroids, antibiotics and inhaler(Symbicort). Patient reports that she is unable to afford her inhaler. Per patient report, she was given a two week supply of inhaler. Patient states that she does not feel like the med is helping/working. She reports that she has voiced this and was told to continue taking inhaler. Patient saw pulmonologist on last week and goes back for follow up in about a month. Per chart review, patient has PMH of OSA, GERD, chronic back pain, HLD and Fibromyalgia. She denies any issues or concerns managing her conditions. [facility] services discussed. Patient voices no RN and/or SW needs or concerns at this time.      Plan: RN CM will send [place] referral for possible med assistance and polypharmacy med review.    Enzo Montgomery, RN,BSN,CCM [place] [facility] Telephonic Care Management Coordinator Direct Phone: [phone] Toll Free: [phone] Fax: [phone]

## 2018-04-29 NOTE — Progress Notes (Signed)
Reviewed & agree with plan  

## 2018-05-01 ENCOUNTER — Telehealth: Payer: Self-pay | Admitting: Pharmacist

## 2018-05-01 ENCOUNTER — Other Ambulatory Visit: Payer: Self-pay | Admitting: Family Medicine

## 2018-05-01 DIAGNOSIS — M25511 Pain in right shoulder: Secondary | ICD-10-CM

## 2018-05-01 DIAGNOSIS — E2839 Other primary ovarian failure: Secondary | ICD-10-CM

## 2018-05-01 NOTE — Patient Outreach (Signed)
[facility]) Care Management  05/01/2018  Paula [name] [birth year] [id number]   57 year old female referred to [place] Management by [facility] Utilization Management Department.  [place] services requested for medication co-pay assistance with Symbicort.  PMHx includes, but not limited to, sleep apnea, GERD, anxiety, depression, diverticulitis, fibromyalgia, HLD, osteoarthritis, RLS, and urinary incontinence.    Noted recent ER visit and acute office visit for chronic cough, sinus drainage, SOB treated with antibiotics, steroids, albuterol, and antihistamines without improvement.  Patient initiated on Symbicort on 04/24/2018.    Unsuccessful call attempt #1 to [name] today.  I left a HIPAA compliant voicemail on her home phone.    Plan: I will call patient again next week unless I hear back from her beforehand.   Ralene Bathe, PharmD, [place] [phone]

## 2018-05-02 ENCOUNTER — Encounter (HOSPITAL_COMMUNITY): Payer: Self-pay

## 2018-05-02 ENCOUNTER — Emergency Department (HOSPITAL_COMMUNITY)
Admission: EM | Admit: 2018-05-02 | Discharge: 2018-05-02 | Disposition: A | Discharge status: Home / Self Care | Payer: PPO | Attending: Emergency Medicine | Admitting: Emergency Medicine

## 2018-05-02 ENCOUNTER — Other Ambulatory Visit: Payer: Self-pay

## 2018-05-02 ENCOUNTER — Emergency Department (HOSPITAL_COMMUNITY): Payer: PPO

## 2018-05-02 DIAGNOSIS — R0789 Other chest pain: Secondary | ICD-10-CM | POA: Insufficient documentation

## 2018-05-02 DIAGNOSIS — M4802 Spinal stenosis, cervical region: Secondary | ICD-10-CM | POA: Diagnosis not present

## 2018-05-02 DIAGNOSIS — Z87891 Personal history of nicotine dependence: Secondary | ICD-10-CM | POA: Insufficient documentation

## 2018-05-02 DIAGNOSIS — R51 Headache: Secondary | ICD-10-CM | POA: Diagnosis not present

## 2018-05-02 DIAGNOSIS — M62838 Other muscle spasm: Secondary | ICD-10-CM

## 2018-05-02 DIAGNOSIS — Z79899 Other long term (current) drug therapy: Secondary | ICD-10-CM | POA: Diagnosis not present

## 2018-05-02 DIAGNOSIS — M25511 Pain in right shoulder: Secondary | ICD-10-CM | POA: Insufficient documentation

## 2018-05-02 DIAGNOSIS — M546 Pain in thoracic spine: Secondary | ICD-10-CM | POA: Diagnosis not present

## 2018-05-02 DIAGNOSIS — M542 Cervicalgia: Secondary | ICD-10-CM | POA: Diagnosis not present

## 2018-05-02 LAB — CBC WITH DIFFERENTIAL/PLATELET
BASOS ABS: 0 10*3/uL (ref 0.0–0.1)
Basophils Relative: 0 %
EOS PCT: 4 %
Eosinophils Absolute: 0.3 10*3/uL (ref 0.0–0.7)
HCT: 38.9 % (ref 36.0–46.0)
HEMOGLOBIN: 12.5 g/dL (ref 12.0–15.0)
LYMPHS ABS: 3.1 10*3/uL (ref 0.7–4.0)
LYMPHS PCT: 38 %
MCH: 28.9 pg (ref 26.0–34.0)
MCHC: 32.1 g/dL (ref 30.0–36.0)
MCV: 89.8 fL (ref 78.0–100.0)
Monocytes Absolute: 0.5 10*3/uL (ref 0.1–1.0)
Monocytes Relative: 7 %
NEUTROS PCT: 51 %
Neutro Abs: 4.2 10*3/uL (ref 1.7–7.7)
PLATELETS: 371 10*3/uL (ref 150–400)
RBC: 4.33 MIL/uL (ref 3.87–5.11)
RDW: 12.3 % (ref 11.5–15.5)
WBC: 8.2 10*3/uL (ref 4.0–10.5)

## 2018-05-02 LAB — I-STAT CHEM 8, ED
BUN: 9 mg/dL (ref 6–20)
CHLORIDE: 106 mmol/L (ref 101–111)
Calcium, Ion: 1.16 mmol/L (ref 1.15–1.40)
Creatinine, Ser: 0.6 mg/dL (ref 0.44–1.00)
Glucose, Bld: 115 mg/dL — ABNORMAL HIGH (ref 65–99)
HEMATOCRIT: 38 % (ref 36.0–46.0)
Hemoglobin: 12.9 g/dL (ref 12.0–15.0)
Potassium: 4 mmol/L (ref 3.5–5.1)
SODIUM: 140 mmol/L (ref 135–145)
TCO2: 24 mmol/L (ref 22–32)

## 2018-05-02 LAB — I-STAT TROPONIN, ED: Troponin i, poc: 0.01 ng/mL (ref 0.00–0.08)

## 2018-05-02 MED ORDER — DIAZEPAM 5 MG PO TABS: 5.0000 mg | ORAL_TABLET | Freq: Three times a day (TID) | ORAL | 0 refills | Status: DC | PRN

## 2018-05-02 MED ORDER — LORAZEPAM 2 MG/ML IJ SOLN
2.0000 mg | Freq: Once | INTRAMUSCULAR | Status: AC
  Administered 2018-05-02: 2 mg via INTRAVENOUS
  Filled 2018-05-02: qty 1

## 2018-05-02 NOTE — ED Notes (Signed)
Unable to get blood sample to pull pack from 22G IV. Asked Apolonio Schneiders NT to attempt straight stick.

## 2018-05-02 NOTE — ED Notes (Signed)
Pt transported to radiology.

## 2018-05-02 NOTE — ED Provider Notes (Signed)
[facility] DEPT Provider Note   CSN: [id number] Arrival date & time: 05/02/18  0700     History   Chief Complaint Chief Complaint  Patient presents with  . Neck Pain    HPI Paula [name] is a 57 y.o. female.  HPI   The patient presents for evaluation of pain in her face, right neck, right shoulder and right anterior chest.  This pain has been present for greater than 1 week and is worsening.  There has been no trauma.  During the time of this pain she has been evaluated in the emergency department, and by her PCP.  Her PCP previously has ordered an MRI of her right shoulder to evaluate for rotator cuff tear, because of failed treatment for right shoulder pain with multiple doses of prednisone.  On her last PCP visit she was started on medications to help with cough and possible allergies including Symbicort, and Claritin.  She was advised to continue taking her Flonase, Protonix, Pepcid and OTC cough medicine.  She was referred to see her pulmonologist in 2 months.  The patient reports that her tramadol which she is taking her pain has not improved her discomfort.  She denies shortness of breath, cough, nausea, vomiting, focal weakness or paresthesia.  There are no other known modifying factors.  Past Medical History:  Diagnosis Date  . Anxiety   . Chronic bronchitis ([place])   . Chronic lower back pain   . Depression   . Diverticulitis   . Diverticulosis of colon   . Expressive aphasia 11/15/2016  . Fibromyalgia   . GERD (gastroesophageal reflux disease)   . High cholesterol   . History of gastric ulcer   . History of hiatal hernia   . History of kidney stones   . History of thyroid nodule   . Hyperlipidemia   . IBS (irritable bowel syndrome)   . Intermittent vertigo   . Migraine    "a few times/year now; maybe" (11/15/2016)  . OSA on CPAP    wears CPAP nightly ;study in epic 02-01-2014 (11/15/2016)  . Osteoarthritis    "knees, hands, back,  hips" (11/15/2016)  . Pneumonia X 1   hx of  . Psychogenic tremor    intermittant head tremor-(11/15/2016)  . Refusal of blood transfusions as patient is Jehovah's Witness   . RLS (restless legs syndrome)   . Urinary incontinence     Patient Active Problem List   Diagnosis Date Noted  . Depression, major 03/17/2018  . Chronic pain syndrome 03/17/2018  . Expressive aphasia 11/15/2016  . Cough 11/08/2015  . GERD (gastroesophageal reflux disease) 11/08/2015  . B12 deficiency 12/07/2014  . Wart viral 11/07/2014  . Coarse tremors 02/09/2014  . OSA on CPAP 01/28/2014  . Psychosomatic disorder 01/14/2014  . Dyspnea 01/12/2014  . Tremor of face and hands 01/12/2014  . Breast pain in female 02/09/2013  . Elevated WBC count 10/12/2012  . Abdominal pain, other specified site 10/09/2012  . Abdominal bloating 10/09/2012  . Ovarian cystic mass 05/25/2012  . Shoulder pain, right 03/11/2012  . Right arm pain 12/30/2011  . ACNE, ROSACEA 07/17/2010  . LEG PAIN 07/17/2010  . NECK PAIN 05/10/2010  . ABDOMINAL PAIN, EPIGASTRIC 05/10/2010  . BURSITIS, LEFT HIP 05/01/2010  . PLANTAR FASCIITIS, BILATERAL 05/01/2010  . HYPERGLYCEMIA 01/05/2010  . PRURITUS 11/27/2009  . TOBACCO USE, QUIT 11/27/2009  . ANXIETY 11/22/2008  . FATIGUE 11/22/2008  . Essential hypertension 10/07/2008  . Insomnia, unspecified 08/30/2008  .  Obesity 08/19/2008  . NAUSEA ALONE 05/02/2008  . VERTIGO 01/19/2008  . DIARRHEA 01/19/2008  . THYROID NODULE 06/23/2007  . HYPERLIPIDEMIA 06/23/2007  . Situational depression 06/23/2007  . ADD 06/23/2007  . TMJ SYNDROME 06/23/2007  . GERD 06/23/2007  . Irritable bowel syndrome 06/23/2007  . Headache 06/23/2007    Past Surgical History:  Procedure Laterality Date  . ABDOMINAL HYSTERECTOMY  1990  . ANTERIOR CERVICAL DECOMP/DISCECTOMY FUSION  02-10-2004   C4 -- C7  . BACK SURGERY    . CARDIOVASCULAR STRESS TEST  2017   Negative  . CESAREAN SECTION  1977; 1982  .  COLONOSCOPY    . CYSTOSCOPY W/ RETROGRADES Right 06/07/2015   Procedure: CYSTOSCOPY WITH RETROGRADE PYELOGRAM;  Surgeon: Ardis Hughs, MD;  Location: [facility];  Service: Urology;  Laterality: Right;  . CYSTOSCOPY WITH URETEROSCOPY AND STENT PLACEMENT Right 06/07/2015   Procedure: RIGHT URETEROSCOPY, LASER LITHOTRIPSY, STONE REMOVAL AND RIGHT URETERAL STENT PLACEMENT;  Surgeon: Ardis Hughs, MD;  Location[facility];  Service: Urology;  Laterality: Right;  . HERNIA REPAIR    . HOLMIUM LASER APPLICATION N/A 04/09/5637   Procedure: HOLMIUM LASER APPLICATION;  Surgeon: Ardis Hughs, MD;  Location: [facility];  Service: Urology;  Laterality: N/A;  . LAPAROSCOPIC CHOLECYSTECTOMY  10-07-2000  . LIPOMA EXCISION  01/2017   from neck  . UMBILICAL HERNIA REPAIR  age 87  . UPPER GI ENDOSCOPY       OB History    Gravida  3   Para  2   Term  2   Preterm      AB  1   Living  2     SAB      TAB  1   Ectopic      Multiple      Live Births               Home Medications    Prior to Admission medications   Medication Sig Start Date End Date Taking? Authorizing Provider  albuterol (PROVENTIL HFA;VENTOLIN HFA) 108 (90 Base) MCG/ACT inhaler Inhale 1-2 puffs into the lungs every 6 (six) hours as needed for wheezing or shortness of breath.   Yes [provider]  benzonatate (TESSALON) 200 MG capsule Take 1 capsule (200 mg total) by mouth 3 (three) times daily as needed for cough. 04/24/18 04/24/19 Yes Parrett, Tammy S, NP  budesonide-formoterol (SYMBICORT) 80-4.5 MCG/ACT inhaler Inhale 2 puffs into the lungs 2 (two) times daily. 04/24/18  Yes Parrett, Tammy S, NP  cyclobenzaprine (FLEXERIL) 10 MG tablet Take 10 mg by mouth at bedtime as needed for muscle spasms.   Yes [provider]  fluticasone (FLONASE) 50 MCG/ACT nasal spray Place 1 spray into both nostrils daily as needed for allergies. 03/18/18  Yes  [provider]  hydroxypropyl methylcellulose / hypromellose (ISOPTO TEARS / GONIOVISC) 2.5 % ophthalmic solution Place 1 drop into both eyes at bedtime.   Yes [provider]  pantoprazole (PROTONIX) 40 MG tablet Take 40 mg by mouth 2 (two) times daily before a meal.   Yes [provider]  traMADol (ULTRAM) 50 MG tablet Take 50 mg by mouth every 6 (six) hours as needed for moderate pain.   Yes [provider]  atorvastatin (LIPITOR) 20 MG tablet Take 20 mg by mouth daily. 05/01/18   [provider]  budesonide-formoterol (SYMBICORT) 80-4.5 MCG/ACT inhaler Inhale 2 puffs into the lungs 2 (two) times daily. Patient  not taking: Reported on 05/02/2018 04/24/18   Parrett, Fonnie Mu, NP  diazepam (VALIUM) 5 MG tablet Take 1 tablet (5 mg total) by mouth every 8 (eight) hours as needed for muscle spasms (of the neck and shoulder). 05/02/18   Daleen Bo, MD  FLUoxetine (PROZAC) 10 MG capsule Take 10 mg by mouth daily. 04/29/18   [provider]    Family History Family History  Problem Relation Age of Onset  . Colon cancer Maternal Grandfather   . Hypertension Maternal Grandfather   . Cancer Maternal Grandfather        lung  . Hyperlipidemia Mother   . Hypertension Mother   . Cancer Maternal Aunt        lung  . Hypertension Sister   . Hypertension Maternal Grandmother   . Hypertension Brother     Social History Social History   Tobacco Use  . Smoking status: Former Smoker    Packs/day: 0.50    Years: 25.00    Pack years: 12.50    Types: Cigarettes    Last attempt to quit: 07/24/2008    Years since quitting: 9.7  . Smokeless tobacco: Never Used  Substance Use Topics  . Alcohol use: Yes    Alcohol/week: 0.0 oz    Comment: 11/15/2016 "might have 1 drink/month; if that"  . Drug use: No     Allergies   Detrol [tolterodine]; Gabapentin; Aspirin; Contrast media [iodinated diagnostic agents]; Hydrocodone-acetaminophen; Imitrex  [sumatriptan]; Oxycodone; and Avelox [moxifloxacin]   Review of Systems Review of Systems  All other systems reviewed and are negative.    Physical Exam Updated Vital Signs BP (!) 159/109 (BP Location: Left Arm)   Pulse 70   Temp 98.6 F (37 C) (Oral)   Resp 18   SpO2 100%   Physical Exam  Constitutional: She is oriented to person, place, and time. She appears well-developed. She appears distressed (She has a persistent sour look on her face.).  Morbidly obese  HENT:  Head: Normocephalic and atraumatic.  Eyes: Pupils are equal, round, and reactive to light. Conjunctivae and EOM are normal.  Neck: Normal range of motion and phonation normal. Neck supple.  Cardiovascular: Normal rate and regular rhythm.  Pulmonary/Chest: Effort normal and breath sounds normal. No stridor. No respiratory distress. She has no wheezes. She exhibits no tenderness.  Abdominal: Soft. She exhibits no distension. There is no tenderness. There is no guarding.  Musculoskeletal: Normal range of motion.  She guards against movement of the right shoulder secondary to pain.  There is very mild peripheral edema in the right hand.  The chest is moderately tender to palpation right anterior without crepitation or deformity.  There is tenderness of the posterior cervical spine primarily right side and limitation of motion of the neck secondary to discomfort.  There is no tenderness to palpation of the thoracic or lumbar spine regions.  Neurological: She is alert and oriented to person, place, and time. She exhibits normal muscle tone.  Skin: Skin is warm and dry.  Psychiatric: She has a normal mood and affect. Her behavior is normal. Judgment and thought content normal.  Nursing note and vitals reviewed.    ED Treatments / Results  Labs (all labs ordered are listed, but only abnormal results are displayed) Labs Reviewed  I-STAT CHEM 8, ED - Abnormal; Notable for the following components:      Result Value    Glucose, Bld 115 (*)    All other components within normal limits  CBC WITH DIFFERENTIAL/PLATELET  I-STAT TROPONIN, ED    EKG None  Radiology Dg Chest 2 View  Result Date: 05/02/2018 CLINICAL DATA:  Right neck and upper thoracic spine pain for 1 week. No known injury. EXAM: CHEST - 2 VIEW COMPARISON:  CT chest and PA and lateral chest 04/23/2018. FINDINGS: The lungs are clear. Lung volumes are somewhat low. No pneumothorax or pleural effusion. Heart size is mildly enlarged. No acute bony abnormality. IMPRESSION: Mild cardiomegaly without acute disease. Electronically Signed   By: Inge Rise M.D.   On: 05/02/2018 09:51   Ct Cervical Spine Wo Contrast  Result Date: 05/02/2018 CLINICAL DATA:  RIGHT-sided neck pain.  No trauma. EXAM: CT CERVICAL SPINE WITHOUT CONTRAST TECHNIQUE: Multidetector CT imaging of the cervical spine was performed without intravenous contrast. Multiplanar CT image reconstructions were also generated. COMPARISON:  CT neck dated 05/25/2014. plain film of the cervical spine dated 05/10/2010. FINDINGS: Alignment: Stable alignment. No evidence of acute vertebral body subluxation. Skull base and vertebrae: No fracture line or displaced fracture fragment seen. No acute or suspicious osseous lesion. Fusion hardware appears intact and stable in alignment at the C4 through C7 levels. Soft tissues and spinal canal: C4 through C7 levels of the central canal are not well seen due to metallic artifact emanating from patient's fusion hardware. No prevertebral fluid or swelling. No visible central canal hematoma. Disc levels: There is a combination of disc-osteophytic bulge and ossification of the posterior longitudinal ligament at the C3-4 level causing moderate central canal stenosis with possible mass effect on the cervical cord. There is additional degenerative uncovertebral and facet joint hypertrophy on the RIGHT at the C3-4 level causing mild RIGHT neural foramen narrowing. Upper  chest: Negative. Other: Bilateral carotid atherosclerosis. IMPRESSION: 1. No acute findings. 2. Combination of disc-osteophytic bulge and ossification of the posterior longitudinal ligament at the C3-4 level causing moderate central canal stenosis with possible mass effect on the cervical cord. 3. Fusion hardware appears intact and stable in alignment at the C4 through C7 levels. Metallic artifact from the hardware limits characterization of the central canal at the C4 through C7 levels. 4. Bilateral carotid atherosclerosis, appearing mild in degree. Electronically Signed   By: Franki Cabot M.D.   On: 05/02/2018 10:08    Procedures Procedures (including critical care time)  Medications Ordered in ED Medications  LORazepam (ATIVAN) injection 2 mg (2 mg Intravenous Given 05/02/18 0923)     Initial Impression / Assessment and Plan / ED Course  I have reviewed the triage vital signs and the nursing notes.  Pertinent labs & imaging results that were available during my care of the patient were reviewed by me and considered in my medical decision making (see chart for details).      Patient Vitals for the past 24 hrs:  BP Temp Temp src Pulse Resp SpO2  05/02/18 0726 (!) 159/109 98.6 F (37 C) Oral 70 18 100 %    10:52 AM Reevaluation with update and discussion. After initial assessment and treatment, an updated evaluation reveals she is more comfortable now resting semirecumbent, appears less concerned and converses easily.  Findings discussed with patient and family members, all questions answered. Daleen Bo   Medical Decision Making: Nonspecific neck, face, right shoulder and arm pain.  Cervical spinal stenosis present, above prior surgical site.  Previously had anterior cervical fusion.  Patient recently treated with prednisone, multiple doses for suspected shoulder inflammatory/injury process.  She had symptomatic improvement with benzodiazepine, therefore likely has a  component of  muscle spasm contributing to her discomfort.  Doubt spinal myelopathy, acute infectious process, or fracture.  She is stable for discharge with outpatient management by both PCP, MRI right shoulder pending for tomorrow, and neurosurgical follow-up.  CRITICAL CARE-no Performed by: Daleen Bo   Nursing Notes Reviewed/ Care Coordinated Applicable Imaging Reviewed Interpretation of Laboratory Data incorporated into ED treatment  The patient appears reasonably screened and/or stabilized for discharge and I doubt any other medical condition or other [facility] requiring further screening, evaluation, or treatment in the ED at this time prior to discharge.  Plan: Home Medications-continue current medications; Home Treatments-heat to affected area; return here if the recommended treatment, does not improve the symptoms; Recommended follow up-MRI tomorrow, PCP follow-up afterwards; neurosurgery follow-up for evaluation of cervical spinal stenosis, 1 to 2 weeks.     Final Clinical Impressions(s) / ED Diagnoses   Final diagnoses:  Muscle spasms of neck  Spinal stenosis, cervical region    ED Discharge Orders        Ordered    diazepam (VALIUM) 5 MG tablet  Every 8 hours PRN     05/02/18 1052       Daleen Bo, MD 05/02/18 1058

## 2018-05-02 NOTE — Discharge Instructions (Addendum)
The discomfort in your neck and face and shoulder will likely improve with time and treatments including heat and muscle relaxer medication.  The discomfort may be associated with spinal stenosis in the cervical spine.  To evaluate this, please see Dr. Christella Noa, your neurosurgeon, in 1 or 2 weeks.  Also, continue to follow-up with your primary care doctor and get the MRI of your shoulder, done as scheduled tomorrow.

## 2018-05-02 NOTE — ED Triage Notes (Signed)
She c/o non-traumatic right-sided neck and upper thoracic pain x 1 week she is in no distress.

## 2018-05-03 ENCOUNTER — Ambulatory Visit
Admission: RE | Admit: 2018-05-03 | Discharge: 2018-05-03 | Disposition: A | Discharge status: Home / Self Care | Payer: PPO | Source: Ambulatory Visit | Attending: Family Medicine | Admitting: Family Medicine

## 2018-05-03 DIAGNOSIS — M25511 Pain in right shoulder: Secondary | ICD-10-CM

## 2018-05-03 DIAGNOSIS — M19011 Primary osteoarthritis, right shoulder: Secondary | ICD-10-CM | POA: Diagnosis not present

## 2018-05-05 ENCOUNTER — Other Ambulatory Visit: Payer: Self-pay | Admitting: Pharmacist

## 2018-05-05 ENCOUNTER — Ambulatory Visit: Payer: Self-pay | Admitting: Pharmacist

## 2018-05-05 NOTE — Patient Outreach (Signed)
[facility]) Care Management  05/05/2018  Paula [name] 1961-09-18 [id number]   57 year old female referred to [place] Management by [facility] Utilization Management Department.  [place] services requested for medication co-pay assistance with Symbicort.  PMHx includes, but not limited to, sleep apnea, GERD, anxiety, depression, diverticulitis, fibromyalgia, HLD, osteoarthritis, RLS, and urinary incontinence.    Noted recent ER visit and acute office visit for chronic cough, sinus drainage, SOB treated with antibiotics, steroids, albuterol, and antihistamines without improvement.  Patient initiated on Symbicort on 04/24/2018.   Unsuccessful 2nd call attempt to [name] regarding medication assistance. I will sent patient a letter describing [facility] services and follow-up again in 3-4 business days.   Ralene Bathe, PharmD, [place] [phone]

## 2018-05-08 DIAGNOSIS — M542 Cervicalgia: Secondary | ICD-10-CM | POA: Diagnosis not present

## 2018-05-08 DIAGNOSIS — M545 Low back pain: Secondary | ICD-10-CM | POA: Diagnosis not present

## 2018-05-11 ENCOUNTER — Other Ambulatory Visit: Payer: Self-pay | Admitting: Pharmacist

## 2018-05-11 ENCOUNTER — Ambulatory Visit: Payer: Self-pay | Admitting: Pharmacist

## 2018-05-11 NOTE — Patient Outreach (Signed)
[name]) Care Management  05/11/2018  Paula [name] 1961/04/09 [id number]   Successful call placed to [name] regarding medication assistance.  HIPAA identifiers verified. [name] reports she has been feeling terrible over the last several weeks and is on her 3rd course of steroids in addition to antibiotics, OTC cough syrup, and the inhalers.  She reports she took Albuterol and Symbicort for 2 weeks and did not notice any improvement in her symptoms so she stopped the inhalers.    We reviewed mechanism of action of Symbicort and need for continued administration for medication to work however patient states that she does not want to take it at this time.    We reviewed possibly patient assistance options for Symbicort including Extra Help, [place]&Me PAP, and medication substitution to [facility] as this PAP does not require TROOP.  Patient voiced understanding but states she has another 4 week supply of Symbicort at this time and does not think she needs to continue it at all.  She declines medication assistance needs and [facility] pharmacy services at this time.   Patient has an appointment with her pulmonologist in July and reports she sees her PCP next week for follow-up.  I provided my phone number to patient if she needs to reach out to me in the future regarding medication management or medication assistance.    Plan: I will close [facility] pharmacy case at this time.   Ralene Bathe, PharmD, [place] [phone]

## 2018-05-13 DIAGNOSIS — F418 Other specified anxiety disorders: Secondary | ICD-10-CM | POA: Diagnosis not present

## 2018-05-13 DIAGNOSIS — I10 Essential (primary) hypertension: Secondary | ICD-10-CM | POA: Diagnosis not present

## 2018-05-13 DIAGNOSIS — J029 Acute pharyngitis, unspecified: Secondary | ICD-10-CM | POA: Diagnosis not present

## 2018-05-21 DIAGNOSIS — Z6841 Body Mass Index (BMI) 40.0 and over, adult: Secondary | ICD-10-CM | POA: Diagnosis not present

## 2018-05-21 DIAGNOSIS — R29898 Other symptoms and signs involving the musculoskeletal system: Secondary | ICD-10-CM | POA: Diagnosis not present

## 2018-05-21 DIAGNOSIS — M2550 Pain in unspecified joint: Secondary | ICD-10-CM | POA: Diagnosis not present

## 2018-05-21 DIAGNOSIS — M5136 Other intervertebral disc degeneration, lumbar region: Secondary | ICD-10-CM | POA: Diagnosis not present

## 2018-05-29 DIAGNOSIS — E669 Obesity, unspecified: Secondary | ICD-10-CM | POA: Diagnosis not present

## 2018-05-29 DIAGNOSIS — F418 Other specified anxiety disorders: Secondary | ICD-10-CM | POA: Diagnosis not present

## 2018-05-29 DIAGNOSIS — I10 Essential (primary) hypertension: Secondary | ICD-10-CM | POA: Diagnosis not present

## 2018-05-29 DIAGNOSIS — K219 Gastro-esophageal reflux disease without esophagitis: Secondary | ICD-10-CM | POA: Diagnosis not present

## 2018-05-29 DIAGNOSIS — J302 Other seasonal allergic rhinitis: Secondary | ICD-10-CM | POA: Diagnosis not present

## 2018-06-15 ENCOUNTER — Ambulatory Visit: Payer: Self-pay | Admitting: Pulmonary Disease

## 2018-06-19 ENCOUNTER — Other Ambulatory Visit: Payer: Self-pay

## 2018-06-25 ENCOUNTER — Encounter: Payer: Self-pay | Admitting: Pulmonary Disease

## 2018-06-25 ENCOUNTER — Ambulatory Visit: Payer: PPO | Admitting: Pulmonary Disease

## 2018-06-25 DIAGNOSIS — G4733 Obstructive sleep apnea (adult) (pediatric): Secondary | ICD-10-CM

## 2018-06-25 DIAGNOSIS — Z9989 Dependence on other enabling machines and devices: Secondary | ICD-10-CM | POA: Diagnosis not present

## 2018-06-25 DIAGNOSIS — R05 Cough: Secondary | ICD-10-CM | POA: Diagnosis not present

## 2018-06-25 DIAGNOSIS — R059 Cough, unspecified: Secondary | ICD-10-CM

## 2018-06-25 MED ORDER — DEXLANSOPRAZOLE 60 MG PO CPDR: 60.0000 mg | DELAYED_RELEASE_CAPSULE | Freq: Every day | ORAL | 2 refills | Status: DC

## 2018-06-25 NOTE — Assessment & Plan Note (Signed)
CPAP seems to be working well. Supplies will be renewed  Weight loss encouraged, compliance with goal of at least 4-6 hrs every night is the expectation. Advised against medications with sedative side effects Cautioned against driving when sleepy - understanding that sleepiness will vary on a day to day basis

## 2018-06-25 NOTE — Progress Notes (Signed)
   Subjective:    Patient ID: Melchor Amour, female    DOB: 1961/05/21, 57 y.o.   MRN: [id number]  HPI  57 year old woman for follow-up of OSA & chronic cough  She reports onset of cough in the spring, no URI symptoms at onset.  Initially normally productive of clear phlegm especially in the mornings.  She has received prednisone tapers x2-3, Symbicort without relief Delsym seems to help somewhat.  She has been on Protonix for some time, Zantac and Prilosec were not working.  She has been evaluated by GI for reflux, and upper GI series has demonstrated this back in 2016.  Initially cough was attributed to allergies but she is now tired of coughing.  Had an ER visit in 04/2018 and CT angiogram was negative for PE.  Denies daytime somnolence or fatigue, CPAP is working well.  She is settled down with her mask. Download was reviewed which shows excellent control of events on average pressure of 12 cm, auto settings with good compliance more than 7 hours on days used  Significant tests/ events reviewed  PSG 2002 >> CPAP 9cm with a nasal mask PSG 01/2014 - AHI 8/h, RDI 2/h , lowest 79% Placed on autoCPAP with pillows ([facility]) >>avg pr 11 cm    PFTs 11/2015 FEV1 at 99%, ratio 92, no significant bronchodilator response, FVC 85%. DLCO 82%. Spirometry 04/2018  moderate restriction with FEV1 at 71%, ratio 89, FVC 63%.  UGI series 10/2015 >>Esophageal dysmotility with distal barium stasis, likely secondary to reflux. Reflux to the lower esophagus was documented  CT chest 04/2018 showed clear lungs negative for PE.  FENO 9 ppb .     Review of Systems neg for any significant sore throat, dysphagia, itching, sneezing, nasal congestion or excess/ purulent secretions, fever, chills, sweats, unintended wt loss, pleuritic or exertional cp, hempoptysis, orthopnea pnd or change in chronic leg swelling.   Also denies presyncope, palpitations, heartburn, abdominal pain, nausea, vomiting, diarrhea or  change in bowel or urinary habits, dysuria,hematuria, rash, arthralgias, visual complaints, headache, numbness weakness or ataxia.     Objective:   Physical Exam  Gen. Pleasant, obese, in no distress ENT - no thrush, no post nasal drip Neck: No JVD, no thyromegaly, no carotid bruits Lungs: no use of accessory muscles, no dullness to percussion, decreased without rales or rhonchi  Cardiovascular: Rhythm regular, heart sounds  normal, no murmurs or gallops, no peripheral edema Musculoskeletal: No deformities, no cyanosis or clubbing , no tremors       Assessment & Plan:

## 2018-06-25 NOTE — Patient Instructions (Signed)
Cough may be related to sinus drip or reflux.  Trial of Dexilant once daily# 30 x 2 refills. Until then, use Protonix twice daily.  For sinus drip, use Chlor-Trimeton 4 mg  daily at bedtime for 3 weeks take store brand Sudafed -phenylephrine 10 mg in daytime for 1 week  Take Delsym 5 mL 3 times daily for 2 weeks.  Report back in 2 to 3 weeks with improvement and based on this we will obtain ENT consult

## 2018-06-25 NOTE — Assessment & Plan Note (Signed)
Cough may be related to sinus drip or reflux.  Trial of Dexilant once daily# 30 x 2 refills. Until then, use Protonix twice daily.  For sinus drip, use Chlor-Trimeton 4 mg  daily at bedtime for 3 weeks take store brand Sudafed -phenylephrine 10 mg in daytime for 1 week  Take Delsym 5 mL 3 times daily for 2 weeks.  Report back in 2 to 3 weeks with improvement and based on this we will obtain ENT consult & UGI series

## 2018-07-07 NOTE — Progress Notes (Signed)
Cardiology Office Note   Date:  07/08/2018   ID:  Paula [name], DOB 25-Oct-1961, MRN [id number]  PCP:  Carol Ada, MD  Cardiologist:  [facility]  Chief Complaint  Patient presents with  . Palpitations     History of Present Illness: Paula [name] is a 57 y.o. female who presents for ongoing assessment and management of dyspnea and palpitations. Most recent NM stress test in 2017 was negative for ischemia. Chest pain is found to be atypical Other history includes GERD, Anxiety, and OSA on CPAP. No further cardiac work up was planned.   She complains of worsening dyspnea and rapid HR. She states that she is compliant with CPAP. She has gained 30 lbs this year, suddenly. She is easily dyspneic with ambulation. She admits that being overweight is contributing but states that this does not appear to be the only reason.   She is under a lot of stress. Husband just had a MI, mother has moved in with them and has a lot of health issues.   Past Medical History:  Diagnosis Date  . Anxiety   . Chronic bronchitis ([place])   . Chronic lower back pain   . Depression   . Diverticulitis   . Diverticulosis of colon   . Expressive aphasia 11/15/2016  . Fibromyalgia   . GERD (gastroesophageal reflux disease)   . High cholesterol   . History of gastric ulcer   . History of hiatal hernia   . History of kidney stones   . History of thyroid nodule   . Hyperlipidemia   . IBS (irritable bowel syndrome)   . Intermittent vertigo   . Migraine    "a few times/year now; maybe" (11/15/2016)  . OSA on CPAP    wears CPAP nightly ;study in epic 02-01-2014 (11/15/2016)  . Osteoarthritis    "knees, hands, back, hips" (11/15/2016)  . Pneumonia X 1   hx of  . Psychogenic tremor    intermittant head tremor-(11/15/2016)  . Refusal of blood transfusions as patient is Jehovah's Witness   . RLS (restless legs syndrome)   . Urinary incontinence     Past Surgical History:  Procedure Laterality Date  .  ABDOMINAL HYSTERECTOMY  1990  . ANTERIOR CERVICAL DECOMP/DISCECTOMY FUSION  02-10-2004   C4 -- C7  . BACK SURGERY    . CARDIOVASCULAR STRESS TEST  2017   Negative  . CESAREAN SECTION  1977; 1982  . COLONOSCOPY    . CYSTOSCOPY W/ RETROGRADES Right 06/07/2015   Procedure: CYSTOSCOPY WITH RETROGRADE PYELOGRAM;  Surgeon: Ardis Hughs, MD;  Location: [facility];  Service: Urology;  Laterality: Right;  . CYSTOSCOPY WITH URETEROSCOPY AND STENT PLACEMENT Right 06/07/2015   Procedure: RIGHT URETEROSCOPY, LASER LITHOTRIPSY, STONE REMOVAL AND RIGHT URETERAL STENT PLACEMENT;  Surgeon: Ardis Hughs, MD;  Location: [facility];  Service: Urology;  Laterality: Right;  . HERNIA REPAIR    . HOLMIUM LASER APPLICATION N/A 07/02/8298   Procedure: HOLMIUM LASER APPLICATION;  Surgeon: Ardis Hughs, MD;  Location: [facility];  Service: Urology;  Laterality: N/A;  . LAPAROSCOPIC CHOLECYSTECTOMY  10-07-2000  . LIPOMA EXCISION  01/2017   from neck  . UMBILICAL HERNIA REPAIR  age 52  . UPPER GI ENDOSCOPY       Current Outpatient Medications  Medication Sig Dispense Refill  . albuterol (PROVENTIL HFA;VENTOLIN HFA) 108 (90 Base) MCG/ACT inhaler Inhale 1-2 puffs into the lungs every 6 (six) hours as needed for  wheezing or shortness of breath.    [name] atorvastatin (LIPITOR) 20 MG tablet Take 20 mg by mouth daily.    . budesonide-formoterol (SYMBICORT) 80-4.5 MCG/ACT inhaler Inhale 2 puffs into the lungs 2 (two) times daily. 1 Inhaler 5  . cyclobenzaprine (FLEXERIL) 10 MG tablet Take 10 mg by mouth at bedtime as needed for muscle spasms.    [name] dexlansoprazole (DEXILANT) 60 MG capsule Take 1 capsule (60 mg total) by mouth daily. 30 capsule 2  . fluticasone (FLONASE) 50 MCG/ACT nasal spray Place 1 spray into both nostrils daily as needed for allergies.  2  . pantoprazole (PROTONIX) 40 MG tablet Take 40 mg by mouth 2 (two) times daily before a meal.    . metoprolol  succinate (TOPROL XL) 25 MG 24 hr tablet Take 0.5 tablets (12.5 mg total) by mouth daily. 15 tablet 6   No current facility-administered medications for this visit.     Allergies:   Detrol [tolterodine]; Gabapentin; Aspirin; Contrast media [iodinated diagnostic agents]; Hydrocodone-acetaminophen; Imitrex [sumatriptan]; Oxycodone; and Avelox [moxifloxacin]    Social History:  The patient  reports that she quit smoking about 9 years ago. Her smoking use included cigarettes. She has a 12.50 pack-year smoking history. She has never used smokeless tobacco. She reports that she drinks alcohol. She reports that she does not use drugs.   Family History:  The patient's family history includes Cancer in her maternal aunt and maternal grandfather; Colon cancer in her maternal grandfather; Hyperlipidemia in her mother; Hypertension in her brother, maternal grandfather, maternal grandmother, mother, and sister.    ROS: All other systems are reviewed and negative. Unless otherwise mentioned in H&P    PHYSICAL EXAM: VS:  BP 133/86   Pulse 69   Ht 5\' 5"  (1.651 m)   Wt 260 lb (117.9 kg)   BMI 43.27 kg/m  , BMI Body mass index is 43.27 kg/m. GEN: Well nourished, well developed, in no acute distress Morbidly obese  HEENT: normal  Neck: no JVD, carotid bruits, or masses Cardiac: RRR; tachycardic,  no murmurs, rubs, or gallops,no edema  Respiratory:  clear to auscultation bilaterally, normal work of breathing GI: soft, nontender, nondistended, + BS MS: no deformity or atrophy  Skin: warm and dry, no rash Neuro:  Strength and sensation are intact Psych: euthymic mood, full affect   EKG:  NSR rate of  72 bpm.   Recent Labs: 04/22/2018: ALT 54 05/02/2018: BUN 9; Creatinine, Ser 0.60; Hemoglobin 12.9; Platelets 371; Potassium 4.0; Sodium 140    Lipid Panel    Component Value Date/Time   CHOL 282 (H) 11/16/2016 0039   TRIG 351 (H) 11/16/2016 0039   HDL 39 (L) 11/16/2016 0039   CHOLHDL 7.2  11/16/2016 0039   VLDL 70 (H) 11/16/2016 0039   LDLCALC 173 (H) 11/16/2016 0039   LDLDIRECT 170.1 01/08/2010 0943      Wt Readings from Last 3 Encounters:  07/08/18 260 lb (117.9 kg)  06/25/18 259 lb 6.4 oz (117.7 kg)  04/24/18 254 lb (115.2 kg)      Other studies Review Echocardiogram 11/16/2016   - Left ventricle: The cavity size was normal. Systolic function was   normal. The estimated ejection fraction was in the range of 55%   to 60%. Wall motion was normal; there were no regional wall   motion abnormalities.  Impressions:  - No cardiac source of embolism was identified, but cannot be ruled   out on the basis of this examination.  Recommendations:  Consider transesophageal echocardiography if clinically indicated.  Doppler Ultrasound: 11/16/2016  Bilateral: intimal wall thickening CCA. Mild mixed plaque origin ICA. 1-39% ICA plaquing. Vertebral artery flow is antegrade.  ASSESSMENT AND PLAN:  1. Dyspnea; Multifactorial. Obesity, OSA, deconditioning due to back pain. She is tachycardiac today on examination, EKG shows normal sinus but on exam HR 99-110 bpm. I will start her on low dose metoprolol succinate 12.5 mg daily. She is to see PCP today. They will be doing labs, to include TSH.   I will also place 2 week cardiac monitor to evaluate for PAF in the setting of OSA.   2. OSA: She is compliant with CPAP. Follows with pulmonary.   3. Moderate LVH: Noted on EKG. She has not had echo since 2017. Will recheck this for changes in LV function to assist in medical management.    Current medicines are reviewed at length with the patient today.    Labs/ tests ordered today include: Cardiac Monitor, Echocardiogram.   Phill Myron. West Pugh, ANP, AACC   07/08/2018 10:01 AM    [facility] [address], [place], [place] [address] Phone: [phone]; Fax: [phone]

## 2018-07-08 ENCOUNTER — Ambulatory Visit: Payer: PPO | Admitting: Adult Health

## 2018-07-08 ENCOUNTER — Encounter: Payer: Self-pay | Admitting: Adult Health

## 2018-07-08 VITALS — BP 133/86 | HR 69 | Ht 65.0 in | Wt 260.0 lb

## 2018-07-08 DIAGNOSIS — R000 Tachycardia, unspecified: Secondary | ICD-10-CM

## 2018-07-08 DIAGNOSIS — R635 Abnormal weight gain: Secondary | ICD-10-CM | POA: Diagnosis not present

## 2018-07-08 DIAGNOSIS — F439 Reaction to severe stress, unspecified: Secondary | ICD-10-CM | POA: Diagnosis not present

## 2018-07-08 DIAGNOSIS — R0609 Other forms of dyspnea: Secondary | ICD-10-CM

## 2018-07-08 DIAGNOSIS — R109 Unspecified abdominal pain: Secondary | ICD-10-CM | POA: Diagnosis not present

## 2018-07-08 DIAGNOSIS — I517 Cardiomegaly: Secondary | ICD-10-CM

## 2018-07-08 DIAGNOSIS — R0789 Other chest pain: Secondary | ICD-10-CM

## 2018-07-08 DIAGNOSIS — R140 Abdominal distension (gaseous): Secondary | ICD-10-CM | POA: Diagnosis not present

## 2018-07-08 DIAGNOSIS — R002 Palpitations: Secondary | ICD-10-CM

## 2018-07-08 DIAGNOSIS — R0602 Shortness of breath: Secondary | ICD-10-CM | POA: Diagnosis not present

## 2018-07-08 MED ORDER — METOPROLOL SUCCINATE ER 25 MG PO TB24: 12.5000 mg | ORAL_TABLET | Freq: Every day | ORAL | 6 refills | Status: DC

## 2018-07-08 NOTE — Patient Instructions (Signed)
Medication Instructions:  START METOPROLOL SUCCINATE 12.5MG    If you need a refill on your cardiac medications before your next appointment, please call your pharmacy.  Testing/Procedures: Event monitor-Your physician has recommended that you wear an  2 WEEK event monitor. This will be performed at our [facility] location - [address].  Event monitors are medical devices that record the heart's electrical activity. Doctors most often [place] these monitors to diagnose arrhythmias. Arrhythmias are problems with the speed or rhythm of the heartbeat. The monitor is a small, portable device. You can wear one while you do your normal daily activities. This is usually used to diagnose what is causing palpitations/syncope (passing out).  Echocardiogram - Your physician has requested that you have an echocardiogram. Echocardiography is a painless test that uses sound waves to create images of your heart. It provides your doctor with information about the size and shape of your heart and how well your heart's chambers and valves are working. This procedure takes approximately one hour. There are no restrictions for this procedure. This will be performed at our [facility] location - [address], [address].   Special Instructions: CALL IF YOU RACING HEART BEAT CONTINUE FOR MEDICATION ADJUSTMENT  Follow-Up: Your physician wants you to follow-up in: [place] ([place]), DNP,AACC IF PRIMARY CARDIOLOGIST IS UNAVAILABLE.    Thank you for choosing [facility] at [facility]!!

## 2018-07-09 DIAGNOSIS — Z9049 Acquired absence of other specified parts of digestive tract: Secondary | ICD-10-CM | POA: Diagnosis not present

## 2018-07-09 DIAGNOSIS — K219 Gastro-esophageal reflux disease without esophagitis: Secondary | ICD-10-CM | POA: Diagnosis not present

## 2018-07-09 DIAGNOSIS — K580 Irritable bowel syndrome with diarrhea: Secondary | ICD-10-CM | POA: Diagnosis not present

## 2018-07-09 DIAGNOSIS — R1011 Right upper quadrant pain: Secondary | ICD-10-CM | POA: Diagnosis not present

## 2018-07-11 ENCOUNTER — Other Ambulatory Visit: Payer: Self-pay | Admitting: Gastroenterology

## 2018-07-11 DIAGNOSIS — R1011 Right upper quadrant pain: Secondary | ICD-10-CM

## 2018-07-15 ENCOUNTER — Encounter (HOSPITAL_COMMUNITY): Payer: Self-pay | Admitting: Emergency Medicine

## 2018-07-15 ENCOUNTER — Emergency Department (HOSPITAL_COMMUNITY): Payer: PPO

## 2018-07-15 ENCOUNTER — Emergency Department (HOSPITAL_COMMUNITY)
Admission: EM | Admit: 2018-07-15 | Discharge: 2018-07-15 | Disposition: A | Discharge status: Home / Self Care | Payer: PPO | Attending: Emergency Medicine | Admitting: Emergency Medicine

## 2018-07-15 ENCOUNTER — Other Ambulatory Visit: Payer: Self-pay

## 2018-07-15 DIAGNOSIS — M5442 Lumbago with sciatica, left side: Secondary | ICD-10-CM | POA: Insufficient documentation

## 2018-07-15 DIAGNOSIS — M545 Low back pain: Secondary | ICD-10-CM | POA: Diagnosis not present

## 2018-07-15 DIAGNOSIS — Z79899 Other long term (current) drug therapy: Secondary | ICD-10-CM | POA: Diagnosis not present

## 2018-07-15 DIAGNOSIS — Z87891 Personal history of nicotine dependence: Secondary | ICD-10-CM | POA: Insufficient documentation

## 2018-07-15 LAB — CBC WITH DIFFERENTIAL/PLATELET
Basophils Absolute: 0 10*3/uL (ref 0.0–0.1)
Basophils Relative: 0 %
EOS ABS: 0.1 10*3/uL (ref 0.0–0.7)
Eosinophils Relative: 1 %
HCT: 38 % (ref 36.0–46.0)
Hemoglobin: 12.5 g/dL (ref 12.0–15.0)
LYMPHS ABS: 2.5 10*3/uL (ref 0.7–4.0)
Lymphocytes Relative: 27 %
MCH: 29.6 pg (ref 26.0–34.0)
MCHC: 32.9 g/dL (ref 30.0–36.0)
MCV: 90 fL (ref 78.0–100.0)
MONO ABS: 0.3 10*3/uL (ref 0.1–1.0)
Monocytes Relative: 4 %
Neutro Abs: 6.1 10*3/uL (ref 1.7–7.7)
Neutrophils Relative %: 68 %
Platelets: 375 10*3/uL (ref 150–400)
RBC: 4.22 MIL/uL (ref 3.87–5.11)
RDW: 12.3 % (ref 11.5–15.5)
WBC: 9 10*3/uL (ref 4.0–10.5)

## 2018-07-15 LAB — BASIC METABOLIC PANEL
Anion gap: 9 (ref 5–15)
BUN: 13 mg/dL (ref 6–20)
CO2: 26 mmol/L (ref 22–32)
CREATININE: 0.66 mg/dL (ref 0.44–1.00)
Calcium: 9.5 mg/dL (ref 8.9–10.3)
Chloride: 107 mmol/L (ref 98–111)
GFR calc Af Amer: >60 mL/min (ref >60)
GFR calc non Af Amer: >60 mL/min (ref >60)
GLUCOSE: 103 mg/dL — AB (ref 70–99)
POTASSIUM: 4.2 mmol/L (ref 3.5–5.1)
SODIUM: 142 mmol/L (ref 135–145)

## 2018-07-15 MED ORDER — HYDROMORPHONE HCL 1 MG/ML IJ SOLN: 1.0000 mg | Freq: Once | INTRAMUSCULAR | Status: DC

## 2018-07-15 MED ORDER — DIPHENHYDRAMINE HCL 50 MG/ML IJ SOLN
25.0000 mg | Freq: Once | INTRAMUSCULAR | Status: AC
  Administered 2018-07-15: 25 mg via INTRAVENOUS
  Filled 2018-07-15: qty 1

## 2018-07-15 MED ORDER — METHOCARBAMOL 500 MG PO TABS
1000.0000 mg | ORAL_TABLET | Freq: Once | ORAL | Status: AC
  Administered 2018-07-15: 1000 mg via ORAL
  Filled 2018-07-15: qty 2

## 2018-07-15 MED ORDER — HYDROMORPHONE HCL 1 MG/ML IJ SOLN
1.0000 mg | Freq: Once | INTRAMUSCULAR | Status: AC
  Administered 2018-07-15: 1 mg via INTRAMUSCULAR
  Filled 2018-07-15: qty 1

## 2018-07-15 MED ORDER — OXYCODONE-ACETAMINOPHEN 5-325 MG PO TABS
2.0000 | ORAL_TABLET | Freq: Once | ORAL | Status: AC
  Administered 2018-07-15: 2 via ORAL
  Filled 2018-07-15: qty 2

## 2018-07-15 MED ORDER — ONDANSETRON 4 MG PO TBDP
4.0000 mg | ORAL_TABLET | Freq: Once | ORAL | Status: AC
  Administered 2018-07-15: 4 mg via ORAL
  Filled 2018-07-15: qty 1

## 2018-07-15 MED ORDER — DIAZEPAM 5 MG PO TABS
5.0000 mg | ORAL_TABLET | Freq: Once | ORAL | Status: AC
  Administered 2018-07-15: 5 mg via ORAL
  Filled 2018-07-15: qty 1

## 2018-07-15 MED ORDER — SODIUM CHLORIDE 0.9 % IV BOLUS
1000.0000 mL | Freq: Once | INTRAVENOUS | Status: AC
Start: 2018-07-15 — End: 2018-07-15
  Administered 2018-07-15: 1000 mL via INTRAVENOUS

## 2018-07-15 MED ORDER — TRAMADOL HCL 50 MG PO TABS: 50.0000 mg | ORAL_TABLET | Freq: Four times a day (QID) | ORAL | 0 refills | Status: DC | PRN

## 2018-07-15 MED ORDER — METOCLOPRAMIDE HCL 5 MG/ML IJ SOLN
10.0000 mg | Freq: Once | INTRAMUSCULAR | Status: AC
Start: 2018-07-15 — End: 2018-07-15
  Administered 2018-07-15: 10 mg via INTRAVENOUS
  Filled 2018-07-15: qty 2

## 2018-07-15 MED ORDER — ONDANSETRON 4 MG PO TBDP: 4.0000 mg | ORAL_TABLET | Freq: Three times a day (TID) | ORAL | 0 refills | Status: DC | PRN

## 2018-07-15 MED ORDER — PROMETHAZINE HCL 25 MG PO TABS
12.5000 mg | ORAL_TABLET | Freq: Once | ORAL | Status: AC
  Administered 2018-07-15: 12.5 mg via ORAL
  Filled 2018-07-15: qty 1

## 2018-07-15 NOTE — ED Notes (Signed)
Pt ambulated to bathroom 

## 2018-07-15 NOTE — ED Triage Notes (Signed)
Pt reports that she had lower back pain since Sunday. Denies falls or injuries. Having hard stools but no urinary problems.

## 2018-07-15 NOTE — ED Provider Notes (Signed)
Received patient at signout from [facility].  Refer to provider note for full history and physical examination.  Briefly, patient is a 57 year old female with history of spinal stenosis, chronic low back pain, fibromyalgia presents for 3-day history of worsening back and left leg pain.  No red flag signs concerning for cauda equina or spinal abscess.  She has had minimal relief in the ED.  She did receive multiple doses of Dilaudid, p.o. Percocet, Robaxin.  She did develop nausea and vomiting while in the ED thought to be secondary to opioids.  She was given Zofran and Phenergan but has not been able to tolerate p.o. fluids since.  She has been able to ambulate while in the ED and is likely stable for discharge home as long as she can tolerate p.o.  Plan to discharge with tramadol which she can tolerate if taken with Zofran.  She has follow-up scheduled with her spine surgeon Dr. Lynann Bologna on Monday.  MDM   Lab work reviewed by me shows no leukocytosis, no anemia, no metabolic derangements.  Overall unremarkable.  She was given IV fluids, Reglan, Benadryl, and Valium while in the ED and upon reassessment she states she is feeling better.  She was able to tolerate a [place] sandwich and some ginger ale.  Ambulatory in the ED at this time.  Stable for discharge home with follow-up with her orthopedist on Monday.  Will discharge with tramadol and Zofran.  Discussed strict ED return precautions.  Patient and patient's husband verbalized understanding of and agreement with plan and patient is stable for discharge home at this time.      Debroah Baller 07/15/18 2342    Lacretia Leigh, MD 07/16/18 2230

## 2018-07-15 NOTE — ED Notes (Signed)
Patient ambulated to the restroom with 1 assist. Tech noted labored breathing and slow pace during ambulation. Patient wanted to use the wheelchair on the way back to the room. Pt stated she was in pain during ambulation.

## 2018-07-15 NOTE — ED Notes (Signed)
Pt wanting to stay in wheelchair at this time due to back pain and not wanting to move to get onto ED stretcher bed.

## 2018-07-15 NOTE — ED Provider Notes (Signed)
[facility] DEPT Provider Note   CSN: [id number] Arrival date & time: 07/15/18  0932     History   Chief Complaint Chief Complaint  Patient presents with  . Back Pain    HPI Paula [name] is a 57 y.o. female.  Patient with history of spinal stenosis presents with complaint of lower back and buttocks pain starting 3 days ago.  No new injuries.  Pains have been in the same location and feels similar to previous chronic back issues, however more severe.  Patient is having difficulty walking and moving because the pain gets worse when she raises or lifts her legs.  She has taken Tylenol and Flexeril without any improvement. She was unable to get an appointment with her orthopedist, prompting emergency department visit today because of severity of symptoms.  Patient denies warning symptoms of back pain including: fecal incontinence, urinary retention or overflow incontinence, night sweats, waking from sleep with back pain, unexplained fevers or weight loss, h/o cancer, IVDU, recent trauma. She has had steroids, including injections in the back, in the past without much improvement.  She is unable to take NSAIDs due to peptic ulcer disease history. The onset of this condition was acute. The course is constant. Aggravating factors: movement. Alleviating factors: certain positions.        Past Medical History:  Diagnosis Date  . Anxiety   . Chronic bronchitis ([place])   . Chronic lower back pain   . Depression   . Diverticulitis   . Diverticulosis of colon   . Expressive aphasia 11/15/2016  . Fibromyalgia   . GERD (gastroesophageal reflux disease)   . High cholesterol   . History of gastric ulcer   . History of hiatal hernia   . History of kidney stones   . History of thyroid nodule   . Hyperlipidemia   . IBS (irritable bowel syndrome)   . Intermittent vertigo   . Migraine    "a few times/year now; maybe" (11/15/2016)  . OSA on CPAP    wears CPAP  nightly ;study in epic 02-01-2014 (11/15/2016)  . Osteoarthritis    "knees, hands, back, hips" (11/15/2016)  . Pneumonia X 1   hx of  . Psychogenic tremor    intermittant head tremor-(11/15/2016)  . Refusal of blood transfusions as patient is Jehovah's Witness   . RLS (restless legs syndrome)   . Urinary incontinence     Patient Active Problem List   Diagnosis Date Noted  . Depression, major 03/17/2018  . Chronic pain syndrome 03/17/2018  . Expressive aphasia 11/15/2016  . Cough 11/08/2015  . GERD (gastroesophageal reflux disease) 11/08/2015  . B12 deficiency 12/07/2014  . Wart viral 11/07/2014  . Coarse tremors 02/09/2014  . OSA on CPAP 01/28/2014  . Psychosomatic disorder 01/14/2014  . Dyspnea 01/12/2014  . Tremor of face and hands 01/12/2014  . Breast pain in female 02/09/2013  . Elevated WBC count 10/12/2012  . Abdominal pain, other specified site 10/09/2012  . Abdominal bloating 10/09/2012  . Ovarian cystic mass 05/25/2012  . Shoulder pain, right 03/11/2012  . Right arm pain 12/30/2011  . ACNE, ROSACEA 07/17/2010  . LEG PAIN 07/17/2010  . NECK PAIN 05/10/2010  . ABDOMINAL PAIN, EPIGASTRIC 05/10/2010  . BURSITIS, LEFT HIP 05/01/2010  . PLANTAR FASCIITIS, BILATERAL 05/01/2010  . HYPERGLYCEMIA 01/05/2010  . PRURITUS 11/27/2009  . TOBACCO USE, QUIT 11/27/2009  . ANXIETY 11/22/2008  . FATIGUE 11/22/2008  . Essential hypertension 10/07/2008  . Insomnia, unspecified 08/30/2008  .  Obesity 08/19/2008  . NAUSEA ALONE 05/02/2008  . VERTIGO 01/19/2008  . DIARRHEA 01/19/2008  . THYROID NODULE 06/23/2007  . HYPERLIPIDEMIA 06/23/2007  . Situational depression 06/23/2007  . ADD 06/23/2007  . TMJ SYNDROME 06/23/2007  . GERD 06/23/2007  . Irritable bowel syndrome 06/23/2007  . Headache 06/23/2007    Past Surgical History:  Procedure Laterality Date  . ABDOMINAL HYSTERECTOMY  1990  . ANTERIOR CERVICAL DECOMP/DISCECTOMY FUSION  02-10-2004   C4 -- C7  . BACK SURGERY     . CARDIOVASCULAR STRESS TEST  2017   Negative  . CESAREAN SECTION  1977; 1982  . COLONOSCOPY    . CYSTOSCOPY W/ RETROGRADES Right 06/07/2015   Procedure: CYSTOSCOPY WITH RETROGRADE PYELOGRAM;  Surgeon: Ardis Hughs, MD;  Location: [facility];  Service: Urology;  Laterality: Right;  . CYSTOSCOPY WITH URETEROSCOPY AND STENT PLACEMENT Right 06/07/2015   Procedure: RIGHT URETEROSCOPY, LASER LITHOTRIPSY, STONE REMOVAL AND RIGHT URETERAL STENT PLACEMENT;  Surgeon: Ardis Hughs, MD;  Location: [facility];  Service: Urology;  Laterality: Right;  . HERNIA REPAIR    . HOLMIUM LASER APPLICATION N/A 03/09/5461   Procedure: HOLMIUM LASER APPLICATION;  Surgeon: Ardis Hughs, MD;  Location: [facility];  Service: Urology;  Laterality: N/A;  . LAPAROSCOPIC CHOLECYSTECTOMY  10-07-2000  . LIPOMA EXCISION  01/2017   from neck  . UMBILICAL HERNIA REPAIR  age 25  . UPPER GI ENDOSCOPY       OB History    Gravida  3   Para  2   Term  2   Preterm      AB  1   Living  2     SAB      TAB  1   Ectopic      Multiple      Live Births               Home Medications    Prior to Admission medications   Medication Sig Start Date End Date Taking? Authorizing Provider  albuterol (PROVENTIL HFA;VENTOLIN HFA) 108 (90 Base) MCG/ACT inhaler Inhale 1-2 puffs into the lungs every 6 (six) hours as needed for wheezing or shortness of breath.    [provider]  atorvastatin (LIPITOR) 20 MG tablet Take 20 mg by mouth daily. 05/01/18   [provider]  budesonide-formoterol (SYMBICORT) 80-4.5 MCG/ACT inhaler Inhale 2 puffs into the lungs 2 (two) times daily. 04/24/18   Parrett, Fonnie Mu, NP  cyclobenzaprine (FLEXERIL) 10 MG tablet Take 10 mg by mouth at bedtime as needed for muscle spasms.    [provider]  dexlansoprazole (DEXILANT) 60 MG capsule Take 1 capsule (60 mg total) by mouth daily. 06/25/18   Rigoberto Noel, MD    fluticasone (FLONASE) 50 MCG/ACT nasal spray Place 1 spray into both nostrils daily as needed for allergies. 03/18/18   [provider]  metoprolol succinate (TOPROL XL) 25 MG 24 hr tablet Take 0.5 tablets (12.5 mg total) by mouth daily. 07/08/18   Lendon Colonel, NP  pantoprazole (PROTONIX) 40 MG tablet Take 40 mg by mouth 2 (two) times daily before a meal.    [provider]    Family History Family History  Problem Relation Age of Onset  . Colon cancer Maternal Grandfather   . Hypertension Maternal Grandfather   . Cancer Maternal Grandfather        lung  . Hyperlipidemia Mother   . Hypertension Mother   .  Cancer Maternal Aunt        lung  . Hypertension Sister   . Hypertension Maternal Grandmother   . Hypertension Brother     Social History Social History   Tobacco Use  . Smoking status: Former Smoker    Packs/day: 0.50    Years: 25.00    Pack years: 12.50    Types: Cigarettes    Last attempt to quit: 07/24/2008    Years since quitting: 9.9  . Smokeless tobacco: Never Used  Substance Use Topics  . Alcohol use: Yes    Alcohol/week: 0.0 standard drinks    Comment: 11/15/2016 "might have 1 drink/month; if that"  . Drug use: No     Allergies   Detrol [tolterodine]; Gabapentin; Aspirin; Contrast media [iodinated diagnostic agents]; Hydrocodone-acetaminophen; Imitrex [sumatriptan]; Oxycodone; and Avelox [moxifloxacin]   Review of Systems Review of Systems  Constitutional: Negative for fever and unexpected weight change.  Gastrointestinal: Positive for constipation. Negative for nausea and vomiting.       Negative for fecal incontinence.   Genitourinary: Negative for dysuria, flank pain, hematuria, pelvic pain, vaginal bleeding and vaginal discharge.       Negative for urinary incontinence or retention.  Musculoskeletal: Positive for back pain.  Neurological: Negative for weakness and numbness.       Denies saddle paresthesias.     Physical  Exam Updated Vital Signs BP (!) 142/74 (BP Location: Right Arm)   Pulse 73   Temp 98.5 F (36.9 C) (Oral)   Resp 19   SpO2 100%   Physical Exam  Constitutional: She appears well-developed and well-nourished.  Pt sitting in chair, it takes considerable effort for her to even lean forward due to pain in her lower back with movement.   HENT:  Head: Normocephalic and atraumatic.  Eyes: Conjunctivae are normal.  Neck: Normal range of motion. Neck supple.  Pulmonary/Chest: Effort normal.  Abdominal: Soft. There is no tenderness. There is no CVA tenderness.  Musculoskeletal: Normal range of motion.       Cervical back: She exhibits normal range of motion, no tenderness and no bony tenderness.       Thoracic back: She exhibits normal range of motion, no tenderness and no bony tenderness.       Lumbar back: She exhibits tenderness. She exhibits normal range of motion and no bony tenderness.       Back:  No step-off noted with palpation of spine.   Neurological: She is alert. She has normal strength and normal reflexes. No sensory deficit.  5/5 strength in entire lower extremities bilaterally. No sensation deficit.   Skin: Skin is warm and dry. No rash noted.  Psychiatric: She has a normal mood and affect.  Nursing note and vitals reviewed.    ED Treatments / Results  Labs (all labs ordered are listed, but only abnormal results are displayed) Labs Reviewed - No data to display  EKG None  Radiology Dg Lumbar Spine Complete  Result Date: 07/15/2018 CLINICAL DATA:  Lumbago EXAM: LUMBAR SPINE - COMPLETE 4+ VIEW COMPARISON:  Lumbar MRI May 19, 2017 FINDINGS: Frontal, lateral, spot lumbosacral lateral, bilateral oblique views were obtained. There are 5 non-rib-bearing lumbar type vertebral bodies. There is no fracture or spondylolisthesis. There is minimal disc space narrowing at L4-5. Other disc spaces appear unremarkable. There is no appreciable facet arthropathy. There is aortic  atherosclerosis. IMPRESSION: Minimal disc space narrowing at L4-5. Other disc spaces appear normal. No fracture or spondylolisthesis. There is aortic atherosclerosis. Aortic  Atherosclerosis (ICD10-I70.0). Electronically Signed   By: Lowella Grip III M.D.   On: 07/15/2018 14:38    Procedures Procedures (including critical care time)  Medications Ordered in ED Medications  ondansetron (ZOFRAN-ODT) disintegrating tablet 4 mg (4 mg Oral Given 07/15/18 1033)  methocarbamol (ROBAXIN) tablet 1,000 mg (1,000 mg Oral Given 07/15/18 1033)  HYDROmorphone (DILAUDID) injection 1 mg (1 mg Intramuscular Given 07/15/18 1033)  HYDROmorphone (DILAUDID) injection 1 mg (1 mg Intramuscular Given 07/15/18 1208)  oxyCODONE-acetaminophen (PERCOCET/ROXICET) 5-325 MG per tablet 2 tablet (2 tablets Oral Given 07/15/18 1335)  ondansetron (ZOFRAN-ODT) disintegrating tablet 4 mg (4 mg Oral Given 07/15/18 1336)  promethazine (PHENERGAN) tablet 12.5 mg (12.5 mg Oral Given 07/15/18 1558)  sodium chloride 0.9 % bolus 1,000 mL (0 mLs Intravenous Stopped 07/15/18 1923)  diazepam (VALIUM) tablet 5 mg (5 mg Oral Given 07/15/18 1953)  metoCLOPramide (REGLAN) injection 10 mg (10 mg Intravenous Given 07/15/18 1952)  diphenhydrAMINE (BENADRYL) injection 25 mg (25 mg Intravenous Given 07/15/18 1952)     Initial Impression / Assessment and Plan / ED Course  I have reviewed the triage vital signs and the nursing notes.  Pertinent labs & imaging results that were available during my care of the patient were reviewed by me and considered in my medical decision making (see chart for details).     Patient seen and examined. No red flags other than age, however patient has a significant history of similar pain. Medications ordered.   Vital signs reviewed and are as follows: BP (!) 142/74 (BP Location: Right Arm)   Pulse 73   Temp 98.5 F (36.9 C) (Oral)   Resp 19   SpO2 100%   1:11 PM Pt still with pain, she has been able to get  into bed. Slight improvement. PO percocet ordered.   Pt discussed with Dr. Regenia Skeeter who has seen. Pt able to get out of bed for him. Plan: plain film x-ray, reassess.   3:16 PM Pt seen. Still appears uncomfortable, c/o back pain. States she feels nausea, SOB due to pain. Will give phenergan, reassess.  4:50 PM Patient ambulated to bathroom with some assistance.  However she vomited afterwards. Will place IV, give fluid bolus, check basic labs. If improved nausea, and back pain controlled, feel she can go home.   Handoff to [facility] PA-C at shift change. Plan dispo when improved. If not better, consider imaging.   Final Clinical Impressions(s) / ED Diagnoses   Final diagnoses:  Acute bilateral low back pain with left-sided sciatica      ED Discharge Orders    None       Carlisle Cater, PA-C 07/16/18 1003    Sherwood Gambler, MD 07/16/18 2244

## 2018-07-15 NOTE — ED Notes (Signed)
Pt continues to have nausea and vomiting at this time.

## 2018-07-15 NOTE — Discharge Instructions (Addendum)
Take Zofran 20 minutes before taking tramadol for pain to reduce the likelihood of nausea or vomiting.  Follow-up with your spine doctor on Monday as scheduled.  Return to the ED if any concerning signs or symptoms develop such as bowel or bladder incontinence, fevers, worsening pain, or persistent vomiting.   Please read and follow all provided instructions.  Your diagnoses today include:  1. Acute bilateral low back pain with left-sided sciatica     Tests performed today include: Vital signs - see below for your results today  Medications prescribed:  Tramadol - narcotic-like pain medication  DO NOT drive or perform any activities that require you to be awake and alert because this medicine can make you drowsy.   Zofran (ondansetron) - for nausea and vomiting  Take any prescribed medications only as directed.  Home care instructions:  Follow any educational materials contained in this packet Please rest, use ice or heat on your back for the next several days Do not lift, push, pull anything more than 10 pounds for the next week  Follow-up instructions: Please follow-up with your primary care provider in the next 1 week for further evaluation of your symptoms.   Return instructions:  SEEK IMMEDIATE MEDICAL ATTENTION IF YOU HAVE: New numbness, tingling, weakness, or problem with the use of your arms or legs Severe back pain not relieved with medications Loss control of your bowels or bladder Increasing pain in any areas of the body (such as chest or abdominal pain) Shortness of breath, dizziness, or fainting.  Worsening nausea (feeling sick to your stomach), vomiting, fever, or sweats Any other emergent concerns regarding your health   Additional Information:  Your vital signs today were: BP (!) 144/71    Pulse (!) 55    Temp 98.5 F (36.9 C) (Oral)    Resp 16    SpO2 100%  If your blood pressure (BP) was elevated above 135/85 this visit, please have this repeated by your  doctor within one month. --------------

## 2018-07-16 ENCOUNTER — Other Ambulatory Visit: Payer: Self-pay

## 2018-07-16 ENCOUNTER — Ambulatory Visit (INDEPENDENT_AMBULATORY_CARE_PROVIDER_SITE_OTHER): Payer: PPO

## 2018-07-16 ENCOUNTER — Ambulatory Visit (HOSPITAL_COMMUNITY): Payer: PPO | Attending: Cardiology

## 2018-07-16 DIAGNOSIS — R000 Tachycardia, unspecified: Secondary | ICD-10-CM

## 2018-07-16 DIAGNOSIS — R0789 Other chest pain: Secondary | ICD-10-CM

## 2018-07-16 DIAGNOSIS — R002 Palpitations: Secondary | ICD-10-CM | POA: Insufficient documentation

## 2018-07-20 DIAGNOSIS — M545 Low back pain: Secondary | ICD-10-CM | POA: Diagnosis not present

## 2018-07-22 ENCOUNTER — Encounter: Payer: Self-pay | Admitting: *Deleted

## 2018-07-30 ENCOUNTER — Ambulatory Visit
Admission: RE | Admit: 2018-07-30 | Discharge: 2018-07-30 | Disposition: A | Discharge status: Home / Self Care | Payer: PPO | Source: Ambulatory Visit | Attending: Gastroenterology | Admitting: Gastroenterology

## 2018-07-30 DIAGNOSIS — K760 Fatty (change of) liver, not elsewhere classified: Secondary | ICD-10-CM | POA: Diagnosis not present

## 2018-07-30 DIAGNOSIS — R1011 Right upper quadrant pain: Secondary | ICD-10-CM

## 2018-07-31 ENCOUNTER — Ambulatory Visit
Admission: RE | Admit: 2018-07-31 | Discharge: 2018-07-31 | Disposition: A | Discharge status: Home / Self Care | Payer: PPO | Source: Ambulatory Visit | Attending: Family Medicine | Admitting: Family Medicine

## 2018-07-31 DIAGNOSIS — Z780 Asymptomatic menopausal state: Secondary | ICD-10-CM | POA: Diagnosis not present

## 2018-07-31 DIAGNOSIS — E2839 Other primary ovarian failure: Secondary | ICD-10-CM

## 2018-07-31 DIAGNOSIS — Z13820 Encounter for screening for osteoporosis: Secondary | ICD-10-CM | POA: Diagnosis not present

## 2018-08-04 ENCOUNTER — Encounter (INDEPENDENT_AMBULATORY_CARE_PROVIDER_SITE_OTHER): Payer: PPO

## 2018-08-11 ENCOUNTER — Ambulatory Visit (INDEPENDENT_AMBULATORY_CARE_PROVIDER_SITE_OTHER): Payer: PPO | Admitting: Bariatrics

## 2018-08-11 ENCOUNTER — Encounter (INDEPENDENT_AMBULATORY_CARE_PROVIDER_SITE_OTHER): Payer: Self-pay | Admitting: Bariatrics

## 2018-08-11 VITALS — BP 157/82 | HR 64 | Temp 97.9°F | Ht 64.0 in | Wt 255.0 lb

## 2018-08-11 DIAGNOSIS — Z6841 Body Mass Index (BMI) 40.0 and over, adult: Secondary | ICD-10-CM | POA: Diagnosis not present

## 2018-08-11 DIAGNOSIS — R5383 Other fatigue: Secondary | ICD-10-CM | POA: Diagnosis not present

## 2018-08-11 DIAGNOSIS — F3289 Other specified depressive episodes: Secondary | ICD-10-CM | POA: Diagnosis not present

## 2018-08-11 DIAGNOSIS — G4733 Obstructive sleep apnea (adult) (pediatric): Secondary | ICD-10-CM

## 2018-08-11 DIAGNOSIS — Z0289 Encounter for other administrative examinations: Secondary | ICD-10-CM

## 2018-08-11 DIAGNOSIS — R0602 Shortness of breath: Secondary | ICD-10-CM

## 2018-08-11 DIAGNOSIS — Z9189 Other specified personal risk factors, not elsewhere classified: Secondary | ICD-10-CM

## 2018-08-12 ENCOUNTER — Encounter: Payer: Self-pay | Admitting: Cardiology

## 2018-08-12 DIAGNOSIS — R5383 Other fatigue: Secondary | ICD-10-CM | POA: Diagnosis not present

## 2018-08-12 NOTE — Progress Notes (Signed)
Cardiology Office Note   Date:  08/13/2018   ID:  Paula [name], DOB 1960-12-10, MRN [id number]  PCP:  Carol Ada, MD  Cardiologist:   Minus Breeding, MD    Chief Complaint  Patient presents with  . Chest Pain      History of Present Illness: Paula [name] is a 57 y.o. female who presents for evaluation of palpitations.  She has had these in the past and had a Myoview in 2017 with normal EF and no ischemia.  In May she was in the ED with chest pain.  CT was negative for evidence of a PE.  She was seen in our office last month and had SOB.  Echo demonstrated mild LVH but was otherwise unremarkable.    Today she came back to review the event monitor.  I reviewed this with her and there were absolutely no significant arrhythmias though she had some symptoms of pain and fatigue nothing correlated with a dysrhythmia.  She had an echocardiogram as above.  She does not have the other studies.  She actually denies any new cardiovascular symptoms.  She is going to start diet soon.  Past Medical History:  Diagnosis Date  . Anxiety   . Chronic lower back pain   . Depression   . Diverticulitis   . Expressive aphasia 11/15/2016  . Fibromyalgia   . GERD (gastroesophageal reflux disease)   . High cholesterol   . History of gastric ulcer   . History of hiatal hernia   . History of kidney stones   . History of thyroid nodule   . Hyperlipidemia   . Hypertension   . IBS (irritable bowel syndrome)   . Intermittent vertigo   . Migraine    "a few times/year now; maybe" (11/15/2016)  . Obesity   . OSA on CPAP    wears CPAP nightly ;study in epic 02-01-2014 (11/15/2016)  . Osteoarthritis    "knees, hands, back, hips" (11/15/2016)  . Pneumonia X 1   hx of  . Psychogenic tremor    intermittant head tremor-(11/15/2016)  . Refusal of blood transfusions as patient is Jehovah's Witness   . RLS (restless legs syndrome)   . Sleep apnea   . Urinary incontinence     Past Surgical  History:  Procedure Laterality Date  . ABDOMINAL HYSTERECTOMY  1990  . ANTERIOR CERVICAL DECOMP/DISCECTOMY FUSION  02-10-2004   C4 -- C7  . BACK SURGERY    . CARDIOVASCULAR STRESS TEST  2017   Negative  . CESAREAN SECTION  1977; 1982  . COLONOSCOPY    . CYSTOSCOPY W/ RETROGRADES Right 06/07/2015   Procedure: CYSTOSCOPY WITH RETROGRADE PYELOGRAM;  Surgeon: Ardis Hughs, MD;  Location[facility];  Service: Urology;  Laterality: Right;  . CYSTOSCOPY WITH URETEROSCOPY AND STENT PLACEMENT Right 06/07/2015   Procedure: RIGHT URETEROSCOPY, LASER LITHOTRIPSY, STONE REMOVAL AND RIGHT URETERAL STENT PLACEMENT;  Surgeon: Ardis Hughs, MD;  Location: [facility];  Service: Urology;  Laterality: Right;  . HERNIA REPAIR    . HOLMIUM LASER APPLICATION N/A 1/0/9323   Procedure: HOLMIUM LASER APPLICATION;  Surgeon: Ardis Hughs, MD;  Location: [facility];  Service: Urology;  Laterality: N/A;  . LAPAROSCOPIC CHOLECYSTECTOMY  10-07-2000  . LIPOMA EXCISION  01/2017   from neck  . UMBILICAL HERNIA REPAIR  age 18  . UPPER GI ENDOSCOPY       Current Outpatient Medications  Medication Sig Dispense Refill  .  atorvastatin (LIPITOR) 20 MG tablet Take 20 mg by mouth daily.    . cyclobenzaprine (FLEXERIL) 10 MG tablet Take 10 mg by mouth at bedtime as needed for muscle spasms.    [name] dicyclomine (BENTYL) 10 MG capsule Take 20 mg by mouth 3 (three) times daily as needed for cramping.  1  . pantoprazole (PROTONIX) 40 MG tablet Take 40 mg by mouth 2 (two) times daily before a meal.    . sertraline (ZOLOFT) 50 MG tablet Take 50 mg by mouth daily.    . traMADol (ULTRAM) 50 MG tablet Take 1 tablet (50 mg total) by mouth every 6 (six) hours as needed. 10 tablet 0   No current facility-administered medications for this visit.     Allergies:   Detrol [tolterodine]; Gabapentin; Aspirin; Contrast media [iodinated diagnostic agents]; Hydrocodone-acetaminophen;  Imitrex [sumatriptan]; Oxycodone; and Avelox [moxifloxacin]    ROS:  Please see the history of present illness.   Otherwise, review of systems are positive for none.   All other systems are reviewed and negative.    PHYSICAL EXAM: VS:  BP 132/88 (BP Location: Left Arm, Patient Position: Sitting, Cuff Size: Large)   Pulse 74   Ht 5\' 4"  (1.626 m)   Wt 256 lb 12.8 oz (116.5 kg)   BMI 44.08 kg/m  , BMI Body mass index is 44.08 kg/m. GENERAL:  Well appearing NECK:  No jugular venous distention, waveform within normal limits, carotid upstroke brisk and symmetric, no bruits, no thyromegaly LUNGS:  Clear to auscultation bilaterally BACK:  No CVA tenderness CHEST:  Unremarkable HEART:  PMI not displaced or sustained,S1 and S2 within normal limits, no S3, no S4, no clicks, no rubs, no murmurs ABD:  Flat, positive bowel sounds normal in frequency in pitch, no bruits, no rebound, no guarding, no midline pulsatile mass, no hepatomegaly, no splenomegaly EXT:  2 plus pulses throughout, no edema, no cyanosis no clubbing    EKG:  EKG is not ordered today.    Recent Labs: 07/15/2018: Hemoglobin 12.5; Platelets 375 08/12/2018: ALT 35; BUN 13; Creatinine, Ser 0.78; Potassium 4.7; Sodium 142; TSH 1.690    Lipid Panel    Component Value Date/Time   CHOL 282 (H) 11/16/2016 0039   TRIG 351 (H) 11/16/2016 0039   HDL 39 (L) 11/16/2016 0039   CHOLHDL 7.2 11/16/2016 0039   VLDL 70 (H) 11/16/2016 0039   LDLCALC 173 (H) 11/16/2016 0039   LDLDIRECT 170.1 01/08/2010 0943      Wt Readings from Last 3 Encounters:  08/13/18 256 lb 12.8 oz (116.5 kg)  08/11/18 255 lb (115.7 kg)  07/08/18 260 lb (117.9 kg)      Other studies Reviewed: Additional studies/ records that were reviewed today include: None Review of the above records demonstrates:     ASSESSMENT AND PLAN:  Palpitations:   She wore an event monitor last month and there were no significant arrhythmias.   No further work-up is  planned.  Dyspnea:   She has had negative work ups in the past.  She is had an extensive work-up.  No change in therapy.  I think this may be related to weight and deconditioning.  HTN:     The blood pressure is at target. No change in medications is indicated. We will continue with therapeutic lifestyle changes (TLC).   Current medicines are reviewed at length with the patient today.  The patient does not have concerns regarding medicines.  The following changes have been made:  None  Labs/ tests ordered today include:   None No orders of the defined types were placed in this encounter.    Disposition:   FU with me as needed  .     Signed, Minus Breeding, MD  08/13/2018 10:45 AM    [place]

## 2018-08-13 ENCOUNTER — Ambulatory Visit: Payer: PPO | Admitting: Cardiology

## 2018-08-13 ENCOUNTER — Encounter: Payer: Self-pay | Admitting: Cardiology

## 2018-08-13 VITALS — BP 132/88 | HR 74 | Ht 64.0 in | Wt 256.8 lb

## 2018-08-13 DIAGNOSIS — R0789 Other chest pain: Secondary | ICD-10-CM | POA: Insufficient documentation

## 2018-08-13 NOTE — Patient Instructions (Signed)
Medication Instructions:  Your physician recommends that you continue on your current medications as directed. Please refer to the Current Medication list given to you today.  Labwork: None ordered.  Testing/Procedures: None ordered.  Follow-Up: Your physician recommends that you schedule a follow-up appointment as needed.   Any Other Special Instructions Will Be Listed Below (If Applicable).     If you need a refill on your cardiac medications before your next appointment, please call your pharmacy.   

## 2018-08-13 NOTE — Progress Notes (Signed)
.  Office: [phone]  /  Fax: [phone]   HPI:   Chief Complaint: Paula [name] (MR# [id number]) is a 57 y.o. female who presents on 08/11/2018 for obesity evaluation and treatment. Current BMI is Body mass index is 43.77 kg/m.[name] Paula [name] has struggled with obesity for years and has been unsuccessful in either losing weight or maintaining long term weight loss. Paula [name] attended our information session and states she is currently in the action stage of change and ready to dedicate time achieving and maintaining a healthier weight.   Paula [name] notes increased hunger and no specific cravings. She notes "possible binging" and polyphagia. She also notes polyuria and polydipsia.  Paula [name] states her family eats meals together she thinks her family will eat healthier with  her her desired weight loss is 65 lbs she started gaining weight from being sick and put on steroids  her heaviest weight ever was 262 lbs she is a picky eater and doesn't like to eat healthier foods  she has significant food cravings issues  she snacks frequently in the evenings she skips meals frequently she is frequently drinking liquids with calories she frequently makes poor food choices she has problems with excessive hunger  she frequently eats larger portions than normal  she struggles with emotional eating    Fatigue Paula [name] feels her energy is lower than it should be. This has worsened with weight gain and has not worsened recently. Paula [name] admits to daytime somnolence and  admits to waking up still tired. Patient has a history of obstructive sleep apnea with the use of CPAP. Patent has a history of symptoms of daytime fatigue, morning headache, and dizziness. Patient generally gets 6 hours of sleep per night, and states they generally have difficulty falling back asleep if awakened. Snoring is present. Apneic episodes are present. Epworth Sleepiness Score is 4.  Dyspnea on exertion Paula [name] notes increasing  shortness of breath with exercising and seems to be worsening over time with weight gain. She notes getting out of breath sooner with activity than she used to. She notes shortness of breath is more prolonged with fast walking or climbing stairs. This has not gotten worse recently. Paula [name] denies orthopnea.  Obstructive Sleep Apnea Paula [name] has obstructive sleep apnea. She is wearing her CPAP every night and has ongoing follow ups. She denies issues with her CPAP at this time.  Depression with emotional eating behaviors Paula [name] is struggling with emotional eating and using food for comfort to the extent that it is negatively impacting her health. She often snacks when she is not hungry. Paula [name] sometimes feels she is out of control and then feels guilty that she made poor food choices. She has been working on behavior modification techniques to help reduce her emotional eating and has been somewhat successful. She shows no sign of suicidal or homicidal ideations.  Depression screen [facility] 2/9 08/11/2018 03/26/2018 03/17/2018  Decreased Interest 2 1 1   Down, Depressed, Hopeless 3 1 1   PHQ - 2 Score 5 2 2   Altered sleeping 2 2 3   Tired, decreased energy 3 2 3   Change in appetite 3 1 1   Feeling bad or failure about yourself  3 1 1   Trouble concentrating 2 1 1   Moving slowly or fidgety/restless 1 1 1   Suicidal thoughts 1 0 1  PHQ-9 Score 20 10 13   Difficult doing work/chores Very difficult Very difficult Very difficult  Some recent data might be hidden   Depression Screen Paula [name]'s Food and Mood (  modified PHQ-9) score was  Depression screen PHQ 2/9 08/11/2018  Decreased Interest 2  Down, Depressed, Hopeless 3  PHQ - 2 Score 5  Altered sleeping 2  Tired, decreased energy 3  Change in appetite 3  Feeling bad or failure about yourself  3  Trouble concentrating 2  Moving slowly or fidgety/restless 1  Suicidal thoughts 1  PHQ-9 Score 20  Difficult doing work/chores Very difficult  Some recent data  might be hidden   At risk for diabetes Paula [name] is at higher than average risk for developing diabetes due to her obesity. She admits to polyuria or polydipsia.  ALLERGIES: Allergies  Allergen Reactions  . Detrol [Tolterodine] Other (See Comments)    slurred speech, tremors, dizziness  . Gabapentin Palpitations and Other (See Comments)    Wt gain, tremor  . Aspirin Other (See Comments)    Avoids--- history of gastric ulcers   . Contrast Media [Iodinated Diagnostic Agents] Hives, Itching and Rash    CT contrast-Needed to take benadryl   . Hydrocodone-Acetaminophen Nausea And Vomiting  . Imitrex [Sumatriptan] Other (See Comments)    Body hurts  . Oxycodone Nausea And Vomiting  . Avelox [Moxifloxacin] Itching    MEDICATIONS: Current Outpatient Medications on File Prior to Visit  Medication Sig Dispense Refill  . atorvastatin (LIPITOR) 20 MG tablet Take 20 mg by mouth daily.    . cyclobenzaprine (FLEXERIL) 10 MG tablet Take 10 mg by mouth at bedtime as needed for muscle spasms.    [name] dicyclomine (BENTYL) 10 MG capsule Take 20 mg by mouth 3 (three) times daily as needed for cramping.  1  . pantoprazole (PROTONIX) 40 MG tablet Take 40 mg by mouth 2 (two) times daily before a meal.    . sertraline (ZOLOFT) 50 MG tablet Take 50 mg by mouth daily.    . traMADol (ULTRAM) 50 MG tablet Take 1 tablet (50 mg total) by mouth every 6 (six) hours as needed. 10 tablet 0   No current facility-administered medications on file prior to visit.     PAST MEDICAL HISTORY: Past Medical History:  Diagnosis Date  . Anxiety   . Chronic lower back pain   . Depression   . Diverticulitis   . Expressive aphasia 11/15/2016  . Fibromyalgia   . GERD (gastroesophageal reflux disease)   . High cholesterol   . History of gastric ulcer   . History of hiatal hernia   . History of kidney stones   . History of thyroid nodule   . Hyperlipidemia   . Hypertension   . IBS (irritable bowel syndrome)   .  Intermittent vertigo   . Migraine    "a few times/year now; maybe" (11/15/2016)  . Obesity   . OSA on CPAP    wears CPAP nightly ;study in epic 02-01-2014 (11/15/2016)  . Osteoarthritis    "knees, hands, back, hips" (11/15/2016)  . Pneumonia X 1   hx of  . Psychogenic tremor    intermittant head tremor-(11/15/2016)  . Refusal of blood transfusions as patient is Jehovah's Witness   . RLS (restless legs syndrome)   . Sleep apnea   . Urinary incontinence     PAST SURGICAL HISTORY: Past Surgical History:  Procedure Laterality Date  . ABDOMINAL HYSTERECTOMY  1990  . ANTERIOR CERVICAL DECOMP/DISCECTOMY FUSION  02-10-2004   C4 -- C7  . BACK SURGERY    . CARDIOVASCULAR STRESS TEST  2017   Negative  . CESAREAN SECTION  1977; 1982  .  COLONOSCOPY    . CYSTOSCOPY W/ RETROGRADES Right 06/07/2015   Procedure: CYSTOSCOPY WITH RETROGRADE PYELOGRAM;  Surgeon: Ardis Hughs, MD;  Location: [facility];  Service: Urology;  Laterality: Right;  . CYSTOSCOPY WITH URETEROSCOPY AND STENT PLACEMENT Right 06/07/2015   Procedure: RIGHT URETEROSCOPY, LASER LITHOTRIPSY, STONE REMOVAL AND RIGHT URETERAL STENT PLACEMENT;  Surgeon: Ardis Hughs, MD;  Location: [facility];  Service: Urology;  Laterality: Right;  . HERNIA REPAIR    . HOLMIUM LASER APPLICATION N/A 03/08/8294   Procedure: HOLMIUM LASER APPLICATION;  Surgeon: Ardis Hughs, MD;  Location: [facility];  Service: Urology;  Laterality: N/A;  . LAPAROSCOPIC CHOLECYSTECTOMY  10-07-2000  . LIPOMA EXCISION  01/2017   from neck  . UMBILICAL HERNIA REPAIR  age 63  . UPPER GI ENDOSCOPY      SOCIAL HISTORY: Social History   Tobacco Use  . Smoking status: Former Smoker    Packs/day: 0.50    Years: 25.00    Pack years: 12.50    Types: Cigarettes    Last attempt to quit: 07/24/2008    Years since quitting: 10.0  . Smokeless tobacco: Never Used  Substance Use Topics  . Alcohol use: Yes     Alcohol/week: 0.0 standard drinks    Comment: 11/15/2016 "might have 1 drink/month; if that"  . Drug use: No    FAMILY HISTORY: Family History  Problem Relation Age of Onset  . Colon cancer Maternal Grandfather   . Hypertension Maternal Grandfather   . Lung cancer Maternal Grandfather   . Throat cancer Maternal Grandfather   . Hyperlipidemia Mother   . Hypertension Mother   . Sleep apnea Mother   . Hypertension Father   . Heart disease Father   . Lung cancer Maternal Aunt   . Hypertension Sister   . Hypertension Maternal Grandmother   . CVA Maternal Grandmother   . Hypertension Brother     ROS: Review of Systems  Constitutional: Positive for malaise/fatigue. Negative for weight loss.       + Trouble sleeping  HENT:       + Hoarseness  Eyes: Positive for blurred vision and double vision.       + Wear glasses or contacts  Respiratory: Positive for shortness of breath (with exertion).   Cardiovascular: Negative for orthopnea.       + Chest pain/discomfort + Calf/leg pain with walking + Very cold feet or hands  Gastrointestinal: Positive for diarrhea, heartburn and nausea.       + Swallowing difficulty  Genitourinary: Positive for frequency.  Musculoskeletal: Positive for back pain.  Skin:       + Dryness + Hair or nail changes  Neurological: Positive for dizziness, tremors, weakness and headaches.  Endo/Heme/Allergies: Positive for polydipsia.       Positive polyphagia  Psychiatric/Behavioral: Positive for depression. Negative for suicidal ideas.       + Stress    PHYSICAL EXAM: Blood pressure (!) 157/82, pulse 64, temperature 97.9 F (36.6 C), temperature source Oral, height 5\' 4"  (1.626 m), weight 255 lb (115.7 kg), SpO2 98 %. Body mass index is 43.77 kg/m. Physical Exam  Constitutional: She is oriented to person, place, and time. She appears well-developed and well-nourished.  HENT:  Head: Normocephalic and atraumatic.  Nose: Nose normal.  Mallampati +4    Eyes: EOM are normal. No scleral icterus.  Neck: Normal range of motion. Neck supple. No thyromegaly present.  Cardiovascular: Normal rate and  regular rhythm.  Pulmonary/Chest: Effort normal. No respiratory distress.  Abdominal: Soft. There is no tenderness.  + Obesity  Musculoskeletal:  Range of Motion normal in all 4 extremities Trace edema noted in bilateral lower extremities  Neurological: She is alert and oriented to person, place, and time. Coordination normal.  Skin: Skin is warm and dry.  Psychiatric: She has a normal mood and affect. Her behavior is normal.  Vitals reviewed.   RECENT LABS AND TESTS: BMET    Component Value Date/Time   NA 142 08/12/2018 1143   K 4.7 08/12/2018 1143   CL 102 08/12/2018 1143   CO2 17 (L) 08/12/2018 1143   GLUCOSE 91 08/12/2018 1143   GLUCOSE 103 (H) 07/15/2018 1945   BUN 13 08/12/2018 1143   CREATININE 0.78 08/12/2018 1143   CALCIUM 10.4 (H) 08/12/2018 1143   GFRNONAA 84 08/12/2018 1143   GFRAA 97 08/12/2018 1143   Lab Results  Component Value Date   HGBA1C 5.8 (H) 08/12/2018   Lab Results  Component Value Date   INSULIN WILL FOLLOW 08/12/2018   CBC    Component Value Date/Time   WBC 9.0 07/15/2018 1945   RBC 4.22 07/15/2018 1945   HGB 12.5 07/15/2018 1945   HCT 38.0 07/15/2018 1945   PLT 375 07/15/2018 1945   MCV 90.0 07/15/2018 1945   MCH 29.6 07/15/2018 1945   MCHC 32.9 07/15/2018 1945   RDW 12.3 07/15/2018 1945   LYMPHSABS 2.5 07/15/2018 1945   MONOABS 0.3 07/15/2018 1945   EOSABS 0.1 07/15/2018 1945   BASOSABS 0.0 07/15/2018 1945   Iron/TIBC/Ferritin/ %Sat No results found for: IRON, TIBC, FERRITIN, IRONPCTSAT Lipid Panel     Component Value Date/Time   CHOL 282 (H) 11/16/2016 0039   TRIG 351 (H) 11/16/2016 0039   HDL 39 (L) 11/16/2016 0039   CHOLHDL 7.2 11/16/2016 0039   VLDL 70 (H) 11/16/2016 0039   LDLCALC 173 (H) 11/16/2016 0039   LDLDIRECT 170.1 01/08/2010 0943   Hepatic Function Panel      Component Value Date/Time   PROT 7.4 08/12/2018 1143   ALBUMIN 4.9 08/12/2018 1143   AST 32 08/12/2018 1143   ALT 35 (H) 08/12/2018 1143   ALKPHOS 109 08/12/2018 1143   BILITOT 0.4 08/12/2018 1143   BILIDIR 0.1 10/09/2012 1653      Component Value Date/Time   TSH 1.690 08/12/2018 1143   Vitamin D No recent labs  ECG  shows NSR with a rate of 69 BPM INDIRECT CALORIMETER done today shows a VO2 of 281 and a REE of 1954. Her calculated basal metabolic rate is 3299 thus her basal metabolic rate is better than expected.    ASSESSMENT AND PLAN: Other fatigue - Plan: Comprehensive metabolic panel, Hemoglobin A1c, Insulin, random, VITAMIN D 25 Hydroxy (Vit-D Deficiency, Fractures), T4, free, T3, TSH  Shortness of breath on exertion  OSA (obstructive sleep apnea)  Other depression - with emotional eating  At risk for diabetes mellitus  Class 3 severe obesity with serious comorbidity and body mass index (BMI) of 40.0 to 44.9 in adult, unspecified obesity type (HCC)  PLAN:  Fatigue Aly was informed that her fatigue may be related to obesity, depression or many other causes. Labs will be ordered, and in the meanwhile Tawnia has agreed to work on diet, exercise and weight loss to help with fatigue. Proper sleep hygiene was discussed including the need for 7-8 hours of quality sleep each night. A sleep study was not ordered based on symptoms  and Epworth score.  Dyspnea on exertion Paula [name]'s shortness of breath appears to be obesity related and exercise induced. IC was done today, we discussed actual and expected BMR/RMR. She will began walking or other exercises in the future. She has agreed to work on weight loss and gradually increase exercise to treat her exercise induced shortness of breath. If Paula [name] follows our instructions and loses weight without improvement of her shortness of breath, we will plan to refer to pulmonology. We will monitor this condition regularly. Paula [name] agrees  to this plan.   Obstructive Sleep Apnea Paula [name] will continue to wear her CPAP nightly. We discussed the benefits on overall health and weight. Paula [name] agrees to follow up with our clinic in 2 weeks.  Depression with Emotional Eating Behaviors We discussed behavior modification techniques today to help Paula [name] deal with her emotional eating and depression. We will refer to Paula [name]. Mallie [name], our bariatric psychologist. Paula [name] is not taking her Zoloft at this time, her primary care physician is aware. She may consider other medications in the future. Paula [name] agrees to follow up with our clinic in 2 weeks.  Depression Screen Paula [name] had a strongly positive depression screening. Depression is commonly associated with obesity and often results in emotional eating behaviors. We will monitor this closely and work on CBT to help improve the non-hunger eating patterns. Referral to Psychology may be required if no improvement is seen as she continues in our clinic.  Diabetes risk counselling Paula [name] was given extended (15 minutes) diabetes prevention counseling today. She is 57 y.o. female and has risk factors for diabetes including obesity. We discussed intensive lifestyle modifications today with an emphasis on weight loss as well as increasing exercise and decreasing simple carbohydrates in her diet.  Obesity Paula [name] is currently in the action stage of change and her goal is to continue with weight loss efforts She has agreed to follow the Category 3 plan Carling has been instructed to work up to a goal of 150 minutes of combined cardio and strengthening exercise per week for weight loss and overall health benefits. We discussed the following Behavioral Modification Strategies today: increasing lean protein intake and no skipping meals Valor is to not skip meal, decrease refined carbohydrates, increase water, and increase meal preparation and planning.  Gladis has agreed to follow up with our clinic in 2 weeks. She  was informed of the importance of frequent follow up visits to maximize her success with intensive lifestyle modifications for her multiple health conditions. She was informed we would discuss her lab results at her next visit unless there is a critical issue that needs to be addressed sooner. Tracye agreed to keep her next visit at the agreed upon time to discuss these results.    OBESITY BEHAVIORAL INTERVENTION VISIT  Today's visit was # 1   Starting weight: 255 lbs Starting date: 08/11/18 Today's weight : 255 lbs  Today's date: 08/11/2018 Total lbs lost to date: 0    ASK: We discussed the diagnosis of obesity with Melchor Amour today and Justice Rocher agreed to give [place] permission to discuss obesity behavioral modification therapy today.  ASSESS: Kc has the diagnosis of obesity and her BMI today is 43.75 Cayle is in the action stage of change   ADVISE: Luba was educated on the multiple health risks of obesity as well as the benefit of weight loss to improve her health. She was advised of the need for long term treatment and the importance of lifestyle modifications to improve her  current health and to decrease her risk of future health problems.  AGREE: Multiple dietary modification options and treatment options were discussed and  Lynden agreed to follow the recommendations documented in the above note.  ARRANGE: Shaneisha was educated on the importance of frequent visits to treat obesity as outlined per CMS and USPSTF guidelines and agreed to schedule her next follow up appointment today.   Wilhemena Durie, am acting as transcriptionist for [employer], DO  I have reviewed the above documentation for accuracy and completeness, and I agree with the above. -Jearld Lesch, DO

## 2018-08-14 LAB — COMPREHENSIVE METABOLIC PANEL
A/G RATIO: 2 (ref 1.2–2.2)
ALT: 35 IU/L — AB (ref 0–32)
AST: 32 IU/L (ref 0–40)
Albumin: 4.9 g/dL (ref 3.5–5.5)
Alkaline Phosphatase: 109 IU/L (ref 39–117)
BUN/Creatinine Ratio: 17 (ref 9–23)
BUN: 13 mg/dL (ref 6–24)
Bilirubin Total: 0.4 mg/dL (ref 0.0–1.2)
CALCIUM: 10.4 mg/dL — AB (ref 8.7–10.2)
CO2: 17 mmol/L — ABNORMAL LOW (ref 20–29)
CREATININE: 0.78 mg/dL (ref 0.57–1.00)
Chloride: 102 mmol/L (ref 96–106)
GFR, EST AFRICAN AMERICAN: 97 mL/min/{1.73_m2} (ref >59)
GFR, EST NON AFRICAN AMERICAN: 84 mL/min/{1.73_m2} (ref >59)
GLUCOSE: 91 mg/dL (ref 65–99)
Globulin, Total: 2.5 g/dL (ref 1.5–4.5)
POTASSIUM: 4.7 mmol/L (ref 3.5–5.2)
Sodium: 142 mmol/L (ref 134–144)
TOTAL PROTEIN: 7.4 g/dL (ref 6.0–8.5)

## 2018-08-14 LAB — HEMOGLOBIN A1C
ESTIMATED AVERAGE GLUCOSE: 120 mg/dL
Hgb A1c MFr Bld: 5.8 % — ABNORMAL HIGH (ref 4.8–5.6)

## 2018-08-14 LAB — VITAMIN D 25 HYDROXY (VIT D DEFICIENCY, FRACTURES): Vit D, 25-Hydroxy: 17.8 ng/mL — ABNORMAL LOW (ref 30.0–100.0)

## 2018-08-14 LAB — T3: T3 TOTAL: 114 ng/dL (ref 71–180)

## 2018-08-14 LAB — INSULIN, RANDOM: INSULIN: 12.3 u[IU]/mL (ref 2.6–24.9)

## 2018-08-14 LAB — TSH: TSH: 1.69 u[IU]/mL (ref 0.450–4.500)

## 2018-08-14 LAB — T4, FREE: Free T4: 0.94 ng/dL (ref 0.82–1.77)

## 2018-08-17 ENCOUNTER — Encounter (INDEPENDENT_AMBULATORY_CARE_PROVIDER_SITE_OTHER): Payer: Self-pay | Admitting: Bariatrics

## 2018-08-17 DIAGNOSIS — M545 Low back pain: Secondary | ICD-10-CM | POA: Diagnosis not present

## 2018-08-17 DIAGNOSIS — Z79899 Other long term (current) drug therapy: Secondary | ICD-10-CM | POA: Diagnosis not present

## 2018-08-17 DIAGNOSIS — G8929 Other chronic pain: Secondary | ICD-10-CM | POA: Diagnosis not present

## 2018-08-17 DIAGNOSIS — G894 Chronic pain syndrome: Secondary | ICD-10-CM | POA: Diagnosis not present

## 2018-08-25 ENCOUNTER — Encounter (INDEPENDENT_AMBULATORY_CARE_PROVIDER_SITE_OTHER): Payer: Self-pay | Admitting: Bariatrics

## 2018-08-25 ENCOUNTER — Ambulatory Visit (INDEPENDENT_AMBULATORY_CARE_PROVIDER_SITE_OTHER): Payer: PPO | Admitting: Bariatrics

## 2018-08-25 ENCOUNTER — Ambulatory Visit (INDEPENDENT_AMBULATORY_CARE_PROVIDER_SITE_OTHER): Payer: PPO | Admitting: Psychology

## 2018-08-25 DIAGNOSIS — F331 Major depressive disorder, recurrent, moderate: Secondary | ICD-10-CM | POA: Diagnosis not present

## 2018-08-25 DIAGNOSIS — Z9189 Other specified personal risk factors, not elsewhere classified: Secondary | ICD-10-CM

## 2018-08-25 DIAGNOSIS — R7303 Prediabetes: Secondary | ICD-10-CM

## 2018-08-25 DIAGNOSIS — Z6841 Body Mass Index (BMI) 40.0 and over, adult: Secondary | ICD-10-CM

## 2018-08-25 DIAGNOSIS — E559 Vitamin D deficiency, unspecified: Secondary | ICD-10-CM

## 2018-08-25 MED ORDER — VITAMIN D (ERGOCALCIFEROL) 1.25 MG (50000 UNIT) PO CAPS: 50000.0000 [IU] | ORAL_CAPSULE | ORAL | 0 refills | Status: DC
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
## 2018-08-25 NOTE — Addendum Note (Signed)
Addended by: Nash Dimmer on: 08/25/2018 04:41 PM   Modules accepted: Orders

## 2018-08-26 NOTE — Progress Notes (Signed)
Office: [phone]  /  Fax: [phone]   HPI:   Chief Complaint: OBESITY Paula [name] is here to discuss her progress with her obesity treatment plan. She is on the  follow the Category 3 plan and is following her eating plan approximately 60 % of the time. She states she is exercising 0 minutes 0 times per week. Paula [name] is currently struggling with getting all of the food in on the Category 3 plan. She is eating an increased amount of white sugar.   Her weight is 250 lb (113.4 kg) today and has had a weight loss of 5 pounds over a period of 2 weeks since her last visit. She has lost 5 lbs since starting treatment with [place].  Hypercalcemia  Paula [name] has an elevated serum calcium level of 10.4 and a decreased vitamin D level. Denies fatigue but reports occasional muscle aches.   Vitamin D deficiency Paula [name] has a diagnosis of vitamin D deficiency. She is currently taking vit D and denies nausea, vomiting or muscle weakness. She admits her fatigue has decreased.    Ref. Range 08/12/2018 11:43  Vitamin D, 25-Hydroxy Latest Ref Range: 30.0 - 100.0 ng/mL 17.8 (L)   Pre-Diabetes Paula [name] has a diagnosis of prediabetes based on her elevated HgA1c of 5.8 and insulin >10 both were done on 08/12/18, and was informed this puts her at greater risk of developing diabetes. She is not taking metformin currently and continues to work on diet and exercise to decrease risk of diabetes. She denies nausea, polyuria or hypoglycemia. Yet she does report an increase in polyphagia.   At risk for diabetes Paula [name] is at higher than averagerisk for developing diabetes due to her obesity. She currently denies polyuria or polydipsia.   ALLERGIES: Allergies  Allergen Reactions  . Detrol [Tolterodine] Other (See Comments)    slurred speech, tremors, dizziness  . Gabapentin Palpitations and Other (See Comments)    Wt gain, tremor  . Aspirin Other (See Comments)    Avoids--- history of gastric ulcers   . Contrast Media  [Iodinated Diagnostic Agents] Hives, Itching and Rash    CT contrast-Needed to take benadryl   . Hydrocodone-Acetaminophen Nausea And Vomiting  . Imitrex [Sumatriptan] Other (See Comments)    Body hurts  . Oxycodone Nausea And Vomiting  . Avelox [Moxifloxacin] Itching    MEDICATIONS: Current Outpatient Medications on File Prior to Visit  Medication Sig Dispense Refill  . atorvastatin (LIPITOR) 20 MG tablet Take 20 mg by mouth daily.    . cyclobenzaprine (FLEXERIL) 10 MG tablet Take 10 mg by mouth at bedtime as needed for muscle spasms.    [name] dicyclomine (BENTYL) 10 MG capsule Take 20 mg by mouth 3 (three) times daily as needed for cramping.  1  . pantoprazole (PROTONIX) 40 MG tablet Take 40 mg by mouth 2 (two) times daily before a meal.    . sertraline (ZOLOFT) 50 MG tablet Take 50 mg by mouth daily.    . traMADol (ULTRAM) 50 MG tablet Take 1 tablet (50 mg total) by mouth every 6 (six) hours as needed. 10 tablet 0   No current facility-administered medications on file prior to visit.     PAST MEDICAL HISTORY: Past Medical History:  Diagnosis Date  . Anxiety   . Chronic lower back pain   . Depression   . Diverticulitis   . Expressive aphasia 11/15/2016  . Fibromyalgia   . GERD (gastroesophageal reflux disease)   . High cholesterol   . History of gastric  ulcer   . History of hiatal hernia   . History of kidney stones   . History of thyroid nodule   . Hyperlipidemia   . Hypertension   . IBS (irritable bowel syndrome)   . Intermittent vertigo   . Migraine    "a few times/year now; maybe" (11/15/2016)  . Obesity   . OSA on CPAP    wears CPAP nightly ;study in epic 02-01-2014 (11/15/2016)  . Osteoarthritis    "knees, hands, back, hips" (11/15/2016)  . Pneumonia X 1   hx of  . Psychogenic tremor    intermittant head tremor-(11/15/2016)  . Refusal of blood transfusions as patient is Jehovah's Witness   . RLS (restless legs syndrome)   . Sleep apnea   . Urinary  incontinence     PAST SURGICAL HISTORY: Past Surgical History:  Procedure Laterality Date  . ABDOMINAL HYSTERECTOMY  1990  . ANTERIOR CERVICAL DECOMP/DISCECTOMY FUSION  02-10-2004   C4 -- C7  . BACK SURGERY    . CARDIOVASCULAR STRESS TEST  2017   Negative  . CESAREAN SECTION  1977; 1982  . COLONOSCOPY    . CYSTOSCOPY W/ RETROGRADES Right 06/07/2015   Procedure: CYSTOSCOPY WITH RETROGRADE PYELOGRAM;  Surgeon: Ardis Hughs, MD;  Location: [facility];  Service: Urology;  Laterality: Right;  . CYSTOSCOPY WITH URETEROSCOPY AND STENT PLACEMENT Right 06/07/2015   Procedure: RIGHT URETEROSCOPY, LASER LITHOTRIPSY, STONE REMOVAL AND RIGHT URETERAL STENT PLACEMENT;  Surgeon: Ardis Hughs, MD;  Location: [facility];  Service: Urology;  Laterality: Right;  . HERNIA REPAIR    . HOLMIUM LASER APPLICATION N/A 05/04/1307   Procedure: HOLMIUM LASER APPLICATION;  Surgeon: Ardis Hughs, MD;  Location: [facility];  Service: Urology;  Laterality: N/A;  . LAPAROSCOPIC CHOLECYSTECTOMY  10-07-2000  . LIPOMA EXCISION  01/2017   from neck  . UMBILICAL HERNIA REPAIR  age 74  . UPPER GI ENDOSCOPY      SOCIAL HISTORY: Social History   Tobacco Use  . Smoking status: Former Smoker    Packs/day: 0.50    Years: 25.00    Pack years: 12.50    Types: Cigarettes    Last attempt to quit: 07/24/2008    Years since quitting: 10.0  . Smokeless tobacco: Never Used  Substance Use Topics  . Alcohol use: Yes    Alcohol/week: 0.0 standard drinks    Comment: 11/15/2016 "might have 1 drink/month; if that"  . Drug use: No    FAMILY HISTORY: Family History  Problem Relation Age of Onset  . Colon cancer Maternal Grandfather   . Hypertension Maternal Grandfather   . Lung cancer Maternal Grandfather   . Throat cancer Maternal Grandfather   . Hyperlipidemia Mother   . Hypertension Mother   . Sleep apnea Mother   . Hypertension Father   . Heart disease  Father   . Lung cancer Maternal Aunt   . Hypertension Sister   . Hypertension Maternal Grandmother   . CVA Maternal Grandmother   . Hypertension Brother     ROS: Review of Systems  Constitutional: Positive for weight loss. Negative for malaise/fatigue.  Gastrointestinal: Negative for nausea and vomiting.  Musculoskeletal:       Negative for muscle weakness  Positive for muscle aches   Endo/Heme/Allergies: Negative for polydipsia.       Negative for polyuria  Negative for hypoglycemia  Positive for polyphagia     PHYSICAL EXAM: Blood pressure 129/82, pulse (!) 57,  temperature 98.5 F (36.9 C), temperature source Oral, height 5\' 4"  (1.626 m), weight 250 lb (113.4 kg), SpO2 99 %. Body mass index is 42.91 kg/m. Physical Exam  Constitutional: She is oriented to person, place, and time. She appears well-developed and well-nourished.  HENT:  Head: Normocephalic.  Cardiovascular: Normal rate.  Pulmonary/Chest: Effort normal.  Musculoskeletal: Normal range of motion.  Neurological: She is oriented to person, place, and time.  Skin: Skin is warm and dry.  Psychiatric: She has a normal mood and affect. Her behavior is normal.  Vitals reviewed.   RECENT LABS AND TESTS: BMET    Component Value Date/Time   NA 142 08/12/2018 1143   K 4.7 08/12/2018 1143   CL 102 08/12/2018 1143   CO2 17 (L) 08/12/2018 1143   GLUCOSE 91 08/12/2018 1143   GLUCOSE 103 (H) 07/15/2018 1945   BUN 13 08/12/2018 1143   CREATININE 0.78 08/12/2018 1143   CALCIUM 10.4 (H) 08/12/2018 1143   GFRNONAA 84 08/12/2018 1143   GFRAA 97 08/12/2018 1143   Lab Results  Component Value Date   HGBA1C 5.8 (H) 08/12/2018   HGBA1C 5.5 11/28/2014   HGBA1C 5.2 01/08/2010   Lab Results  Component Value Date   INSULIN 12.3 08/12/2018   CBC    Component Value Date/Time   WBC 9.0 07/15/2018 1945   RBC 4.22 07/15/2018 1945   HGB 12.5 07/15/2018 1945   HCT 38.0 07/15/2018 1945   PLT 375 07/15/2018 1945   MCV  90.0 07/15/2018 1945   MCH 29.6 07/15/2018 1945   MCHC 32.9 07/15/2018 1945   RDW 12.3 07/15/2018 1945   LYMPHSABS 2.5 07/15/2018 1945   MONOABS 0.3 07/15/2018 1945   EOSABS 0.1 07/15/2018 1945   BASOSABS 0.0 07/15/2018 1945   Iron/TIBC/Ferritin/ %Sat No results found for: IRON, TIBC, FERRITIN, IRONPCTSAT Lipid Panel     Component Value Date/Time   CHOL 282 (H) 11/16/2016 0039   TRIG 351 (H) 11/16/2016 0039   HDL 39 (L) 11/16/2016 0039   CHOLHDL 7.2 11/16/2016 0039   VLDL 70 (H) 11/16/2016 0039   LDLCALC 173 (H) 11/16/2016 0039   LDLDIRECT 170.1 01/08/2010 0943   Hepatic Function Panel     Component Value Date/Time   PROT 7.4 08/12/2018 1143   ALBUMIN 4.9 08/12/2018 1143   AST 32 08/12/2018 1143   ALT 35 (H) 08/12/2018 1143   ALKPHOS 109 08/12/2018 1143   BILITOT 0.4 08/12/2018 1143   BILIDIR 0.1 10/09/2012 1653      Component Value Date/Time   TSH 1.690 08/12/2018 1143   TSH 1.586 11/15/2016 1917   TSH 2.18 11/28/2014 0725   TSH 1.056 01/12/2014 0335    Ref. Range 08/12/2018 11:43  Vitamin D, 25-Hydroxy Latest Ref Range: 30.0 - 100.0 ng/mL 17.8 (L)    ASSESSMENT AND PLAN: Hypercalcemia  Vitamin D deficiency - Plan: Vitamin D, Ergocalciferol, (DRISDOL) 50000 units CAPS capsule  Pre-diabetes  At risk for diabetes mellitus  Class 3 severe obesity with serious comorbidity and body mass index (BMI) of 40.0 to 44.9 in adult, unspecified obesity type (HCC)  PLAN: Hypercalcemia Paula [name] was informed of the risks associated with low calcium levels. We will check a PTH at next visit. Paula [name] agrees to be well hydrated at next visit.   Vitamin D Deficiency Paula [name] was informed that low vitamin D levels contributes to fatigue and are associated with obesity, breast, and colon cancer. She agrees to continue to take prescription Vit D @50 ,000 IU every week #4  with no refills and will follow up for routine testing of vitamin D, at least 2-3 times per year. She was informed of  the risk of over-replacement of vitamin D and agrees to not increase her dose unless she discusses this with [place] first. Discussed with Paula [name] to increase intake of vitamin D rich foods. She agrees and will follow up with our clinic as directed.   Pre-Diabetes Paula [name] will continue to work on weight loss, exercise, and decreasing simple carbohydrates in her diet to help decrease the risk of diabetes. We dicussed metformin including benefits and risks. She was informed that eating too many simple carbohydrates or too many calories at one sitting increases the likelihood of GI side effects. Paula [name] is undecided on metformin for now and a prescription was not written today. Discussed with Paula [name] to decrease intake of carbohydrates and increase protein. Paula [name] agreed to follow up with [place] as directed to monitor her progress.  Diabetes risk counseling Paula [name] was given extended (15 minutes) diabetes prevention counseling today. She is 57 y.o. female and has risk factors for diabetes including obesity. We discussed intensive lifestyle modifications today with an emphasis on weight loss as well as increasing exercise and decreasing simple carbohydrates in her diet.  Obesity Paula [name] is currently in the action stage of change. As such, her goal is to continue with weight loss efforts She has agreed to follow the Category 3 plan Paula [name] has been instructed to work up to a goal of 150 minutes of combined cardio and strengthening exercise per week for weight loss and overall health benefits. We discussed the following Behavioral Modification Strategies today: increasing lean protein intake, increasing vegetables, increasing water intake, no skipping meals, and to eat all food on her plan.    Paula [name] has agreed to follow up with our clinic in 2 weeks. She was informed of the importance of frequent follow up visits to maximize her success with intensive lifestyle modifications for her multiple health  conditions.   OBESITY BEHAVIORAL INTERVENTION VISIT  Today's visit was # 2   Starting weight: 255 lb Starting date: 08/11/18 Today's weight : 250 lb Today's date: 08/25/18 Total lbs lost to date: 5 lb    ASK: We discussed the diagnosis of obesity with Paula [name] today and Paula [name] agreed to give [place] permission to discuss obesity behavioral modification therapy today.  ASSESS: Paula [name] has the diagnosis of obesity and her BMI today is 42.89 Paula [name] is in the action stage of change   ADVISE: Gerturde was educated on the multiple health risks of obesity as well as the benefit of weight loss to improve her health. She was advised of the need for long term treatment and the importance of lifestyle modifications to improve her current health and to decrease her risk of future health problems.  AGREE: Multiple dietary modification options and treatment options were discussed and  Kimberlly agreed to follow the recommendations documented in the above note.  ARRANGE: Vanesa was educated on the importance of frequent visits to treat obesity as outlined per CMS and USPSTF guidelines and agreed to schedule her next follow up appointment today.  Leary Roca, am acting as transcriptionist for [employer], DO   I have reviewed the above documentation for accuracy and completeness, and I agree with the above. -Jearld Lesch, DO

## 2018-08-27 ENCOUNTER — Ambulatory Visit: Payer: Self-pay | Admitting: Pulmonary Disease

## 2018-08-28 DIAGNOSIS — R7303 Prediabetes: Secondary | ICD-10-CM | POA: Diagnosis not present

## 2018-08-28 DIAGNOSIS — E782 Mixed hyperlipidemia: Secondary | ICD-10-CM | POA: Diagnosis not present

## 2018-08-28 DIAGNOSIS — I10 Essential (primary) hypertension: Secondary | ICD-10-CM | POA: Diagnosis not present

## 2018-08-28 DIAGNOSIS — K219 Gastro-esophageal reflux disease without esophagitis: Secondary | ICD-10-CM | POA: Diagnosis not present

## 2018-08-28 DIAGNOSIS — J4520 Mild intermittent asthma, uncomplicated: Secondary | ICD-10-CM | POA: Diagnosis not present

## 2018-08-28 DIAGNOSIS — F411 Generalized anxiety disorder: Secondary | ICD-10-CM | POA: Diagnosis not present

## 2018-08-31 DIAGNOSIS — G8929 Other chronic pain: Secondary | ICD-10-CM | POA: Diagnosis not present

## 2018-08-31 DIAGNOSIS — G894 Chronic pain syndrome: Secondary | ICD-10-CM | POA: Diagnosis not present

## 2018-08-31 DIAGNOSIS — M545 Low back pain: Secondary | ICD-10-CM | POA: Diagnosis not present

## 2018-08-31 DIAGNOSIS — Z79899 Other long term (current) drug therapy: Secondary | ICD-10-CM | POA: Diagnosis not present

## 2018-09-01 ENCOUNTER — Ambulatory Visit (INDEPENDENT_AMBULATORY_CARE_PROVIDER_SITE_OTHER): Payer: PPO | Admitting: Pulmonary Disease

## 2018-09-01 ENCOUNTER — Encounter: Payer: Self-pay | Admitting: Pulmonary Disease

## 2018-09-01 DIAGNOSIS — R05 Cough: Secondary | ICD-10-CM | POA: Diagnosis not present

## 2018-09-01 DIAGNOSIS — Z9989 Dependence on other enabling machines and devices: Secondary | ICD-10-CM

## 2018-09-01 DIAGNOSIS — R059 Cough, unspecified: Secondary | ICD-10-CM

## 2018-09-01 DIAGNOSIS — G4733 Obstructive sleep apnea (adult) (pediatric): Secondary | ICD-10-CM

## 2018-09-01 NOTE — Assessment & Plan Note (Signed)
Attributed to postnasal drip and GERD Would aim for simultaneous treatment next time  she has a flare

## 2018-09-01 NOTE — Patient Instructions (Signed)
CPAP is working well. Letter given for a cruise.  Call us if cough returns

## 2018-09-01 NOTE — Assessment & Plan Note (Signed)
Weight loss encouraged, compliance with goal of at least 4-6 hrs every night is the expectation. Advised against medications with sedative side effects Cautioned against driving when sleepy - understanding that sleepiness will vary on a day to day basis  Current output auto settings seem to be working well, she is compliant and this is certainly helping improve her daytime somnolence and fatigue

## 2018-09-01 NOTE — Progress Notes (Signed)
   Subjective:    Patient ID: Paula [name], female    DOB: 1961-05-02, 57 y.o.   MRN: [id number]  HPI  57 year old woman for follow-up of OSA & chronic cough attributed to postnasal drip versus GERD  She had a flareup of her chronic cough earlier this year during spring, initially treated for postnasal drip, and then was given Dexilant.  Finally improved more related to time than a due to medications She gained about 20 pounds due to medications and inhalers and has stopped all of these  She is compliant with her CPAP machine with nasal mask and feels rested on waking up.  CPAP is certainly helped improve her daytime somnolence and fatigue. Review download which shows good control of events on auto settings with average of 11 cm, no residuals, and excellent compliance more than 7 hours every night. She is leaving for a cruise to the [place] and needs a letter for CPAP necessity  Significant tests/ events reviewed  PSG 2002 >> CPAP 9cm with a nasal mask PSG 01/2014 - AHI 8/h, RDI 2/h , lowest 79% Placed on autoCPAP with pillows ([facility]) >>avg pr 11 cm    PFTs 11/2015 FEV1 at 99%, ratio 92, no significant bronchodilator response, FVC 85%. DLCO 82%. Spirometry 04/2018  moderate restriction with FEV1 at 71%, ratio 89, FVC 63%.  UGI series 10/2015 >>Esophageal dysmotility with distal barium stasis, likely secondary to reflux. Reflux to the lower esophagus was documented  CT chest 04/2018 showed clear lungs negative for PE. FENO 9 ppb .  Review of Systems neg for any significant sore throat, dysphagia, itching, sneezing, nasal congestion or excess/ purulent secretions, fever, chills, sweats, unintended wt loss, pleuritic or exertional cp, hempoptysis, orthopnea pnd or change in chronic leg swelling. Also denies presyncope, palpitations, heartburn, abdominal pain, nausea, vomiting, diarrhea or change in bowel or urinary habits, dysuria,hematuria, rash, arthralgias, visual  complaints, headache, numbness weakness or ataxia.     Objective:   Physical Exam  Gen. Pleasant, obese, in no distress ENT - no lesions, no post nasal drip Neck: No JVD, no thyromegaly, no carotid bruits Lungs: no use of accessory muscles, no dullness to percussion, decreased without rales or rhonchi  Cardiovascular: Rhythm regular, heart sounds  normal, no murmurs or gallops, no peripheral edema Musculoskeletal: No deformities, no cyanosis or clubbing , no tremors       Assessment & Plan:

## 2018-09-08 ENCOUNTER — Ambulatory Visit (HOSPITAL_COMMUNITY): Payer: PPO | Admitting: Licensed Clinical Social Worker

## 2018-09-08 DIAGNOSIS — F330 Major depressive disorder, recurrent, mild: Secondary | ICD-10-CM

## 2018-09-10 ENCOUNTER — Ambulatory Visit (INDEPENDENT_AMBULATORY_CARE_PROVIDER_SITE_OTHER): Payer: PPO | Admitting: Bariatrics

## 2018-09-10 VITALS — BP 138/72 | HR 62 | Temp 97.9°F | Ht 64.0 in | Wt 249.0 lb

## 2018-09-10 DIAGNOSIS — Z9189 Other specified personal risk factors, not elsewhere classified: Secondary | ICD-10-CM

## 2018-09-10 DIAGNOSIS — I10 Essential (primary) hypertension: Secondary | ICD-10-CM | POA: Diagnosis not present

## 2018-09-10 DIAGNOSIS — G4733 Obstructive sleep apnea (adult) (pediatric): Secondary | ICD-10-CM

## 2018-09-10 DIAGNOSIS — E559 Vitamin D deficiency, unspecified: Secondary | ICD-10-CM

## 2018-09-10 DIAGNOSIS — Z6841 Body Mass Index (BMI) 40.0 and over, adult: Secondary | ICD-10-CM

## 2018-09-10 MED ORDER — VITAMIN D (ERGOCALCIFEROL) 1.25 MG (50000 UNIT) PO CAPS: 50000.0000 [IU] | ORAL_CAPSULE | ORAL | 0 refills | Status: DC

## 2018-09-14 NOTE — Progress Notes (Signed)
Office: [phone]  /  Fax: [phone]   HPI:   Chief Complaint: OBESITY Paula [name] is here to discuss her progress with her obesity treatment plan. She is on the Category 3 plan and is following her eating plan approximately 40 % of the time. She states she is exercising 0 minutes 0 times per week. Paula [name] had increased stress and work hours and less meal planning. She is not having any struggles.  Her weight is 249 lb (112.9 kg) today and has had a weight loss of 1 pound over a period of 2 weeks since her last visit. She has lost 6 lbs since starting treatment with [place].  Hypertension Paula [name] is a 57 y.o. female with hypertension. Livianna denies lightheadedness. She is working on weight loss to help control her blood pressure with the goal of decreasing her risk of heart attack and stroke. Emelin's blood pressure is currently well controlled and she is not on any medications.  Obstructive Sleep Apnea [name] is wearing her CPAP regularly and her CPAP was checked recently.  Vitamin D deficiency [name] has a diagnosis of vitamin D deficiency. She is currently taking high dose vit D and denies nausea, vomiting or muscle weakness.  At risk for osteopenia and osteoporosis [name] is at higher risk of osteopenia and osteoporosis due to vitamin D deficiency.   Hypercalcemia [name] has hypercalcemia and denies fatigue and weakness.  ALLERGIES: Allergies  Allergen Reactions  . Detrol [Tolterodine] Other (See Comments)    slurred speech, tremors, dizziness  . Gabapentin Palpitations and Other (See Comments)    Wt gain, tremor  . Aspirin Other (See Comments)    Avoids--- history of gastric ulcers   . Contrast Media [Iodinated Diagnostic Agents] Hives, Itching and Rash    CT contrast-Needed to take benadryl   . Hydrocodone-Acetaminophen Nausea And Vomiting  . Imitrex [Sumatriptan] Other (See Comments)    Body hurts  . Oxycodone Nausea And Vomiting  . Avelox [Moxifloxacin] Itching      MEDICATIONS: Current Outpatient Medications on File Prior to Visit  Medication Sig Dispense Refill  . atorvastatin (LIPITOR) 20 MG tablet Take 20 mg by mouth daily.    . cyclobenzaprine (FLEXERIL) 10 MG tablet Take 10 mg by mouth at bedtime as needed for muscle spasms.    [name] dicyclomine (BENTYL) 10 MG capsule Take 20 mg by mouth 3 (three) times daily as needed for cramping.  1  . pantoprazole (PROTONIX) 40 MG tablet Take 40 mg by mouth 2 (two) times daily before a meal.    . sertraline (ZOLOFT) 50 MG tablet Take 50 mg by mouth daily.    . traMADol (ULTRAM) 50 MG tablet Take 1 tablet (50 mg total) by mouth every 6 (six) hours as needed. 10 tablet 0   No current facility-administered medications on file prior to visit.     PAST MEDICAL HISTORY: Past Medical History:  Diagnosis Date  . Anxiety   . Chronic lower back pain   . Depression   . Diverticulitis   . Expressive aphasia 11/15/2016  . Fibromyalgia   . GERD (gastroesophageal reflux disease)   . High cholesterol   . History of gastric ulcer   . History of hiatal hernia   . History of kidney stones   . History of thyroid nodule   . Hyperlipidemia   . Hypertension   . IBS (irritable bowel syndrome)   . Intermittent vertigo   . Migraine    "a few times/year now; maybe" (11/15/2016)  .  Obesity   . OSA on CPAP    wears CPAP nightly ;study in epic 02-01-2014 (11/15/2016)  . Osteoarthritis    "knees, hands, back, hips" (11/15/2016)  . Pneumonia X 1   hx of  . Psychogenic tremor    intermittant head tremor-(11/15/2016)  . Refusal of blood transfusions as patient is Jehovah's Witness   . RLS (restless legs syndrome)   . Sleep apnea   . Urinary incontinence     PAST SURGICAL HISTORY: Past Surgical History:  Procedure Laterality Date  . ABDOMINAL HYSTERECTOMY  1990  . ANTERIOR CERVICAL DECOMP/DISCECTOMY FUSION  02-10-2004   C4 -- C7  . BACK SURGERY    . CARDIOVASCULAR STRESS TEST  2017   Negative  . CESAREAN  SECTION  1977; 1982  . COLONOSCOPY    . CYSTOSCOPY W/ RETROGRADES Right 06/07/2015   Procedure: CYSTOSCOPY WITH RETROGRADE PYELOGRAM;  Surgeon: Ardis Hughs, MD;  Location: [facility];  Service: Urology;  Laterality: Right;  . CYSTOSCOPY WITH URETEROSCOPY AND STENT PLACEMENT Right 06/07/2015   Procedure: RIGHT URETEROSCOPY, LASER LITHOTRIPSY, STONE REMOVAL AND RIGHT URETERAL STENT PLACEMENT;  Surgeon: Ardis Hughs, MD;  Location: [facility];  Service: Urology;  Laterality: Right;  . HERNIA REPAIR    . HOLMIUM LASER APPLICATION N/A 0/01/5426   Procedure: HOLMIUM LASER APPLICATION;  Surgeon: Ardis Hughs, MD;  Location: [facility];  Service: Urology;  Laterality: N/A;  . LAPAROSCOPIC CHOLECYSTECTOMY  10-07-2000  . LIPOMA EXCISION  01/2017   from neck  . UMBILICAL HERNIA REPAIR  age 32  . UPPER GI ENDOSCOPY      SOCIAL HISTORY: Social History   Tobacco Use  . Smoking status: Former Smoker    Packs/day: 0.50    Years: 25.00    Pack years: 12.50    Types: Cigarettes    Last attempt to quit: 07/24/2008    Years since quitting: 10.1  . Smokeless tobacco: Never Used  Substance Use Topics  . Alcohol use: Yes    Alcohol/week: 0.0 standard drinks    Comment: 11/15/2016 "might have 1 drink/month; if that"  . Drug use: No    FAMILY HISTORY: Family History  Problem Relation Age of Onset  . Colon cancer Maternal Grandfather   . Hypertension Maternal Grandfather   . Lung cancer Maternal Grandfather   . Throat cancer Maternal Grandfather   . Hyperlipidemia Mother   . Hypertension Mother   . Sleep apnea Mother   . Hypertension Father   . Heart disease Father   . Lung cancer Maternal Aunt   . Hypertension Sister   . Hypertension Maternal Grandmother   . CVA Maternal Grandmother   . Hypertension Brother     ROS: Review of Systems  Constitutional: Positive for weight loss. Negative for malaise/fatigue.  Gastrointestinal:  Negative for nausea and vomiting.  Musculoskeletal:       Negative for muscle weakness.  Neurological:       Negative for lightheadedness.    PHYSICAL EXAM: Blood pressure 138/72, pulse 62, temperature 97.9 F (36.6 C), temperature source Oral, height 5\' 4"  (1.626 m), weight 249 lb (112.9 kg), SpO2 99 %. Body mass index is 42.74 kg/m. Physical Exam  Constitutional: She is oriented to person, place, and time. She appears well-developed and well-nourished.  Cardiovascular: Normal rate.  Musculoskeletal: Normal range of motion.  Neurological: She is oriented to person, place, and time.  Skin: Skin is warm and dry.  Psychiatric: She has a  normal mood and affect. Her behavior is normal.  Vitals reviewed.   RECENT LABS AND TESTS: BMET    Component Value Date/Time   NA 142 08/12/2018 1143   K 4.7 08/12/2018 1143   CL 102 08/12/2018 1143   CO2 17 (L) 08/12/2018 1143   GLUCOSE 91 08/12/2018 1143   GLUCOSE 103 (H) 07/15/2018 1945   BUN 13 08/12/2018 1143   CREATININE 0.78 08/12/2018 1143   CALCIUM 10.4 (H) 08/12/2018 1143   GFRNONAA 84 08/12/2018 1143   GFRAA 97 08/12/2018 1143   Lab Results  Component Value Date   HGBA1C 5.8 (H) 08/12/2018   HGBA1C 5.5 11/28/2014   HGBA1C 5.2 01/08/2010   Lab Results  Component Value Date   INSULIN 12.3 08/12/2018   CBC    Component Value Date/Time   WBC 9.0 07/15/2018 1945   RBC 4.22 07/15/2018 1945   HGB 12.5 07/15/2018 1945   HCT 38.0 07/15/2018 1945   PLT 375 07/15/2018 1945   MCV 90.0 07/15/2018 1945   MCH 29.6 07/15/2018 1945   MCHC 32.9 07/15/2018 1945   RDW 12.3 07/15/2018 1945   LYMPHSABS 2.5 07/15/2018 1945   MONOABS 0.3 07/15/2018 1945   EOSABS 0.1 07/15/2018 1945   BASOSABS 0.0 07/15/2018 1945   Iron/TIBC/Ferritin/ %Sat No results found for: IRON, TIBC, FERRITIN, IRONPCTSAT Lipid Panel     Component Value Date/Time   CHOL 282 (H) 11/16/2016 0039   TRIG 351 (H) 11/16/2016 0039   HDL 39 (L) 11/16/2016 0039    CHOLHDL 7.2 11/16/2016 0039   VLDL 70 (H) 11/16/2016 0039   LDLCALC 173 (H) 11/16/2016 0039   LDLDIRECT 170.1 01/08/2010 0943   Hepatic Function Panel     Component Value Date/Time   PROT 7.4 08/12/2018 1143   ALBUMIN 4.9 08/12/2018 1143   AST 32 08/12/2018 1143   ALT 35 (H) 08/12/2018 1143   ALKPHOS 109 08/12/2018 1143   BILITOT 0.4 08/12/2018 1143   BILIDIR 0.1 10/09/2012 1653      Component Value Date/Time   TSH 1.690 08/12/2018 1143   TSH 1.586 11/15/2016 1917   TSH 2.18 11/28/2014 0725   TSH 1.056 01/12/2014 0335   Results for MIKELA, SENN (MRN [id number]) as of 09/14/2018 10:42  Ref. Range 08/12/2018 11:43  Vitamin D, 25-Hydroxy Latest Ref Range: 30.0 - 100.0 ng/mL 17.8 (L)   ASSESSMENT AND PLAN: Essential hypertension  OSA (obstructive sleep apnea)  Vitamin D deficiency - Plan: Vitamin D, Ergocalciferol, (DRISDOL) 50000 units CAPS capsule  Hypercalcemia - Plan: PTH, Intact and Calcium  At risk for osteoporosis  Class 3 severe obesity with serious comorbidity and body mass index (BMI) of 40.0 to 44.9 in adult, unspecified obesity type ([place])  PLAN:  Hypertension We discussed sodium restriction, working on healthy weight loss, and a regular exercise program as the means to achieve improved blood pressure control.  We will continue to monitor her blood pressure as well as her progress with the above lifestyle modifications. She will begin any hypertensive medications at this time and will watch for signs of hypotension as she continues her lifestyle modifications. Karlen agreed with this plan and agreed to follow up as directed in 2 weeks.  Obstructive Sleep Apnea Eowyn agrees to continue to wear her CPAP nightly.  Vitamin D Deficiency Donnarae was informed that low vitamin D levels contributes to fatigue and are associated with obesity, breast, and colon cancer. She agrees to continue to take high dose Vit D @50 ,000 IU every week #  4 with no refills and will  follow up for routine testing of vitamin D, at least 2-3 times per year. She was informed of the risk of over-replacement of vitamin D and agrees to not increase her dose unless she discusses this with [place] first. She agrees to follow up in 2 weeks.  At risk for osteopenia and osteoporosis Kaleiyah was given extended (15 minutes) osteoporosis prevention counseling today. Ellaree is at risk for osteopenia and osteoporosis due to her vitamin D deficiency. She was encouraged to take her vitamin D and follow her higher calcium diet and increase strengthening exercise to help strengthen her bones and decrease her risk of osteopenia and osteoporosis.  Hypercalcemia We will check a PTH level and see her back for a visit in 2 weeks.  Obesity Sammye is currently in the action stage of change. As such, her goal is to continue with weight loss efforts. She has agreed to follow the Category 3 plan and to do more meal planning. Special has been instructed to work up to a goal of 150 minutes of combined cardio and strengthening exercise per week for weight loss and overall health benefits. We discussed the following Behavioral Modification Strategies today: increasing lean protein intake, decreasing simple carbohydrates, increasing vegetables, increase H2O intake, decrease eating out, keep healthy foods in the home, and travel eating strategies.  Darcus has agreed to follow up with our clinic in 2 weeks. She was informed of the importance of frequent follow up visits to maximize her success with intensive lifestyle modifications for her multiple health conditions.   OBESITY BEHAVIORAL INTERVENTION VISIT  Today's visit was # 4   Starting weight: 255 lbs Starting date: 08/11/18 Today's weight : Weight: 249 lb (112.9 kg)  Today's date: 09/10/2018 Total lbs lost to date: 6  ASK: We discussed the diagnosis of obesity with Melchor Amour today and Justice Rocher agreed to give [place] permission to discuss obesity behavioral  modification therapy today.  ASSESS: Aliscia has the diagnosis of obesity and her BMI today is 42.72. Vieno is in the action stage of change.   ADVISE: Raylyn was educated on the multiple health risks of obesity as well as the benefit of weight loss to improve her health. She was advised of the need for long term treatment and the importance of lifestyle modifications to improve her current health and to decrease her risk of future health problems.  AGREE: Multiple dietary modification options and treatment options were discussed and Anjelita agreed to follow the recommendations documented in the above note.  ARRANGE: Esty was educated on the importance of frequent visits to treat obesity as outlined per CMS and USPSTF guidelines and agreed to schedule her next follow up appointment today.  I, Marcille Blanco, am acting as Location manager for [employer]. Owens Shark, DO  I have reviewed the above documentation for accuracy and completeness, and I agree with the above. -Jearld Lesch, DO
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
## 2018-09-21 ENCOUNTER — Other Ambulatory Visit: Payer: Self-pay | Admitting: Adult Health

## 2018-09-21 ENCOUNTER — Ambulatory Visit (INDEPENDENT_AMBULATORY_CARE_PROVIDER_SITE_OTHER): Payer: PPO | Admitting: Licensed Clinical Social Worker

## 2018-09-21 DIAGNOSIS — F330 Major depressive disorder, recurrent, mild: Secondary | ICD-10-CM

## 2018-09-21 DIAGNOSIS — R21 Rash and other nonspecific skin eruption: Secondary | ICD-10-CM | POA: Diagnosis not present

## 2018-09-22 ENCOUNTER — Ambulatory Visit (INDEPENDENT_AMBULATORY_CARE_PROVIDER_SITE_OTHER): Payer: PPO | Admitting: Adult Health

## 2018-09-22 ENCOUNTER — Encounter: Payer: Self-pay | Admitting: Adult Health

## 2018-09-22 DIAGNOSIS — J0100 Acute maxillary sinusitis, unspecified: Secondary | ICD-10-CM

## 2018-09-22 MED ORDER — AZITHROMYCIN 250 MG PO TABS: ORAL_TABLET | ORAL | 0 refills | Status: AC

## 2018-09-22 MED ORDER — BENZONATATE 200 MG PO CAPS: 200.0000 mg | ORAL_CAPSULE | Freq: Three times a day (TID) | ORAL | 1 refills | Status: AC | PRN

## 2018-09-22 NOTE — Patient Instructions (Signed)
Zpack #1 take as directed.  Begin Claritin 10mg  daily .  Saline nasal rinses Twice daily  .  Begin Flonase 2 puffs daily  Mucinex DM Twice daily  As needed  Cough and congestion .  Delsym 2 tsp Twice daily  For cough , As needed   Tessalon Three times a day As needed  Cough .  Follow up with Dr. Elsworth Soho  As planned and As needed   Please contact office for sooner follow up if symptoms do not improve or worsen or seek emergency care

## 2018-09-22 NOTE — Assessment & Plan Note (Signed)
Flare   Plan  Patient Instructions  Zpack #1 take as directed.  Begin Claritin 10mg  daily .  Saline nasal rinses Twice daily  .  Begin Flonase 2 puffs daily  Mucinex DM Twice daily  As needed  Cough and congestion .  Delsym 2 tsp Twice daily  For cough , As needed   Tessalon Three times a day As needed  Cough .  Follow up with Dr. Elsworth Soho  As planned and As needed   Please contact office for sooner follow up if symptoms do not improve or worsen or seek emergency care

## 2018-09-22 NOTE — Progress Notes (Signed)
@Patient  ID: Paula [name], female    DOB: [birth year], 57 y.o.   MRN: [id number]  Chief Complaint  Patient presents with  . Acute Visit    Cough     Referring provider: Carol Ada, MD  HPI: 57 year old female former smoker followed for sleep apneaand chronic cough   TEST PSG 2002 and was placed on CPAP 9cm with a nasal mask PSG 01/2014 - AHI 8/h, RDI 2/h , lowest 79% Placed on autoCPAP with pillows ([facility]) >>avg pr 11 cm  PFTs 11/2015 FEV1 at 99%, ratio 92, no significant bronchodilator response, FVC 85%. DLCO 82%. Stress myoview 2017 EF 65%, low risk study  Echo 2017 EF nml  May 2019 spirometry shows moderate restriction with FEV1 at 71%, ratio 89, FVC 63%. CT chest May 2019 clear lungs negative PE FENO 9 PPB    09/22/2018 Acute OV : Cough  Patient presents for an acute office visit.  Patient complains of an increased cough over the last week.  She was recently on vacation on a cruise.  Says she started getting cold-like symptoms several days ago.  And symptoms are progressively worsened with increased cough and congestion.  Has nasal drainage, sore throat and hoarseness . Has seen thick nasal discharge with blood mixed in .   Has stopped up ears. Has some sinus pressure. Says several people were sick on vacation .  Patient denies any chest pain orthopnea calf pain or edema.  O2 sat 99% on room air. Taking OTC meds without much help.     Allergies  Allergen Reactions  . Detrol [Tolterodine] Other (See Comments)    slurred speech, tremors, dizziness  . Gabapentin Palpitations and Other (See Comments)    Wt gain, tremor  . Aspirin Other (See Comments)    Avoids--- history of gastric ulcers   . Contrast Media [Iodinated Diagnostic Agents] Hives, Itching and Rash    CT contrast-Needed to take benadryl   . Hydrocodone-Acetaminophen Nausea And Vomiting  . Imitrex [Sumatriptan] Other (See Comments)    Body hurts  . Oxycodone Nausea And Vomiting  . Avelox  [Moxifloxacin] Itching    Immunization History  Administered Date(s) Administered  . Influenza,inj,Quad PF,6+ Mos 09/20/2014  . Tdap 12/30/2011    Past Medical History:  Diagnosis Date  . Anxiety   . Chronic lower back pain   . Depression   . Diverticulitis   . Expressive aphasia 11/15/2016  . Fibromyalgia   . GERD (gastroesophageal reflux disease)   . High cholesterol   . History of gastric ulcer   . History of hiatal hernia   . History of kidney stones   . History of thyroid nodule   . Hyperlipidemia   . Hypertension   . IBS (irritable bowel syndrome)   . Intermittent vertigo   . Migraine    "a few times/year now; maybe" (11/15/2016)  . Obesity   . OSA on CPAP    wears CPAP nightly ;study in epic 02-01-2014 (11/15/2016)  . Osteoarthritis    "knees, hands, back, hips" (11/15/2016)  . Pneumonia X 1   hx of  . Psychogenic tremor    intermittant head tremor-(11/15/2016)  . Refusal of blood transfusions as patient is Jehovah's Witness   . RLS (restless legs syndrome)   . Sleep apnea   . Urinary incontinence     Tobacco History: Social History   Tobacco Use  Smoking Status Former Smoker  . Packs/day: 0.50  . Years: 25.00  . Pack years: 12.50  .  Types: Cigarettes  . Last attempt to quit: 07/24/2008  . Years since quitting: 10.1  Smokeless Tobacco Never Used   Counseling given: Not Answered   Outpatient Medications Prior to Visit  Medication Sig Dispense Refill  . atorvastatin (LIPITOR) 20 MG tablet Take 20 mg by mouth daily.    . cyclobenzaprine (FLEXERIL) 10 MG tablet Take 10 mg by mouth at bedtime as needed for muscle spasms.    [name] dicyclomine (BENTYL) 10 MG capsule Take 20 mg by mouth 3 (three) times daily as needed for cramping.  1  . pantoprazole (PROTONIX) 40 MG tablet Take 40 mg by mouth 2 (two) times daily before a meal.    . sertraline (ZOLOFT) 50 MG tablet Take 50 mg by mouth daily.    . traMADol (ULTRAM) 50 MG tablet Take 1 tablet (50 mg total)  by mouth every 6 (six) hours as needed. 10 tablet 0  . Vitamin D, Ergocalciferol, (DRISDOL) 50000 units CAPS capsule Take 1 capsule (50,000 Units total) by mouth every 7 (seven) days. 4 capsule 0   No facility-administered medications prior to visit.      Review of Systems  Constitutional:   No  weight loss, night sweats,  Fevers, chills, fatigue, or  lassitude.  HEENT:   No headaches,  Difficulty swallowing,  Tooth/dental problems, or  Sore throat,                No sneezing, itching, ear ache, + nasal congestion, post nasal drip,   CV:  No chest pain,  Orthopnea, PND, swelling in lower extremities, anasarca, dizziness, palpitations, syncope.   GI  No heartburn, indigestion, abdominal pain, nausea, vomiting, diarrhea, change in bowel habits, loss of appetite, bloody stools.   Resp: .  No chest wall deformity  Skin: no rash or lesions.  GU: no dysuria, change in color of urine, no urgency or frequency.  No flank pain, no hematuria   MS:  No joint pain or swelling.  No decreased range of motion.  No back pain.    Physical Exam  BP 134/80 (BP Location: Left Arm, Cuff Size: Large)   Pulse 74   Temp 98.1 F (36.7 C) (Oral)   Ht 5' 3.75" (1.619 m)   Wt 257 lb (116.6 kg)   SpO2 99%   BMI 44.46 kg/m   GEN: A/Ox3; pleasant , NAD, obese    HEENT:  [place]/AT,  EACs-clear, TMs-wnl, NOSE-clear drainage , max tenderness  THROAT-clear, no lesions, no postnasal drip or exudate noted.   NECK:  Supple w/ fair ROM; no JVD; normal carotid impulses w/o bruits; no thyromegaly or nodules palpated; no lymphadenopathy.    RESP  Clear  P & A; w/o, wheezes/ rales/ or rhonchi. no accessory muscle use, no dullness to percussion  CARD:  RRR, no m/r/g, no peripheral edema, pulses intact, no cyanosis or clubbing.  GI:   Soft & nt; nml bowel sounds; no organomegaly or masses detected.   Musco: Warm bil, no deformities or joint swelling noted.   Neuro: alert, no focal deficits noted.    Skin:  Warm, no lesions or rashes    Lab Results:  CBC   Imaging: No results found.   Assessment & Plan:   Acute sinusitis Flare   Plan  Patient Instructions  Zpack #1 take as directed.  Begin Claritin 10mg  daily .  Saline nasal rinses Twice daily  .  Begin Flonase 2 puffs daily  Mucinex DM Twice daily  As needed  Cough  and congestion .  Delsym 2 tsp Twice daily  For cough , As needed   Tessalon Three times a day As needed  Cough .  Follow up with Dr. Elsworth Soho  As planned and As needed   Please contact office for sooner follow up if symptoms do not improve or worsen or seek emergency care             Rexene Edison, NP 09/22/2018

## 2018-09-23 DIAGNOSIS — L821 Other seborrheic keratosis: Secondary | ICD-10-CM | POA: Diagnosis not present

## 2018-09-23 DIAGNOSIS — D2239 Melanocytic nevi of other parts of face: Secondary | ICD-10-CM | POA: Diagnosis not present

## 2018-09-23 NOTE — Progress Notes (Signed)
   THERAPIST PROGRESS NOTE  Session Time: 2-3pm GROUP THERAPY  Participation Level: Active  Behavioral Response: Well GroomedAlertEuthymic  Type of Therapy: Group Therapy  Treatment Goals addressed: Anger  Interventions: CBT, Motivational Interviewing and Supportive  Summary: [name] is a 57 y.o. female who presents with depression for group therapy. Client reports she had a good vacatation including positive interactions with her husband. Client reports she often 'pokes the bear' until getting a response she feels is appropriate. Client notes this often goes both ways and her husband will 'push all the right buttons.' Client reports she will make a point to identify what she is wanting out of interactions with her husband as well as attempt to add gratitude to avoid intensifying her own uncomfortable feelings.  Suicidal/Homicidal: Nowithout intent/plan  Therapist Response: Therapist checked in with group members, assessing for SI/HI/psychosis. Clinician requested group members identify which symptoms are currently most problematic and rank symptoms. Clinician presented several breathing exercises as a mindfulness activity to help alleviate some tension in a stressful moment. Clinician demonstrated box breathing and requested client participate. Clinician provided psycho-education on CBT and the connection of thoughts, emotions and behaviors. Clinician and group members briefly processed how expectations for interactions, which are thoughts, play a factor in our responses to situations, or behaviors. Clinician inquired what client 'gets' out of escalating a situation with her husband rather than letting the situation end. Clinician encouraged group members to add gratitude into situations which cause frustration and end the interaction, rather than escalating the event.   Plan: Return again in 1 weeks.  Diagnosis: Axis I: Depressive Disorder NOS      Olegario Messier,  LCSW 09/21/2018

## 2018-09-24 ENCOUNTER — Ambulatory Visit (INDEPENDENT_AMBULATORY_CARE_PROVIDER_SITE_OTHER): Payer: PPO | Admitting: Bariatrics

## 2018-09-24 VITALS — BP 123/77 | HR 67 | Temp 98.0°F | Ht 64.0 in | Wt 252.0 lb

## 2018-09-24 DIAGNOSIS — E559 Vitamin D deficiency, unspecified: Secondary | ICD-10-CM

## 2018-09-24 DIAGNOSIS — Z6841 Body Mass Index (BMI) 40.0 and over, adult: Secondary | ICD-10-CM | POA: Diagnosis not present

## 2018-09-24 DIAGNOSIS — I10 Essential (primary) hypertension: Secondary | ICD-10-CM | POA: Diagnosis not present

## 2018-09-28 ENCOUNTER — Ambulatory Visit (HOSPITAL_COMMUNITY): Payer: Self-pay | Admitting: Licensed Clinical Social Worker

## 2018-09-28 NOTE — Progress Notes (Signed)
Office: [phone]  /  Fax: [phone]   HPI:   Chief Complaint: OBESITY Paula [name] is here to discuss her progress with her obesity treatment plan. She is on the  follow the Category 3 plan and is following her eating plan approximately 20 % of the time. She states she is exercising 0 minutes 0 times per week. Shequita was recently on vacation (cruise) and increased her carbohydrate as well as seafood but was able to increase her walking.  Her weight is 252 lb (114.3 kg) today and gained  since her last visit. She has lost 6 lbs since starting treatment with [place].  Hypertension Paula [name] is a 57 y.o. female with hypertension.  Paula [name] denies chest pain or shortness of breath on exertion. She is working weight loss to help control her blood pressure with the goal of decreasing her risk of heart attack and stroke. Nadines blood pressure is currently controlled. Will continue to monitor her blood pressure.   Vitamin D deficiency Paula [name] has a diagnosis of vitamin D deficiency. She is currently taking vit D and denies nausea, vomiting or muscle weakness.  Hypercalcemia  A PTH was ordered at the last visit. Her Ca=10.4, Vitamin D = 17.8. She is positive for fatigue.       ALLERGIES: Allergies  Allergen Reactions  . Detrol [Tolterodine] Other (See Comments)    slurred speech, tremors, dizziness  . Gabapentin Palpitations and Other (See Comments)    Wt gain, tremor  . Aspirin Other (See Comments)    Avoids--- history of gastric ulcers   . Contrast Media [Iodinated Diagnostic Agents] Hives, Itching and Rash    CT contrast-Needed to take benadryl   . Hydrocodone-Acetaminophen Nausea And Vomiting  . Imitrex [Sumatriptan] Other (See Comments)    Body hurts  . Oxycodone Nausea And Vomiting  . Avelox [Moxifloxacin] Itching    MEDICATIONS: Current Outpatient Medications on File Prior to Visit  Medication Sig Dispense Refill  . atorvastatin (LIPITOR) 20 MG tablet Take 20 mg  by mouth daily.    . benzonatate (TESSALON) 200 MG capsule Take 1 capsule (200 mg total) by mouth 3 (three) times daily as needed for cough. 30 capsule 1  . cyclobenzaprine (FLEXERIL) 10 MG tablet Take 10 mg by mouth at bedtime as needed for muscle spasms.    [name] dicyclomine (BENTYL) 10 MG capsule Take 20 mg by mouth 3 (three) times daily as needed for cramping.  1  . pantoprazole (PROTONIX) 40 MG tablet Take 40 mg by mouth 2 (two) times daily before a meal.    . sertraline (ZOLOFT) 50 MG tablet Take 50 mg by mouth daily.    . traMADol (ULTRAM) 50 MG tablet Take 1 tablet (50 mg total) by mouth every 6 (six) hours as needed. 10 tablet 0  . Vitamin D, Ergocalciferol, (DRISDOL) 50000 units CAPS capsule Take 1 capsule (50,000 Units total) by mouth every 7 (seven) days. 4 capsule 0   No current facility-administered medications on file prior to visit.     PAST MEDICAL HISTORY: Past Medical History:  Diagnosis Date  . Anxiety   . Chronic lower back pain   . Depression   . Diverticulitis   . Expressive aphasia 11/15/2016  . Fibromyalgia   . GERD (gastroesophageal reflux disease)   . High cholesterol   . History of gastric ulcer   . History of hiatal hernia   . History of kidney stones   . History of thyroid nodule   .  Hyperlipidemia   . Hypertension   . IBS (irritable bowel syndrome)   . Intermittent vertigo   . Migraine    "a few times/year now; maybe" (11/15/2016)  . Obesity   . OSA on CPAP    wears CPAP nightly ;study in epic 02-01-2014 (11/15/2016)  . Osteoarthritis    "knees, hands, back, hips" (11/15/2016)  . Pneumonia X 1   hx of  . Psychogenic tremor    intermittant head tremor-(11/15/2016)  . Refusal of blood transfusions as patient is Jehovah's Witness   . RLS (restless legs syndrome)   . Sleep apnea   . Urinary incontinence     PAST SURGICAL HISTORY: Past Surgical History:  Procedure Laterality Date  . ABDOMINAL HYSTERECTOMY  1990  . ANTERIOR CERVICAL  DECOMP/DISCECTOMY FUSION  02-10-2004   C4 -- C7  . BACK SURGERY    . CARDIOVASCULAR STRESS TEST  2017   Negative  . CESAREAN SECTION  1977; 1982  . COLONOSCOPY    . CYSTOSCOPY W/ RETROGRADES Right 06/07/2015   Procedure: CYSTOSCOPY WITH RETROGRADE PYELOGRAM;  Surgeon: Ardis Hughs, MD;  Location: [facility];  Service: Urology;  Laterality: Right;  . CYSTOSCOPY WITH URETEROSCOPY AND STENT PLACEMENT Right 06/07/2015   Procedure: RIGHT URETEROSCOPY, LASER LITHOTRIPSY, STONE REMOVAL AND RIGHT URETERAL STENT PLACEMENT;  Surgeon: Ardis Hughs, MD;  Location: [facility];  Service: Urology;  Laterality: Right;  . HERNIA REPAIR    . HOLMIUM LASER APPLICATION N/A 1/0/2725   Procedure: HOLMIUM LASER APPLICATION;  Surgeon: Ardis Hughs, MD;  Location: [facility];  Service: Urology;  Laterality: N/A;  . LAPAROSCOPIC CHOLECYSTECTOMY  10-07-2000  . LIPOMA EXCISION  01/2017   from neck  . UMBILICAL HERNIA REPAIR  age 85  . UPPER GI ENDOSCOPY      SOCIAL HISTORY: Social History   Tobacco Use  . Smoking status: Former Smoker    Packs/day: 0.50    Years: 25.00    Pack years: 12.50    Types: Cigarettes    Last attempt to quit: 07/24/2008    Years since quitting: 10.1  . Smokeless tobacco: Never Used  Substance Use Topics  . Alcohol use: Yes    Alcohol/week: 0.0 standard drinks    Comment: 11/15/2016 "might have 1 drink/month; if that"  . Drug use: No    FAMILY HISTORY: Family History  Problem Relation Age of Onset  . Colon cancer Maternal Grandfather   . Hypertension Maternal Grandfather   . Lung cancer Maternal Grandfather   . Throat cancer Maternal Grandfather   . Hyperlipidemia Mother   . Hypertension Mother   . Sleep apnea Mother   . Hypertension Father   . Heart disease Father   . Lung cancer Maternal Aunt   . Hypertension Sister   . Hypertension Maternal Grandmother   . CVA Maternal Grandmother   . Hypertension  Brother     ROS: Review of Systems  Constitutional: Positive for malaise/fatigue.  All other systems reviewed and are negative.   PHYSICAL EXAM: Blood pressure 123/77, pulse 67, temperature 98 F (36.7 C), temperature source Oral, height 5\' 4"  (1.626 m), weight 252 lb (114.3 kg), SpO2 100 %. Body mass index is 43.26 kg/m. Physical Exam  Constitutional: She is oriented to person, place, and time. She appears well-developed and well-nourished.  HENT:  Head: Normocephalic.  Eyes: Pupils are equal, round, and reactive to light.  Neck: Normal range of motion.  Pulmonary/Chest: Effort normal.  Musculoskeletal:  Normal range of motion.  Neurological: She is alert and oriented to person, place, and time.  Skin: Skin is warm.  Psychiatric: She has a normal mood and affect. Her behavior is normal.  Vitals reviewed.   RECENT LABS AND TESTS: BMET    Component Value Date/Time   NA 142 08/12/2018 1143   K 4.7 08/12/2018 1143   CL 102 08/12/2018 1143   CO2 17 (L) 08/12/2018 1143   GLUCOSE 91 08/12/2018 1143   GLUCOSE 103 (H) 07/15/2018 1945   BUN 13 08/12/2018 1143   CREATININE 0.78 08/12/2018 1143   CALCIUM 10.4 (H) 08/12/2018 1143   GFRNONAA 84 08/12/2018 1143   GFRAA 97 08/12/2018 1143   Lab Results  Component Value Date   HGBA1C 5.8 (H) 08/12/2018   HGBA1C 5.5 11/28/2014   HGBA1C 5.2 01/08/2010   Lab Results  Component Value Date   INSULIN 12.3 08/12/2018   CBC    Component Value Date/Time   WBC 9.0 07/15/2018 1945   RBC 4.22 07/15/2018 1945   HGB 12.5 07/15/2018 1945   HCT 38.0 07/15/2018 1945   PLT 375 07/15/2018 1945   MCV 90.0 07/15/2018 1945   MCH 29.6 07/15/2018 1945   MCHC 32.9 07/15/2018 1945   RDW 12.3 07/15/2018 1945   LYMPHSABS 2.5 07/15/2018 1945   MONOABS 0.3 07/15/2018 1945   EOSABS 0.1 07/15/2018 1945   BASOSABS 0.0 07/15/2018 1945   Iron/TIBC/Ferritin/ %Sat No results found for: IRON, TIBC, FERRITIN, IRONPCTSAT Lipid Panel     Component  Value Date/Time   CHOL 282 (H) 11/16/2016 0039   TRIG 351 (H) 11/16/2016 0039   HDL 39 (L) 11/16/2016 0039   CHOLHDL 7.2 11/16/2016 0039   VLDL 70 (H) 11/16/2016 0039   LDLCALC 173 (H) 11/16/2016 0039   LDLDIRECT 170.1 01/08/2010 0943   Hepatic Function Panel     Component Value Date/Time   PROT 7.4 08/12/2018 1143   ALBUMIN 4.9 08/12/2018 1143   AST 32 08/12/2018 1143   ALT 35 (H) 08/12/2018 1143   ALKPHOS 109 08/12/2018 1143   BILITOT 0.4 08/12/2018 1143   BILIDIR 0.1 10/09/2012 1653      Component Value Date/Time   TSH 1.690 08/12/2018 1143   TSH 1.586 11/15/2016 1917   TSH 2.18 11/28/2014 0725   TSH 1.056 01/12/2014 0335    ASSESSMENT AND PLAN: Essential hypertension  Vitamin D deficiency  Hypercalcemia  Class 3 severe obesity with serious comorbidity and body mass index (BMI) of 40.0 to 44.9 in adult, unspecified obesity type ([place])  PLAN: Hypertension We discussed sodium restriction, working on healthy weight loss, and a regular exercise program as the means to achieve improved blood pressure control. Yarelin agreed with this plan and agreed to follow up as directed. We will continue to monitor her blood pressure as well as her progress with the above lifestyle modifications. She will continue her medications as prescribed and will watch for signs of hypotension as she continues her lifestyle modifications.  Vitamin D Deficiency Torryn was informed that low vitamin D levels contributes to fatigue and are associated with obesity, breast, and colon cancer. She agrees to continue to take prescription Vit D @50 ,000 IU every week and will follow up for routine testing of vitamin D, at least 2-3 times per year. She was informed of the risk of over-replacement of vitamin D and agrees to not increase her dose unless she discusses this with [place] first.  Hypercalcemia Will wait until next time that she gets  her calcium and Vit D checked and follow up thereafter.     Obesity Glory is currently in the action stage of change. As such, her goal is to continue with weight loss efforts She has agreed to follow the Category 3 plan Nakyra has been instructed to work up to a goal of 150 minutes of combined cardio and strengthening exercise per week for weight loss and overall health benefits. We discussed the following Behavioral Modification Stratagies today: increasing lean protein intake, decreasing simple carbohydrates , increasing vegetables, decrease eating out and work on meal planning and easy cooking plans, no skipping meals and increase her H20 intake (64 OUNCES).    Patria has agreed to follow up with our clinic in 2 weeks. She was informed of the importance of frequent follow up visits to maximize her success with intensive lifestyle modifications for her multiple health conditions.   OBESITY BEHAVIORAL INTERVENTION VISIT  Today's visit was # 5   Starting weight: 255 lbs Starting date: 08/11/18 Today's weight : Weight: 252 lb (114.3 kg)  Today's date: 09/28/2018 Total lbs lost to date: 6 At least 15 minutes were spent on discussing the following behavioral interventions;    ASK: We discussed the diagnosis of obesity with Melchor Amour today and Justice Rocher agreed to give [place] permission to discuss obesity behavioral modification therapy today.  ASSESS: Anelia has the diagnosis of obesity and her BMI today is @TBMI @ Camilla is in the action stage of change   ADVISE: Skylie was educated on the multiple health risks of obesity as well as the benefit of weight loss to improve her health. She was advised of the need for long term treatment and the importance of lifestyle modifications to improve her current health and to decrease her risk of future health problems.  AGREE: Multiple dietary modification options and treatment options were discussed and  Enrique agreed to follow the recommendations documented in the above note.  ARRANGE: Masaye  was educated on the importance of frequent visits to treat obesity as outlined per CMS and USPSTF guidelines and agreed to schedule her next follow up appointment today.  I, April Moore, am acting as transcriptionist for Dr Jearld Lesch.   I have reviewed the above documentation for accuracy and completeness, and I agree with the above. -Jearld Lesch, DO

## 2018-09-30 DIAGNOSIS — D165 Benign neoplasm of lower jaw bone: Secondary | ICD-10-CM | POA: Diagnosis not present

## 2018-10-01 ENCOUNTER — Other Ambulatory Visit: Payer: Self-pay

## 2018-10-01 ENCOUNTER — Encounter (HOSPITAL_BASED_OUTPATIENT_CLINIC_OR_DEPARTMENT_OTHER): Payer: Self-pay | Admitting: Emergency Medicine

## 2018-10-01 ENCOUNTER — Emergency Department (HOSPITAL_BASED_OUTPATIENT_CLINIC_OR_DEPARTMENT_OTHER)
Admission: EM | Admit: 2018-10-01 | Discharge: 2018-10-01 | Disposition: A | Discharge status: Home / Self Care | Payer: PPO | Attending: Emergency Medicine | Admitting: Emergency Medicine

## 2018-10-01 DIAGNOSIS — M545 Low back pain: Secondary | ICD-10-CM | POA: Diagnosis present

## 2018-10-01 DIAGNOSIS — M5432 Sciatica, left side: Secondary | ICD-10-CM | POA: Insufficient documentation

## 2018-10-01 DIAGNOSIS — Z87891 Personal history of nicotine dependence: Secondary | ICD-10-CM | POA: Insufficient documentation

## 2018-10-01 DIAGNOSIS — R252 Cramp and spasm: Secondary | ICD-10-CM | POA: Diagnosis not present

## 2018-10-01 DIAGNOSIS — Z79899 Other long term (current) drug therapy: Secondary | ICD-10-CM | POA: Diagnosis not present

## 2018-10-01 DIAGNOSIS — M5431 Sciatica, right side: Secondary | ICD-10-CM

## 2018-10-01 DIAGNOSIS — M5441 Lumbago with sciatica, right side: Secondary | ICD-10-CM | POA: Diagnosis not present

## 2018-10-01 DIAGNOSIS — I10 Essential (primary) hypertension: Secondary | ICD-10-CM | POA: Insufficient documentation

## 2018-10-01 DIAGNOSIS — M5442 Lumbago with sciatica, left side: Secondary | ICD-10-CM | POA: Diagnosis not present

## 2018-10-01 MED ORDER — TIZANIDINE HCL 4 MG PO TABS: 4.0000 mg | ORAL_TABLET | Freq: Four times a day (QID) | ORAL | 0 refills | Status: DC | PRN

## 2018-10-01 NOTE — Discharge Instructions (Addendum)
Please continue using Voltaren gel for control of your pain and use tizanidine for severe pain.  If you are still having the symptoms 1 week from now, you should take an appointment to see your doctor.

## 2018-10-01 NOTE — ED Provider Notes (Signed)
[facility] EMERGENCY DEPARTMENT Provider Note   CSN: [id number] Arrival date & time: 10/01/18  1056     History   Chief Complaint Chief Complaint  Patient presents with  . Back Pain  . Leg Pain    HPI Paula [name] is a 57 y.o. female with a past medical history significant for anxiety, chronic lower back pain, kidney stones, gastric ulcer who presents with acute low back pain radiating down her legs.  She says that 5 days ago, she was standing for a long period of time outside, and she began to feel pain in her lower back.  Since then the pain has continued, and she has shooting pain down the back of both of her legs.  She also has frequent, intermittent cramping of the anterior aspect of both of her legs.  She has used Tylenol and Voltaren gel for relief of the symptoms.  She says that the Voltaren gel has been somewhat helpful.  She is also tried to drink plenty of water.  She denies IV drug use or constitutional symptoms.    Past Medical History:  Diagnosis Date  . Anxiety   . Chronic lower back pain   . Depression   . Diverticulitis   . Expressive aphasia 11/15/2016  . Fibromyalgia   . GERD (gastroesophageal reflux disease)   . High cholesterol   . History of gastric ulcer   . History of hiatal hernia   . History of kidney stones   . History of thyroid nodule   . Hyperlipidemia   . Hypertension   . IBS (irritable bowel syndrome)   . Intermittent vertigo   . Migraine    "a few times/year now; maybe" (11/15/2016)  . Obesity   . OSA on CPAP    wears CPAP nightly ;study in epic 02-01-2014 (11/15/2016)  . Osteoarthritis    "knees, hands, back, hips" (11/15/2016)  . Pneumonia X 1   hx of  . Psychogenic tremor    intermittant head tremor-(11/15/2016)  . Refusal of blood transfusions as patient is Jehovah's Witness   . RLS (restless legs syndrome)   . Sleep apnea   . Urinary incontinence     Patient Active Problem List   Diagnosis Date Noted  .  Atypical chest pain 08/13/2018  . Depression, major 03/17/2018  . Chronic pain syndrome 03/17/2018  . Expressive aphasia 11/15/2016  . Cough 11/08/2015  . GERD (gastroesophageal reflux disease) 11/08/2015  . B12 deficiency 12/07/2014  . Wart viral 11/07/2014  . Coarse tremors 02/09/2014  . OSA on CPAP 01/28/2014  . Psychosomatic disorder 01/14/2014  . Dyspnea 01/12/2014  . Tremor of face and hands 01/12/2014  . Breast pain in female 02/09/2013  . Elevated WBC count 10/12/2012  . Abdominal pain, other specified site 10/09/2012  . Abdominal bloating 10/09/2012  . Ovarian cystic mass 05/25/2012  . Shoulder pain, right 03/11/2012  . Right arm pain 12/30/2011  . Acute sinusitis 07/30/2010  . ACNE, ROSACEA 07/17/2010  . LEG PAIN 07/17/2010  . NECK PAIN 05/10/2010  . ABDOMINAL PAIN, EPIGASTRIC 05/10/2010  . BURSITIS, LEFT HIP 05/01/2010  . PLANTAR FASCIITIS, BILATERAL 05/01/2010  . HYPERGLYCEMIA 01/05/2010  . PRURITUS 11/27/2009  . TOBACCO USE, QUIT 11/27/2009  . ANXIETY 11/22/2008  . FATIGUE 11/22/2008  . Essential hypertension 10/07/2008  . Insomnia, unspecified 08/30/2008  . Obesity 08/19/2008  . NAUSEA ALONE 05/02/2008  . VERTIGO 01/19/2008  . DIARRHEA 01/19/2008  . THYROID NODULE 06/23/2007  . HYPERLIPIDEMIA 06/23/2007  .  Situational depression 06/23/2007  . ADD 06/23/2007  . TMJ SYNDROME 06/23/2007  . GERD 06/23/2007  . Irritable bowel syndrome 06/23/2007  . Headache 06/23/2007    Past Surgical History:  Procedure Laterality Date  . ABDOMINAL HYSTERECTOMY  1990  . ANTERIOR CERVICAL DECOMP/DISCECTOMY FUSION  02-10-2004   C4 -- C7  . BACK SURGERY    . CARDIOVASCULAR STRESS TEST  2017   Negative  . CESAREAN SECTION  1977; 1982  . COLONOSCOPY    . CYSTOSCOPY W/ RETROGRADES Right 06/07/2015   Procedure: CYSTOSCOPY WITH RETROGRADE PYELOGRAM;  Surgeon: Ardis Hughs, MD;  Location: [facility];  Service: Urology;  Laterality: Right;  . CYSTOSCOPY  WITH URETEROSCOPY AND STENT PLACEMENT Right 06/07/2015   Procedure: RIGHT URETEROSCOPY, LASER LITHOTRIPSY, STONE REMOVAL AND RIGHT URETERAL STENT PLACEMENT;  Surgeon: Ardis Hughs, MD;  Location: [facility];  Service: Urology;  Laterality: Right;  . HERNIA REPAIR    . HOLMIUM LASER APPLICATION N/A 04/05/6567   Procedure: HOLMIUM LASER APPLICATION;  Surgeon: Ardis Hughs, MD;  Location: [facility];  Service: Urology;  Laterality: N/A;  . LAPAROSCOPIC CHOLECYSTECTOMY  10-07-2000  . LIPOMA EXCISION  01/2017   from neck  . UMBILICAL HERNIA REPAIR  age 34  . UPPER GI ENDOSCOPY       OB History    Gravida  3   Para  2   Term  2   Preterm      AB  1   Living  2     SAB      TAB  1   Ectopic      Multiple      Live Births               Home Medications    Prior to Admission medications   Medication Sig Start Date End Date Taking? Authorizing Provider  atorvastatin (LIPITOR) 20 MG tablet Take 20 mg by mouth daily. 05/01/18   [provider]  benzonatate (TESSALON) 200 MG capsule Take 1 capsule (200 mg total) by mouth 3 (three) times daily as needed for cough. 09/22/18 09/22/19  Parrett, Fonnie Mu, NP  cyclobenzaprine (FLEXERIL) 10 MG tablet Take 10 mg by mouth at bedtime as needed for muscle spasms.    [provider]  dicyclomine (BENTYL) 10 MG capsule Take 20 mg by mouth 3 (three) times daily as needed for cramping. 07/09/18   [provider]  pantoprazole (PROTONIX) 40 MG tablet Take 40 mg by mouth 2 (two) times daily before a meal.    [provider]  sertraline (ZOLOFT) 50 MG tablet Take 50 mg by mouth daily.    [provider]  tiZANidine (ZANAFLEX) 4 MG tablet Take 1 tablet (4 mg total) by mouth every 6 (six) hours as needed for muscle spasms. 10/01/18   Kathrene Alu, MD  traMADol (ULTRAM) 50 MG tablet Take 1 tablet (50 mg total) by mouth every 6 (six) hours as needed. 07/15/18    Carlisle Cater, PA-C  Vitamin D, Ergocalciferol, (DRISDOL) 50000 units CAPS capsule Take 1 capsule (50,000 Units total) by mouth every 7 (seven) days. 09/10/18   Georgia Lopes, DO    Family History Family History  Problem Relation Age of Onset  . Colon cancer Maternal Grandfather   . Hypertension Maternal Grandfather   . Lung cancer Maternal Grandfather   . Throat cancer Maternal Grandfather   . Hyperlipidemia Mother   . Hypertension Mother   .  Sleep apnea Mother   . Hypertension Father   . Heart disease Father   . Lung cancer Maternal Aunt   . Hypertension Sister   . Hypertension Maternal Grandmother   . CVA Maternal Grandmother   . Hypertension Brother     Social History Social History   Tobacco Use  . Smoking status: Former Smoker    Packs/day: 0.50    Years: 25.00    Pack years: 12.50    Types: Cigarettes    Last attempt to quit: 07/24/2008    Years since quitting: 10.1  . Smokeless tobacco: Never Used  Substance Use Topics  . Alcohol use: Yes    Alcohol/week: 0.0 standard drinks    Comment: 11/15/2016 "might have 1 drink/month; if that"  . Drug use: No     Allergies   Detrol [tolterodine]; Gabapentin; Aspirin; Contrast media [iodinated diagnostic agents]; Hydrocodone-acetaminophen; Imitrex [sumatriptan]; Oxycodone; and Avelox [moxifloxacin]   Review of Systems Review of Systems  Constitutional: Negative for activity change, appetite change, chills, fatigue and fever.  HENT: Negative for congestion.   Eyes: Negative for pain.  Respiratory: Negative for cough, chest tightness and shortness of breath.   Cardiovascular: Negative for chest pain and leg swelling.  Gastrointestinal: Negative for abdominal pain.  Genitourinary: Negative for difficulty urinating.  Musculoskeletal: Positive for back pain. Negative for neck pain.  Skin: Negative for rash.  Neurological: Negative for weakness and numbness.  Psychiatric/Behavioral: The patient is not  nervous/anxious.      Physical Exam Updated Vital Signs BP (!) 160/84 (BP Location: Right Arm)   Pulse 65   Temp 98.9 F (37.2 C) (Oral)   Resp 18   Ht 5\' 4"  (1.626 m)   Wt 114 kg   SpO2 99%   BMI 43.14 kg/m   Physical Exam  Constitutional: She is oriented to person, place, and time. She appears well-developed and well-nourished. No distress.  HENT:  Head: Normocephalic and atraumatic.  Right Ear: External ear normal.  Left Ear: External ear normal.  Nose: Nose normal.  Eyes: Conjunctivae and EOM are normal.  Neck: Normal range of motion.  Cardiovascular: Normal rate, regular rhythm and normal heart sounds.  Pulmonary/Chest: Effort normal and breath sounds normal. No respiratory distress.  Abdominal: Soft. Bowel sounds are normal. There is no tenderness.  Musculoskeletal: Normal range of motion. She exhibits tenderness. She exhibits no edema or deformity.  Tenderness to palpation of lumbar spine at around L4.  Tenderness to palpation of bilateral calves.  Neurological: She is alert and oriented to person, place, and time. She displays normal reflexes. No sensory deficit. She exhibits normal muscle tone. Coordination normal.  Skin: Skin is warm and dry. She is not diaphoretic.  Psychiatric: She has a normal mood and affect.     ED Treatments / Results  Labs (all labs ordered are listed, but only abnormal results are displayed) Labs Reviewed - No data to display  EKG None  Radiology No results found.  Procedures Procedures (including critical care time)  Medications Ordered in ED Medications - No data to display   Initial Impression / Assessment and Plan / ED Course  I have reviewed the triage vital signs and the nursing notes.  Pertinent labs & imaging results that were available during my care of the patient were reviewed by me and considered in my medical decision making (see chart for details).     Patient likely has a sciatica exacerbation given her  chronic low back pain, arthritis, and radiating  pain down her bilateral lower extremities.  PE is highly unlikely given normal heart rate and respiratory rate and SPO2 of 99% with bilateral rather than unilateral leg symptoms.  Offered to test patient's renal function and electrolytes given her frequent cramping, but she declined this due to anxiety surrounding needles.  Recommended that patient follow-up with her PCP if her symptoms have not improved by 1 week from now.  We will treat symptomatically with continued use of Voltaren gel along with Tylenol for mild pain and Robaxin for severe pain.  She was given return precautions including saddle anesthesia and bladder or bowel incontinence as well as shortness of breath and unilateral leg symptoms.  Final Clinical Impressions(s) / ED Diagnoses   Final diagnoses:  Bilateral sciatica    ED Discharge Orders         Ordered    tiZANidine (ZANAFLEX) 4 MG tablet  Every 6 hours PRN     10/01/18 1254           Kathrene Alu, MD 10/01/18 1302    Margette Fast, MD 10/02/18 [phone]

## 2018-10-01 NOTE — ED Triage Notes (Signed)
Back pain and bilateral leg cramps x 5 days.

## 2018-10-02 ENCOUNTER — Other Ambulatory Visit: Payer: Self-pay | Admitting: *Deleted

## 2018-10-02 NOTE — Patient Outreach (Signed)
[facility]) Care Management  10/02/2018  Paula [name] May 14, 1961 [id number]  Telephone Screen  Referral Date: 10/01/18 Referral Source:  Nurse call center Referral Reason: cramping and pain in leg after standing in one spot for 2 hours, back pain but resolved - Recommended HCP within 4 hours (or pcp triage)  Insurance: HTA   Outreach attempt # 1, successful at the home number  Patient is able to verify HIPAA Reviewed and addressed referral to [facility] with patient  Paula [name] reports "I'm getting there" She confirms her Primary care MD encouraged her to got to the ED on 09/03/18. She discussed her feelings about the ED visit. CM discussed ED visit purpose to get her out of the emergency crisis, encouraged her to re review her d/c instructions and to practice the exercises (back lifts, belly crutches, bridges, pres ups, cat- cow, pelvic tilt and single knee to chest)  listed in the after summary visit. Cm discussed how the exercises helps to relax the sciatica nerve that runs from her back to down both legs. CM discussed if the pain did not decrease she should follow up with Dr Tamala Julian to get further tests completed. Paula [name] voiced understanding.   Social: Paula [name] is independent with all care Has support of family and no issues with transportation to medical appointments   Conditions: HTN, OSA on CPAP, sinusitis, , GERD, IBS, ovarian cystic mass, TMJ syndrome, acne, bilateral plantar fascitis, hyperlipidemia, anxiety, ADD, vertigo, tremors, B12 deficiency, major depression,    Medications: denies concerns with taking medications as prescribed, affording medications, side effects of medications and questions about medications    Appointments: she has a 2 week f/u visit on 10/08/18    Advance Directives: Denies need for assist with advance directives     Consent: [facility] RN CM reviewed [facility] services with patient. Patient gave verbal consent for services. Advised patient  that there may be further post nurse line calls to assess how the patient is doing Patient voiced understanding and was appreciative of f/u call.  Plan: Pt encouraged to return a call to [facility] RN CM prn She is already aware of 24 hr RN line number   [facility] RN CM will close case at this time as patient has been assessed and needs resolved.    [name] L. Lavina Hamman, RN, BSN, [place] Coordinator Office number [phone] Mobile number [phone]  Main [facility] number [phone] Fax number [phone]

## 2018-10-05 ENCOUNTER — Ambulatory Visit (INDEPENDENT_AMBULATORY_CARE_PROVIDER_SITE_OTHER): Payer: PPO | Admitting: Licensed Clinical Social Worker

## 2018-10-05 DIAGNOSIS — M79606 Pain in leg, unspecified: Secondary | ICD-10-CM | POA: Diagnosis not present

## 2018-10-05 DIAGNOSIS — F330 Major depressive disorder, recurrent, mild: Secondary | ICD-10-CM

## 2018-10-05 DIAGNOSIS — M62838 Other muscle spasm: Secondary | ICD-10-CM | POA: Diagnosis not present

## 2018-10-05 NOTE — Progress Notes (Signed)
   THERAPIST PROGRESS NOTE  Session Time: 2pm-3:05pm  Participation Level: Active  Behavioral Response: Well GroomedAlertEuthymic  Type of Therapy: Group Therapy  Treatment Goals addressed: Coping  Interventions: CBT and Supportive  Summary: Paula [name] is a 57 y.o. female who presents with mild episode of major depressive disorder. Client reports she did attempt different interactions with her husband as discussed at previous group session. Clinician inquired about goals of group therapy compared to individual therapy. Client was receptive to re-direction from clinician and open to feedback from other group members. Client verbalized frustration with family dynamics and feeling like the 'black sheep.' Client engaged in discussion about benefits of holding onto uncomfortable emotions and avoidance of difficult conversations. Client reviewed communication handout and role played examples of starting conversations. Client was able to verbalize the connection between thoughts, feelings, and behaviors and agreed to focus on using 'how' questions, rather than 'why' questions in order to focus on problem solving rather than remain in emotional mind.   Suicidal/Homicidal: Nowithout intent/plan  Therapist Response: Clinician met with group and reviewed CBT triangle and connection of thoughts, emotions, and behaviors. Clinician provided communication handout with focus on effective communication skills to help cope with situations. Clinician inquired why group members did or did not want to ask questions that could result in uncomfortable situations/responses. Clinician encouraged clients to specifically identify thoughts and feelings and what the benefit of leaving them unresolved. Clinician encouraged clients to be mindful of the possibility of other perspectives and managing expectations of others based only on our own behaviors and thoughts without previously expressing our needs.  Plan: Return  again in 1 weeks.  Diagnosis: Axis I: Major Depression, single episode    Olegario Messier, LCSW 10/05/2018

## 2018-10-08 ENCOUNTER — Encounter (INDEPENDENT_AMBULATORY_CARE_PROVIDER_SITE_OTHER): Payer: Self-pay | Admitting: Bariatrics

## 2018-10-08 ENCOUNTER — Ambulatory Visit (INDEPENDENT_AMBULATORY_CARE_PROVIDER_SITE_OTHER): Payer: PPO | Admitting: Bariatrics

## 2018-10-08 VITALS — BP 130/83 | HR 63 | Temp 98.3°F | Ht 64.0 in | Wt 250.0 lb

## 2018-10-08 DIAGNOSIS — K219 Gastro-esophageal reflux disease without esophagitis: Secondary | ICD-10-CM

## 2018-10-08 DIAGNOSIS — E559 Vitamin D deficiency, unspecified: Secondary | ICD-10-CM

## 2018-10-08 DIAGNOSIS — Z9189 Other specified personal risk factors, not elsewhere classified: Secondary | ICD-10-CM

## 2018-10-08 DIAGNOSIS — I10 Essential (primary) hypertension: Secondary | ICD-10-CM

## 2018-10-08 DIAGNOSIS — Z6841 Body Mass Index (BMI) 40.0 and over, adult: Secondary | ICD-10-CM

## 2018-10-08 MED ORDER — VITAMIN D (ERGOCALCIFEROL) 1.25 MG (50000 UNIT) PO CAPS: 50000.0000 [IU] | ORAL_CAPSULE | ORAL | 0 refills | Status: DC

## 2018-10-12 ENCOUNTER — Ambulatory Visit (INDEPENDENT_AMBULATORY_CARE_PROVIDER_SITE_OTHER): Payer: PPO | Admitting: Licensed Clinical Social Worker

## 2018-10-12 DIAGNOSIS — F330 Major depressive disorder, recurrent, mild: Secondary | ICD-10-CM

## 2018-10-12 NOTE — Progress Notes (Signed)
Office: [phone]  /  Fax: [phone]   HPI:   Chief Complaint: OBESITY Paula [name] is here to discuss her progress with her obesity treatment plan. She is on the Category 3 plan and is following her eating plan approximately 40 % of the time. She states she is exercising 0 minutes 0 times per week. Paula [name] is going through more stress with her family. 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  Her weight is 250 lb (113.4 kg) today and has had a weight loss of 2 pounds over a period of 2 weeks since her last visit. She has lost 5 lbs since starting treatment with [place].  Vitamin D deficiency Paula [name] has a diagnosis of vitamin D deficiency. She is currently taking high dose vit D and denies nausea, vomiting, or muscle weakness.  At risk for osteopenia and osteoporosis Paula [name] is at higher risk of osteopenia and osteoporosis due to vitamin D deficiency.   Hypertension Paula [name] is a 57 y.o. female with hypertension. She is working on weight loss to help control her blood pressure with the goal of decreasing her risk of heart attack and stroke. Paula [name] blood pressure is reasonably well controlled and she is not taking any medications at this time. Paula [name] denies lightheadeness.  Gastroesophageal Reflux Disease Paula [name] is taking Protonix 40mg  and is doing better.  ALLERGIES: Allergies  Allergen Reactions  . Detrol [Tolterodine] Other (See Comments)    slurred speech, tremors, dizziness  . Gabapentin Palpitations and Other (See Comments)    Wt gain, tremor  . Aspirin Other (See Comments)    Avoids--- history of gastric ulcers   . Contrast Media [Iodinated Diagnostic Agents] Hives, Itching and Rash    CT contrast-Needed to take benadryl   . Hydrocodone-Acetaminophen Nausea And Vomiting  . Imitrex [Sumatriptan] Other (See Comments)    Body hurts  . Oxycodone Nausea And Vomiting  . Avelox [Moxifloxacin] Itching    MEDICATIONS: Current Outpatient Medications  on File Prior to Visit  Medication Sig Dispense Refill  . atorvastatin (LIPITOR) 20 MG tablet Take 20 mg by mouth daily.    . benzonatate (TESSALON) 200 MG capsule Take 1 capsule (200 mg total) by mouth 3 (three) times daily as needed for cough. 30 capsule 1  . cyclobenzaprine (FLEXERIL) 10 MG tablet Take 10 mg by mouth at bedtime as needed for muscle spasms.    [name] dicyclomine (BENTYL) 10 MG capsule Take 20 mg by mouth 3 (three) times daily as needed for cramping.  1  . pantoprazole (PROTONIX) 40 MG tablet Take 40 mg by mouth 2 (two) times daily before a meal.    . sertraline (ZOLOFT) 50 MG tablet Take 50 mg by mouth daily.    [name] tiZANidine (ZANAFLEX) 4 MG tablet Take 1 tablet (4 mg total) by mouth every 6 (six) hours as needed for muscle spasms. 30 tablet 0  . traMADol (ULTRAM) 50 MG tablet Take 1 tablet (50 mg total) by mouth every 6 (six) hours as needed. 10 tablet 0   No current facility-administered medications on file prior to visit.     PAST MEDICAL HISTORY: Past Medical History:  Diagnosis Date  . Anxiety   . Chronic lower back pain   . Depression   . Diverticulitis   . Expressive aphasia 11/15/2016  . Fibromyalgia   . GERD (gastroesophageal reflux disease)   . High cholesterol   . History of gastric ulcer   . History of hiatal  hernia   . History of kidney stones   . History of thyroid nodule   . Hyperlipidemia   . Hypertension   . IBS (irritable bowel syndrome)   . Intermittent vertigo   . Migraine    "a few times/year now; maybe" (11/15/2016)  . Obesity   . OSA on CPAP    wears CPAP nightly ;study in epic 02-01-2014 (11/15/2016)  . Osteoarthritis    "knees, hands, back, hips" (11/15/2016)  . Pneumonia X 1   hx of  . Psychogenic tremor    intermittant head tremor-(11/15/2016)  . Refusal of blood transfusions as patient is Jehovah's Witness   . RLS (restless legs syndrome)   . Sleep apnea   . Urinary incontinence     PAST SURGICAL HISTORY: Past Surgical  History:  Procedure Laterality Date  . ABDOMINAL HYSTERECTOMY  1990  . ANTERIOR CERVICAL DECOMP/DISCECTOMY FUSION  02-10-2004   C4 -- C7  . BACK SURGERY    . CARDIOVASCULAR STRESS TEST  2017   Negative  . CESAREAN SECTION  1977; 1982  . COLONOSCOPY    . CYSTOSCOPY W/ RETROGRADES Right 06/07/2015   Procedure: CYSTOSCOPY WITH RETROGRADE PYELOGRAM;  Surgeon: Ardis Hughs, MD;  Location: [facility];  Service: Urology;  Laterality: Right;  . CYSTOSCOPY WITH URETEROSCOPY AND STENT PLACEMENT Right 06/07/2015   Procedure: RIGHT URETEROSCOPY, LASER LITHOTRIPSY, STONE REMOVAL AND RIGHT URETERAL STENT PLACEMENT;  Surgeon: Ardis Hughs, MD;  Location: [facility];  Service: Urology;  Laterality: Right;  . HERNIA REPAIR    . HOLMIUM LASER APPLICATION N/A 06/09/8920   Procedure: HOLMIUM LASER APPLICATION;  Surgeon: Ardis Hughs, MD;  Location: [facility];  Service: Urology;  Laterality: N/A;  . LAPAROSCOPIC CHOLECYSTECTOMY  10-07-2000  . LIPOMA EXCISION  01/2017   from neck  . UMBILICAL HERNIA REPAIR  age 74  . UPPER GI ENDOSCOPY      SOCIAL HISTORY: Social History   Tobacco Use  . Smoking status: Former Smoker    Packs/day: 0.50    Years: 25.00    Pack years: 12.50    Types: Cigarettes    Last attempt to quit: 07/24/2008    Years since quitting: 10.2  . Smokeless tobacco: Never Used  Substance Use Topics  . Alcohol use: Yes    Alcohol/week: 0.0 standard drinks    Comment: 11/15/2016 "might have 1 drink/month; if that"  . Drug use: No    FAMILY HISTORY: Family History  Problem Relation Age of Onset  . Colon cancer Maternal Grandfather   . Hypertension Maternal Grandfather   . Lung cancer Maternal Grandfather   . Throat cancer Maternal Grandfather   . Hyperlipidemia Mother   . Hypertension Mother   . Sleep apnea Mother   . Hypertension Father   . Heart disease Father   . Lung cancer Maternal Aunt   . Hypertension Sister    . Hypertension Maternal Grandmother   . CVA Maternal Grandmother   . Hypertension Brother     ROS: Review of Systems  Constitutional: Positive for weight loss.  Gastrointestinal: Negative for nausea and vomiting.  Musculoskeletal:       Negative for muscle weakness.  Neurological:       Negative for lightheadedness.    PHYSICAL EXAM: Blood pressure 130/83, pulse 63, temperature 98.3 F (36.8 C), temperature source Oral, height 5\' 4"  (1.626 m), weight 250 lb (113.4 kg), SpO2 97 %. Body mass index is 42.91 kg/m. Physical Exam  Constitutional: She is oriented to person, place, and time. She appears well-developed and well-nourished.  Cardiovascular: Normal rate.  Pulmonary/Chest: Effort normal.  Musculoskeletal: Normal range of motion.  Neurological: She is oriented to person, place, and time.  Skin: Skin is warm and dry.  Psychiatric: She has a normal mood and affect. Her behavior is normal.    RECENT LABS AND TESTS: BMET    Component Value Date/Time   NA 142 08/12/2018 1143   K 4.7 08/12/2018 1143   CL 102 08/12/2018 1143   CO2 17 (L) 08/12/2018 1143   GLUCOSE 91 08/12/2018 1143   GLUCOSE 103 (H) 07/15/2018 1945   BUN 13 08/12/2018 1143   CREATININE 0.78 08/12/2018 1143   CALCIUM 10.4 (H) 08/12/2018 1143   GFRNONAA 84 08/12/2018 1143   GFRAA 97 08/12/2018 1143   Lab Results  Component Value Date   HGBA1C 5.8 (H) 08/12/2018   HGBA1C 5.5 11/28/2014   HGBA1C 5.2 01/08/2010   Lab Results  Component Value Date   INSULIN 12.3 08/12/2018   CBC    Component Value Date/Time   WBC 9.0 07/15/2018 1945   RBC 4.22 07/15/2018 1945   HGB 12.5 07/15/2018 1945   HCT 38.0 07/15/2018 1945   PLT 375 07/15/2018 1945   MCV 90.0 07/15/2018 1945   MCH 29.6 07/15/2018 1945   MCHC 32.9 07/15/2018 1945   RDW 12.3 07/15/2018 1945   LYMPHSABS 2.5 07/15/2018 1945   MONOABS 0.3 07/15/2018 1945   EOSABS 0.1 07/15/2018 1945   BASOSABS 0.0 07/15/2018 1945    Iron/TIBC/Ferritin/ %Sat No results found for: IRON, TIBC, FERRITIN, IRONPCTSAT Lipid Panel     Component Value Date/Time   CHOL 282 (H) 11/16/2016 0039   TRIG 351 (H) 11/16/2016 0039   HDL 39 (L) 11/16/2016 0039   CHOLHDL 7.2 11/16/2016 0039   VLDL 70 (H) 11/16/2016 0039   LDLCALC 173 (H) 11/16/2016 0039   LDLDIRECT 170.1 01/08/2010 0943   Hepatic Function Panel     Component Value Date/Time   PROT 7.4 08/12/2018 1143   ALBUMIN 4.9 08/12/2018 1143   AST 32 08/12/2018 1143   ALT 35 (H) 08/12/2018 1143   ALKPHOS 109 08/12/2018 1143   BILITOT 0.4 08/12/2018 1143   BILIDIR 0.1 10/09/2012 1653      Component Value Date/Time   TSH 1.690 08/12/2018 1143   TSH 1.586 11/15/2016 1917   TSH 2.18 11/28/2014 0725   TSH 1.056 01/12/2014 0335   Results for ASHANNA, HEINSOHN (MRN [id number]) as of 10/12/2018 13:14  Ref. Range 08/12/2018 11:43  Vitamin D, 25-Hydroxy Latest Ref Range: 30.0 - 100.0 ng/mL 17.8 (L)   ASSESSMENT AND PLAN: Vitamin D deficiency - Plan: Vitamin D, Ergocalciferol, (DRISDOL) 1.25 MG (50000 UT) CAPS capsule  Other hyperlipidemia  Gastroesophageal reflux disease, esophagitis presence not specified  At risk for osteoporosis  Class 3 severe obesity with serious comorbidity and body mass index (BMI) of 40.0 to 44.9 in adult, unspecified obesity type ([place])  PLAN:  Vitamin D Deficiency Paula [name] was informed that low vitamin D levels contributes to fatigue and are associated with obesity, breast, and colon cancer. She agrees to continue to take prescription Vit D @50 ,000 IU every week #4 with no refills and will follow up for routine testing of vitamin D, at least 2-3 times per year. She was informed of the risk of over-replacement of vitamin D and agrees to not increase her dose unless she discusses this with [place] first. Paula [name] agrees to follow up  in 2 weeks.  At risk for osteopenia and osteoporosis Paula [name] was given extended (15 minutes) osteoporosis prevention  counseling today. Paula [name] is at risk for osteopenia and osteoporosis due to her vitamin D deficiency. She was encouraged to take her vitamin D and follow her higher calcium diet and increase strengthening exercise to help strengthen her bones and decrease her risk of osteopenia and osteoporosis.  Hypertension We discussed sodium restriction, working on healthy weight loss, and a regular exercise program as the means to achieve improved blood pressure control. We will continue to monitor her blood pressure as well as her progress with the above lifestyle modifications. She will continue to work on measures to control her blood pressure and watch for signs of hypotension as she continues her lifestyle modifications. Paula [name] agreed with this plan and agreed to follow up as directed.  Gastroesophageal Reflux Disease Paula [name] agrees to continue taking Protonix 40mg  and will avoid triggers for GERD.  Obesity Paula [name] is currently in the action stage of change. As such, her goal is to continue with weight loss efforts. She has agreed to follow the Category 3 plan. She will increase water and protein intake. Paula [name] has been instructed to work up to a goal of 150 minutes of combined cardio and strengthening exercise per week for weight loss and overall health benefits. We discussed the following Behavioral Modification Strategies today: increasing lean protein intake, decreasing simple carbohydrates, increasing vegetables, increase H2O intake, and work on meal planning and easy cooking plans.  Paula [name] has agreed to follow up with our clinic in 2 weeks. She was informed of the importance of frequent follow up visits to maximize her success with intensive lifestyle modifications for her multiple health conditions.   OBESITY BEHAVIORAL INTERVENTION VISIT  Today's visit was # 6   Starting weight: 255 lbs Starting date: 08/11/18 Today's weight : Weight: 250 lb (113.4 kg)  Today's date: 10/18/2018 Total lbs lost  to date: 5  ASK: We discussed the diagnosis of obesity with Paula [name] today and Paula [name] agreed to give [place] permission to discuss obesity behavioral modification therapy today.  ASSESS: Dovey has the diagnosis of obesity and her BMI today is 42.89. Nolie is in the action stage of change   ADVISE: Athena was educated on the multiple health risks of obesity as well as the benefit of weight loss to improve her health. She was advised of the need for long term treatment and the importance of lifestyle modifications to improve her current health and to decrease her risk of future health problems.  AGREE: Multiple dietary modification options and treatment options were discussed and Milliani agreed to follow the recommendations documented in the above note.  ARRANGE: Batoul was educated on the importance of frequent visits to treat obesity as outlined per CMS and USPSTF guidelines and agreed to schedule her next follow up appointment today.  I, Marcille Blanco, am acting as Location manager for [employer]. Owens Shark, DO  I have reviewed the above documentation for accuracy and completeness, and I agree with the above. -Jearld Lesch, DO

## 2018-10-13 NOTE — Progress Notes (Signed)
   THERAPIST PROGRESS NOTE  Session Time: 2pm-3pm  Participation Level: Active  Behavioral Response: Well GroomedAlertEuthymic  Type of Therapy: Group Therapy  Treatment Goals addressed: Coping  Interventions: CBT, Motivational Interviewing and Solution Focused  Summary: NZINGA FERRAN is a 57 y.o. female who presents with mild episode of depression. Client reports ongoing discord with her family. Client reports being referred by weight loss clinic and is interested in how to cope with excessive eating. Client is open to using mindfulness skills learned in this group.   Suicidal/Homicidal: Nowithout intent/plan  Therapist Response: The purpose of this group is to utilize CBT skills in a group setting to increase use of healthy coping skills and decrease active mental health symptoms.  Clinician checked in with group members assessing for SI/HI/psychosis and overall level of functioning. Clinician and group members discussed the purpose of group and clinician inquired about specific needs of clients. Clinician inquired about clients recent use of any skills and the effects on relationships. Clinician praised clients attempted use of skills. Clinician and clients processed the frustration of others having different values and expectations not being met. Clinician presented 5 senses skill and utilizing mindfulness in situations to manage levels of anxiety and frustration when clients have a lack of control. Clinician and group members discussed situations where this could be helpful. Clients commented on types of communication styles, which will be a topic for next group in addition to mindfulness relating to eating as an unhelpful coping skill or unhealthy habit.    Plan: Return again in 1-2 weeks.  Diagnosis: Axis I: Depressive Disorder NOS    Olegario Messier, LCSW 10/12/2018

## 2018-10-19 ENCOUNTER — Ambulatory Visit (INDEPENDENT_AMBULATORY_CARE_PROVIDER_SITE_OTHER): Payer: PPO | Admitting: Licensed Clinical Social Worker

## 2018-10-19 DIAGNOSIS — F330 Major depressive disorder, recurrent, mild: Secondary | ICD-10-CM

## 2018-10-19 NOTE — Progress Notes (Signed)
   THERAPIST PROGRESS NOTE  Session Time: 2pm-3pm  Participation Level: Active  Behavioral Response: Well GroomedAlertAnxious and Irritable  Type of Therapy: Individual Therapy  Treatment Goals addressed: Coping  Interventions: CBT, Motivational Interviewing and Supportive  Summary: Paula [name] is a 57 y.o. female who presents with anxiety, irritability, problems with family dynamics. Client presented with frustrations related to communication between herself and the family in regards to her mother being in the hospital, and between herself and her husband in regards to decision making. Client reports she is trying to 'breathe through' frustrating situations and has tried the grounding skill however does not complete it often due to losing focus. Client's homework is to monitor thoughts and feelings around the time she craves a snack.  Suicidal/Homicidal: Nowithout intent/plan  Therapist Response: Clinician met with client assessing for SI/HI/psychosis and overall level of functioning. Clinician inquired about skills used over the past week and the level of effectiveness. Clinician praised client efforts on using distress tolerance skills. Clinician challenged client distorted thinking with 'should have, would have, could have" and requested client be mindful of how many times she uses those phrases over the next week. Clinician presented HALT-BS skill for mindfulness in relation to impulsive behaviors including eating. Clinician and client reviewed the CBT triangle and connection between thoughts, feelings, and behaviors. Clinician and client discussed using mindfulness to identify emotions and thoughts to deal with, rather than self soothing only with food.  Plan: Return again in 1 weeks.  Diagnosis: Axis I: Major Depression, Recurrent severe      Olegario Messier, LCSW 10/19/2018

## 2018-10-22 ENCOUNTER — Encounter (INDEPENDENT_AMBULATORY_CARE_PROVIDER_SITE_OTHER): Payer: Self-pay | Admitting: Bariatrics

## 2018-10-22 ENCOUNTER — Ambulatory Visit (INDEPENDENT_AMBULATORY_CARE_PROVIDER_SITE_OTHER): Payer: PPO | Admitting: Bariatrics

## 2018-10-22 VITALS — BP 136/85 | HR 68 | Temp 97.9°F | Ht 64.0 in | Wt 250.0 lb

## 2018-10-22 DIAGNOSIS — E7849 Other hyperlipidemia: Secondary | ICD-10-CM

## 2018-10-22 DIAGNOSIS — F3289 Other specified depressive episodes: Secondary | ICD-10-CM

## 2018-10-22 DIAGNOSIS — E559 Vitamin D deficiency, unspecified: Secondary | ICD-10-CM | POA: Diagnosis not present

## 2018-10-22 DIAGNOSIS — Z9189 Other specified personal risk factors, not elsewhere classified: Secondary | ICD-10-CM

## 2018-10-22 DIAGNOSIS — Z6841 Body Mass Index (BMI) 40.0 and over, adult: Secondary | ICD-10-CM

## 2018-10-22 DIAGNOSIS — E66813 Obesity, class 3: Secondary | ICD-10-CM

## 2018-10-22 MED ORDER — TOPIRAMATE 50 MG PO TABS: 50.0000 mg | ORAL_TABLET | Freq: Every day | ORAL | 0 refills | Status: DC

## 2018-10-22 MED ORDER — VITAMIN D (ERGOCALCIFEROL) 1.25 MG (50000 UNIT) PO CAPS: 50000.0000 [IU] | ORAL_CAPSULE | ORAL | 0 refills | Status: DC

## 2018-10-26 ENCOUNTER — Ambulatory Visit (INDEPENDENT_AMBULATORY_CARE_PROVIDER_SITE_OTHER): Payer: PPO | Admitting: Licensed Clinical Social Worker

## 2018-10-26 DIAGNOSIS — F330 Major depressive disorder, recurrent, mild: Secondary | ICD-10-CM | POA: Diagnosis not present

## 2018-10-26 NOTE — Progress Notes (Signed)
   THERAPIST PROGRESS NOTE  Session Time: 2pm-3pm  Participation Level: Active  Behavioral Response: Well GroomedAlertAnxious and Irritable  Type of Therapy: Group Therapy  Treatment Goals addressed: Coping  Interventions: CBT and Motivational Interviewing  Summary: ISLAND DOHMEN is a 57 y.o. female who presents with increased stress. Client reports ongoing stress related to not feeling as important to others. Client notes from a young age her sister was 'put on a prediastole and she felt like 'the ugly duckling'  Client verbalized frustration when doing much for others and not feeling as if they care or are supportive in return. Client verbalizes this happens with her family, for close friend recently, and her husband often. Client identifies using food for comfort and is working on identifying feelings and stressors prior to snacking.  Suicidal/Homicidal: Nowithout intent/plan  Therapist Response: The purpose of this group is to utilize CBT and DBT skills in a group setting to increase knowledge of healthy coping skills and decrease intensity of active mental health symptoms. Clinician checked in with group members assessing for SI/HI/psychosis. Clinician inquired about recent use of skills or increase in life stressors. Clinician and group members processed unmet needs being the basis behind some uncomfortable emotions clients have verbalized. Clinician and group members reviewed BASICS, a guideline of healthy eating process, as clients previously identified over-snacking as an unhealthy coping skill. Clinician and group members discussed how to add mindfulness into eating including identifying hunger vs other uncomfortable feelings, focusing attention and 5 senses on eating, assessing levels of satisfaction throughout meal.    Plan: Return again in 1-2 weeks.  Diagnosis: Axis I: Depressive Disorder NOS    Olegario Messier, LCSW 10/26/2018

## 2018-10-28 NOTE — Progress Notes (Signed)
Office: [phone]  /  Fax: [phone]   HPI:   Chief Complaint: OBESITY Paula [name] is here to discuss her progress with her obesity treatment plan. She is on the Category 3 plan and is following her eating plan approximately 50 % of the time. She states she is doing water aerobics for 90 minutes 1 time per week. 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 She is challenged with caring for her husband or mother. She is snacking some at night.  Her weight is 250 lb (113.4 kg) today and has not lost weight since her last visit. She has lost 5 lbs since starting treatment with [place].  Vitamin D deficiency Ryn has a diagnosis of vitamin D deficiency. She is currently taking high dose vit D and denies nausea, vomiting, or muscle weakness.  Hyperlipidemia Earlean has hyperlipidemia and has been trying to improve her cholesterol levels with intensive lifestyle modification including a low saturated fat diet, exercise and weight loss. She is taking atorvastatin 20mg  and denies myalgias.  At risk for cardiovascular disease Jonny is at a higher than average risk for cardiovascular disease due to hyperlipidemia and obesity. She currently denies any chest pain.  Depression with emotional eating behaviors Ariya is struggling with emotional eating and using food for comfort to the extent that it is negatively impacting her health. She often snacks when she is not hungry. Ariane sometimes feels she is out of control and then feels guilty that she made poor food choices. She has been working on behavior modification techniques to help reduce her emotional eating and has been somewhat successful. Anu has seen Dr. Mallie Mussel and she was referred to a therapist.  ALLERGIES: Allergies  Allergen Reactions  . Detrol [Tolterodine] Other (See Comments)    slurred speech, tremors, dizziness  . Gabapentin Palpitations and Other (See Comments)    Wt gain, tremor  . Aspirin Other (See Comments)    Avoids--- history  of gastric ulcers   . Contrast Media [Iodinated Diagnostic Agents] Hives, Itching and Rash    CT contrast-Needed to take benadryl   . Hydrocodone-Acetaminophen Nausea And Vomiting  . Imitrex [Sumatriptan] Other (See Comments)    Body hurts  . Oxycodone Nausea And Vomiting  . Avelox [Moxifloxacin] Itching    MEDICATIONS: Current Outpatient Medications on File Prior to Visit  Medication Sig Dispense Refill  . atorvastatin (LIPITOR) 20 MG tablet Take 20 mg by mouth daily.    . benzonatate (TESSALON) 200 MG capsule Take 1 capsule (200 mg total) by mouth 3 (three) times daily as needed for cough. 30 capsule 1  . cyclobenzaprine (FLEXERIL) 10 MG tablet Take 10 mg by mouth at bedtime as needed for muscle spasms.    [name] dicyclomine (BENTYL) 10 MG capsule Take 20 mg by mouth 3 (three) times daily as needed for cramping.  1  . pantoprazole (PROTONIX) 40 MG tablet Take 40 mg by mouth 2 (two) times daily before a meal.    . sertraline (ZOLOFT) 50 MG tablet Take 50 mg by mouth daily.    [name] tiZANidine (ZANAFLEX) 4 MG tablet Take 1 tablet (4 mg total) by mouth every 6 (six) hours as needed for muscle spasms. 30 tablet 0  . traMADol (ULTRAM) 50 MG tablet Take 1 tablet (50 mg total) by mouth every 6 (six) hours as needed. 10 tablet 0   No current facility-administered medications on file prior to visit.     PAST MEDICAL HISTORY: Past Medical History:  Diagnosis Date  .  Anxiety   . Chronic lower back pain   . Depression   . Diverticulitis   . Expressive aphasia 11/15/2016  . Fibromyalgia   . GERD (gastroesophageal reflux disease)   . High cholesterol   . History of gastric ulcer   . History of hiatal hernia   . History of kidney stones   . History of thyroid nodule   . Hyperlipidemia   . Hypertension   . IBS (irritable bowel syndrome)   . Intermittent vertigo   . Migraine    "a few times/year now; maybe" (11/15/2016)  . Obesity   . OSA on CPAP    wears CPAP nightly ;study in epic  02-01-2014 (11/15/2016)  . Osteoarthritis    "knees, hands, back, hips" (11/15/2016)  . Pneumonia X 1   hx of  . Psychogenic tremor    intermittant head tremor-(11/15/2016)  . Refusal of blood transfusions as patient is Jehovah's Witness   . RLS (restless legs syndrome)   . Sleep apnea   . Urinary incontinence     PAST SURGICAL HISTORY: Past Surgical History:  Procedure Laterality Date  . ABDOMINAL HYSTERECTOMY  1990  . ANTERIOR CERVICAL DECOMP/DISCECTOMY FUSION  02-10-2004   C4 -- C7  . BACK SURGERY    . CARDIOVASCULAR STRESS TEST  2017   Negative  . CESAREAN SECTION  1977; 1982  . COLONOSCOPY    . CYSTOSCOPY W/ RETROGRADES Right 06/07/2015   Procedure: CYSTOSCOPY WITH RETROGRADE PYELOGRAM;  Surgeon: Ardis Hughs, MD;  Location: [facility];  Service: Urology;  Laterality: Right;  . CYSTOSCOPY WITH URETEROSCOPY AND STENT PLACEMENT Right 06/07/2015   Procedure: RIGHT URETEROSCOPY, LASER LITHOTRIPSY, STONE REMOVAL AND RIGHT URETERAL STENT PLACEMENT;  Surgeon: Ardis Hughs, MD;  Location: [facility];  Service: Urology;  Laterality: Right;  . HERNIA REPAIR    . HOLMIUM LASER APPLICATION N/A 03/03/7061   Procedure: HOLMIUM LASER APPLICATION;  Surgeon: Ardis Hughs, MD;  Location: [facility];  Service: Urology;  Laterality: N/A;  . LAPAROSCOPIC CHOLECYSTECTOMY  10-07-2000  . LIPOMA EXCISION  01/2017   from neck  . UMBILICAL HERNIA REPAIR  age 3  . UPPER GI ENDOSCOPY      SOCIAL HISTORY: Social History   Tobacco Use  . Smoking status: Former Smoker    Packs/day: 0.50    Years: 25.00    Pack years: 12.50    Types: Cigarettes    Last attempt to quit: 07/24/2008    Years since quitting: 10.2  . Smokeless tobacco: Never Used  Substance Use Topics  . Alcohol use: Yes    Alcohol/week: 0.0 standard drinks    Comment: 11/15/2016 "might have 1 drink/month; if that"  . Drug use: No    FAMILY HISTORY: Family History    Problem Relation Age of Onset  . Colon cancer Maternal Grandfather   . Hypertension Maternal Grandfather   . Lung cancer Maternal Grandfather   . Throat cancer Maternal Grandfather   . Hyperlipidemia Mother   . Hypertension Mother   . Sleep apnea Mother   . Hypertension Father   . Heart disease Father   . Lung cancer Maternal Aunt   . Hypertension Sister   . Hypertension Maternal Grandmother   . CVA Maternal Grandmother   . Hypertension Brother     ROS: Review of Systems  Constitutional: Negative for weight loss.  Cardiovascular: Negative for chest pain.  Gastrointestinal: Positive for nausea. Negative for vomiting.  Musculoskeletal: Negative for  myalgias.       Negative for muscle weakness.  Psychiatric/Behavioral: Positive for depression.    PHYSICAL EXAM: Blood pressure 136/85, pulse 68, temperature 97.9 F (36.6 C), temperature source Oral, height 5\' 4"  (1.626 m), weight 250 lb (113.4 kg), SpO2 98 %. Body mass index is 42.91 kg/m. Physical Exam  Constitutional: She is oriented to person, place, and time. She appears well-developed and well-nourished.  Cardiovascular: Normal rate.  Pulmonary/Chest: Effort normal.  Musculoskeletal: Normal range of motion.  Neurological: She is oriented to person, place, and time.  Skin: Skin is warm and dry.  Psychiatric: She has a normal mood and affect. Her behavior is normal.  Vitals reviewed.   RECENT LABS AND TESTS: BMET    Component Value Date/Time   NA 142 08/12/2018 1143   K 4.7 08/12/2018 1143   CL 102 08/12/2018 1143   CO2 17 (L) 08/12/2018 1143   GLUCOSE 91 08/12/2018 1143   GLUCOSE 103 (H) 07/15/2018 1945   BUN 13 08/12/2018 1143   CREATININE 0.78 08/12/2018 1143   CALCIUM 10.4 (H) 08/12/2018 1143   GFRNONAA 84 08/12/2018 1143   GFRAA 97 08/12/2018 1143   Lab Results  Component Value Date   HGBA1C 5.8 (H) 08/12/2018   HGBA1C 5.5 11/28/2014   HGBA1C 5.2 01/08/2010   Lab Results  Component Value Date    INSULIN 12.3 08/12/2018   CBC    Component Value Date/Time   WBC 9.0 07/15/2018 1945   RBC 4.22 07/15/2018 1945   HGB 12.5 07/15/2018 1945   HCT 38.0 07/15/2018 1945   PLT 375 07/15/2018 1945   MCV 90.0 07/15/2018 1945   MCH 29.6 07/15/2018 1945   MCHC 32.9 07/15/2018 1945   RDW 12.3 07/15/2018 1945   LYMPHSABS 2.5 07/15/2018 1945   MONOABS 0.3 07/15/2018 1945   EOSABS 0.1 07/15/2018 1945   BASOSABS 0.0 07/15/2018 1945   Iron/TIBC/Ferritin/ %Sat No results found for: IRON, TIBC, FERRITIN, IRONPCTSAT Lipid Panel     Component Value Date/Time   CHOL 282 (H) 11/16/2016 0039   TRIG 351 (H) 11/16/2016 0039   HDL 39 (L) 11/16/2016 0039   CHOLHDL 7.2 11/16/2016 0039   VLDL 70 (H) 11/16/2016 0039   LDLCALC 173 (H) 11/16/2016 0039   LDLDIRECT 170.1 01/08/2010 0943   Hepatic Function Panel     Component Value Date/Time   PROT 7.4 08/12/2018 1143   ALBUMIN 4.9 08/12/2018 1143   AST 32 08/12/2018 1143   ALT 35 (H) 08/12/2018 1143   ALKPHOS 109 08/12/2018 1143   BILITOT 0.4 08/12/2018 1143   BILIDIR 0.1 10/09/2012 1653      Component Value Date/Time   TSH 1.690 08/12/2018 1143   TSH 1.586 11/15/2016 1917   TSH 2.18 11/28/2014 0725   TSH 1.056 01/12/2014 0335   Results for YARITZY, HUSER (MRN [id number]) as of 10/28/2018 07:51  Ref. Range 08/12/2018 11:43  Vitamin D, 25-Hydroxy Latest Ref Range: 30.0 - 100.0 ng/mL 17.8 (L)   ASSESSMENT AND PLAN: Vitamin D deficiency - Plan: Vitamin D, Ergocalciferol, (DRISDOL) 1.25 MG (50000 UT) CAPS capsule  Other hyperlipidemia  Other depression - with emotional eating - Plan: topiramate (TOPAMAX) 50 MG tablet  At risk for heart disease  Class 3 severe obesity with serious comorbidity and body mass index (BMI) of 40.0 to 44.9 in adult, unspecified obesity type (HCC)  PLAN:  Vitamin D Deficiency Symiah was informed that low vitamin D levels contributes to fatigue and are associated with obesity, breast, and  colon cancer. She  agrees to continue to take prescription Vit D @50 ,000 IU weekly #4 with no refills and will follow up for routine testing of vitamin D, at least 2-3 times per year. She was informed of the risk of over-replacement of vitamin D and agrees to not increase her dose unless she discusses this with [place] first. Cesily agrees to follow up in 2 weeks.  Hyperlipidemia Makhia was informed of the American Heart Association Guidelines emphasizing intensive lifestyle modifications as the first line treatment for hyperlipidemia. We discussed many lifestyle modifications today in depth, and Cathren will continue to work on decreasing saturated fats such as fatty red meat, butter and many fried foods. She will also increase vegetables and lean protein in her diet and continue to work on exercise and weight loss efforts. Ryliee agrees to continue taking her medication and will follow up at the agreed upon time.  Cardiovascular risk counseling Peighton was given extended (15 minutes) coronary artery disease prevention counseling today. She is 56 y.o. female and has risk factors for heart disease including hyperlipidemia and obesity. We discussed intensive lifestyle modifications today with an emphasis on specific weight loss instructions and strategies. Pt was also informed of the importance of increasing exercise and decreasing saturated fats to help prevent heart disease.  Depression with Emotional Eating Behaviors We discussed behavior modification techniques today to help Haeley deal with her emotional eating and depression. She has agreed to continue to take Topamax 50mg , 1 every day at 5:00pm #30 with no refills and agreed to follow up as directed.  Obesity Daena is currently in the action stage of change. As such, her goal is to continue with weight loss efforts. She has agreed to follow the Category 3 plan and do more meal planning. She agrees to decrease stress eating and we discussed cognitive behavioral therapy  strategies for stress eating. Sadako has been instructed to work up to a goal of 150 minutes of combined cardio and strengthening exercise per week for weight loss and overall health benefits. We discussed the following Behavioral Modification Strategies today: increasing lean protein intake, decreasing simple carbohydrates, increasing vegetables, increase H2O intake, work on meal planning and easy cooking plans, better snacking choices, celebration eating strategies, holiday eating strategies, emotional eating strategies, and ways to avoid night time snacking.  Reatha has agreed to follow up with our clinic in 2 weeks. She was informed of the importance of frequent follow up visits to maximize her success with intensive lifestyle modifications for her multiple health conditions.   OBESITY BEHAVIORAL INTERVENTION VISIT  Today's visit was # 7   Starting weight: 255 lbs Starting date: 08/11/18 Today's weight : Weight: 250 lb (113.4 kg)  Today's date: 10/22/2018 Total lbs lost to date: 5  ASK: We discussed the diagnosis of obesity with Melchor Amour today and Justice Rocher agreed to give [place] permission to discuss obesity behavioral modification therapy today.  ASSESS: Tyeesha has the diagnosis of obesity and her BMI today is 42.89. Krysta is in the action stage of change.   ADVISE: Joci was educated on the multiple health risks of obesity as well as the benefit of weight loss to improve her health. She was advised of the need for long term treatment and the importance of lifestyle modifications to improve her current health and to decrease her risk of future health problems.  AGREE: Multiple dietary modification options and treatment options were discussed and Perlie agreed to follow the recommendations documented in the above note.  ARRANGE: Zanobia was educated on the importance of frequent visits to treat obesity as outlined per CMS and USPSTF guidelines and agreed to schedule her next follow  up appointment today.  I, Marcille Blanco, am acting as Location manager for [employer]. Owens Shark, DO  I have reviewed the above documentation for accuracy and completeness, and I agree with the above. -Jearld Lesch, DO

## 2018-11-02 ENCOUNTER — Ambulatory Visit (INDEPENDENT_AMBULATORY_CARE_PROVIDER_SITE_OTHER): Payer: PPO | Admitting: Licensed Clinical Social Worker

## 2018-11-02 DIAGNOSIS — F330 Major depressive disorder, recurrent, mild: Secondary | ICD-10-CM

## 2018-11-02 NOTE — Progress Notes (Signed)
   THERAPIST PROGRESS NOTE  Session Time: 2pm-3pm  Participation Level: Active  Behavioral Response: Well GroomedAlertAnxious, Depressed and Dysphoric  Type of Therapy: Group Therapy  Treatment Goals addressed: Coping  Interventions: CBT, Motivational Interviewing and Supportive  Summary: Paula [name] is a 57 y.o. female who presents with depression and stress with family. Client reports ongoing frustration at the relationships with her husband and other family members. Client was open to some alternate perspectives on situations from clinician and group members however remained firm her point of view was the only correct one. Client identified that it doesn't matter how much work she does if her husband and family are going to continue showing disrespectful behavior. Client denies SI/HI/psychosis however does not believe she will get the needed support in therapy and the weightloss program if no changes are going to be made in her home life.   Suicidal/Homicidal: Nowithout intent/plan  Therapist Response:  The purpose of this group is to utilize CBT and DBT skills in a group setting to increase use of healthy coping skills and decrease intensity of active mental health symptoms. Clinician checked in with client assessing for SI/HI/psychosis. Clinician inquired about stressors over the holiday and skills attempted and the effectiveness. Clinician presented the topic of Anger and Emotional Regulation. Clinician and group members discussed emotions which could be expressed as anger and why this could be. Clinician provided reflective statements and utilize socratic questioning to encourage clients to identify specific thoughts related to triggering events. Clinician validated client feelings. Clinician presented Emotional Regulation Skills of Opposite Action and Check the Facts.   Plan: Return again in 1-2 weeks.  Diagnosis: Axis I: Depressive Disorder NOS      Olegario Messier,  LCSW 11/02/2018

## 2018-11-05 DIAGNOSIS — G4733 Obstructive sleep apnea (adult) (pediatric): Secondary | ICD-10-CM | POA: Diagnosis not present

## 2018-11-09 ENCOUNTER — Ambulatory Visit (INDEPENDENT_AMBULATORY_CARE_PROVIDER_SITE_OTHER): Payer: PPO | Admitting: Licensed Clinical Social Worker

## 2018-11-09 DIAGNOSIS — F330 Major depressive disorder, recurrent, mild: Secondary | ICD-10-CM | POA: Diagnosis not present

## 2018-11-09 NOTE — Progress Notes (Signed)
   THERAPIST PROGRESS NOTE  Session Time: 2-3pm  Participation Level: Active  Behavioral Response: Well GroomedAlertDepressed and Irritable  Type of Therapy: Group Therapy  Treatment Goals addressed: Coping  Interventions: CBT, Motivational Interviewing and Supportive  Summary: Paula [name] is a 57 y.o. female who presents with depression. Client shared ongoing struggles with relationship with her mother and husband. Client continues to feel unsupported by her family. Client reports she is 50/50 on continuing or giving up with therapy and the weight loss program. Client identifies there are things not going well in life which is why she is in therapy, but struggles to implement skills regularly and focus on personal accountability.    Suicidal/Homicidal: Nowithout intent/plan  Therapist Response: Clinician met with group assessing for SI/HI/psychosis/substance use, and overall level of functioning. Clinician inquired about skills used and increased stressors in the past week. Clinician facilitated discussion on getting needs met and how unmet needs affects our mood and behaviors. Clinician presented clients with skills reminder Life SAVERS, reviewing mindfulness, meditation, visualization, exercise, reading, and journaling. Clinician challenged group members to add an action of self care into their week, focusing on finding positive experiences even on difficult days. Homework: complete Positive Self-Talk Journal.  Plan: Return again in 1 week for individual session 2 weeks for group session  Diagnosis: Axis I: Major Depression, Recurrent severe    Olegario Messier, LCSW 11/09/2018

## 2018-11-10 ENCOUNTER — Encounter (INDEPENDENT_AMBULATORY_CARE_PROVIDER_SITE_OTHER): Payer: Self-pay | Admitting: Bariatrics

## 2018-11-10 ENCOUNTER — Ambulatory Visit (INDEPENDENT_AMBULATORY_CARE_PROVIDER_SITE_OTHER): Payer: PPO | Admitting: Bariatrics

## 2018-11-10 VITALS — BP 134/69 | HR 69 | Temp 98.0°F | Ht 64.0 in | Wt 259.0 lb

## 2018-11-10 DIAGNOSIS — Z6841 Body Mass Index (BMI) 40.0 and over, adult: Secondary | ICD-10-CM | POA: Diagnosis not present

## 2018-11-10 DIAGNOSIS — E559 Vitamin D deficiency, unspecified: Secondary | ICD-10-CM | POA: Diagnosis not present

## 2018-11-10 DIAGNOSIS — F3289 Other specified depressive episodes: Secondary | ICD-10-CM | POA: Diagnosis not present

## 2018-11-10 DIAGNOSIS — R7303 Prediabetes: Secondary | ICD-10-CM | POA: Diagnosis not present

## 2018-11-10 MED ORDER — METFORMIN HCL 500 MG PO TABS: 500.0000 mg | ORAL_TABLET | Freq: Every day | ORAL | 0 refills | Status: DC

## 2018-11-10 NOTE — Progress Notes (Signed)
Office: [phone]  /  Fax: [phone]   HPI:   Chief Complaint: OBESITY Paula [name] is here to discuss her progress with her obesity treatment plan. She is on the Category 3 plan and is following her eating plan approximately 20 % of the time. She states she is exercising 0 minutes 0 times per week. Paula [name] is doing well overall, but she is snacking in the evening and at night. Her weight is 259 lb (117.5 kg) today and has had a weight loss of 1 pound over a period of 2 weeks since her last visit. She has lost 6 lbs since starting treatment with [place].  Vitamin D deficiency Paula [name] has a diagnosis of vitamin D deficiency. She is currently taking high dose prescription vit D and denies nausea, vomiting or muscle weakness.  Pre-Diabetes Paula [name] has a diagnosis of prediabetes based on her elevated Hgb A1c of 5.8 and insulin level of 12.3 and was informed this puts her at greater risk of developing diabetes. She is not taking metformin currently and continues to work on diet and exercise to decrease risk of diabetes. She admits polyphagia.  Depression with emotional eating behaviors Paula [name] is taking Topamax for emotional eating. She struggles with emotional eating and using food for comfort to the extent that it is negatively impacting her health. She often snacks when she is not hungry. Paula [name] sometimes feels she is out of control and then feels guilty that she made poor food choices. She has been working on behavior modification techniques to help reduce her emotional eating and has been somewhat successful. She shows no sign of suicidal or homicidal ideations.  Depression screen [facility] 2/9 10/02/2018 08/25/2018 08/11/2018 03/26/2018 03/17/2018  Decreased Interest 1 1 2 1 1   Down, Depressed, Hopeless 0 1 3 1 1   PHQ - 2 Score 1 2 5 2 2   Altered sleeping - 2 2 2 3   Tired, decreased energy - 2 3 2 3   Change in appetite - 3 3 1 1   Feeling bad or failure about yourself  - 1 3 1 1   Trouble concentrating - 3  2 1 1   Moving slowly or fidgety/restless - 1 1 1 1   Suicidal thoughts - 0 1 0 1  PHQ-9 Score - 14 20 10 13   Difficult doing work/chores - - Very difficult Very difficult Very difficult  Some recent data might be hidden     ALLERGIES: Allergies  Allergen Reactions  . Detrol [Tolterodine] Other (See Comments)    slurred speech, tremors, dizziness  . Gabapentin Palpitations and Other (See Comments)    Wt gain, tremor  . Aspirin Other (See Comments)    Avoids--- history of gastric ulcers   . Contrast Media [Iodinated Diagnostic Agents] Hives, Itching and Rash    CT contrast-Needed to take benadryl   . Hydrocodone-Acetaminophen Nausea And Vomiting  . Imitrex [Sumatriptan] Other (See Comments)    Body hurts  . Oxycodone Nausea And Vomiting  . Avelox [Moxifloxacin] Itching    MEDICATIONS: Current Outpatient Medications on File Prior to Visit  Medication Sig Dispense Refill  . atorvastatin (LIPITOR) 20 MG tablet Take 20 mg by mouth daily.    . benzonatate (TESSALON) 200 MG capsule Take 1 capsule (200 mg total) by mouth 3 (three) times daily as needed for cough. 30 capsule 1  . cyclobenzaprine (FLEXERIL) 10 MG tablet Take 10 mg by mouth at bedtime as needed for muscle spasms.    [name] dicyclomine (BENTYL) 10 MG capsule Take 20 mg  by mouth 3 (three) times daily as needed for cramping.  1  . pantoprazole (PROTONIX) 40 MG tablet Take 40 mg by mouth 2 (two) times daily before a meal.    . sertraline (ZOLOFT) 50 MG tablet Take 50 mg by mouth daily.    [name] tiZANidine (ZANAFLEX) 4 MG tablet Take 1 tablet (4 mg total) by mouth every 6 (six) hours as needed for muscle spasms. 30 tablet 0  . topiramate (TOPAMAX) 50 MG tablet Take 1 tablet (50 mg total) by mouth daily. At 5:00 PM 30 tablet 0  . traMADol (ULTRAM) 50 MG tablet Take 1 tablet (50 mg total) by mouth every 6 (six) hours as needed. 10 tablet 0  . Vitamin D, Ergocalciferol, (DRISDOL) 1.25 MG (50000 UT) CAPS capsule Take 1 capsule (50,000 Units  total) by mouth every 7 (seven) days. 4 capsule 0   No current facility-administered medications on file prior to visit.     PAST MEDICAL HISTORY: Past Medical History:  Diagnosis Date  . Anxiety   . Chronic lower back pain   . Depression   . Diverticulitis   . Expressive aphasia 11/15/2016  . Fibromyalgia   . GERD (gastroesophageal reflux disease)   . High cholesterol   . History of gastric ulcer   . History of hiatal hernia   . History of kidney stones   . History of thyroid nodule   . Hyperlipidemia   . Hypertension   . IBS (irritable bowel syndrome)   . Intermittent vertigo   . Migraine    "a few times/year now; maybe" (11/15/2016)  . Obesity   . OSA on CPAP    wears CPAP nightly ;study in epic 02-01-2014 (11/15/2016)  . Osteoarthritis    "knees, hands, back, hips" (11/15/2016)  . Pneumonia X 1   hx of  . Psychogenic tremor    intermittant head tremor-(11/15/2016)  . Refusal of blood transfusions as patient is Jehovah's Witness   . RLS (restless legs syndrome)   . Sleep apnea   . Urinary incontinence     PAST SURGICAL HISTORY: Past Surgical History:  Procedure Laterality Date  . ABDOMINAL HYSTERECTOMY  1990  . ANTERIOR CERVICAL DECOMP/DISCECTOMY FUSION  02-10-2004   C4 -- C7  . BACK SURGERY    . CARDIOVASCULAR STRESS TEST  2017   Negative  . CESAREAN SECTION  1977; 1982  . COLONOSCOPY    . CYSTOSCOPY W/ RETROGRADES Right 06/07/2015   Procedure: CYSTOSCOPY WITH RETROGRADE PYELOGRAM;  Surgeon: Ardis Hughs, MD;  Location: [facility];  Service: Urology;  Laterality: Right;  . CYSTOSCOPY WITH URETEROSCOPY AND STENT PLACEMENT Right 06/07/2015   Procedure: RIGHT URETEROSCOPY, LASER LITHOTRIPSY, STONE REMOVAL AND RIGHT URETERAL STENT PLACEMENT;  Surgeon: Ardis Hughs, MD;  Location: [facility];  Service: Urology;  Laterality: Right;  . HERNIA REPAIR    . HOLMIUM LASER APPLICATION N/A 02/04/1442   Procedure: HOLMIUM LASER  APPLICATION;  Surgeon: Ardis Hughs, MD;  Location: [facility];  Service: Urology;  Laterality: N/A;  . LAPAROSCOPIC CHOLECYSTECTOMY  10-07-2000  . LIPOMA EXCISION  01/2017   from neck  . UMBILICAL HERNIA REPAIR  age 61  . UPPER GI ENDOSCOPY      SOCIAL HISTORY: Social History   Tobacco Use  . Smoking status: Former Smoker    Packs/day: 0.50    Years: 25.00    Pack years: 12.50    Types: Cigarettes    Last attempt to quit:  07/24/2008    Years since quitting: 10.3  . Smokeless tobacco: Never Used  Substance Use Topics  . Alcohol use: Yes    Alcohol/week: 0.0 standard drinks    Comment: 11/15/2016 "might have 1 drink/month; if that"  . Drug use: No    FAMILY HISTORY: Family History  Problem Relation Age of Onset  . Colon cancer Maternal Grandfather   . Hypertension Maternal Grandfather   . Lung cancer Maternal Grandfather   . Throat cancer Maternal Grandfather   . Hyperlipidemia Mother   . Hypertension Mother   . Sleep apnea Mother   . Hypertension Father   . Heart disease Father   . Lung cancer Maternal Aunt   . Hypertension Sister   . Hypertension Maternal Grandmother   . CVA Maternal Grandmother   . Hypertension Brother     ROS: Review of Systems  Constitutional: Positive for weight loss.  Gastrointestinal: Negative for nausea and vomiting.  Musculoskeletal:       Negative for muscle weakness  Endo/Heme/Allergies:       Positive for polyphagia  Psychiatric/Behavioral: Positive for depression. Negative for suicidal ideas.    PHYSICAL EXAM: Blood pressure 134/69, pulse 69, temperature 98 F (36.7 C), temperature source Oral, height 5\' 4"  (1.626 m), weight 259 lb (117.5 kg), SpO2 98 %. Body mass index is 44.46 kg/m. Physical Exam  Constitutional: She is oriented to person, place, and time. She appears well-developed and well-nourished.  Cardiovascular: Normal rate.  Pulmonary/Chest: Effort normal.  Musculoskeletal: Normal range of  motion.  Neurological: She is oriented to person, place, and time.  Skin: Skin is warm and dry.  Psychiatric: She has a normal mood and affect. Her behavior is normal. She expresses no homicidal and no suicidal ideation.  Vitals reviewed.   RECENT LABS AND TESTS: BMET    Component Value Date/Time   NA 142 08/12/2018 1143   K 4.7 08/12/2018 1143   CL 102 08/12/2018 1143   CO2 17 (L) 08/12/2018 1143   GLUCOSE 91 08/12/2018 1143   GLUCOSE 103 (H) 07/15/2018 1945   BUN 13 08/12/2018 1143   CREATININE 0.78 08/12/2018 1143   CALCIUM 10.4 (H) 08/12/2018 1143   GFRNONAA 84 08/12/2018 1143   GFRAA 97 08/12/2018 1143   Lab Results  Component Value Date   HGBA1C 5.8 (H) 08/12/2018   HGBA1C 5.5 11/28/2014   HGBA1C 5.2 01/08/2010   Lab Results  Component Value Date   INSULIN 12.3 08/12/2018   CBC    Component Value Date/Time   WBC 9.0 07/15/2018 1945   RBC 4.22 07/15/2018 1945   HGB 12.5 07/15/2018 1945   HCT 38.0 07/15/2018 1945   PLT 375 07/15/2018 1945   MCV 90.0 07/15/2018 1945   MCH 29.6 07/15/2018 1945   MCHC 32.9 07/15/2018 1945   RDW 12.3 07/15/2018 1945   LYMPHSABS 2.5 07/15/2018 1945   MONOABS 0.3 07/15/2018 1945   EOSABS 0.1 07/15/2018 1945   BASOSABS 0.0 07/15/2018 1945   Iron/TIBC/Ferritin/ %Sat No results found for: IRON, TIBC, FERRITIN, IRONPCTSAT Lipid Panel     Component Value Date/Time   CHOL 282 (H) 11/16/2016 0039   TRIG 351 (H) 11/16/2016 0039   HDL 39 (L) 11/16/2016 0039   CHOLHDL 7.2 11/16/2016 0039   VLDL 70 (H) 11/16/2016 0039   LDLCALC 173 (H) 11/16/2016 0039   LDLDIRECT 170.1 01/08/2010 0943   Hepatic Function Panel     Component Value Date/Time   PROT 7.4 08/12/2018 1143   ALBUMIN 4.9  08/12/2018 1143   AST 32 08/12/2018 1143   ALT 35 (H) 08/12/2018 1143   ALKPHOS 109 08/12/2018 1143   BILITOT 0.4 08/12/2018 1143   BILIDIR 0.1 10/09/2012 1653      Component Value Date/Time   TSH 1.690 08/12/2018 1143   TSH 1.586 11/15/2016  1917   TSH 2.18 11/28/2014 0725   TSH 1.056 01/12/2014 0335    Ref. Range 08/12/2018 11:43  Vitamin D, 25-Hydroxy Latest Ref Range: 30.0 - 100.0 ng/mL 17.8 (L)   ASSESSMENT AND PLAN: Vitamin D deficiency  Other depression - with emotional eating  Prediabetes - Plan: metFORMIN (GLUCOPHAGE) 500 MG tablet  Class 3 severe obesity with serious comorbidity and body mass index (BMI) of 40.0 to 44.9 in adult, unspecified obesity type ([place])  PLAN:  Vitamin D Deficiency Laterra was informed that low vitamin D levels contributes to fatigue and are associated with obesity, breast, and colon cancer. She agrees to continue to take prescription Vit D @50 ,000 IU every week and will follow up for routine testing of vitamin D, at least 2-3 times per year. She was informed of the risk of over-replacement of vitamin D and agrees to not increase her dose unless she discusses this with [place] first.  Pre-Diabetes Paula [name] will continue to work on weight loss, exercise, and decreasing simple carbohydrates in her diet to help decrease the risk of diabetes. We dicussed metformin including benefits and risks. She was informed that eating too many simple carbohydrates or too many calories at one sitting increases the likelihood of GI side effects. Paula [name] agreed to start metformin IR 500 mg at 4:00 PM #30 with no refills and follow up with [place] as directed to monitor her progress.  Depression with Emotional Eating Behaviors We discussed behavior modification techniques today to help Paula [name] deal with her emotional eating and depression. She has agreed to continue Topamax 50 mg daily and agreed to follow up as directed. Paula [name] is seeing a therapist.  Obesity Paula [name] is currently in the action stage of change. As such, her goal is to continue with weight loss efforts She has agreed to follow the Category 3 plan Paula [name] has been instructed to work up to a goal of 150 minutes of combined cardio and strengthening exercise per week  for weight loss and overall health benefits. We discussed the following Behavioral Modification Strategies today: increase H2O intake, better snacking choices "100 calorie snacks", increasing lean protein intake, decreasing simple carbohydrates, increasing vegetables, emotional eating strategies and ways to avoid night time snacking  Paula [name] has agreed to follow up with our clinic in 2 weeks. She was informed of the importance of frequent follow up visits to maximize her success with intensive lifestyle modifications for her multiple health conditions.   OBESITY BEHAVIORAL INTERVENTION VISIT  Today's visit was # 7   Starting weight: 255 lbs Starting date: 08/11/2018 Today's weight : 249 lbs Today's date: 11/10/2018 Total lbs lost to date: 6 At least 15 minutes were spent on discussing the following behavioral intervention visit.   ASK: We discussed the diagnosis of obesity with Paula [name] today and Paula [name] agreed to give [place] permission to discuss obesity behavioral modification therapy today.  ASSESS: Paula [name] has the diagnosis of obesity and her BMI today is 44.44 Paula [name] is in the action stage of change   ADVISE: Paula [name] was educated on the multiple health risks of obesity as well as the benefit of weight loss to improve her health. She was advised of the need for  long term treatment and the importance of lifestyle modifications to improve her current health and to decrease her risk of future health problems.  AGREE: Multiple dietary modification options and treatment options were discussed and  Paula [name] agreed to follow the recommendations documented in the above note.  ARRANGE: Paula [name] was educated on the importance of frequent visits to treat obesity as outlined per CMS and USPSTF guidelines and agreed to schedule her next follow up appointment today.  Corey Skains, am acting as Location manager for [employer]. Owens Shark, DO  I have reviewed the above documentation for accuracy and  completeness, and I agree with the above. -Jearld Lesch, DO

## 2018-11-16 ENCOUNTER — Ambulatory Visit (HOSPITAL_COMMUNITY): Payer: PPO | Admitting: Licensed Clinical Social Worker

## 2018-11-23 ENCOUNTER — Ambulatory Visit (INDEPENDENT_AMBULATORY_CARE_PROVIDER_SITE_OTHER): Payer: Self-pay | Admitting: Physician Assistant

## 2018-11-23 ENCOUNTER — Ambulatory Visit (HOSPITAL_COMMUNITY): Payer: PPO | Admitting: Licensed Clinical Social Worker

## 2018-11-26 ENCOUNTER — Other Ambulatory Visit: Payer: Self-pay | Admitting: Gastroenterology

## 2018-11-26 DIAGNOSIS — R197 Diarrhea, unspecified: Secondary | ICD-10-CM | POA: Diagnosis not present

## 2018-11-26 DIAGNOSIS — R10826 Epigastric rebound abdominal tenderness: Secondary | ICD-10-CM | POA: Diagnosis not present

## 2018-11-27 DIAGNOSIS — R10826 Epigastric rebound abdominal tenderness: Secondary | ICD-10-CM | POA: Diagnosis not present

## 2018-11-30 ENCOUNTER — Ambulatory Visit (INDEPENDENT_AMBULATORY_CARE_PROVIDER_SITE_OTHER): Payer: PPO | Admitting: Licensed Clinical Social Worker

## 2018-11-30 ENCOUNTER — Telehealth: Payer: Self-pay | Admitting: Nurse Practitioner

## 2018-11-30 DIAGNOSIS — F330 Major depressive disorder, recurrent, mild: Secondary | ICD-10-CM

## 2018-11-30 NOTE — Telephone Encounter (Signed)
Phone call to patient to review instructions for 13 hr prep for CT w/ contrast on 12/31 at 1400. Prescription called into [place]. Pt aware and verbalized understanding of instructions. Prescription: 0100- 50mg  Prednisone 0700- 50mg  Prednisone 1300- 50mg  Prednisone and 50mg  Benadryl

## 2018-11-30 NOTE — Progress Notes (Signed)
   THERAPIST PROGRESS NOTE  Session Time: 2pm-3pm  Participation Level: Active  Behavioral Response: Well GroomedAlertDysphoric and Irritable  Type of Therapy: Group Therapy  Treatment Goals addressed: Coping  Interventions: CBT, Motivational Interviewing and Supportive  Summary: Paula [name] is a 57 y.o. female who presents with symptoms of depression and irritability. Client continues to verbalize frustrations with the behavior of her mother and husband. Client reports she can identify some things are out of her control but can only sometimes 'talk herself down.' Client reports several needs not being met and also identifies that some frustrations could be linked with low feelings of self worth. Client does not identify any skills used other than walking away. Client verbalized understanding of replacement behavior for emotional eating, or swapping for healthy snacks however states that does not work. Client was encouraged to track thoughts and feelings related to snacking on unhealthy foods when she is not hungry.   Suicidal/Homicidal: Nowithout intent/plan  Therapist Response: The purpose of this group is to utilize CBT skills in a group setting to increase use of healthy coping skills and decrease intensity of mental health symptoms. Clinician presented topic of identifying needs and healthy ways to get needs met. Clinician and group discussed types of physical, emotional, cognitive, and social needs. Clinician and group member processed differences when need are or are not met and brainstormed ways to address. Clinician and group discussed setting intensions for the year, rather than specific goals in order to help direct actions and decision making. Clinician utilize active listening skills including clarifying questions and summarizing to validate client feelings. Clinician assessed for SI/HI/pscyhosis and overall level of functioning.   Plan: Return again in 1  weeks.  Diagnosis: Axis I: Depressive Disorder NOS     Olegario Messier, LCSW 11/30/2018

## 2018-12-01 ENCOUNTER — Ambulatory Visit
Admission: RE | Admit: 2018-12-01 | Discharge: 2018-12-01 | Disposition: A | Discharge status: Home / Self Care | Payer: PPO | Source: Ambulatory Visit | Attending: Gastroenterology | Admitting: Gastroenterology

## 2018-12-01 DIAGNOSIS — K760 Fatty (change of) liver, not elsewhere classified: Secondary | ICD-10-CM | POA: Diagnosis not present

## 2018-12-01 DIAGNOSIS — R10826 Epigastric rebound abdominal tenderness: Secondary | ICD-10-CM

## 2018-12-01 DIAGNOSIS — R197 Diarrhea, unspecified: Secondary | ICD-10-CM

## 2018-12-01 MED ORDER — IOPAMIDOL (ISOVUE-300) INJECTION 61%
100.0000 mL | Freq: Once | INTRAVENOUS | Status: AC | PRN
  Administered 2018-12-01: 100 mL via INTRAVENOUS

## 2018-12-03 DIAGNOSIS — K219 Gastro-esophageal reflux disease without esophagitis: Secondary | ICD-10-CM | POA: Diagnosis not present

## 2018-12-03 DIAGNOSIS — Z9049 Acquired absence of other specified parts of digestive tract: Secondary | ICD-10-CM | POA: Diagnosis not present

## 2018-12-03 DIAGNOSIS — R1013 Epigastric pain: Secondary | ICD-10-CM | POA: Diagnosis not present

## 2018-12-03 DIAGNOSIS — K580 Irritable bowel syndrome with diarrhea: Secondary | ICD-10-CM | POA: Diagnosis not present

## 2018-12-07 ENCOUNTER — Ambulatory Visit (INDEPENDENT_AMBULATORY_CARE_PROVIDER_SITE_OTHER): Payer: Self-pay | Admitting: Bariatrics

## 2018-12-07 ENCOUNTER — Ambulatory Visit (INDEPENDENT_AMBULATORY_CARE_PROVIDER_SITE_OTHER): Payer: PPO | Admitting: Licensed Clinical Social Worker

## 2018-12-07 DIAGNOSIS — F330 Major depressive disorder, recurrent, mild: Secondary | ICD-10-CM | POA: Diagnosis not present

## 2018-12-09 NOTE — Progress Notes (Signed)
   THERAPIST PROGRESS NOTE  Session Time: 2pm-3pm  Participation Level: Active  Behavioral Response: Well GroomedAlertIrritable  Type of Therapy: Individual Therapy  Treatment Goals addressed: Coping  Interventions: CBT, Motivational Interviewing and Supportive  Summary: Paula [name] is a 58 y.o. female who presents with mild depression and irritability. Client reports frustration about her having to change behaviors when others do not. Client states she does not feel understood by anyone and continues to use 'should' statements including how others should already know how she is feeling and what she is needing without her telling others. Client decided to discontinue therapy at this time, stating she must accept that she is unique and be okay that others will never understand her.   Suicidal/Homicidal: Nowithout intent/plan  Therapist Response: Clinician met with client assessing for SI/HI/psychosis/substance abuse. Clinician and client discussed treatment plan goals and progress in therapy. Clinician utilized motivational interviewing and socratic questioning to process clients verbalization of desire to change and willfulness with implementing skills 'knowing' others would be change. Clinician reviewed with client distorted thoughts and how these effect behaviors. Clinician encouraged client to identify needs and willingness and return if needed.   Plan: Return again as needed.  Diagnosis: Axis I: Major Depressive Disorder, mild     Olegario Messier, LCSW 12/07/2018

## 2018-12-14 ENCOUNTER — Ambulatory Visit (HOSPITAL_COMMUNITY): Payer: PPO | Admitting: Licensed Clinical Social Worker

## 2018-12-17 DIAGNOSIS — J209 Acute bronchitis, unspecified: Secondary | ICD-10-CM | POA: Diagnosis not present

## 2018-12-21 ENCOUNTER — Ambulatory Visit (HOSPITAL_COMMUNITY): Payer: PPO | Admitting: Licensed Clinical Social Worker

## 2018-12-28 ENCOUNTER — Ambulatory Visit (INDEPENDENT_AMBULATORY_CARE_PROVIDER_SITE_OTHER): Payer: Self-pay | Admitting: Bariatrics

## 2018-12-28 ENCOUNTER — Ambulatory Visit (HOSPITAL_COMMUNITY): Payer: PPO | Admitting: Licensed Clinical Social Worker

## 2018-12-28 ENCOUNTER — Encounter (INDEPENDENT_AMBULATORY_CARE_PROVIDER_SITE_OTHER): Payer: Self-pay

## 2018-12-30 DIAGNOSIS — Z9049 Acquired absence of other specified parts of digestive tract: Secondary | ICD-10-CM | POA: Diagnosis not present

## 2018-12-30 DIAGNOSIS — R1013 Epigastric pain: Secondary | ICD-10-CM | POA: Diagnosis not present

## 2018-12-30 DIAGNOSIS — K580 Irritable bowel syndrome with diarrhea: Secondary | ICD-10-CM | POA: Diagnosis not present

## 2018-12-30 DIAGNOSIS — K219 Gastro-esophageal reflux disease without esophagitis: Secondary | ICD-10-CM | POA: Diagnosis not present

## 2019-01-04 DIAGNOSIS — H40033 Anatomical narrow angle, bilateral: Secondary | ICD-10-CM | POA: Diagnosis not present

## 2019-01-04 DIAGNOSIS — H1851 Endothelial corneal dystrophy: Secondary | ICD-10-CM | POA: Diagnosis not present

## 2019-02-18 DIAGNOSIS — K580 Irritable bowel syndrome with diarrhea: Secondary | ICD-10-CM | POA: Diagnosis not present

## 2019-02-18 DIAGNOSIS — K219 Gastro-esophageal reflux disease without esophagitis: Secondary | ICD-10-CM | POA: Diagnosis not present

## 2019-02-18 DIAGNOSIS — R1013 Epigastric pain: Secondary | ICD-10-CM | POA: Diagnosis not present

## 2019-02-18 DIAGNOSIS — Z9049 Acquired absence of other specified parts of digestive tract: Secondary | ICD-10-CM | POA: Diagnosis not present

## 2019-03-01 DIAGNOSIS — Z9109 Other allergy status, other than to drugs and biological substances: Secondary | ICD-10-CM | POA: Diagnosis not present

## 2019-03-01 DIAGNOSIS — E782 Mixed hyperlipidemia: Secondary | ICD-10-CM | POA: Diagnosis not present

## 2019-03-01 DIAGNOSIS — J4520 Mild intermittent asthma, uncomplicated: Secondary | ICD-10-CM | POA: Diagnosis not present

## 2019-03-01 DIAGNOSIS — I10 Essential (primary) hypertension: Secondary | ICD-10-CM | POA: Diagnosis not present

## 2019-03-01 DIAGNOSIS — R7303 Prediabetes: Secondary | ICD-10-CM | POA: Diagnosis not present

## 2019-03-01 DIAGNOSIS — F5101 Primary insomnia: Secondary | ICD-10-CM | POA: Diagnosis not present

## 2019-03-01 DIAGNOSIS — K219 Gastro-esophageal reflux disease without esophagitis: Secondary | ICD-10-CM | POA: Diagnosis not present

## 2019-03-22 DIAGNOSIS — R140 Abdominal distension (gaseous): Secondary | ICD-10-CM | POA: Diagnosis not present

## 2019-03-22 DIAGNOSIS — R1013 Epigastric pain: Secondary | ICD-10-CM | POA: Diagnosis not present

## 2019-03-22 DIAGNOSIS — K580 Irritable bowel syndrome with diarrhea: Secondary | ICD-10-CM | POA: Diagnosis not present

## 2019-03-22 DIAGNOSIS — K219 Gastro-esophageal reflux disease without esophagitis: Secondary | ICD-10-CM | POA: Diagnosis not present

## 2019-04-01 DIAGNOSIS — G4733 Obstructive sleep apnea (adult) (pediatric): Secondary | ICD-10-CM | POA: Diagnosis not present

## 2019-04-09 DIAGNOSIS — S39012A Strain of muscle, fascia and tendon of lower back, initial encounter: Secondary | ICD-10-CM | POA: Diagnosis not present

## 2019-04-09 DIAGNOSIS — R251 Tremor, unspecified: Secondary | ICD-10-CM | POA: Diagnosis not present

## 2019-04-23 DIAGNOSIS — M542 Cervicalgia: Secondary | ICD-10-CM | POA: Diagnosis not present

## 2019-04-23 DIAGNOSIS — M791 Myalgia, unspecified site: Secondary | ICD-10-CM | POA: Diagnosis not present

## 2019-04-23 DIAGNOSIS — M549 Dorsalgia, unspecified: Secondary | ICD-10-CM | POA: Diagnosis not present

## 2019-04-28 ENCOUNTER — Other Ambulatory Visit: Payer: Self-pay | Admitting: Family Medicine

## 2019-04-28 DIAGNOSIS — Z1231 Encounter for screening mammogram for malignant neoplasm of breast: Secondary | ICD-10-CM

## 2019-05-19 DIAGNOSIS — G4733 Obstructive sleep apnea (adult) (pediatric): Secondary | ICD-10-CM | POA: Diagnosis not present

## 2019-05-29 ENCOUNTER — Other Ambulatory Visit: Payer: Self-pay

## 2019-05-29 ENCOUNTER — Ambulatory Visit
Admission: RE | Admit: 2019-05-29 | Discharge: 2019-05-29 | Disposition: A | Discharge status: Home / Self Care | Payer: PPO | Source: Ambulatory Visit | Attending: Family Medicine | Admitting: Family Medicine

## 2019-05-29 DIAGNOSIS — Z1231 Encounter for screening mammogram for malignant neoplasm of breast: Secondary | ICD-10-CM

## 2019-06-02 ENCOUNTER — Encounter (HOSPITAL_COMMUNITY): Payer: Self-pay | Admitting: Emergency Medicine

## 2019-06-02 ENCOUNTER — Emergency Department (HOSPITAL_COMMUNITY)
Admission: EM | Admit: 2019-06-02 | Discharge: 2019-06-02 | Disposition: A | Discharge status: Home / Self Care | Payer: PPO | Attending: Emergency Medicine | Admitting: Emergency Medicine

## 2019-06-02 ENCOUNTER — Other Ambulatory Visit: Payer: Self-pay

## 2019-06-02 ENCOUNTER — Emergency Department (HOSPITAL_COMMUNITY): Payer: PPO

## 2019-06-02 DIAGNOSIS — R0602 Shortness of breath: Secondary | ICD-10-CM | POA: Diagnosis not present

## 2019-06-02 DIAGNOSIS — F419 Anxiety disorder, unspecified: Secondary | ICD-10-CM | POA: Diagnosis not present

## 2019-06-02 DIAGNOSIS — Z79899 Other long term (current) drug therapy: Secondary | ICD-10-CM | POA: Insufficient documentation

## 2019-06-02 DIAGNOSIS — F410 Panic disorder [episodic paroxysmal anxiety] without agoraphobia: Secondary | ICD-10-CM | POA: Diagnosis not present

## 2019-06-02 DIAGNOSIS — Z7984 Long term (current) use of oral hypoglycemic drugs: Secondary | ICD-10-CM | POA: Diagnosis not present

## 2019-06-02 DIAGNOSIS — Z87891 Personal history of nicotine dependence: Secondary | ICD-10-CM | POA: Diagnosis not present

## 2019-06-02 DIAGNOSIS — R221 Localized swelling, mass and lump, neck: Secondary | ICD-10-CM | POA: Diagnosis not present

## 2019-06-02 MED ORDER — HYDROXYZINE HCL 25 MG PO TABS
25.0000 mg | ORAL_TABLET | Freq: Once | ORAL | Status: AC
  Administered 2019-06-02: 25 mg via ORAL
  Filled 2019-06-02: qty 1

## 2019-06-02 MED ORDER — LORAZEPAM 1 MG PO TABS
1.0000 mg | ORAL_TABLET | Freq: Once | ORAL | Status: AC
  Administered 2019-06-02: 1 mg via ORAL
  Filled 2019-06-02: qty 1

## 2019-06-02 MED ORDER — HYDROXYZINE HCL 25 MG PO TABS: 25.0000 mg | ORAL_TABLET | Freq: Four times a day (QID) | ORAL | 0 refills | Status: DC

## 2019-06-02 NOTE — Discharge Instructions (Signed)
Your evaluation today has been reassuring I think your symptoms are likely related to anxiety and panic attack.  At home you can use hydroxyzine 3 times daily as needed for anxiety, please follow-up with your primary care doctor to discuss more long-term management of your anxiety.  Return to the emergency department if you have worsening shortness of breath or difficulty breathing or swallowing, noticed facial swelling, rash or any other  new or worsening symptoms.

## 2019-06-02 NOTE — ED Notes (Signed)
Bed: WTR5 Expected date:  Expected time:  Means of arrival:  Comments: 

## 2019-06-02 NOTE — ED Provider Notes (Signed)
[facility] DEPT Provider Note   CSN: [id number] Arrival date & time: 06/02/19  0901    History   Chief Complaint Chief Complaint  Patient presents with  . Anxiety    HPI Paula [name] is a 58 y.o. female.     Paula [name] is a 58 y.o. female with a history of anxiety, depression, hypertension, hyperlipidemia, IBS, migraines, sleep apnea fibromyalgia, who presents to the emergency department for evaluation of a sensation that her throat is closing and she is having hard time taking a deep breath.  This started after she got very anxious and worked up this morning, about 30 minutes prior to arrival.  She reports that she has a history of panic attacks that feels similar although throat heaviness is not always present.  She has not had any facial swelling, swelling of the lips or tongue.  No rash, chest pain, wheezing, no nausea or vomiting.  She denies any new medications, new foods or new exposures.  No history of anaphylaxis.  She reports she continues to feel very anxious.  She has been trying to slow down and control her breathing but has been unable to get her symptoms to resolve on her own.  She reports she used to take sertraline but has never had an acute medication to take for panic attacks, stopped taking sertraline because she felt like it was not working.  No medications prior to arrival.     Past Medical History:  Diagnosis Date  . Anxiety   . Chronic lower back pain   . Depression   . Diverticulitis   . Expressive aphasia 11/15/2016  . Fibromyalgia   . GERD (gastroesophageal reflux disease)   . High cholesterol   . History of gastric ulcer   . History of hiatal hernia   . History of kidney stones   . History of thyroid nodule   . Hyperlipidemia   . Hypertension   . IBS (irritable bowel syndrome)   . Intermittent vertigo   . Migraine    "a few times/year now; maybe" (11/15/2016)  . Obesity   . OSA on CPAP    wears CPAP  nightly ;study in epic 02-01-2014 (11/15/2016)  . Osteoarthritis    "knees, hands, back, hips" (11/15/2016)  . Pneumonia X 1   hx of  . Psychogenic tremor    intermittant head tremor-(11/15/2016)  . Refusal of blood transfusions as patient is Jehovah's Witness   . RLS (restless legs syndrome)   . Sleep apnea   . Urinary incontinence     Patient Active Problem List   Diagnosis Date Noted  . Atypical chest pain 08/13/2018  . Depression, major 03/17/2018  . Chronic pain syndrome 03/17/2018  . Expressive aphasia 11/15/2016  . Cough 11/08/2015  . GERD (gastroesophageal reflux disease) 11/08/2015  . B12 deficiency 12/07/2014  . Wart viral 11/07/2014  . Coarse tremors 02/09/2014  . OSA on CPAP 01/28/2014  . Psychosomatic disorder 01/14/2014  . Dyspnea 01/12/2014  . Tremor of face and hands 01/12/2014  . Breast pain in female 02/09/2013  . Elevated WBC count 10/12/2012  . Abdominal pain, other specified site 10/09/2012  . Abdominal bloating 10/09/2012  . Ovarian cystic mass 05/25/2012  . Shoulder pain, right 03/11/2012  . Right arm pain 12/30/2011  . Acute sinusitis 07/30/2010  . ACNE, ROSACEA 07/17/2010  . LEG PAIN 07/17/2010  . NECK PAIN 05/10/2010  . ABDOMINAL PAIN, EPIGASTRIC 05/10/2010  . BURSITIS, LEFT HIP 05/01/2010  .  PLANTAR FASCIITIS, BILATERAL 05/01/2010  . HYPERGLYCEMIA 01/05/2010  . PRURITUS 11/27/2009  . TOBACCO USE, QUIT 11/27/2009  . ANXIETY 11/22/2008  . FATIGUE 11/22/2008  . Essential hypertension 10/07/2008  . Insomnia, unspecified 08/30/2008  . Obesity 08/19/2008  . NAUSEA ALONE 05/02/2008  . VERTIGO 01/19/2008  . DIARRHEA 01/19/2008  . THYROID NODULE 06/23/2007  . HYPERLIPIDEMIA 06/23/2007  . Situational depression 06/23/2007  . ADD 06/23/2007  . TMJ SYNDROME 06/23/2007  . GERD 06/23/2007  . Irritable bowel syndrome 06/23/2007  . Headache 06/23/2007    Past Surgical History:  Procedure Laterality Date  . ABDOMINAL HYSTERECTOMY  1990  .  ANTERIOR CERVICAL DECOMP/DISCECTOMY FUSION  02-10-2004   C4 -- C7  . BACK SURGERY    . CARDIOVASCULAR STRESS TEST  2017   Negative  . CESAREAN SECTION  1977; 1982  . COLONOSCOPY    . CYSTOSCOPY W/ RETROGRADES Right 06/07/2015   Procedure: CYSTOSCOPY WITH RETROGRADE PYELOGRAM;  Surgeon: Ardis Hughs, MD;  Location: [facility];  Service: Urology;  Laterality: Right;  . CYSTOSCOPY WITH URETEROSCOPY AND STENT PLACEMENT Right 06/07/2015   Procedure: RIGHT URETEROSCOPY, LASER LITHOTRIPSY, STONE REMOVAL AND RIGHT URETERAL STENT PLACEMENT;  Surgeon: Ardis Hughs, MD;  Location: [facility];  Service: Urology;  Laterality: Right;  . HERNIA REPAIR    . HOLMIUM LASER APPLICATION N/A 08/08/2835   Procedure: HOLMIUM LASER APPLICATION;  Surgeon: Ardis Hughs, MD;  Location: [facility];  Service: Urology;  Laterality: N/A;  . LAPAROSCOPIC CHOLECYSTECTOMY  10-07-2000  . LIPOMA EXCISION  01/2017   from neck  . UMBILICAL HERNIA REPAIR  age 59  . UPPER GI ENDOSCOPY       OB History    Gravida  3   Para  2   Term  2   Preterm      AB  1   Living  2     SAB      TAB  1   Ectopic      Multiple      Live Births               Home Medications    Prior to Admission medications   Medication Sig Start Date End Date Taking? Authorizing Provider  atorvastatin (LIPITOR) 20 MG tablet Take 20 mg by mouth daily. 05/01/18   [provider]  benzonatate (TESSALON) 200 MG capsule Take 1 capsule (200 mg total) by mouth 3 (three) times daily as needed for cough. 09/22/18 09/22/19  Parrett, Fonnie Mu, NP  cyclobenzaprine (FLEXERIL) 10 MG tablet Take 10 mg by mouth at bedtime as needed for muscle spasms.    [provider]  dicyclomine (BENTYL) 10 MG capsule Take 20 mg by mouth 3 (three) times daily as needed for cramping. 07/09/18   [provider]  hydrOXYzine (ATARAX/VISTARIL) 25 MG tablet Take 1 tablet (25 mg  total) by mouth every 6 (six) hours. 06/02/19   Jacqlyn Larsen, PA-C  metFORMIN (GLUCOPHAGE) 500 MG tablet Take 1 tablet (500 mg total) by mouth daily with breakfast. 11/10/18   Jearld Lesch A, DO  pantoprazole (PROTONIX) 40 MG tablet Take 40 mg by mouth 2 (two) times daily before a meal.    [provider]  sertraline (ZOLOFT) 50 MG tablet Take 50 mg by mouth daily.    [provider]  tiZANidine (ZANAFLEX) 4 MG tablet Take 1 tablet (4 mg total) by mouth every 6 (six) hours as needed for  muscle spasms. 10/01/18   Kathrene Alu, MD  topiramate (TOPAMAX) 50 MG tablet Take 1 tablet (50 mg total) by mouth daily. At 5:00 PM 10/22/18 11/21/18  Jearld Lesch A, DO  traMADol (ULTRAM) 50 MG tablet Take 1 tablet (50 mg total) by mouth every 6 (six) hours as needed. 07/15/18   Carlisle Cater, PA-C  Vitamin D, Ergocalciferol, (DRISDOL) 1.25 MG (50000 UT) CAPS capsule Take 1 capsule (50,000 Units total) by mouth every 7 (seven) days. 10/22/18   Georgia Lopes, DO    Family History Family History  Problem Relation Age of Onset  . Colon cancer Maternal Grandfather   . Hypertension Maternal Grandfather   . Lung cancer Maternal Grandfather   . Throat cancer Maternal Grandfather   . Hyperlipidemia Mother   . Hypertension Mother   . Sleep apnea Mother   . Hypertension Father   . Heart disease Father   . Lung cancer Maternal Aunt   . Hypertension Sister   . Hypertension Maternal Grandmother   . CVA Maternal Grandmother   . Hypertension Brother     Social History Social History   Tobacco Use  . Smoking status: Former Smoker    Packs/day: 0.50    Years: 25.00    Pack years: 12.50    Types: Cigarettes    Quit date: 07/24/2008    Years since quitting: 10.8  . Smokeless tobacco: Never Used  Substance Use Topics  . Alcohol use: Yes    Alcohol/week: 0.0 standard drinks    Comment: 11/15/2016 "might have 1 drink/month; if that"  . Drug use: No     Allergies   Detrol  [tolterodine], Gabapentin, Aspirin, Contrast media [iodinated diagnostic agents], Hydrocodone-acetaminophen, Imitrex [sumatriptan], Oxycodone, and Avelox [moxifloxacin]   Review of Systems Review of Systems  Constitutional: Negative for chills and fever.  HENT: Negative.  Negative for sore throat and trouble swallowing.   Respiratory: Positive for shortness of breath. Negative for cough and wheezing.   Cardiovascular: Negative for chest pain.  Gastrointestinal: Negative for abdominal pain, nausea and vomiting.  Genitourinary: Negative for dysuria and frequency.  Musculoskeletal: Negative for arthralgias and myalgias.  Skin: Negative for color change and rash.  Psychiatric/Behavioral: Negative for dysphoric mood. The patient is nervous/anxious.   All other systems reviewed and are negative.    Physical Exam Updated Vital Signs BP (!) 153/96 (BP Location: Left Arm)   Pulse 67   Temp 98.2 F (36.8 C) (Oral)   Resp (!) 24   Ht 5\' 5"  (1.651 m)   Wt 116.1 kg   SpO2 100%   BMI 42.60 kg/m   Physical Exam Vitals signs and nursing note reviewed.  Constitutional:      General: She is not in acute distress.    Appearance: Normal appearance. She is well-developed and normal weight. She is not diaphoretic.     Comments: Patient appears very anxious, but is in no acute distress.  HENT:     Head: Normocephalic and atraumatic.     Mouth/Throat:     Mouth: Mucous membranes are moist.     Pharynx: Oropharynx is clear.     Comments: Posterior oropharynx clear and mucous membranes moist, there is no erythema, no edema or tonsillar exudates, uvula midline, normal phonation, no trismus, tolerating secretions without difficulty.  Eyes:     General:        Right eye: No discharge.        Left eye: No discharge.     Pupils:  Pupils are equal, round, and reactive to light.  Neck:     Musculoskeletal: Neck supple.     Comments: No stridor, slight bilateral lymphadenopathy noted but no palpable  masses, patient is not tripoding, breathing comfortably and tolerating secretions. Cardiovascular:     Rate and Rhythm: Normal rate and regular rhythm.     Heart sounds: Normal heart sounds.  Pulmonary:     Effort: Pulmonary effort is normal. No respiratory distress.     Breath sounds: Normal breath sounds. No wheezing or rales.     Comments: Patient mildly tachypneic, appears anxious, but respirations are equal and she is breathing well on room air, patient able to calm herself and slow respirations down. Patient able to speak in full sentences, lungs clear to auscultation bilaterally Abdominal:     General: Bowel sounds are normal. There is no distension.     Palpations: Abdomen is soft. There is no mass.     Tenderness: There is no abdominal tenderness. There is no guarding.  Musculoskeletal:        General: No deformity.  Skin:    General: Skin is warm and dry.     Capillary Refill: Capillary refill takes less than 2 seconds.  Neurological:     Mental Status: She is alert.     Coordination: Coordination normal.     Comments: Speech is clear, able to follow commands CN III-XII intact Normal strength in upper and lower extremities bilaterally including dorsiflexion and plantar flexion, strong and equal grip strength Sensation normal to light and sharp touch Moves extremities without ataxia, coordination intact   Psychiatric:        Mood and Affect: Mood is anxious.        Behavior: Behavior normal.      ED Treatments / Results  Labs (all labs ordered are listed, but only abnormal results are displayed) Labs Reviewed - No data to display  EKG None  Radiology Dg Neck Soft Tissue  Result Date: 06/02/2019 CLINICAL DATA:  Throat fullness. EXAM: NECK SOFT TISSUES - 1+ VIEW COMPARISON:  Cervical spine radiograph 05/08/2018 FINDINGS: ACDF C4 through C7. Corpectomy with metal strut graft C4 through C7. Anterior plate in unchanged position. Prevertebral soft tissues normal. Airway  normal.  Epiglottis normal.  No foreign body in the pharynx. IMPRESSION: ACDF C4 through C7 Normal soft tissues. Electronically Signed   By: Franchot Gallo M.D.   On: 06/02/2019 11:42    Procedures Procedures (including critical care time)  Medications Ordered in ED Medications  LORazepam (ATIVAN) tablet 1 mg (1 mg Oral Given 06/02/19 1003)  hydrOXYzine (ATARAX/VISTARIL) tablet 25 mg (25 mg Oral Given 06/02/19 1053)     Initial Impression / Assessment and Plan / ED Course  I have reviewed the triage vital signs and the nursing notes.  Pertinent labs & imaging results that were available during my care of the patient were reviewed by me and considered in my medical decision making (see chart for details).  Patient presents with sensation of throat closing and shortness of breath which started after she got very worked up and upset, she has a history of severe anxiety and panic attacks and reports similar symptoms in the past with her panic attacks.  Does report that heaviness in throat sensation seems to be different.  She has no stridor on exam is not tripoding and is tolerating secretions without difficulty, posterior oropharynx is clear and there is no angioedema of the face, lips or tongue.  Suspect this  is likely related to patient's anxiety.  Will give Ativan and monitor.  On reevaluation patient reports improvement in her breathing but continues to feel heaviness in her throat, repeat exam continues to be reassuring, I suspect this is still related to patient's anxiety, will give dose of hydroxyzine and get soft tissue neck x-ray.  Case discussed with Dr. Venora Maples who saw and evaluated patient and agrees with plan.  She reports she is feeling better after hydroxyzine, discussed reassuring x-ray with the patient, she is breathing comfortably and in no distress, vitals remain normal.  Patient reports she is ready to go home.  Will discharge patient home with prescription for hydroxyzine I have  also encouraged her to follow-up with her PCP to discuss more long-term medications to help manage her anxiety.  Return precautions discussed and she expresses understanding and agreement with plan.  Discharged home in good condition.  Final Clinical Impressions(s) / ED Diagnoses   Final diagnoses:  Panic attack  Anxiety  Shortness of breath    ED Discharge Orders         Ordered    hydrOXYzine (ATARAX/VISTARIL) 25 MG tablet  Every 6 hours     06/02/19 1306           Janet Berlin 06/02/19 1311    Jola Schmidt, MD 06/02/19 1528

## 2019-06-02 NOTE — ED Notes (Signed)
Patient transported to radiology

## 2019-06-02 NOTE — ED Triage Notes (Signed)
Feels like throat is closing b/o anxiety.  H/o panic attacks. Did not take any medication for it today.  Was at dentist with her mother and it got progressively worse.  Breathing rapidly.

## 2019-06-08 DIAGNOSIS — F419 Anxiety disorder, unspecified: Secondary | ICD-10-CM | POA: Diagnosis not present

## 2019-06-08 DIAGNOSIS — R0989 Other specified symptoms and signs involving the circulatory and respiratory systems: Secondary | ICD-10-CM | POA: Diagnosis not present

## 2019-09-01 DIAGNOSIS — N951 Menopausal and female climacteric states: Secondary | ICD-10-CM | POA: Diagnosis not present

## 2019-09-01 DIAGNOSIS — B009 Herpesviral infection, unspecified: Secondary | ICD-10-CM | POA: Diagnosis not present

## 2019-09-01 DIAGNOSIS — Z6841 Body Mass Index (BMI) 40.0 and over, adult: Secondary | ICD-10-CM | POA: Diagnosis not present

## 2019-09-01 DIAGNOSIS — N3281 Overactive bladder: Secondary | ICD-10-CM | POA: Diagnosis not present

## 2019-10-07 DIAGNOSIS — M722 Plantar fascial fibromatosis: Secondary | ICD-10-CM | POA: Diagnosis not present

## 2019-10-14 DIAGNOSIS — E782 Mixed hyperlipidemia: Secondary | ICD-10-CM | POA: Diagnosis not present

## 2019-10-14 DIAGNOSIS — F411 Generalized anxiety disorder: Secondary | ICD-10-CM | POA: Diagnosis not present

## 2019-10-14 DIAGNOSIS — R7303 Prediabetes: Secondary | ICD-10-CM | POA: Diagnosis not present

## 2019-10-14 DIAGNOSIS — K219 Gastro-esophageal reflux disease without esophagitis: Secondary | ICD-10-CM | POA: Diagnosis not present

## 2019-11-09 ENCOUNTER — Ambulatory Visit: Payer: PPO | Admitting: Podiatry

## 2019-11-11 ENCOUNTER — Encounter: Payer: Self-pay | Admitting: Podiatry

## 2019-11-11 ENCOUNTER — Ambulatory Visit (INDEPENDENT_AMBULATORY_CARE_PROVIDER_SITE_OTHER): Payer: PPO | Admitting: Podiatry

## 2019-11-11 ENCOUNTER — Ambulatory Visit (INDEPENDENT_AMBULATORY_CARE_PROVIDER_SITE_OTHER): Payer: PPO

## 2019-11-11 ENCOUNTER — Other Ambulatory Visit: Payer: Self-pay

## 2019-11-11 VITALS — BP 154/91 | HR 66

## 2019-11-11 DIAGNOSIS — M722 Plantar fascial fibromatosis: Secondary | ICD-10-CM | POA: Diagnosis not present

## 2019-11-11 DIAGNOSIS — B351 Tinea unguium: Secondary | ICD-10-CM | POA: Diagnosis not present

## 2019-11-11 NOTE — Patient Instructions (Signed)

## 2019-11-15 DIAGNOSIS — G4733 Obstructive sleep apnea (adult) (pediatric): Secondary | ICD-10-CM | POA: Diagnosis not present

## 2019-11-15 NOTE — Progress Notes (Signed)
Subjective:   Patient ID: Paula [name], female   DOB: 58 y.o.   MRN: [id number]   HPI Patient presents stating she has been getting heel pain in both feet over a month duration has had history of this and also is concerned about discoloration of her big toenails.  Patient has dry skin in general and does not smoke and likes to be active if possible   Review of Systems  All other systems reviewed and are negative.       Objective:  Physical Exam Vitals and nursing note reviewed.  Constitutional:      Appearance: She is well-developed.  Pulmonary:     Effort: Pulmonary effort is normal.  Musculoskeletal:        General: Normal range of motion.  Skin:    General: Skin is warm.  Neurological:     Mental Status: She is alert.     Neurovascular status intact muscle strength was found to be adequate range of motion within normal limits.  Patient is found to have exquisite discomfort plantar aspect heel region bilateral with inflammation fluid of the medial band and mild thickness of the hallux nails bilateral with discoloration of the beds for more local and acute timeframe     Assessment:  Acute plantar fasciitis bilateral with nail disease bilateral     Plan:  H&P conditions reviewed and at this point did sterile prep and injected the fascia bilateral 3 mg Kenalog 5 mg Xylocaine and applied fascial brace with instructions on usage.  Patient will not have the nails treated but I did discuss it with her and I do not recommend current treatment  X-rays indicate there is spur formation bilateral no indications of stress fracture arthritis

## 2019-11-29 ENCOUNTER — Other Ambulatory Visit: Payer: Self-pay

## 2019-11-29 ENCOUNTER — Ambulatory Visit (INDEPENDENT_AMBULATORY_CARE_PROVIDER_SITE_OTHER): Payer: PPO | Admitting: Podiatry

## 2019-11-29 ENCOUNTER — Encounter: Payer: Self-pay | Admitting: Podiatry

## 2019-11-29 DIAGNOSIS — M722 Plantar fascial fibromatosis: Secondary | ICD-10-CM | POA: Diagnosis not present

## 2019-12-02 NOTE — Progress Notes (Signed)
Subjective:   Patient ID: Paula [name], female   DOB: 58 y.o.   MRN: [id number]   HPI Patient presents stating the heel seems to be improving with mild discomfort only with certain activities or shoe gear   ROS      Objective:  Physical Exam  Neurovascular status intact with patient's right foot healing very well with patient having good heel toe gait pattern and mild edema but no other pathology noted     Assessment:  Doing well post treatment for fasciitis condition right     Plan:  H&P reviewed condition and I have recommended physical therapy anti-inflammatories and shoe gear modifications and spent a great deal of time educating her on this condition.  Patient will be seen back for [place] to recheck and is advised on treatment plans

## 2019-12-13 DIAGNOSIS — K219 Gastro-esophageal reflux disease without esophagitis: Secondary | ICD-10-CM | POA: Diagnosis not present

## 2019-12-13 DIAGNOSIS — K580 Irritable bowel syndrome with diarrhea: Secondary | ICD-10-CM | POA: Diagnosis not present

## 2019-12-13 DIAGNOSIS — L290 Pruritus ani: Secondary | ICD-10-CM | POA: Diagnosis not present

## 2019-12-13 DIAGNOSIS — R140 Abdominal distension (gaseous): Secondary | ICD-10-CM | POA: Diagnosis not present

## 2020-01-26 DIAGNOSIS — R7303 Prediabetes: Secondary | ICD-10-CM | POA: Diagnosis not present

## 2020-01-26 DIAGNOSIS — E782 Mixed hyperlipidemia: Secondary | ICD-10-CM | POA: Diagnosis not present

## 2020-02-11 ENCOUNTER — Ambulatory Visit

## 2020-02-28 ENCOUNTER — Ambulatory Visit: Payer: Self-pay

## 2020-03-13 DIAGNOSIS — M79622 Pain in left upper arm: Secondary | ICD-10-CM | POA: Diagnosis not present

## 2020-03-13 DIAGNOSIS — R599 Enlarged lymph nodes, unspecified: Secondary | ICD-10-CM | POA: Diagnosis not present

## 2020-05-03 DIAGNOSIS — K219 Gastro-esophageal reflux disease without esophagitis: Secondary | ICD-10-CM | POA: Diagnosis not present

## 2020-05-03 DIAGNOSIS — L659 Nonscarring hair loss, unspecified: Secondary | ICD-10-CM | POA: Diagnosis not present

## 2020-05-03 DIAGNOSIS — F419 Anxiety disorder, unspecified: Secondary | ICD-10-CM | POA: Diagnosis not present

## 2020-05-03 DIAGNOSIS — R030 Elevated blood-pressure reading, without diagnosis of hypertension: Secondary | ICD-10-CM | POA: Diagnosis not present

## 2020-05-03 DIAGNOSIS — E782 Mixed hyperlipidemia: Secondary | ICD-10-CM | POA: Diagnosis not present

## 2020-05-17 DIAGNOSIS — L659 Nonscarring hair loss, unspecified: Secondary | ICD-10-CM | POA: Diagnosis not present

## 2020-06-01 ENCOUNTER — Ambulatory Visit: Payer: PPO

## 2020-06-01 ENCOUNTER — Other Ambulatory Visit: Payer: Self-pay

## 2020-06-01 ENCOUNTER — Ambulatory Visit (INDEPENDENT_AMBULATORY_CARE_PROVIDER_SITE_OTHER): Payer: PPO | Admitting: Podiatry

## 2020-06-01 ENCOUNTER — Ambulatory Visit (INDEPENDENT_AMBULATORY_CARE_PROVIDER_SITE_OTHER): Payer: PPO

## 2020-06-01 DIAGNOSIS — M722 Plantar fascial fibromatosis: Secondary | ICD-10-CM | POA: Diagnosis not present

## 2020-06-01 DIAGNOSIS — Q666 Other congenital valgus deformities of feet: Secondary | ICD-10-CM

## 2020-06-01 MED ORDER — CLOTRIMAZOLE-BETAMETHASONE 1-0.05 % EX CREA: 1.0000 "application " | TOPICAL_CREAM | Freq: Two times a day (BID) | CUTANEOUS | 0 refills | Status: DC

## 2020-06-02 ENCOUNTER — Encounter: Payer: Self-pay | Admitting: Podiatry

## 2020-06-02 NOTE — Progress Notes (Signed)
Subjective:  Patient ID: Paula [name], female    DOB: [birth year],  MRN: [id number]  Chief Complaint  Patient presents with  . Foot Pain    pt is here for bil foot pain, possible plantar fasciitis    59 y.o. female presents with the above complaint.  Patient presents with bilateral plantar fasciitis with left severe than right.  Patient was treated by Dr. Paulla Dolly couple of years ago for the right side and the injection helped considerably at that time.  Patient would like to get another injection and discuss long-term management for plantar fasciitis.  Given that this has reoccurred she states that she would want to know if there is any exercises that she can perform.  She denies any other acute complaints.    Review of Systems: Negative except as noted in the HPI. Denies N/V/F/Ch.  Past Medical History:  Diagnosis Date  . Anxiety   . Chronic lower back pain   . Depression   . Diverticulitis   . Expressive aphasia 11/15/2016  . Fibromyalgia   . GERD (gastroesophageal reflux disease)   . High cholesterol   . History of gastric ulcer   . History of hiatal hernia   . History of kidney stones   . History of thyroid nodule   . Hyperlipidemia   . Hypertension   . IBS (irritable bowel syndrome)   . Intermittent vertigo   . Migraine    "a few times/year now; maybe" (11/15/2016)  . Obesity   . OSA on CPAP    wears CPAP nightly ;study in epic 02-01-2014 (11/15/2016)  . Osteoarthritis    "knees, hands, back, hips" (11/15/2016)  . Pneumonia X 1   hx of  . Psychogenic tremor    intermittant head tremor-(11/15/2016)  . Refusal of blood transfusions as patient is Jehovah's Witness   . RLS (restless legs syndrome)   . Sleep apnea   . Urinary incontinence     Current Outpatient Medications:  .  atorvastatin (LIPITOR) 20 MG tablet, Take 20 mg by mouth daily., Disp: , Rfl:  .  clotrimazole-betamethasone (LOTRISONE) cream, Apply 1 application topically 2 (two) times daily., Disp: 30  g, Rfl: 0 .  cyclobenzaprine (FLEXERIL) 10 MG tablet, Take 10 mg by mouth at bedtime as needed for muscle spasms., Disp: , Rfl:  .  dicyclomine (BENTYL) 10 MG capsule, Take 20 mg by mouth 3 (three) times daily as needed for cramping., Disp: , Rfl: 1 .  escitalopram (LEXAPRO) 10 MG tablet, Take 10 mg by mouth daily., Disp: , Rfl:  .  Estradiol 0.75 MG/1.25 GM (0.06%) topical gel, Apply topically., Disp: , Rfl:  .  hydrocortisone 2.5 % cream, hydrocortisone 2.5 % topical cream with perineal applicator, Disp: , Rfl:  .  hydrOXYzine (ATARAX/VISTARIL) 25 MG tablet, Take 1 tablet (25 mg total) by mouth every 6 (six) hours., Disp: 12 tablet, Rfl: 0 .  metFORMIN (GLUCOPHAGE) 500 MG tablet, Take 1 tablet (500 mg total) by mouth daily with breakfast., Disp: 30 tablet, Rfl: 0 .  pantoprazole (PROTONIX) 40 MG tablet, Take 40 mg by mouth 2 (two) times daily before a meal., Disp: , Rfl:  .  predniSONE (DELTASONE) 20 MG tablet, Take 20 mg by mouth 2 (two) times daily., Disp: , Rfl:  .  sertraline (ZOLOFT) 50 MG tablet, Take 50 mg by mouth daily., Disp: , Rfl:  .  tiZANidine (ZANAFLEX) 4 MG tablet, Take 1 tablet (4 mg total) by mouth every 6 (six) hours as needed  for muscle spasms., Disp: 30 tablet, Rfl: 0 .  traMADol (ULTRAM) 50 MG tablet, Take 1 tablet (50 mg total) by mouth every 6 (six) hours as needed., Disp: 10 tablet, Rfl: 0 .  valACYclovir (VALTREX) 500 MG tablet, Take 500 mg by mouth 2 (two) times daily., Disp: , Rfl:  .  Vitamin D, Ergocalciferol, (DRISDOL) 1.25 MG (50000 UT) CAPS capsule, Take 1 capsule (50,000 Units total) by mouth every 7 (seven) days., Disp: 4 capsule, Rfl: 0 .  topiramate (TOPAMAX) 50 MG tablet, Take 1 tablet (50 mg total) by mouth daily. At 5:00 PM, Disp: 30 tablet, Rfl: 0  Social History   Tobacco Use  Smoking Status Former Smoker  . Packs/day: 0.50  . Years: 25.00  . Pack years: 12.50  . Types: Cigarettes  . Quit date: 07/24/2008  . Years since quitting: 11.8  Smokeless  Tobacco Never Used    Allergies  Allergen Reactions  . Detrol [Tolterodine] Other (See Comments)    slurred speech, tremors, dizziness  . Gabapentin Palpitations and Other (See Comments)    Wt gain, tremor  . Aspirin Other (See Comments)    Avoids--- history of gastric ulcers   . Contrast Media [Iodinated Diagnostic Agents] Hives, Itching and Rash    CT contrast-Needed to take benadryl   . Hydrocodone-Acetaminophen Nausea And Vomiting  . Imitrex [Sumatriptan] Other (See Comments)    Body hurts  . Oxycodone Nausea And Vomiting  . Avelox [Moxifloxacin] Itching   Objective:  There were no vitals filed for this visit. There is no height or weight on file to calculate BMI. Constitutional Well developed. Well nourished.  Vascular Dorsalis pedis pulses palpable bilaterally. Posterior tibial pulses palpable bilaterally. Capillary refill normal to all digits.  No cyanosis or clubbing noted. Pedal hair growth normal.  Neurologic Normal speech. Oriented to person, place, and time. Epicritic sensation to light touch grossly present bilaterally.  Dermatologic Nails well groomed and normal in appearance. No open wounds. No skin lesions.  Orthopedic: Normal joint ROM without pain or crepitus bilaterally. No visible deformities. Tender to palpation at the calcaneal tuber bilaterally. No pain with calcaneal squeeze bilaterally. Ankle ROM diminished range of motion bilaterally. Silfverskiold Test: positive bilaterally.   Radiographs: Taken and reviewed. No acute fractures or dislocations. No evidence of stress fracture.  Plantar heel spur present. Posterior heel spur present.  There is decreasing calcaneal inclination angle increase in talar declination angle anterior break in the cyma line.  Findings are consistent with pes planovalgus deformity.  Assessment:   1. Plantar fasciitis of right foot   2. Pes planovalgus   3. Plantar fasciitis of left foot    Plan:  Patient was evaluated  and treated and all questions answered.  Plantar Fasciitis, bilaterally - XR reviewed as above.  - Educated on icing and stretching. Instructions given.  - Injection delivered to the plantar fascia as below. - DME: Plantar Fascial Brace x2 - Pharmacologic management: None  Pes planovalgus -I explained to the patient the etiology of pes planovalgus and various treatment options were extensively discussed with the patient.  Given that this is a recurrence plantar fasciitis I believe we need to address the flatfoot structure of the foot in order to really give her long-term relief.  I believe she will benefit from custom-made orthotics. -Should be scheduled to see [facility] for custom-made orthotics.  Procedure: Injection Tendon/Ligament Location: Bilateral plantar fascia at the glabrous junction; medial approach. Skin Prep: alcohol Injectate: 0.5 cc 0.5% marcaine plain, 0.5  cc of 1% Lidocaine, 0.5 cc kenalog 10. Disposition: Patient tolerated procedure well. Injection site dressed with a band-aid.  No follow-ups on file.

## 2020-06-13 DIAGNOSIS — G4733 Obstructive sleep apnea (adult) (pediatric): Secondary | ICD-10-CM | POA: Diagnosis not present

## 2020-06-23 DIAGNOSIS — J029 Acute pharyngitis, unspecified: Secondary | ICD-10-CM | POA: Diagnosis not present

## 2020-06-29 ENCOUNTER — Other Ambulatory Visit: Payer: PPO | Admitting: Orthotics

## 2020-06-30 ENCOUNTER — Observation Stay (HOSPITAL_COMMUNITY)
Admission: EM | Admit: 2020-06-30 | Discharge: 2020-07-02 | Disposition: A | Discharge status: Home / Self Care | Payer: PPO | Attending: Internal Medicine | Admitting: Internal Medicine

## 2020-06-30 ENCOUNTER — Other Ambulatory Visit: Payer: Self-pay

## 2020-06-30 ENCOUNTER — Encounter (HOSPITAL_COMMUNITY): Payer: Self-pay | Admitting: Emergency Medicine

## 2020-06-30 ENCOUNTER — Emergency Department (HOSPITAL_COMMUNITY): Payer: PPO

## 2020-06-30 DIAGNOSIS — Z87891 Personal history of nicotine dependence: Secondary | ICD-10-CM | POA: Diagnosis not present

## 2020-06-30 DIAGNOSIS — R479 Unspecified speech disturbances: Secondary | ICD-10-CM

## 2020-06-30 DIAGNOSIS — R4701 Aphasia: Secondary | ICD-10-CM | POA: Diagnosis not present

## 2020-06-30 DIAGNOSIS — M6281 Muscle weakness (generalized): Secondary | ICD-10-CM | POA: Insufficient documentation

## 2020-06-30 DIAGNOSIS — R29818 Other symptoms and signs involving the nervous system: Secondary | ICD-10-CM | POA: Diagnosis not present

## 2020-06-30 DIAGNOSIS — R531 Weakness: Secondary | ICD-10-CM | POA: Diagnosis not present

## 2020-06-30 DIAGNOSIS — E119 Type 2 diabetes mellitus without complications: Secondary | ICD-10-CM | POA: Insufficient documentation

## 2020-06-30 DIAGNOSIS — F410 Panic disorder [episodic paroxysmal anxiety] without agoraphobia: Secondary | ICD-10-CM | POA: Diagnosis not present

## 2020-06-30 DIAGNOSIS — G894 Chronic pain syndrome: Secondary | ICD-10-CM | POA: Diagnosis not present

## 2020-06-30 DIAGNOSIS — R519 Headache, unspecified: Secondary | ICD-10-CM | POA: Diagnosis not present

## 2020-06-30 DIAGNOSIS — Z79899 Other long term (current) drug therapy: Secondary | ICD-10-CM | POA: Insufficient documentation

## 2020-06-30 DIAGNOSIS — Z9989 Dependence on other enabling machines and devices: Secondary | ICD-10-CM

## 2020-06-30 DIAGNOSIS — Z7901 Long term (current) use of anticoagulants: Secondary | ICD-10-CM | POA: Insufficient documentation

## 2020-06-30 DIAGNOSIS — G43909 Migraine, unspecified, not intractable, without status migrainosus: Secondary | ICD-10-CM

## 2020-06-30 DIAGNOSIS — R110 Nausea: Secondary | ICD-10-CM | POA: Insufficient documentation

## 2020-06-30 DIAGNOSIS — R079 Chest pain, unspecified: Secondary | ICD-10-CM | POA: Insufficient documentation

## 2020-06-30 DIAGNOSIS — R4781 Slurred speech: Secondary | ICD-10-CM | POA: Diagnosis not present

## 2020-06-30 DIAGNOSIS — I10 Essential (primary) hypertension: Secondary | ICD-10-CM | POA: Diagnosis not present

## 2020-06-30 DIAGNOSIS — M797 Fibromyalgia: Secondary | ICD-10-CM | POA: Diagnosis not present

## 2020-06-30 DIAGNOSIS — F985 Adult onset fluency disorder: Principal | ICD-10-CM | POA: Insufficient documentation

## 2020-06-30 DIAGNOSIS — R2681 Unsteadiness on feet: Secondary | ICD-10-CM | POA: Diagnosis not present

## 2020-06-30 DIAGNOSIS — K219 Gastro-esophageal reflux disease without esophagitis: Secondary | ICD-10-CM | POA: Diagnosis not present

## 2020-06-30 DIAGNOSIS — G4733 Obstructive sleep apnea (adult) (pediatric): Secondary | ICD-10-CM | POA: Insufficient documentation

## 2020-06-30 DIAGNOSIS — Z20822 Contact with and (suspected) exposure to covid-19: Secondary | ICD-10-CM | POA: Diagnosis not present

## 2020-06-30 DIAGNOSIS — F411 Generalized anxiety disorder: Secondary | ICD-10-CM | POA: Diagnosis present

## 2020-06-30 DIAGNOSIS — F329 Major depressive disorder, single episode, unspecified: Secondary | ICD-10-CM | POA: Insufficient documentation

## 2020-06-30 DIAGNOSIS — E785 Hyperlipidemia, unspecified: Secondary | ICD-10-CM | POA: Insufficient documentation

## 2020-06-30 DIAGNOSIS — R251 Tremor, unspecified: Secondary | ICD-10-CM | POA: Diagnosis not present

## 2020-06-30 DIAGNOSIS — F419 Anxiety disorder, unspecified: Secondary | ICD-10-CM | POA: Diagnosis not present

## 2020-06-30 DIAGNOSIS — G252 Other specified forms of tremor: Secondary | ICD-10-CM | POA: Diagnosis present

## 2020-06-30 DIAGNOSIS — G459 Transient cerebral ischemic attack, unspecified: Secondary | ICD-10-CM

## 2020-06-30 DIAGNOSIS — Z7984 Long term (current) use of oral hypoglycemic drugs: Secondary | ICD-10-CM | POA: Insufficient documentation

## 2020-06-30 LAB — DIFFERENTIAL
Abs Immature Granulocytes: 0.02 10*3/uL (ref 0.00–0.07)
Basophils Absolute: 0.1 10*3/uL (ref 0.0–0.1)
Basophils Relative: 1 %
Eosinophils Absolute: 0.3 10*3/uL (ref 0.0–0.5)
Eosinophils Relative: 3 %
Immature Granulocytes: 0 %
Lymphocytes Relative: 45 %
Lymphs Abs: 4.5 10*3/uL — ABNORMAL HIGH (ref 0.7–4.0)
Monocytes Absolute: 0.6 10*3/uL (ref 0.1–1.0)
Monocytes Relative: 6 %
Neutro Abs: 4.6 10*3/uL (ref 1.7–7.7)
Neutrophils Relative %: 45 %

## 2020-06-30 LAB — I-STAT CHEM 8, ED
BUN: 12 mg/dL (ref 6–20)
Calcium, Ion: 1.25 mmol/L (ref 1.15–1.40)
Chloride: 105 mmol/L (ref 98–111)
Creatinine, Ser: 0.8 mg/dL (ref 0.44–1.00)
Glucose, Bld: 97 mg/dL (ref 70–99)
HCT: 39 % (ref 36.0–46.0)
Hemoglobin: 13.3 g/dL (ref 12.0–15.0)
Potassium: 3.9 mmol/L (ref 3.5–5.1)
Sodium: 142 mmol/L (ref 135–145)
TCO2: 33 mmol/L — ABNORMAL HIGH (ref 22–32)

## 2020-06-30 LAB — COMPREHENSIVE METABOLIC PANEL
ALT: 23 U/L (ref 0–44)
AST: 23 U/L (ref 15–41)
Albumin: 4.3 g/dL (ref 3.5–5.0)
Alkaline Phosphatase: 61 U/L (ref 38–126)
Anion gap: 9 (ref 5–15)
BUN: 9 mg/dL (ref 6–20)
CO2: 27 mmol/L (ref 22–32)
Calcium: 9.9 mg/dL (ref 8.9–10.3)
Chloride: 103 mmol/L (ref 98–111)
Creatinine, Ser: 0.8 mg/dL (ref 0.44–1.00)
GFR calc Af Amer: >60 mL/min (ref >60)
GFR calc non Af Amer: >60 mL/min (ref >60)
Glucose, Bld: 102 mg/dL — ABNORMAL HIGH (ref 70–99)
Potassium: 3.9 mmol/L (ref 3.5–5.1)
Sodium: 139 mmol/L (ref 135–145)
Total Bilirubin: 0.4 mg/dL (ref 0.3–1.2)
Total Protein: 7.5 g/dL (ref 6.5–8.1)

## 2020-06-30 LAB — CBC
HCT: 38.8 % (ref 36.0–46.0)
Hemoglobin: 12.2 g/dL (ref 12.0–15.0)
MCH: 28.7 pg (ref 26.0–34.0)
MCHC: 31.4 g/dL (ref 30.0–36.0)
MCV: 91.3 fL (ref 80.0–100.0)
Platelets: 340 10*3/uL (ref 150–400)
RBC: 4.25 MIL/uL (ref 3.87–5.11)
RDW: 11.8 % (ref 11.5–15.5)
WBC: 10.1 10*3/uL (ref 4.0–10.5)
nRBC: 0 % (ref 0.0–0.2)

## 2020-06-30 LAB — PROTIME-INR
INR: 1 (ref 0.8–1.2)
Prothrombin Time: 13.1 seconds (ref 11.4–15.2)

## 2020-06-30 LAB — SARS CORONAVIRUS 2 BY RT PCR (HOSPITAL ORDER, PERFORMED IN ~~LOC~~ HOSPITAL LAB): SARS Coronavirus 2: NEGATIVE

## 2020-06-30 LAB — I-STAT BETA HCG BLOOD, ED (MC, WL, AP ONLY): I-stat hCG, quantitative: <5 m[IU]/mL (ref <5)

## 2020-06-30 LAB — APTT: aPTT: 31 seconds (ref 24–36)

## 2020-06-30 LAB — CBG MONITORING, ED
Glucose-Capillary: 88 mg/dL (ref 70–99)
Glucose-Capillary: 86 mg/dL (ref 70–99)

## 2020-06-30 MED ORDER — ONDANSETRON HCL 4 MG PO TABS: 4.0000 mg | ORAL_TABLET | Freq: Four times a day (QID) | ORAL | Status: DC | PRN

## 2020-06-30 MED ORDER — PROCHLORPERAZINE EDISYLATE 10 MG/2ML IJ SOLN
10.0000 mg | Freq: Once | INTRAMUSCULAR | Status: AC
  Administered 2020-06-30: 10 mg via INTRAVENOUS
  Filled 2020-06-30: qty 2

## 2020-06-30 MED ORDER — ACETAMINOPHEN 650 MG RE SUPP: 650.0000 mg | Freq: Four times a day (QID) | RECTAL | Status: DC | PRN

## 2020-06-30 MED ORDER — POLYETHYLENE GLYCOL 3350 17 G PO PACK: 17.0000 g | PACK | Freq: Every day | ORAL | Status: DC | PRN

## 2020-06-30 MED ORDER — ACETAMINOPHEN 325 MG PO TABS
650.0000 mg | ORAL_TABLET | Freq: Four times a day (QID) | ORAL | Status: DC | PRN
  Administered 2020-07-02 (×2): 650 mg via ORAL
  Filled 2020-06-30 (×2): qty 2

## 2020-06-30 MED ORDER — SODIUM CHLORIDE 0.9% FLUSH: 3.0000 mL | Freq: Once | INTRAVENOUS | Status: DC

## 2020-06-30 MED ORDER — ESCITALOPRAM OXALATE 10 MG PO TABS
10.0000 mg | ORAL_TABLET | Freq: Every day | ORAL | Status: DC
  Administered 2020-07-01: 10 mg via ORAL
  Filled 2020-06-30: qty 1

## 2020-06-30 MED ORDER — INSULIN ASPART 100 UNIT/ML ~~LOC~~ SOLN: 0.0000 [IU] | Freq: Every day | SUBCUTANEOUS | Status: DC

## 2020-06-30 MED ORDER — ONDANSETRON HCL 4 MG/2ML IJ SOLN: 4.0000 mg | Freq: Four times a day (QID) | INTRAMUSCULAR | Status: DC | PRN

## 2020-06-30 MED ORDER — DIPHENHYDRAMINE HCL 50 MG/ML IJ SOLN
25.0000 mg | Freq: Once | INTRAMUSCULAR | Status: AC
  Administered 2020-06-30: 25 mg via INTRAVENOUS
  Filled 2020-06-30: qty 1

## 2020-06-30 MED ORDER — INSULIN ASPART 100 UNIT/ML ~~LOC~~ SOLN
0.0000 [IU] | Freq: Three times a day (TID) | SUBCUTANEOUS | Status: DC
  Administered 2020-07-02 (×2): 2 [IU] via SUBCUTANEOUS

## 2020-06-30 MED ORDER — PANTOPRAZOLE SODIUM 40 MG PO TBEC
40.0000 mg | DELAYED_RELEASE_TABLET | Freq: Every day | ORAL | Status: DC
  Administered 2020-07-01: 40 mg via ORAL
  Filled 2020-06-30: qty 1

## 2020-06-30 MED ORDER — ROSUVASTATIN CALCIUM 5 MG PO TABS
10.0000 mg | ORAL_TABLET | Freq: Every day | ORAL | Status: DC
  Administered 2020-07-01: 10 mg via ORAL
  Filled 2020-06-30: qty 2

## 2020-06-30 MED ORDER — ALPRAZOLAM 0.25 MG PO TABS
1.0000 mg | ORAL_TABLET | Freq: Once | ORAL | Status: AC
  Administered 2020-06-30: 1 mg via ORAL
  Filled 2020-06-30: qty 4

## 2020-06-30 MED ORDER — STROKE: EARLY STAGES OF RECOVERY BOOK: Freq: Once | Status: DC

## 2020-06-30 MED ORDER — ENOXAPARIN SODIUM 60 MG/0.6ML ~~LOC~~ SOLN
55.0000 mg | SUBCUTANEOUS | Status: DC
  Administered 2020-07-01: 55 mg via SUBCUTANEOUS
  Filled 2020-06-30: qty 0.55
  Filled 2020-06-30: qty 0.6

## 2020-06-30 NOTE — ED Notes (Signed)
Paula [name] ([facility] Stroke) called @ 1637-per Garlon Hatchet, RN called by Levada Dy

## 2020-06-30 NOTE — Consult Note (Signed)
Stroke Neurology Consultation Note  Consult Requested by: Dr. Rex Kras  Reason for Consult: Code stroke  Consult Date: 06/30/20  The history was obtained from the patient.  During history and examination, all items were able to obtain unless otherwise noted.  History of Present Illness:  Paula [name] is a 59 y.o. African American female with PMH of morbid obesity, prediabetes, anxiety, panic attack, depression sent from doctor's office for code stroke.  Patient has stuttering of speech and anxious with speech, therefore history is limited.  However, as per patient, she was in her doctor's office, during exam, her doctors was checking her extraocular movement, it triggered her headache.  The headache started at back of the head on the left, the pain also radiating to shoulder and left upper arm.  Then she also felt left hand tingling.  Shortly after she had tremor of her head, slurry speech, stuttering with words.  She also complains of nauseous but no vomiting, left arm and leg feel heavy.  EMS called, code stroke activated.  Patient sent to ER for evaluation.  As per patient, she was on Valium for anxiety and panic attack in the past but currently she is not on any benzos.  She is not on any antithrombotic medication.  Her other home medication including Metformin, Lexapro, Lipitor, Zoloft and hydroxyzine.  LSN: 3:30 PM tPA Given: No: None disabling symptoms, likely not stroke  Past Medical History:  Diagnosis Date  . Anxiety   . Chronic lower back pain   . Depression   . Diverticulitis   . Expressive aphasia 11/15/2016  . Fibromyalgia   . GERD (gastroesophageal reflux disease)   . High cholesterol   . History of gastric ulcer   . History of hiatal hernia   . History of kidney stones   . History of thyroid nodule   . Hyperlipidemia   . Hypertension   . IBS (irritable bowel syndrome)   . Intermittent vertigo   . Migraine    "a few times/year now; maybe" (11/15/2016)  . Obesity    . OSA on CPAP    wears CPAP nightly ;study in epic 02-01-2014 (11/15/2016)  . Osteoarthritis    "knees, hands, back, hips" (11/15/2016)  . Pneumonia X 1   hx of  . Psychogenic tremor    intermittant head tremor-(11/15/2016)  . Refusal of blood transfusions as patient is Jehovah's Witness   . RLS (restless legs syndrome)   . Sleep apnea   . Urinary incontinence     Past Surgical History:  Procedure Laterality Date  . ABDOMINAL HYSTERECTOMY  1990  . ANTERIOR CERVICAL DECOMP/DISCECTOMY FUSION  02-10-2004   C4 -- C7  . BACK SURGERY    . CARDIOVASCULAR STRESS TEST  2017   Negative  . CESAREAN SECTION  1977; 1982  . COLONOSCOPY    . CYSTOSCOPY W/ RETROGRADES Right 06/07/2015   Procedure: CYSTOSCOPY WITH RETROGRADE PYELOGRAM;  Surgeon: Ardis Hughs, MD;  Location: [facility];  Service: Urology;  Laterality: Right;  . CYSTOSCOPY WITH URETEROSCOPY AND STENT PLACEMENT Right 06/07/2015   Procedure: RIGHT URETEROSCOPY, LASER LITHOTRIPSY, STONE REMOVAL AND RIGHT URETERAL STENT PLACEMENT;  Surgeon: Ardis Hughs, MD;  Location: [facility];  Service: Urology;  Laterality: Right;  . HERNIA REPAIR    . HOLMIUM LASER APPLICATION N/A 06/05/1024   Procedure: HOLMIUM LASER APPLICATION;  Surgeon: Ardis Hughs, MD;  Location: [facility];  Service: Urology;  Laterality: N/A;  . LAPAROSCOPIC  CHOLECYSTECTOMY  10-07-2000  . LIPOMA EXCISION  01/2017   from neck  . UMBILICAL HERNIA REPAIR  age 20  . UPPER GI ENDOSCOPY      Family History  Problem Relation Age of Onset  . Colon cancer Maternal Grandfather   . Hypertension Maternal Grandfather   . Lung cancer Maternal Grandfather   . Throat cancer Maternal Grandfather   . Hyperlipidemia Mother   . Hypertension Mother   . Sleep apnea Mother   . Hypertension Father   . Heart disease Father   . Lung cancer Maternal Aunt   . Hypertension Sister   . Hypertension Maternal Grandmother   . CVA  Maternal Grandmother   . Hypertension Brother     Social History:  reports that she quit smoking about 11 years ago. Her smoking use included cigarettes. She has a 12.50 pack-year smoking history. She has never used smokeless tobacco. She reports current alcohol use. She reports that she does not use drugs.  Allergies:  Allergies  Allergen Reactions  . Detrol [Tolterodine] Other (See Comments)    slurred speech, tremors, dizziness  . Gabapentin Palpitations and Other (See Comments)    Wt gain, tremor  . Aspirin Other (See Comments)    Avoids--- history of gastric ulcers   . Contrast Media [Iodinated Diagnostic Agents] Hives, Itching and Rash    CT contrast-Needed to take benadryl   . Hydrocodone-Acetaminophen Nausea And Vomiting  . Imitrex [Sumatriptan] Other (See Comments)    Body hurts  . Oxycodone Nausea And Vomiting  . Avelox [Moxifloxacin] Itching    No current facility-administered medications on file prior to encounter.   Current Outpatient Medications on File Prior to Encounter  Medication Sig Dispense Refill  . atorvastatin (LIPITOR) 20 MG tablet Take 20 mg by mouth daily.    . clotrimazole-betamethasone (LOTRISONE) cream Apply 1 application topically 2 (two) times daily. 30 g 0  . cyclobenzaprine (FLEXERIL) 10 MG tablet Take 10 mg by mouth at bedtime as needed for muscle spasms.    [name] dicyclomine (BENTYL) 10 MG capsule Take 20 mg by mouth 3 (three) times daily as needed for cramping.  1  . escitalopram (LEXAPRO) 10 MG tablet Take 10 mg by mouth daily.    . Estradiol 0.75 MG/1.25 GM (0.06%) topical gel Apply topically.    . hydrocortisone 2.5 % cream hydrocortisone 2.5 % topical cream with perineal applicator    . hydrOXYzine (ATARAX/VISTARIL) 25 MG tablet Take 1 tablet (25 mg total) by mouth every 6 (six) hours. 12 tablet 0  . metFORMIN (GLUCOPHAGE) 500 MG tablet Take 1 tablet (500 mg total) by mouth daily with breakfast. 30 tablet 0  . pantoprazole (PROTONIX) 40 MG  tablet Take 40 mg by mouth 2 (two) times daily before a meal.    . predniSONE (DELTASONE) 20 MG tablet Take 20 mg by mouth 2 (two) times daily.    . sertraline (ZOLOFT) 50 MG tablet Take 50 mg by mouth daily.    [name] tiZANidine (ZANAFLEX) 4 MG tablet Take 1 tablet (4 mg total) by mouth every 6 (six) hours as needed for muscle spasms. 30 tablet 0  . topiramate (TOPAMAX) 50 MG tablet Take 1 tablet (50 mg total) by mouth daily. At 5:00 PM 30 tablet 0  . traMADol (ULTRAM) 50 MG tablet Take 1 tablet (50 mg total) by mouth every 6 (six) hours as needed. 10 tablet 0  . valACYclovir (VALTREX) 500 MG tablet Take 500 mg by mouth 2 (two) times daily.    [name]  Vitamin D, Ergocalciferol, (DRISDOL) 1.25 MG (50000 UT) CAPS capsule Take 1 capsule (50,000 Units total) by mouth every 7 (seven) days. 4 capsule 0    Review of Systems: A full ROS was attempted today and was not able to be performed due to slurry speech and stuttering speech.  Physical Examination: Temp:  [99.4 F (37.4 C)] 99.4 F (37.4 C) (07/30 1637) Pulse Rate:  [61-74] 61 (07/30 1730) Resp:  [18-27] 27 (07/30 1730) BP: (135-173)/(83-86) 135/83 (07/30 1730) SpO2:  [98 %-99 %] 98 % (07/30 1730) Weight:  [116.1 kg] 116.1 kg (07/30 1636)  General - well nourished, well developed, anxious and panic.    Ophthalmologic - fundi not visualized due to noncooperation.    Cardiovascular - regular rhythm and rate  Neuro - awake alert, anxious and panic, orientated to self, age, time and place.  No aphasia, paucity of speech, able to name and repeat, follows all simple commands.  However, mild dysarthria, stuttering speech with hesitation.  Visual field full, EOMI, PERRL.  Facial symmetrical, tongue midline.  However patient has tremor of the head, voluntary facial muscle contraction, both can be alleviated by distraction.  Bilateral upper extremity symmetrical strength, against gravity.  Bilateral lower extremity symmetrical strength, grants gravity.   Complains of left hip pain with left leg lifting up.  Sensation symmetrical except left facial and left hand decreased light touch sensation.  Finger-to-nose bilaterally grossly intact, however slow in action.  Later, patient was able to walk by herself go to bathroom and come back to the room.  NIH Stroke Scale  Level Of Consciousness 0=Alert; keenly responsive 1=Arouse to minor stimulation 2=Requires repeated stimulation to arouse or movements to pain 3=postures or unresponsive 0  LOC Questions to Month and Age 58=Answers both questions correctly 1=Answers one question correctly or dysarthria/intubated/trauma/language barrier 2=Answers neither question correctly or aphasia 0  LOC Commands      -Open/Close eyes     -Open/close grip     -Pantomime commands if communication barrier 0=Performs both tasks correctly 1=Performs one task correctly 2=Performs neighter task correctly 0  Best Gaze     -Only assess horizontal gaze 0=Normal 1=Partial gaze palsy 2=Forced deviation, or total gaze paresis 0  Visual 0=No visual loss 1=Partial hemianopia 2=Complete hemianopia 3=Bilateral hemianopia (blind including cortical blindness) 0  Facial Palsy     -Use grimace if obtunded 0=Normal symmetrical movement 1=Minor paralysis (asymmetry) 2=Partial paralysis (lower face) 3=Complete paralysis (upper and lower face) 0  Motor  0=No drift for 10/5 seconds 1=Drift, but does not hit bed 2=Some antigravity effort, hits  bed 3=No effort against gravity, limb falls 4=No movement 0=Amputation/joint fusion Right Arm 0     Leg 0    Left Arm 0     Leg 0  Limb Ataxia     - FNT/HTS 0=Absent or does not understand or paralyzed or amputation/joint fusion 1=Present in one limb 2=Present in two limbs 0  Sensory 0=Normal 1=Mild to moderate sensory loss 2=Severe to total sensory loss or coma/unresponsive 1  Best Language 0=No aphasia, normal 1=Mild to moderate aphasia 2=Severe aphasia 3=Mute, global  aphasia, or coma/unresponsive 0  Dysarthria 0=Normal 1=Mild to moderate 2=Severe, unintelligible or mute/anarthric 0=intubated/unable to test 1  Extinction/Neglect 0=No abnormality 1=visual/tactile/auditory/spatia/personal inattention/Extinction to bilateral simultaneous stimulation 2=Profound neglect/extinction more than 1 modality  0  Total   2     Data Reviewed: DG Foot Complete Left  Result Date: 06/06/2020 Please see detailed radiograph report in office note.  DG Foot Complete  Right  Result Date: 06/06/2020 Please see detailed radiograph report in office note.  CT HEAD CODE STROKE WO CONTRAST  Result Date: 06/30/2020 CLINICAL DATA:  Code stroke. 59 year old female with weakness and tremor. EXAM: CT HEAD WITHOUT CONTRAST TECHNIQUE: Contiguous axial images were obtained from the base of the skull through the vertex without intravenous contrast. COMPARISON:  Head CT 11/15/2016.  Brain MRI 11/15/2016. FINDINGS: Brain: Cerebral volume remains normal. No midline shift, ventriculomegaly, mass effect, evidence of mass lesion, intracranial hemorrhage or evidence of cortically based acute infarction. Gray-white matter differentiation is within normal limits throughout the brain. No encephalomalacia identified. Vascular: Mild Calcified atherosclerosis at the skull base. No suspicious intracranial vascular hyperdensity. Skull: Negative. Sinuses/Orbits: Visualized paranasal sinuses and mastoids are stable and well pneumatized. Other: No acute orbit or scalp soft tissue findings. ASPECTS [facility] Stroke Program Early CT Score) Total score (0-10 with 10 being normal): 10 IMPRESSION: 1. Stable and normal noncontrast CT appearance of the brain. ASPECTS 10. 2. These results were communicated to Dr. Cheral Marker at 5:03 pm on 06/30/2020 by text page via the [facility] messaging system. Electronically Signed   By: Genevie Ann M.D.   On: 06/30/2020 17:03    Assessment: 59 y.o. female  with PMH of morbid obesity,  prediabetes, anxiety, panic attack, depression sent from doctor's office for code stroke with symptoms of stuttering speech, left sided headache, left shoulder pain with left hand numbness.  NIH score 2, CT no acute abnormality.  Patient not a TPA candidate given none disabling symptoms and likely no stroke.  Patient etiology concerning for anxiety and panic attack.  However, given her risk factors, would recommend to finish off stroke work-up with overnight observation.  Will do MRI/MRA, carotid Doppler and 2D echo.  Stroke Risk Factors - diabetes mellitus, hypertension and Morbid obesity  Plan: - admit for overnight observation - HgbA1c, fasting lipid panel - MRI, MRA  of the brain without contrast - PT consult, OT consult, Speech consult - Echocardiogram - Carotid dopplers - ASA  - Xanax for anxiety - Risk factor modification - Telemetry monitoring - Frequent neuro checks  Thank you for this consultation and allowing [place] to participate in the care of this patient.  Rosalin Hawking, MD PhD Stroke Neurology 06/30/2020 6:32 PM

## 2020-06-30 NOTE — ED Notes (Signed)
Admitting paged regarding patients Stroke Swallow Screen Results

## 2020-06-30 NOTE — Progress Notes (Signed)
Pt arrived to [facility] at 1630. She c/o slurred speech and h/a. Code stroke was called at 1639. Pt was taken to bridge where she was handed off to the Stroke RN. She was taken to CT at 1654. Pt was an NIHSS of 3 by this examiner (mild decrease Lt facial sensation, mild aphasia and mild dysarthria). She was tremorous and sweating.She appeared anxious. She c/o headache and left arm pain and sore throat. NCCT negative per Neurologist.Pt not candidate for TPA as low suspiscion for stroke. Pt not a candidate for NIR as LVO negative. Pt taken to ED room 22. Pt will need q 2 hr VS and MNIHSS for the next 12 hrs while her workup continues. Handoff with Teacher, adult education.

## 2020-06-30 NOTE — ED Provider Notes (Signed)
[place] EMERGENCY DEPARTMENT Provider Note   CSN: [id number] Arrival date & time: 06/30/20  1626     History Chief Complaint  Patient presents with  . Aphasia    AISLYN HAYSE is a 59 y.o. female.  22-year-old female with past medical history below including OSA, hypertension, hyperlipidemia, migraines, fibromyalgia, anxiety who presents with speech problems.  Patient was at the PCP office today being evaluated and she tells me that while PCP was doing extraocular movements, she began having double vision as well as pain in her left occipital head going down her left neck into her left shoulder.  She also reports that her face drew up which has never happened before.  She was sent to the ED and EMS called code stroke in route.  Symptom onset 3:30 PM today.  She reports history of migraines but has not had these symptoms with migraines before.  She reports she was in her usual state of health this morning.  She denies any recent head injury or falls.  The history is provided by the patient and the EMS personnel.       Past Medical History:  Diagnosis Date  . Anxiety   . Chronic lower back pain   . Depression   . Diverticulitis   . Expressive aphasia 11/15/2016  . Fibromyalgia   . GERD (gastroesophageal reflux disease)   . High cholesterol   . History of gastric ulcer   . History of hiatal hernia   . History of kidney stones   . History of thyroid nodule   . Hyperlipidemia   . Hypertension   . IBS (irritable bowel syndrome)   . Intermittent vertigo   . Migraine    "a few times/year now; maybe" (11/15/2016)  . Obesity   . OSA on CPAP    wears CPAP nightly ;study in epic 02-01-2014 (11/15/2016)  . Osteoarthritis    "knees, hands, back, hips" (11/15/2016)  . Pneumonia X 1   hx of  . Psychogenic tremor    intermittant head tremor-(11/15/2016)  . Refusal of blood transfusions as patient is Jehovah's Witness   . RLS (restless legs syndrome)   .  Sleep apnea   . Urinary incontinence     Patient Active Problem List   Diagnosis Date Noted  . Adult onset stuttering 06/30/2020  . Migraine 06/30/2020  . Fibromyalgia 06/30/2020  . Type 2 diabetes mellitus without complication ([place]) 91/47/8295  . Atypical chest pain 08/13/2018  . Depression, major 03/17/2018  . Chronic pain syndrome 03/17/2018  . Expressive aphasia 11/15/2016  . Cough 11/08/2015  . GERD (gastroesophageal reflux disease) 11/08/2015  . B12 deficiency 12/07/2014  . Wart viral 11/07/2014  . Coarse tremors 02/09/2014  . OSA on CPAP 01/28/2014  . Psychosomatic disorder 01/14/2014  . Dyspnea 01/12/2014  . Tremor of face and hands 01/12/2014  . Breast pain in female 02/09/2013  . Elevated WBC count 10/12/2012  . Abdominal pain, other specified site 10/09/2012  . Abdominal bloating 10/09/2012  . Ovarian cystic mass 05/25/2012  . Shoulder pain, right 03/11/2012  . Right arm pain 12/30/2011  . Acute sinusitis 07/30/2010  . ACNE, ROSACEA 07/17/2010  . LEG PAIN 07/17/2010  . NECK PAIN 05/10/2010  . ABDOMINAL PAIN, EPIGASTRIC 05/10/2010  . BURSITIS, LEFT HIP 05/01/2010  . PLANTAR FASCIITIS, BILATERAL 05/01/2010  . HYPERGLYCEMIA 01/05/2010  . PRURITUS 11/27/2009  . TOBACCO USE, QUIT 11/27/2009  . Anxiety state 11/22/2008  . FATIGUE 11/22/2008  . Essential hypertension 10/07/2008  .  Insomnia, unspecified 08/30/2008  . Obesity 08/19/2008  . NAUSEA ALONE 05/02/2008  . VERTIGO 01/19/2008  . DIARRHEA 01/19/2008  . THYROID NODULE 06/23/2007  . Dyslipidemia 06/23/2007  . Situational depression 06/23/2007  . ADD 06/23/2007  . TMJ SYNDROME 06/23/2007  . GERD 06/23/2007  . Irritable bowel syndrome 06/23/2007  . Headache 06/23/2007    Past Surgical History:  Procedure Laterality Date  . ABDOMINAL HYSTERECTOMY  1990  . ANTERIOR CERVICAL DECOMP/DISCECTOMY FUSION  02-10-2004   C4 -- C7  . BACK SURGERY    . CARDIOVASCULAR STRESS TEST  2017   Negative  . CESAREAN  SECTION  1977; 1982  . COLONOSCOPY    . CYSTOSCOPY W/ RETROGRADES Right 06/07/2015   Procedure: CYSTOSCOPY WITH RETROGRADE PYELOGRAM;  Surgeon: Ardis Hughs, MD;  Location: [facility];  Service: Urology;  Laterality: Right;  . CYSTOSCOPY WITH URETEROSCOPY AND STENT PLACEMENT Right 06/07/2015   Procedure: RIGHT URETEROSCOPY, LASER LITHOTRIPSY, STONE REMOVAL AND RIGHT URETERAL STENT PLACEMENT;  Surgeon: Ardis Hughs, MD;  Location: [facility];  Service: Urology;  Laterality: Right;  . HERNIA REPAIR    . HOLMIUM LASER APPLICATION N/A 1/0/2585   Procedure: HOLMIUM LASER APPLICATION;  Surgeon: Ardis Hughs, MD;  Location: [facility];  Service: Urology;  Laterality: N/A;  . LAPAROSCOPIC CHOLECYSTECTOMY  10-07-2000  . LIPOMA EXCISION  01/2017   from neck  . UMBILICAL HERNIA REPAIR  age 66  . UPPER GI ENDOSCOPY       OB History    Gravida  3   Para  2   Term  2   Preterm      AB  1   Living  2     SAB      TAB  1   Ectopic      Multiple      Live Births              Family History  Problem Relation Age of Onset  . Colon cancer Maternal Grandfather   . Hypertension Maternal Grandfather   . Lung cancer Maternal Grandfather   . Throat cancer Maternal Grandfather   . Hyperlipidemia Mother   . Hypertension Mother   . Sleep apnea Mother   . Hypertension Father   . Heart disease Father   . Lung cancer Maternal Aunt   . Hypertension Sister   . Hypertension Maternal Grandmother   . CVA Maternal Grandmother   . Hypertension Brother     Social History   Tobacco Use  . Smoking status: Former Smoker    Packs/day: 0.50    Years: 25.00    Pack years: 12.50    Types: Cigarettes    Quit date: 07/24/2008    Years since quitting: 11.9  . Smokeless tobacco: Never Used  Vaping Use  . Vaping Use: Never used  Substance Use Topics  . Alcohol use: Yes    Alcohol/week: 0.0 standard drinks    Comment: 11/15/2016  "might have 1 drink/month; if that"  . Drug use: No    Home Medications Prior to Admission medications   Medication Sig Start Date End Date Taking? Authorizing Provider  atorvastatin (LIPITOR) 20 MG tablet Take 20 mg by mouth daily. 05/01/18   [provider]  clotrimazole-betamethasone (LOTRISONE) cream Apply 1 application topically 2 (two) times daily. 06/01/20   Felipa Furnace, DPM  cyclobenzaprine (FLEXERIL) 10 MG tablet Take 10 mg by mouth at bedtime as needed for muscle spasms.  [provider]  dicyclomine (BENTYL) 10 MG capsule Take 20 mg by mouth 3 (three) times daily as needed for cramping. 07/09/18   [provider]  escitalopram (LEXAPRO) 10 MG tablet Take 10 mg by mouth daily. 10/14/19   [provider]  Estradiol 0.75 MG/1.25 GM (0.06%) topical gel Apply topically.    [provider]  hydrocortisone 2.5 % cream hydrocortisone 2.5 % topical cream with perineal applicator    [provider]  hydrOXYzine (ATARAX/VISTARIL) 25 MG tablet Take 1 tablet (25 mg total) by mouth every 6 (six) hours. 06/02/19   Jacqlyn Larsen, PA-C  metFORMIN (GLUCOPHAGE) 500 MG tablet Take 1 tablet (500 mg total) by mouth daily with breakfast. 11/10/18   Jearld Lesch A, DO  pantoprazole (PROTONIX) 40 MG tablet Take 40 mg by mouth 2 (two) times daily before a meal.    [provider]  predniSONE (DELTASONE) 20 MG tablet Take 20 mg by mouth 2 (two) times daily. 10/07/19   [provider]  sertraline (ZOLOFT) 50 MG tablet Take 50 mg by mouth daily.    [provider]  tiZANidine (ZANAFLEX) 4 MG tablet Take 1 tablet (4 mg total) by mouth every 6 (six) hours as needed for muscle spasms. 10/01/18   Kathrene Alu, MD  topiramate (TOPAMAX) 50 MG tablet Take 1 tablet (50 mg total) by mouth daily. At 5:00 PM 10/22/18 06/30/20  Jearld Lesch A, DO  traMADol (ULTRAM) 50 MG tablet Take 1 tablet (50 mg total) by mouth every 6 (six) hours as  needed. 07/15/18   Carlisle Cater, PA-C  valACYclovir (VALTREX) 500 MG tablet Take 500 mg by mouth 2 (two) times daily. 09/01/19   [provider]  Vitamin D, Ergocalciferol, (DRISDOL) 1.25 MG (50000 UT) CAPS capsule Take 1 capsule (50,000 Units total) by mouth every 7 (seven) days. 10/22/18   Jearld Lesch A, DO    Allergies    Detrol [tolterodine], Gabapentin, Aspirin, Contrast media [iodinated diagnostic agents], Hydrocodone-acetaminophen, Imitrex [sumatriptan], Oxycodone, and Avelox [moxifloxacin]  Review of Systems   Review of Systems All other systems reviewed and are negative except that which was mentioned in HPI  Physical Exam Updated Vital Signs BP (!) 135/83   Pulse 61   Temp 99.4 F (37.4 C) (Oral)   Resp (!) 27   Ht _0  (1.651 m)   Wt (!) 116.1 kg   SpO2 98%   BMI 42.59 kg/m   Physical Exam Vitals and nursing note reviewed.  Constitutional:      General: She is not in acute distress.    Appearance: She is well-developed.  HENT:     Head: Normocephalic and atraumatic.     Mouth/Throat:     Mouth: Mucous membranes are moist.  Eyes:     Extraocular Movements: Extraocular movements intact.     Conjunctiva/sclera: Conjunctivae normal.     Pupils: Pupils are equal, round, and reactive to light.  Neck:     Comments: Tenderness L cervical paraspinal muscles into trapezius Cardiovascular:     Rate and Rhythm: Normal rate and regular rhythm.     Heart sounds: Normal heart sounds. No murmur heard.   Pulmonary:     Effort: Pulmonary effort is normal.     Breath sounds: Normal breath sounds.  Abdominal:     General: Bowel sounds are normal. There is no distension.     Palpations: Abdomen is soft.     Tenderness: There is no abdominal tenderness.  Musculoskeletal:  Cervical back: Neck supple.  Skin:    General: Skin is warm and dry.  Neurological:     Mental Status: She is alert and oriented to person, place, and time.     Comments: Stuttering speech  but no expressive or receptive aphasia; initially 4/5 strength LUE but improves w/ coaching, otherwise normal strength throughout; normal finger to nose testing with coaching.   Psychiatric:     Comments: anxious     ED Results / Procedures / Treatments   Labs (all labs ordered are listed, but only abnormal results are displayed) Labs Reviewed  DIFFERENTIAL - Abnormal; Notable for the following components:      Result Value   Lymphs Abs 4.5 (*)    All other components within normal limits  COMPREHENSIVE METABOLIC PANEL - Abnormal; Notable for the following components:   Glucose, Bld 102 (*)    All other components within normal limits  I-STAT CHEM 8, ED - Abnormal; Notable for the following components:   TCO2 33 (*)    All other components within normal limits  SARS CORONAVIRUS 2 BY RT PCR (HOSPITAL ORDER, [place] [facility])  PROTIME-INR  APTT  CBC  HEMOGLOBIN A1C  LIPID PANEL  CBG MONITORING, ED  I-STAT BETA HCG BLOOD, ED ([facility], [facility], [facility] ONLY)    EKG EKG Interpretation  Date/Time:  Friday June 30 2020 16:34:51 EDT Ventricular Rate:  71 PR Interval:  154 QRS Duration: 76 QT Interval:  418 QTC Calculation: 454 R Axis:   -7 Text Interpretation: Sinus rhythm with occasional Premature ventricular complexes Minimal voltage criteria for LVH, may be normal variant ( R in aVL ) Nonspecific ST and T wave abnormality Abnormal ECG unable to interpret due to artifact Confirmed by Theotis Burrow [phone]) on 06/30/2020 5:31:44 PM   Radiology MR ANGIO HEAD WO CONTRAST  Result Date: 06/30/2020 CLINICAL DATA:  59 year old female code stroke presentation with weakness and tremor. EXAM: MRI HEAD WITHOUT CONTRAST MRA HEAD WITHOUT CONTRAST TECHNIQUE: Multiplanar, multiecho pulse sequences of the brain and surrounding structures were obtained without intravenous contrast. Angiographic images of the head were obtained using MRA technique without contrast. COMPARISON:  Head CT  earlier today.  Brain MRI 11/15/2016 FINDINGS: MRI HEAD FINDINGS Brain: No restricted diffusion to suggest acute infarction. No midline shift, mass effect, evidence of mass lesion, ventriculomegaly, extra-axial collection or acute intracranial hemorrhage. Cervicomedullary junction and pituitary are within normal limits. Cerebral volume remains normal for age. [place] Cables and white matter signal remains within normal limits for age. No convincing encephalomalacia. No chronic cerebral blood products. Vascular: Major intracranial vascular flow voids are stable. Skull and upper cervical spine: Visible bone marrow signal is stable and within normal limits. Partially visible hardware artifact from cervical ACDF. Sinuses/Orbits: Negative. Other: Mastoids remain clear. Grossly negative internal auditory structures. MRA HEAD FINDINGS Mild motion artifact. Antegrade flow in the posterior circulation with fairly codominant distal vertebral arteries. No distal vertebral artery or basilar artery stenosis. Motion artifact at the basilar tip. Patent SCA and PCA origins. Left posterior communicating artery is present. Bilateral PCA branches are within normal limits. Antegrade flow in both ICA siphons. Tortuous cavernous ICAs. No siphon stenosis identified. Normal left posterior communicating artery origin. Ophthalmic artery origins appear within normal limits. Patent carotid termini. Normal MCA and ACA origins. Diminutive or absent anterior communicating artery. MCA M1 segments and bifurcations are patent without stenosis. Some MCA and ACA branch detail is obscured. No branch occlusion identified. IMPRESSION: 1. No acute intracranial abnormality.  Stable and normal for age noncontrast MRI appearance of the brain. 2. Intracranial MRA is negative when allowing for some motion artifact. Electronically Signed   By: Genevie Ann M.D.   On: 06/30/2020 19:10   MR BRAIN WO CONTRAST  Result Date: 06/30/2020 CLINICAL DATA:  59 year old female code  stroke presentation with weakness and tremor. EXAM: MRI HEAD WITHOUT CONTRAST MRA HEAD WITHOUT CONTRAST TECHNIQUE: Multiplanar, multiecho pulse sequences of the brain and surrounding structures were obtained without intravenous contrast. Angiographic images of the head were obtained using MRA technique without contrast. COMPARISON:  Head CT earlier today.  Brain MRI 11/15/2016 FINDINGS: MRI HEAD FINDINGS Brain: No restricted diffusion to suggest acute infarction. No midline shift, mass effect, evidence of mass lesion, ventriculomegaly, extra-axial collection or acute intracranial hemorrhage. Cervicomedullary junction and pituitary are within normal limits. Cerebral volume remains normal for age. [place] Cables and white matter signal remains within normal limits for age. No convincing encephalomalacia. No chronic cerebral blood products. Vascular: Major intracranial vascular flow voids are stable. Skull and upper cervical spine: Visible bone marrow signal is stable and within normal limits. Partially visible hardware artifact from cervical ACDF. Sinuses/Orbits: Negative. Other: Mastoids remain clear. Grossly negative internal auditory structures. MRA HEAD FINDINGS Mild motion artifact. Antegrade flow in the posterior circulation with fairly codominant distal vertebral arteries. No distal vertebral artery or basilar artery stenosis. Motion artifact at the basilar tip. Patent SCA and PCA origins. Left posterior communicating artery is present. Bilateral PCA branches are within normal limits. Antegrade flow in both ICA siphons. Tortuous cavernous ICAs. No siphon stenosis identified. Normal left posterior communicating artery origin. Ophthalmic artery origins appear within normal limits. Patent carotid termini. Normal MCA and ACA origins. Diminutive or absent anterior communicating artery. MCA M1 segments and bifurcations are patent without stenosis. Some MCA and ACA branch detail is obscured. No branch occlusion identified.  IMPRESSION: 1. No acute intracranial abnormality. Stable and normal for age noncontrast MRI appearance of the brain. 2. Intracranial MRA is negative when allowing for some motion artifact. Electronically Signed   By: Genevie Ann M.D.   On: 06/30/2020 19:10   CT HEAD CODE STROKE WO CONTRAST  Result Date: 06/30/2020 CLINICAL DATA:  Code stroke. 59 year old female with weakness and tremor. EXAM: CT HEAD WITHOUT CONTRAST TECHNIQUE: Contiguous axial images were obtained from the base of the skull through the vertex without intravenous contrast. COMPARISON:  Head CT 11/15/2016.  Brain MRI 11/15/2016. FINDINGS: Brain: Cerebral volume remains normal. No midline shift, ventriculomegaly, mass effect, evidence of mass lesion, intracranial hemorrhage or evidence of cortically based acute infarction. Gray-white matter differentiation is within normal limits throughout the brain. No encephalomalacia identified. Vascular: Mild Calcified atherosclerosis at the skull base. No suspicious intracranial vascular hyperdensity. Skull: Negative. Sinuses/Orbits: Visualized paranasal sinuses and mastoids are stable and well pneumatized. Other: No acute orbit or scalp soft tissue findings. ASPECTS [facility] Stroke Program Early CT Score) Total score (0-10 with 10 being normal): 10 IMPRESSION: 1. Stable and normal noncontrast CT appearance of the brain. ASPECTS 10. 2. These results were communicated to Dr. Cheral Marker at 5:03 pm on 06/30/2020 by text page via the [facility] messaging system. Electronically Signed   By: Genevie Ann M.D.   On: 06/30/2020 17:03    Procedures Procedures (including critical care time)  Medications Ordered in ED Medications  sodium chloride flush (NS) 0.9 % injection 3 mL (0 mLs Intravenous Hold 06/30/20 1718)  ondansetron (ZOFRAN) injection 4 mg (has no administration in time range)  diphenhydrAMINE (BENADRYL) injection  25 mg (has no administration in time range)  prochlorperazine (COMPAZINE) injection 10 mg (has no  administration in time range)   stroke: mapping our early stages of recovery book (0 each Does not apply Hold 06/30/20 1907)  ALPRAZolam Duanne Moron) tablet 1 mg (1 mg Oral Given 06/30/20 1734)    ED Course  I have reviewed the triage vital signs and the nursing notes.  Pertinent labs & imaging results that were available during my care of the patient were reviewed by me and considered in my medical decision making (see chart for details).    MDM Rules/Calculators/A&P                          Pt made code stroke in route, taken to CT scanner and met by stroke team. Dr. Erlinda Hong evaluated pt. Head CT negative acute. PT ambulatory in ED. low suspicion for acute stroke however she does have risk factors for stroke therefore he recommended overnight observation with MRI/MRA, Dopplers, and echo.  Patient was given Xanax initially for anxiety, I added migraine cocktail given her head and neck pain.  Discussed admission with Dr. Claria Dice.  Final Clinical Impression(s) / ED Diagnoses Final diagnoses:  None    Rx / DC Orders ED Discharge Orders    None       Pratham Cassatt, Wenda Overland, MD 06/30/20 2139

## 2020-06-30 NOTE — ED Triage Notes (Signed)
Pt sent to the ED from PCP for stroke like symptoms, pt having HA and slurred speech triage. Pt states all symptoms started today at 15:30.

## 2020-06-30 NOTE — H&P (Signed)
PCP:   Carol Ada, MD   Chief Complaint: Headaches and stuttering speech   HPI: This is a 59 year old female who states that she has had a temporal headache for the past couple of weeks.  It eventually moved to the back of her head on the left and stayed.  She had pain started in the back of her shoulder and her arm.  She started feeling really badly and she went and saw her MD today.  While her MD was with eye exam, she developed watery eyes, then double vision.  She started stretching, her fingers were tingling and numb and her face appeared to become frozen around her small.  She had palpitations, nausea and was very anxious.  She also reported that she had left arm pains.  Her PCP called 911 and sent her into the ER as a code stroke.  She also reports she had some fleeting chest pains that have now resolved.  Patient states she has had speech impediment/stuttering before but she has never had it associated with this facial asymmetry.  Thought process currently in the ER during the interview  The hospitalist have been asked to admit.  Review of Systems:  The patient denies anorexia, fever, weight loss,, vision loss, decreased hearing, hoarseness, chest pain, syncope, dyspnea on exertion, peripheral edema, balance deficits, hemoptysis, abdominal pain, melena, hematochezia, severe indigestion/heartburn, hematuria, incontinence, genital sores, muscle weakness, suspicious skin lesions, transient blindness, difficulty walking, depression, unusual weight change, abnormal bleeding, enlarged lymph nodes, angioedema, and breast masses. Positives: Headache, double vision, palpitation, dizziness, stuttering speech, numbness fingers  Past Medical History: Past Medical History:  Diagnosis Date  . Anxiety   . Chronic lower back pain   . Depression   . Diverticulitis   . Expressive aphasia 11/15/2016  . Fibromyalgia   . GERD (gastroesophageal reflux disease)   . High cholesterol   . History of  gastric ulcer   . History of hiatal hernia   . History of kidney stones   . History of thyroid nodule   . Hyperlipidemia   . Hypertension   . IBS (irritable bowel syndrome)   . Intermittent vertigo   . Migraine    "a few times/year now; maybe" (11/15/2016)  . Obesity   . OSA on CPAP    wears CPAP nightly ;study in epic 02-01-2014 (11/15/2016)  . Osteoarthritis    "knees, hands, back, hips" (11/15/2016)  . Pneumonia X 1   hx of  . Psychogenic tremor    intermittant head tremor-(11/15/2016)  . Refusal of blood transfusions as patient is Jehovah's Witness   . RLS (restless legs syndrome)   . Sleep apnea   . Urinary incontinence    Past Surgical History:  Procedure Laterality Date  . ABDOMINAL HYSTERECTOMY  1990  . ANTERIOR CERVICAL DECOMP/DISCECTOMY FUSION  02-10-2004   C4 -- C7  . BACK SURGERY    . CARDIOVASCULAR STRESS TEST  2017   Negative  . CESAREAN SECTION  1977; 1982  . COLONOSCOPY    . CYSTOSCOPY W/ RETROGRADES Right 06/07/2015   Procedure: CYSTOSCOPY WITH RETROGRADE PYELOGRAM;  Surgeon: Ardis Hughs, MD;  Location: [facility];  Service: Urology;  Laterality: Right;  . CYSTOSCOPY WITH URETEROSCOPY AND STENT PLACEMENT Right 06/07/2015   Procedure: RIGHT URETEROSCOPY, LASER LITHOTRIPSY, STONE REMOVAL AND RIGHT URETERAL STENT PLACEMENT;  Surgeon: Ardis Hughs, MD;  Location: [facility];  Service: Urology;  Laterality: Right;  . HERNIA REPAIR    . HOLMIUM  LASER APPLICATION N/A 04/06/8126   Procedure: HOLMIUM LASER APPLICATION;  Surgeon: Ardis Hughs, MD;  Location: [facility];  Service: Urology;  Laterality: N/A;  . LAPAROSCOPIC CHOLECYSTECTOMY  10-07-2000  . LIPOMA EXCISION  01/2017   from neck  . UMBILICAL HERNIA REPAIR  age 54  . UPPER GI ENDOSCOPY      Medications: Prior to Admission medications   Medication Sig Start Date End Date Taking? Authorizing Provider  atorvastatin (LIPITOR) 20 MG tablet Take  20 mg by mouth daily. 05/01/18   [provider]  clotrimazole-betamethasone (LOTRISONE) cream Apply 1 application topically 2 (two) times daily. 06/01/20   Felipa Furnace, DPM  cyclobenzaprine (FLEXERIL) 10 MG tablet Take 10 mg by mouth at bedtime as needed for muscle spasms.    [provider]  dicyclomine (BENTYL) 10 MG capsule Take 20 mg by mouth 3 (three) times daily as needed for cramping. 07/09/18   [provider]  escitalopram (LEXAPRO) 10 MG tablet Take 10 mg by mouth daily. 10/14/19   [provider]  Estradiol 0.75 MG/1.25 GM (0.06%) topical gel Apply topically.    [provider]  hydrocortisone 2.5 % cream hydrocortisone 2.5 % topical cream with perineal applicator    [provider]  hydrOXYzine (ATARAX/VISTARIL) 25 MG tablet Take 1 tablet (25 mg total) by mouth every 6 (six) hours. 06/02/19   Jacqlyn Larsen, PA-C  metFORMIN (GLUCOPHAGE) 500 MG tablet Take 1 tablet (500 mg total) by mouth daily with breakfast. 11/10/18   Jearld Lesch A, DO  pantoprazole (PROTONIX) 40 MG tablet Take 40 mg by mouth 2 (two) times daily before a meal.    [provider]  predniSONE (DELTASONE) 20 MG tablet Take 20 mg by mouth 2 (two) times daily. 10/07/19   [provider]  sertraline (ZOLOFT) 50 MG tablet Take 50 mg by mouth daily.    [provider]  tiZANidine (ZANAFLEX) 4 MG tablet Take 1 tablet (4 mg total) by mouth every 6 (six) hours as needed for muscle spasms. 10/01/18   Kathrene Alu, MD  topiramate (TOPAMAX) 50 MG tablet Take 1 tablet (50 mg total) by mouth daily. At 5:00 PM 10/22/18 11/21/18  Jearld Lesch A, DO  traMADol (ULTRAM) 50 MG tablet Take 1 tablet (50 mg total) by mouth every 6 (six) hours as needed. 07/15/18   Carlisle Cater, PA-C  valACYclovir (VALTREX) 500 MG tablet Take 500 mg by mouth 2 (two) times daily. 09/01/19   [provider]  Vitamin D, Ergocalciferol, (DRISDOL) 1.25 MG (50000 UT) CAPS  capsule Take 1 capsule (50,000 Units total) by mouth every 7 (seven) days. 10/22/18   Georgia Lopes, DO    Allergies:   Allergies  Allergen Reactions  . Detrol [Tolterodine] Other (See Comments)    slurred speech, tremors, dizziness  . Gabapentin Palpitations and Other (See Comments)    Wt gain, tremor  . Aspirin Other (See Comments)    Avoids--- history of gastric ulcers   . Contrast Media [Iodinated Diagnostic Agents] Hives, Itching and Rash    CT contrast-Needed to take benadryl   . Hydrocodone-Acetaminophen Nausea And Vomiting  . Imitrex [Sumatriptan] Other (See Comments)    Body hurts  . Oxycodone Nausea And Vomiting  . Avelox [Moxifloxacin] Itching    Social History:  reports that she quit smoking about 11 years ago. Her smoking use included cigarettes. She has a 12.50 pack-year smoking history. She has never used smokeless tobacco. She reports  current alcohol use. She reports that she does not use drugs.  Family History: Family History  Problem Relation Age of Onset  . Colon cancer Maternal Grandfather   . Hypertension Maternal Grandfather   . Lung cancer Maternal Grandfather   . Throat cancer Maternal Grandfather   . Hyperlipidemia Mother   . Hypertension Mother   . Sleep apnea Mother   . Hypertension Father   . Heart disease Father   . Lung cancer Maternal Aunt   . Hypertension Sister   . Hypertension Maternal Grandmother   . CVA Maternal Grandmother   . Hypertension Brother     Physical Exam: Vitals:   06/30/20 1636 06/30/20 1637 06/30/20 1730  BP:  (!) 173/86 (!) 135/83  Pulse:  74 61  Resp:  18 (!) 27  Temp:  99.4 F (37.4 C)   TempSrc:  Oral   SpO2:  99% 98%  Weight: (!) 116.1 kg    Height: 5\' 5"  (1.651 m) 5\' 5"  (1.651 m)     General:  Alert and oriented times three, well developed and nourished, no acute distress Eyes: PERRLA, pink conjunctiva, no scleral icterus ENT: Moist oral mucosa, neck supple, no thyromegaly Lungs: clear to  ascultation, no wheeze, no crackles, no use of accessory muscles Cardiovascular: regular rate and rhythm, no regurgitation, no gallops, no murmurs. No carotid bruits, no JVD Abdomen: soft, positive BS, non-tender, non-distended, no organomegaly, not an acute abdomen GU: not examined Neuro: Dysarthria, facial muscle contraction, strength 5/5 B/L upper in the extremities.  Sensation intact Musculoskeletal: strength 5/5 all extremities, no clubbing, cyanosis or edema Skin: no rash, no subcutaneous crepitation, no decubitus Psych: Alert and oriented, anxious patient   Labs on Admission:  Recent Labs    06/30/20 1637 06/30/20 1701  NA 139 142  K 3.9 3.9  CL 103 105  CO2 27  --   GLUCOSE 102* 97  BUN 9 12  CREATININE 0.80 0.80  CALCIUM 9.9  --    Recent Labs    06/30/20 1637  AST 23  ALT 23  ALKPHOS 61  BILITOT 0.4  PROT 7.5  ALBUMIN 4.3   No results for input(s): LIPASE, AMYLASE in the last 72 hours. Recent Labs    06/30/20 1637 06/30/20 1701  WBC 10.1  --   NEUTROABS 4.6  --   HGB 12.2 13.3  HCT 38.8 39.0  MCV 91.3  --   PLT 340  --    No results for input(s): CKTOTAL, CKMB, CKMBINDEX, TROPONINI in the last 72 hours. Invalid input(s): POCBNP No results for input(s): DDIMER in the last 72 hours. No results for input(s): HGBA1C in the last 72 hours. No results for input(s): CHOL, HDL, LDLCALC, TRIG, CHOLHDL, LDLDIRECT in the last 72 hours. No results for input(s): TSH, T4TOTAL, T3FREE, THYROIDAB in the last 72 hours.  Invalid input(s): FREET3 No results for input(s): VITAMINB12, FOLATE, FERRITIN, TIBC, IRON, RETICCTPCT in the last 72 hours.  Micro Results: No results found for this or any previous visit (from the past 240 hour(s)).   Radiological Exams on Admission: MR ANGIO HEAD WO CONTRAST  Result Date: 06/30/2020 CLINICAL DATA:  59 year old female code stroke presentation with weakness and tremor. EXAM: MRI HEAD WITHOUT CONTRAST MRA HEAD WITHOUT CONTRAST  TECHNIQUE: Multiplanar, multiecho pulse sequences of the brain and surrounding structures were obtained without intravenous contrast. Angiographic images of the head were obtained using MRA technique without contrast. COMPARISON:  Head CT earlier today.  Brain MRI 11/15/2016 FINDINGS: MRI  HEAD FINDINGS Brain: No restricted diffusion to suggest acute infarction. No midline shift, mass effect, evidence of mass lesion, ventriculomegaly, extra-axial collection or acute intracranial hemorrhage. Cervicomedullary junction and pituitary are within normal limits. Cerebral volume remains normal for age. Pearline Cables and white matter signal remains within normal limits for age. No convincing encephalomalacia. No chronic cerebral blood products. Vascular: Major intracranial vascular flow voids are stable. Skull and upper cervical spine: Visible bone marrow signal is stable and within normal limits. Partially visible hardware artifact from cervical ACDF. Sinuses/Orbits: Negative. Other: Mastoids remain clear. Grossly negative internal auditory structures. MRA HEAD FINDINGS Mild motion artifact. Antegrade flow in the posterior circulation with fairly codominant distal vertebral arteries. No distal vertebral artery or basilar artery stenosis. Motion artifact at the basilar tip. Patent SCA and PCA origins. Left posterior communicating artery is present. Bilateral PCA branches are within normal limits. Antegrade flow in both ICA siphons. Tortuous cavernous ICAs. No siphon stenosis identified. Normal left posterior communicating artery origin. Ophthalmic artery origins appear within normal limits. Patent carotid termini. Normal MCA and ACA origins. Diminutive or absent anterior communicating artery. MCA M1 segments and bifurcations are patent without stenosis. Some MCA and ACA branch detail is obscured. No branch occlusion identified. IMPRESSION: 1. No acute intracranial abnormality. Stable and normal for age noncontrast MRI appearance of  the brain. 2. Intracranial MRA is negative when allowing for some motion artifact. Electronically Signed   By: Genevie Ann M.D.   On: 06/30/2020 19:10   MR BRAIN WO CONTRAST  Result Date: 06/30/2020 CLINICAL DATA:  59 year old female code stroke presentation with weakness and tremor. EXAM: MRI HEAD WITHOUT CONTRAST MRA HEAD WITHOUT CONTRAST TECHNIQUE: Multiplanar, multiecho pulse sequences of the brain and surrounding structures were obtained without intravenous contrast. Angiographic images of the head were obtained using MRA technique without contrast. COMPARISON:  Head CT earlier today.  Brain MRI 11/15/2016 FINDINGS: MRI HEAD FINDINGS Brain: No restricted diffusion to suggest acute infarction. No midline shift, mass effect, evidence of mass lesion, ventriculomegaly, extra-axial collection or acute intracranial hemorrhage. Cervicomedullary junction and pituitary are within normal limits. Cerebral volume remains normal for age. Pearline Cables and white matter signal remains within normal limits for age. No convincing encephalomalacia. No chronic cerebral blood products. Vascular: Major intracranial vascular flow voids are stable. Skull and upper cervical spine: Visible bone marrow signal is stable and within normal limits. Partially visible hardware artifact from cervical ACDF. Sinuses/Orbits: Negative. Other: Mastoids remain clear. Grossly negative internal auditory structures. MRA HEAD FINDINGS Mild motion artifact. Antegrade flow in the posterior circulation with fairly codominant distal vertebral arteries. No distal vertebral artery or basilar artery stenosis. Motion artifact at the basilar tip. Patent SCA and PCA origins. Left posterior communicating artery is present. Bilateral PCA branches are within normal limits. Antegrade flow in both ICA siphons. Tortuous cavernous ICAs. No siphon stenosis identified. Normal left posterior communicating artery origin. Ophthalmic artery origins appear within normal limits. Patent  carotid termini. Normal MCA and ACA origins. Diminutive or absent anterior communicating artery. MCA M1 segments and bifurcations are patent without stenosis. Some MCA and ACA branch detail is obscured. No branch occlusion identified. IMPRESSION: 1. No acute intracranial abnormality. Stable and normal for age noncontrast MRI appearance of the brain. 2. Intracranial MRA is negative when allowing for some motion artifact. Electronically Signed   By: Genevie Ann M.D.   On: 06/30/2020 19:10   CT HEAD CODE STROKE WO CONTRAST  Result Date: 06/30/2020 CLINICAL DATA:  Code stroke. 59 year old female with weakness  and tremor. EXAM: CT HEAD WITHOUT CONTRAST TECHNIQUE: Contiguous axial images were obtained from the base of the skull through the vertex without intravenous contrast. COMPARISON:  Head CT 11/15/2016.  Brain MRI 11/15/2016. FINDINGS: Brain: Cerebral volume remains normal. No midline shift, ventriculomegaly, mass effect, evidence of mass lesion, intracranial hemorrhage or evidence of cortically based acute infarction. Gray-white matter differentiation is within normal limits throughout the brain. No encephalomalacia identified. Vascular: Mild Calcified atherosclerosis at the skull base. No suspicious intracranial vascular hyperdensity. Skull: Negative. Sinuses/Orbits: Visualized paranasal sinuses and mastoids are stable and well pneumatized. Other: No acute orbit or scalp soft tissue findings. ASPECTS [facility] Stroke Program Early CT Score) Total score (0-10 with 10 being normal): 10 IMPRESSION: 1. Stable and normal noncontrast CT appearance of the brain. ASPECTS 10. 2. These results were communicated to Dr. Cheral Marker at 5:03 pm on 06/30/2020 by text page via the [facility] messaging system. Electronically Signed   By: Genevie Ann M.D.   On: 06/30/2020 17:03    Assessment/Plan Present on Admission: . Adult onset stuttering/Anxiety state/migraine -Bring for overnight observation admit telemetry -Neurology has seen  patient, work-up so far in the ER is negative.  CT head, MRI and MRA head and neck normal -We will bring patient in for TIA work-up, neurochecks, carotid ultrasounds, monitoring on telemetry -PT/OT consult placed, carotid ultrasounds -Aspirin daily -Patient has been treated for atypical migraine in the ER.  We will continue to monitor for now.  . Anxiety and depression -Xanax ordered as needed  . Essential hypertension -Stable, home meds resumed  . Dyslipidemia -Stable, home meds resumed   Blimy Napoleon 06/30/2020, 7:22 PM

## 2020-07-01 ENCOUNTER — Observation Stay (HOSPITAL_COMMUNITY): Payer: PPO

## 2020-07-01 ENCOUNTER — Observation Stay (HOSPITAL_BASED_OUTPATIENT_CLINIC_OR_DEPARTMENT_OTHER): Payer: PPO

## 2020-07-01 DIAGNOSIS — Z87891 Personal history of nicotine dependence: Secondary | ICD-10-CM | POA: Diagnosis not present

## 2020-07-01 DIAGNOSIS — F419 Anxiety disorder, unspecified: Secondary | ICD-10-CM

## 2020-07-01 DIAGNOSIS — G459 Transient cerebral ischemic attack, unspecified: Secondary | ICD-10-CM

## 2020-07-01 DIAGNOSIS — R110 Nausea: Secondary | ICD-10-CM | POA: Diagnosis not present

## 2020-07-01 DIAGNOSIS — Z7984 Long term (current) use of oral hypoglycemic drugs: Secondary | ICD-10-CM | POA: Diagnosis not present

## 2020-07-01 DIAGNOSIS — Z79899 Other long term (current) drug therapy: Secondary | ICD-10-CM | POA: Diagnosis not present

## 2020-07-01 DIAGNOSIS — Z20822 Contact with and (suspected) exposure to covid-19: Secondary | ICD-10-CM | POA: Diagnosis not present

## 2020-07-01 DIAGNOSIS — R079 Chest pain, unspecified: Secondary | ICD-10-CM | POA: Diagnosis not present

## 2020-07-01 DIAGNOSIS — F985 Adult onset fluency disorder: Secondary | ICD-10-CM | POA: Diagnosis not present

## 2020-07-01 DIAGNOSIS — E119 Type 2 diabetes mellitus without complications: Secondary | ICD-10-CM | POA: Diagnosis not present

## 2020-07-01 DIAGNOSIS — I10 Essential (primary) hypertension: Secondary | ICD-10-CM | POA: Diagnosis not present

## 2020-07-01 DIAGNOSIS — E785 Hyperlipidemia, unspecified: Secondary | ICD-10-CM | POA: Diagnosis not present

## 2020-07-01 DIAGNOSIS — F329 Major depressive disorder, single episode, unspecified: Secondary | ICD-10-CM | POA: Diagnosis not present

## 2020-07-01 DIAGNOSIS — M4802 Spinal stenosis, cervical region: Secondary | ICD-10-CM | POA: Diagnosis not present

## 2020-07-01 DIAGNOSIS — M47812 Spondylosis without myelopathy or radiculopathy, cervical region: Secondary | ICD-10-CM | POA: Diagnosis not present

## 2020-07-01 LAB — TSH: TSH: 1.077 u[IU]/mL (ref 0.350–4.500)

## 2020-07-01 LAB — BASIC METABOLIC PANEL
Anion gap: 11 (ref 5–15)
BUN: 10 mg/dL (ref 6–20)
CO2: 23 mmol/L (ref 22–32)
Calcium: 9.5 mg/dL (ref 8.9–10.3)
Chloride: 107 mmol/L (ref 98–111)
Creatinine, Ser: 0.8 mg/dL (ref 0.44–1.00)
GFR calc Af Amer: >60 mL/min (ref >60)
GFR calc non Af Amer: >60 mL/min (ref >60)
Glucose, Bld: 105 mg/dL — ABNORMAL HIGH (ref 70–99)
Potassium: 3.7 mmol/L (ref 3.5–5.1)
Sodium: 141 mmol/L (ref 135–145)

## 2020-07-01 LAB — ECHOCARDIOGRAM COMPLETE
Area-P 1/2: 2.91 cm2
Height: 65 in
S' Lateral: 3.3 cm
Weight: 4095.26 oz

## 2020-07-01 LAB — GLUCOSE, CAPILLARY
Glucose-Capillary: 110 mg/dL — ABNORMAL HIGH (ref 70–99)
Glucose-Capillary: 98 mg/dL (ref 70–99)
Glucose-Capillary: 115 mg/dL — ABNORMAL HIGH (ref 70–99)
Glucose-Capillary: 106 mg/dL — ABNORMAL HIGH (ref 70–99)

## 2020-07-01 LAB — LIPID PANEL
Cholesterol: 281 mg/dL — ABNORMAL HIGH (ref 0–200)
HDL: 44 mg/dL (ref >40)
LDL Cholesterol: 202 mg/dL — ABNORMAL HIGH (ref 0–99)
Total CHOL/HDL Ratio: 6.4 RATIO
Triglycerides: 175 mg/dL — ABNORMAL HIGH (ref <150)
VLDL: 35 mg/dL (ref 0–40)

## 2020-07-01 LAB — MAGNESIUM: Magnesium: 2.1 mg/dL (ref 1.7–2.4)

## 2020-07-01 LAB — HEMOGLOBIN A1C
Hgb A1c MFr Bld: 5.3 % (ref 4.8–5.6)
Mean Plasma Glucose: 105.41 mg/dL

## 2020-07-01 LAB — CBC
HCT: 36.7 % (ref 36.0–46.0)
Hemoglobin: 11.7 g/dL — ABNORMAL LOW (ref 12.0–15.0)
MCH: 29 pg (ref 26.0–34.0)
MCHC: 31.9 g/dL (ref 30.0–36.0)
MCV: 91.1 fL (ref 80.0–100.0)
Platelets: 332 10*3/uL (ref 150–400)
RBC: 4.03 MIL/uL (ref 3.87–5.11)
RDW: 11.9 % (ref 11.5–15.5)
WBC: 9.4 10*3/uL (ref 4.0–10.5)
nRBC: 0 % (ref 0.0–0.2)

## 2020-07-01 LAB — SEDIMENTATION RATE: Sed Rate: 17 mm/hr (ref 0–22)

## 2020-07-01 LAB — C-REACTIVE PROTEIN: CRP: 0.7 mg/dL (ref <1.0)

## 2020-07-01 MED ORDER — LORAZEPAM 2 MG/ML IJ SOLN
1.0000 mg | Freq: Once | INTRAMUSCULAR | Status: AC
  Administered 2020-07-01: 1 mg via INTRAVENOUS
  Filled 2020-07-01: qty 1

## 2020-07-01 MED ORDER — DICYCLOMINE HCL 10 MG PO CAPS: 10.0000 mg | ORAL_CAPSULE | ORAL | Status: DC

## 2020-07-01 MED ORDER — DICYCLOMINE HCL 10 MG PO CAPS: 10.0000 mg | ORAL_CAPSULE | Freq: Every day | ORAL | Status: DC | PRN

## 2020-07-01 MED ORDER — METHOCARBAMOL 1000 MG/10ML IJ SOLN
500.0000 mg | Freq: Three times a day (TID) | INTRAVENOUS | Status: DC | PRN
  Filled 2020-07-01 (×2): qty 5

## 2020-07-01 MED ORDER — PHENOL 1.4 % MT LIQD
1.0000 | OROMUCOSAL | Status: DC | PRN
  Administered 2020-07-01: 1 via OROMUCOSAL
  Filled 2020-07-01: qty 177

## 2020-07-01 MED ORDER — SODIUM CHLORIDE 0.9 % IV SOLN
500.0000 mg | INTRAVENOUS | Status: AC
  Administered 2020-07-01 – 2020-07-02 (×2): 500 mg via INTRAVENOUS
  Filled 2020-07-01 (×3): qty 4

## 2020-07-01 MED ORDER — DICYCLOMINE HCL 10 MG PO CAPS
10.0000 mg | ORAL_CAPSULE | Freq: Every day | ORAL | Status: DC
  Administered 2020-07-01: 10 mg via ORAL
  Filled 2020-07-01: qty 1

## 2020-07-01 MED ORDER — ROSUVASTATIN CALCIUM 20 MG PO TABS
40.0000 mg | ORAL_TABLET | Freq: Every day | ORAL | Status: DC
  Administered 2020-07-02: 40 mg via ORAL
  Filled 2020-07-01: qty 2

## 2020-07-01 MED ORDER — METHOCARBAMOL 500 MG PO TABS
500.0000 mg | ORAL_TABLET | Freq: Four times a day (QID) | ORAL | Status: DC | PRN
  Administered 2020-07-01: 500 mg via ORAL
  Filled 2020-07-01: qty 1

## 2020-07-01 NOTE — Progress Notes (Signed)
VASCULAR LAB    Carotid duplex completed.    Preliminary report:  See CV proc for preliminary results.  Mariadejesus Cade, RVT 07/01/2020, 10:49 AM

## 2020-07-01 NOTE — Evaluation (Signed)
Physical Therapy Evaluation Patient Details Name: Paula [name] MRN: [id number] DOB: Dec 12, 1960 Today's Date: 07/01/2020   History of Present Illness  59 yo female admitted  with  stuttering of speech L hand tingling. pt with workup for TIA currently PMH OSA HTn HLD migraines Fibromyalgia anxiety panic attacks depression diverticulitis IBS psychogenic tremor DM2 obese  Clinical Impression  Pt is not at baseline functioning, but should be safe at home independently until she can find out the cause of her problems. There are no further acute PT needs.  Will sign off at this time.     Follow Up Recommendations No PT follow up    Equipment Recommendations  None recommended by PT    Recommendations for Other Services       Precautions / Restrictions Precautions Precautions: Fall      Mobility  Bed Mobility Overal bed mobility: Needs Assistance Bed Mobility: Supine to Sit;Rolling Rolling: Supervision   Supine to sit: Supervision     General bed mobility comments: pt with heavy use of bed rail with R UE on side and then reaching due to L UE deficits  Transfers Overall transfer level: Needs assistance Equipment used: Rolling walker (2 wheeled) Transfers: Sit to/from Stand Sit to Stand: Min assist;Supervision         General transfer comment: mod cues for safety with RW  Ambulation/Gait Ambulation/Gait assistance: Supervision Gait Distance (Feet): 600 Feet Assistive device: None Gait Pattern/deviations: Step-through pattern Gait velocity: generally age appropriate Gait velocity interpretation: >2.62 ft/sec, indicative of community ambulatory General Gait Details: generally steady with episodes on scissor or drift to recover from mild deviation when scanning or abruptly changing direction.  Stairs Stairs: Yes Stairs assistance: Supervision Stair Management: One rail Right;Alternating pattern;Step to pattern;Forwards Number of Stairs: 5 General stair comments:  needs rail for safety presently,  showed that she could go down with alternating steps, but we discussed that she should use step to with her bad right knee.  Wheelchair Mobility    Modified Rankin (Stroke Patients Only)       Balance Overall balance assessment: Mild deficits observed, not formally tested                               Standardized Balance Assessment Standardized Balance Assessment : Berg Balance Test;Dynamic Gait Index Berg Balance Test Sit to Stand: Able to stand  independently using hands Standing Unsupported: Able to stand 2 minutes with supervision Sitting with Back Unsupported but Feet Supported on Floor or Stool: Able to sit safely and securely 2 minutes Stand to Sit: Controls descent by using hands Transfers: Able to transfer safely, minor use of hands Standing Unsupported with Eyes Closed: Able to stand 10 seconds with supervision Standing Ubsupported with Feet Together: Able to place feet together independently and stand for 1 minute with supervision From Standing, Reach Forward with Outstretched Arm: Can reach confidently >25 cm (10") From Standing Position, Pick up Object from Floor: Able to pick up shoe, needs supervision From Standing Position, Turn to Look Behind Over each Shoulder: Looks behind from both sides and weight shifts well Turn 360 Degrees: Able to turn 360 degrees safely in 4 seconds or less Standing Unsupported, Alternately Place Feet on Step/Stool: Able to stand independently and complete 8 steps >20 seconds Standing Unsupported, One Foot in Front: Able to plae foot ahead of the other independently and hold 30 seconds Standing on One Leg: Able to lift leg  independently and hold 5-10 seconds Total Score: 47 Dynamic Gait Index Level Surface: Normal Change in Gait Speed: Normal Gait with Horizontal Head Turns: Mild Impairment Gait with Vertical Head Turns: Normal Gait and Pivot Turn: Normal Step Over Obstacle: Normal Step  Around Obstacles: Mild Impairment Steps: Mild Impairment Total Score: 21       Pertinent Vitals/Pain Pain Assessment: Faces Faces Pain Scale: Hurts a little bit Pain Location: head Pain Descriptors / Indicators: Headache    Home Living Family/patient expects to be discharged to:: Private residence Living Arrangements: Spouse/significant other Available Help at Discharge: Family;Available PRN/intermittently Type of Home: House Home Access: Stairs to enter Entrance Stairs-Rails: None Entrance Stairs-Number of Steps: 1 Home Layout: One level Home Equipment: Shower seat;Hand held shower head Additional Comments: has fish at home to care for    Prior Function Level of Independence: Independent         Comments: [place]     [place]: Right    Extremity/Trunk Assessment        Lower Extremity Assessment Lower Extremity Assessment: Overall WFL for tasks assessed    Cervical / Trunk Assessment Cervical / Trunk Assessment: Normal  Communication   Communication: Expressive difficulties  Cognition Arousal/Alertness: Awake/alert Behavior During Therapy: WFL for tasks assessed/performed Overall Cognitive Status: Within Functional Limits for tasks assessed                                        General Comments General comments (skin integrity, edema, etc.): Discussed that pt should be safe and independent in her home, but should use cane or should be accompnied by some when out on unever ground.    Exercises     Assessment/Plan    PT Assessment Patient needs continued PT services  PT Problem List Decreased balance       PT Treatment Interventions      PT Goals (Current goals can be found in the Care Plan section)  Acute Rehab PT Goals Patient Stated Goal: to get better PT Goal Formulation: All assessment and education complete, DC therapy    Frequency     Barriers to discharge         Co-evaluation               AM-PAC PT "6 Clicks" Mobility  Outcome Measure Help needed turning from your back to your side while in a flat bed without using bedrails?: None Help needed moving from lying on your back to sitting on the side of a flat bed without using bedrails?: None Help needed moving to and from a bed to a chair (including a wheelchair)?: None Help needed standing up from a chair using your arms (e.g., wheelchair or bedside chair)?: None Help needed to walk in hospital room?: None Help needed climbing 3-5 steps with a railing? : None 6 Click Score: 24    End of Session   Activity Tolerance: Patient tolerated treatment well Patient left: Other (comment) (standing talking to the MD) Nurse Communication: Mobility status PT Visit Diagnosis: Other abnormalities of gait and mobility (R26.89)    Time: 3016-0109 PT Time Calculation (min) (ACUTE ONLY): 22 min   Charges:   PT Evaluation $PT Eval Low Complexity: 1 Low          07/01/2020  Ginger Carne., PT Acute Rehabilitation Services [phone]  (pager) [phone]  (office)  Tessie Fass Atara Paterson 07/01/2020, 5:02 PM

## 2020-07-01 NOTE — Progress Notes (Signed)
Progress Note    Paula [name]  WCH:[id number] DOB: February 08, 1961  DOA: 06/30/2020 PCP: Carol Ada, MD    Brief Narrative:     Medical records reviewed and are as summarized below:  Paula [name] is an 59 y.o. female who states that she has had a temporal headache for the past couple of weeks.  It eventually moved to the back of her head on the left and stayed.  She had pain started in the back of her shoulder and her arm.  She started feeling really badly and she went and saw her MD today.  While her MD was with eye exam, she developed watery eyes, then double vision.  She started stretching, her fingers were tingling and numb and her face appeared to become frozen around her small.  She had palpitations, nausea and was very anxious.  She also reported that she had left arm pains.  Her PCP called 911 and sent her into the ER as a code stroke.  She also reports she had some fleeting chest pains that have now resolved.  Patient states she has had speech impediment/stuttering before but she has never had it associated with this facial asymmetry.  Assessment/Plan:   Principal Problem:   Adult onset stuttering Active Problems:   Dyslipidemia   Anxiety state   Essential hypertension   OSA on CPAP   Coarse tremors   Depression, major   Chronic pain syndrome   Migraine   Fibromyalgia   Type 2 diabetes mellitus without complication (HCC)   TIA (transient ischemic attack)    Adult onset stuttering/Anxiety state/migraine/neck pain - CT head, MRI and MRA head and neck normal -echo/carotids unremarkable -PT/OT no follow up with PT -MRI neck with: Mild progression of degenerative changes at the C3-4 level with moderate canal stenosis and mild right foraminal stenosis-- discussed with neurology, steroids IV x 1 -patient states she is under a lot of stress-- husband has PTSD, her mother lives with her and 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   Family Communication/Anticipated D/C date and plan/Code Status   DVT prophylaxis: Lovenox ordered. Code Status: Full Code.  Disposition Plan: Status is: Observation  The patient will require care spanning > 2 midnights and should be moved to inpatient because: Ongoing active pain requiring inpatient pain management  Dispo: The patient is from: Home              Anticipated d/c is to: Home              Anticipated d/c date is: 1 day              Patient currently is not medically stable to d/c.         Medical Consultants:    neurology     Subjective:   Relieved that her imaging did not show anything serious  Objective:    Vitals:   07/01/20 0041 07/01/20 0105 07/01/20 0737 07/01/20 1216  BP:  (!) 121/99 (!) 129/68 (!) 133/73  Pulse:  64 59 66  Resp:  16 18 16   Temp: 97.8 F (36.6 C) 98 F (36.7 C) 98 F (36.7 C) 98 F (36.7 C)  TempSrc: Oral Oral Oral Oral  SpO2:  100% 97% 99%  Weight:      Height:       No intake or output data in the 24 hours ending 07/01/20 1700 Filed Weights   06/30/20 1636  Weight: (!) 116.1 kg    Exam:  General: Appearance:    Severely obese female in no acute distress     Lungs:     Clear to auscultation bilaterally, respirations unlabored  Heart:    Normal heart rate. Normal rhythm. No murmurs, rubs, or gallops.   MS:   All extremities are intact. Tenderness with palpation of muscles in shoulder/neck  Neurologic:   Awake, alert, oriented x 3.     Data Reviewed:   I have personally reviewed following labs and imaging studies:  Labs: Labs show the following:   Basic Metabolic Panel: Recent Labs  Lab 06/30/20 1637 06/30/20 1637 06/30/20 1701 07/01/20 0206  NA 139  --  142 141  K 3.9   < > 3.9 3.7  CL 103  --  105 107  CO2  27  --   --  23  GLUCOSE 102*  --  97 105*  BUN 9  --  12 10  CREATININE 0.80  --  0.80 0.80  CALCIUM 9.9  --   --  9.5  MG  --   --   --  2.1   < > = values in this interval not displayed.   GFR Estimated Creatinine Clearance: 95.2 mL/min (by C-G formula based on SCr of 0.8 mg/dL). Liver Function Tests: Recent Labs  Lab 06/30/20 1637  AST 23  ALT 23  ALKPHOS 61  BILITOT 0.4  PROT 7.5  ALBUMIN 4.3   No results for input(s): LIPASE, AMYLASE in the last 168 hours. No results for input(s): AMMONIA in the last 168 hours. Coagulation profile Recent Labs  Lab 06/30/20 1637  INR 1.0    CBC: Recent Labs  Lab 06/30/20 1637 06/30/20 1701 07/01/20 0206  WBC 10.1  --  9.4  NEUTROABS 4.6  --   --   HGB 12.2 13.3 11.7*  HCT 38.8 39.0 36.7  MCV 91.3  --  91.1  PLT 340  --  332   Cardiac Enzymes: No results for input(s): CKTOTAL, CKMB, CKMBINDEX, TROPONINI in the last 168 hours. BNP (last 3 results) No results for input(s): PROBNP in the last 8760 hours. CBG: Recent Labs  Lab 06/30/20 1750 06/30/20 2253 07/01/20 0629 07/01/20 1053 07/01/20 1604  GLUCAP 88 86 110* 98 115*   D-Dimer: No results for input(s): DDIMER in the last 72 hours. Hgb A1c: Recent Labs    07/01/20 0206  HGBA1C 5.3   Lipid Profile: Recent Labs    07/01/20 0206  CHOL 281*  HDL 44  LDLCALC 202*  TRIG 175*  CHOLHDL 6.4   Thyroid function studies: Recent Labs    07/01/20 1309  TSH 1.077   Anemia work up: No results for input(s): VITAMINB12, FOLATE, FERRITIN, TIBC, IRON, RETICCTPCT in the last 72 hours. Sepsis Labs: Recent Labs  Lab 06/30/20 1637 07/01/20 0206  WBC 10.1 9.4    Microbiology Recent Results (from the past 240 hour(s))  SARS Coronavirus 2 by RT PCR (hospital order, performed in [facility] hospital lab) Nasopharyngeal Nasopharyngeal Swab     Status: None   Collection Time: 06/30/20  8:06 PM   Specimen: Nasopharyngeal Swab  Result Value Ref Range Status   SARS  Coronavirus 2 NEGATIVE NEGATIVE Final    Comment: (NOTE) SARS-CoV-2 target  nucleic acids are NOT DETECTED.  The SARS-CoV-2 RNA is generally detectable in upper and lower respiratory specimens during the acute phase of infection. The lowest concentration of SARS-CoV-2 viral copies this assay can detect is 250 copies / mL. A negative result does not preclude SARS-CoV-2 infection and should not be used as the sole basis for treatment or other patient management decisions.  A negative result may occur with improper specimen collection / handling, submission of specimen other than nasopharyngeal swab, presence of viral mutation(s) within the areas targeted by this assay, and inadequate number of viral copies (<250 copies / mL). A negative result must be combined with clinical observations, patient history, and epidemiological information.  Fact Sheet for Patients:   StrictlyIdeas.no  Fact Sheet for Healthcare Providers: BankingDealers.co.za  This test is not yet approved or  cleared by the [place] FDA and has been authorized for detection and/or diagnosis of SARS-CoV-2 by FDA under an Emergency Use Authorization (EUA).  This EUA will remain in effect (meaning this test can be used) for the duration of the COVID-19 declaration under Section 564(b)(1) of the Act, 21 U.S.C. section 360bbb-3(b)(1), unless the authorization is terminated or revoked sooner.  Performed at [facility], [place] [address], [place], [place] [address]     Procedures and diagnostic studies:  MR ANGIO HEAD WO CONTRAST  Result Date: 06/30/2020 CLINICAL DATA:  59 year old female code stroke presentation with weakness and tremor. EXAM: MRI HEAD WITHOUT CONTRAST MRA HEAD WITHOUT CONTRAST TECHNIQUE: Multiplanar, multiecho pulse sequences of the brain and surrounding structures were obtained without intravenous contrast. Angiographic images of the head were  obtained using MRA technique without contrast. COMPARISON:  Head CT earlier today.  Brain MRI 11/15/2016 FINDINGS: MRI HEAD FINDINGS Brain: No restricted diffusion to suggest acute infarction. No midline shift, mass effect, evidence of mass lesion, ventriculomegaly, extra-axial collection or acute intracranial hemorrhage. Cervicomedullary junction and pituitary are within normal limits. Cerebral volume remains normal for age. [place] Cables and white matter signal remains within normal limits for age. No convincing encephalomalacia. No chronic cerebral blood products. Vascular: Major intracranial vascular flow voids are stable. Skull and upper cervical spine: Visible bone marrow signal is stable and within normal limits. Partially visible hardware artifact from cervical ACDF. Sinuses/Orbits: Negative. Other: Mastoids remain clear. Grossly negative internal auditory structures. MRA HEAD FINDINGS Mild motion artifact. Antegrade flow in the posterior circulation with fairly codominant distal vertebral arteries. No distal vertebral artery or basilar artery stenosis. Motion artifact at the basilar tip. Patent SCA and PCA origins. Left posterior communicating artery is present. Bilateral PCA branches are within normal limits. Antegrade flow in both ICA siphons. Tortuous cavernous ICAs. No siphon stenosis identified. Normal left posterior communicating artery origin. Ophthalmic artery origins appear within normal limits. Patent carotid termini. Normal MCA and ACA origins. Diminutive or absent anterior communicating artery. MCA M1 segments and bifurcations are patent without stenosis. Some MCA and ACA branch detail is obscured. No branch occlusion identified. IMPRESSION: 1. No acute intracranial abnormality. Stable and normal for age noncontrast MRI appearance of the brain. 2. Intracranial MRA is negative when allowing for some motion artifact. Electronically Signed   By: Genevie Ann M.D.   On: 06/30/2020 19:10   MR BRAIN WO  CONTRAST  Result Date: 06/30/2020 CLINICAL DATA:  59 year old female code stroke presentation with weakness and tremor. EXAM: MRI HEAD WITHOUT CONTRAST MRA HEAD WITHOUT CONTRAST TECHNIQUE: Multiplanar, multiecho pulse sequences of the brain and surrounding structures were obtained without intravenous contrast. Angiographic  images of the head were obtained using MRA technique without contrast. COMPARISON:  Head CT earlier today.  Brain MRI 11/15/2016 FINDINGS: MRI HEAD FINDINGS Brain: No restricted diffusion to suggest acute infarction. No midline shift, mass effect, evidence of mass lesion, ventriculomegaly, extra-axial collection or acute intracranial hemorrhage. Cervicomedullary junction and pituitary are within normal limits. Cerebral volume remains normal for age. [place] Cables and white matter signal remains within normal limits for age. No convincing encephalomalacia. No chronic cerebral blood products. Vascular: Major intracranial vascular flow voids are stable. Skull and upper cervical spine: Visible bone marrow signal is stable and within normal limits. Partially visible hardware artifact from cervical ACDF. Sinuses/Orbits: Negative. Other: Mastoids remain clear. Grossly negative internal auditory structures. MRA HEAD FINDINGS Mild motion artifact. Antegrade flow in the posterior circulation with fairly codominant distal vertebral arteries. No distal vertebral artery or basilar artery stenosis. Motion artifact at the basilar tip. Patent SCA and PCA origins. Left posterior communicating artery is present. Bilateral PCA branches are within normal limits. Antegrade flow in both ICA siphons. Tortuous cavernous ICAs. No siphon stenosis identified. Normal left posterior communicating artery origin. Ophthalmic artery origins appear within normal limits. Patent carotid termini. Normal MCA and ACA origins. Diminutive or absent anterior communicating artery. MCA M1 segments and bifurcations are patent without stenosis. Some  MCA and ACA branch detail is obscured. No branch occlusion identified. IMPRESSION: 1. No acute intracranial abnormality. Stable and normal for age noncontrast MRI appearance of the brain. 2. Intracranial MRA is negative when allowing for some motion artifact. Electronically Signed   By: Genevie Ann M.D.   On: 06/30/2020 19:10   MR CERVICAL SPINE WO CONTRAST  Result Date: 07/01/2020 CLINICAL DATA:  Upper extremity numbness and tingling. EXAM: MRI CERVICAL SPINE WITHOUT CONTRAST TECHNIQUE: Multiplanar, multisequence MR imaging of the cervical spine was performed. No intravenous contrast was administered. COMPARISON:  CT 05/02/2018.  MRI 02/02/2016 FINDINGS: Alignment: 2 mm anterolisthesis C7 on T1. Straightening of the cervical lordosis. Vertebrae: Postsurgical changes from prior decompression and anterior fusion with corpectomy and strut graft at the C4-C7 levels with evidence of solid fusion. No evidence of fracture, discitis, or suspicious bone lesion. Low signal ossification of the posterior longitudinal ligament at the C2 and C2-3 level, better seen on prior CT. Cord: Normal signal and morphology. Posterior Fossa, vertebral arteries, paraspinal tissues: Negative. Disc levels: C2-C3: Small posterior disc osteophyte complex. Minimal bilateral facet and uncovertebral arthropathy. No foraminal or canal stenosis. Unchanged. C3-C4: Right paracentral disc osteophyte complex contacts and slightly deforms the ventral cord resulting in moderate canal stenosis. No associated cord signal abnormality. Minimal facet and uncovertebral arthropathy with mild right foraminal stenosis. Findings have mildly progressed from prior MRI. C4-C5 through C6-C7: Prior decompression and fusion. Canal and bilateral foramina are widely patent at all levels. Unchanged. C7-T1: Small disc osteophyte complex with mild bilateral facet arthropathy. No foraminal or canal stenosis. Unchanged. IMPRESSION: 1. Mild progression of degenerative changes at  the C3-4 level with moderate canal stenosis and mild right foraminal stenosis. 2. Prior decompression and fusion at C4-C7 with no residual foraminal or canal stenosis at the fused levels. Electronically Signed   By: Davina Poke D.O.   On: 07/01/2020 16:17   ECHOCARDIOGRAM COMPLETE  Result Date: 07/01/2020    ECHOCARDIOGRAM REPORT   Patient Name:   Paula [name] Date of Exam: 07/01/2020 Medical Rec #:  [id number]       Height:       65.0 in Accession #:    [id number]  Weight:       256.0 lb Date of Birth:  [birth year]       BSA:          2.197 m Patient Age:    59 years        BP:           129/68 mmHg Patient Gender: F               HR:           63 bpm. Exam Location:  Inpatient Procedure: 2D Echo Indications:    TIA 435.9  History:        Patient has prior history of Echocardiogram examinations, most                 recent 07/16/2018. Risk Factors:Sleep Apnea, Hypertension,                 Dyslipidemia and Diabetes.  Sonographer:    Johny Chess Referring Phys: [place]  1. Left ventricular ejection fraction, by estimation, is 55 to 60%. The left ventricle has normal function. The left ventricle has no regional wall motion abnormalities. Left ventricular diastolic parameters are consistent with Grade I diastolic dysfunction (impaired relaxation).  2. Right ventricular systolic function is normal. The right ventricular size is normal. There is normal pulmonary artery systolic pressure. The estimated right ventricular systolic pressure is 83.6 mmHg.  3. The mitral valve is normal in structure. No evidence of mitral valve regurgitation. No evidence of mitral stenosis.  4. The aortic valve is normal in structure. Aortic valve regurgitation is not visualized. No aortic stenosis is present.  5. The inferior vena cava is normal in size with greater than 50% respiratory variability, suggesting right atrial pressure of 3 mmHg. Conclusion(s)/Recommendation(s): No intracardiac source of  embolism detected on this transthoracic study. A transesophageal echocardiogram is recommended to exclude cardiac source of embolism if clinically indicated. FINDINGS  Left Ventricle: Left ventricular ejection fraction, by estimation, is 55 to 60%. The left ventricle has normal function. The left ventricle has no regional wall motion abnormalities. The left ventricular internal cavity size was normal in size. There is  no left ventricular hypertrophy. Left ventricular diastolic parameters are consistent with Grade I diastolic dysfunction (impaired relaxation). Right Ventricle: The right ventricular size is normal. No increase in right ventricular wall thickness. Right ventricular systolic function is normal. There is normal pulmonary artery systolic pressure. The tricuspid regurgitant velocity is 2.19 m/s, and  with an assumed right atrial pressure of 3 mmHg, the estimated right ventricular systolic pressure is 62.9 mmHg. Left Atrium: Left atrial size was normal in size. Right Atrium: Right atrial size was normal in size. Pericardium: There is no evidence of pericardial effusion. Mitral Valve: The mitral valve is normal in structure. Normal mobility of the mitral valve leaflets. No evidence of mitral valve regurgitation. No evidence of mitral valve stenosis. Tricuspid Valve: The tricuspid valve is normal in structure. Tricuspid valve regurgitation is trivial. No evidence of tricuspid stenosis. Aortic Valve: The aortic valve is normal in structure. Aortic valve regurgitation is not visualized. No aortic stenosis is present. Pulmonic Valve: The pulmonic valve was normal in structure. Pulmonic valve regurgitation is not visualized. No evidence of pulmonic stenosis. Aorta: The aortic root is normal in size and structure. Venous: The inferior vena cava is normal in size with greater than 50% respiratory variability, suggesting right atrial pressure of 3 mmHg. IAS/Shunts: No atrial level shunt detected by color flow  Doppler.  LEFT VENTRICLE PLAX 2D LVIDd:         4.60 cm  Diastology LVIDs:         3.30 cm  LV e' lateral:   8.70 cm/s LV PW:         1.10 cm  LV E/e' lateral: 9.5 LV IVS:        1.00 cm  LV e' medial:    5.33 cm/s LVOT diam:     1.90 cm  LV E/e' medial:  15.5 LV SV:         56 LV SV Index:   25 LVOT Area:     2.84 cm  RIGHT VENTRICLE RV S prime:     12.10 cm/s TAPSE (M-mode): 2.1 cm LEFT ATRIUM             Index       RIGHT ATRIUM           Index LA diam:        4.20 cm 1.91 cm/m  RA Area:     12.80 cm LA Vol (A2C):   32.7 ml 14.89 ml/m RA Volume:   27.30 ml  12.43 ml/m LA Vol (A4C):   47.4 ml 21.58 ml/m LA Biplane Vol: 42.6 ml 19.39 ml/m  AORTIC VALVE LVOT Vmax:   88.10 cm/s LVOT Vmean:  58.100 cm/s LVOT VTI:    0.196 m  AORTA Ao Root diam: 2.80 cm Ao Asc diam:  3.20 cm MITRAL VALVE               TRICUSPID VALVE MV Area (PHT): 2.91 cm    TR Peak grad:   19.2 mmHg MV Decel Time: 261 msec    TR Vmax:        219.00 cm/s MV E velocity: 82.70 cm/s MV A velocity: 81.00 cm/s  SHUNTS MV E/A ratio:  1.02        Systemic VTI:  0.20 m                            Systemic Diam: 1.90 cm Candee Furbish MD Electronically signed by Candee Furbish MD Signature Date/Time: 07/01/2020/12:21:34 PM    Final    CT HEAD CODE STROKE WO CONTRAST  Result Date: 06/30/2020 CLINICAL DATA:  Code stroke. 59 year old female with weakness and tremor. EXAM: CT HEAD WITHOUT CONTRAST TECHNIQUE: Contiguous axial images were obtained from the base of the skull through the vertex without intravenous contrast. COMPARISON:  Head CT 11/15/2016.  Brain MRI 11/15/2016. FINDINGS: Brain: Cerebral volume remains normal. No midline shift, ventriculomegaly, mass effect, evidence of mass lesion, intracranial hemorrhage or evidence of cortically based acute infarction. Gray-white matter differentiation is within normal limits throughout the brain. No encephalomalacia identified. Vascular: Mild Calcified atherosclerosis at the skull base. No suspicious  intracranial vascular hyperdensity. Skull: Negative. Sinuses/Orbits: Visualized paranasal sinuses and mastoids are stable and well pneumatized. Other: No acute orbit or scalp soft tissue findings. ASPECTS [facility] Stroke Program Early CT Score) Total score (0-10 with 10 being normal): 10 IMPRESSION: 1. Stable and normal noncontrast CT appearance of the brain. ASPECTS 10. 2. These results were communicated to Dr. Cheral Marker at 5:03 pm on 06/30/2020 by text page via the [facility] messaging system. Electronically Signed   By: Genevie Ann M.D.   On: 06/30/2020 17:03   VAS US CAROTID (at [facility] and [facility] only)  Result Date: 07/01/2020 Carotid Arterial Duplex Study Indications:  Speech disturbance and Headache. Risk Factors:      Hypertension, hyperlipidemia. Comparison Study:  Prior normal study from 11/16/16 is available for comparison Performing Technologist: Sharion Dove RVS  Examination Guidelines: A complete evaluation includes B-mode imaging, spectral Doppler, color Doppler, and power Doppler as needed of all accessible portions of each vessel. Bilateral testing is considered an integral part of a complete examination. Limited examinations for reoccurring indications may be performed as noted.  Right Carotid Findings: +----------+--------+--------+--------+------------------+------------------+           PSV cm/sEDV cm/sStenosisPlaque DescriptionComments           +----------+--------+--------+--------+------------------+------------------+ CCA Prox  87      19                                intimal thickening +----------+--------+--------+--------+------------------+------------------+ CCA Distal67      11                                intimal thickening +----------+--------+--------+--------+------------------+------------------+ ICA Prox  71      17                                                   +----------+--------+--------+--------+------------------+------------------+ ICA Distal91       32                                                   +----------+--------+--------+--------+------------------+------------------+ ECA       64      8                                                    +----------+--------+--------+--------+------------------+------------------+ +----------+--------+-------+--------+-------------------+           PSV cm/sEDV cmsDescribeArm Pressure (mmHG) +----------+--------+-------+--------+-------------------+ [id number]                                        +----------+--------+-------+--------+-------------------+ +---------+--------+--+--------+--+ VertebralPSV cm/s36EDV cm/s14 +---------+--------+--+--------+--+  Left Carotid Findings: +----------+--------+--------+--------+------------------+------------------+           PSV cm/sEDV cm/sStenosisPlaque DescriptionComments           +----------+--------+--------+--------+------------------+------------------+ CCA Prox  100     19                                intimal thickening +----------+--------+--------+--------+------------------+------------------+ CCA Distal84      17                                intimal thickening +----------+--------+--------+--------+------------------+------------------+ ICA Prox  76      13                                                   +----------+--------+--------+--------+------------------+------------------+  ICA Distal60      22                                                   +----------+--------+--------+--------+------------------+------------------+ ECA       102     20                                                   +----------+--------+--------+--------+------------------+------------------+ +----------+--------+--------+--------+-------------------+           PSV cm/sEDV cm/sDescribeArm Pressure (mmHG) +----------+--------+--------+--------+-------------------+ [id number]                                          +----------+--------+--------+--------+-------------------+ +---------+--------+--+--------+--+ VertebralPSV cm/s76EDV cm/s23 +---------+--------+--+--------+--+   Summary: Right Carotid: The extracranial vessels were near-normal with only minimal wall                thickening or plaque. Left Carotid: The extracranial vessels were near-normal with only minimal wall               thickening or plaque. Vertebrals:  Bilateral vertebral arteries demonstrate antegrade flow. Subclavians: Normal flow hemodynamics were seen in bilateral subclavian              arteries. *See table(s) above for measurements and observations.     Preliminary     Medications:   .  stroke: mapping our early stages of recovery book   Does not apply Once  . enoxaparin (LOVENOX) injection  55 mg Subcutaneous Q24H  . escitalopram  10 mg Oral QHS  . insulin aspart  0-15 Units Subcutaneous TID WC  . insulin aspart  0-5 Units Subcutaneous QHS  . pantoprazole  40 mg Oral QHS  . [START ON 07/02/2020] rosuvastatin  40 mg Oral Daily  . sodium chloride flush  3 mL Intravenous Once   Continuous Infusions: . methylPREDNISolone (SOLU-MEDROL) injection       LOS: 0 days   Geradine Girt  [facility]   How to contact the [facility] Attending or Consulting provider [place] or covering provider during after hours [place], for this patient?  1. Check the care team in [facility] and look for a) attending/consulting [facility] provider listed and b) the [facility] team listed 2. Log into [email or web address] and use [place]'s universal password to access. If you do not have the password, please contact the hospital operator. 3. Locate the [facility] provider you are looking for under [facility] and page to a number that you can be directly reached. 4. If you still have difficulty reaching the provider, please page the [facility] (Director on Call) for the Hospitalists listed on amion for assistance.  07/01/2020, 5:00 PM

## 2020-07-01 NOTE — Progress Notes (Addendum)
STROKE TEAM PROGRESS NOTE   HISTORY OF PRESENT ILLNESS (per record) Paula [name] is a 59 y.o. African American female with PMH of morbid obesity, prediabetes, anxiety, panic attack, depression sent from doctor's office for code stroke. Patient has stuttering of speech and anxious with speech, therefore history is limited.  However, as per patient, she was in her doctor's office, during exam, her doctors was checking her extraocular movement, it triggered her headache. The headache started at back of the head on the left, the pain also radiating to shoulder and left upper arm.  Then she also felt left hand tingling.  Shortly after she had tremor of her head, slurry speech, stuttering with words. She also complains of nauseous but no vomiting, left arm and leg feel heavy.  EMS called, code stroke activated. Patient sent to ER for evaluation. As per patient, she was on Valium for anxiety and panic attack in the past but currently she is not on any benzos.  She is not on any antithrombotic medication.  Her other home medication including Metformin, Lexapro, Lipitor, Zoloft and hydroxyzine. LSN: 3:30 PM tPA Given: No: None disabling symptoms, likely not stroke   INTERVAL HISTORY Her husband is at the bedside.  She still continues to complain of significant bifrontal headaches especially left frontal temporal region.  She seem to have some soreness on palpation.  She reports that the left side of her head is just not feeling well.  Her symptoms are relatively recent.  She also reports persistent binocular diplopia.  She continues to have pain involving the neck on the left side which radiates into the left shoulder and left upper extremity.  She reports having some left-sided weakness although this is not supported on examination.  She apparently had a spell of diplopia and her developing the symptoms when she was being examined by her physician in the office.  She did have a spell today while being examined  by me.  Movements the left seem to trigger these symptoms are at least makes them worse.  She reports increasing headaches with the spells and worsening left-sided symptoms.  She also developed some shaking of the head and upper extremities bilaterally especially on the right side.    OBJECTIVE Vitals:   07/01/20 0037 07/01/20 0041 07/01/20 0105 07/01/20 0737  BP: 104/72  (!) 121/99 (!) 129/68  Pulse: 57  64 59  Resp: 19  16 18   Temp:  97.8 F (36.6 C) 98 F (36.7 C) 98 F (36.7 C)  TempSrc:  Oral Oral Oral  SpO2: 96%  100% 97%  Weight:      Height:        CBC:  Recent Labs  Lab 06/30/20 1637 06/30/20 1637 06/30/20 1701 07/01/20 0206  WBC 10.1  --   --  9.4  NEUTROABS 4.6  --   --   --   HGB 12.2   < > 13.3 11.7*  HCT 38.8   < > 39.0 36.7  MCV 91.3  --   --  91.1  PLT 340  --   --  332   < > = values in this interval not displayed.    Basic Metabolic Panel:  Recent Labs  Lab 06/30/20 1637 06/30/20 1637 06/30/20 1701 07/01/20 0206  NA 139   < > 142 141  K 3.9   < > 3.9 3.7  CL 103   < > 105 107  CO2 27  --   --  23  GLUCOSE 102*   < > 97 105*  BUN 9   < > 12 10  CREATININE 0.80   < > 0.80 0.80  CALCIUM 9.9  --   --  9.5  MG  --   --   --  2.1   < > = values in this interval not displayed.    Lipid Panel:     Component Value Date/Time   CHOL 281 (H) 07/01/2020 0206   TRIG 175 (H) 07/01/2020 0206   HDL 44 07/01/2020 0206   CHOLHDL 6.4 07/01/2020 0206   VLDL 35 07/01/2020 0206   LDLCALC 202 (H) 07/01/2020 0206   HgbA1c:  Lab Results  Component Value Date   HGBA1C 5.3 07/01/2020   Urine Drug Screen:     Component Value Date/Time   LABOPIA NONE DETECTED 11/15/2016 1342   COCAINSCRNUR NONE DETECTED 11/15/2016 1342   COCAINSCRNUR NEG 01/14/2014 1220   LABBENZ NONE DETECTED 11/15/2016 1342   LABBENZ NEG 01/14/2014 1220   AMPHETMU NONE DETECTED 11/15/2016 1342   THCU NONE DETECTED 11/15/2016 1342   LABBARB NONE DETECTED 11/15/2016 1342     Alcohol Level     Component Value Date/Time   ETH <5 11/15/2016 1255    IMAGING  MR BRAIN WO CONTRAST MR ANGIO HEAD WO CONTRAST 06/30/2020 IMPRESSION:  1. No acute intracranial abnormality. Stable and normal for age noncontrast MRI appearance of the brain.  2. Intracranial MRA is negative when allowing for some motion artifact.   CT HEAD CODE STROKE WO CONTRAST 06/30/2020 IMPRESSION:  Stable and normal noncontrast CT appearance of the brain. ASPECTS 10.    Transthoracic Echocardiogram  00/00/2021 Pending  Bilateral Carotid Dopplers  00/00/2021 Pending  ECG - SR rate 71 BPM per computerized reading. (? Flutter waves vs tremor) (See cardiology reading for complete details)   PHYSICAL EXAM Blood pressure (!) 129/68, pulse 59, temperature 98 F (36.7 C), temperature source Oral, resp. rate 18, height 5\' 5"  (1.651 m), weight (!) 116.1 kg, SpO2 97 %. GENERAL: This is a pleasant morbidly obese female who is in some discomfort but no acute distress.  HEENT: There is mild scalp tenderness on the left side involving the frontal temporal region.  Neck is supple and no trauma noted.  ABDOMEN: soft  EXTREMITIES: No edema   BACK: Normal  SKIN: Normal by inspection.    MENTAL STATUS: Alert and oriented. Speech, language and cognition are generally intact. Judgment and insight normal.   CRANIAL NERVES: Pupils are equal, round and reactive to light and accomodation; extra ocular movements are full, there is no significant nystagmus; visual fields are full; upper and lower facial muscles are normal in strength and symmetric, there is no flattening of the nasolabial folds; tongue is midline; uvula is midline; shoulder elevation is normal.  MOTOR: Normal tone, bulk and strength; no pronator drift.  COORDINATION: Left finger to nose is normal, right finger to nose is normal, No rest tremor; no intention tremor; no postural tremor; no bradykinesia.  REFLEXES: Deep tendon reflexes are  symmetrical and normal.   SENSATION: Normal to light touch, temperature, and pain.       ASSESSMENT/PLAN [name] is a 59 y.o. female with history of morbid obesity, prediabetes, anxiety, panic attack, migraines, OSA, hx of psychogenic head tremor, Jehovah's Witness, known thyroid nodule, and depression presenting with speech difficulties, headache, nausea, head tremor, left sided heaviness and left hand tingling. She did not receive IV t-PA due to absence of disabling symptoms.  Possible TIA vs anxiety vs cervical spine disease  Resultant diplopia, headache and left upper extremity pain.  Code Stroke CT Head - Stable and normal noncontrast CT appearance of the brain. ASPECTS 10.   CT head - not ordered  MRI head - No acute intracranial abnormality. Stable and normal for age noncontrast MRI appearance of the brain.  MRA head - No acute intracranial abnormality. Stable and normal for age noncontrast MRI appearance of the brain.   CTA H&N - not ordered  CT Perfusion - not ordered  Carotid Doppler - pending  2D Echo - pending  Sars Corona Virus 2 - negative  LDL - 202  HgbA1c - 5.3  UDS - not ordered  VTE prophylaxis - Lovenox Diet  Diet Order            Diet heart healthy/carb modified Room service appropriate? Yes; Fluid consistency: Thin  Diet effective now                 No antithrombotic prior to admission, now on No antithrombotic (aspirin recommended by Dr Erlinda Hong however pt "avoids aspirin due to hx of gastric ulcers" - consider enteric coated asa 81mg  daily)  Patient counseled to be compliant with her antithrombotic medications  Ongoing aggressive stroke risk factor management  Therapy recommendations:  pending  Disposition:  Pending  Hypertension  Home BP meds: none   Current BP meds: none   Stable . Permissive hypertension (OK if < 220/120) but gradually normalize in 5-7 days  . Long-term BP goal  normotensive  Hyperlipidemia  Home Lipid lowering medication: Crestor 10 mg daily  LDL 202, goal < 70  Current lipid lowering medication: Crestor 10 mg daily -> increase to 40 mg daily  Continue statin at discharge  Diabetes  Home diabetic meds: metformin  Current diabetic meds: SSI   HgbA1c 5.3, goal < 7.0 Recent Labs    06/30/20 1750 06/30/20 2253 07/01/20 0629  GLUCAP 88 86 110*    Other Stroke Risk Factors  Advanced age  Former cigarette smoker - quit  ETOH use, advised to drink no more than 1 alcoholic beverage per day.  Obesity, Body mass index is 42.59 kg/m., recommend weight loss, diet and exercise as appropriate   Family hx stroke (maternal grandmother)   Migraines  Obstructive sleep apnea, on CPAP at home  Other Active Problems  Code status - Full code  Gastric ulcer hx - pt avoids aspirin  Somewhat abnormal ECG - SR rate 71 BPM per computerized reading. (? Flutter waves vs tremor)  Hospital day # 0  The patient symptoms are ill-defined without any clear unifying diagnosis.  Imaging does not support stroke or other primary central nervous system dysfunction/lesions.  However, she could have significant cervical disc disease causing all of her symptoms which not being eval unusual.  The diplopia is somewhat unusual however.  Cervical spine MRI will be obtained.  We will also check for sed rate and C-reactive protein.  Consider bolus dose of high-dose steroids.  To contact Stroke Continuity provider, please refer to [email or web address] After hours, contact General Neurology

## 2020-07-01 NOTE — Plan of Care (Signed)
  Problem: Education: Goal: Knowledge of General Education information will improve Description: Including pain rating scale, medication(s)/side effects and non-pharmacologic comfort measures Outcome: Progressing   Problem: Clinical Measurements: Goal: Ability to maintain clinical measurements within normal limits will improve Outcome: Progressing Goal: Will remain free from infection Outcome: Progressing Goal: Respiratory complications will improve Outcome: Progressing   Problem: Clinical Measurements: Goal: Will remain free from infection Outcome: Progressing   Problem: Clinical Measurements: Goal: Respiratory complications will improve Outcome: Progressing   Problem: Activity: Goal: Risk for activity intolerance will decrease Outcome: Progressing

## 2020-07-01 NOTE — Progress Notes (Signed)
OT NOTE  PT admitted with TIA symptoms. Pt currently with functional limitiations due to the deficits listed below (see OT problem list). Pt currently with diplopia in L visual field during assessment.Pt reports single vision with occlusion of the opposite eye. L eye reports are blurry with occlusion. R eye reports are "light around object". Pt reports immediate HA on L side of head with visual assessment.  Pt will benefit from skilled OT to increase their independence and safety with adls and balance to allow discharge outpatient OT.   07/01/20 0135  OT Visit Information  Last OT Received On 07/01/20  Assistance Needed +1  History of Present Illness 59 yo female admitted  with  stuttering of speech L hand tingling. pt with workup for TIA currently PMH OSA HTn HLD migraines Fibromyalgia anxiety panic attacks depression diverticulitis IBS psychogenic tremor DM2 obese  Precautions  Precautions Fall  Restrictions  Weight Bearing Restrictions No  Home Living  Family/patient expects to be discharged to: Private residence  Living Arrangements Spouse/significant other  Available Help at Discharge Family;Available PRN/intermittently  Type of Home House  Home Access Stairs to enter  Entrance Stairs-Number of Steps 1  Entrance Stairs-Rails None  Home Layout One level  Medical laboratory scientific officer seat;Hand held shower head  Additional Comments has fish at home to care for  Prior Function  Level of [place] at [employer] Expressive difficulties  Pain Assessment  Pain Assessment Faces  Faces Pain Scale 4  Pain Location head  Pain Descriptors / Indicators Headache  Pain Intervention(s) Monitored during session;Repositioned  Cognition  Arousal/Alertness Awake/alert  Behavior During Therapy WFL for tasks assessed/performed  Overall Cognitive Status Within Functional Limits  for tasks assessed  Upper Extremity Assessment  Upper Extremity Assessment RUE deficits/detail;LUE deficits/detail  RUE Deficits / Details AROM WFL noted to have decreased finger to nose  LUE Deficits / Details AROM 80 degrees, grimace and reports baseline L shoulder deficits for flexion, pt with tingling. pt unable to sustain shoulder flexion has downward drift  Lower Extremity Assessment  Lower Extremity Assessment Defer to PT evaluation  Cervical / Trunk Assessment  Cervical / Trunk Assessment Normal  ADL  Overall ADL's  Needs assistance/impaired  Eating/Feeding Supervision/ safety;Bed level  Grooming Wash/dry face;Wash/dry hands;Oral care;Supervision/safety;Standing  Personnel officer- Technical sales engineer;Sit to/from stand  Functional mobility during ADLs Min guard;Rolling walker  Vision- History  Baseline Vision/History Wears glasses  Wears Glasses Reading only  Vision- Assessment  Vision Assessment? Yes  Eye Alignment Impaired (comment)  Ocular Range of Motion Impaired-to be further tested in functional context  Alignment/Gaze Preference WDL  Tracking/Visual Pursuits Decreased smoothness of horizontal tracking;Impaired - to be further tested in functional context  Convergence Impaired (comment)  Diplopia Assessment Only with left gaze;Disappears with one eye closed  Depth Perception Undershoots  Additional Comments pt reports diplopia. pt reports blurry L eye vision with occlusion of R. Pt reports "light around letters" with R eye with L eye occlusion. Pt states "its like there is a highlight around the letter edges" Pt with normal vision in R visual fields. pt with horizontal movement reports diplopia just at left bridge of nose with testing. pt noted to have decreased lateral movement of L eye causing dysconjugate gaze  Bed Mobility  Overal bed mobility Needs Assistance  Bed Mobility Supine to Sit;Rolling  Rolling  Supervision  Supine to  sit Supervision  General bed mobility comments pt with heavy use of bed rail with R UE on side and then reaching due to L UE deficits  Transfers  Overall transfer level Needs assistance  Equipment used Rolling walker (2 wheeled)  Transfers Sit to/from Stand  Sit to Stand Min assist  General transfer comment mod cues for safety with RW  Balance  Overall balance assessment Mild deficits observed, not formally tested  OT - End of Session  Equipment Utilized During Treatment Rolling walker  Activity Tolerance Patient tolerated treatment well  Patient left in bed;Other (comment) (transport arrival for ECHO)  Nurse Communication Mobility status;Precautions  OT Assessment  OT Recommendation/Assessment Patient needs continued OT Services  OT Visit Diagnosis Unsteadiness on feet (R26.81);Muscle weakness (generalized) (M62.81)  OT Problem List Decreased strength;Decreased range of motion;Decreased activity tolerance;Impaired balance (sitting and/or standing);Impaired vision/perception;Decreased coordination;Decreased knowledge of use of DME or AE;Decreased knowledge of precautions;Impaired sensation;Obesity;Impaired UE functional use  OT Plan  OT Frequency (ACUTE ONLY) Min 2X/week  OT Treatment/Interventions (ACUTE ONLY) Self-care/ADL training;Therapeutic exercise;Neuromuscular education;Energy conservation;DME and/or AE instruction;Manual therapy;Modalities;Therapeutic activities;Visual/perceptual remediation/compensation;Patient/family education;Balance training  AM-PAC OT "6 Clicks" Daily Activity Outcome Measure (Version 2)  Help from another person eating meals? 3  Help from another person taking care of personal grooming? 3  Help from another person toileting, which includes using toliet, bedpan, or urinal? 3  Help from another person bathing (including washing, rinsing, drying)? 3  Help from another person to put on and taking off regular upper body clothing? 3  Help  from another person to put on and taking off regular lower body clothing? 3  6 Click Score 18  OT Recommendation  Follow Up Recommendations Outpatient OT  OT Equipment None recommended by OT  Individuals Consulted  Consulted and Agree with Results and Recommendations Patient;Family member/caregiver  Family Member Consulted spouse  Acute Rehab OT Goals  Patient Stated Goal to get better  OT Goal Formulation With patient/family  Time For Goal Achievement 07/15/20  Potential to Achieve Goals Good  OT Time Calculation  OT Start Time (ACUTE ONLY) 0915  OT Stop Time (ACUTE ONLY) 0944  OT Time Calculation (min) 29 min  OT General Charges  $OT Visit 1 Visit  OT Evaluation  $OT Eval Moderate Complexity 1 Mod  OT Treatments  $Self Care/Home Management  8-22 mins  Written Expression  Dominant Hand Right    Brynn, OTR/L  Acute Rehabilitation Services Pager: [phone] Office: [phone] .

## 2020-07-01 NOTE — Progress Notes (Signed)
PT Cancellation Note  Patient Details Name: Paula [name] MRN: [id number] DOB: January 20, 1961   Cancelled Treatment:    Reason Eval/Treat Not Completed: Patient at procedure or test/unavailable (MRI) PT will continue to f/u with pt acutely as available.    [place] 07/01/2020, 3:42 PM

## 2020-07-01 NOTE — Progress Notes (Signed)
  Echocardiogram 2D Echocardiogram has been performed.  Johny Chess 07/01/2020, 11:03 AM

## 2020-07-02 DIAGNOSIS — F985 Adult onset fluency disorder: Secondary | ICD-10-CM | POA: Diagnosis not present

## 2020-07-02 LAB — GLUCOSE, CAPILLARY
Glucose-Capillary: 150 mg/dL — ABNORMAL HIGH (ref 70–99)
Glucose-Capillary: 149 mg/dL — ABNORMAL HIGH (ref 70–99)

## 2020-07-02 MED ORDER — CYCLOBENZAPRINE HCL 5 MG PO TABS: 5.0000 mg | ORAL_TABLET | Freq: Two times a day (BID) | ORAL | 0 refills | Status: DC | PRN

## 2020-07-02 NOTE — Progress Notes (Signed)
Discharge instructions given. Patient verbalized understanding and all questions were answered.  ?

## 2020-07-02 NOTE — Care Management (Signed)
Outpatient OT referral sent.

## 2020-07-02 NOTE — Progress Notes (Signed)
STROKE TEAM PROGRESS NOTE   HISTORY OF PRESENT ILLNESS (per record) Paula [name] is a 59 y.o. African American female with PMH of morbid obesity, prediabetes, anxiety, panic attack, depression sent from doctor's office for code stroke. Patient has stuttering of speech and anxious with speech, therefore history is limited.  However, as per patient, she was in her doctor's office, during exam, her doctors was checking her extraocular movement, it triggered her headache. The headache started at back of the head on the left, the pain also radiating to shoulder and left upper arm.  Then she also felt left hand tingling.  Shortly after she had tremor of her head, slurry speech, stuttering with words. She also complains of nauseous but no vomiting, left arm and leg feel heavy.  EMS called, code stroke activated. Patient sent to ER for evaluation. As per patient, she was on Valium for anxiety and panic attack in the past but currently she is not on any benzos.  She is not on any antithrombotic medication.  Her other home medication including Metformin, Lexapro, Lipitor, Zoloft and hydroxyzine. LSN: 3:30 PM tPA Given: No: None disabling symptoms, likely not stroke   INTERVAL HISTORY She seems improved with the steroids but still has bilateral frontal headaches especially on the left side.  There is also associated headache involving the occipital duration, neck on the left side, left shoulder and left upper extremity.  Head shaking and shaking of the arms have resolved.  She still has diplopia when looking to the left.   OBJECTIVE Vitals:   07/01/20 1216 07/01/20 1821 07/02/20 0003 07/02/20 0511  BP: (!) 133/73 117/76 (!) 148/72 (!) 143/85  Pulse: 66 73 68 67  Resp: 16 16 18 18   Temp: 98 F (36.7 C) 98.1 F (36.7 C) 98.4 F (36.9 C) 98 F (36.7 C)  TempSrc: Oral Oral Oral Oral  SpO2: 99% 99% 95% 97%  Weight:      Height:        CBC:  Recent Labs  Lab 06/30/20 1637 06/30/20 1637  06/30/20 1701 07/01/20 0206  WBC 10.1  --   --  9.4  NEUTROABS 4.6  --   --   --   HGB 12.2   < > 13.3 11.7*  HCT 38.8   < > 39.0 36.7  MCV 91.3  --   --  91.1  PLT 340  --   --  332   < > = values in this interval not displayed.    Basic Metabolic Panel:  Recent Labs  Lab 06/30/20 1637 06/30/20 1637 06/30/20 1701 07/01/20 0206  NA 139   < > 142 141  K 3.9   < > 3.9 3.7  CL 103   < > 105 107  CO2 27  --   --  23  GLUCOSE 102*   < > 97 105*  BUN 9   < > 12 10  CREATININE 0.80   < > 0.80 0.80  CALCIUM 9.9  --   --  9.5  MG  --   --   --  2.1   < > = values in this interval not displayed.    Lipid Panel:     Component Value Date/Time   CHOL 281 (H) 07/01/2020 0206   TRIG 175 (H) 07/01/2020 0206   HDL 44 07/01/2020 0206   CHOLHDL 6.4 07/01/2020 0206   VLDL 35 07/01/2020 0206   LDLCALC 202 (H) 07/01/2020 0206   HgbA1c:  Lab  Results  Component Value Date   HGBA1C 5.3 07/01/2020   Urine Drug Screen:     Component Value Date/Time   LABOPIA NONE DETECTED 11/15/2016 1342   COCAINSCRNUR NONE DETECTED 11/15/2016 1342   COCAINSCRNUR NEG 01/14/2014 1220   LABBENZ NONE DETECTED 11/15/2016 1342   LABBENZ NEG 01/14/2014 1220   AMPHETMU NONE DETECTED 11/15/2016 1342   THCU NONE DETECTED 11/15/2016 1342   LABBARB NONE DETECTED 11/15/2016 1342    Alcohol Level     Component Value Date/Time   ETH <5 11/15/2016 1255    IMAGING  MR BRAIN WO CONTRAST MR ANGIO HEAD WO CONTRAST 06/30/2020 IMPRESSION:  1. No acute intracranial abnormality. Stable and normal for age noncontrast MRI appearance of the brain.  2. Intracranial MRA is negative when allowing for some motion artifact.   CT HEAD CODE STROKE WO CONTRAST 06/30/2020 IMPRESSION:  Stable and normal noncontrast CT appearance of the brain. ASPECTS 10.   MRI C Spine WO Contrast 07/01/20 IMPRESSION: 1. Mild progression of degenerative changes at the C3-4 level with moderate canal stenosis and mild right foraminal  stenosis. 2. Prior decompression and fusion at C4-C7 with no residual foraminal or canal stenosis at the fused levels.  Transthoracic Echocardiogram  07/01/20 IMPRESSIONS  1. Left ventricular ejection fraction, by estimation, is 55 to 60%. The  left ventricle has normal function. The left ventricle has no regional  wall motion abnormalities. Left ventricular diastolic parameters are  consistent with Grade I diastolic  dysfunction (impaired relaxation).  2. Right ventricular systolic function is normal. The right ventricular  size is normal. There is normal pulmonary artery systolic pressure. The  estimated right ventricular systolic pressure is 63.8 mmHg.  3. The mitral valve is normal in structure. No evidence of mitral valve  regurgitation. No evidence of mitral stenosis.  4. The aortic valve is normal in structure. Aortic valve regurgitation is  not visualized. No aortic stenosis is present.  5. The inferior vena cava is normal in size with greater than 50%  respiratory variability, suggesting right atrial pressure of 3 mmHg.   Bilateral Carotid Dopplers  07/01/20 Summary:  Right Carotid: The extracranial vessels were near-normal with only minimal wall thickening or plaque.  Left Carotid: The extracranial vessels were near-normal with only minimal wallthickening or plaque.  Vertebrals: Bilateral vertebral arteries demonstrate antegrade flow.  Subclavians: Normal flow hemodynamics were seen in bilateral subclavian arteries.   ECG - SR rate 71 BPM per computerized reading. (? Flutter waves vs tremor) (See cardiology reading for complete details) Repeat ECG - NSR rate 60 BPM   PHYSICAL EXAM Blood pressure (!) 143/85, pulse 67, temperature 98 F (36.7 C), temperature source Oral, resp. rate 18, height 5\' 5"  (1.651 m), weight (!) 116.1 kg, SpO2 97 %. GENERAL: This is a pleasant morbidly obese female who is in some discomfort but no acute distress.  HEENT: There is mild scalp  tenderness on the left side involving the frontal temporal region.  Neck is supple and no trauma noted.  EXTREMITIES: No edema   BACK: Normal  SKIN: Normal by inspection.    MENTAL STATUS: Alert and oriented. Speech, language and cognition are generally intact. Judgment and insight normal.   CRANIAL NERVES: Pupils are equal, round and reactive to light and accomodation; extra ocular movements are full, there is no significant nystagmus; visual fields are full; upper and lower facial muscles are normal in strength and symmetric, there is no flattening of the nasolabial folds; tongue is midline; uvula is  midline; shoulder elevation is normal.  MOTOR: Normal tone, bulk and strength; no pronator drift.  COORDINATION: Left finger to nose is normal, right finger to nose is normal, No rest tremor; no intention tremor; no postural tremor; no bradykinesia.  SENSATION: Normal to light touch, temperature, and pain.       ASSESSMENT/PLAN Ms. CHERESE LOZANO is a 59 y.o. female with history of morbid obesity, prediabetes, anxiety, panic attack, migraines, OSA, hx of psychogenic head tremor, Jehovah's Witness, known thyroid nodule, and depression presenting with speech difficulties, headache, nausea, head tremor, left sided heaviness and left hand tingling. She did not receive IV t-PA due to absence of disabling symptoms.  Possible TIA vs anxiety vs cervical spine disease  Resultant diplopia, headache and left upper extremity pain.  Code Stroke CT Head - Stable and normal noncontrast CT appearance of the brain. ASPECTS 10.   CT head - not ordered  MRI head - No acute intracranial abnormality. Stable and normal for age noncontrast MRI appearance of the brain.  MRA head - No acute intracranial abnormality. Stable and normal for age noncontrast MRI appearance of the brain.   MRI C Spine - Mild progression of degenerative changes at the C3-4 level with moderate canal stenosis and mild right  foraminal stenosis. Prior decompression and fusion at C4-C7 with no residual foraminal or canal stenosis at the fused levels.  CTA H&N - not ordered  CT Perfusion - not ordered  Carotid Doppler - unremarkable  2D Echo - EF 55 - 60%. No cardiac source of emboli identified.   Sars Corona Virus 2 - negative   Sed Rate - 15 (normal)   CRP - 0.7 (normal)   LDL - 202  HgbA1c - 5.3  UDS - not ordered  Solumedrol 500 mg IV daily for 2 days  VTE prophylaxis - Lovenox Diet  Diet Order            Diet heart healthy/carb modified Room service appropriate? Yes; Fluid consistency: Thin  Diet effective now                 No antithrombotic prior to admission, now on No antithrombotic (aspirin recommended by Dr Erlinda Hong however pt "avoids aspirin due to hx of gastric ulcers") Aspirin was not started.  Patient counseled to be compliant with her antithrombotic medications  Ongoing aggressive stroke risk factor management  Therapy recommendations:  No f/u therapy recommended  Disposition:  Pending  Hypertension  Home BP meds: none   Current BP meds: none   Stable . Permissive hypertension (OK if < 220/120) but gradually normalize in 5-7 days  . Long-term BP goal normotensive  Hyperlipidemia  Home Lipid lowering medication: Crestor 10 mg daily  LDL 202, goal < 70  Current lipid lowering medication: Crestor 10 mg daily -> increased to 40 mg daily  Continue statin at discharge  Diabetes  Home diabetic meds: metformin  Current diabetic meds: SSI   HgbA1c 5.3, goal < 7.0 Recent Labs    07/01/20 1604 07/01/20 2126 07/02/20 0604  GLUCAP 115* 106* 150*    Other Stroke Risk Factors  Advanced age  Former cigarette smoker - quit  ETOH use, advised to drink no more than 1 alcoholic beverage per day.  Obesity, Body mass index is 42.59 kg/m., recommend weight loss, diet and exercise as appropriate   Family hx stroke (maternal grandmother)    Migraines  Obstructive sleep apnea, on CPAP at home  Other Active Problems  Code status -  Full code  Gastric ulcer hx - pt avoids aspirin  Somewhat abnormal ECG - SR rate 71 BPM per computerized reading. (? 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  I think this is the most likely etiology for her symptoms.  She will receive another dose of steroids.  Consider further treatment with epidural injections and/or surgery depending on how she responds.  Muscle relaxers also recommended.  Unlikely to be ischemic event and therefore antiplatelet agents not indicated.  To contact Stroke Continuity provider, please refer to [email or web address] After hours, contact General Neurology

## 2020-07-02 NOTE — Progress Notes (Signed)
Occupational Therapy Treatment Patient Details Name: Paula [name] MRN: [id number] DOB: 20-Jan-1961 Today's Date: 07/02/2020    History of present illness 59 yo female admitted  with  stuttering of speech L hand tingling. pt with workup for TIA currently PMH OSA HTn HLD migraines Fibromyalgia anxiety panic attacks depression diverticulitis IBS psychogenic tremor DM2 obese   OT comments  Pt reports diplopia continued at this time. Pt provided occluded taping for L eye this session. Pt educated on exercises and taping reduction with tape provided for home. Pt thanking OT and verbalized information back as teach back.   Follow Up Recommendations  Outpatient OT    Equipment Recommendations  None recommended by OT    Recommendations for Other Services      Precautions / Restrictions Precautions Precautions: Fall Restrictions Weight Bearing Restrictions: No       Mobility Bed Mobility Overal bed mobility: Needs Assistance Bed Mobility: Supine to Sit Rolling: Supervision            Transfers                      Balance                                           ADL either performed or assessed with clinical judgement   ADL Overall ADL's : Needs assistance/impaired                                       General ADL Comments: pt provided tape on her glasses from purse. task of reading and locating video/photos on phone used to help with taping occlusion task. pt reports diplopia one on top of another .      Vision   Diplopia Assessment: Objects split on top of one another;Only with left gaze Additional Comments: handout provided for diplopia and reviewed verbally due to HA with testing for taping of glasses. tape provided and instructred on how to apply tape at home and start to reduce the tape. Pt reports single vision at end of session   Perception     Praxis      Cognition Arousal/Alertness: Awake/alert Behavior  During Therapy: WFL for tasks assessed/performed Overall Cognitive Status: Within Functional Limits for tasks assessed                                          Exercises     Shoulder Instructions       General Comments pt expressed additional stressors and does not understand how her neck could be causing all this issue. pt reports i get why my arm would hurt but id ont understad my vision and head pain    Pertinent Vitals/ Pain       Pain Assessment: 0-10 Pain Score: 5  Pain Location: head Pain Descriptors / Indicators: Headache Pain Intervention(s): Monitored during session;Premedicated before session;Repositioned  Home Living                                          Prior Functioning/Environment  Frequency  Min 2X/week        Progress Toward Goals  OT Goals(current goals can now be found in the care plan section)  Progress towards OT goals: Progressing toward goals  Acute Rehab OT Goals Patient Stated Goal: to get better OT Goal Formulation: With patient/family Time For Goal Achievement: 07/15/20 Potential to Achieve Goals: Good ADL Goals Pt Will Perform Grooming: with modified independence Pt Will Perform Upper Body Dressing: with modified independence Pt Will Perform Lower Body Dressing: with modified independence;sit to/from stand Pt Will Transfer to Toilet: with modified independence;ambulating Pt/caregiver will Perform Home Exercise Program: Increased strength;Increased ROM;Left upper extremity;With Supervision;With written HEP provided;With theraband Additional ADL Goal #1: pt will demonstrate exercises and verbalize diplopia recommendation for glass occlusion MOD I  Plan Discharge plan remains appropriate    Co-evaluation                 AM-PAC OT "6 Clicks" Daily Activity     Outcome Measure   Help from another person eating meals?: A Little Help from another person taking care of personal  grooming?: A Little Help from another person toileting, which includes using toliet, bedpan, or urinal?: A Little Help from another person bathing (including washing, rinsing, drying)?: A Little Help from another person to put on and taking off regular upper body clothing?: A Little Help from another person to put on and taking off regular lower body clothing?: A Little 6 Click Score: 18    End of Session    OT Visit Diagnosis: Unsteadiness on feet (R26.81);Muscle weakness (generalized) (M62.81)   Activity Tolerance Patient tolerated treatment well   Patient Left in bed;Other (comment)   Nurse Communication Mobility status;Precautions        Time: 4270-6237 OT Time Calculation (min): 34 min  Charges: OT General Charges $OT Visit: 1 Visit OT Treatments $Self Care/Home Management : 23-37 mins   Paula [name], OTR/L  Acute Rehabilitation Services Pager: [phone] Office: [phone] .    Jeri Modena 07/02/2020, 11:14 AM

## 2020-07-02 NOTE — Discharge Summary (Signed)
Physician Discharge Summary  Paula [name] BMS:[id number] DOB: 1961-03-30 DOA: 06/30/2020  PCP: Carol Ada, MD  Admit date: 06/30/2020 Discharge date: 07/02/2020  Admitted From: home Discharge disposition: home   Recommendations for Outpatient Follow-Up:   1. Outpatient follow up with Dr. Cyndy Freeze 2. Outpatient OT 3. Monitor BP   Discharge Diagnosis:   Principal Problem:   Adult onset stuttering Active Problems:   Dyslipidemia   Anxiety state   Essential hypertension   OSA on CPAP   Coarse tremors   Depression, major   Chronic pain syndrome   Migraine   Fibromyalgia   Type 2 diabetes mellitus without complication (HCC)   TIA (transient ischemic attack)    Discharge Condition: Improved.  Diet recommendation: Low sodium, heart healthy.  Carbohydrate-modified.  Wound care: None.  Code status: Full.   History of Present Illness:   59 year old female who states that she has had a temporal headache for the past couple of weeks.  It eventually moved to the back of her head on the left and stayed.  She had pain started in the back of her shoulder and her arm.  She started feeling really badly and she went and saw her MD today.  While her MD was with eye exam, she developed watery eyes, then double vision.  She started stretching, her fingers were tingling and numb and her face appeared to become frozen around her small.  She had palpitations, nausea and was very anxious.  She also reported that she had left arm pains.  Her PCP called 911 and sent her into the ER as a code stroke.  She also reports she had some fleeting chest pains that have now resolved.  Patient states she has had speech impediment/stuttering before but she has never had it associated with this facial asymmetry.  Thought process currently in the ER during the interview   Hospital Course by Problem:   Adult onset stuttering/Anxiety state/migraine/neck pain -CT head, MRI and MRA head and neck  normal -echo/carotids unremarkable -PT/OT no follow up with PT nut outpatient OT recommendations -MRI neck with: Mild (neuro thinks more severe) progression of degenerative changes at the C3-4 level with moderate canal stenosis and mild right foraminal stenosis-- discussed with neurology, steroids IV x 2 doses with improvement -CRP/ESR negative -patient states she is under a lot of stress-- husband has PTSD, her mother lives with her and 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    Medical Consultants:   Neurology    Discharge Exam:   Vitals:   07/02/20 0511 07/02/20 1108  BP: (!) 143/85 (!) 157/83  Pulse: 67 74  Resp: 18 18  Temp: 98 F (36.7 C) 98 F (36.7 C)  SpO2: 97% 98%   Vitals:   07/01/20 1821 07/02/20 0003 07/02/20 0511 07/02/20 1108  BP: 117/76 (!) 148/72 (!) 143/85 (!) 157/83  Pulse: 73 68 67 74  Resp: _0 Temp: 98.1 F (36.7 C) 98.4 F (36.9 C) 98 F (36.7 C) 98 F (36.7 C)  TempSrc: Oral Oral Oral Oral  SpO2: 99% 95% 97% 98%  Weight:      Height:        General exam: Appears calm and comfortable.  The results of significant diagnostics from this hospitalization (including imaging, microbiology, ancillary and laboratory) are listed below for reference.     Procedures and Diagnostic Studies:   MR ANGIO HEAD WO CONTRAST  Result Date: 06/30/2020 CLINICAL DATA:  59 year old female code stroke presentation with weakness and tremor. EXAM: MRI HEAD WITHOUT CONTRAST MRA HEAD WITHOUT CONTRAST TECHNIQUE: Multiplanar, multiecho pulse sequences of the brain and surrounding structures were obtained without intravenous contrast. Angiographic images of the head were obtained using MRA technique without contrast. COMPARISON:   Head CT earlier today.  Brain MRI 11/15/2016 FINDINGS: MRI HEAD FINDINGS Brain: No restricted diffusion to suggest acute infarction. No midline shift, mass effect, evidence of mass lesion, ventriculomegaly, extra-axial collection or acute intracranial hemorrhage. Cervicomedullary junction and pituitary are within normal limits. Cerebral volume remains normal for age. [place] Cables and white matter signal remains within normal limits for age. No convincing encephalomalacia. No chronic cerebral blood products. Vascular: Major intracranial vascular flow voids are stable. Skull and upper cervical spine: Visible bone marrow signal is stable and within normal limits. Partially visible hardware artifact from cervical ACDF. Sinuses/Orbits: Negative. Other: Mastoids remain clear. Grossly negative internal auditory structures. MRA HEAD FINDINGS Mild motion artifact. Antegrade flow in the posterior circulation with fairly codominant distal vertebral arteries. No distal vertebral artery or basilar artery stenosis. Motion artifact at the basilar tip. Patent SCA and PCA origins. Left posterior communicating artery is present. Bilateral PCA branches are within normal limits. Antegrade flow in both ICA siphons. Tortuous cavernous ICAs. No siphon stenosis identified. Normal left posterior communicating artery origin. Ophthalmic artery origins appear within normal limits. Patent carotid termini. Normal MCA and ACA origins. Diminutive or absent anterior communicating artery. MCA M1 segments and bifurcations are patent without stenosis. Some MCA and ACA branch detail is obscured. No branch occlusion identified. IMPRESSION: 1. No acute intracranial abnormality. Stable and normal for age noncontrast MRI appearance of the brain. 2. Intracranial MRA is negative when allowing for some motion artifact. Electronically Signed   By: Genevie Ann M.D.   On: 06/30/2020 19:10   MR BRAIN WO CONTRAST  Result Date: 06/30/2020 CLINICAL DATA:  59 year old female  code stroke presentation with weakness and tremor. EXAM: MRI HEAD WITHOUT CONTRAST MRA HEAD WITHOUT CONTRAST TECHNIQUE: Multiplanar, multiecho pulse sequences of the brain and surrounding structures were obtained without intravenous contrast. Angiographic images of the head were obtained using MRA technique without contrast. COMPARISON:  Head CT earlier today.  Brain MRI 11/15/2016 FINDINGS: MRI HEAD FINDINGS Brain: No restricted diffusion to suggest acute infarction. No midline shift, mass effect, evidence of mass lesion, ventriculomegaly, extra-axial collection or acute intracranial hemorrhage. Cervicomedullary junction and pituitary are within normal limits. Cerebral volume remains normal for age. Pear[place] Cables and white matter signal remains within normal limits for age. No convincing encephalomalacia. No chronic cerebral blood products. Vascular: Major intracranial vascular flow voids are stable. Skull and upper cervical spine: Visible bone marrow signal is stable and within normal limits. Partially visible hardware artifact from cervical ACDF. Sinuses/Orbits: Negative. Other: Mastoids remain clear. Grossly negative internal auditory structures. MRA HEAD FINDINGS Mild motion artifact. Antegrade flow in the posterior circulation with fairly codominant distal vertebral arteries. No distal vertebral artery or basilar artery stenosis. Motion artifact at the basilar tip. Patent SCA and PCA origins. Left posterior communicating artery is present. Bilateral PCA branches are within normal limits. Antegrade flow in both ICA siphons. Tortuous cavernous ICAs. No siphon stenosis identified. Normal left posterior communicating artery origin. Ophthalmic artery origins appear within normal  limits. Patent carotid termini. Normal MCA and ACA origins. Diminutive or absent anterior communicating artery. MCA M1 segments and bifurcations are patent without stenosis. Some MCA and ACA branch detail is obscured. No branch occlusion identified.  IMPRESSION: 1. No acute intracranial abnormality. Stable and normal for age noncontrast MRI appearance of the brain. 2. Intracranial MRA is negative when allowing for some motion artifact. Electronically Signed   By: Genevie Ann M.D.   On: 06/30/2020 19:10   MR CERVICAL SPINE WO CONTRAST  Result Date: 07/01/2020 CLINICAL DATA:  Upper extremity numbness and tingling. EXAM: MRI CERVICAL SPINE WITHOUT CONTRAST TECHNIQUE: Multiplanar, multisequence MR imaging of the cervical spine was performed. No intravenous contrast was administered. COMPARISON:  CT 05/02/2018.  MRI 02/02/2016 FINDINGS: Alignment: 2 mm anterolisthesis C7 on T1. Straightening of the cervical lordosis. Vertebrae: Postsurgical changes from prior decompression and anterior fusion with corpectomy and strut graft at the C4-C7 levels with evidence of solid fusion. No evidence of fracture, discitis, or suspicious bone lesion. Low signal ossification of the posterior longitudinal ligament at the C2 and C2-3 level, better seen on prior CT. Cord: Normal signal and morphology. Posterior Fossa, vertebral arteries, paraspinal tissues: Negative. Disc levels: C2-C3: Small posterior disc osteophyte complex. Minimal bilateral facet and uncovertebral arthropathy. No foraminal or canal stenosis. Unchanged. C3-C4: Right paracentral disc osteophyte complex contacts and slightly deforms the ventral cord resulting in moderate canal stenosis. No associated cord signal abnormality. Minimal facet and uncovertebral arthropathy with mild right foraminal stenosis. Findings have mildly progressed from prior MRI. C4-C5 through C6-C7: Prior decompression and fusion. Canal and bilateral foramina are widely patent at all levels. Unchanged. C7-T1: Small disc osteophyte complex with mild bilateral facet arthropathy. No foraminal or canal stenosis. Unchanged. IMPRESSION: 1. Mild progression of degenerative changes at the C3-4 level with moderate canal stenosis and mild right foraminal  stenosis. 2. Prior decompression and fusion at C4-C7 with no residual foraminal or canal stenosis at the fused levels. Electronically Signed   By: Davina Poke D.O.   On: 07/01/2020 16:17   ECHOCARDIOGRAM COMPLETE  Result Date: 07/01/2020    ECHOCARDIOGRAM REPORT   Patient Name:   KAREMA TOCCI Date of Exam: 07/01/2020 Medical Rec #:  [id number]       Height:       65.0 in Accession #:    [id number]      Weight:       256.0 lb Date of Birth:  [birth year]       BSA:          2.197 m Patient Age:    75 years        BP:           129/68 mmHg Patient Gender: F               HR:           63 bpm. Exam Location:  Inpatient Procedure: 2D Echo Indications:    TIA 435.9  History:        Patient has prior history of Echocardiogram examinations, most                 recent 07/16/2018. Risk Factors:Sleep Apnea, Hypertension,                 Dyslipidemia and Diabetes.  Sonographer:    Johny Chess Referring Phys: [place]  1. Left ventricular ejection fraction, by estimation, is 55 to 60%. The left ventricle has normal function. The left  ventricle has no regional wall motion abnormalities. Left ventricular diastolic parameters are consistent with Grade I diastolic dysfunction (impaired relaxation).  2. Right ventricular systolic function is normal. The right ventricular size is normal. There is normal pulmonary artery systolic pressure. The estimated right ventricular systolic pressure is 32.4 mmHg.  3. The mitral valve is normal in structure. No evidence of mitral valve regurgitation. No evidence of mitral stenosis.  4. The aortic valve is normal in structure. Aortic valve regurgitation is not visualized. No aortic stenosis is present.  5. The inferior vena cava is normal in size with greater than 50% respiratory variability, suggesting right atrial pressure of 3 mmHg. Conclusion(s)/Recommendation(s): No intracardiac source of embolism detected on this transthoracic study. A transesophageal  echocardiogram is recommended to exclude cardiac source of embolism if clinically indicated. FINDINGS  Left Ventricle: Left ventricular ejection fraction, by estimation, is 55 to 60%. The left ventricle has normal function. The left ventricle has no regional wall motion abnormalities. The left ventricular internal cavity size was normal in size. There is  no left ventricular hypertrophy. Left ventricular diastolic parameters are consistent with Grade I diastolic dysfunction (impaired relaxation). Right Ventricle: The right ventricular size is normal. No increase in right ventricular wall thickness. Right ventricular systolic function is normal. There is normal pulmonary artery systolic pressure. The tricuspid regurgitant velocity is 2.19 m/s, and  with an assumed right atrial pressure of 3 mmHg, the estimated right ventricular systolic pressure is 40.1 mmHg. Left Atrium: Left atrial size was normal in size. Right Atrium: Right atrial size was normal in size. Pericardium: There is no evidence of pericardial effusion. Mitral Valve: The mitral valve is normal in structure. Normal mobility of the mitral valve leaflets. No evidence of mitral valve regurgitation. No evidence of mitral valve stenosis. Tricuspid Valve: The tricuspid valve is normal in structure. Tricuspid valve regurgitation is trivial. No evidence of tricuspid stenosis. Aortic Valve: The aortic valve is normal in structure. Aortic valve regurgitation is not visualized. No aortic stenosis is present. Pulmonic Valve: The pulmonic valve was normal in structure. Pulmonic valve regurgitation is not visualized. No evidence of pulmonic stenosis. Aorta: The aortic root is normal in size and structure. Venous: The inferior vena cava is normal in size with greater than 50% respiratory variability, suggesting right atrial pressure of 3 mmHg. IAS/Shunts: No atrial level shunt detected by color flow Doppler.  LEFT VENTRICLE PLAX 2D LVIDd:         4.60 cm  Diastology  LVIDs:         3.30 cm  LV e' lateral:   8.70 cm/s LV PW:         1.10 cm  LV E/e' lateral: 9.5 LV IVS:        1.00 cm  LV e' medial:    5.33 cm/s LVOT diam:     1.90 cm  LV E/e' medial:  15.5 LV SV:         56 LV SV Index:   25 LVOT Area:     2.84 cm  RIGHT VENTRICLE RV S prime:     12.10 cm/s TAPSE (M-mode): 2.1 cm LEFT ATRIUM             Index       RIGHT ATRIUM           Index LA diam:        4.20 cm 1.91 cm/m  RA Area:     12.80 cm LA Vol (A2C):   32.7  ml 14.89 ml/m RA Volume:   27.30 ml  12.43 ml/m LA Vol (A4C):   47.4 ml 21.58 ml/m LA Biplane Vol: 42.6 ml 19.39 ml/m  AORTIC VALVE LVOT Vmax:   88.10 cm/s LVOT Vmean:  58.100 cm/s LVOT VTI:    0.196 m  AORTA Ao Root diam: 2.80 cm Ao Asc diam:  3.20 cm MITRAL VALVE               TRICUSPID VALVE MV Area (PHT): 2.91 cm    TR Peak grad:   19.2 mmHg MV Decel Time: 261 msec    TR Vmax:        219.00 cm/s MV E velocity: 82.70 cm/s MV A velocity: 81.00 cm/s  SHUNTS MV E/A ratio:  1.02        Systemic VTI:  0.20 m                            Systemic Diam: 1.90 cm Candee Furbish MD Electronically signed by Candee Furbish MD Signature Date/Time: 07/01/2020/12:21:34 PM    Final    CT HEAD CODE STROKE WO CONTRAST  Result Date: 06/30/2020 CLINICAL DATA:  Code stroke. 59 year old female with weakness and tremor. EXAM: CT HEAD WITHOUT CONTRAST TECHNIQUE: Contiguous axial images were obtained from the base of the skull through the vertex without intravenous contrast. COMPARISON:  Head CT 11/15/2016.  Brain MRI 11/15/2016. FINDINGS: Brain: Cerebral volume remains normal. No midline shift, ventriculomegaly, mass effect, evidence of mass lesion, intracranial hemorrhage or evidence of cortically based acute infarction. Gray-white matter differentiation is within normal limits throughout the brain. No encephalomalacia identified. Vascular: Mild Calcified atherosclerosis at the skull base. No suspicious intracranial vascular hyperdensity. Skull: Negative. Sinuses/Orbits:  Visualized paranasal sinuses and mastoids are stable and well pneumatized. Other: No acute orbit or scalp soft tissue findings. ASPECTS [facility] Stroke Program Early CT Score) Total score (0-10 with 10 being normal): 10 IMPRESSION: 1. Stable and normal noncontrast CT appearance of the brain. ASPECTS 10. 2. These results were communicated to Dr. Cheral Marker at 5:03 pm on 06/30/2020 by text page via the [facility] messaging system. Electronically Signed   By: Genevie Ann M.D.   On: 06/30/2020 17:03   VAS US CAROTID (at [facility] and [facility] only)  Result Date: 07/01/2020 Carotid Arterial Duplex Study Indications:       Speech disturbance and Headache. Risk Factors:      Hypertension, hyperlipidemia. Comparison Study:  Prior normal study from 11/16/16 is available for comparison Performing Technologist: Sharion Dove RVS  Examination Guidelines: A complete evaluation includes B-mode imaging, spectral Doppler, color Doppler, and power Doppler as needed of all accessible portions of each vessel. Bilateral testing is considered an integral part of a complete examination. Limited examinations for reoccurring indications may be performed as noted.  Right Carotid Findings: +----------+--------+--------+--------+------------------+------------------+           PSV cm/sEDV cm/sStenosisPlaque DescriptionComments           +----------+--------+--------+--------+------------------+------------------+ CCA Prox  87      19                                intimal thickening +----------+--------+--------+--------+------------------+------------------+ CCA Distal67      11                                intimal thickening +----------+--------+--------+--------+------------------+------------------+  ICA Prox  71      17                                                   +----------+--------+--------+--------+------------------+------------------+ ICA Distal91      32                                                    +----------+--------+--------+--------+------------------+------------------+ ECA       64      8                                                    +----------+--------+--------+--------+------------------+------------------+ +----------+--------+-------+--------+-------------------+           PSV cm/sEDV cmsDescribeArm Pressure (mmHG) +----------+--------+-------+--------+-------------------+ [id number]                                        +----------+--------+-------+--------+-------------------+ +---------+--------+--+--------+--+ VertebralPSV cm/s36EDV cm/s14 +---------+--------+--+--------+--+  Left Carotid Findings: +----------+--------+--------+--------+------------------+------------------+           PSV cm/sEDV cm/sStenosisPlaque DescriptionComments           +----------+--------+--------+--------+------------------+------------------+ CCA Prox  100     19                                intimal thickening +----------+--------+--------+--------+------------------+------------------+ CCA Distal84      17                                intimal thickening +----------+--------+--------+--------+------------------+------------------+ ICA Prox  76      13                                                   +----------+--------+--------+--------+------------------+------------------+ ICA Distal60      22                                                   +----------+--------+--------+--------+------------------+------------------+ ECA       102     20                                                   +----------+--------+--------+--------+------------------+------------------+ +----------+--------+--------+--------+-------------------+           PSV cm/sEDV cm/sDescribeArm Pressure (mmHG) +----------+--------+--------+--------+-------------------+ [id number]                                          +----------+--------+--------+--------+-------------------+ +---------+--------+--+--------+--+  VertebralPSV cm/s76EDV cm/s23 +---------+--------+--+--------+--+   Summary: Right Carotid: The extracranial vessels were near-normal with only minimal wall                thickening or plaque. Left Carotid: The extracranial vessels were near-normal with only minimal wall               thickening or plaque. Vertebrals:  Bilateral vertebral arteries demonstrate antegrade flow. Subclavians: Normal flow hemodynamics were seen in bilateral subclavian              arteries. *See table(s) above for measurements and observations.     Preliminary      Labs:   Basic Metabolic Panel: Recent Labs  Lab 06/30/20 1637 06/30/20 1637 06/30/20 1701 07/01/20 0206  NA 139  --  142 141  K 3.9   < > 3.9 3.7  CL 103  --  105 107  CO2 27  --   --  23  GLUCOSE 102*  --  97 105*  BUN 9  --  12 10  CREATININE 0.80  --  0.80 0.80  CALCIUM 9.9  --   --  9.5  MG  --   --   --  2.1   < > = values in this interval not displayed.   GFR Estimated Creatinine Clearance: 95.2 mL/min (by C-G formula based on SCr of 0.8 mg/dL). Liver Function Tests: Recent Labs  Lab 06/30/20 1637  AST 23  ALT 23  ALKPHOS 61  BILITOT 0.4  PROT 7.5  ALBUMIN 4.3   No results for input(s): LIPASE, AMYLASE in the last 168 hours. No results for input(s): AMMONIA in the last 168 hours. Coagulation profile Recent Labs  Lab 06/30/20 1637  INR 1.0    CBC: Recent Labs  Lab 06/30/20 1637 06/30/20 1701 07/01/20 0206  WBC 10.1  --  9.4  NEUTROABS 4.6  --   --   HGB 12.2 13.3 11.7*  HCT 38.8 39.0 36.7  MCV 91.3  --  91.1  PLT 340  --  332   Cardiac Enzymes: No results for input(s): CKTOTAL, CKMB, CKMBINDEX, TROPONINI in the last 168 hours. BNP: Invalid input(s): POCBNP CBG: Recent Labs  Lab 07/01/20 1053 07/01/20 1604 07/01/20 2126 07/02/20 0604 07/02/20 1107  GLUCAP 98 115* 106* 150* 149*   D-Dimer No results  for input(s): DDIMER in the last 72 hours. Hgb A1c Recent Labs    07/01/20 0206  HGBA1C 5.3   Lipid Profile Recent Labs    07/01/20 0206  CHOL 281*  HDL 44  LDLCALC 202*  TRIG 175*  CHOLHDL 6.4   Thyroid function studies Recent Labs    07/01/20 1309  TSH 1.077   Anemia work up No results for input(s): VITAMINB12, FOLATE, FERRITIN, TIBC, IRON, RETICCTPCT in the last 72 hours. Microbiology Recent Results (from the past 240 hour(s))  SARS Coronavirus 2 by RT PCR (hospital order, performed in [facility] hospital lab) Nasopharyngeal Nasopharyngeal Swab     Status: None   Collection Time: 06/30/20  8:06 PM   Specimen: Nasopharyngeal Swab  Result Value Ref Range Status   SARS Coronavirus 2 NEGATIVE NEGATIVE Final    Comment: (NOTE) SARS-CoV-2 target nucleic acids are NOT DETECTED.  The SARS-CoV-2 RNA is generally detectable in upper and lower respiratory specimens during the acute phase of infection. The lowest concentration of SARS-CoV-2 viral copies this assay can detect is 250 copies / mL. A negative result does not preclude SARS-CoV-2 infection and should  not be used as the sole basis for treatment or other patient management decisions.  A negative result may occur with improper specimen collection / handling, submission of specimen other than nasopharyngeal swab, presence of viral mutation(s) within the areas targeted by this assay, and inadequate number of viral copies (<250 copies / mL). A negative result must be combined with clinical observations, patient history, and epidemiological information.  Fact Sheet for Patients:   StrictlyIdeas.no  Fact Sheet for Healthcare Providers: BankingDealers.co.za  This test is not yet approved or  cleared by the [place] FDA and has been authorized for detection and/or diagnosis of SARS-CoV-2 by FDA under an Emergency Use Authorization (EUA).  This EUA will remain in  effect (meaning this test can be used) for the duration of the COVID-19 declaration under Section 564(b)(1) of the Act, 21 U.S.C. section 360bbb-3(b)(1), unless the authorization is terminated or revoked sooner.  Performed at [facility], [place] [address], [place], [place] [address]      Discharge Instructions:   Discharge Instructions    Diet - low sodium heart healthy   Complete by: As directed    Increase activity slowly   Complete by: As directed      Allergies as of 07/02/2020      Reactions   Detrol [tolterodine] Other (See Comments)   slurred speech, tremors, dizziness   Gabapentin Palpitations, Other (See Comments)   Wt gain, tremor   Aspirin Other (See Comments)   Avoids--- history of gastric ulcers    Contrast Media [iodinated Diagnostic Agents] Hives, Itching, Rash   CT contrast-Needed to take benadryl    Hydrocodone-acetaminophen Nausea And Vomiting   Imitrex [sumatriptan] Other (See Comments)   Body hurts   Oxycodone Nausea And Vomiting   Avelox [moxifloxacin] Itching      Medication List    TAKE these medications   acetaminophen 500 MG tablet Commonly known as: TYLENOL Take 1,000 mg by mouth every 6 (six) hours as needed for headache (pain).   clotrimazole-betamethasone cream Commonly known as: Lotrisone Apply 1 application topically 2 (two) times daily. What changed:   when to take this  reasons to take this   cyclobenzaprine 5 MG tablet Commonly known as: FLEXERIL Take 1 tablet (5 mg total) by mouth 2 (two) times daily as needed for muscle spasms.   dicyclomine 10 MG capsule Commonly known as: BENTYL Take 10 mg by mouth See admin instructions. Take one tablet (10 mg) by mouth daily at bedtime, may also take one tablet (10 mg) in the morning as needed for cramping/spasms   escitalopram 10 MG tablet Commonly known as: LEXAPRO Take 10 mg by mouth at bedtime.   fluocinonide 0.05 % external solution Commonly known as: LIDEX Apply 1  application topically daily.   pantoprazole 40 MG tablet Commonly known as: PROTONIX Take 40 mg by mouth at bedtime.   rosuvastatin 10 MG tablet Commonly known as: CRESTOR Take 10 mg by mouth daily.   VITAMIN D3 PO Take 1 tablet by mouth at bedtime.   vitamin E 1000 UNIT capsule Take 1,000 Units by mouth at bedtime.   Voltaren 1 % Gel Generic drug: diclofenac Sodium Apply 1 application topically 2 (two) times daily as needed (pain).       Follow-up Information    Carol Ada, MD Follow up in 1 week(s).   Specialty: Family Medicine Why: BP check and addition of medications if elevated Contact information: [address] [place] [address]  [phone]        Ashok Pall, MD. Schedule an appointment as soon as possible for a visit.   Specialty: Neurosurgery Contact information: [address] [place] [address] [phone]                Time coordinating discharge: 25 min  Signed:  Geradine Girt DO  [facility] 07/02/2020, 1:17 PM

## 2020-07-04 ENCOUNTER — Ambulatory Visit: Payer: PPO | Admitting: Podiatry

## 2020-07-04 DIAGNOSIS — R519 Headache, unspecified: Secondary | ICD-10-CM | POA: Diagnosis not present

## 2020-07-04 DIAGNOSIS — G8929 Other chronic pain: Secondary | ICD-10-CM | POA: Diagnosis not present

## 2020-07-04 DIAGNOSIS — M5030 Other cervical disc degeneration, unspecified cervical region: Secondary | ICD-10-CM | POA: Diagnosis not present

## 2020-07-04 DIAGNOSIS — F5101 Primary insomnia: Secondary | ICD-10-CM | POA: Diagnosis not present

## 2020-07-24 ENCOUNTER — Telehealth: Payer: Self-pay | Admitting: Pulmonary Disease

## 2020-07-24 NOTE — Telephone Encounter (Signed)
Patient checking on letter for CPAP machine. Patient phone number is [phone].

## 2020-07-24 NOTE — Telephone Encounter (Signed)
Letter completed for patient, called patient to notify her and she informed me that she was actually in the lobby. Letter printed and given to patient. Nothing further needed at this time.

## 2020-07-24 NOTE — Telephone Encounter (Signed)
Okay to provide letter stating that she is under our care for obstructive sleep apnea and uses a CPAP machine which is a medical device

## 2020-07-24 NOTE — Telephone Encounter (Signed)
Called and spoke with patient she states she needs to get letter about her cpap machine, as she is flying tomorrow states it needs to say it's a medical device. Please call when ready to pick up. [phone]   Dr. Elsworth Soho are you ok with [place] writing letter for this patient?

## 2020-08-03 DIAGNOSIS — M25512 Pain in left shoulder: Secondary | ICD-10-CM | POA: Diagnosis not present

## 2020-08-03 DIAGNOSIS — Z91041 Radiographic dye allergy status: Secondary | ICD-10-CM | POA: Diagnosis not present

## 2020-08-03 DIAGNOSIS — Z881 Allergy status to other antibiotic agents status: Secondary | ICD-10-CM | POA: Diagnosis not present

## 2020-08-03 DIAGNOSIS — M4312 Spondylolisthesis, cervical region: Secondary | ICD-10-CM | POA: Diagnosis not present

## 2020-08-03 DIAGNOSIS — M542 Cervicalgia: Secondary | ICD-10-CM | POA: Diagnosis not present

## 2020-08-03 DIAGNOSIS — M5412 Radiculopathy, cervical region: Secondary | ICD-10-CM | POA: Diagnosis not present

## 2020-08-03 DIAGNOSIS — M5030 Other cervical disc degeneration, unspecified cervical region: Secondary | ICD-10-CM | POA: Diagnosis not present

## 2020-08-03 DIAGNOSIS — Z886 Allergy status to analgesic agent status: Secondary | ICD-10-CM | POA: Diagnosis not present

## 2020-08-03 DIAGNOSIS — M4722 Other spondylosis with radiculopathy, cervical region: Secondary | ICD-10-CM | POA: Diagnosis not present

## 2020-08-03 DIAGNOSIS — M47813 Spondylosis without myelopathy or radiculopathy, cervicothoracic region: Secondary | ICD-10-CM | POA: Diagnosis not present

## 2020-08-03 DIAGNOSIS — Z981 Arthrodesis status: Secondary | ICD-10-CM | POA: Diagnosis not present

## 2020-08-03 DIAGNOSIS — Z885 Allergy status to narcotic agent status: Secondary | ICD-10-CM | POA: Diagnosis not present

## 2020-08-03 DIAGNOSIS — Z888 Allergy status to other drugs, medicaments and biological substances status: Secondary | ICD-10-CM | POA: Diagnosis not present

## 2020-09-01 DIAGNOSIS — Z01411 Encounter for gynecological examination (general) (routine) with abnormal findings: Secondary | ICD-10-CM | POA: Diagnosis not present

## 2020-09-01 DIAGNOSIS — N951 Menopausal and female climacteric states: Secondary | ICD-10-CM | POA: Diagnosis not present

## 2020-09-01 DIAGNOSIS — E669 Obesity, unspecified: Secondary | ICD-10-CM | POA: Diagnosis not present

## 2020-09-12 ENCOUNTER — Ambulatory Visit: Attending: Internal Medicine

## 2020-09-12 DIAGNOSIS — Z23 Encounter for immunization: Secondary | ICD-10-CM

## 2020-09-12 NOTE — Progress Notes (Signed)
   Covid-19 Vaccination Clinic  Name:  Paula [name]    MRN: [id number] DOB: December 17, 1960  09/12/2020  [name] was observed post Covid-19 immunization for   Covid-19 Vaccination Clinic  Name:  Paula [name]    MRN: [id number] DOB: 09-16-1961  09/12/2020  [name] was observed post Covid-19 immunization for 15 minutes without incident. She was provided with Vaccine Information Sheet and instruction to access the V-Safe system.   [name] was instructed to call 911 with any severe reactions post vaccine: [name] Difficulty breathing  . Swelling of face and throat  . A fast heartbeat  . A bad rash all over body  . Dizziness and weakness     without incident. She was provided with Vaccine Information Sheet and instruction to access the V-Safe system.   [name] was instructed to call 911 with any severe reactions post vaccine: [name] Difficulty breathing  . Swelling of face and throat  . A fast heartbeat  . A bad rash all over body  . Dizziness and weakness

## 2020-10-05 ENCOUNTER — Other Ambulatory Visit: Payer: Self-pay | Admitting: Family Medicine

## 2020-10-05 DIAGNOSIS — Z1231 Encounter for screening mammogram for malignant neoplasm of breast: Secondary | ICD-10-CM

## 2020-10-19 DIAGNOSIS — E782 Mixed hyperlipidemia: Secondary | ICD-10-CM | POA: Diagnosis not present

## 2020-10-19 DIAGNOSIS — R7303 Prediabetes: Secondary | ICD-10-CM | POA: Diagnosis not present

## 2020-10-19 DIAGNOSIS — K219 Gastro-esophageal reflux disease without esophagitis: Secondary | ICD-10-CM | POA: Diagnosis not present

## 2020-10-19 DIAGNOSIS — Z1389 Encounter for screening for other disorder: Secondary | ICD-10-CM | POA: Diagnosis not present

## 2020-10-19 DIAGNOSIS — F411 Generalized anxiety disorder: Secondary | ICD-10-CM | POA: Diagnosis not present

## 2020-10-19 DIAGNOSIS — Z0000 Encounter for general adult medical examination without abnormal findings: Secondary | ICD-10-CM | POA: Diagnosis not present

## 2020-10-19 DIAGNOSIS — I10 Essential (primary) hypertension: Secondary | ICD-10-CM | POA: Diagnosis not present

## 2020-10-19 DIAGNOSIS — I700 Atherosclerosis of aorta: Secondary | ICD-10-CM | POA: Diagnosis not present

## 2020-10-19 DIAGNOSIS — M25512 Pain in left shoulder: Secondary | ICD-10-CM | POA: Diagnosis not present

## 2020-10-30 DIAGNOSIS — M4692 Unspecified inflammatory spondylopathy, cervical region: Secondary | ICD-10-CM | POA: Diagnosis not present

## 2020-10-30 DIAGNOSIS — M67912 Unspecified disorder of synovium and tendon, left shoulder: Secondary | ICD-10-CM | POA: Diagnosis not present

## 2020-11-06 ENCOUNTER — Encounter: Payer: Self-pay | Admitting: *Deleted

## 2020-11-07 ENCOUNTER — Encounter: Payer: Self-pay | Admitting: Neurology

## 2020-11-07 ENCOUNTER — Other Ambulatory Visit: Payer: Self-pay

## 2020-11-07 ENCOUNTER — Ambulatory Visit: Payer: PPO | Admitting: Diagnostic Neuroimaging

## 2020-11-08 ENCOUNTER — Other Ambulatory Visit: Payer: Self-pay

## 2020-11-08 ENCOUNTER — Ambulatory Visit (INDEPENDENT_AMBULATORY_CARE_PROVIDER_SITE_OTHER): Payer: PPO | Admitting: Neurology

## 2020-11-08 ENCOUNTER — Encounter: Payer: Self-pay | Admitting: Neurology

## 2020-11-08 VITALS — BP 141/78 | HR 76 | Ht 65.0 in | Wt 258.5 lb

## 2020-11-08 DIAGNOSIS — F444 Conversion disorder with motor symptom or deficit: Secondary | ICD-10-CM

## 2020-11-08 NOTE — Progress Notes (Signed)
[facility]  PATIENT: Paula [name] DOB: October 24, 1961  REFERRING DOCTOR OR PCP: Carol Ada, MD SOURCE: Patient, notes from primary care, notes from recent hospital admission, notes from previous neurologic visits MRI and lab reports reviewed, MRI images personally reviewed.  _________________________________   HISTORICAL  CHIEF COMPLAINT:  Chief Complaint  Patient presents with  . New Patient (Initial Visit)    RM 87 with husband. Paper referral from Carol Ada, MD (PCP). Hx anxiety/mouth twisting/eye watering/tremulousness. All sx on left side. Gets slurred speech/left facial droop. This started 06/30/20. Has trembling with these episodes. Most recent episode two nights ago. Come with no warning or trigger. Has constant pain on base of skull on left side. Goes into base of neck and into jaw/temple. Has pain in left arm. Had cortisone shot in left shoulder which made sx worse.     HISTORY OF PRESENT ILLNESS:  I had the pleasure seeing your patient, Paula [name], [facility] for a neurologic consultation regarding her episodes of tremor, slurred speech and abnormal eye movements  She is a 59 year old woman who has episodes of tremors and other neurologic symptoms over the last few years.  Her first episode was in 2015.  According to notes in the medical record, the first episode occurred the day after she began to take a weight loss cocktail containing lemon, cayenne pepper, vinegar.  She experienced tremors after the second dose.  In the morning she had neck and shoulder spasms and she presented to the emergency room.  She followed up with Dr. Carles Collet of [facility] neurology.  Due to the nature of the episode it was felt that she had psychogenic spells.  Additional testing was performed.  EEG was normal but 24-hour EEG did show some asymmetry with left temporal slowing.  MRI was essentially normal for age.  She was tried on several medications including  propranolol, gabapentin and clonazepam without benefit.  Because of persistent intermittent spells she was referred to the movement disorder clinic at [facility] and saw Dr. Anna Genre in August 2015.  He also felt a psychogenic movement disorder was most likely.  In 2017 she saw Dr. Leta Baptist  who also felt symptoms would be most consistent with a psychogenic movement disorder.  Over the last few years she has continued to have some episodes.  Most of these were milder.  On 06/30/2020, she had a more significant episode that lasted several days.  She was having her extraocular muscles tested and looked one way or the other way.  She suddenly starting having jerking in the eyes, abnormal eye movements, tremors, slurred speech and facial droop.  She also have left occipital headache.  She reports that she has had similar triggering of neurolgic symptoms with EOM testing in the past as well.   [facility] went to [facility].  While there she had MRI of the brain, MR angiogram of the intracerebral arteries and MRI of the cervical spine.  I personally reviewed these images.  The MRI of the brain is normal for age.  MR angiogram was normal.  MRI of the cervical spine showed postsurgical changes from prior decompression and anterior fusion with corpectomy and strut graft at C4-C7.  There appeared to be solid fusion.  At C3-C4 there was moderate spinal stenosis due to degenerative changes with milder adjacent disease at C7-T1 that did not lead to spinal stenosis.  That episode had on/off symptoms x 3 days.  She was discharged home.  She reports that the episode was similar to others but was longer.  However, if an episodes lasts > 1 day, she does not know if the movements improve with sleep.   She does not feel sleep helps.     Her last similar episode was 2 nights ago and lasted just last 30 minutes  Current medications were reviewed.  She is not on any neuroleptics.  She is on escitalopram for mood and has been on this  medication for a year (episodes were present before she started)  While testing EOM today she had an episode.   While looking to the left, she had dysconjugate gaze then converged the eyes and then returned to a neurtral gaze.  She had a side-to-side shaking of the head with some distractibility and modulation of frequency.  She slurred her words and started shaking her head.  Speech change is more of a scanning speech rather than actual slurring.    She notes when she sticks tongue out she does better.       REVIEW OF SYSTEMS: Constitutional: No fevers, chills, sweats, or change in appetite Eyes: No visual changes, double vision, eye pain Ear, nose and throat: No hearing loss, ear pain, nasal congestion, sore throat Cardiovascular: No chest pain, palpitations Respiratory: No shortness of breath at rest or with exertion.   No wheezes.  OSA GastrointestinaI: No nausea, vomiting, diarrhea, abdominal pain, fecal incontinence Genitourinary: No dysuria, urinary retention or frequency.  No nocturia. Musculoskeletal:Neck pain and history of surgery as above Integumentary: No rash, pruritus, skin lesions Neurological: as above Psychiatric: As above Endocrine: No palpitations, diaphoresis, change in appetite, change in weigh or increased thirst Hematologic/Lymphatic: No anemia, purpura, petechiae. Allergic/Immunologic: No itchy/runny eyes, nasal congestion, recent allergic reactions, rashes  ALLERGIES: Allergies  Allergen Reactions  . Detrol [Tolterodine] Other (See Comments)    slurred speech, tremors, dizziness  . Gabapentin Palpitations and Other (See Comments)    Wt gain, tremor  . Aspirin Other (See Comments)    Avoids--- history of gastric ulcers   . Contrast Media [Iodinated Diagnostic Agents] Hives, Itching and Rash    CT contrast-Needed to take benadryl   . Hydrocodone-Acetaminophen Nausea And Vomiting  . Imitrex [Sumatriptan] Other (See Comments)    Body hurts  . Oxycodone  Nausea And Vomiting  . Avelox [Moxifloxacin] Itching    HOME MEDICATIONS:  Current Outpatient Medications:  .  acetaminophen (TYLENOL) 500 MG tablet, Take 1,000 mg by mouth every 6 (six) hours as needed for headache (pain)., Disp: , Rfl:  .  Cholecalciferol (VITAMIN D3 PO), Take 1 tablet by mouth at bedtime., Disp: , Rfl:  .  clotrimazole-betamethasone (LOTRISONE) cream, Apply 1 application topically 2 (two) times daily. (Patient taking differently: Apply 1 application topically 2 (two) times daily as needed (athlete's feet break out). ), Disp: 30 g, Rfl: 0 .  cyclobenzaprine (FLEXERIL) 5 MG tablet, Take 1 tablet (5 mg total) by mouth 2 (two) times daily as needed for muscle spasms., Disp: 12 tablet, Rfl: 0 .  diclofenac Sodium (VOLTAREN) 1 % GEL, Apply 1 application topically 2 (two) times daily as needed (pain)., Disp: , Rfl:  .  dicyclomine (BENTYL) 10 MG capsule, Take 10 mg by mouth See admin instructions. Take one tablet (10 mg) by mouth daily at bedtime, may also take one tablet (10 mg) in the morning as needed for cramping/spasms, Disp: , Rfl: 1 .  escitalopram (LEXAPRO) 10 MG tablet, Take 10 mg by mouth at bedtime. , Disp: ,  Rfl:  .  fluocinonide (LIDEX) 0.05 % external solution, Apply 1 application topically daily. , Disp: , Rfl:  .  pantoprazole (PROTONIX) 40 MG tablet, Take 40 mg by mouth at bedtime. , Disp: , Rfl:  .  rosuvastatin (CRESTOR) 10 MG tablet, Take 10 mg by mouth daily., Disp: , Rfl:  .  vitamin E 1000 UNIT capsule, Take 1,000 Units by mouth at bedtime., Disp: , Rfl:   PAST MEDICAL HISTORY: Past Medical History:  Diagnosis Date  . Anxiety   . Chronic lower back pain   . Conversion disorder   . Depression   . Diverticulitis   . Expressive aphasia 11/15/2016  . Fibromyalgia   . GERD (gastroesophageal reflux disease)   . High cholesterol   . History of gastric ulcer   . History of hiatal hernia   . History of kidney stones   . History of thyroid nodule   .  Hyperlipidemia   . Hypertension   . IBS (irritable bowel syndrome)   . Intermittent vertigo   . Migraine    "a few times/year now; maybe" (11/15/2016)  . Obesity   . OSA on CPAP    wears CPAP nightly ;study in epic 02-01-2014 (11/15/2016)  . Osteoarthritis    "knees, hands, back, hips" (11/15/2016)  . Pneumonia X 1   hx of  . Psychogenic tremor    intermittant head tremor-(11/15/2016)  . Refusal of blood transfusions as patient is Jehovah's Witness   . RLS (restless legs syndrome)   . Sleep apnea    CPAP  . Urinary incontinence   . Vertigo     PAST SURGICAL HISTORY: Past Surgical History:  Procedure Laterality Date  . ABDOMINAL HYSTERECTOMY  1990  . ANTERIOR CERVICAL DECOMP/DISCECTOMY FUSION  02-10-2004   C4 -- C7  . BACK SURGERY    . CARDIOVASCULAR STRESS TEST  2017   Negative  . CESAREAN SECTION  1977; 1982  . COLONOSCOPY    . CYSTOSCOPY W/ RETROGRADES Right 06/07/2015   Procedure: CYSTOSCOPY WITH RETROGRADE PYELOGRAM;  Surgeon: Ardis Hughs, MD;  Location: [facility];  Service: Urology;  Laterality: Right;  . CYSTOSCOPY WITH URETEROSCOPY AND STENT PLACEMENT Right 06/07/2015   Procedure: RIGHT URETEROSCOPY, LASER LITHOTRIPSY, STONE REMOVAL AND RIGHT URETERAL STENT PLACEMENT;  Surgeon: Ardis Hughs, MD;  Location: [facility];  Service: Urology;  Laterality: Right;  . HERNIA REPAIR    . HOLMIUM LASER APPLICATION N/A 03/03/7407   Procedure: HOLMIUM LASER APPLICATION;  Surgeon: Ardis Hughs, MD;  Location: [facility];  Service: Urology;  Laterality: N/A;  . LAPAROSCOPIC CHOLECYSTECTOMY  10-07-2000  . LIPOMA EXCISION  01/2017   from neck  . UMBILICAL HERNIA REPAIR  age 1  . UPPER GI ENDOSCOPY      FAMILY HISTORY: Family History  Problem Relation Age of Onset  . Colon cancer Maternal Grandfather   . Hypertension Maternal Grandfather   . Lung cancer Maternal Grandfather   . Throat cancer Maternal Grandfather    . Hyperlipidemia Mother   . Hypertension Mother   . Sleep apnea Mother   . Osteoporosis Mother   . Hypertension Father   . Heart disease Father   . Syncope episode Father   . Colon cancer Father   . Atrial fibrillation Father   . Lung cancer Maternal Aunt   . Hypertension Sister   . Sleep apnea Sister   . Sleep apnea Brother   . Colon cancer Paternal Grandfather   .  Hypertension Maternal Grandmother   . CVA Maternal Grandmother   . Hypertension Brother     SOCIAL HISTORY:  Social History   Socioeconomic History  . Marital status: Married    Spouse name: Paula [name]  . Number of children: 2  . Years of education: 63  . Highest education level: Not on file  Occupational History  . Occupation: disabled  Tobacco Use  . Smoking status: Former Smoker    Packs/day: 0.50    Years: 25.00    Pack years: 12.50    Types: Cigarettes    Quit date: 07/24/2008    Years since quitting: 12.3  . Smokeless tobacco: Never Used  Vaping Use  . Vaping Use: Never used  Substance and Sexual Activity  . Alcohol use: Yes    Alcohol/week: 0.0 standard drinks    Comment: 5 drinks per year or less  . Drug use: No  . Sexual activity: Yes  Other Topics Concern  . Not on file  Social History Narrative   Married, lives at home      Caffeine use - coffee daily (decaf), tea once a week      Jehovah's Witness - no blood products   Right handed   Social Determinants of Health   Financial Resource Strain:   . Difficulty of Paying Living Expenses: Not on file  Food Insecurity:   . Worried About Charity fundraiser in the Last Year: Not on file  . Ran Out of Food in the Last Year: Not on file  Transportation Needs:   . Lack of Transportation (Medical): Not on file  . Lack of Transportation (Non-Medical): Not on file  Physical Activity:   . Days of Exercise per Week: Not on file  . Minutes of Exercise per Session: Not on file  Stress:   . Feeling of Stress : Not on file  Social  Connections:   . Frequency of Communication with Friends and Family: Not on file  . Frequency of Social Gatherings with Friends and Family: Not on file  . Attends Religious Services: Not on file  . Active Member of Clubs or Organizations: Not on file  . Attends Archivist Meetings: Not on file  . Marital Status: Not on file  Intimate Partner Violence:   . Fear of Current or Ex-Partner: Not on file  . Emotionally Abused: Not on file  . Physically Abused: Not on file  . Sexually Abused: Not on file     PHYSICAL EXAM  Vitals:   11/08/20 1311  BP: (!) 141/78  Pulse: 76  SpO2: 99%  Weight: 258 lb 8 oz (117.3 kg)  Height: 5\' 5"  (1.651 m)    Body mass index is 43.02 kg/m.   General: The patient is well-developed and well-nourished and in no acute distress  HEENT:  Head is [place]/AT.  Sclera are anicteric.    Neck: No carotid bruits are noted.  The neck is nontender.  Cardiovascular: The heart has a regular rate and rhythm with a normal S1 and S2. There were no murmurs, gallops or rubs.    Skin: Extremities are without rash or  edema.  Neurologic Exam  Mental status: The patient is alert and oriented x 3 at the time of the examination. The patient has apparent normal recent and remote memory, with an apparently normal attention span and concentration ability.   Speech is normal.  Cranial nerves: Extraocular movements are full.  While being tested, when she looked to the left  initially, she had dysconjugate gaze then converged the eyes and then returned to a neurtral gaze.  I then was able to have her complete the extraocular muscle testing moving very slowly.  She had a side-to-side shaking of the head with some distractibility and modulation of frequency.  She had mild speech dysfunction with more of a scanning speech rather than actual slurring.      Pupils are equal, round, and reactive to light and accomodation.  Visual fields are full.  Facial symmetry is present.  There is good facial sensation to soft touch bilaterally.Facial strength is normal.  Trapezius and sternocleidomastoid strength is normal. No dysarthria is noted.  The tongue is midline, and the patient has symmetric elevation of the soft palate. No obvious hearing deficits are noted.  Motor:  Muscle bulk is normal.   Tone is normal. Strength is  5 / 5 in all 4 extremities.   Sensory: Sensory testing is intact to pinprick, soft touch and vibration sensation in all 4 extremities.  Coordination: Cerebellar testing reveals good finger-nose-finger and heel-to-shin bilaterally.  Gait and station: Station is normal.   Gait is normal. Tandem gait is normal. Romberg is negative.   Reflexes: Deep tendon reflexes are symmetric and normal bilaterally.        DIAGNOSTIC DATA (LABS, IMAGING, TESTING) - I reviewed patient records, labs, notes, testing and imaging myself where available.  Lab Results  Component Value Date   WBC 9.4 07/01/2020   HGB 11.7 (L) 07/01/2020   HCT 36.7 07/01/2020   MCV 91.1 07/01/2020   PLT 332 07/01/2020      Component Value Date/Time   NA 141 07/01/2020 0206   NA 142 08/12/2018 1143   K 3.7 07/01/2020 0206   CL 107 07/01/2020 0206   CO2 23 07/01/2020 0206   GLUCOSE 105 (H) 07/01/2020 0206   BUN 10 07/01/2020 0206   BUN 13 08/12/2018 1143   CREATININE 0.80 07/01/2020 0206   CALCIUM 9.5 07/01/2020 0206   PROT 7.5 06/30/2020 1637   PROT 7.4 08/12/2018 1143   ALBUMIN 4.3 06/30/2020 1637   ALBUMIN 4.9 08/12/2018 1143   AST 23 06/30/2020 1637   ALT 23 06/30/2020 1637   ALKPHOS 61 06/30/2020 1637   BILITOT 0.4 06/30/2020 1637   BILITOT 0.4 08/12/2018 1143   GFRNONAA >60 07/01/2020 0206   GFRAA >60 07/01/2020 0206   Lab Results  Component Value Date   CHOL 281 (H) 07/01/2020   HDL 44 07/01/2020   LDLCALC 202 (H) 07/01/2020   LDLDIRECT 170.1 01/08/2010   TRIG 175 (H) 07/01/2020   CHOLHDL 6.4 07/01/2020   Lab Results  Component Value Date   HGBA1C 5.3  07/01/2020   Lab Results  Component Value Date   VITAMINB12 255 11/28/2014   Lab Results  Component Value Date   TSH 1.077 07/01/2020       ASSESSMENT AND PLAN  Psychogenic movement disorder   In summary, Ms. Milan is a 59 year old woman who has had multiple episodes of unusual spells.  I discussed with her and her husband that I believe the spells are likely due to a psychogenic movement disorder due to the nature of the movements, the distractibility and the modulation of the tremor frequency.  I did let them know that movement disorders is not my specialty as I subspecializes in neuro immunology.  She asked if symptoms could be due to MS but I am that this is very unlikely as the MRI is essentially normal for  age.  The eye movements are not typical of conversion spasms or other ocular movement disorder.  Therefore, I do not think referral to neuro-ophthalmology would be useful.  I discussed referral to psychiatry or counseling for stress management.  In the past, she has turndown counseling or psychiatry referral and is not interested at this time.  She will return as needed for new or worsening neurologic symptoms.  Thank you for asking me to see [name].   Keileigh Vahey A. Felecia Shelling, MD, [facility] 12/07/4288, 3:79 PM Certified in Neurology, Clinical Neurophysiology, Sleep Medicine and Neuroimaging  [facility] [address] [place], [place] [address] [phone]

## 2020-11-14 ENCOUNTER — Other Ambulatory Visit: Payer: Self-pay

## 2020-11-14 ENCOUNTER — Ambulatory Visit
Admission: RE | Admit: 2020-11-14 | Discharge: 2020-11-14 | Disposition: A | Discharge status: Home / Self Care | Source: Ambulatory Visit | Attending: Family Medicine | Admitting: Family Medicine

## 2020-11-14 DIAGNOSIS — Z1231 Encounter for screening mammogram for malignant neoplasm of breast: Secondary | ICD-10-CM

## 2020-11-23 ENCOUNTER — Other Ambulatory Visit: Payer: Self-pay | Admitting: Podiatry

## 2020-11-27 NOTE — Telephone Encounter (Signed)
Please Advise

## 2020-12-12 DIAGNOSIS — R6889 Other general symptoms and signs: Secondary | ICD-10-CM | POA: Diagnosis not present

## 2020-12-13 ENCOUNTER — Other Ambulatory Visit

## 2020-12-13 DIAGNOSIS — Z20822 Contact with and (suspected) exposure to covid-19: Secondary | ICD-10-CM

## 2020-12-15 LAB — NOVEL CORONAVIRUS, NAA: SARS-CoV-2, NAA: NOT DETECTED

## 2020-12-15 LAB — SARS-COV-2, NAA 2 DAY TAT

## 2021-01-02 ENCOUNTER — Telehealth: Payer: Self-pay | Admitting: Pulmonary Disease

## 2021-01-02 NOTE — Telephone Encounter (Signed)
Called and spoke to pt. Pt states her CPAP has displayed an error message but is still working. Advised pt to contact her DME so they can fix the issue. Advised pt to call us back if there are any issues. Pt verbalized understanding and denied any further questions or concerns at this time.

## 2021-01-03 NOTE — Telephone Encounter (Signed)
Pt has not been seen at office since 09/22/18. Pt will need appt prior to [place] able to place order for cpap. Since it has not yet been 3 years, pt's appt can be with an APP.  Called and spoke with pt letting her know that we would need to get her in for an appt prior to [place] able to place order for new cpap due to the last time she was seen at office. Pt verbalized understanding. appt scheduled for pt tomorrow 2/3 with Aaron Edelman. Nothing further needed.

## 2021-01-04 ENCOUNTER — Encounter: Payer: Self-pay | Admitting: Pulmonary Disease

## 2021-01-04 ENCOUNTER — Ambulatory Visit (INDEPENDENT_AMBULATORY_CARE_PROVIDER_SITE_OTHER): Payer: Medicare HMO | Admitting: Pulmonary Disease

## 2021-01-04 ENCOUNTER — Other Ambulatory Visit: Payer: Self-pay

## 2021-01-04 ENCOUNTER — Ambulatory Visit (INDEPENDENT_AMBULATORY_CARE_PROVIDER_SITE_OTHER): Payer: Medicare HMO

## 2021-01-04 VITALS — BP 120/80 | HR 68 | Temp 97.0°F | Ht 64.5 in | Wt 258.4 lb

## 2021-01-04 DIAGNOSIS — Z8616 Personal history of COVID-19: Secondary | ICD-10-CM | POA: Insufficient documentation

## 2021-01-04 DIAGNOSIS — R059 Cough, unspecified: Secondary | ICD-10-CM | POA: Diagnosis not present

## 2021-01-04 DIAGNOSIS — G4733 Obstructive sleep apnea (adult) (pediatric): Secondary | ICD-10-CM | POA: Diagnosis not present

## 2021-01-04 DIAGNOSIS — K219 Gastro-esophageal reflux disease without esophagitis: Secondary | ICD-10-CM

## 2021-01-04 DIAGNOSIS — Z9989 Dependence on other enabling machines and devices: Secondary | ICD-10-CM

## 2021-01-04 MED ORDER — BENZONATATE 200 MG PO CAPS: 200.0000 mg | ORAL_CAPSULE | Freq: Three times a day (TID) | ORAL | 3 refills | Status: DC | PRN

## 2021-01-04 MED ORDER — FAMOTIDINE 20 MG PO TABS: 20.0000 mg | ORAL_TABLET | Freq: Every day | ORAL | 3 refills | Status: DC

## 2021-01-04 NOTE — Patient Instructions (Addendum)
You were seen today by Lauraine Rinne, NP  for:   1. History of COVID-19 2. Cough  - Ambulatory referral to Gastroenterology - DG Chest 2 View; Future - benzonatate (TESSALON) 200 MG capsule; Take 1 capsule (200 mg total) by mouth 3 (three) times daily as needed for cough.  Dispense: 30 capsule; Refill: 3  Okay to utilize [employer] every 8 hours as needed for management of cough  Chest x-ray today  Okay to continue over-the-counter cough medication such as Delsym  Cough Home Instructions:  We believe you have a chronic/cyclical cough that is aggravated by reflux , coughing , and drainage.  . Goal is to not Cough or clear throat.  [name] Avoid coughing or clearing throat by using:  o non-mint products/sugarless candy o Water o ice chips o Remember NO MINT PRODUCTS  . Medications to use:  o Mucinex DM 1-2 every 12 hrs or Delsym 2 tsp every 12 hrs for cough (These are Over the counter) o Tessalon Three times a day  As needed  Cough.  o Protonix  40mg  30 min before breakfast   o Pepcid 20mg  before dinner    Please start taking a daily antihistamine:  >>>choose one of: zyrtec, claritin, allegra, or xyzal  >>>these are over the counter medications  >>>can choose generic option  >>>take daily  >>>this medication helps with allergies, post nasal drip, and cough    3. Gastroesophageal reflux disease without esophagitis  - Ambulatory referral to Gastroenterology  Protonix 40 mg tablet  >>>Please take 1 tablet daily 15 minutes to 30 minutes before your first meal of the day as well as before your other medications >>>Try to take at the same time each day >>>take this medication daily  Start Pepcid 20 mg in the evening  GERD management: >>>Avoid laying flat until 2 hours after meals >>>Elevate head of the bed including entire chest >>>Reduce size of meals and amount of fat, acid, spices, caffeine and sweets >>>If you are smoking, Please stop! >>>Decrease alcohol  consumption >>>Work on maintaining a healthy weight with normal BMI      4. OSA on CPAP  - Ambulatory Referral for DME  We recommend that you continue using your CPAP daily >>>Keep up the hard work using your device >>> Goal should be wearing this for the entire night that you are sleeping, at least 4 to 6 hours  Remember:  . Do not drive or operate heavy machinery if tired or drowsy.  . Please notify the supply company and office if you are unable to use your device regularly due to missing supplies or machine being broken.  . Work on maintaining a healthy weight and following your recommended nutrition plan  . Maintain proper daily exercise and movement  . Maintaining proper use of your device can also help improve management of other chronic illnesses such as: Blood pressure, blood sugars, and weight management.   BiPAP/ CPAP Cleaning:  >>>Clean weekly, with Dawn soap, and bottle brush.  Set up to air dry. >>> Wipe mask out daily with wet wipe or towelette    We recommend today:  Orders Placed This Encounter  Procedures  . DG Chest 2 View    Standing Status:   Future    Standing Expiration Date:   05/04/2021    Order Specific Question:   Reason for Exam (SYMPTOM  OR DIAGNOSIS REQUIRED)    Answer:   cough, c19 in 12/21    Order Specific Question:  Preferred imaging location?    Answer:   Internal    Order Specific Question:   Radiology Contrast Protocol - do NOT remove file path    Answer:   [email or web address]  . Ambulatory Referral for DME    Referral Priority:   Routine    Referral Type:   Durable Medical Equipment Purchase    Number of Visits Requested:   1  . Ambulatory referral to Gastroenterology    Referral Priority:   Routine    Referral Type:   Consultation    Referral Reason:   Specialty Services Required    Number of Visits Requested:   1   Orders Placed This Encounter  Procedures  . DG Chest 2 View   . Ambulatory Referral for DME  . Ambulatory referral to Gastroenterology   Meds ordered this encounter  Medications  . benzonatate (TESSALON) 200 MG capsule    Sig: Take 1 capsule (200 mg total) by mouth 3 (three) times daily as needed for cough.    Dispense:  30 capsule    Refill:  3  . famotidine (PEPCID) 20 MG tablet    Sig: Take 1 tablet (20 mg total) by mouth at bedtime.    Dispense:  30 tablet    Refill:  3    Follow Up:    Return in about 3 months (around 04/03/2021), or if symptoms worsen or fail to improve, for Follow up with Dr. Elsworth Soho.   Notification of test results are managed in the following manner: If there are  any recommendations or changes to the  plan of care discussed in office today,  we will contact you and let you know what they are. If you do not hear from [place], then your results are normal and you can view them through your  MyChart account , or a letter will be sent to you. Thank you again for trusting [place] with your care  - Thank you, [facility]    It is flu season:   >>> Best ways to protect herself from the flu: Receive the yearly flu vaccine, practice good hand hygiene washing with soap and also using hand sanitizer when available, eat a nutritious meals, get adequate rest, hydrate appropriately       Please contact the office if your symptoms worsen or you have concerns that you are not improving.   Thank you for choosing  [facility] for your healthcare, and for allowing [place] to partner with you on your healthcare journey. I am thankful to be able to provide care to you today.   Wyn Quaker FNP-C

## 2021-01-04 NOTE — Progress Notes (Signed)
 @Patient  ID: Paula ParrNadine Y [name], female    DOB: 1961-11-14, 60 y.o.   MRN: [id number]  Chief Complaint  Patient presents with  . Follow-up    Pt last seen at office 09/2018. Pt states she is needing order to be placed for new cpap machine.  Pt states she has been okay since last visit. States she is only in the office now due to having issues with her cpap machine. Pt also states she has had a cough. Pt did test positive to covid December 2021 and has had a cough since.    Referring provider: Merri BrunetteSmith, Candace, MD  HPI: 60 y.o. female former smoker followed for OSA  PMH: Thyroid nodule, anxiety, obesity, HTN, GERD, DM, TIA, Dyslipidemia, Fibromyalgia Smoker/ Smoking History: Former Smoker. Quit 2009. 12.5 pack year history.  Maintenance:  None Pt of: Dr. Vassie LollAlva  01/04/2021  -   60 y.o. female presents today for OSA/CPAP 1 year follow up.Last seen 09/2018. CPAP compliance report 12/05/20-01/03/21: 30/30 days usage. Autoset 5-12 cmH2O. AHI 0.7. Verbalizes CPAP machine is currently reading error message still able to use. Will address today.   Flare up of her chronic cough after testing positive for Covid in 11/2020. Did not require hospitalization, or monoclonal antibody infusion. Cough worsens with deep inspiration and meals.    Currently taking Protonix in evening. Denies any GERD symptoms. Experiencing worsening coughing with eating and a choking sensation.    Tests:   FENO:  Lab Results  Component Value Date   NITRICOXIDE 9 04/24/2018    PFT: PFT Results Latest Ref Rng & Units 11/08/2015  FVC-Pre L 2.44  FVC-Predicted Pre % 88  FVC-Post L 2.37  FVC-Predicted Post % 85  Pre FEV1/FVC % % 91  Post FEV1/FCV % % 92  FEV1-Pre L 2.22  FEV1-Predicted Pre % 101  FEV1-Post L 2.18  DLCO uncorrected ml/min/mmHg 19.95  DLCO UNC% % 82  DLVA Predicted % 108  TLC L 4.03  TLC % Predicted % 79  RV % Predicted % 78    WALK:  No flowsheet data found.  Imaging: No results found.  Lab  Results:  CBC    Component Value Date/Time   WBC 9.4 07/01/2020 0206   RBC 4.03 07/01/2020 0206   HGB 11.7 (L) 07/01/2020 0206   HCT 36.7 07/01/2020 0206   PLT 332 07/01/2020 0206   MCV 91.1 07/01/2020 0206   MCH 29.0 07/01/2020 0206   MCHC 31.9 07/01/2020 0206   RDW 11.9 07/01/2020 0206   LYMPHSABS 4.5 (H) 06/30/2020 1637   MONOABS 0.6 06/30/2020 1637   EOSABS 0.3 06/30/2020 1637   BASOSABS 0.1 06/30/2020 1637    BMET    Component Value Date/Time   NA 141 07/01/2020 0206   NA 142 08/12/2018 1143   K 3.7 07/01/2020 0206   CL 107 07/01/2020 0206   CO2 23 07/01/2020 0206   GLUCOSE 105 (H) 07/01/2020 0206   BUN 10 07/01/2020 0206   BUN 13 08/12/2018 1143   CREATININE 0.80 07/01/2020 0206   CALCIUM 9.5 07/01/2020 0206   GFRNONAA >60 07/01/2020 0206   GFRAA >60 07/01/2020 0206    BNP    Component Value Date/Time   BNP 7.6 09/27/2014 1541    ProBNP    Component Value Date/Time   PROBNP 43.6 07/02/2014 2212    Specialty Problems      Pulmonary Problems   Acute sinusitis    Qualifier: Diagnosis of  By: Plotnikov MD, Georgina QuintAleksei V  Dyspnea   OSA on CPAP    Mod-AHI 8/h, RDI 2/h , lowest 79% AutoCPAP 03/2014, pillows      Cough    Attributed to postnasal drip and GERD         Allergies  Allergen Reactions  . Detrol [Tolterodine] Other (See Comments)    slurred speech, tremors, dizziness  . Gabapentin Palpitations and Other (See Comments)    Wt gain, tremor  . Aspirin Other (See Comments)    Avoids--- history of gastric ulcers   . Contrast Media [Iodinated Diagnostic Agents] Hives, Itching and Rash    CT contrast-Needed to take benadryl   . Hydrocodone-Acetaminophen Nausea And Vomiting  . Imitrex [Sumatriptan] Other (See Comments)    Body hurts  . Oxycodone Nausea And Vomiting  . Avelox [Moxifloxacin] Itching    Immunization History  Administered Date(s) Administered  . Influenza,inj,Quad PF,6+ Mos 09/20/2014  . PFIZER(Purple  Top)SARS-COV-2 Vaccination 02/16/2020, 03/09/2020, 09/12/2020  . Tdap 12/30/2011    Past Medical History:  Diagnosis Date  . Anxiety   . Chronic lower back pain   . Conversion disorder   . Depression   . Diverticulitis   . Expressive aphasia 11/15/2016  . Fibromyalgia   . GERD (gastroesophageal reflux disease)   . High cholesterol   . History of gastric ulcer   . History of hiatal hernia   . History of kidney stones   . History of thyroid nodule   . Hyperlipidemia   . Hypertension   . IBS (irritable bowel syndrome)   . Intermittent vertigo   . Migraine    "a few times/year now; maybe" (11/15/2016)  . Obesity   . OSA on CPAP    wears CPAP nightly ;study in epic 02-01-2014 (11/15/2016)  . Osteoarthritis    "knees, hands, back, hips" (11/15/2016)  . Pneumonia X 1   hx of  . Psychogenic tremor    intermittant head tremor-(11/15/2016)  . Refusal of blood transfusions as patient is Jehovah's Witness   . RLS (restless legs syndrome)   . Sleep apnea    CPAP  . Urinary incontinence   . Vertigo     Tobacco History: Social History   Tobacco Use  Smoking Status Former Smoker  . Packs/day: 0.50  . Years: 25.00  . Pack years: 12.50  . Types: Cigarettes  . Quit date: 07/24/2008  . Years since quitting: 12.4  Smokeless Tobacco Never Used   Counseling given: Not Answered   Continue to not smoke  Outpatient Encounter Medications as of 01/04/2021  Medication Sig  . acetaminophen (TYLENOL) 500 MG tablet Take 1,000 mg by mouth every 6 (six) hours as needed for headache (pain).  . benzonatate (TESSALON) 200 MG capsule Take 1 capsule (200 mg total) by mouth 3 (three) times daily as needed for cough.  . Cholecalciferol (VITAMIN D3 PO) Take 1 tablet by mouth at bedtime.  . clotrimazole-betamethasone (LOTRISONE) cream APPLY TO AFFECTED AREA TWICE A DAY  . Cyanocobalamin (VITAMIN B 12 PO) 1 capsule with food or milk5049mcg  . cyclobenzaprine (FLEXERIL) 5 MG tablet Take 1 tablet  (5 mg total) by mouth 2 (two) times daily as needed for muscle spasms.  [name] escitalopram (LEXAPRO) 10 MG tablet Take 10 mg by mouth at bedtime.   . famotidine (PEPCID) 20 MG tablet Take 1 tablet (20 mg total) by mouth at bedtime.  . pantoprazole (PROTONIX) 40 MG tablet Take 40 mg by mouth at bedtime.   . rosuvastatin (CRESTOR) 10 MG tablet Take 10  mg by mouth daily.  . vitamin E 1000 UNIT capsule Take 1,000 Units by mouth at bedtime.  . [DISCONTINUED] diclofenac Sodium (VOLTAREN) 1 % GEL Apply 1 application topically 2 (two) times daily as needed (pain).  . [DISCONTINUED] dicyclomine (BENTYL) 10 MG capsule Take 10 mg by mouth See admin instructions. Take one tablet (10 mg) by mouth daily at bedtime, may also take one tablet (10 mg) in the morning as needed for cramping/spasms  . [DISCONTINUED] fluocinonide (LIDEX) 0.05 % external solution Apply 1 application topically daily.    No facility-administered encounter medications on file as of 01/04/2021.     Review of Systems  Review of Systems  Constitutional: Negative for activity change, fatigue and fever.  HENT: Positive for trouble swallowing. Negative for congestion, sinus pressure, sinus pain and sore throat.   Respiratory: Positive for cough, choking and wheezing. Negative for shortness of breath.   Cardiovascular: Negative for chest pain and palpitations.  Gastrointestinal: Negative for diarrhea, nausea and vomiting.  Musculoskeletal: Negative for arthralgias.  Neurological: Negative for dizziness.  Psychiatric/Behavioral: Negative for sleep disturbance. The patient is not nervous/anxious.      Physical Exam  BP 120/80 (BP Location: Left Wrist, Cuff Size: Normal)   Pulse 68   Temp (!) 97 F (36.1 C) (Other (Comment)) Comment (Src): wrist  Ht 5' 4.5" (1.638 m)   Wt 258 lb 6.4 oz (117.2 kg)   SpO2 98% Comment: RA  BMI 43.67 kg/m   Wt Readings from Last 5 Encounters:  01/04/21 258 lb 6.4 oz (117.2 kg)  11/08/20 258 lb 8 oz  (117.3 kg)  06/30/20 (!) 255 lb 15.3 oz (116.1 kg)  06/02/19 256 lb (116.1 kg)  11/10/18 259 lb (117.5 kg)    BMI Readings from Last 5 Encounters:  01/04/21 43.67 kg/m  11/08/20 43.02 kg/m  06/30/20 42.59 kg/m  06/02/19 42.60 kg/m  11/10/18 44.46 kg/m     Physical Exam Constitutional:      General: She is not in acute distress.    Appearance: She is obese. She is not ill-appearing.  HENT:     Right Ear: External ear normal.     Left Ear: External ear normal.     Nose: Congestion present. No rhinorrhea.     Mouth/Throat:     Mouth: Mucous membranes are moist.     Pharynx: Oropharynx is clear.     Comments: MP 3 Cardiovascular:     Rate and Rhythm: Normal rate and regular rhythm.     Pulses: Normal pulses.     Heart sounds: Normal heart sounds.  Pulmonary:     Effort: Pulmonary effort is normal. No respiratory distress.     Breath sounds: No stridor. Wheezing (expiratory ) present. No rhonchi.  Skin:    General: Skin is warm and dry.  Neurological:     General: No focal deficit present.     Mental Status: She is alert.  Psychiatric:        Mood and Affect: Mood normal.       Assessment & Plan:   OSA on CPAP Reports good Adherence. AHI 0.7. CPAP with error message.   Plan: Encouraged lifestyle modifications; diet and exercise   Order placed for new CPAP machine  Continue current CPAP settings   GERD GERD well controlled on Protonix  which she takes at night. Experiencing increase in coughing and choking sensation with meals.   Plan:  Start taking Protonxi in morning at least 30 minutes prior to meal.  Begin Pepcid 20mg  QHS Amb. Referral to GI   Cough Post Covid 11/2020, chronic cough flare afterwards. Cough worsens with deep inspiration and eating.  Started Delsym OTC cough syrup on Tuesday. Continues to use Protonix, takes in the AM.   Plan:  Begin Tessalon Perles  Continue OTC cough syrup Continue Protonix but start taking in morning Begin  Pepcid QHS  Amb. Referral to GI   History of COVID-19 Covid + in 11/2020 was not hospitalized.  Vaccinated with all three vaccines. Chronic cough flare after Covid.   Plan:  CXRY today in office     Return in about 3 months (around 04/03/2021), or if symptoms worsen or fail to improve, for Follow up with Dr. Elsworth Soho.   Lauraine Rinne, NP 01/04/2021   This appointment required 32  minutes of patient care (this includes precharting, chart review, review of results, face-to-face care, etc.).

## 2021-01-04 NOTE — Assessment & Plan Note (Addendum)
Post Covid 11/2020, chronic cough flare afterwards. Cough worsens with deep inspiration and eating.  Started Delsym OTC cough syrup on Tuesday. Continues to use Protonix, takes in the AM.   Plan:  Begin Tessalon Perles  Continue OTC cough syrup Continue Protonix but start taking in morning Begin Pepcid QHS  Amb. Referral to GI

## 2021-01-04 NOTE — Assessment & Plan Note (Addendum)
Reports good Adherence. AHI 0.7. CPAP with error message.   Plan: Encouraged lifestyle modifications; diet and exercise   Order placed for new CPAP machine  Continue current CPAP settings

## 2021-01-04 NOTE — Assessment & Plan Note (Addendum)
GERD well controlled on Protonix  which she takes at night. Experiencing increase in coughing and choking sensation with meals.   Plan:  Start taking Protonxi in morning at least 30 minutes prior to meal.  Begin Pepcid 20mg  QHS Amb. Referral to GI

## 2021-01-04 NOTE — Progress Notes (Deleted)
@Patient  ID: Paula [name], female    DOB: Jun 06, 1961, 60 y.o.   MRN: [id number]  No chief complaint on file.   Referring provider: Carol Ada, MD  HPI:  60 year old female former smoker [facility] for mild obstructive sleep apnea chronic cough  PMH: Dyslipidemia, obesity, anxiety, ADD, hypertension, TMJ, GERD, fatigue, atypical chest pain, depression, chronic pain syndrome Smoker/ Smoking History: Former smoker.  Quit 2009.  12.5-pack-year smoking history Maintenance: None Pt of: Dr. Elsworth Soho  01/04/2021  - Visit   Faint wheeze  Cough post covid  Post covid cough     Questionaires / Pulmonary Flowsheets:   ACT:  No flowsheet data found.  MMRC: No flowsheet data found.  Epworth:  No flowsheet data found.  Tests:   PSG 2002 >> CPAP 9cm with a nasal mask PSG 01/2014 - AHI 8/h, RDI 2/h , lowest 79% Placed on autoCPAP with pillows ([facility]) >>avg pr 11 cm    PFTs 11/2015 FEV1 at 99%, ratio 92, no significant bronchodilator response, FVC 85%. DLCO 82%. Spirometry 04/2018  moderate restriction with FEV1 at 71%, ratio 89, FVC 63%.  UGI series 10/2015 >>Esophageal dysmotility with distal barium stasis, likely secondary to reflux. Reflux to the lower esophagus was documented  CT chest 04/2018 showed clear lungs negative for PE. FENO 9 ppb .   FENO:  Lab Results  Component Value Date   NITRICOXIDE 9 04/24/2018    PFT: PFT Results Latest Ref Rng & Units 11/08/2015  FVC-Pre L 2.44  FVC-Predicted Pre % 88  FVC-Post L 2.37  FVC-Predicted Post % 85  Pre FEV1/FVC % % 91  Post FEV1/FCV % % 92  FEV1-Pre L 2.22  FEV1-Predicted Pre % 101  FEV1-Post L 2.18  DLCO uncorrected ml/min/mmHg 19.95  DLCO UNC% % 82  DLVA Predicted % 108  TLC L 4.03  TLC % Predicted % 79  RV % Predicted % 78    WALK:  No flowsheet data found.  Imaging: No results found.  Lab Results:  CBC    Component Value Date/Time   WBC 9.4 07/01/2020 0206   RBC 4.03  07/01/2020 0206   HGB 11.7 (L) 07/01/2020 0206   HCT 36.7 07/01/2020 0206   PLT 332 07/01/2020 0206   MCV 91.1 07/01/2020 0206   MCH 29.0 07/01/2020 0206   MCHC 31.9 07/01/2020 0206   RDW 11.9 07/01/2020 0206   LYMPHSABS 4.5 (H) 06/30/2020 1637   MONOABS 0.6 06/30/2020 1637   EOSABS 0.3 06/30/2020 1637   BASOSABS 0.1 06/30/2020 1637    BMET    Component Value Date/Time   NA 141 07/01/2020 0206   NA 142 08/12/2018 1143   K 3.7 07/01/2020 0206   CL 107 07/01/2020 0206   CO2 23 07/01/2020 0206   GLUCOSE 105 (H) 07/01/2020 0206   BUN 10 07/01/2020 0206   BUN 13 08/12/2018 1143   CREATININE 0.80 07/01/2020 0206   CALCIUM 9.5 07/01/2020 0206   GFRNONAA >60 07/01/2020 0206   GFRAA >60 07/01/2020 0206    BNP    Component Value Date/Time   BNP 7.6 09/27/2014 1541    ProBNP    Component Value Date/Time   PROBNP 43.6 07/02/2014 2212    Specialty Problems      Pulmonary Problems   Acute sinusitis    Qualifier: Diagnosis of  By: Plotnikov MD, Evie Lacks       Dyspnea   OSA on CPAP    Mod-AHI 8/h, RDI 2/h , lowest 79% AutoCPAP  03/2014, pillows      Cough    Attributed to postnasal drip and GERD         Allergies  Allergen Reactions  . Detrol [Tolterodine] Other (See Comments)    slurred speech, tremors, dizziness  . Gabapentin Palpitations and Other (See Comments)    Wt gain, tremor  . Aspirin Other (See Comments)    Avoids--- history of gastric ulcers   . Contrast Media [Iodinated Diagnostic Agents] Hives, Itching and Rash    CT contrast-Needed to take benadryl   . Hydrocodone-Acetaminophen Nausea And Vomiting  . Imitrex [Sumatriptan] Other (See Comments)    Body hurts  . Oxycodone Nausea And Vomiting  . Avelox [Moxifloxacin] Itching    Immunization History  Administered Date(s) Administered  . Influenza,inj,Quad PF,6+ Mos 09/20/2014  . PFIZER(Purple Top)SARS-COV-2 Vaccination 09/12/2020  . Tdap 12/30/2011    Past Medical History:  Diagnosis  Date  . Anxiety   . Chronic lower back pain   . Conversion disorder   . Depression   . Diverticulitis   . Expressive aphasia 11/15/2016  . Fibromyalgia   . GERD (gastroesophageal reflux disease)   . High cholesterol   . History of gastric ulcer   . History of hiatal hernia   . History of kidney stones   . History of thyroid nodule   . Hyperlipidemia   . Hypertension   . IBS (irritable bowel syndrome)   . Intermittent vertigo   . Migraine    "a few times/year now; maybe" (11/15/2016)  . Obesity   . OSA on CPAP    wears CPAP nightly ;study in epic 02-01-2014 (11/15/2016)  . Osteoarthritis    "knees, hands, back, hips" (11/15/2016)  . Pneumonia X 1   hx of  . Psychogenic tremor    intermittant head tremor-(11/15/2016)  . Refusal of blood transfusions as patient is Jehovah's Witness   . RLS (restless legs syndrome)   . Sleep apnea    CPAP  . Urinary incontinence   . Vertigo     Tobacco History: Social History   Tobacco Use  Smoking Status Former Smoker  . Packs/day: 0.50  . Years: 25.00  . Pack years: 12.50  . Types: Cigarettes  . Quit date: 07/24/2008  . Years since quitting: 12.4  Smokeless Tobacco Never Used   Counseling given: Not Answered   Continue to not smoke  Outpatient Encounter Medications as of 01/04/2021  Medication Sig  . acetaminophen (TYLENOL) 500 MG tablet Take 1,000 mg by mouth every 6 (six) hours as needed for headache (pain).  . Cholecalciferol (VITAMIN D3 PO) Take 1 tablet by mouth at bedtime.  . clotrimazole-betamethasone (LOTRISONE) cream APPLY TO AFFECTED AREA TWICE A DAY  . cyclobenzaprine (FLEXERIL) 5 MG tablet Take 1 tablet (5 mg total) by mouth 2 (two) times daily as needed for muscle spasms.  . diclofenac Sodium (VOLTAREN) 1 % GEL Apply 1 application topically 2 (two) times daily as needed (pain).  [name] dicyclomine (BENTYL) 10 MG capsule Take 10 mg by mouth See admin instructions. Take one tablet (10 mg) by mouth daily at bedtime, may  also take one tablet (10 mg) in the morning as needed for cramping/spasms  . escitalopram (LEXAPRO) 10 MG tablet Take 10 mg by mouth at bedtime.   . fluocinonide (LIDEX) 0.05 % external solution Apply 1 application topically daily.   . pantoprazole (PROTONIX) 40 MG tablet Take 40 mg by mouth at bedtime.   . rosuvastatin (CRESTOR) 10 MG tablet Take 10  mg by mouth daily.  . vitamin E 1000 UNIT capsule Take 1,000 Units by mouth at bedtime.   No facility-administered encounter medications on file as of 01/04/2021.     Review of Systems  Review of Systems   Physical Exam  There were no vitals taken for this visit.  Wt Readings from Last 5 Encounters:  11/08/20 258 lb 8 oz (117.3 kg)  06/30/20 (!) 255 lb 15.3 oz (116.1 kg)  06/02/19 256 lb (116.1 kg)  11/10/18 259 lb (117.5 kg)  10/22/18 250 lb (113.4 kg)    BMI Readings from Last 5 Encounters:  11/08/20 43.02 kg/m  06/30/20 42.59 kg/m  06/02/19 42.60 kg/m  11/10/18 44.46 kg/m  10/22/18 42.91 kg/m     Physical Exam    Assessment & Plan:   No problem-specific Assessment & Plan notes found for this encounter.    No follow-ups on file.   Lauraine Rinne, NP 01/04/2021   This appointment required *** minutes of patient care (this includes precharting, chart review, review of results, face-to-face care, etc.).

## 2021-01-04 NOTE — Assessment & Plan Note (Signed)
Covid + in 11/2020 was not hospitalized.  Vaccinated with all three vaccines. Chronic cough flare after Covid.   Plan:  CXRY today in office

## 2021-01-11 ENCOUNTER — Telehealth: Payer: Self-pay | Admitting: Pulmonary Disease

## 2021-01-11 DIAGNOSIS — G4730 Sleep apnea, unspecified: Secondary | ICD-10-CM

## 2021-01-11 DIAGNOSIS — R519 Headache, unspecified: Secondary | ICD-10-CM | POA: Diagnosis not present

## 2021-01-11 DIAGNOSIS — M5030 Other cervical disc degeneration, unspecified cervical region: Secondary | ICD-10-CM | POA: Diagnosis not present

## 2021-01-11 DIAGNOSIS — J4520 Mild intermittent asthma, uncomplicated: Secondary | ICD-10-CM | POA: Diagnosis not present

## 2021-01-11 DIAGNOSIS — R059 Cough, unspecified: Secondary | ICD-10-CM | POA: Diagnosis not present

## 2021-01-11 NOTE — Telephone Encounter (Signed)
Dr. Elsworth Soho please advise if ordering a LUNA CPAP machine for the patient is okay

## 2021-01-12 DIAGNOSIS — M5030 Other cervical disc degeneration, unspecified cervical region: Secondary | ICD-10-CM | POA: Diagnosis not present

## 2021-01-12 DIAGNOSIS — R519 Headache, unspecified: Secondary | ICD-10-CM | POA: Diagnosis not present

## 2021-01-12 DIAGNOSIS — J4520 Mild intermittent asthma, uncomplicated: Secondary | ICD-10-CM | POA: Diagnosis not present

## 2021-01-12 DIAGNOSIS — R059 Cough, unspecified: Secondary | ICD-10-CM | POA: Diagnosis not present

## 2021-01-12 NOTE — Telephone Encounter (Signed)
Okay to order?

## 2021-01-12 NOTE — Telephone Encounter (Signed)
Call returned to patient, confirmed DOB. Made aware order for Luna Cpap machine has been placed. Voiced understanding.   Nothing further needed at this time.

## 2021-01-15 ENCOUNTER — Encounter: Payer: Self-pay | Admitting: *Deleted

## 2021-01-15 ENCOUNTER — Telehealth: Payer: Self-pay | Admitting: Internal Medicine

## 2021-01-15 NOTE — Telephone Encounter (Signed)
Hi Dr. Henrene Pastor,   We received a referral from PCP for patient to be seen for GERD. Patient is currently with [facility] and is requesting to return to [facility]. She is wanting to discuss having an EGD and prefers for you to do it for her. Also mentioned being your patient in the past.    Please advise on scheduling.  Records in care everywhere are available.   Thank you.

## 2021-01-15 NOTE — Telephone Encounter (Signed)
Routine follow up with me or APP (on my behalf) is fine. Please get the outside records from [facility]. Thanks

## 2021-01-16 ENCOUNTER — Encounter: Payer: Self-pay | Admitting: Internal Medicine

## 2021-01-16 NOTE — Telephone Encounter (Signed)
Called patient to schedule left voicemail. 

## 2021-01-29 DIAGNOSIS — Z6841 Body Mass Index (BMI) 40.0 and over, adult: Secondary | ICD-10-CM | POA: Diagnosis not present

## 2021-01-29 DIAGNOSIS — R635 Abnormal weight gain: Secondary | ICD-10-CM | POA: Diagnosis not present

## 2021-01-29 DIAGNOSIS — R7989 Other specified abnormal findings of blood chemistry: Secondary | ICD-10-CM | POA: Diagnosis not present

## 2021-01-29 DIAGNOSIS — E782 Mixed hyperlipidemia: Secondary | ICD-10-CM | POA: Diagnosis not present

## 2021-01-29 DIAGNOSIS — N951 Menopausal and female climacteric states: Secondary | ICD-10-CM | POA: Diagnosis not present

## 2021-01-31 DIAGNOSIS — R635 Abnormal weight gain: Secondary | ICD-10-CM | POA: Diagnosis not present

## 2021-01-31 DIAGNOSIS — Z1339 Encounter for screening examination for other mental health and behavioral disorders: Secondary | ICD-10-CM | POA: Diagnosis not present

## 2021-01-31 DIAGNOSIS — F331 Major depressive disorder, recurrent, moderate: Secondary | ICD-10-CM | POA: Diagnosis not present

## 2021-01-31 DIAGNOSIS — E782 Mixed hyperlipidemia: Secondary | ICD-10-CM | POA: Diagnosis not present

## 2021-01-31 DIAGNOSIS — N951 Menopausal and female climacteric states: Secondary | ICD-10-CM | POA: Diagnosis not present

## 2021-01-31 DIAGNOSIS — Z1331 Encounter for screening for depression: Secondary | ICD-10-CM | POA: Diagnosis not present

## 2021-01-31 DIAGNOSIS — I10 Essential (primary) hypertension: Secondary | ICD-10-CM | POA: Diagnosis not present

## 2021-01-31 DIAGNOSIS — F419 Anxiety disorder, unspecified: Secondary | ICD-10-CM | POA: Diagnosis not present

## 2021-01-31 DIAGNOSIS — Z6841 Body Mass Index (BMI) 40.0 and over, adult: Secondary | ICD-10-CM | POA: Diagnosis not present

## 2021-02-05 DIAGNOSIS — N3946 Mixed incontinence: Secondary | ICD-10-CM | POA: Diagnosis not present

## 2021-02-05 DIAGNOSIS — N898 Other specified noninflammatory disorders of vagina: Secondary | ICD-10-CM | POA: Diagnosis not present

## 2021-02-07 DIAGNOSIS — Z6841 Body Mass Index (BMI) 40.0 and over, adult: Secondary | ICD-10-CM | POA: Diagnosis not present

## 2021-02-07 DIAGNOSIS — I10 Essential (primary) hypertension: Secondary | ICD-10-CM | POA: Diagnosis not present

## 2021-02-21 DIAGNOSIS — E782 Mixed hyperlipidemia: Secondary | ICD-10-CM | POA: Diagnosis not present

## 2021-02-21 DIAGNOSIS — Z6841 Body Mass Index (BMI) 40.0 and over, adult: Secondary | ICD-10-CM | POA: Diagnosis not present

## 2021-02-28 ENCOUNTER — Ambulatory Visit (INDEPENDENT_AMBULATORY_CARE_PROVIDER_SITE_OTHER): Payer: Medicare HMO | Admitting: Internal Medicine

## 2021-02-28 ENCOUNTER — Telehealth: Payer: Self-pay | Admitting: Pulmonary Disease

## 2021-02-28 VITALS — BP 126/70 | HR 76 | Ht 64.5 in | Wt 260.4 lb

## 2021-02-28 DIAGNOSIS — I10 Essential (primary) hypertension: Secondary | ICD-10-CM | POA: Diagnosis not present

## 2021-02-28 DIAGNOSIS — Z6841 Body Mass Index (BMI) 40.0 and over, adult: Secondary | ICD-10-CM | POA: Diagnosis not present

## 2021-02-28 DIAGNOSIS — E782 Mixed hyperlipidemia: Secondary | ICD-10-CM | POA: Diagnosis not present

## 2021-02-28 DIAGNOSIS — Z1211 Encounter for screening for malignant neoplasm of colon: Secondary | ICD-10-CM

## 2021-02-28 DIAGNOSIS — K219 Gastro-esophageal reflux disease without esophagitis: Secondary | ICD-10-CM

## 2021-02-28 MED ORDER — PLENVU 140 G PO SOLR: 1.0000 | Freq: Once | ORAL | 0 refills | Status: AC

## 2021-02-28 NOTE — Telephone Encounter (Signed)
Spoke with [facility] and notified them that Aaron Edelman did not order nor does he managed stated medication. Pharmacy tech with [facility] stated pt's chart would be noted. Nothing further needed at this time.

## 2021-02-28 NOTE — Patient Instructions (Signed)
If you are age 61 or older, your body mass index should be between 23-30. Your Body mass index is 44.01 kg/m. If this is out of the aforementioned range listed, please consider follow up with your Primary Care Provider.  If you are age 30 or younger, your body mass index should be between 19-25. Your Body mass index is 44.01 kg/m. If this is out of the aformentioned range listed, please consider follow up with your Primary Care Provider.   You have been scheduled for an endoscopy and colonoscopy. Please follow the written instructions given to you at your visit today. Please pick up your prep supplies at the pharmacy within the next 1-3 days. If you use inhalers (even only as needed), please bring them with you on the day of your procedure.

## 2021-03-01 ENCOUNTER — Encounter: Payer: Self-pay | Admitting: Internal Medicine

## 2021-03-01 NOTE — Progress Notes (Signed)
HISTORY OF PRESENT ILLNESS:  Paula [name] is a 60 y.o. female with multiple medical problems as listed below who requests reestablishing her GI care with this office.  I saw the patient previously for screening colonoscopy as part of GI research for IBS/diarrhea June 26, 2011.  The examination revealed moderate diverticulosis throughout the colon but was otherwise normal.  She is due for follow-up routine screening, and request such.  She also has a history of GERD.  Upper endoscopy performed Apr 08, 2012, with myself, was normal except for a sliding hiatal hernia.  In addition to GERD and IBS she is felt to have chronic functional dyspepsia.  She transferred her care to [facility] gastroenterology (Dr. Amedeo Plenty, then Dr. Alessandra Bevels).  She tells me that she had upper endoscopy greater than 5 years ago and sustained an injury to her lip.  In addition to requesting screening colonoscopy, the patient complains of her progressive weight gain.  She also describes postprandial fullness.  She has regurgitation as well as coughing and choking spells which she feels may represent inadequately treated reflux.  She is currently on pantoprazole 40 mg at bedtime and famotidine 20 mg at bedtime.  Her current BMI is 44.  I have reviewed some of her outside records.  Diagnoses include abdominal bloating, chronic GERD, IBS, and anal itching.  Outside work-ups including CBC, CMP, lipase, and imaging such as CT scan revealed no abnormalities.  She does have fatty liver.  GI pathogen panel previously checked was negative.  She did not have any improvement with nortriptyline.  She is status post cholecystectomy.  She was apparently treated with Questran.  Review of blood work from July 2021 shows normal comprehensive metabolic panel.  Normal CBC with hemoglobin 12.2.  Last CT scan of the abdomen and pelvis with contrast December 2019.  Fatty liver only.  Ultrasound August 2019 fatty liver.  Status post cholecystectomy.  REVIEW OF  SYSTEMS:  All non-GI ROS negative unless otherwise stated in the HPI except for anxiety, arthritis, back pain, visual change, cough, depression, fatigue, night sweats, shortness of breath, sleeping problems, excessive thirst, excessive urination, urinary leakage, voice change  Past Medical History:  Diagnosis Date  . Anxiety   . Chronic lower back pain   . Conversion disorder   . Depression   . Diverticulitis   . Expressive aphasia 11/15/2016  . Fibromyalgia   . GERD (gastroesophageal reflux disease)   . High cholesterol   . History of gastric ulcer   . History of hiatal hernia   . History of kidney stones   . History of thyroid nodule   . Hyperlipidemia   . Hypertension   . IBS (irritable bowel syndrome)   . Intermittent vertigo   . Migraine    "a few times/year now; maybe" (11/15/2016)  . Obesity   . OSA on CPAP    wears CPAP nightly ;study in epic 02-01-2014 (11/15/2016)  . Osteoarthritis    "knees, hands, back, hips" (11/15/2016)  . Pneumonia X 1   hx of  . Psychogenic tremor    intermittant head tremor-(11/15/2016)  . Refusal of blood transfusions as patient is Jehovah's Witness   . RLS (restless legs syndrome)   . Sleep apnea    CPAP  . Urinary incontinence   . Vertigo     Past Surgical History:  Procedure Laterality Date  . ABDOMINAL HYSTERECTOMY  1990  . ANTERIOR CERVICAL DECOMP/DISCECTOMY FUSION  02-10-2004   C4 -- C7  . BACK SURGERY    .  CARDIOVASCULAR STRESS TEST  2017   Negative  . CESAREAN SECTION  1977; 1982  . COLONOSCOPY    . CYSTOSCOPY W/ RETROGRADES Right 06/07/2015   Procedure: CYSTOSCOPY WITH RETROGRADE PYELOGRAM;  Surgeon: Ardis Hughs, MD;  Location: [facility];  Service: Urology;  Laterality: Right;  . CYSTOSCOPY WITH URETEROSCOPY AND STENT PLACEMENT Right 06/07/2015   Procedure: RIGHT URETEROSCOPY, LASER LITHOTRIPSY, STONE REMOVAL AND RIGHT URETERAL STENT PLACEMENT;  Surgeon: Ardis Hughs, MD;  Location: [facility];  Service: Urology;  Laterality: Right;  . HERNIA REPAIR    . HOLMIUM LASER APPLICATION N/A 04/02/801   Procedure: HOLMIUM LASER APPLICATION;  Surgeon: Ardis Hughs, MD;  Location: [facility];  Service: Urology;  Laterality: N/A;  . LAPAROSCOPIC CHOLECYSTECTOMY  10-07-2000  . LIPOMA EXCISION  01/2017   from neck  . UMBILICAL HERNIA REPAIR  age 19  . UPPER GI ENDOSCOPY      Social History Paula [name]  reports that she quit smoking about 12 years ago. Her smoking use included cigarettes. She has a 12.50 pack-year smoking history. She has never used smokeless tobacco. She reports current alcohol use. She reports that she does not use drugs.  family history includes Atrial fibrillation in her father; CVA in her maternal grandmother; Colon cancer in her father, maternal grandfather, and paternal grandfather; Heart disease in her father; Hyperlipidemia in her mother; Hypertension in her brother, father, maternal grandfather, maternal grandmother, mother, and sister; Lung cancer in her maternal aunt and maternal grandfather; Osteoporosis in her mother; Sleep apnea in her brother, mother, and sister; Syncope episode in her father; Throat cancer in her maternal grandfather.  Allergies  Allergen Reactions  . Detrol [Tolterodine] Other (See Comments)    slurred speech, tremors, dizziness  . Gabapentin Palpitations and Other (See Comments)    Wt gain, tremor  . Aspirin Other (See Comments)    Avoids--- history of gastric ulcers   . Contrast Media [Iodinated Diagnostic Agents] Hives, Itching and Rash    CT contrast-Needed to take benadryl   . Hydrocodone-Acetaminophen Nausea And Vomiting  . Imitrex [Sumatriptan] Other (See Comments)    Body hurts  . Oxycodone Nausea And Vomiting  . Avelox [Moxifloxacin] Itching       PHYSICAL EXAMINATION: Vital signs: BP 126/70   Pulse 76   Ht 5' 4.5" (1.638 m)   Wt 260 lb 6.4 oz (118.1 kg)   BMI 44.01 kg/m    Constitutional: Pleasant, obese but generally well-appearing, no acute distress Psychiatric: alert and oriented x3, cooperative Eyes: extraocular movements intact, anicteric, conjunctiva pink Mouth: oral pharynx moist, no lesions Neck: supple no lymphadenopathy Cardiovascular: heart regular rate and rhythm, no murmur Lungs: clear to auscultation bilaterally Abdomen: soft, nontender, nondistended, no obvious ascites, no peritoneal signs, normal bowel sounds, no organomegaly (though difficult to assess with body habitus) Rectal: Deferred till colonoscopy Extremities: no clubbing, cyanosis, or lower extremity edema bilaterally Skin: no lesions on visible extremities Neuro: No focal deficits. No asterixis.    ASSESSMENT:  1.  GERD.  May be experiencing breakthrough symptoms 2.  History of IBS/diarrhea 3.  Colon cancer screening.  Last colonoscopy 10 years ago negative for neoplasia 4.  Morbid obesity 5.  Functional dyspepsia 6.  Fatty liver 7.  General medical problems   PLAN:  1.  Reflux precautions with attention to weight loss 2.  Increase pantoprazole to 40 mg twice daily 3.  Schedule colonoscopy for the purposes of colorectal neoplasia  screening.  Patient is high risk due to her body habitus.The nature of the procedure, as well as the risks, benefits, and alternatives were carefully and thoroughly reviewed with the patient. Ample time for discussion and questions allowed. The patient understood, was satisfied, and agreed to proceed. 4.  Schedule upper endoscopy to evaluate refractory reflux and dyspeptic symptoms.  The patient is high risk as above.The nature of the procedure, as well as the risks, benefits, and alternatives were carefully and thoroughly reviewed with the patient. Ample time for discussion and questions allowed. The patient understood, was satisfied, and agreed to proceed. 5.  Further recommendations after the above A total time of 60 minutes was spent preparing  to see the patient, reviewing outside records, laboratory tests, x-rays, and endoscopy reports.  Obtaining comprehensive history and performing comprehensive physical examination.  Counseling the patient regarding the above listed issues.  Ordering medications and advanced endoscopic procedures.  Finally, documenting clinical information in the health record

## 2021-03-12 ENCOUNTER — Other Ambulatory Visit: Payer: Self-pay | Admitting: Internal Medicine

## 2021-03-12 ENCOUNTER — Telehealth: Payer: Self-pay | Admitting: Internal Medicine

## 2021-03-12 MED ORDER — PLENVU 140 G PO SOLR: 1.0000 | Freq: Once | ORAL | 0 refills | Status: AC

## 2021-03-12 NOTE — Telephone Encounter (Signed)
Sent Plenvu to [facility] on [address]

## 2021-03-12 NOTE — Telephone Encounter (Signed)
Patient called in about her prep for PLENVU. It was sent back from her insurance company. She would like the prescription to go to [facility] in [place].

## 2021-03-13 ENCOUNTER — Telehealth: Payer: Self-pay | Admitting: Internal Medicine

## 2021-03-14 NOTE — Telephone Encounter (Signed)
Spoke with patient and told her that I would leave a Plenvu sample up front for her to pick up.  Patient agreed.

## 2021-03-19 DIAGNOSIS — R059 Cough, unspecified: Secondary | ICD-10-CM | POA: Diagnosis not present

## 2021-03-19 DIAGNOSIS — R509 Fever, unspecified: Secondary | ICD-10-CM | POA: Diagnosis not present

## 2021-03-20 DIAGNOSIS — Z03818 Encounter for observation for suspected exposure to other biological agents ruled out: Secondary | ICD-10-CM | POA: Diagnosis not present

## 2021-03-20 DIAGNOSIS — R509 Fever, unspecified: Secondary | ICD-10-CM | POA: Diagnosis not present

## 2021-03-28 DIAGNOSIS — M545 Low back pain, unspecified: Secondary | ICD-10-CM | POA: Diagnosis not present

## 2021-04-03 DIAGNOSIS — J40 Bronchitis, not specified as acute or chronic: Secondary | ICD-10-CM | POA: Diagnosis not present

## 2021-04-23 DIAGNOSIS — J4520 Mild intermittent asthma, uncomplicated: Secondary | ICD-10-CM | POA: Diagnosis not present

## 2021-04-23 DIAGNOSIS — F411 Generalized anxiety disorder: Secondary | ICD-10-CM | POA: Diagnosis not present

## 2021-04-23 DIAGNOSIS — R0602 Shortness of breath: Secondary | ICD-10-CM | POA: Diagnosis not present

## 2021-04-23 DIAGNOSIS — K219 Gastro-esophageal reflux disease without esophagitis: Secondary | ICD-10-CM | POA: Diagnosis not present

## 2021-04-23 DIAGNOSIS — E782 Mixed hyperlipidemia: Secondary | ICD-10-CM | POA: Diagnosis not present

## 2021-04-23 DIAGNOSIS — I10 Essential (primary) hypertension: Secondary | ICD-10-CM | POA: Diagnosis not present

## 2021-04-23 DIAGNOSIS — R7303 Prediabetes: Secondary | ICD-10-CM | POA: Diagnosis not present

## 2021-04-24 ENCOUNTER — Ambulatory Visit
Admission: RE | Admit: 2021-04-24 | Discharge: 2021-04-24 | Disposition: A | Discharge status: Home / Self Care | Payer: Medicare HMO | Source: Ambulatory Visit | Attending: Physician Assistant | Admitting: Physician Assistant

## 2021-04-24 ENCOUNTER — Other Ambulatory Visit: Payer: Self-pay

## 2021-04-24 ENCOUNTER — Other Ambulatory Visit: Payer: Self-pay | Admitting: Physician Assistant

## 2021-04-24 DIAGNOSIS — R0602 Shortness of breath: Secondary | ICD-10-CM

## 2021-04-24 DIAGNOSIS — I10 Essential (primary) hypertension: Secondary | ICD-10-CM | POA: Diagnosis not present

## 2021-04-24 DIAGNOSIS — E782 Mixed hyperlipidemia: Secondary | ICD-10-CM | POA: Diagnosis not present

## 2021-04-24 DIAGNOSIS — R7309 Other abnormal glucose: Secondary | ICD-10-CM | POA: Diagnosis not present

## 2021-05-15 ENCOUNTER — Other Ambulatory Visit: Payer: Self-pay

## 2021-05-15 ENCOUNTER — Encounter: Payer: Self-pay | Admitting: Internal Medicine

## 2021-05-15 ENCOUNTER — Ambulatory Visit (AMBULATORY_SURGERY_CENTER): Payer: Medicare HMO | Admitting: Internal Medicine

## 2021-05-15 VITALS — BP 140/78 | HR 74 | Temp 96.2°F | Resp 11 | Ht 64.5 in | Wt 260.0 lb

## 2021-05-15 DIAGNOSIS — K219 Gastro-esophageal reflux disease without esophagitis: Secondary | ICD-10-CM | POA: Diagnosis not present

## 2021-05-15 DIAGNOSIS — K5730 Diverticulosis of large intestine without perforation or abscess without bleeding: Secondary | ICD-10-CM

## 2021-05-15 DIAGNOSIS — R197 Diarrhea, unspecified: Secondary | ICD-10-CM | POA: Diagnosis not present

## 2021-05-15 DIAGNOSIS — Z1211 Encounter for screening for malignant neoplasm of colon: Secondary | ICD-10-CM

## 2021-05-15 DIAGNOSIS — R1013 Epigastric pain: Secondary | ICD-10-CM | POA: Diagnosis not present

## 2021-05-15 DIAGNOSIS — K648 Other hemorrhoids: Secondary | ICD-10-CM

## 2021-05-15 DIAGNOSIS — K449 Diaphragmatic hernia without obstruction or gangrene: Secondary | ICD-10-CM | POA: Diagnosis not present

## 2021-05-15 MED ORDER — SODIUM CHLORIDE 0.9 % IV SOLN: 500.0000 mL | Freq: Once | INTRAVENOUS | Status: AC

## 2021-05-15 MED ORDER — PANTOPRAZOLE SODIUM 40 MG PO TBEC: 40.0000 mg | DELAYED_RELEASE_TABLET | Freq: Every day | ORAL | 3 refills | Status: DC

## 2021-05-15 NOTE — Op Note (Signed)
[facility] Patient Name: Paula [name] Procedure Date: 05/15/2021 11:34 AM MRN: [id number] Endoscopist: Docia Chuck. Henrene Pastor , MD Age: 60 Referring MD:  Date of Birth: 1961-06-24 Gender: Female Account #: [id number] Procedure:                Colonoscopy Indications:              Screening for colorectal malignant neoplasm,                            Incidental diarrhea noted Medicines:                Monitored Anesthesia Care Procedure:                Pre-Anesthesia Assessment:                           - Prior to the procedure, a History and Physical                            was performed, and patient medications and                            allergies were reviewed. The patient's tolerance of                            previous anesthesia was also reviewed. The risks                            and benefits of the procedure and the sedation                            options and risks were discussed with the patient.                            All questions were answered, and informed consent                            was obtained. Prior Anticoagulants: The patient has                            taken no previous anticoagulant or antiplatelet                            agents. ASA Grade Assessment: II - A patient with                            mild systemic disease. After reviewing the risks                            and benefits, the patient was deemed in                            satisfactory condition to undergo the procedure.  After obtaining informed consent, the colonoscope                            was passed under direct vision. Throughout the                            procedure, the patient's blood pressure, pulse, and                            oxygen saturations were monitored continuously. The                            Olympus CF-HQ190L [id number]) Colonoscope was                            introduced through the anus and advanced to  the the                            cecum, identified by appendiceal orifice and                            ileocecal valve. The ileocecal valve, appendiceal                            orifice, and rectum were photographed. The quality                            of the bowel preparation was excellent. The                            colonoscopy was performed without difficulty. The                            patient tolerated the procedure well. The bowel                            preparation used was SUPREP via split dose                            instruction. Scope In: 11:43:26 AM Scope Out: 11:54:07 AM Scope Withdrawal Time: 0 hours 8 minutes 7 seconds  Total Procedure Duration: 0 hours 10 minutes 41 seconds  Findings:                 Multiple diverticula were found in the entire colon.                           Internal hemorrhoids were found during retroflexion.                           The entire examined colon appeared otherwise normal                            on direct and retroflexion views. Biopsies for  histology were taken with a cold forceps from the                            entire colon for evaluation of microscopic colitis. Complications:            No immediate complications. Estimated blood loss:                            None. Estimated Blood Loss:     Estimated blood loss: none. Impression:               - Diverticulosis in the entire examined colon.                           - Internal hemorrhoids.                           - The entire examined colon is otherwise normal on                            direct and retroflexion views. Recommendation:           - Repeat colonoscopy in 10 years for screening                            purposes.                           - Patient has a contact number available for                            emergencies. The signs and symptoms of potential                            delayed complications were  discussed with the                            patient. Return to normal activities tomorrow.                            Written discharge instructions were provided to the                            patient.                           - Resume previous diet.                           - Continue present medications.                           - Await pathology results. Docia Chuck. Henrene Pastor, MD 05/15/2021 11:57:41 AM This report has been signed electronically.

## 2021-05-15 NOTE — Progress Notes (Signed)
Called to room to assist during endoscopic procedure.  Patient ID and intended procedure confirmed with present staff. Received instructions for my participation in the procedure from the performing physician.  

## 2021-05-15 NOTE — Patient Instructions (Signed)
YOU HAD AN ENDOSCOPIC PROCEDURE TODAY AT THE [facility]:   Refer to the procedure report that was given to you for any specific questions about what was found during the examination.  If the procedure report does not answer your questions, please call your gastroenterologist to clarify.  If you requested that your care partner not be given the details of your procedure findings, then the procedure report has been included in a sealed envelope for you to review at your convenience later.  YOU SHOULD EXPECT: Some feelings of bloating in the abdomen. Passage of more gas than usual.  Walking can help get rid of the air that was put into your GI tract during the procedure and reduce the bloating. If you had a lower endoscopy (such as a colonoscopy or flexible sigmoidoscopy) you may notice spotting of blood in your stool or on the toilet paper. If you underwent a bowel prep for your procedure, you may not have a normal bowel movement for a few days.  Please Note:  You might notice some irritation and congestion in your nose or some drainage.  This is from the oxygen used during your procedure.  There is no need for concern and it should clear up in a day or so.  SYMPTOMS TO REPORT IMMEDIATELY:   Following lower endoscopy (colonoscopy or flexible sigmoidoscopy):  Excessive amounts of blood in the stool  Significant tenderness or worsening of abdominal pains  Swelling of the abdomen that is new, acute  Fever of 100F or higher   Following upper endoscopy (EGD)  Vomiting of blood or coffee ground material  New chest pain or pain under the shoulder blades  Painful or persistently difficult swallowing  New shortness of breath  Fever of 100F or higher  Black, tarry-looking stools  For urgent or emergent issues, a gastroenterologist can be reached at any hour by calling [phone]. Do not use MyChart messaging for urgent concerns.    DIET:  We do recommend a small meal at first, but  then you may proceed to your regular diet.  Drink plenty of fluids but you should avoid alcoholic beverages for 24 hours.  ACTIVITY:  You should plan to take it easy for the rest of today and you should NOT DRIVE or use heavy machinery until tomorrow (because of the sedation medicines used during the test).    FOLLOW UP: Our staff will call the number listed on your records 48-72 hours following your procedure to check on you and address any questions or concerns that you may have regarding the information given to you following your procedure. If we do not reach you, we will leave a message.  We will attempt to reach you two times.  During this call, we will ask if you have developed any symptoms of COVID 19. If you develop any symptoms (ie: fever, flu-like symptoms, shortness of breath, cough etc.) before then, please call [phone].  If you test positive for Covid 19 in the 2 weeks post procedure, please call and report this information to us.    If any biopsies were taken you will be contacted by phone or by letter within the next 1-3 weeks.  Please call us at [phone] if you have not heard about the biopsies in 3 weeks.    SIGNATURES/CONFIDENTIALITY: You and/or your care partner have signed paperwork which will be entered into your electronic medical record.  These signatures attest to the fact that that the information above on   your After Visit Summary has been reviewed and is understood.  Full responsibility of the confidentiality of this discharge information lies with you and/or your care-partner. 

## 2021-05-15 NOTE — Op Note (Signed)
[facility] Patient Name: Paula [name] Procedure Date: 05/15/2021 11:34 AM MRN: [id number] Endoscopist: Docia Chuck. Henrene Pastor , MD Age: 60 Referring MD:  Date of Birth: Jun 02, 1961 Gender: Female Account #: [id number] Procedure:                Upper GI endoscopy Indications:              Epigastric abdominal pain, Esophageal reflux Medicines:                Monitored Anesthesia Care Procedure:                Pre-Anesthesia Assessment:                           - Prior to the procedure, a History and Physical                            was performed, and patient medications and                            allergies were reviewed. The patient's tolerance of                            previous anesthesia was also reviewed. The risks                            and benefits of the procedure and the sedation                            options and risks were discussed with the patient.                            All questions were answered, and informed consent                            was obtained. Prior Anticoagulants: The patient has                            taken no previous anticoagulant or antiplatelet                            agents. ASA Grade Assessment: II - A patient with                            mild systemic disease. After reviewing the risks                            and benefits, the patient was deemed in                            satisfactory condition to undergo the procedure.                           After obtaining informed consent, the endoscope was  passed under direct vision. Throughout the                            procedure, the patient's blood pressure, pulse, and                            oxygen saturations were monitored continuously. The                            Endoscope was introduced through the mouth, and                            advanced to the second part of duodenum. The upper                            GI  endoscopy was accomplished without difficulty.                            The patient tolerated the procedure well. Scope In: Scope Out: Findings:                 The esophagus was normal.                           The stomach was normal, save sliding hiatal hernia.                           The examined duodenum was normal.                           The cardia and gastric fundus were normal on                            retroflexion. Complications:            No immediate complications. Estimated Blood Loss:     Estimated blood loss: none. Impression:               1. GERD                           2. Unremarkable EGD. Recommendation:           - Patient has a contact number available for                            emergencies. The signs and symptoms of potential                            delayed complications were discussed with the                            patient. Return to normal activities tomorrow.                            Written discharge instructions were provided to the  patient.                           - Reflux precautions.                           - Weight reduction                           - Continue present medications. Docia Chuck. Henrene Pastor, MD 05/15/2021 12:06:21 PM This report has been signed electronically.

## 2021-05-15 NOTE — Progress Notes (Signed)
Vs bu CW in Adm

## 2021-05-15 NOTE — Progress Notes (Signed)
PT taken to PACU. Monitors in place. VSS. Report given to RN.   Pt had a laryngospasm near the end of the procedure. A transient drop in SPO2 was noted. I provided oral suctioning and broke the spasm via ambubag. She returned to spontaneous breathing and and was transferred to the recovery room once verbally communicating. MD made aware of situation and details of treatment effectiveness. I have also discussed this with the patient and the recovery nurse in detail.

## 2021-05-17 ENCOUNTER — Telehealth: Payer: Self-pay

## 2021-05-17 NOTE — Telephone Encounter (Signed)
[facility] has a bariatric program and seminars. I recommend that she contact them for more information

## 2021-05-17 NOTE — Telephone Encounter (Signed)
  Follow up Call-  Call back number 05/15/2021  Post procedure Call Back phone  # [phone]  Permission to leave phone message Yes  Some recent data might be hidden    1st follow up call made.  NALM

## 2021-05-17 NOTE — Telephone Encounter (Signed)
LVM

## 2021-05-18 DIAGNOSIS — R252 Cramp and spasm: Secondary | ICD-10-CM | POA: Diagnosis not present

## 2021-05-22 DIAGNOSIS — U071 COVID-19: Secondary | ICD-10-CM | POA: Diagnosis not present

## 2021-05-22 DIAGNOSIS — R6889 Other general symptoms and signs: Secondary | ICD-10-CM | POA: Diagnosis not present

## 2021-05-23 ENCOUNTER — Encounter: Payer: Self-pay | Admitting: Internal Medicine

## 2021-06-07 ENCOUNTER — Encounter: Payer: Self-pay | Admitting: Internal Medicine

## 2021-06-19 DIAGNOSIS — N3946 Mixed incontinence: Secondary | ICD-10-CM | POA: Diagnosis not present

## 2021-07-09 DIAGNOSIS — R030 Elevated blood-pressure reading, without diagnosis of hypertension: Secondary | ICD-10-CM | POA: Diagnosis not present

## 2021-07-09 DIAGNOSIS — M5432 Sciatica, left side: Secondary | ICD-10-CM | POA: Diagnosis not present

## 2021-07-09 DIAGNOSIS — M542 Cervicalgia: Secondary | ICD-10-CM | POA: Diagnosis not present

## 2021-07-09 DIAGNOSIS — K219 Gastro-esophageal reflux disease without esophagitis: Secondary | ICD-10-CM | POA: Diagnosis not present

## 2021-07-17 ENCOUNTER — Telehealth: Payer: Self-pay | Admitting: Internal Medicine

## 2021-07-17 NOTE — Telephone Encounter (Signed)
Inbound call from patient. States she hiatal hernia and wants to discuss if anything could be done about it?  Also, would like a referral Bariatric physician.

## 2021-07-17 NOTE — Telephone Encounter (Signed)
Pt notified of Dr. Henrene Pastor advice on the Hiatal Hernia; Pt was given the number to [facility] to inquire about the bariatric program.

## 2021-07-17 NOTE — Telephone Encounter (Signed)
See Below note and advise

## 2021-07-17 NOTE — Telephone Encounter (Signed)
1.  She does not need intervention on the hiatal hernia 2.  [facility] has a bariatric program.  She should be referred to them.

## 2021-07-23 DIAGNOSIS — M542 Cervicalgia: Secondary | ICD-10-CM | POA: Diagnosis not present

## 2021-07-26 DIAGNOSIS — K219 Gastro-esophageal reflux disease without esophagitis: Secondary | ICD-10-CM | POA: Diagnosis not present

## 2021-07-26 DIAGNOSIS — R1313 Dysphagia, pharyngeal phase: Secondary | ICD-10-CM | POA: Diagnosis not present

## 2021-07-26 DIAGNOSIS — Z8739 Personal history of other diseases of the musculoskeletal system and connective tissue: Secondary | ICD-10-CM | POA: Diagnosis not present

## 2021-07-26 DIAGNOSIS — J343 Hypertrophy of nasal turbinates: Secondary | ICD-10-CM | POA: Diagnosis not present

## 2021-07-31 DIAGNOSIS — M47812 Spondylosis without myelopathy or radiculopathy, cervical region: Secondary | ICD-10-CM | POA: Diagnosis not present

## 2021-07-31 DIAGNOSIS — Z6841 Body Mass Index (BMI) 40.0 and over, adult: Secondary | ICD-10-CM | POA: Diagnosis not present

## 2021-07-31 IMAGING — CR DG CHEST 2V
2 series · 2 of 2 positions shown · non-contrast
Comparison: 01/04/2021

CLINICAL DATA: 59-year-old female with shortness of breath

EXAM:
CHEST - 2 VIEW

[w chest pa]
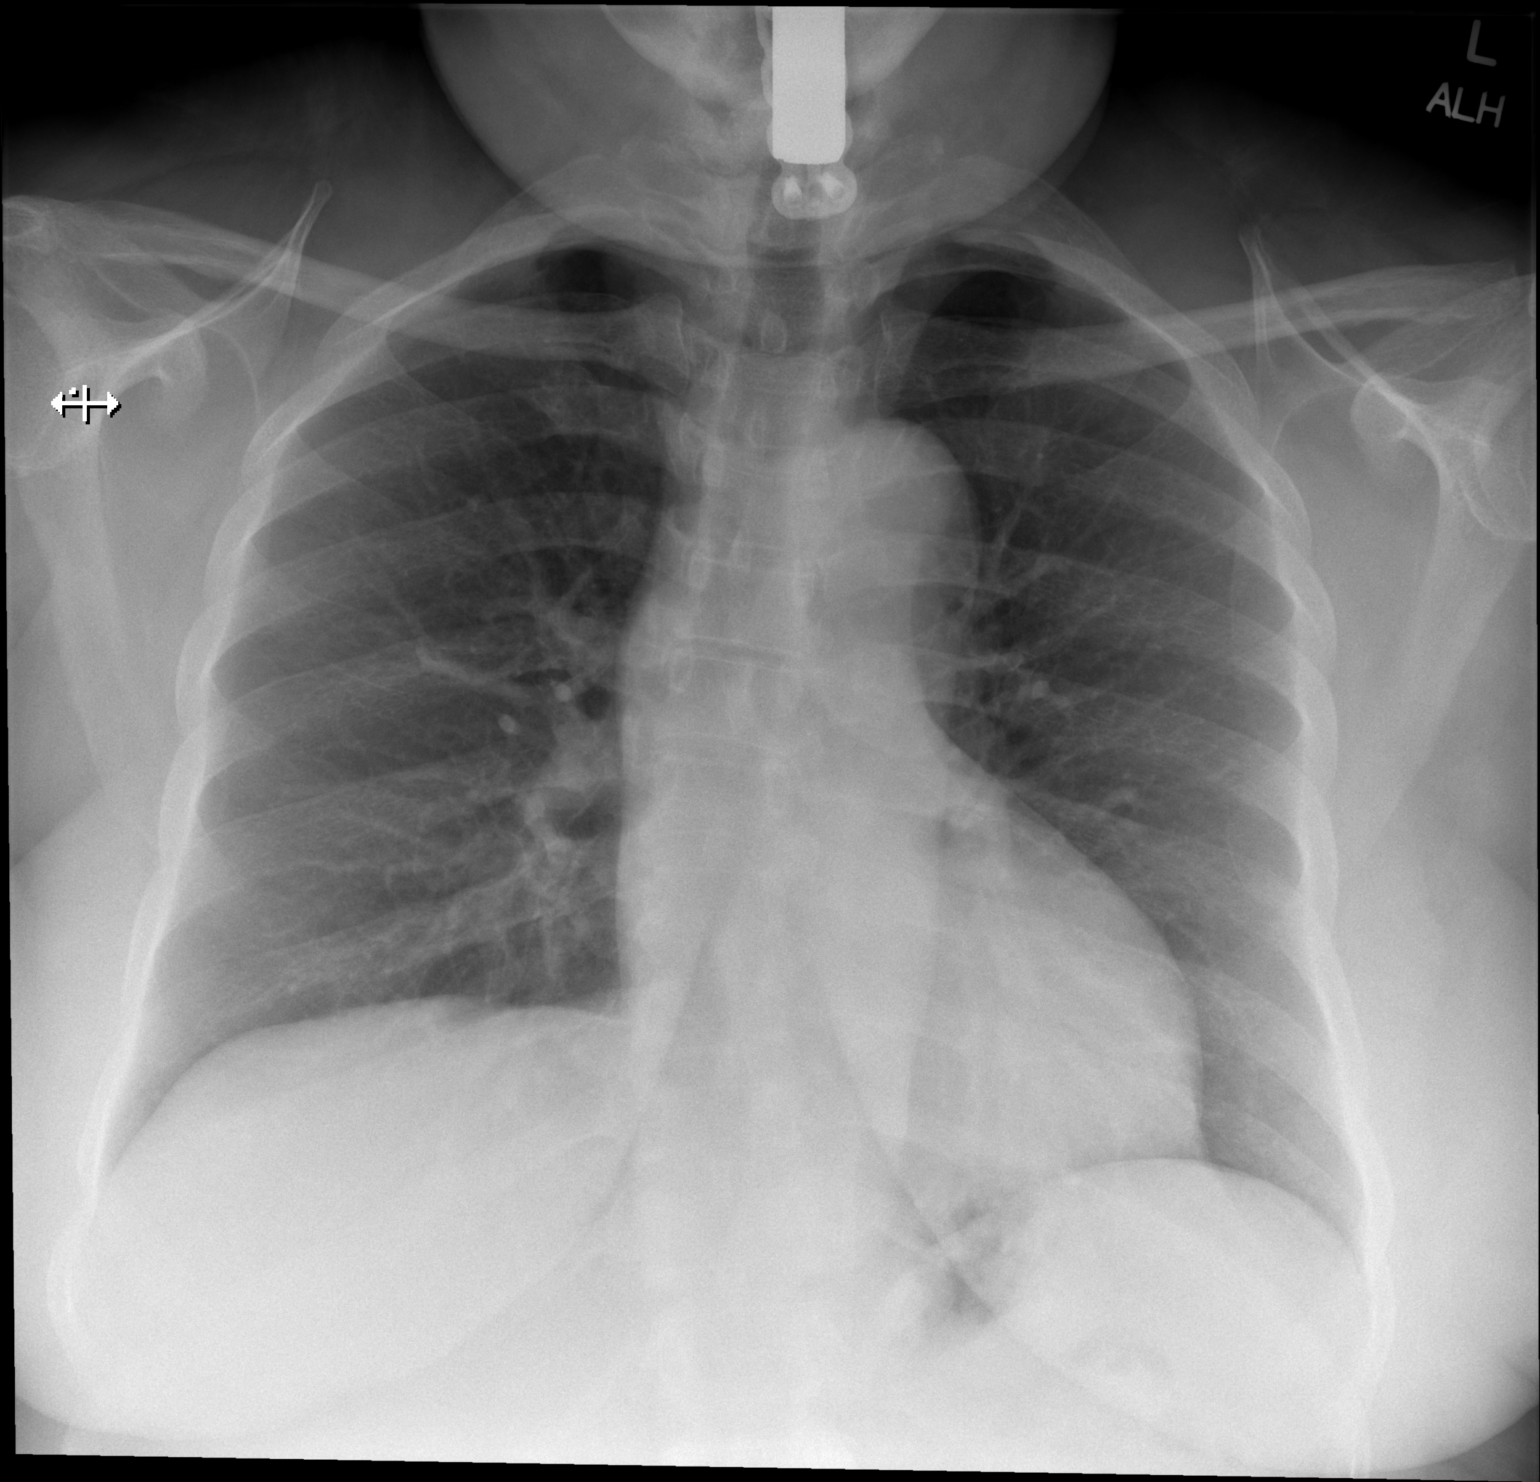

[w chest lat]
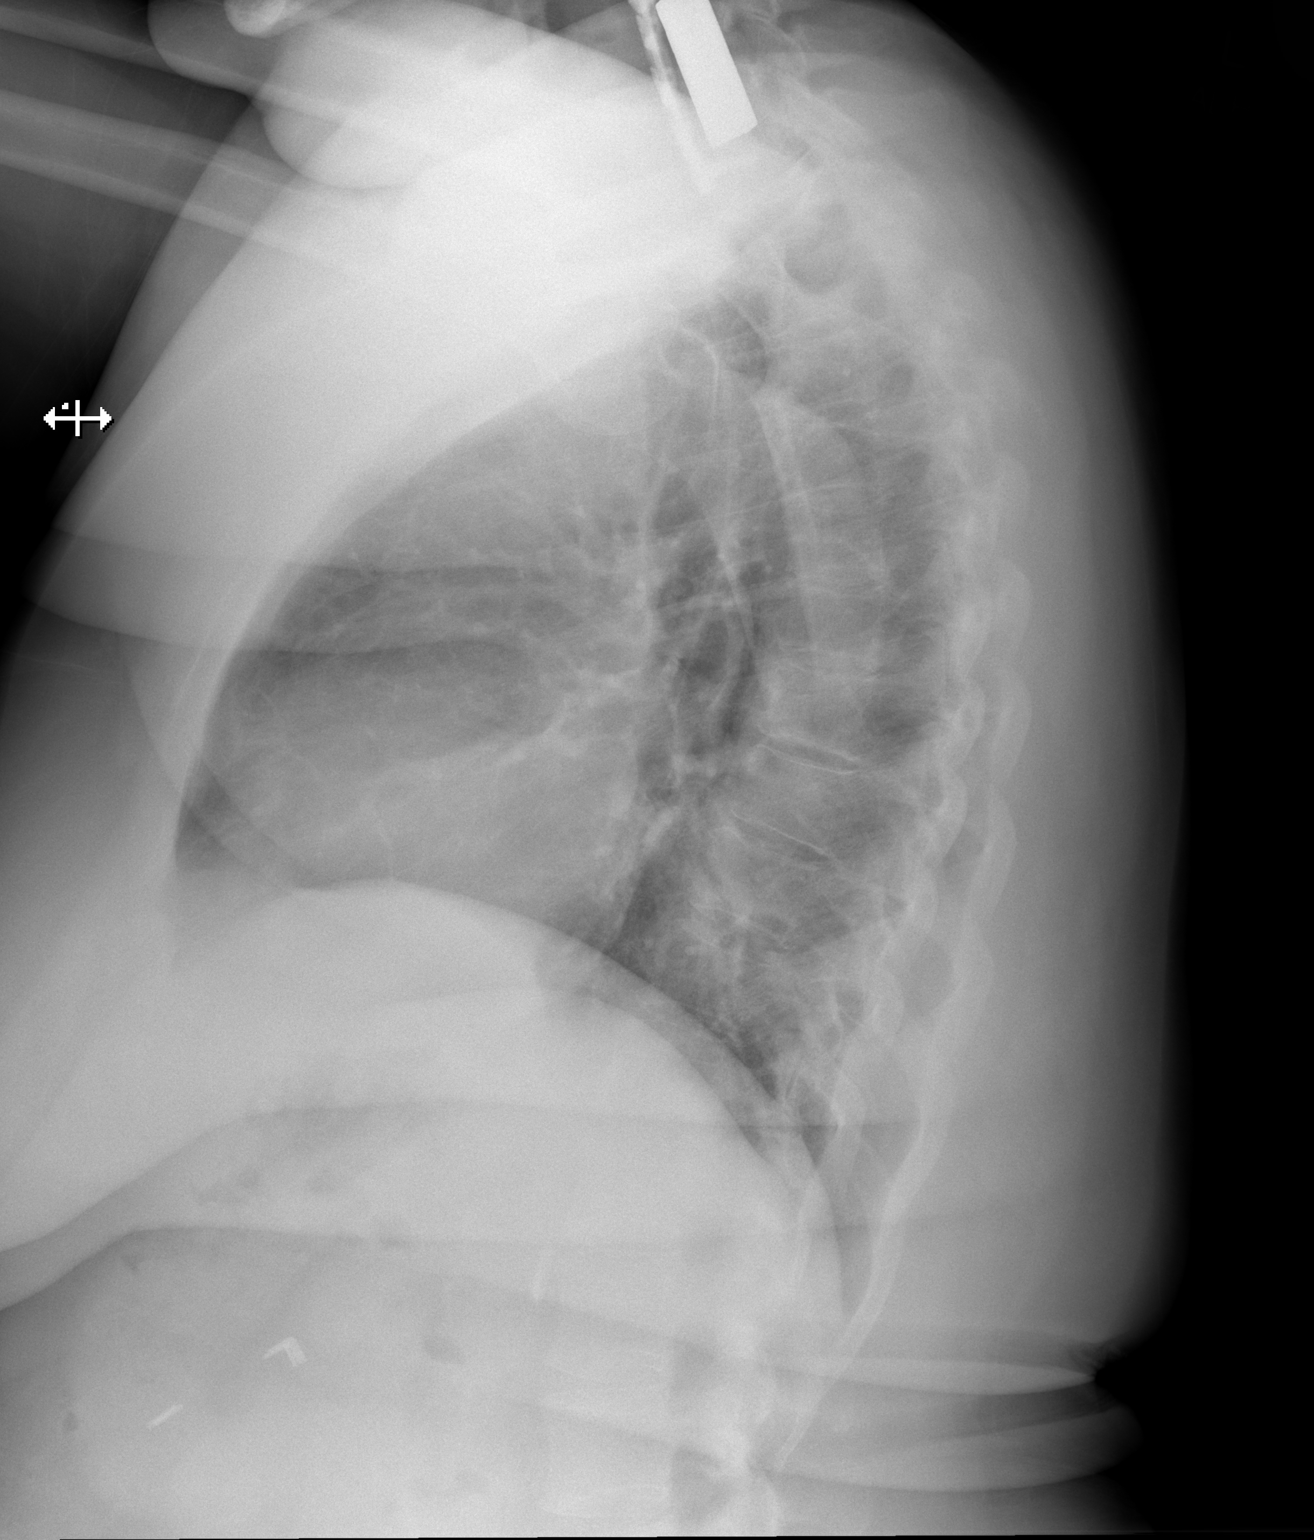

[2 of 2 positions shown; findings below may reference images not displayed]

FINDINGS: Cardiomediastinal silhouette unchanged in size and contour. No
evidence of central vascular congestion. No interlobular septal
thickening.

No pneumothorax or pleural effusion. Coarsened interstitial
markings, with no confluent airspace disease.

No acute displaced fracture. Degenerative changes of the spine.

Mild scoliotic curvature again demonstrated.

Surgical changes of the cervical region
IMPRESSION: Negative for acute cardiopulmonary disease

## 2021-08-09 DIAGNOSIS — L309 Dermatitis, unspecified: Secondary | ICD-10-CM | POA: Diagnosis not present

## 2021-08-22 ENCOUNTER — Other Ambulatory Visit: Payer: Self-pay

## 2021-08-22 ENCOUNTER — Encounter: Payer: Self-pay | Admitting: Nurse Practitioner

## 2021-08-22 ENCOUNTER — Telehealth (INDEPENDENT_AMBULATORY_CARE_PROVIDER_SITE_OTHER): Payer: Medicare HMO | Admitting: Nurse Practitioner

## 2021-08-22 DIAGNOSIS — Z8616 Personal history of COVID-19: Secondary | ICD-10-CM | POA: Diagnosis not present

## 2021-08-22 DIAGNOSIS — R5381 Other malaise: Secondary | ICD-10-CM

## 2021-08-22 DIAGNOSIS — M79605 Pain in left leg: Secondary | ICD-10-CM | POA: Diagnosis not present

## 2021-08-22 DIAGNOSIS — M79604 Pain in right leg: Secondary | ICD-10-CM

## 2021-08-22 NOTE — Patient Instructions (Addendum)
History of Covid 19 Fatigue Hip, leg, joint pain Shortness of breath:   Stay well hydrated  Stay active  May take tylenol or fever or pain  Will place referral to physical therapy  May take Flexeril - patient has prescription at home  May need PMR referral if not improving  Headache:  Magnesium 600 mg daily  Riboflavin 400 mg daily  May need neurology referral if not improving    Follow up:  Follow up in 6-8 weeks or sooner if needed - may need xray and autoimmune workup

## 2021-08-22 NOTE — Assessment & Plan Note (Signed)
Fatigue Hip, leg, joint pain Shortness of breath:   Stay well hydrated  Stay active  May take tylenol or fever or pain  Will place referral to physical therapy  May take Flexeril - patient has prescription at home  May need PMR referral if not improving  Headache:  Magnesium 600 mg daily  Riboflavin 400 mg daily  May need neurology referral if not improving    Follow up:  Follow up in 6-8 weeks or sooner if needed - may need xray and autoimmune workup

## 2021-08-22 NOTE — Progress Notes (Signed)
Virtual Visit via Telephone Note  I connected with Melchor Amour on 08/22/21 at  2:30 PM EDT by telephone and verified that I am speaking with the correct person using two identifiers.  Location: Patient: home Provider: office   I discussed the limitations, risks, security and privacy concerns of performing an evaluation and management service by telephone and the availability of in person appointments. I also discussed with the patient that there may be a patient responsible charge related to this service. The patient expressed understanding and agreed to proceed.   History of Present Illness:  Patient presents today for televisit for post-COVID care visit.  Patient tested positive for COVID in June 2022.  Patient complains today of ongoing extreme fatigue, bilateral lower extremity pain, headaches, shortness of breath. Patient states that she has gained weight due to inactivity. Denies f/c/s, n/v/d, hemoptysis, PND, chest pain or edema.      Observations/Objective:  Vitals with BMI 05/15/2021 05/15/2021 05/15/2021  Height - - -  Weight - - -  BMI - - -  Systolic [phone]  Diastolic 78 73 67  Pulse 74 75 81      Assessment and Plan:  Fatigue Hip, leg, joint pain Shortness of breath:   Stay well hydrated  Stay active  May take tylenol or fever or pain  Will place referral to physical therapy  May take Flexeril - patient has prescription at home  May need PMR referral if not improving  Headache:  Magnesium 600 mg daily  Riboflavin 400 mg daily  May need neurology referral if not improving    Follow up:  Follow up in 6-8 weeks or sooner if needed - may need xray and autoimmune workup       I discussed the assessment and treatment plan with the patient. The patient was provided an opportunity to ask questions and all were answered. The patient agreed with the plan and demonstrated an understanding of the instructions.   The patient was advised to  call back or seek an in-person evaluation if the symptoms worsen or if the condition fails to improve as anticipated.  I provided 23 minutes of non-face-to-face time during this encounter.   Fenton Foy, NP

## 2021-08-23 ENCOUNTER — Other Ambulatory Visit: Payer: Self-pay

## 2021-08-23 ENCOUNTER — Ambulatory Visit: Payer: Medicare HMO | Attending: Nurse Practitioner

## 2021-08-23 VITALS — BP 151/83

## 2021-08-23 DIAGNOSIS — Z7409 Other reduced mobility: Secondary | ICD-10-CM | POA: Insufficient documentation

## 2021-08-23 DIAGNOSIS — U099 Post covid-19 condition, unspecified: Secondary | ICD-10-CM | POA: Insufficient documentation

## 2021-08-23 DIAGNOSIS — M79604 Pain in right leg: Secondary | ICD-10-CM | POA: Insufficient documentation

## 2021-08-23 DIAGNOSIS — N3946 Mixed incontinence: Secondary | ICD-10-CM | POA: Diagnosis not present

## 2021-08-23 DIAGNOSIS — R262 Difficulty in walking, not elsewhere classified: Secondary | ICD-10-CM | POA: Insufficient documentation

## 2021-08-23 DIAGNOSIS — M6281 Muscle weakness (generalized): Secondary | ICD-10-CM | POA: Insufficient documentation

## 2021-08-23 DIAGNOSIS — M79605 Pain in left leg: Secondary | ICD-10-CM | POA: Insufficient documentation

## 2021-08-23 NOTE — Patient Instructions (Signed)
Aquatic Therapy at [facility]-  What to Expect!  Where:   [facility] [address] [facility] [place], [place] [address] Rehab phone [phone]  NOTE:  You will receive an automated phone message reminding you of your appt and it will say the appointment is at the [address] [facility] [address] [facility] clinic.          How to Prepare: Please make sure you drink 8 ounces of water about one hour prior to your pool session A caregiver may attend if needed with the patient to help assist as needed. A caregiver can sit in the pool room on chair. Please arrive IN YOUR SUIT and 15 minutes prior to your appointment - this helps to avoid delays in starting your session. Please make sure to attend to any toileting needs prior to entering the pool Locker rooms for changing are provided.   There is direct access to the pool deck form the locker room.  You can lock your belongings in a locker with lock provided. Once on the pool deck your therapist will ask if you have signed the Patient  Consent and Assignment of Benefits form before beginning treatment Your therapist may take your blood pressure prior to, during and after your session if indicated We usually try and create a home exercise program based on activities we do in the pool.  Please be thinking about who might be able to assist you in the pool should you need to participate in an aquatic home exercise program at the time of discharge if you need assistance.  Some patients do not want to or do not have the ability to participate in an aquatic home program - this is not a barrier in any way to you participating in aquatic therapy as part of your current therapy plan! After Discharge from PT, you can continue using home program at  the [facility]/, there is a drop-in fee for $5 ($45 a month)or for 60 years  or older $4.00 ($40 a month for seniors ) or any local [employer] pool.  Memberships for purchase are  available for gym/pool at [facility]  IT IS VERY IMPORTANT THAT YOUR LAST VISIT BE IN THE CLINIC AT [address] AFTER YOUR LAST AQUATIC VISIT.  PLEASE MAKE SURE THAT YOU HAVE A [address]  APPOINTMENT SCHEDULED.   About the pool: Pool is located approximately 500 FT from the entrance of the building.  Please bring a support person if you need assistance traveling this      distance.   Your therapist will assist you in entering the water; there are two ways to           enter: stairs with railings, and a mechanical lift. Your therapist will determine the most appropriate way for you.  Water temperature is usually between 88-90 degrees  There may be up to 2 other swimmers in the pool at the same time  The pool deck is tile, please wear shoes with good traction if you prefer not to be barefoot.    Contact Info:  For appointment scheduling and cancellations:         Please call the [facility]  PH:[phone]              Aquatic Therapy  [facility]       All sessions are 45 minutes                                                    

## 2021-08-23 NOTE — Therapy (Signed)
[place], [place], [address] Phone: [phone]   Fax:  [phone]  Physical Therapy Evaluation  Patient Details  Name: Paula [name] MRN: [id number] Date of Birth: 1960/12/28 Referring Provider (PT): Fenton Foy, NP   Encounter Date: 08/23/2021   PT End of Session - 08/23/21 1230     Visit Number 1    Number of Visits 17    Date for PT Re-Evaluation 10/18/21    Authorization Type [employer]- requesting auth    PT Start Time 6644    PT Stop Time 1315    PT Time Calculation (min) 45 min    Activity Tolerance Patient tolerated treatment well    Behavior During Therapy [facility] for tasks assessed/performed             Past Medical History:  Diagnosis Date   Anxiety    pt stated anxiety episodes resemble stroke symptoms   Chronic lower back pain    Conversion disorder    Depression    Diverticulitis    Expressive aphasia 11/15/2016   Fibromyalgia    GERD (gastroesophageal reflux disease)    Heart murmur    High cholesterol    History of gastric ulcer    History of hiatal hernia    History of kidney stones    History of thyroid nodule    Hyperlipidemia    Hypertension    IBS (irritable bowel syndrome)    Intermittent vertigo    Migraine    "a few times/year now; maybe" (11/15/2016)   Obesity    OSA on CPAP    wears CPAP nightly ;study in epic 02-01-2014 (11/15/2016)   Osteoarthritis    "knees, hands, back, hips" (11/15/2016)   Pneumonia X 1   hx of   Psychogenic tremor    intermittant head tremor-(11/15/2016)   Refusal of blood transfusions as patient is Jehovah's Witness    RLS (restless legs syndrome)    Sleep apnea    CPAP   Urinary incontinence    Vertigo     Past Surgical History:  Procedure Laterality Date   ABDOMINAL HYSTERECTOMY  1990   ANTERIOR CERVICAL DECOMP/DISCECTOMY FUSION  02-10-2004   C4 -- C7   BACK SURGERY     CARDIOVASCULAR STRESS TEST  2017   Negative    CESAREAN SECTION  1977; [place] W/ RETROGRADES Right 06/07/2015   Procedure: CYSTOSCOPY WITH RETROGRADE PYELOGRAM;  Surgeon: Ardis Hughs, MD;  Location: [facility];  Service: Urology;  Laterality: Right;   CYSTOSCOPY WITH URETEROSCOPY AND STENT PLACEMENT Right 06/07/2015   Procedure: RIGHT URETEROSCOPY, LASER LITHOTRIPSY, STONE REMOVAL AND RIGHT URETERAL STENT PLACEMENT;  Surgeon: Ardis Hughs, MD;  [facility];  Service: Urology;  Laterality: Right;   HERNIA REPAIR     HOLMIUM LASER APPLICATION N/A 0/01/4741   Procedure: HOLMIUM LASER APPLICATION;  Surgeon: Ardis Hughs, MD;  Location: [facility]  Service: Urology;  Laterality: N/A;   LAPAROSCOPIC CHOLECYSTECTOMY  10-07-2000   LIPOMA EXCISION  01/2017   from neck   UMBILICAL HERNIA REPAIR  age 55   UPPER GI ENDOSCOPY      Vitals:   08/23/21 1254  BP: (!) 151/83      Subjective Assessment - 08/23/21 1230     Subjective Patient reports having COVID back in June and it zapped the energy out of her. She was very weak  from [place] and had difficulty with household ambulation. She was also having pain in her back that radiated to the posterior aspect of BLEs and anterior knee pain. Prior to [place] she has never experienced this pain before. She reports having no energy or stamina and this is not typical of her. She has also put on weight due to being inactive from her sickness. She reports everything is a struggle. She is on disability, but prior to [place] she spent most of her day cleaning, doing laundry, cooking and now she is unable to complete these activities daily.    Pertinent History psychogenic tremor,anxiety, convergence syndrome (patient reports this can last for 5 minutes- months, stating it looks like she is having stroke); see extensive PMH above    How long can you sit comfortably? sitting is comfortable    How long can you stand  comfortably? maybe 5 minutes    How long can you walk comfortably? maybe 5 minutes    Patient Stated Goals More energy and mobility    Currently in Pain? No/denies                [facility] PT Assessment - 08/23/21 0001       Assessment   Medical Diagnosis Z86.16 (ICD-10-CM) - History of COVID-19  R53.81 (ICD-10-CM) - Physical deconditioning  M79.604,M79.605 (ICD-10-CM) - Pain in both lower extremities    Referring Provider (PT) Fenton Foy, NP    Onset Date/Surgical Date --   June 2022   Hand Dominance Right    Next MD Visit 10/18/21    Prior Therapy yes- for vertigo      Precautions   Precautions Fall      Restrictions   Weight Bearing Restrictions No      Balance Screen   Has the patient fallen in the past 6 months No   but feels like she stumbles   Has the patient had a decrease in activity level because of a fear of falling?  No    Is the patient reluctant to leave their home because of a fear of falling?  No      Home Environment   Living Environment Private residence    Living Arrangements Spouse/significant other    Type of [place]    Additional Comments stairs (unable to ascend/descend currently)      Prior Function   Level of Independence Independent   takes significant time with household activity   Vocation On disability    Leisure entertaining at the house, gardening      Cognition   Overall Cognitive Status Within Functional Limits for tasks assessed      Observation/Other Assessments   Focus on Therapeutic Outcomes (FOTO)  48% function to 56% predicted      Sensation   Light Touch Not tested      Coordination   Gross Motor Movements are Fluid and Coordinated Yes      Posture/Postural Control   Posture/Postural Control Postural limitations    Postural Limitations Rounded Shoulders;Forward head      Strength   Right Hip Flexion 4+/5    Right Hip ABduction 4-/5    Left Hip Flexion 4-/5    Left Hip ABduction 4-/5    Right Knee Flexion  5/5    Right Knee Extension 5/5    Left Knee Flexion 5/5    Left Knee Extension 5/5      Flexibility   Soft Tissue Assessment /Muscle Length yes  Hamstrings tight bilaterally    Quadriceps tight bilaterally      Transfers   Five time sit to stand comments  18 seconds      6 Minute Walk- Baseline   6 Minute Walk- Baseline yes    HR (bpm) 71    02 Sat (%RA) 97 %      6 Minute walk- Post Test   6 Minute Walk Post Test yes    HR (bpm) 106    02 Sat (%RA) 97 %    Modified Borg Scale for Dyspnea 5- Strong or hard breathing    Perceived Rate of Exertion (Borg) 15- Hard      6 minute walk test results    Aerobic Endurance Distance Walked 1295                        Objective measurements completed on examination: See above findings.       [facility] Adult PT Treatment/Exercise - 08/23/21 0001       Self-Care   Self-Care Other Self-Care Comments    Other Self-Care Comments  see patient education                     PT Education - 08/23/21 1425     Education Details Education on current condition, POC, and HEP.    Person(s) Educated Patient    Methods Explanation;Demonstration;Verbal cues;Handout    Comprehension Verbalized understanding;Returned demonstration;Verbal cues required              PT Short Term Goals - 08/23/21 1411       PT SHORT TERM GOAL #1   Title Patient will be independent with initial HEP.    Baseline issued at eval.    Status New    Target Date 09/06/21      PT SHORT TERM GOAL #2   Title Patient will complete 5 x STS in less than 12 seconds to signify improvements in functional strength.    Baseline see flowsheet    Status New    Target Date 09/20/21      PT SHORT TERM GOAL #3   Title Patient will complete 6 MWT reporting </= 12/20 on BORG RPE to signify improved tolerance to aerobic activity.    Baseline 15/20    Status New    Target Date 09/20/21               PT Long Term Goals - 08/23/21 1416        PT LONG TERM GOAL #1   Title Patient will be able to complete 15 reps of step ups from 8 inch step to signify ability to complete stair negotation at home.    Baseline unable to go up and down stairs at home    Status New    Target Date 10/18/21      PT LONG TERM GOAL #2   Title Patient will demonstrate at least 4+/5 strength in bilateral assessed hip musculature to improve stability about the chain with prolonged walking.    Baseline see flowsheet    Status New    Target Date 10/18/21      PT LONG TERM GOAL #3   Title Patient will report pain at worst as </= 6/10 to signify improvements in her overall condition    Baseline at worst 10/10 with prolonged standing and walking    Status New    Target Date 10/18/21  PT LONG TERM GOAL #4   Title Patient will tolerate at least 20 minutes of continous standing activity during PT session to signify improved tolerance to household activity.    Baseline tolerates 5 minutes of standing/walking activity.    Status New    Target Date 10/18/21      PT LONG TERM GOAL #5   Title Patient will score at least 56% function on FOTO to signify clinically meaningul improvement in functional abilities.    Baseline 48%    Status New    Target Date 10/18/21                    Plan - 08/23/21 1247     Clinical Impression Statement Patient is a 60 y/o female who presents to OPPT with chief complaint of fatigue, generalized weakness, and BLE pain secondary to COVID-19 in June 2022. She reports having difficulty with household activities such as cooking, cleaning, and laundry stating that it can take her days to complete these tasks. She reports back pain that radiates to posterior BLEs and anterior knee pain that is exacerbated by prolonged standing and walking. Upon assessment she is noted to have bilateral hip weakness and decreased functional strength based upon her 5xSTS score. She quickly fatigues during 6MWT rating her SOB as 5/10 of  modified dyspnea scale and RPE at 15/20 on borg scale at conclusion of test. Her vitals remained stable throughout functional testing today. She will benefit from skilled PT to address the above stated deficits in order to improve her functional mobility and assist in returning to PLOF.    Personal Factors and Comorbidities Age;Time since onset of injury/illness/exacerbation;Fitness;Comorbidity 3+    Comorbidities see PMH above    Examination-Activity Limitations Squat;Stairs;Stand;Lift;Carry;Locomotion Level    Examination-Participation Restrictions Cleaning;Yard Work;Meal Prep;Community Activity    Stability/Clinical Decision Making Evolving/Moderate complexity    Clinical Decision Making Moderate    Rehab Potential Good    PT Frequency 2x / week    PT Duration 8 weeks    PT Treatment/Interventions ADLs/Self Care Home Management;Aquatic Therapy;Cryotherapy;Moist Heat;Electrical Stimulation;Gait training;Stair training;Functional mobility training;Therapeutic activities;Therapeutic exercise;Balance training;Neuromuscular re-education;Patient/family education;Manual techniques;Passive range of motion;Dry needling;Taping    PT Next Visit Plan review HEP, NuStep, further assessment of back pain, hip strengthening    PT Home Exercise Plan Access Code [email or web address]    Recommended Other Services aquatic PT    Consulted and Agree with Plan of Care Patient             Patient will benefit from skilled therapeutic intervention in order to improve the following deficits and impairments:  Difficulty walking, Obesity, Decreased endurance, Decreased activity tolerance, Pain, Impaired flexibility, Decreased balance, Postural dysfunction, Decreased strength, Decreased mobility  Visit Diagnosis: Muscle weakness (generalized)  Difficulty in walking, not elsewhere classified  COVID-19 long hauler manifesting chronic decreased mobility and endurance  Pain in both lower extremities     Problem  List Patient Active Problem List   Diagnosis Date Noted   Physical deconditioning 08/22/2021   History of COVID-19 01/04/2021   Adult onset stuttering 06/30/2020   Migraine 06/30/2020   Fibromyalgia 06/30/2020   Type 2 diabetes mellitus without complication ([place]) 96/28/3662   TIA (transient ischemic attack) 06/30/2020   Atypical chest pain 08/13/2018   Depression, major 03/17/2018   Chronic pain syndrome 03/17/2018   Expressive aphasia 11/15/2016   Cough 11/08/2015   B12 deficiency 12/07/2014   Wart viral 11/07/2014   Coarse tremors 02/09/2014   OSA on CPAP  01/28/2014   Psychogenic movement disorder 01/14/2014   Dyspnea 01/12/2014   Tremor of face and hands 01/12/2014   Breast pain in female 02/09/2013   Elevated WBC count 10/12/2012   Abdominal pain, other specified site 10/09/2012   Abdominal bloating 10/09/2012   Ovarian cystic mass 05/25/2012   Shoulder pain, right 03/11/2012   Right arm pain 12/30/2011   Acute sinusitis 07/30/2010   ACNE, ROSACEA 07/17/2010   LEG PAIN 07/17/2010   NECK PAIN 05/10/2010   ABDOMINAL PAIN, EPIGASTRIC 05/10/2010   BURSITIS, LEFT HIP 05/01/2010   PLANTAR FASCIITIS, BILATERAL 05/01/2010   HYPERGLYCEMIA 01/05/2010   PRURITUS 11/27/2009   TOBACCO USE, QUIT 11/27/2009   Anxiety state 11/22/2008   FATIGUE 11/22/2008   Essential hypertension 10/07/2008   Insomnia, unspecified 08/30/2008   Obesity 08/19/2008   NAUSEA ALONE 05/02/2008   VERTIGO 01/19/2008   DIARRHEA 01/19/2008   THYROID NODULE 06/23/2007   Dyslipidemia 06/23/2007   Situational depression 06/23/2007   ADD 06/23/2007   TMJ SYNDROME 06/23/2007   GERD 06/23/2007   Irritable bowel syndrome 06/23/2007   Headache 06/23/2007   Referring diagnosis? Z86.16 (ICD-10-CM) - History of COVID-19  R53.81 (ICD-10-CM) - Physical deconditioning  M79.604,M79.605 (ICD-10-CM) - Pain in both lower extremities  Treatment diagnosis? (if different than referring diagnosis)  Muscle weakness  (generalized)  Difficulty in walking, not elsewhere classified  COVID-19 long hauler manifesting chronic decreased mobility and endurance  Pain in both lower extremities What was this (referring dx) caused by? []  Surgery []  Fall []  Ongoing issue []  Arthritis [x]  Other: ___COVID_________  Laterality: []  Rt []  Lt [x]  Both  Check all possible CPT codes:      []  86578 (Therapeutic Exercise)  []  92507 (SLP Treatment)  []  97112 (Neuro Re-ed)   []  92526 (Swallowing Treatment)   []  97116 (Gait Training)   []  [id number] (Cognitive Training, 1st 15 minutes) []  97140 (Manual Therapy)   []  97130 (Cognitive Training, each add'l 15 minutes)  []  97530 (Therapeutic Activities)  []  Other, List CPT Code ____________    []  46962 (Self Care)       [x]  All codes above (97110 - 97535)  []  97012 (Mechanical Traction)  [x]  97014 (E-stim Unattended)  []  97032 (E-stim manual)  []  97033 (Ionto)  []  97035 (Ultrasound)  []  97760 (Orthotic Fit) []  [id number] (Physical Performance Training) [x]  [id number] (Aquatic Therapy) []  97034 (Contrast Bath) []  [id number] (Paraffin) []  97597 (Wound Care 1st 20 sq cm) []  97598 (Wound Care each add'l 20 sq cm) []  97016 (Vasopneumatic Device) []  [id number] (Orthotic Training) []  [id number] (Prosthetic Training) Gwendolyn Grant, PT, DPT, ATC 08/23/21 2:38 PM  [facility] [address] [place], [place], [address] Phone: [phone]   Fax:  [phone]  Name: CARLING LIBERMAN MRN: [id number] Date of Birth: 05-20-1961

## 2021-08-24 ENCOUNTER — Telehealth: Payer: Self-pay | Admitting: Internal Medicine

## 2021-08-24 ENCOUNTER — Other Ambulatory Visit: Payer: Self-pay

## 2021-08-24 DIAGNOSIS — R1013 Epigastric pain: Secondary | ICD-10-CM

## 2021-08-24 DIAGNOSIS — K219 Gastro-esophageal reflux disease without esophagitis: Secondary | ICD-10-CM

## 2021-08-24 NOTE — Telephone Encounter (Signed)
Pt states that she feels her digestion of food is getting worse; states that it feels that food is getting stuck and not digesting well and making her feel very uncomfortable along with some  nausea, Pt states that she does have abdominal swelling after she eats and that she is gaining weight although she feels that her appetite is poor. Pt states that she feels this is more of an Endo issue. Please advise.

## 2021-08-24 NOTE — Telephone Encounter (Signed)
Pt given Dr. Henrene Pastor recommendations. Orders for gastric emptying scan placed in Epic. Message sent to xray scheduling to schedule and notify pt of time and date of test. Pt verbalized understanding with all questions answered.

## 2021-08-24 NOTE — Telephone Encounter (Signed)
Patient called states she is having issues with food getting stuck and being nauseous seeking advise.

## 2021-08-24 NOTE — Telephone Encounter (Signed)
She recently underwent colonoscopy and upper endoscopy which were unrevealing. 1.  My principal recommendation is exercise and weight loss 2.  Eat small portions 3.  We will obtain a solid-phase gastric emptying scan to rule out delayed gastric emptying.  Remo Lipps please arrange.  We will contact the patient after the results of the solid-phase gastric emptying scan have been completed. Thanks

## 2021-08-27 ENCOUNTER — Other Ambulatory Visit: Payer: Self-pay

## 2021-08-27 ENCOUNTER — Ambulatory Visit: Payer: Medicare HMO

## 2021-08-27 DIAGNOSIS — R262 Difficulty in walking, not elsewhere classified: Secondary | ICD-10-CM | POA: Diagnosis not present

## 2021-08-27 DIAGNOSIS — M6281 Muscle weakness (generalized): Secondary | ICD-10-CM

## 2021-08-27 DIAGNOSIS — M79605 Pain in left leg: Secondary | ICD-10-CM | POA: Diagnosis not present

## 2021-08-27 DIAGNOSIS — Z7409 Other reduced mobility: Secondary | ICD-10-CM | POA: Diagnosis not present

## 2021-08-27 DIAGNOSIS — M79604 Pain in right leg: Secondary | ICD-10-CM

## 2021-08-27 DIAGNOSIS — U099 Post covid-19 condition, unspecified: Secondary | ICD-10-CM | POA: Diagnosis not present

## 2021-08-27 NOTE — Therapy (Signed)
[place], [place], [address] Phone: [phone]   Fax:  [phone]  Physical Therapy Treatment  Patient Details  Name: Paula [name] MRN: [id number] Date of Birth: Apr 11, 1961 Referring Provider (PT): Fenton Foy, NP   Encounter Date: 08/27/2021   PT End of Session - 08/27/21 1105     Visit Number 2    Number of Visits 17    Date for PT Re-Evaluation 10/18/21    Authorization Type [employer]    Authorization Time Period 9/22-11/18/22    Authorization - Visit Number 1    Authorization - Number of Visits 16    PT Start Time 1100    PT Stop Time 2376    PT Time Calculation (min) 44 min    Activity Tolerance Patient tolerated treatment well    Behavior During Therapy [facility] for tasks assessed/performed             Past Medical History:  Diagnosis Date   Anxiety    pt stated anxiety episodes resemble stroke symptoms   Chronic lower back pain    Conversion disorder    Depression    Diverticulitis    Expressive aphasia 11/15/2016   Fibromyalgia    GERD (gastroesophageal reflux disease)    Heart murmur    High cholesterol    History of gastric ulcer    History of hiatal hernia    History of kidney stones    History of thyroid nodule    Hyperlipidemia    Hypertension    IBS (irritable bowel syndrome)    Intermittent vertigo    Migraine    "a few times/year now; maybe" (11/15/2016)   Obesity    OSA on CPAP    wears CPAP nightly ;study in epic 02-01-2014 (11/15/2016)   Osteoarthritis    "knees, hands, back, hips" (11/15/2016)   Pneumonia X 1   hx of   Psychogenic tremor    intermittant head tremor-(11/15/2016)   Refusal of blood transfusions as patient is Jehovah's Witness    RLS (restless legs syndrome)    Sleep apnea    CPAP   Urinary incontinence    Vertigo     Past Surgical History:  Procedure Laterality Date   ABDOMINAL HYSTERECTOMY  1990   ANTERIOR CERVICAL  DECOMP/DISCECTOMY FUSION  02-10-2004   C4 -- C7   BACK SURGERY     CARDIOVASCULAR STRESS TEST  2017   Negative   CESAREAN SECTION  1977; [place] W/ RETROGRADES Right 06/07/2015   Procedure: CYSTOSCOPY WITH RETROGRADE PYELOGRAM;  Surgeon: Ardis Hughs, MD;  Location: [facility];  Service: Urology;  Laterality: Right;   CYSTOSCOPY WITH URETEROSCOPY AND STENT PLACEMENT Right 06/07/2015   Procedure: RIGHT URETEROSCOPY, LASER LITHOTRIPSY, STONE REMOVAL AND RIGHT URETERAL STENT PLACEMENT;  Surgeon: Ardis Hughs, MD;  Location: [facility];  Service: Urology;  Laterality: Right;   HERNIA REPAIR     HOLMIUM LASER APPLICATION N/A 01/09/3150   Procedure: HOLMIUM LASER APPLICATION;  Surgeon: Ardis Hughs, MD;  Location: [facility];  Service: Urology;  Laterality: N/A;   LAPAROSCOPIC CHOLECYSTECTOMY  10-07-2000   LIPOMA EXCISION  01/2017   from neck   UMBILICAL HERNIA REPAIR  age 64   UPPER GI ENDOSCOPY      There were no vitals filed for this visit.   Subjective Assessment - 08/27/21 1104     Subjective Patient  reports the knees are a little stiff and painful currently. She reports completing HEP twice a day.    Currently in Pain? Yes    Pain Score 6     Pain Location Knee    Pain Orientation Left;Right    Pain Descriptors / Indicators Tightness;Aching    Pain Type Chronic pain    Pain Onset More than a month ago    Pain Frequency Intermittent    Aggravating Factors  nothing    Pain Relieving Factors medication                [facility] PT Assessment - 08/27/21 0001       AROM   Overall AROM Comments Lumbar AROM WNL; N/T with flexion, pain with Lt sidebend      Special Tests   Other special tests (+) SLR bilaterally               [facility] Adult PT Treatment/Exercise:  Therapeutic Exercise: - NuStep level 5 x 5 minutes  - Standing lumbar extension 1  x10  - prone quad stretch 60 sec  each - seated hamstring stretch 60 sec each  - sit to stand 2 x 10  - standing march 2 x 10  - standing hip abduction 2 x 10  - standing hip extension 2 x 10   Manual Therapy: - n/a  Neuromuscular re-ed: - n/a  Therapeutic Activity: - n/a  Self-care/Home Management: - See patient education                       PT Education - 08/27/21 1145     Education Details Reviewed FOTO score and anticipated progress, reviewed and updated HEP, discussed implementing walking program with handout provided. education on centralization phenomenon.    Person(s) Educated Patient    Methods Explanation;Demonstration;Verbal cues;Handout    Comprehension Verbalized understanding;Returned demonstration;Verbal cues required              PT Short Term Goals - 08/23/21 1411       PT SHORT TERM GOAL #1   Title Patient will be independent with initial HEP.    Baseline issued at eval.    Status New    Target Date 09/06/21      PT SHORT TERM GOAL #2   Title Patient will complete 5 x STS in less than 12 seconds to signify improvements in functional strength.    Baseline see flowsheet    Status New    Target Date 09/20/21      PT SHORT TERM GOAL #3   Title Patient will complete 6 MWT reporting </= 12/20 on BORG RPE to signify improved tolerance to aerobic activity.    Baseline 15/20    Status New    Target Date 09/20/21               PT Long Term Goals - 08/23/21 1416       PT LONG TERM GOAL #1   Title Patient will be able to complete 15 reps of step ups from 8 inch step to signify ability to complete stair negotation at home.    Baseline unable to go up and down stairs at home    Status New    Target Date 10/18/21      PT LONG TERM GOAL #2   Title Patient will demonstrate at least 4+/5 strength in bilateral assessed hip musculature to improve stability about the chain with prolonged walking.  Baseline see flowsheet    Status New    Target Date 10/18/21       PT LONG TERM GOAL #3   Title Patient will report pain at worst as </= 6/10 to signify improvements in her overall condition    Baseline at worst 10/10 with prolonged standing and walking    Status New    Target Date 10/18/21      PT LONG TERM GOAL #4   Title Patient will tolerate at least 20 minutes of continous standing activity during PT session to signify improved tolerance to household activity.    Baseline tolerates 5 minutes of standing/walking activity.    Status New    Target Date 10/18/21      PT LONG TERM GOAL #5   Title Patient will score at least 56% function on FOTO to signify clinically meaningul improvement in functional abilities.    Baseline 48%    Status New    Target Date 10/18/21                   Plan - 08/27/21 1126     Clinical Impression Statement Further assessment of patient's low back performed today with patient's findings consistent with lumbar radiculopathy as she reports numbness/tingling with lumbar flexion, relief of pain with lumbar extension, and positive SLR bilaterally. She responded well to extension biased movement and was issued repeated movement as part of her HEP today. She tolerated ther ex well today requiring intermittent standing rest breaks between standing strengthening exercises.    PT Treatment/Interventions ADLs/Self Care Home Management;Aquatic Therapy;Cryotherapy;Moist Heat;Electrical Stimulation;Gait training;Stair training;Functional mobility training;Therapeutic activities;Therapeutic exercise;Balance training;Neuromuscular re-education;Patient/family education;Manual techniques;Passive range of motion;Dry needling;Taping    PT Next Visit Plan hip and knee strengthening, core stabilization, assess response to repeated extension    PT Home Exercise Plan Access Code [email or web address]    Consulted and Agree with Plan of Care Patient             Patient will benefit from skilled therapeutic intervention in order to improve  the following deficits and impairments:  Difficulty walking, Obesity, Decreased endurance, Decreased activity tolerance, Pain, Impaired flexibility, Decreased balance, Postural dysfunction, Decreased strength, Decreased mobility  Visit Diagnosis: Muscle weakness (generalized)  Difficulty in walking, not elsewhere classified  COVID-19 long hauler manifesting chronic decreased mobility and endurance  Pain in both lower extremities     Problem List Patient Active Problem List   Diagnosis Date Noted   Physical deconditioning 08/22/2021   History of COVID-19 01/04/2021   Adult onset stuttering 06/30/2020   Migraine 06/30/2020   Fibromyalgia 06/30/2020   Type 2 diabetes mellitus without complication ([place]) 16/09/9603   TIA (transient ischemic attack) 06/30/2020   Atypical chest pain 08/13/2018   Depression, major 03/17/2018   Chronic pain syndrome 03/17/2018   Expressive aphasia 11/15/2016   Cough 11/08/2015   B12 deficiency 12/07/2014   Wart viral 11/07/2014   Coarse tremors 02/09/2014   OSA on CPAP 01/28/2014   Psychogenic movement disorder 01/14/2014   Dyspnea 01/12/2014   Tremor of face and hands 01/12/2014   Breast pain in female 02/09/2013   Elevated WBC count 10/12/2012   Abdominal pain, other specified site 10/09/2012   Abdominal bloating 10/09/2012   Ovarian cystic mass 05/25/2012   Shoulder pain, right 03/11/2012   Right arm pain 12/30/2011   Acute sinusitis 07/30/2010   ACNE, ROSACEA 07/17/2010   LEG PAIN 07/17/2010   NECK PAIN 05/10/2010   ABDOMINAL PAIN, EPIGASTRIC 05/10/2010   BURSITIS,  LEFT HIP 05/01/2010   PLANTAR FASCIITIS, BILATERAL 05/01/2010   HYPERGLYCEMIA 01/05/2010   PRURITUS 11/27/2009   TOBACCO USE, QUIT 11/27/2009   Anxiety state 11/22/2008   FATIGUE 11/22/2008   Essential hypertension 10/07/2008   Insomnia, unspecified 08/30/2008   Obesity 08/19/2008   NAUSEA ALONE 05/02/2008   VERTIGO 01/19/2008   DIARRHEA 01/19/2008   THYROID NODULE  06/23/2007   Dyslipidemia 06/23/2007   Situational depression 06/23/2007   ADD 06/23/2007   TMJ SYNDROME 06/23/2007   GERD 06/23/2007   Irritable bowel syndrome 06/23/2007   Headache 06/23/2007   Gwendolyn Grant, PT, DPT, ATC 08/27/21 11:53 AM   [facility] [address] [place], [place], [address] Phone: [phone]   Fax:  [phone]  Name: Paula [name] MRN: [id number] Date of Birth: 12-Nov-1961

## 2021-08-28 NOTE — Telephone Encounter (Signed)
Pt had questions in regard to the Gastric Emptying scan that was ordered for her. All questions were answered and pt verbalized understanding.

## 2021-08-28 NOTE — Telephone Encounter (Signed)
Inbound call from patient. Requesting a call back for further explanation about the gastric emptying

## 2021-08-29 ENCOUNTER — Ambulatory Visit: Payer: Medicare HMO

## 2021-08-29 ENCOUNTER — Other Ambulatory Visit: Payer: Self-pay

## 2021-08-29 DIAGNOSIS — M6281 Muscle weakness (generalized): Secondary | ICD-10-CM | POA: Diagnosis not present

## 2021-08-29 DIAGNOSIS — R262 Difficulty in walking, not elsewhere classified: Secondary | ICD-10-CM | POA: Diagnosis not present

## 2021-08-29 DIAGNOSIS — M79605 Pain in left leg: Secondary | ICD-10-CM | POA: Diagnosis not present

## 2021-08-29 DIAGNOSIS — M79604 Pain in right leg: Secondary | ICD-10-CM | POA: Diagnosis not present

## 2021-08-29 DIAGNOSIS — U099 Post covid-19 condition, unspecified: Secondary | ICD-10-CM | POA: Diagnosis not present

## 2021-08-29 DIAGNOSIS — Z7409 Other reduced mobility: Secondary | ICD-10-CM

## 2021-08-29 NOTE — Therapy (Signed)
[place], [place], [address] Phone: [phone]   Fax:  [phone]  Physical Therapy Treatment  Patient Details  Name: Paula [name] MRN: [id number] Date of Birth: Aug 16, 1961 Referring Provider (PT): Fenton Foy, NP   Encounter Date: 08/29/2021   PT End of Session - 08/29/21 1247     Visit Number 3    Number of Visits 17    Date for PT Re-Evaluation 10/18/21    Authorization Type [employer]    Authorization Time Period 9/22-11/18/22    Authorization - Visit Number 2    Authorization - Number of Visits 16    PT Start Time 3149    PT Stop Time 1331    PT Time Calculation (min) 43 min    Activity Tolerance Patient tolerated treatment well;Other (comment)   anxiety   Behavior During Therapy WFL for tasks assessed/performed             Past Medical History:  Diagnosis Date   Anxiety    pt stated anxiety episodes resemble stroke symptoms   Chronic lower back pain    Conversion disorder    Depression    Diverticulitis    Expressive aphasia 11/15/2016   Fibromyalgia    GERD (gastroesophageal reflux disease)    Heart murmur    High cholesterol    History of gastric ulcer    History of hiatal hernia    History of kidney stones    History of thyroid nodule    Hyperlipidemia    Hypertension    IBS (irritable bowel syndrome)    Intermittent vertigo    Migraine    "a few times/year now; maybe" (11/15/2016)   Obesity    OSA on CPAP    wears CPAP nightly ;study in epic 02-01-2014 (11/15/2016)   Osteoarthritis    "knees, hands, back, hips" (11/15/2016)   Pneumonia X 1   hx of   Psychogenic tremor    intermittant head tremor-(11/15/2016)   Refusal of blood transfusions as patient is Jehovah's Witness    RLS (restless legs syndrome)    Sleep apnea    CPAP   Urinary incontinence    Vertigo     Past Surgical History:  Procedure Laterality Date   ABDOMINAL HYSTERECTOMY  1990    ANTERIOR CERVICAL DECOMP/DISCECTOMY FUSION  02-10-2004   C4 -- C7   BACK SURGERY     CARDIOVASCULAR STRESS TEST  2017   Negative   CESAREAN SECTION  1977; 1982   COLONOSCOPY     CYSTOSCOPY W/ RETROGRADES Right 06/07/2015   Procedure: CYSTOSCOPY WITH RETROGRADE PYELOGRAM;  Surgeon: Ardis Hughs, MD;  Location[facility];  Service: Urology;  Laterality: Right;   CYSTOSCOPY WITH URETEROSCOPY AND STENT PLACEMENT Right 06/07/2015   Procedure: RIGHT URETEROSCOPY, LASER LITHOTRIPSY, STONE REMOVAL AND RIGHT URETERAL STENT PLACEMENT;  Surgeon: Ardis Hughs, MD;  Location: [name];  Service: Urology;  Laterality: Right;   HERNIA REPAIR     HOLMIUM LASER APPLICATION N/A 7/0/2637   Procedure: HOLMIUM LASER APPLICATION;  Surgeon: Ardis Hughs, MD;  Location[facility];  Service: Urology;  Laterality: N/A;   LAPAROSCOPIC CHOLECYSTECTOMY  10-07-2000   LIPOMA EXCISION  01/2017   from neck   UMBILICAL HERNIA REPAIR  age 44   UPPER GI ENDOSCOPY      There were no vitals filed for this visit.   Subjective Assessment - 08/29/21 1251  Subjective She reports soreness in her legs, but overall doing good.    Currently in Pain? No/denies             [facility] Adult PT Treatment/Exercise:   Therapeutic Exercise: - NuStep level 5 x 5 minutes  - resisted hamstring curl 2 x 10 @ 15 lbs - resisted knee extension 2 x 10 @ 10 lbs - forward step ups 6 inch 1 x 10  - lateral band walks at counter 3 sets down/back green band at shins - sit to stand 2 x 10 with 5 lb kettlebell    Not performed today*  - Standing lumbar extension 1 x 10  - prone quad stretch 60 sec each - seated hamstring stretch 60 sec each  - standing march 2 x 10  - standing hip abduction 2 x 10  - standing hip extension 2 x 10    Manual Therapy: - n/a   Neuromuscular re-ed: - n/a   Therapeutic Activity: - n/a   Self-care/Home Management: - See patient  education                                  PT Education - 08/29/21 1339     Education Details Updated HEP    Person(s) Educated Patient    Methods Explanation;Demonstration;Verbal cues;Handout    Comprehension Verbalized understanding;Returned demonstration              PT Short Term Goals - 08/23/21 1411       PT SHORT TERM GOAL #1   Title Patient will be independent with initial HEP.    Baseline issued at eval.    Status New    Target Date 09/06/21      PT SHORT TERM GOAL #2   Title Patient will complete 5 x STS in less than 12 seconds to signify improvements in functional strength.    Baseline see flowsheet    Status New    Target Date 09/20/21      PT SHORT TERM GOAL #3   Title Patient will complete 6 MWT reporting </= 12/20 on BORG RPE to signify improved tolerance to aerobic activity.    Baseline 15/20    Status New    Target Date 09/20/21               PT Long Term Goals - 08/23/21 1416       PT LONG TERM GOAL #1   Title Patient will be able to complete 15 reps of step ups from 8 inch step to signify ability to complete stair negotation at home.    Baseline unable to go up and down stairs at home    Status New    Target Date 10/18/21      PT LONG TERM GOAL #2   Title Patient will demonstrate at least 4+/5 strength in bilateral assessed hip musculature to improve stability about the chain with prolonged walking.    Baseline see flowsheet    Status New    Target Date 10/18/21      PT LONG TERM GOAL #3   Title Patient will report pain at worst as </= 6/10 to signify improvements in her overall condition    Baseline at worst 10/10 with prolonged standing and walking    Status New    Target Date 10/18/21      PT LONG TERM GOAL #4   Title Patient will tolerate  at least 20 minutes of continous standing activity during PT session to signify improved tolerance to household activity.    Baseline tolerates 5 minutes of  standing/walking activity.    Status New    Target Date 10/18/21      PT LONG TERM GOAL #5   Title Patient will score at least 56% function on FOTO to signify clinically meaningul improvement in functional abilities.    Baseline 48%    Status New    Target Date 10/18/21                   Plan - 08/29/21 1255     Clinical Impression Statement Overall patient tolerated session well with continued progression of hip and knee strengthening. She reported fogginess after completing first set of step ups, which subsided with seated rest breaks. When she attempted second set of step ups she reported feeling anxious and needed to sit and rest again. When questioned further about what caused her anxiety she reported the dual task of counting and coordinating her steps made her anxious, so this execise was discontinued. She was able to complete additional ther ex without feelings of anxiety. No reports of pain throughout session.    PT Treatment/Interventions ADLs/Self Care Home Management;Aquatic Therapy;Cryotherapy;Moist Heat;Electrical Stimulation;Gait training;Stair training;Functional mobility training;Therapeutic activities;Therapeutic exercise;Balance training;Neuromuscular re-education;Patient/family education;Manual techniques;Passive range of motion;Dry needling;Taping    PT Next Visit Plan hip and knee strengthening, core stabilization, dual tasking as able (walking with cognitive tasks)    PT Home Exercise Plan Access Code [email or web address]    Consulted and Agree with Plan of Care Patient             Patient will benefit from skilled therapeutic intervention in order to improve the following deficits and impairments:  Difficulty walking, Obesity, Decreased endurance, Decreased activity tolerance, Pain, Impaired flexibility, Decreased balance, Postural dysfunction, Decreased strength, Decreased mobility  Visit Diagnosis: Muscle weakness (generalized)  Difficulty in walking, not  elsewhere classified  COVID-19 long hauler manifesting chronic decreased mobility and endurance  Pain in both lower extremities     Problem List Patient Active Problem List   Diagnosis Date Noted   Physical deconditioning 08/22/2021   History of COVID-19 01/04/2021   Adult onset stuttering 06/30/2020   Migraine 06/30/2020   Fibromyalgia 06/30/2020   Type 2 diabetes mellitus without complication ([place]) 42/87/6811   TIA (transient ischemic attack) 06/30/2020   Atypical chest pain 08/13/2018   Depression, major 03/17/2018   Chronic pain syndrome 03/17/2018   Expressive aphasia 11/15/2016   Cough 11/08/2015   B12 deficiency 12/07/2014   Wart viral 11/07/2014   Coarse tremors 02/09/2014   OSA on CPAP 01/28/2014   Psychogenic movement disorder 01/14/2014   Dyspnea 01/12/2014   Tremor of face and hands 01/12/2014   Breast pain in female 02/09/2013   Elevated WBC count 10/12/2012   Abdominal pain, other specified site 10/09/2012   Abdominal bloating 10/09/2012   Ovarian cystic mass 05/25/2012   Shoulder pain, right 03/11/2012   Right arm pain 12/30/2011   Acute sinusitis 07/30/2010   ACNE, ROSACEA 07/17/2010   LEG PAIN 07/17/2010   NECK PAIN 05/10/2010   ABDOMINAL PAIN, EPIGASTRIC 05/10/2010   BURSITIS, LEFT HIP 05/01/2010   PLANTAR FASCIITIS, BILATERAL 05/01/2010   HYPERGLYCEMIA 01/05/2010   PRURITUS 11/27/2009   TOBACCO USE, QUIT 11/27/2009   Anxiety state 11/22/2008   FATIGUE 11/22/2008   Essential hypertension 10/07/2008   Insomnia, unspecified 08/30/2008   Obesity 08/19/2008   NAUSEA ALONE 05/02/2008  VERTIGO 01/19/2008   DIARRHEA 01/19/2008   THYROID NODULE 06/23/2007   Dyslipidemia 06/23/2007   Situational depression 06/23/2007   ADD 06/23/2007   TMJ SYNDROME 06/23/2007   GERD 06/23/2007   Irritable bowel syndrome 06/23/2007   Headache 06/23/2007   Gwendolyn Grant, PT, DPT, ATC 08/29/21 1:44 PM  [facility] [address] [place], [place], [address] Phone: [phone]   Fax:  [phone]  Name: Paula [name] MRN: [id number] Date of Birth: 1961/07/10

## 2021-09-03 ENCOUNTER — Encounter: Payer: Self-pay | Admitting: Physical Therapy

## 2021-09-03 ENCOUNTER — Ambulatory Visit: Payer: Medicare HMO | Attending: Nurse Practitioner | Admitting: Physical Therapy

## 2021-09-03 ENCOUNTER — Other Ambulatory Visit: Payer: Self-pay

## 2021-09-03 VITALS — HR 75

## 2021-09-03 DIAGNOSIS — M6281 Muscle weakness (generalized): Secondary | ICD-10-CM | POA: Diagnosis not present

## 2021-09-03 DIAGNOSIS — M79605 Pain in left leg: Secondary | ICD-10-CM | POA: Diagnosis not present

## 2021-09-03 DIAGNOSIS — Z7409 Other reduced mobility: Secondary | ICD-10-CM | POA: Insufficient documentation

## 2021-09-03 DIAGNOSIS — U099 Post covid-19 condition, unspecified: Secondary | ICD-10-CM | POA: Insufficient documentation

## 2021-09-03 DIAGNOSIS — R262 Difficulty in walking, not elsewhere classified: Secondary | ICD-10-CM | POA: Insufficient documentation

## 2021-09-03 DIAGNOSIS — M79604 Pain in right leg: Secondary | ICD-10-CM | POA: Diagnosis not present

## 2021-09-03 NOTE — Therapy (Signed)
[place], [place], [address] Phone: [phone]   Fax:  [phone]  Physical Therapy Treatment  Patient Details  Name: Paula [name] MRN: [id number] Date of Birth: [birth year] Referring Provider (PT): Fenton Foy, NP   Encounter Date: 09/03/2021   PT End of Session - 09/03/21 1359     Visit Number 4    Number of Visits 17    Date for PT Re-Evaluation 10/18/21    Authorization Type [employer]    Authorization Time Period 9/22-11/18/22    Authorization - Visit Number 3    Authorization - Number of Visits 16    PT Start Time 1400    PT Stop Time 6063    PT Time Calculation (min) 45 min             Past Medical History:  Diagnosis Date   Anxiety    pt stated anxiety episodes resemble stroke symptoms   Chronic lower back pain    Conversion disorder    Depression    Diverticulitis    Expressive aphasia 11/15/2016   Fibromyalgia    GERD (gastroesophageal reflux disease)    Heart murmur    High cholesterol    History of gastric ulcer    History of hiatal hernia    History of kidney stones    History of thyroid nodule    Hyperlipidemia    Hypertension    IBS (irritable bowel syndrome)    Intermittent vertigo    Migraine    "a few times/year now; maybe" (11/15/2016)   Obesity    OSA on CPAP    wears CPAP nightly ;study in epic 02-01-2014 (11/15/2016)   Osteoarthritis    "knees, hands, back, hips" (11/15/2016)   Pneumonia X 1   hx of   Psychogenic tremor    intermittant head tremor-(11/15/2016)   Refusal of blood transfusions as patient is Jehovah's Witness    RLS (restless legs syndrome)    Sleep apnea    CPAP   Urinary incontinence    Vertigo     Past Surgical History:  Procedure Laterality Date   ABDOMINAL HYSTERECTOMY  1990   ANTERIOR CERVICAL DECOMP/DISCECTOMY FUSION  02-10-2004   C4 -- C7   BACK SURGERY     CARDIOVASCULAR STRESS TEST  2017   Negative   CESAREAN  SECTION  1977; [place] W/ RETROGRADES Right 06/07/2015   Procedure: CYSTOSCOPY WITH RETROGRADE PYELOGRAM;  Surgeon: Ardis Hughs, MD;  Location: [facility];  Service: Urology;  Laterality: Right;   CYSTOSCOPY WITH URETEROSCOPY AND STENT PLACEMENT Right 06/07/2015   Procedure: RIGHT URETEROSCOPY, LASER LITHOTRIPSY, STONE REMOVAL AND RIGHT URETERAL STENT PLACEMENT;  Surgeon: Ardis Hughs, MD;  Location: [facility];  Service: Urology;  Laterality: Right;   HERNIA REPAIR     HOLMIUM LASER APPLICATION N/A 0/12/6008   Procedure: HOLMIUM LASER APPLICATION;  Surgeon: Ardis Hughs, MD;  Location: [facility];  Service: Urology;  Laterality: N/A;   LAPAROSCOPIC CHOLECYSTECTOMY  10-07-2000   LIPOMA EXCISION  01/2017   from neck   UMBILICAL HERNIA REPAIR  age 45   UPPER GI ENDOSCOPY      Vitals:   09/03/21 1405  Pulse: 75  SpO2: 98%     Subjective Assessment - 09/03/21 1402     Subjective I have a headache and I am doing the exercises and just feel the burn.  Pertinent History psychogenic tremor,anxiety, convergence syndrome (patient reports this can last for 5 minutes- months, stating it looks like she is having stroke); see extensive PMH above    Currently in Pain? No/denies               [facility] Adult PT Treatment/Exercise:   Therapeutic Exercise: - NuStep level 5 x 6 minutes (SPO2 99%, HR 85 bpm - resisted hamstring curl 2 x 10 @ 20 lbs - resisted knee extension 2 x 10 @ 10 lbs - forward step ups 6 inch 1 x 10  - lateral band walks at counter 3 sets down/back green band at shins -standing hip extension with green band 10 x 2- sink stretch for lumbar in between sets -gait 185 ft x 2 , counting backwards from 30 - step ups- 30 sec x 2 HR 94 , SPO2 97% -seated hamstring stretch 60 sec each  - sit to stand x 20 with 5 lb kettlebell      Not performed today*  - Standing lumbar extension 1 x 10   - prone quad stretch 60 sec each - standing march 2 x 10  - standing hip abduction 2 x 10  - standing hip extension 2 x 10        PT Short Term Goals - 08/23/21 1411       PT SHORT TERM GOAL #1   Title Patient will be independent with initial HEP.    Baseline issued at eval.    Status New    Target Date 09/06/21      PT SHORT TERM GOAL #2   Title Patient will complete 5 x STS in less than 12 seconds to signify improvements in functional strength.    Baseline see flowsheet    Status New    Target Date 09/20/21      PT SHORT TERM GOAL #3   Title Patient will complete 6 MWT reporting </= 12/20 on BORG RPE to signify improved tolerance to aerobic activity.    Baseline 15/20    Status New    Target Date 09/20/21               PT Long Term Goals - 08/23/21 1416       PT LONG TERM GOAL #1   Title Patient will be able to complete 15 reps of step ups from 8 inch step to signify ability to complete stair negotation at home.    Baseline unable to go up and down stairs at home    Status New    Target Date 10/18/21      PT LONG TERM GOAL #2   Title Patient will demonstrate at least 4+/5 strength in bilateral assessed hip musculature to improve stability about the chain with prolonged walking.    Baseline see flowsheet    Status New    Target Date 10/18/21      PT LONG TERM GOAL #3   Title Patient will report pain at worst as </= 6/10 to signify improvements in her overall condition    Baseline at worst 10/10 with prolonged standing and walking    Status New    Target Date 10/18/21      PT LONG TERM GOAL #4   Title Patient will tolerate at least 20 minutes of continous standing activity during PT session to signify improved tolerance to household activity.    Baseline tolerates 5 minutes of standing/walking activity.    Status New    Target Date  10/18/21      PT LONG TERM GOAL #5   Title Patient will score at least 56% function on FOTO to signify clinically  meaningul improvement in functional abilities.    Baseline 48%    Status New    Target Date 10/18/21                   Plan - 09/03/21 1440     Clinical Impression Statement Pt reports a headache today and no other pain. Continued with hip and knee strengthening. Pt once again performed step ups but did not have to count her steps and did not have anxiety. She was able to walk and count backwards without difficulty. Other counting tasks with stretches and therex were not challenging for patient. She tolerated session well with 6/10 dyspnea during 20 reps of sit- stand.    PT Treatment/Interventions ADLs/Self Care Home Management;Aquatic Therapy;Cryotherapy;Moist Heat;Electrical Stimulation;Gait training;Stair training;Functional mobility training;Therapeutic activities;Therapeutic exercise;Balance training;Neuromuscular re-education;Patient/family education;Manual techniques;Passive range of motion;Dry needling;Taping    PT Next Visit Plan hip and knee strengthening, core stabilization, dual tasking as able (walking with cognitive tasks)    PT Home Exercise Plan Access Code [email or web address]             Patient will benefit from skilled therapeutic intervention in order to improve the following deficits and impairments:  Difficulty walking, Obesity, Decreased endurance, Decreased activity tolerance, Pain, Impaired flexibility, Decreased balance, Postural dysfunction, Decreased strength, Decreased mobility  Visit Diagnosis: Muscle weakness (generalized)  Difficulty in walking, not elsewhere classified  COVID-19 long hauler manifesting chronic decreased mobility and endurance  Pain in both lower extremities     Problem List Patient Active Problem List   Diagnosis Date Noted   Physical deconditioning 08/22/2021   History of COVID-19 01/04/2021   Adult onset stuttering 06/30/2020   Migraine 06/30/2020   Fibromyalgia 06/30/2020   Type 2 diabetes mellitus without complication  ([place]) 18/29/9371   TIA (transient ischemic attack) 06/30/2020   Atypical chest pain 08/13/2018   Depression, major 03/17/2018   Chronic pain syndrome 03/17/2018   Expressive aphasia 11/15/2016   Cough 11/08/2015   B12 deficiency 12/07/2014   Wart viral 11/07/2014   Coarse tremors 02/09/2014   OSA on CPAP 01/28/2014   Psychogenic movement disorder 01/14/2014   Dyspnea 01/12/2014   Tremor of face and hands 01/12/2014   Breast pain in female 02/09/2013   Elevated WBC count 10/12/2012   Abdominal pain, other specified site 10/09/2012   Abdominal bloating 10/09/2012   Ovarian cystic mass 05/25/2012   Shoulder pain, right 03/11/2012   Right arm pain 12/30/2011   Acute sinusitis 07/30/2010   ACNE, ROSACEA 07/17/2010   LEG PAIN 07/17/2010   NECK PAIN 05/10/2010   ABDOMINAL PAIN, EPIGASTRIC 05/10/2010   BURSITIS, LEFT HIP 05/01/2010   PLANTAR FASCIITIS, BILATERAL 05/01/2010   HYPERGLYCEMIA 01/05/2010   PRURITUS 11/27/2009   TOBACCO USE, QUIT 11/27/2009   Anxiety state 11/22/2008   FATIGUE 11/22/2008   Essential hypertension 10/07/2008   Insomnia, unspecified 08/30/2008   Obesity 08/19/2008   NAUSEA ALONE 05/02/2008   VERTIGO 01/19/2008   DIARRHEA 01/19/2008   THYROID NODULE 06/23/2007   Dyslipidemia 06/23/2007   Situational depression 06/23/2007   ADD 06/23/2007   TMJ SYNDROME 06/23/2007   GERD 06/23/2007   Irritable bowel syndrome 06/23/2007   Headache 06/23/2007    Dorene Ar, PTA 09/03/2021, 2:47 PM  [facility] [address] [place], [place], [address] Phone: [phone]  Fax:  [phone]  Name: Paula [name] MRN: [id number] Date of Birth: June 12, 1961

## 2021-09-05 ENCOUNTER — Ambulatory Visit: Payer: Medicare HMO

## 2021-09-05 ENCOUNTER — Other Ambulatory Visit: Payer: Self-pay

## 2021-09-05 DIAGNOSIS — Z7409 Other reduced mobility: Secondary | ICD-10-CM | POA: Diagnosis not present

## 2021-09-05 DIAGNOSIS — U099 Post covid-19 condition, unspecified: Secondary | ICD-10-CM

## 2021-09-05 DIAGNOSIS — M6281 Muscle weakness (generalized): Secondary | ICD-10-CM | POA: Diagnosis not present

## 2021-09-05 DIAGNOSIS — R262 Difficulty in walking, not elsewhere classified: Secondary | ICD-10-CM

## 2021-09-05 DIAGNOSIS — M79605 Pain in left leg: Secondary | ICD-10-CM | POA: Diagnosis not present

## 2021-09-05 DIAGNOSIS — M79604 Pain in right leg: Secondary | ICD-10-CM | POA: Diagnosis not present

## 2021-09-05 NOTE — Patient Instructions (Signed)
Aquatic Therapy at [facility]-  What to Expect!  Where:   [facility] [address] [place], [place] [address] Rehab phone [phone]  NOTE:  You will receive an automated phone message reminding you of your appt and it will say the appointment is at the [address] [facility] [address] [facility] clinic.          How to Prepare: Please make sure you drink 8 ounces of water about one hour prior to your pool session A caregiver may attend if needed with the patient to help assist as needed. A caregiver can sit in the pool room on chair. Please arrive IN YOUR SUIT and 15 minutes prior to your appointment - this helps to avoid delays in starting your session. Please make sure to attend to any toileting needs prior to entering the pool Locker rooms for changing are provided.   There is direct access to the pool deck form the locker room.  You can lock your belongings in a locker with lock provided. Once on the pool deck your therapist will ask if you have signed the Patient  Consent and Assignment of Benefits form before beginning treatment Your therapist may take your blood pressure prior to, during and after your session if indicated We usually try and create a home exercise program based on activities we do in the pool.  Please be thinking about who might be able to assist you in the pool should you need to participate in an aquatic home exercise program at the time of discharge if you need assistance.  Some patients do not want to or do not have the ability to participate in an aquatic home program - this is not a barrier in any way to you participating in aquatic therapy as part of your current therapy plan! After Discharge from PT, you can continue using home program at  the [facility]/, there is a drop-in fee for $5 ($45 a month)or for 60 years  or older $4.00 ($40 a month for seniors ) or any local [employer] pool.  Memberships for purchase are  available for gym/pool at [facility]  IT IS VERY IMPORTANT THAT YOUR LAST VISIT BE IN THE CLINIC AT [address] AFTER YOUR LAST AQUATIC VISIT.  PLEASE MAKE SURE THAT YOU HAVE A [address]  APPOINTMENT SCHEDULED.   About the pool: Pool is located approximately 500 FT from the entrance of the building.  Please bring a support person if you need assistance traveling this      distance.   Your therapist will assist you in entering the water; there are two ways to           enter: stairs with railings, and a mechanical lift. Your therapist will determine the most appropriate way for you.  Water temperature is usually between 88-90 degrees  There may be up to 2 other swimmers in the pool at the same time  The pool deck is tile, please wear shoes with good traction if you prefer not to be barefoot.    Contact Info:  For appointment scheduling and cancellations:         Please call the [facility]  PH:[phone]              Aquatic Therapy  Outpatient [facility] @ [facility]       All sessions are 45 minutes                                                    

## 2021-09-05 NOTE — Therapy (Signed)
[place], [place], [address] Phone: [phone]   Fax:  [phone]  Physical Therapy Treatment  Patient Details  Name: Paula [name] MRN: [id number] Date of Birth: [birth year] Referring Provider (PT): Fenton Foy, NP   Encounter Date: 09/05/2021   PT End of Session - 09/05/21 1315     Visit Number 5    Number of Visits 17    Date for PT Re-Evaluation 10/18/21    Authorization Type [employer]    Authorization Time Period 9/22-11/18/22    Authorization - Visit Number 4    Authorization - Number of Visits 16    PT Start Time 5329    PT Stop Time 1400    PT Time Calculation (min) 45 min    Activity Tolerance Patient tolerated treatment well    Behavior During Therapy [facility] for tasks assessed/performed             Past Medical History:  Diagnosis Date   Anxiety    pt stated anxiety episodes resemble stroke symptoms   Chronic lower back pain    Conversion disorder    Depression    Diverticulitis    Expressive aphasia 11/15/2016   Fibromyalgia    GERD (gastroesophageal reflux disease)    Heart murmur    High cholesterol    History of gastric ulcer    History of hiatal hernia    History of kidney stones    History of thyroid nodule    Hyperlipidemia    Hypertension    IBS (irritable bowel syndrome)    Intermittent vertigo    Migraine    "a few times/year now; maybe" (11/15/2016)   Obesity    OSA on CPAP    wears CPAP nightly ;study in epic 02-01-2014 (11/15/2016)   Osteoarthritis    "knees, hands, back, hips" (11/15/2016)   Pneumonia X 1   hx of   Psychogenic tremor    intermittant head tremor-(11/15/2016)   Refusal of blood transfusions as patient is Jehovah's Witness    RLS (restless legs syndrome)    Sleep apnea    CPAP   Urinary incontinence    Vertigo     Past Surgical History:  Procedure Laterality Date   ABDOMINAL HYSTERECTOMY  1990   ANTERIOR CERVICAL  DECOMP/DISCECTOMY FUSION  02-10-2004   C4 -- C7   BACK SURGERY     CARDIOVASCULAR STRESS TEST  2017   Negative   CESAREAN SECTION  1977; [place] W/ RETROGRADES Right 06/07/2015   Procedure: CYSTOSCOPY WITH RETROGRADE PYELOGRAM;  Surgeon: Ardis Hughs, MD;  Location: [facility];  Service: Urology;  Laterality: Right;   CYSTOSCOPY WITH URETEROSCOPY AND STENT PLACEMENT Right 06/07/2015   Procedure: RIGHT URETEROSCOPY, LASER LITHOTRIPSY, STONE REMOVAL AND RIGHT URETERAL STENT PLACEMENT;  Surgeon: Ardis Hughs, MD;  [facility];  Service: Urology;  Laterality: Right;   HERNIA REPAIR     HOLMIUM LASER APPLICATION N/A 08/04/4267   Procedure: HOLMIUM LASER APPLICATION;  Surgeon: Ardis Hughs, MD;  Location: [facility];  Service: Urology;  Laterality: N/A;   LAPAROSCOPIC CHOLECYSTECTOMY  10-07-2000   LIPOMA EXCISION  01/2017   from neck   UMBILICAL HERNIA REPAIR  age 30   UPPER GI ENDOSCOPY      There were no vitals filed for this visit.   Subjective Assessment - 09/05/21 1317     Subjective Patient  reports her knees are achey. She has been doing her exercises and has been walking to/from the mailbox.    Pertinent History psychogenic tremor,anxiety, convergence syndrome (patient reports this can last for 5 minutes- months, stating it looks like she is having stroke); see extensive PMH above    Currently in Pain? Yes    Pain Score 8     Pain Location Knee    Pain Orientation Left;Right    Pain Descriptors / Indicators Aching    Pain Type Chronic pain    Pain Onset More than a month ago    Pain Frequency Intermittent    Aggravating Factors  standing/walking    Pain Relieving Factors rest                [facility] PT Assessment - 09/05/21 0001       Transfers   Five time sit to stand comments  15 seconds                [facility] Adult PT Treatment/Exercise:   Therapeutic Exercise: -  NuStep level 5 x 5 minutes  - prone quad stretch 60 sec each - Calf Raise 2 x 15  - Step taps on 8 inch step 30 second intervals x 5 spO2 94%, HR 98 - Bodyweight squats with UE support 2 x 10  - Lateral step ups 2 x 10 6 inch  - marching on airex 1 x 10      Not performed today*  - resisted hamstring curl 2 x 10 @ 20 lbs - resisted knee extension 2 x 10 @ 10 lbs - forward step ups 6 inch 1 x 10  - lateral band walks at counter 3 sets down/back green band at shins -standing hip extension with green band 10 x 2- sink stretch for lumbar in between sets -gait 185 ft x 2 , counting backwards from 30 - step ups- 30 sec x 2 - sit to stand x 20 with 5 lb kettlebell  -seated hamstring stretch 60 sec each  - Standing lumbar extension 1 x 10  - standing march 2 x 10  - standing hip abduction 2 x 10         Self-Care Information provided regarding aquatic PT                     PT Short Term Goals - 09/05/21 1327       PT SHORT TERM GOAL #1   Title Patient will be independent with initial HEP.    Baseline issued at eval.    Status Achieved    Target Date 09/06/21      PT SHORT TERM GOAL #2   Title Patient will complete 5 x STS in less than 12 seconds to signify improvements in functional strength.    Baseline see flowsheet    Status On-going    Target Date 09/20/21      PT SHORT TERM GOAL #3   Title Patient will complete 6 MWT reporting </= 12/20 on BORG RPE to signify improved tolerance to aerobic activity.    Baseline 15/20    Status Deferred    Target Date 09/20/21               PT Long Term Goals - 08/23/21 1416       PT LONG TERM GOAL #1   Title Patient will be able to complete 15 reps of step ups from 8 inch step to signify ability to  complete stair negotation at home.    Baseline unable to go up and down stairs at home    Status New    Target Date 10/18/21      PT LONG TERM GOAL #2   Title Patient will demonstrate at least 4+/5 strength  in bilateral assessed hip musculature to improve stability about the chain with prolonged walking.    Baseline see flowsheet    Status New    Target Date 10/18/21      PT LONG TERM GOAL #3   Title Patient will report pain at worst as </= 6/10 to signify improvements in her overall condition    Baseline at worst 10/10 with prolonged standing and walking    Status New    Target Date 10/18/21      PT LONG TERM GOAL #4   Title Patient will tolerate at least 20 minutes of continous standing activity during PT session to signify improved tolerance to household activity.    Baseline tolerates 5 minutes of standing/walking activity.    Status New    Target Date 10/18/21      PT LONG TERM GOAL #5   Title Patient will score at least 56% function on FOTO to signify clinically meaningul improvement in functional abilities.    Baseline 48%    Status New    Target Date 10/18/21                   Plan - 09/05/21 1336     Clinical Impression Statement Patient arrives with reports of bilateral knee pain. Able to further progress BLE strength and endurance without increased pain though patient reported occasional hamstring cramping. She reported feeling nervous with lateral step ups due to feeling "wobbly," though she demonstrates good postural control with no LOB when performing exercise. Introduced standing activity on unstable surface with patient reporting that this initially made her very nervous to attempt due to fear of falling, however she was able to complete marching on airex without LOB and good control. Overall good tolerance to today's session without reports of increased pain, reporting muscle fatigue at conclusion of session.    PT Treatment/Interventions ADLs/Self Care Home Management;Aquatic Therapy;Cryotherapy;Moist Heat;Electrical Stimulation;Gait training;Stair training;Functional mobility training;Therapeutic activities;Therapeutic exercise;Balance training;Neuromuscular  re-education;Patient/family education;Manual techniques;Passive range of motion;Dry needling;Taping    PT Next Visit Plan hip and knee strengthening, core stabilization, dual tasking as able (walking with cognitive tasks)    PT Home Exercise Plan Access Code [email or web address]             Patient will benefit from skilled therapeutic intervention in order to improve the following deficits and impairments:  Difficulty walking, Obesity, Decreased endurance, Decreased activity tolerance, Pain, Impaired flexibility, Decreased balance, Postural dysfunction, Decreased strength, Decreased mobility  Visit Diagnosis: Muscle weakness (generalized)  Difficulty in walking, not elsewhere classified  COVID-19 long hauler manifesting chronic decreased mobility and endurance  Pain in both lower extremities     Problem List Patient Active Problem List   Diagnosis Date Noted   Physical deconditioning 08/22/2021   History of COVID-19 01/04/2021   Adult onset stuttering 06/30/2020   Migraine 06/30/2020   Fibromyalgia 06/30/2020   Type 2 diabetes mellitus without complication ([place]) 49/44/9675   TIA (transient ischemic attack) 06/30/2020   Atypical chest pain 08/13/2018   Depression, major 03/17/2018   Chronic pain syndrome 03/17/2018   Expressive aphasia 11/15/2016   Cough 11/08/2015   B12 deficiency 12/07/2014   Wart viral 11/07/2014   Coarse tremors 02/09/2014  OSA on CPAP 01/28/2014   Psychogenic movement disorder 01/14/2014   Dyspnea 01/12/2014   Tremor of face and hands 01/12/2014   Breast pain in female 02/09/2013   Elevated WBC count 10/12/2012   Abdominal pain, other specified site 10/09/2012   Abdominal bloating 10/09/2012   Ovarian cystic mass 05/25/2012   Shoulder pain, right 03/11/2012   Right arm pain 12/30/2011   Acute sinusitis 07/30/2010   ACNE, ROSACEA 07/17/2010   LEG PAIN 07/17/2010   NECK PAIN 05/10/2010   ABDOMINAL PAIN, EPIGASTRIC 05/10/2010   BURSITIS, LEFT HIP  05/01/2010   PLANTAR FASCIITIS, BILATERAL 05/01/2010   HYPERGLYCEMIA 01/05/2010   PRURITUS 11/27/2009   TOBACCO USE, QUIT 11/27/2009   Anxiety state 11/22/2008   FATIGUE 11/22/2008   Essential hypertension 10/07/2008   Insomnia, unspecified 08/30/2008   Obesity 08/19/2008   NAUSEA ALONE 05/02/2008   VERTIGO 01/19/2008   DIARRHEA 01/19/2008   THYROID NODULE 06/23/2007   Dyslipidemia 06/23/2007   Situational depression 06/23/2007   ADD 06/23/2007   TMJ SYNDROME 06/23/2007   GERD 06/23/2007   Irritable bowel syndrome 06/23/2007   Headache 06/23/2007   Gwendolyn Grant, PT, DPT, ATC 09/05/21 2:07 PM  [facility] [address] [place], [place], [address] Phone: [phone]   Fax:  [phone]  Name: DARLYNN RICCO MRN: [id number] Date of Birth: 07-23-1961

## 2021-09-06 ENCOUNTER — Ambulatory Visit (HOSPITAL_COMMUNITY)
Admission: RE | Admit: 2021-09-06 | Discharge: 2021-09-06 | Disposition: A | Discharge status: Home / Self Care | Payer: Medicare HMO | Source: Ambulatory Visit | Attending: Internal Medicine | Admitting: Internal Medicine

## 2021-09-06 DIAGNOSIS — R6881 Early satiety: Secondary | ICD-10-CM | POA: Diagnosis not present

## 2021-09-06 DIAGNOSIS — K219 Gastro-esophageal reflux disease without esophagitis: Secondary | ICD-10-CM | POA: Insufficient documentation

## 2021-09-06 DIAGNOSIS — R1013 Epigastric pain: Secondary | ICD-10-CM | POA: Insufficient documentation

## 2021-09-06 MED ORDER — TECHNETIUM TC 99M SULFUR COLLOID
2.2000 | Freq: Once | INTRAVENOUS | Status: AC
  Administered 2021-09-06: 2.2 via ORAL

## 2021-09-07 ENCOUNTER — Telehealth: Payer: Self-pay | Admitting: Internal Medicine

## 2021-09-07 NOTE — Telephone Encounter (Signed)
IBgard.  1 before meals

## 2021-09-07 NOTE — Telephone Encounter (Signed)
Spoke with pt and she is aware.

## 2021-09-07 NOTE — Telephone Encounter (Signed)
Inbound call from patient. States the emptying test made her stomach feel uneasy. Would like recommendations on what can help with her stomach

## 2021-09-07 NOTE — Telephone Encounter (Signed)
Pt states since her GES  yesterday her stomach has been hurting and cramping. Reports she is also having nausea. Pt wanting to know if there is something Dr. Henrene Pastor can prescribe to help her stomach "calm down." States she was not having the cramping until she did the GES and ate the egg yesterday. Please advise.

## 2021-09-10 ENCOUNTER — Encounter (HOSPITAL_BASED_OUTPATIENT_CLINIC_OR_DEPARTMENT_OTHER): Payer: Self-pay | Admitting: Physical Therapy

## 2021-09-10 ENCOUNTER — Ambulatory Visit: Payer: Medicare HMO | Admitting: Internal Medicine

## 2021-09-10 ENCOUNTER — Ambulatory Visit (HOSPITAL_BASED_OUTPATIENT_CLINIC_OR_DEPARTMENT_OTHER): Payer: Medicare HMO | Attending: Nurse Practitioner | Admitting: Physical Therapy

## 2021-09-10 ENCOUNTER — Encounter: Payer: Medicare HMO | Admitting: Physical Therapy

## 2021-09-10 ENCOUNTER — Other Ambulatory Visit: Payer: Self-pay

## 2021-09-10 DIAGNOSIS — Z8616 Personal history of COVID-19: Secondary | ICD-10-CM | POA: Insufficient documentation

## 2021-09-10 DIAGNOSIS — R5381 Other malaise: Secondary | ICD-10-CM | POA: Diagnosis not present

## 2021-09-10 DIAGNOSIS — M6281 Muscle weakness (generalized): Secondary | ICD-10-CM

## 2021-09-10 DIAGNOSIS — Z7409 Other reduced mobility: Secondary | ICD-10-CM

## 2021-09-10 DIAGNOSIS — M79604 Pain in right leg: Secondary | ICD-10-CM | POA: Diagnosis not present

## 2021-09-10 DIAGNOSIS — R262 Difficulty in walking, not elsewhere classified: Secondary | ICD-10-CM

## 2021-09-10 DIAGNOSIS — M79605 Pain in left leg: Secondary | ICD-10-CM | POA: Insufficient documentation

## 2021-09-10 NOTE — Therapy (Signed)
[facility] [address] [place], [place], [address] Phone: [phone]   Fax:  [phone]  Physical Therapy Treatment  Patient Details  Name: Paula [name] MRN: [id number] Date of Birth: [birth year] Referring Provider (PT): Fenton Foy, NP   Encounter Date: 09/10/2021   PT End of Session - 09/10/21 1120     Visit Number 6    Number of Visits 17    Date for PT Re-Evaluation 10/18/21    Authorization Type [employer]    Authorization Time Period 9/22-11/18/22    Authorization - Visit Number 4    Authorization - Number of Visits 16    PT Start Time 1118    PT Stop Time 1200    PT Time Calculation (min) 42 min    Activity Tolerance Patient tolerated treatment well    Behavior During Therapy [facility] for tasks assessed/performed             Past Medical History:  Diagnosis Date   Anxiety    pt stated anxiety episodes resemble stroke symptoms   Chronic lower back pain    Conversion disorder    Depression    Diverticulitis    Expressive aphasia 11/15/2016   Fibromyalgia    GERD (gastroesophageal reflux disease)    Heart murmur    High cholesterol    History of gastric ulcer    History of hiatal hernia    History of kidney stones    History of thyroid nodule    Hyperlipidemia    Hypertension    IBS (irritable bowel syndrome)    Intermittent vertigo    Migraine    "a few times/year now; maybe" (11/15/2016)   Obesity    OSA on CPAP    wears CPAP nightly ;study in epic 02-01-2014 (11/15/2016)   Osteoarthritis    "knees, hands, back, hips" (11/15/2016)   Pneumonia X 1   hx of   Psychogenic tremor    intermittant head tremor-(11/15/2016)   Refusal of blood transfusions as patient is Jehovah's Witness    RLS (restless legs syndrome)    Sleep apnea    CPAP   Urinary incontinence    Vertigo     Past Surgical History:  Procedure Laterality Date   ABDOMINAL HYSTERECTOMY  1990   ANTERIOR CERVICAL  DECOMP/DISCECTOMY FUSION  02-10-2004   C4 -- C7   BACK SURGERY     CARDIOVASCULAR STRESS TEST  2017   Negative   CESAREAN SECTION  1977; [place] W/ RETROGRADES Right 06/07/2015   Procedure: CYSTOSCOPY WITH RETROGRADE PYELOGRAM;  Surgeon: Ardis Hughs, MD;  Location: [facility];  Service: Urology;  Laterality: Right;   CYSTOSCOPY WITH URETEROSCOPY AND STENT PLACEMENT Right 06/07/2015   Procedure: RIGHT URETEROSCOPY, LASER LITHOTRIPSY, STONE REMOVAL AND RIGHT URETERAL STENT PLACEMENT;  Surgeon: Ardis Hughs, MD;  Location: [facility];  Service: Urology;  Laterality: Right;   HERNIA REPAIR     HOLMIUM LASER APPLICATION N/A 2/0/2542   Procedure: HOLMIUM LASER APPLICATION;  Surgeon: Ardis Hughs, MD;  Location: [facility];  Service: Urology;  Laterality: N/A;   LAPAROSCOPIC CHOLECYSTECTOMY  10-07-2000   LIPOMA EXCISION  01/2017   from neck   UMBILICAL HERNIA REPAIR  age 26   UPPER GI ENDOSCOPY      There were no vitals filed for this visit.   Subjective Assessment - 09/10/21 0817     Subjective "I  am so happy to be back in the water, I go the the [facility], this water is warmer and feels better"                                          PT Short Term Goals - 09/05/21 1327       PT SHORT TERM GOAL #1   Title Patient will be independent with initial HEP.    Baseline issued at eval.    Status Achieved    Target Date 09/06/21      PT SHORT TERM GOAL #2   Title Patient will complete 5 x STS in less than 12 seconds to signify improvements in functional strength.    Baseline see flowsheet    Status On-going    Target Date 09/20/21      PT SHORT TERM GOAL #3   Title Patient will complete 6 MWT reporting </= 12/20 on BORG RPE to signify improved tolerance to aerobic activity.    Baseline 15/20    Status Deferred    Target Date 09/20/21               PT Long Term  Goals - 08/23/21 1416       PT LONG TERM GOAL #1   Title Patient will be able to complete 15 reps of step ups from 8 inch step to signify ability to complete stair negotation at home.    Baseline unable to go up and down stairs at home    Status New    Target Date 10/18/21      PT LONG TERM GOAL #2   Title Patient will demonstrate at least 4+/5 strength in bilateral assessed hip musculature to improve stability about the chain with prolonged walking.    Baseline see flowsheet    Status New    Target Date 10/18/21      PT LONG TERM GOAL #3   Title Patient will report pain at worst as </= 6/10 to signify improvements in her overall condition    Baseline at worst 10/10 with prolonged standing and walking    Status New    Target Date 10/18/21      PT LONG TERM GOAL #4   Title Patient will tolerate at least 20 minutes of continous standing activity during PT session to signify improved tolerance to household activity.    Baseline tolerates 5 minutes of standing/walking activity.    Status New    Target Date 10/18/21      PT LONG TERM GOAL #5   Title Patient will score at least 56% function on FOTO to signify clinically meaningul improvement in functional abilities.    Baseline 48%    Status New    Target Date 10/18/21           Pt seen for aquatic therapy today.  Treatment took place in water 3.25-4.8 ft in depth at the [facility] pool. Temp of water was 91.  Pt entered/exited the pool via stairs step to pattern independently with bilat rail.  Pt introduced to setting Warm up: forward, backward and side stepping/walking cues for increased step length, increased speed, hand placement to increase resistance.  Seated Stretching: gastroc and hamstrings 4 x 20 second hold  Standing Strengthening: -Step ups forward R/L x 10 with vc and demonstration -Side stepping R/L x 10 VC and demonstration -[employer] push downs  2 x 15 with manual perturbations -Kick board  rotations x 10 R/L VC and demonstration -Noodle kickdowns R/L 2 x 10 reps hip neutral and ER  Water walking (lunges) forward and backward for recovery/rest and stretching 2 widths between most exercises.   Pt requires buoyancy for support and to offload joints with strengthening exercises. Viscosity of the water is needed for resistance of strengthening; water current perturbations provides challenge to standing balance unsupported, requiring increased core activation.         Plan - 09/10/21 1201     Clinical Impression Statement Knee and lb discomfort as she presents today. Added to HEP (land PT please give her a copy). Pt introduced to setting and instructed on water principles/ benefits of water therapy.  She acclimates quickly. She completes all prescribed exercises with some le discomofrt but tolerable.  Reports LB feeling better submerged > 50%. She will benefit from aquatics to be an adjunct to land therapy as pt able to exercise with less discomofrt in setting anticipating improved outcomes.    Stability/Clinical Decision Making Evolving/Moderate complexity    Clinical Decision Making Moderate    Rehab Potential Good    PT Frequency 2x / week    PT Duration 8 weeks    PT Treatment/Interventions ADLs/Self Care Home Management;Aquatic Therapy;Cryotherapy;Moist Heat;Electrical Stimulation;Gait training;Stair training;Functional mobility training;Therapeutic activities;Therapeutic exercise;Balance training;Neuromuscular re-education;Patient/family education;Manual techniques;Passive range of motion;Dry needling;Taping    PT Next Visit Plan hip and knee strengthening, core stabilization, dual tasking as able (walking with cognitive tasks)    PT Home Exercise Plan Access Code [email or web address]             Patient will benefit from skilled therapeutic intervention in order to improve the following deficits and impairments:  Difficulty walking, Obesity, Decreased endurance, Decreased activity  tolerance, Pain, Impaired flexibility, Decreased balance, Postural dysfunction, Decreased strength, Decreased mobility  Visit Diagnosis: Muscle weakness (generalized)  Difficulty in walking, not elsewhere classified  COVID-19 long hauler manifesting chronic decreased mobility and endurance  Pain in both lower extremities     Problem List Patient Active Problem List   Diagnosis Date Noted   Physical deconditioning 08/22/2021   History of COVID-19 01/04/2021   Adult onset stuttering 06/30/2020   Migraine 06/30/2020   Fibromyalgia 06/30/2020   Type 2 diabetes mellitus without complication ([place]) 35/59/7416   TIA (transient ischemic attack) 06/30/2020   Atypical chest pain 08/13/2018   Depression, major 03/17/2018   Chronic pain syndrome 03/17/2018   Expressive aphasia 11/15/2016   Cough 11/08/2015   B12 deficiency 12/07/2014   Wart viral 11/07/2014   Coarse tremors 02/09/2014   OSA on CPAP 01/28/2014   Psychogenic movement disorder 01/14/2014   Dyspnea 01/12/2014   Tremor of face and hands 01/12/2014   Breast pain in female 02/09/2013   Elevated WBC count 10/12/2012   Abdominal pain, other specified site 10/09/2012   Abdominal bloating 10/09/2012   Ovarian cystic mass 05/25/2012   Shoulder pain, right 03/11/2012   Right arm pain 12/30/2011   Acute sinusitis 07/30/2010   ACNE, ROSACEA 07/17/2010   LEG PAIN 07/17/2010   NECK PAIN 05/10/2010   ABDOMINAL PAIN, EPIGASTRIC 05/10/2010   BURSITIS, LEFT HIP 05/01/2010   PLANTAR FASCIITIS, BILATERAL 05/01/2010   HYPERGLYCEMIA 01/05/2010   PRURITUS 11/27/2009   TOBACCO USE, QUIT 11/27/2009   Anxiety state 11/22/2008   FATIGUE 11/22/2008   Essential hypertension 10/07/2008   Insomnia, unspecified 08/30/2008   Obesity 08/19/2008   NAUSEA ALONE 05/02/2008   VERTIGO 01/19/2008  DIARRHEA 01/19/2008   THYROID NODULE 06/23/2007   Dyslipidemia 06/23/2007   Situational depression 06/23/2007   ADD 06/23/2007   TMJ SYNDROME  06/23/2007   GERD 06/23/2007   Irritable bowel syndrome 06/23/2007   Headache 06/23/2007    Annamarie Major) Roselani Grajeda MPT  09/11/2021, 8:18 AM  [facility] [address] [place], [place], [address] Phone: [phone]   Fax:  [phone]  Name: Paula [name] MRN: [id number] Date of Birth: 1961-01-16

## 2021-09-12 ENCOUNTER — Ambulatory Visit: Payer: Medicare HMO

## 2021-09-12 ENCOUNTER — Other Ambulatory Visit: Payer: Self-pay

## 2021-09-13 ENCOUNTER — Emergency Department (HOSPITAL_COMMUNITY)
Admission: EM | Admit: 2021-09-13 | Discharge: 2021-09-14 | Discharge status: Against Medical Advice | Payer: Medicare HMO

## 2021-09-13 ENCOUNTER — Other Ambulatory Visit: Payer: Self-pay

## 2021-09-13 NOTE — ED Notes (Signed)
Called to update vital signs no answer.

## 2021-09-14 ENCOUNTER — Encounter (HOSPITAL_BASED_OUTPATIENT_CLINIC_OR_DEPARTMENT_OTHER): Payer: Self-pay | Admitting: *Deleted

## 2021-09-14 ENCOUNTER — Other Ambulatory Visit: Payer: Self-pay

## 2021-09-14 ENCOUNTER — Emergency Department (HOSPITAL_BASED_OUTPATIENT_CLINIC_OR_DEPARTMENT_OTHER)
Admission: EM | Admit: 2021-09-14 | Discharge: 2021-09-14 | Disposition: A | Discharge status: Home / Self Care | Payer: Medicare HMO | Attending: Emergency Medicine | Admitting: Emergency Medicine

## 2021-09-14 ENCOUNTER — Emergency Department (HOSPITAL_BASED_OUTPATIENT_CLINIC_OR_DEPARTMENT_OTHER): Payer: Medicare HMO

## 2021-09-14 DIAGNOSIS — E119 Type 2 diabetes mellitus without complications: Secondary | ICD-10-CM | POA: Insufficient documentation

## 2021-09-14 DIAGNOSIS — R197 Diarrhea, unspecified: Secondary | ICD-10-CM | POA: Insufficient documentation

## 2021-09-14 DIAGNOSIS — I10 Essential (primary) hypertension: Secondary | ICD-10-CM | POA: Diagnosis not present

## 2021-09-14 DIAGNOSIS — I700 Atherosclerosis of aorta: Secondary | ICD-10-CM | POA: Diagnosis not present

## 2021-09-14 DIAGNOSIS — Z87891 Personal history of nicotine dependence: Secondary | ICD-10-CM | POA: Insufficient documentation

## 2021-09-14 DIAGNOSIS — K219 Gastro-esophageal reflux disease without esophagitis: Secondary | ICD-10-CM | POA: Diagnosis not present

## 2021-09-14 DIAGNOSIS — R1013 Epigastric pain: Secondary | ICD-10-CM | POA: Diagnosis not present

## 2021-09-14 DIAGNOSIS — Z8616 Personal history of COVID-19: Secondary | ICD-10-CM | POA: Insufficient documentation

## 2021-09-14 DIAGNOSIS — K5790 Diverticulosis of intestine, part unspecified, without perforation or abscess without bleeding: Secondary | ICD-10-CM | POA: Diagnosis not present

## 2021-09-14 DIAGNOSIS — R110 Nausea: Secondary | ICD-10-CM | POA: Diagnosis not present

## 2021-09-14 DIAGNOSIS — K760 Fatty (change of) liver, not elsewhere classified: Secondary | ICD-10-CM | POA: Diagnosis not present

## 2021-09-14 DIAGNOSIS — D72829 Elevated white blood cell count, unspecified: Secondary | ICD-10-CM | POA: Diagnosis not present

## 2021-09-14 DIAGNOSIS — N3289 Other specified disorders of bladder: Secondary | ICD-10-CM | POA: Diagnosis not present

## 2021-09-14 LAB — TROPONIN I (HIGH SENSITIVITY): Troponin I (High Sensitivity): 5 ng/L (ref <18)

## 2021-09-14 LAB — COMPREHENSIVE METABOLIC PANEL
ALT: 40 U/L (ref 0–44)
AST: 32 U/L (ref 15–41)
Albumin: 4.7 g/dL (ref 3.5–5.0)
Alkaline Phosphatase: 72 U/L (ref 38–126)
Anion gap: 9 (ref 5–15)
BUN: 14 mg/dL (ref 8–23)
CO2: 25 mmol/L (ref 22–32)
Calcium: 10 mg/dL (ref 8.9–10.3)
Chloride: 106 mmol/L (ref 98–111)
Creatinine, Ser: 0.7 mg/dL (ref 0.44–1.00)
GFR, Estimated: >60 mL/min (ref >60)
Glucose, Bld: 110 mg/dL — ABNORMAL HIGH (ref 70–99)
Potassium: 3.6 mmol/L (ref 3.5–5.1)
Sodium: 140 mmol/L (ref 135–145)
Total Bilirubin: 0.2 mg/dL — ABNORMAL LOW (ref 0.3–1.2)
Total Protein: 7.8 g/dL (ref 6.5–8.1)

## 2021-09-14 LAB — URINALYSIS, ROUTINE W REFLEX MICROSCOPIC
Bilirubin Urine: NEGATIVE
Glucose, UA: NEGATIVE mg/dL
Ketones, ur: NEGATIVE mg/dL
Leukocytes,Ua: NEGATIVE
Nitrite: NEGATIVE
Protein, ur: NEGATIVE mg/dL
Specific Gravity, Urine: >1.03 — ABNORMAL HIGH (ref 1.005–1.030)
pH: 5.5 (ref 5.0–8.0)

## 2021-09-14 LAB — CBC
HCT: 39.6 % (ref 36.0–46.0)
Hemoglobin: 13.2 g/dL (ref 12.0–15.0)
MCH: 29.4 pg (ref 26.0–34.0)
MCHC: 33.3 g/dL (ref 30.0–36.0)
MCV: 88.2 fL (ref 80.0–100.0)
Platelets: 372 10*3/uL (ref 150–400)
RBC: 4.49 MIL/uL (ref 3.87–5.11)
RDW: 12.1 % (ref 11.5–15.5)
WBC: 10.7 10*3/uL — ABNORMAL HIGH (ref 4.0–10.5)
nRBC: 0 % (ref 0.0–0.2)

## 2021-09-14 LAB — URINALYSIS, MICROSCOPIC (REFLEX)

## 2021-09-14 LAB — LIPASE, BLOOD: Lipase: 29 U/L (ref 11–51)

## 2021-09-14 MED ORDER — DICYCLOMINE HCL 10 MG PO CAPS
10.0000 mg | ORAL_CAPSULE | Freq: Once | ORAL | Status: AC
  Administered 2021-09-14: 10 mg via ORAL
  Filled 2021-09-14: qty 1

## 2021-09-14 MED ORDER — ALUM & MAG HYDROXIDE-SIMETH 200-200-20 MG/5ML PO SUSP
30.0000 mL | Freq: Once | ORAL | Status: AC
  Administered 2021-09-14: 30 mL via ORAL
  Filled 2021-09-14: qty 30

## 2021-09-14 MED ORDER — ONDANSETRON HCL 4 MG/2ML IJ SOLN
4.0000 mg | Freq: Once | INTRAMUSCULAR | Status: DC
  Filled 2021-09-14: qty 2

## 2021-09-14 MED ORDER — MORPHINE SULFATE (PF) 4 MG/ML IV SOLN
4.0000 mg | Freq: Once | INTRAVENOUS | Status: AC
Start: 2021-09-14 — End: 2021-09-14
  Administered 2021-09-14: 4 mg via INTRAMUSCULAR
  Filled 2021-09-14: qty 1

## 2021-09-14 MED ORDER — SUCRALFATE 1 G PO TABS: 1.0000 g | ORAL_TABLET | Freq: Three times a day (TID) | ORAL | 0 refills | Status: DC

## 2021-09-14 MED ORDER — LIDOCAINE VISCOUS HCL 2 % MT SOLN
15.0000 mL | Freq: Once | OROMUCOSAL | Status: AC
  Administered 2021-09-14: 15 mL via ORAL
  Filled 2021-09-14: qty 15

## 2021-09-14 MED ORDER — MORPHINE SULFATE (PF) 4 MG/ML IV SOLN
4.0000 mg | Freq: Once | INTRAVENOUS | Status: DC
Start: 2021-09-14 — End: 2021-09-14
  Filled 2021-09-14: qty 1

## 2021-09-14 MED ORDER — ONDANSETRON 4 MG PO TBDP
4.0000 mg | ORAL_TABLET | Freq: Once | ORAL | Status: AC
  Administered 2021-09-14: 4 mg via ORAL
  Filled 2021-09-14: qty 1

## 2021-09-14 MED ORDER — SODIUM CHLORIDE 0.9 % IV BOLUS: 500.0000 mL | Freq: Once | INTRAVENOUS | Status: DC

## 2021-09-14 NOTE — ED Provider Notes (Addendum)
[facility] EMERGENCY DEPARTMENT Provider Note   CSN: [id number] Arrival date & time: 09/14/21  1725     History Chief Complaint  Patient presents with   Abdominal Pain    Paula [name] is a 60 y.o. female with past medical history significant for GERD, fibromyalgias, gastric ulcer, IBS who presents for evaluation of abdominal pain.  Pain began 2 days ago.  Located epigastric region, left upper quadrant.  No chest pain, shortness of breath.  She feels like her abdomen is distended.  Pain constant however intermittently worsens. Pain worse with p.o. intake. She does not have a gallbladder.  She had a gastric emptying study with [facility] >1 week ago without any findings per patient. States she was told she needed to start on probiotics. Upper endo and colonoscopy in June.  Also having some loose stools.  No melena or blood per rectum.  No chronic NSAID use, EtOH use.  Denies any aggravating or alleviating factors.  No urinary complaints.  No fever, chills, emesis, flank pain, new numbness or weakness.  History obtained from patient and past medical records.  No interpreter used.  HPI     Past Medical History:  Diagnosis Date   Anxiety    pt stated anxiety episodes resemble stroke symptoms   Chronic lower back pain    Conversion disorder    Depression    Diverticulitis    Expressive aphasia 11/15/2016   Fibromyalgia    GERD (gastroesophageal reflux disease)    Heart murmur    High cholesterol    History of gastric ulcer    History of hiatal hernia    History of kidney stones    History of thyroid nodule    Hyperlipidemia    Hypertension    IBS (irritable bowel syndrome)    Intermittent vertigo    Migraine    "a few times/year now; maybe" (11/15/2016)   Obesity    OSA on CPAP    wears CPAP nightly ;study in epic 02-01-2014 (11/15/2016)   Osteoarthritis    "knees, hands, back, hips" (11/15/2016)   Pneumonia X 1   hx of   Psychogenic tremor     intermittant head tremor-(11/15/2016)   Refusal of blood transfusions as patient is Jehovah's Witness    RLS (restless legs syndrome)    Sleep apnea    CPAP   Urinary incontinence    Vertigo     Patient Active Problem List   Diagnosis Date Noted   Physical deconditioning 08/22/2021   History of COVID-19 01/04/2021   Adult onset stuttering 06/30/2020   Migraine 06/30/2020   Fibromyalgia 06/30/2020   Type 2 diabetes mellitus without complication ([place]) 05/31/1600   TIA (transient ischemic attack) 06/30/2020   Atypical chest pain 08/13/2018   Depression, major 03/17/2018   Chronic pain syndrome 03/17/2018   Expressive aphasia 11/15/2016   Cough 11/08/2015   B12 deficiency 12/07/2014   Wart viral 11/07/2014   Coarse tremors 02/09/2014   OSA on CPAP 01/28/2014   Psychogenic movement disorder 01/14/2014   Dyspnea 01/12/2014   Tremor of face and hands 01/12/2014   Breast pain in female 02/09/2013   Elevated WBC count 10/12/2012   Abdominal pain, other specified site 10/09/2012   Abdominal bloating 10/09/2012   Ovarian cystic mass 05/25/2012   Shoulder pain, right 03/11/2012   Right arm pain 12/30/2011   Acute sinusitis 07/30/2010   ACNE, ROSACEA 07/17/2010   LEG PAIN 07/17/2010   NECK PAIN 05/10/2010   ABDOMINAL  PAIN, EPIGASTRIC 05/10/2010   BURSITIS, LEFT HIP 05/01/2010   PLANTAR FASCIITIS, BILATERAL 05/01/2010   HYPERGLYCEMIA 01/05/2010   PRURITUS 11/27/2009   TOBACCO USE, QUIT 11/27/2009   Anxiety state 11/22/2008   FATIGUE 11/22/2008   Essential hypertension 10/07/2008   Insomnia, unspecified 08/30/2008   Obesity 08/19/2008   NAUSEA ALONE 05/02/2008   VERTIGO 01/19/2008   DIARRHEA 01/19/2008   THYROID NODULE 06/23/2007   Dyslipidemia 06/23/2007   Situational depression 06/23/2007   ADD 06/23/2007   TMJ SYNDROME 06/23/2007   GERD 06/23/2007   Irritable bowel syndrome 06/23/2007   Headache 06/23/2007    Past Surgical History:  Procedure Laterality Date    ABDOMINAL HYSTERECTOMY  1990   ANTERIOR CERVICAL DECOMP/DISCECTOMY FUSION  02-10-2004   C4 -- C7   BACK SURGERY     CARDIOVASCULAR STRESS TEST  2017   Negative   CESAREAN SECTION  1977; [place] W/ RETROGRADES Right 06/07/2015   Procedure: CYSTOSCOPY WITH RETROGRADE PYELOGRAM;  Surgeon: Ardis Hughs, MD;  Location: [facility];  Service: Urology;  Laterality: Right;   CYSTOSCOPY WITH URETEROSCOPY AND STENT PLACEMENT Right 06/07/2015   Procedure: RIGHT URETEROSCOPY, LASER LITHOTRIPSY, STONE REMOVAL AND RIGHT URETERAL STENT PLACEMENT;  Surgeon: Ardis Hughs, MD;  Location: [facility];  Service: Urology;  Laterality: Right;   HERNIA REPAIR     HOLMIUM LASER APPLICATION N/A 08/08/2835   Procedure: HOLMIUM LASER APPLICATION;  Surgeon: Ardis Hughs, MD;  Location: [facility];  Service: Urology;  Laterality: N/A;   LAPAROSCOPIC CHOLECYSTECTOMY  10-07-2000   LIPOMA EXCISION  01/2017   from neck   UMBILICAL HERNIA REPAIR  age 59   UPPER GI ENDOSCOPY       OB History     Gravida  3   Para  2   Term  2   Preterm      AB  1   Living  2      SAB      IAB  1   Ectopic      Multiple      Live Births              Family History  Problem Relation Age of Onset   Colon cancer Maternal Grandfather    Hypertension Maternal Grandfather    Lung cancer Maternal Grandfather    Throat cancer Maternal Grandfather    Hyperlipidemia Mother    Hypertension Mother    Sleep apnea Mother    Osteoporosis Mother    Hypertension Father    Heart disease Father    Syncope episode Father    Colon cancer Father    Atrial fibrillation Father    Lung cancer Maternal Aunt    Hypertension Sister    Sleep apnea Sister    Sleep apnea Brother    Colon cancer Paternal Grandfather    Hypertension Maternal Grandmother    CVA Maternal Grandmother    Hypertension Brother     Social History   Tobacco Use    Smoking status: Former    Packs/day: 0.50    Years: 25.00    Pack years: 12.50    Types: Cigarettes    Quit date: 07/24/2008    Years since quitting: 13.1   Smokeless tobacco: Never  Vaping Use   Vaping Use: Never used  Substance Use Topics   Alcohol use: Not Currently    Comment: 5 drinks per year or less  Drug use: No    Home Medications Prior to Admission medications   Medication Sig Start Date End Date Taking? Authorizing Provider  sucralfate (CARAFATE) 1 g tablet Take 1 tablet (1 g total) by mouth 4 (four) times daily -  with meals and at bedtime for 14 days. 09/14/21 09/28/21 Yes Keva Darty A, PA-C  acetaminophen (TYLENOL) 500 MG tablet Take 1,000 mg by mouth every 6 (six) hours as needed for headache (pain).    [provider]  albuterol (VENTOLIN HFA) 108 (90 Base) MCG/ACT inhaler as needed. 01/11/21   [provider]  Cholecalciferol (VITAMIN D3 PO) Take 1 tablet by mouth at bedtime.    [provider]  Cyanocobalamin (VITAMIN B 12 PO) 1 capsule with food or milk5052mcg    [provider]  cyclobenzaprine (FLEXERIL) 5 MG tablet Take 1 tablet (5 mg total) by mouth 2 (two) times daily as needed for muscle spasms. 07/02/20   Geradine Girt, DO  dicyclomine (BENTYL) 10 MG capsule Take 2 capsules by mouth in the morning and at bedtime.    [provider]  famotidine (PEPCID) 20 MG tablet Take 1 tablet (20 mg total) by mouth at bedtime. 01/04/21   Lauraine Rinne, NP  pantoprazole (PROTONIX) 40 MG tablet Take 40 mg by mouth at bedtime.     [provider]  pantoprazole (PROTONIX) 40 MG tablet Take 1 tablet (40 mg total) by mouth daily. 05/15/21   Irene Shipper, MD  vitamin E 1000 UNIT capsule Take 1,000 Units by mouth at bedtime.    [provider]    Allergies    Detrol [tolterodine], Gabapentin, Aspirin, Contrast media [iodinated diagnostic agents], Hydrocodone-acetaminophen, Imitrex [sumatriptan], Oxycodone, Vicodin  hp [hydrocodone-acetaminophen], and Avelox [moxifloxacin]  Review of Systems   Review of Systems  Constitutional: Negative.   HENT: Negative.    Respiratory: Negative.    Cardiovascular: Negative.   Gastrointestinal:  Positive for abdominal pain, diarrhea and nausea. Negative for abdominal distention, anal bleeding, blood in stool, constipation, rectal pain and vomiting.  Genitourinary: Negative.   Musculoskeletal: Negative.   Skin: Negative.   Neurological: Negative.   All other systems reviewed and are negative.  Physical Exam Updated Vital Signs BP (!) 170/73   Pulse 64   Temp 98.4 F (36.9 C) (Oral)   Resp 19   Ht 5' 4.5" (1.638 m)   Wt 117.5 kg   SpO2 99%   BMI 43.78 kg/m   Physical Exam Vitals and nursing note reviewed.  Constitutional:      General: She is not in acute distress.    Appearance: She is well-developed. She is not ill-appearing, toxic-appearing or diaphoretic.  HENT:     Head: Normocephalic and atraumatic.     Mouth/Throat:     Mouth: Mucous membranes are moist.  Eyes:     Pupils: Pupils are equal, round, and reactive to light.  Cardiovascular:     Rate and Rhythm: Normal rate.     Pulses:          Radial pulses are 2+ on the right side and 2+ on the left side.       Dorsalis pedis pulses are 2+ on the right side and 2+ on the left side.     Heart sounds: Normal heart sounds.  Pulmonary:     Effort: Pulmonary effort is normal. No respiratory distress.     Breath sounds: Normal breath sounds.     Comments: Clear, Bl. Speaks in full  sentences without difficulty Abdominal:     General: Bowel sounds are normal. There is no distension.     Palpations: Abdomen is soft.     Tenderness: There is generalized abdominal tenderness and tenderness in the epigastric area and left upper quadrant. There is no right CVA tenderness, left CVA tenderness, guarding or rebound. Negative signs include Murphy's sign and McBurney's sign.     Comments: Generalized  tenderness however worse to epigastric and LUQ. No midline pulsatile mas.  Musculoskeletal:        General: Normal range of motion.     Cervical back: Normal range of motion.     Comments: No bony tenderness. Full ROM without difficulty  Skin:    General: Skin is warm and dry.     Capillary Refill: Capillary refill takes less than 2 seconds.     Comments: No rash or lesions  Neurological:     General: No focal deficit present.     Mental Status: She is alert and oriented to person, place, and time.     Comments: Ambulatory without difficulty  Psychiatric:        Mood and Affect: Mood normal.   ED Results / Procedures / Treatments   Labs (all labs ordered are listed, but only abnormal results are displayed) Labs Reviewed  COMPREHENSIVE METABOLIC PANEL - Abnormal; Notable for the following components:      Result Value   Glucose, Bld 110 (*)    Total Bilirubin 0.2 (*)    All other components within normal limits  CBC - Abnormal; Notable for the following components:   WBC 10.7 (*)    All other components within normal limits  URINALYSIS, ROUTINE W REFLEX MICROSCOPIC - Abnormal; Notable for the following components:   APPearance HAZY (*)    Specific Gravity, Urine >1.030 (*)    Hgb urine dipstick SMALL (*)    All other components within normal limits  URINALYSIS, MICROSCOPIC (REFLEX) - Abnormal; Notable for the following components:   Bacteria, UA MANY (*)    All other components within normal limits  LIPASE, BLOOD  TROPONIN I (HIGH SENSITIVITY)  TROPONIN I (HIGH SENSITIVITY)    EKG None  Radiology CT Abdomen Pelvis Wo Contrast  Result Date: 09/14/2021 CLINICAL DATA:  Acute abdominal pain for 2 days EXAM: CT ABDOMEN AND PELVIS WITHOUT CONTRAST TECHNIQUE: Multidetector CT imaging of the abdomen and pelvis was performed following the standard protocol without IV contrast. COMPARISON:  12/01/2018 FINDINGS: Lower chest: No acute abnormality. Hepatobiliary: Fatty infiltration  of the liver is noted. Gallbladder has been surgically removed. Pancreas: Unremarkable. No pancreatic ductal dilatation or surrounding inflammatory changes. Spleen: Normal in size without focal abnormality. Adrenals/Urinary Tract: Adrenal glands are within normal limits. Kidneys are well visualize without renal calculi or obstructive change. The ureters are within normal limits. The bladder is partially distended. Stomach/Bowel: Scattered diverticular change of the colon is noted. No findings to suggest diverticulitis are seen. The appendix is within normal limits. No obstructive or inflammatory changes of the colon are noted. The stomach and small bowel are within limits. Vascular/Lymphatic: Aortic atherosclerosis. No enlarged abdominal or pelvic lymph nodes. Reproductive: Pessary is noted in place. Uterus has been surgically removed. No adnexal mass is seen. Other: No abdominal wall hernia or abnormality. No abdominopelvic ascites. Musculoskeletal: No acute or significant osseous findings. IMPRESSION: Fatty liver. Diverticulosis without diverticulitis. Normal appearing appendix. Electronically Signed   By: Inez Catalina M.D.   On: 09/14/2021 21:00    Procedures Procedures  Medications Ordered in ED Medications  morphine 4 MG/ML injection 4 mg (4 mg Intramuscular Given 09/14/21 2113)  ondansetron (ZOFRAN-ODT) disintegrating tablet 4 mg (4 mg Oral Given 09/14/21 2113)  alum & mag hydroxide-simeth (MAALOX/MYLANTA) 200-200-20 MG/5ML suspension 30 mL (30 mLs Oral Given 09/14/21 2113)    And  lidocaine (XYLOCAINE) 2 % viscous mouth solution 15 mL (15 mLs Oral Given 09/14/21 2113)  dicyclomine (BENTYL) capsule 10 mg (10 mg Oral Given 09/14/21 2113)    ED Course  I have reviewed the triage vital signs and the nursing notes.  Pertinent labs & imaging results that were available during my care of the patient were reviewed by me and considered in my medical decision making (see chart for details).  Here  for evaluation of abd pain. Afebrile, non septic non-ill appearing.  States pain began 2 days ago however does seem to have some chronic pain, early satiety per chart review.  Pain is located epigastric and left upper quadrant.  No chest pain, shortness of breath.  She is neurovascularly intact.  No rebound or guarding however does have diffuse pain.  No melena or blood per rectum.  No urinary complaints.   Labs and imaging personally reviewed and interpreted:  CBC leukocytosis at 11.9 Metabolic panel glucose 417 Lipase 29 UA many bacteria, nitrate, leuks negative, no urinary Trop 5 CT AP without acute abnormality. Unfortunately allergy to contrast dye and cannot have oral contrast per patient. Low suspicion for mesenteric ischemia, AAA, dissection, bowel perforation, obstruction. EKG no ischemic changes  Patient reassessed.  Pain improved.  Given pain worse with food intake she has no gallbladder, question ulcer?  Will start on Carafate and have her follow-up with GI.  Normal BUN, denies melena low suspicion for bleeding ulcer. No obvious perforation on imaging.  Given reassuring troponins, EKG low suspicion for atypical acute ACS, PE, dissection. Tolerating PO intake.  On repeat exam patient does not have a surgical abdomin and there are no peritoneal signs.  No indication of appendicitis, bowel obstruction, bowel perforation, cholecystitis, diverticulitis, AAA, dissection.   The patient has been appropriately medically screened and/or stabilized in the ED. I have low suspicion for any other emergent medical condition which would require further screening, evaluation or treatment in the ED or require inpatient management.  Patient is hemodynamically stable and in no acute distress.  Patient able to ambulate in department prior to ED.  Evaluation does not show acute pathology that would require ongoing or additional emergent interventions while in the emergency department or further inpatient  treatment.  I have discussed the diagnosis with the patient and answered all questions.  Pain is been managed while in the emergency department and patient has no further complaints prior to discharge.  Patient is comfortable with plan discussed in room and is stable for discharge at this time.  I have discussed strict return precautions for returning to the emergency department.  Patient was encouraged to follow-up with PCP/specialist refer to at discharge.    MDM Rules/Calculators/A&P                            Final Clinical Impression(s) / ED Diagnoses Final diagnoses:  Epigastric pain    Rx / DC Orders ED Discharge Orders          Ordered    sucralfate (CARAFATE) 1 g tablet  3 times daily with meals & bedtime        09/14/21 2213  My [place] A, PA-C 09/14/21 [place], [place], DO 09/14/21 2257

## 2021-09-14 NOTE — Discharge Instructions (Addendum)
You likely have an ulcer as the cause of your pain.  I have started on Carafate.  Take as prescribed.  Follow-up with GI for reevaluation of her symptoms.

## 2021-09-14 NOTE — ED Triage Notes (Signed)
Abdominal pain x 2 days. Her MD thinks she has pancreatitis. She has a hx of diverticulitis.

## 2021-09-17 ENCOUNTER — Ambulatory Visit (HOSPITAL_BASED_OUTPATIENT_CLINIC_OR_DEPARTMENT_OTHER): Payer: Medicare HMO | Admitting: Physical Therapy

## 2021-09-17 DIAGNOSIS — R1013 Epigastric pain: Secondary | ICD-10-CM | POA: Diagnosis not present

## 2021-09-17 DIAGNOSIS — K589 Irritable bowel syndrome without diarrhea: Secondary | ICD-10-CM | POA: Diagnosis not present

## 2021-09-19 ENCOUNTER — Ambulatory Visit: Payer: Medicare HMO

## 2021-09-24 ENCOUNTER — Ambulatory Visit (HOSPITAL_BASED_OUTPATIENT_CLINIC_OR_DEPARTMENT_OTHER): Payer: Medicare HMO | Admitting: Physical Therapy

## 2021-09-25 ENCOUNTER — Ambulatory Visit (INDEPENDENT_AMBULATORY_CARE_PROVIDER_SITE_OTHER): Payer: Medicare HMO | Admitting: Internal Medicine

## 2021-09-25 ENCOUNTER — Encounter: Payer: Self-pay | Admitting: Internal Medicine

## 2021-09-25 VITALS — BP 138/70 | HR 70 | Ht 64.5 in | Wt 259.0 lb

## 2021-09-25 DIAGNOSIS — K219 Gastro-esophageal reflux disease without esophagitis: Secondary | ICD-10-CM | POA: Diagnosis not present

## 2021-09-25 DIAGNOSIS — R1313 Dysphagia, pharyngeal phase: Secondary | ICD-10-CM | POA: Diagnosis not present

## 2021-09-25 DIAGNOSIS — R109 Unspecified abdominal pain: Secondary | ICD-10-CM

## 2021-09-25 DIAGNOSIS — J392 Other diseases of pharynx: Secondary | ICD-10-CM | POA: Diagnosis not present

## 2021-09-25 DIAGNOSIS — Z87891 Personal history of nicotine dependence: Secondary | ICD-10-CM | POA: Diagnosis not present

## 2021-09-25 MED ORDER — CILIDINIUM-CHLORDIAZEPOXIDE 2.5-5 MG PO CAPS: 1.0000 | ORAL_CAPSULE | Freq: Three times a day (TID) | ORAL | 3 refills | Status: DC

## 2021-09-25 NOTE — Patient Instructions (Signed)
If you are age 61 or older, your body mass index should be between 23-30. Your Body mass index is 43.77 kg/m. If this is out of the aforementioned range listed, please consider follow up with your Primary Care Provider.  If you are age 34 or younger, your body mass index should be between 19-25. Your Body mass index is 43.77 kg/m. If this is out of the aformentioned range listed, please consider follow up with your Primary Care Provider.   ________________________________________________________  The [facility] providers would like to encourage you to use [facility] to communicate with providers for non-urgent requests or questions.  Due to long hold times on the telephone, sending your provider a message by [facility] may be a faster and more efficient way to get a response.  Please allow 48 business hours for a response.  Please remember that this is for non-urgent requests.  _______________________________________________________  We have sent the following medications to your pharmacy for you to pick up at your convenience:  Librax

## 2021-09-25 NOTE — Progress Notes (Signed)
HISTORY OF PRESENT ILLNESS:  Paula [name] is a 60 y.o. female with chronic functional abdominal pain, fibromyalgia, irritable bowel syndrome, chronic migraines, and morbid obesity.  She presents today regarding chronic abdominal pain.  Patient does have GERD for which she is on medical therapy.  Also history of IBS with diarrhea predominance.  She reestablished with this practice March 2022.  See that dictation for details.  We increase pantoprazole.  She subsequently underwent colonoscopy and upper endoscopy.  These were unrevealing.  Blood work is been unrevealing.  She continued to complain of postprandial bloating discomfort.  She is concerned about her increasing abdominal girth and weight gain.  Gastric emptying study was normal.  IBgard recommended.  She did not start.  She saw her PCP complaining of severe postprandial abdominal pain, last week.  She was sent to the emergency room.  The evaluation was negative.  Normal laboratories including liver tests and lipase.  Essentially normal CBC.  Negative CT scan.  She is status postcholecystectomy.  She is accompanied today by her husband.  Her complaints continue.  Postprandial fullness and abdominal discomfort.  She does better when she eats less.  She is still concerned about weight gain.  She has a hard time exercising, she says, due to abdominal discomfort.  She did explore possibility of bariatric surgery but has decided against it for the time being.  REVIEW OF SYSTEMS:  All non-GI ROS negative unless otherwise stated in the HPI except for back pain  Past Medical History:  Diagnosis Date   Anxiety    pt stated anxiety episodes resemble stroke symptoms   Chronic lower back pain    Conversion disorder    Depression    Diverticulitis    Expressive aphasia 11/15/2016   Fibromyalgia    GERD (gastroesophageal reflux disease)    Heart murmur    High cholesterol    History of gastric ulcer    History of hiatal hernia    History of kidney  stones    History of thyroid nodule    Hyperlipidemia    Hypertension    IBS (irritable bowel syndrome)    Intermittent vertigo    Migraine    "a few times/year now; maybe" (11/15/2016)   Obesity    OSA on CPAP    wears CPAP nightly ;study in epic 02-01-2014 (11/15/2016)   Osteoarthritis    "knees, hands, back, hips" (11/15/2016)   Pneumonia X 1   hx of   Psychogenic tremor    intermittant head tremor-(11/15/2016)   Refusal of blood transfusions as patient is Jehovah's Witness    RLS (restless legs syndrome)    Sleep apnea    CPAP   Urinary incontinence    Vertigo     Past Surgical History:  Procedure Laterality Date   ABDOMINAL HYSTERECTOMY  1990   ANTERIOR CERVICAL DECOMP/DISCECTOMY FUSION  02-10-2004   C4 -- C7   BACK SURGERY     CARDIOVASCULAR STRESS TEST  2017   Negative   CESAREAN SECTION  1977; 1982   COLONOSCOPY     CYSTOSCOPY W/ RETROGRADES Right 06/07/2015   Procedure: CYSTOSCOPY WITH RETROGRADE PYELOGRAM;  Surgeon: Ardis Hughs, MD;  Location: [facility];  Service: Urology;  Laterality: Right;   CYSTOSCOPY WITH URETEROSCOPY AND STENT PLACEMENT Right 06/07/2015   Procedure: RIGHT URETEROSCOPY, LASER LITHOTRIPSY, STONE REMOVAL AND RIGHT URETERAL STENT PLACEMENT;  Surgeon: Ardis Hughs, MD;  Location: [facility];  Service: Urology;  Laterality: Right;  HERNIA REPAIR     HOLMIUM LASER APPLICATION N/A 01/07/7123   Procedure: HOLMIUM LASER APPLICATION;  Surgeon: Ardis Hughs, MD;  Location: [facility];  Service: Urology;  Laterality: N/A;   LAPAROSCOPIC CHOLECYSTECTOMY  10-07-2000   LIPOMA EXCISION  01/2017   from neck   UMBILICAL HERNIA REPAIR  age 81   UPPER GI ENDOSCOPY      Social History JOETTA DELPRADO  reports that she quit smoking about 13 years ago. Her smoking use included cigarettes. She has a 12.50 pack-year smoking history. She has never used smokeless tobacco. She reports that she does not  currently use alcohol. She reports that she does not use drugs.  family history includes Atrial fibrillation in her father; CVA in her maternal grandmother; Colon cancer in her father, maternal aunt, maternal grandfather, and paternal grandfather; Heart disease in her father; Hyperlipidemia in her mother; Hypertension in her brother, father, maternal grandfather, maternal grandmother, mother, and sister; Lung cancer in her maternal aunt and maternal grandfather; Osteoporosis in her mother; Pancreatic cancer in her maternal grandfather; Sleep apnea in her brother, mother, and sister; Syncope episode in her father; Throat cancer in her maternal grandfather.  Allergies  Allergen Reactions   Detrol [Tolterodine] Other (See Comments)    slurred speech, tremors, dizziness   Gabapentin Palpitations and Other (See Comments)    Wt gain, tremor   Aspirin Other (See Comments)    Avoids--- history of gastric ulcers    Contrast Media [Iodinated Diagnostic Agents] Hives, Itching and Rash    CT contrast-Needed to take benadryl    Hydrocodone-Acetaminophen Nausea And Vomiting   Imitrex [Sumatriptan] Other (See Comments)    Body hurts   Oxycodone Nausea And Vomiting   Vicodin Hp [Hydrocodone-Acetaminophen] Nausea And Vomiting   Avelox [Moxifloxacin] Itching       PHYSICAL EXAMINATION: Vital signs: BP 138/70   Pulse 70   Ht 5' 4.5" (1.638 m)   Wt 259 lb (117.5 kg)   SpO2 97%   BMI 43.77 kg/m   Constitutional: Pleasant, generally well-appearing, no acute distress Psychiatric: alert and oriented x3, cooperative Eyes: extraocular movements intact, anicteric, conjunctiva pink Mouth: oral pharynx moist, no lesions Neck: supple no lymphadenopathy Cardiovascular: heart regular rate and rhythm, no murmur Lungs: clear to auscultation bilaterally Abdomen: soft, obese, with complaints of tenderness with minimal palpation, nondistended, no obvious ascites, no peritoneal signs, normal bowel sounds, no  organomegaly Rectal: Omitted Extremities: no open, cyanosis, or lower extremity edema bilaterally Skin: no lesions on visible extremities Neuro: No focal deficits.  Cranial nerves intact  ASSESSMENT:  1.  Chronic functional abdominal pain.  Ongoing.  Negative extensive work-up 2.  Other functional diagnoses including diarrhea predominant IBS and fibromyalgia 3.  Morbid obesity 4.  Colonoscopy June 2022 with diverticulosis and internal hemorrhoids 5.  Upper endoscopy June 2022 was normal.   PLAN:  1.  Discussion on chronic abdominal pain 2.  Reassurance 3.  Small meals 4.  Exercise and weight loss 5.  Prescribed Librax.  Take 120 minutes before meals as needed 6.  Reflux precautions 7.  PPI.  Lowest dose to control classic reflux symptoms 8.  Routine GI follow-up 1 year.  Ongoing general medical care with PCP, Dr. Dema Severin.  A total time of 30 minutes was spent preparing to see the patient, reviewing outside x-rays and laboratories, reviewing emergency room visit, reviewing PCP visit, obtaining interval comprehensive history, performing a medically appropriate physical exam, counseling the patient and her husband regarding  the above listed issues, ordering medications, and documenting clinical information in the health record

## 2021-09-26 ENCOUNTER — Telehealth: Payer: Self-pay | Admitting: Internal Medicine

## 2021-09-26 NOTE — Telephone Encounter (Signed)
Inbound call from patient. States the medication Librax is [phone] and insurance will not cover. Ask if there is anything else.

## 2021-09-27 NOTE — Telephone Encounter (Signed)
IBgard.  She can pick this up over-the-counter.  Have her take 1 before meals

## 2021-09-27 NOTE — Telephone Encounter (Signed)
Sent patient mychart message with Dr. Blanch Media recommendations

## 2021-09-27 NOTE — Telephone Encounter (Signed)
Please advise 

## 2021-10-01 ENCOUNTER — Ambulatory Visit (HOSPITAL_BASED_OUTPATIENT_CLINIC_OR_DEPARTMENT_OTHER): Payer: Medicare HMO | Admitting: Physical Therapy

## 2021-10-03 ENCOUNTER — Ambulatory Visit: Payer: Medicare HMO

## 2021-10-05 ENCOUNTER — Encounter: Payer: Self-pay | Admitting: Physician Assistant

## 2021-10-05 ENCOUNTER — Ambulatory Visit: Payer: Medicare HMO | Admitting: Physician Assistant

## 2021-10-05 ENCOUNTER — Ambulatory Visit (INDEPENDENT_AMBULATORY_CARE_PROVIDER_SITE_OTHER): Payer: Medicare HMO | Admitting: Physician Assistant

## 2021-10-05 VITALS — BP 150/100 | HR 64 | Ht 63.75 in | Wt 260.2 lb

## 2021-10-05 DIAGNOSIS — K219 Gastro-esophageal reflux disease without esophagitis: Secondary | ICD-10-CM | POA: Diagnosis not present

## 2021-10-05 DIAGNOSIS — R109 Unspecified abdominal pain: Secondary | ICD-10-CM | POA: Diagnosis not present

## 2021-10-05 NOTE — Patient Instructions (Addendum)
STOP Pantoprazole and Carafate.   Use Dexilant 60 mg every morning and Pepcid 20 mg nightly.  Dicyclomine 20 mg 1 tablet 20 minutes before meals and at bedtime.   IBGard 1 tablet before meals.  Follow up with PCP about fibromyalgia therapies.   If you are age 61 or younger, your body mass index should be between 19-25. Your Body mass index is 45.02 kg/m. If this is out of the aformentioned range listed, please consider follow up with your Primary Care Provider.   ________________________________________________________  The [facility] providers would like to encourage you to use [facility] to communicate with providers for non-urgent requests or questions.  Due to long hold times on the telephone, sending your provider a message by [facility] may be a faster and more efficient way to get a response.  Please allow 48 business hours for a response.  Please remember that this is for non-urgent requests.  _______________________________________________________

## 2021-10-05 NOTE — Progress Notes (Signed)
Chief Complaint: Follow-up ER visit for epigastric pain  HPI:    Mrs. Paula [name] is a 60 year old African-American female, known to Dr. Henrene Pastor, with a past medical history as listed below including IBS, previous cholecystectomy, fibromyalgia, GERD and others, who was referred to me by Antony Contras, MD for follow-up after being seen in the ER for epigastric pain.    05/15/2021 EGD with reflux and otherwise normal.    05/15/2021 colonoscopy with diverticulosis and internal hemorrhoids and otherwise normal.  Repeat recommended 10 years.    09/14/2021 patient seen in the ER for epigastric pain.  At that time and had pain for 2 days.  It was worse with eating.  CMP with a glucose of 110 and otherwise normal.  CBC with a white count of 10.7.  Normal lipase.  CT and pelvis without contrast showed fatty liver and diverticulosis.  She was given GI cocktail and Zofran as well as Bentyl.  She was prescribed Carafate 1 g 4 times daily.    09/25/2021 patient seen by otolaryngology for throat concerns.    09/25/2021 patient saw Dr. Henrene Pastor at that time discussed that she had previously underwent colonoscopy and EGD in the summer which were unrevealing, blood work had been unrevealing.  She continues to complain of postprandial bloating discomfort.  Gastric emptying study have been normal.  IBgard was recommended but she never started this.  Also discussed evaluation in the ER as above with negative CT and labs.  She continued with postprandial fullness and abdominal discomfort.  She did better when she ate less.  She was concerned about weight gain.  That time discussed she had chronic functional abdominal pain with a negative extensive work-up.  She was given reassurance and told to maintain small meals and start exercise and weight loss.  She was given Librax today: 20 minutes before meals as needed.    09/26/2021 patient called and discussed that the Librax was too expensive.  She was told to take IBgard 1 before meals.     Today, the patient tells me that she really just return to clinic because she wanted to see if there is any medicines that she could stop taking.  Again she continues with generalized abdominal pain which is much unchanged from when she saw Dr. Henrene Pastor last week regardless of adding IBgard 1 tablet with meals over the past 4 to 5 days.  Her abdomen just hurts to the touch, for this reason she avoids tight close.  Tells me that she is on a lot of medicines right now and wants to know what to stop.  Taking Dexilant 60 mg in the morning, Bentyl 20 mg before meals and at bedtime, IBgard 1 capsule before meals, Pepcid 20 mg before bedtime and Pantoprazole before bedtime as well as Carafate 1 g 4 times daily.  Tells me this is just too much medicine to keep track of.  Really she felt her best when taking the Bentyl and Carafate.    We briefly discussed her fibromyalgia diagnosis, apparently this was diagnosed by her PCP but she has never been on any medications or therapies to help with this.    Denies fever, chills, weight loss, blood in her stool or symptoms that awaken her from sleep.  Past Medical History:  Diagnosis Date   Anxiety    pt stated anxiety episodes resemble stroke symptoms   Chronic lower back pain    Conversion disorder    Depression    Diverticulitis  Expressive aphasia 11/15/2016   Fibromyalgia    GERD (gastroesophageal reflux disease)    Heart murmur    High cholesterol    History of gastric ulcer    History of hiatal hernia    History of kidney stones    History of thyroid nodule    Hyperlipidemia    Hypertension    IBS (irritable bowel syndrome)    Intermittent vertigo    Migraine    "a few times/year now; maybe" (11/15/2016)   Obesity    OSA on CPAP    wears CPAP nightly ;study in epic 02-01-2014 (11/15/2016)   Osteoarthritis    "knees, hands, back, hips" (11/15/2016)   Pneumonia X 1   hx of   Psychogenic tremor    intermittant head tremor-(11/15/2016)    Refusal of blood transfusions as patient is Jehovah's Witness    RLS (restless legs syndrome)    Sleep apnea    CPAP   Urinary incontinence    Vertigo     Past Surgical History:  Procedure Laterality Date   ABDOMINAL HYSTERECTOMY  1990   ANTERIOR CERVICAL DECOMP/DISCECTOMY FUSION  02-10-2004   C4 -- C7   BACK SURGERY     CARDIOVASCULAR STRESS TEST  2017   Negative   CESAREAN SECTION  1977; 1982   COLONOSCOPY     CYSTOSCOPY W/ RETROGRADES Right 06/07/2015   Procedure: CYSTOSCOPY WITH RETROGRADE PYELOGRAM;  Surgeon: Ardis Hughs, MD;  Location: [facility];  Service: Urology;  Laterality: Right;   CYSTOSCOPY WITH URETEROSCOPY AND STENT PLACEMENT Right 06/07/2015   Procedure: RIGHT URETEROSCOPY, LASER LITHOTRIPSY, STONE REMOVAL AND RIGHT URETERAL STENT PLACEMENT;  Surgeon: Ardis Hughs, MD;  Location: [facility];  Service: Urology;  Laterality: Right;   HERNIA REPAIR     HOLMIUM LASER APPLICATION N/A 9/0/2409   Procedure: HOLMIUM LASER APPLICATION;  Surgeon: Ardis Hughs, MD;  Location: [facility];  Service: Urology;  Laterality: N/A;   LAPAROSCOPIC CHOLECYSTECTOMY  10-07-2000   LIPOMA EXCISION  01/2017   from neck   UMBILICAL HERNIA REPAIR  age 76   UPPER GI ENDOSCOPY      Current Outpatient Medications  Medication Sig Dispense Refill   acetaminophen (TYLENOL) 500 MG tablet Take 1,000 mg by mouth every 6 (six) hours as needed for headache (pain).     albuterol (VENTOLIN HFA) 108 (90 Base) MCG/ACT inhaler as needed.     Cholecalciferol (VITAMIN D3 PO) Take 1 tablet by mouth at bedtime.     clidinium-chlordiazePOXIDE (LIBRAX) 5-2.5 MG capsule Take 1 capsule by mouth 3 (three) times daily before meals. 90 capsule 3   Cyanocobalamin (VITAMIN B 12 PO) 1 capsule with food or milk5068mcg     cyclobenzaprine (FLEXERIL) 5 MG tablet Take 1 tablet (5 mg total) by mouth 2 (two) times daily as needed for muscle spasms. 12 tablet 0    dicyclomine (BENTYL) 10 MG capsule Take 2 capsules by mouth in the morning and at bedtime.     famotidine (PEPCID) 20 MG tablet Take 1 tablet (20 mg total) by mouth at bedtime. (Patient not taking: Reported on 09/25/2021) 30 tablet 3   pantoprazole (PROTONIX) 40 MG tablet Take 1 tablet (40 mg total) by mouth daily. (Patient taking differently: Take 40 mg by mouth 2 (two) times daily before a meal.) 90 tablet 3   sucralfate (CARAFATE) 1 g tablet Take 1 tablet (1 g total) by mouth 4 (four) times daily -  with meals  and at bedtime for 14 days. 56 tablet 0   vitamin E 1000 UNIT capsule Take 1,000 Units by mouth at bedtime.     Current Facility-Administered Medications  Medication Dose Route Frequency Provider Last Rate Last Admin   0.9 %  sodium chloride infusion  500 mL Intravenous Once Irene Shipper, MD        Allergies as of 10/05/2021 - Review Complete 09/25/2021  Allergen Reaction Noted   Detrol [tolterodine] Other (See Comments) 11/15/2016   Gabapentin Palpitations and Other (See Comments) 07/29/2014   Aspirin Other (See Comments) 06/23/2007   Contrast media [iodinated diagnostic agents] Hives, Itching, and Rash 11/15/2016   Hydrocodone-acetaminophen Nausea And Vomiting 06/23/2007   Imitrex [sumatriptan] Other (See Comments) 07/29/2014   Oxycodone Nausea And Vomiting 02/11/2012   Vicodin hp [hydrocodone-acetaminophen] Nausea And Vomiting 04/23/2021   Avelox [moxifloxacin] Itching 11/27/2009    Family History  Problem Relation Age of Onset   Hyperlipidemia Mother    Hypertension Mother    Sleep apnea Mother    Osteoporosis Mother    Hypertension Father    Heart disease Father    Syncope episode Father    Colon cancer Father    Atrial fibrillation Father    Hypertension Sister    Sleep apnea Sister    Sleep apnea Brother    Hypertension Brother    Colon cancer Maternal Aunt    Lung cancer Maternal Aunt    Hypertension Maternal Grandmother    CVA Maternal Grandmother     Pancreatic cancer Maternal Grandfather    Colon cancer Maternal Grandfather    Hypertension Maternal Grandfather    Lung cancer Maternal Grandfather    Throat cancer Maternal Grandfather    Colon cancer Paternal Grandfather    Esophageal cancer Neg Hx    Liver cancer Neg Hx    Stomach cancer Neg Hx     Social History   Socioeconomic History   Marital status: Married    Spouse name: Juanda Crumble   Number of children: 2   Years of education: 11   Highest education level: Not on file  Occupational History   Occupation: disabled  Tobacco Use   Smoking status: Former    Packs/day: 0.50    Years: 25.00    Pack years: 12.50    Types: Cigarettes    Quit date: 07/24/2008    Years since quitting: 13.2   Smokeless tobacco: Never  Vaping Use   Vaping Use: Never used  Substance and Sexual Activity   Alcohol use: Not Currently    Comment: 5 drinks per year or less   Drug use: No   Sexual activity: Yes  Other Topics Concern   Not on file  Social History Narrative   Married, lives at home      Caffeine use - coffee daily (decaf), tea once a week      Jehovah's Witness - no blood products   Right handed   Social Determinants of Health   Financial Resource Strain: Not on file  Food Insecurity: Not on file  Transportation Needs: Not on file  Physical Activity: Not on file  Stress: Not on file  Social Connections: Not on file  Intimate Partner Violence: Not on file    Review of Systems:    Constitutional: No weight loss, fever or chills Cardiovascular: No chest pain Respiratory: No SOB or cough Gastrointestinal: See HPI and otherwise negative   Physical Exam:  Vital signs: BP (!) 150/100 (BP Location: Left Arm, Patient  Position: Sitting, Cuff Size: Large)   Pulse 64   Ht 5' 3.75" (1.619 m) Comment: height measured without shoes  Wt 260 lb 4 oz (118 kg)   BMI 45.02 kg/m    Constitutional:   Pleasant obese AA female appears to be in NAD, Well developed, Well nourished,  alert and cooperative Respiratory: Respirations even and unlabored. Lungs clear to auscultation bilaterally.   No wheezes, crackles, or rhonchi.  Cardiovascular: Normal S1, S2. No MRG. Regular rate and rhythm. No peripheral edema, cyanosis or pallor.  Gastrointestinal:  Soft, nondistended, marked TTP to only light palpation with involuntary guarding, normal bowel sounds. No appreciable masses or hepatomegaly. Rectal:  Not performed.  Psychiatric: Oriented to person, place and time. Demonstrates good judgement and reason without abnormal affect or behaviors.  RELEVANT LABS AND IMAGING: CBC    Component Value Date/Time   WBC 10.7 (H) 09/14/2021 1751   RBC 4.49 09/14/2021 1751   HGB 13.2 09/14/2021 1751   HCT 39.6 09/14/2021 1751   PLT 372 09/14/2021 1751   MCV 88.2 09/14/2021 1751   MCH 29.4 09/14/2021 1751   MCHC 33.3 09/14/2021 1751   RDW 12.1 09/14/2021 1751   LYMPHSABS 4.5 (H) 06/30/2020 1637   MONOABS 0.6 06/30/2020 1637   EOSABS 0.3 06/30/2020 1637   BASOSABS 0.1 06/30/2020 1637    CMP     Component Value Date/Time   NA 140 09/14/2021 1751   NA 142 08/12/2018 1143   K 3.6 09/14/2021 1751   CL 106 09/14/2021 1751   CO2 25 09/14/2021 1751   GLUCOSE 110 (H) 09/14/2021 1751   BUN 14 09/14/2021 1751   BUN 13 08/12/2018 1143   CREATININE 0.70 09/14/2021 1751   CALCIUM 10.0 09/14/2021 1751   PROT 7.8 09/14/2021 1751   PROT 7.4 08/12/2018 1143   ALBUMIN 4.7 09/14/2021 1751   ALBUMIN 4.9 08/12/2018 1143   AST 32 09/14/2021 1751   ALT 40 09/14/2021 1751   ALKPHOS 72 09/14/2021 1751   BILITOT 0.2 (L) 09/14/2021 1751   BILITOT 0.4 08/12/2018 1143   GFRNONAA >60 09/14/2021 1751   GFRAA >60 07/01/2020 0206    Assessment: 1.  Generalized chronic abdominal pain: To only light palpation, query if this is really just her fibromyalgia 2.  GERD: Mainly at night/in the early morning regardless of her Dexilant and Famotidine, recent EGD was normal; likely functional  dyspepsia  Plan: 1.  Discussed with patient that she should stop her Pantoprazole and Carafate.  She should continue on Dexilant 60 mg in the morning, Bentyl 20 mg before meals and at bedtime, IBgard before meals and Famotidine 20 mg at bedtime.  She does discuss a lot of her reflux symptoms are in the middle of the night which wake her from her sleep. 2.  Did try to discuss mechanism of action of all the medications and why she is on them.  I believe a lot of her symptoms are functional and could be related to her fibromyalgia given that she is so painful to only light palpation.  She has never discussed this diagnosis with anyone and has never been on any therapies including gabapentin or other to help with this.  Encouraged her to call her PCP and make a follow-up with them so they can discuss further.  I feel like this could be adding to abdominal pain.  If she can get on medicine for this then she may possibly be able to stop some of the medicines above. 3.  Patient follow in clinic with me in 2 to 3 months.  Ellouise Newer, PA-C [facility] 10/05/2021, 10:24 AM  Cc: Antony Contras, MD

## 2021-10-08 ENCOUNTER — Ambulatory Visit: Payer: Medicare HMO | Attending: Nurse Practitioner | Admitting: Physical Therapy

## 2021-10-08 ENCOUNTER — Encounter: Payer: Self-pay | Admitting: Physical Therapy

## 2021-10-08 ENCOUNTER — Other Ambulatory Visit: Payer: Self-pay

## 2021-10-08 DIAGNOSIS — M79604 Pain in right leg: Secondary | ICD-10-CM | POA: Insufficient documentation

## 2021-10-08 DIAGNOSIS — M6281 Muscle weakness (generalized): Secondary | ICD-10-CM | POA: Diagnosis not present

## 2021-10-08 DIAGNOSIS — U099 Post covid-19 condition, unspecified: Secondary | ICD-10-CM | POA: Insufficient documentation

## 2021-10-08 DIAGNOSIS — R262 Difficulty in walking, not elsewhere classified: Secondary | ICD-10-CM | POA: Diagnosis not present

## 2021-10-08 DIAGNOSIS — Z7409 Other reduced mobility: Secondary | ICD-10-CM | POA: Insufficient documentation

## 2021-10-08 DIAGNOSIS — M79605 Pain in left leg: Secondary | ICD-10-CM | POA: Insufficient documentation

## 2021-10-08 NOTE — Therapy (Signed)
[name], [place], [address] Phone: [phone]   Fax:  [phone]  Physical Therapy Treatment  Patient Details  Name: Paula [name] MRN: [id number] Date of Birth: 1961/11/22 Referring Provider (PT): Fenton Foy, NP   Encounter Date: 10/08/2021   PT End of Session - 10/08/21 1338     Visit Number 7    Number of Visits 17    Date for PT Re-Evaluation 10/18/21    Authorization Type [employer]    Authorization Time Period 9/22-11/18/22    Authorization - Visit Number 5    Authorization - Number of Visits 16    PT Start Time 0086    PT Stop Time 1400    PT Time Calculation (min) 45 min             Past Medical History:  Diagnosis Date   Anxiety    pt stated anxiety episodes resemble stroke symptoms   Chronic lower back pain    Conversion disorder    Depression    Diverticulitis    Expressive aphasia 11/15/2016   Fibromyalgia    GERD (gastroesophageal reflux disease)    Heart murmur    High cholesterol    History of gastric ulcer    History of hiatal hernia    History of kidney stones    History of thyroid nodule    Hyperlipidemia    Hypertension    IBS (irritable bowel syndrome)    Intermittent vertigo    Migraine    "a few times/year now; maybe" (11/15/2016)   Obesity    OSA on CPAP    wears CPAP nightly ;study in epic 02-01-2014 (11/15/2016)   Osteoarthritis    "knees, hands, back, hips" (11/15/2016)   Pneumonia X 1   hx of   Psychogenic tremor    intermittant head tremor-(11/15/2016)   Refusal of blood transfusions as patient is Jehovah's Witness    RLS (restless legs syndrome)    Sleep apnea    CPAP   Urinary incontinence    Vertigo     Past Surgical History:  Procedure Laterality Date   ABDOMINAL HYSTERECTOMY  1990   ANTERIOR CERVICAL DECOMP/DISCECTOMY FUSION  02-10-2004   C4 -- C7   BACK SURGERY     CARDIOVASCULAR STRESS TEST  2017   Negative   CESAREAN  SECTION  1977; [place] W/ RETROGRADES Right 06/07/2015   Procedure: CYSTOSCOPY WITH RETROGRADE PYELOGRAM;  Surgeon: Ardis Hughs, MD;  Location: [facility];  Service: Urology;  Laterality: Right;   CYSTOSCOPY WITH URETEROSCOPY AND STENT PLACEMENT Right 06/07/2015   Procedure: RIGHT URETEROSCOPY, LASER LITHOTRIPSY, STONE REMOVAL AND RIGHT URETERAL STENT PLACEMENT;  Surgeon: Ardis Hughs, MD;  Location: [facility];  Service: Urology;  Laterality: Right;   HERNIA REPAIR     HOLMIUM LASER APPLICATION N/A 06/06/60   Procedure: HOLMIUM LASER APPLICATION;  Surgeon: Ardis Hughs, MD;  Location: [facility];  Service: Urology;  Laterality: N/A;   LAPAROSCOPIC CHOLECYSTECTOMY  10-07-2000   LIPOMA EXCISION  01/2017   from neck   UMBILICAL HERNIA REPAIR  age 47   UPPER GI ENDOSCOPY      There were no vitals filed for this visit.   Subjective Assessment - 10/08/21 1317     Subjective I am having stomach issues. They really do not know. They changed my medicines and I am now starting to  feel better in my stomach.    Pain Score 3     Pain Location Abdomen                [facility] PT Assessment - 10/08/21 0001       Observation/Other Assessments   Focus on Therapeutic Outcomes (FOTO)  48% - no change      6 Minute Walk- Baseline   HR (bpm) 67    02 Sat (%RA) 98 %      6 Minute walk- Post Test   HR (bpm) 93    02 Sat (%RA) 97 %    Modified Borg Scale for Dyspnea 3- Moderate shortness of breath or breathing difficulty    Perceived Rate of Exertion (Borg) 13- Somewhat hard      6 minute walk test results    Aerobic Endurance Distance Walked 1265    Endurance additional comments 8/10 gluteal pain                           [facility] Adult PT Treatment/Exercise - 10/08/21 0001       Transfers   Five time sit to stand comments  13.4      Exercises   Exercises Knee/Hip      Knee/Hip  Exercises: Stretches   Active Hamstring Stretch 2 reps;30 seconds    Other Knee/Hip Stretches Lumbar extension x 10, LTR x 10 , piriformis stretch x  2 each    Other Knee/Hip Stretches standing 3 way hip x 10 each                       PT Short Term Goals - 10/08/21 1339       PT SHORT TERM GOAL #1   Title Patient will be independent with initial HEP.    Period Weeks    Status Achieved    Target Date 09/06/21      PT SHORT TERM GOAL #2   Title Patient will complete 5 x STS in less than 12 seconds to signify improvements in functional strength.    Baseline improving    Status On-going    Target Date 09/20/21      PT SHORT TERM GOAL #3   Title Patient will complete 6 MWT reporting </= 12/20 on BORG RPE to signify improved tolerance to aerobic activity.    Baseline 15/20; 10/08/21 13/20    Period Weeks    Status On-going               PT Long Term Goals - 08/23/21 1416       PT LONG TERM GOAL #1   Title Patient will be able to complete 15 reps of step ups from 8 inch step to signify ability to complete stair negotation at home.    Baseline unable to go up and down stairs at home    Status New    Target Date 10/18/21      PT LONG TERM GOAL #2   Title Patient will demonstrate at least 4+/5 strength in bilateral assessed hip musculature to improve stability about the chain with prolonged walking.    Baseline see flowsheet    Status New    Target Date 10/18/21      PT LONG TERM GOAL #3   Title Patient will report pain at worst as </= 6/10 to signify improvements in her overall condition    Baseline at worst 10/10 with prolonged  standing and walking    Status New    Target Date 10/18/21      PT LONG TERM GOAL #4   Title Patient will tolerate at least 20 minutes of continous standing activity during PT session to signify improved tolerance to household activity.    Baseline tolerates 5 minutes of standing/walking activity.    Status New    Target Date  10/18/21      PT LONG TERM GOAL #5   Title Patient will score at least 56% function on FOTO to signify clinically meaningul improvement in functional abilities.    Baseline 48%    Status New    Target Date 10/18/21                   Plan - 10/08/21 1352     Clinical Impression Statement Pt reports several weeks of MD appts recently regarding stomach issues. She is feeling a little better and able to participate in PT today. 6 MWT- 30 steps less but with improved BORG and Modified BORG ratings. 5 x STS improved. FOTO score unchanged. Reviewed current HEP. She c/o increasing buttock pain during 6MWT. Added LTR and piriformis stretching today which she reported feeling a good stretch. She is making progress toward her STGs and has been limited by her other health issues.    PT Treatment/Interventions ADLs/Self Care Home Management;Aquatic Therapy;Cryotherapy;Moist Heat;Electrical Stimulation;Gait training;Stair training;Functional mobility training;Therapeutic activities;Therapeutic exercise;Balance training;Neuromuscular re-education;Patient/family education;Manual techniques;Passive range of motion;Dry needling;Taping    PT Next Visit Plan hip and knee strengthening, core stabilization, dual tasking as able (walking with cognitive tasks)    PT Home Exercise Plan Access Code [email or web address]             Patient will benefit from skilled therapeutic intervention in order to improve the following deficits and impairments:  Difficulty walking, Obesity, Decreased endurance, Decreased activity tolerance, Pain, Impaired flexibility, Decreased balance, Postural dysfunction, Decreased strength, Decreased mobility  Visit Diagnosis: Muscle weakness (generalized)  Difficulty in walking, not elsewhere classified  COVID-19 long hauler manifesting chronic decreased mobility and endurance  Pain in both lower extremities     Problem List Patient Active Problem List   Diagnosis Date Noted    Physical deconditioning 08/22/2021   History of COVID-19 01/04/2021   Adult onset stuttering 06/30/2020   Migraine 06/30/2020   Fibromyalgia 06/30/2020   Type 2 diabetes mellitus without complication ([place]) 17/79/3903   TIA (transient ischemic attack) 06/30/2020   Atypical chest pain 08/13/2018   Depression, major 03/17/2018   Chronic pain syndrome 03/17/2018   Expressive aphasia 11/15/2016   Cough 11/08/2015   B12 deficiency 12/07/2014   Wart viral 11/07/2014   Coarse tremors 02/09/2014   OSA on CPAP 01/28/2014   Psychogenic movement disorder 01/14/2014   Dyspnea 01/12/2014   Tremor of face and hands 01/12/2014   Breast pain in female 02/09/2013   Elevated WBC count 10/12/2012   Abdominal pain, other specified site 10/09/2012   Abdominal bloating 10/09/2012   Ovarian cystic mass 05/25/2012   Shoulder pain, right 03/11/2012   Right arm pain 12/30/2011   Acute sinusitis 07/30/2010   ACNE, ROSACEA 07/17/2010   LEG PAIN 07/17/2010   NECK PAIN 05/10/2010   ABDOMINAL PAIN, EPIGASTRIC 05/10/2010   BURSITIS, LEFT HIP 05/01/2010   PLANTAR FASCIITIS, BILATERAL 05/01/2010   HYPERGLYCEMIA 01/05/2010   PRURITUS 11/27/2009   TOBACCO USE, QUIT 11/27/2009   Anxiety state 11/22/2008   FATIGUE 11/22/2008   Essential hypertension 10/07/2008   Insomnia, unspecified 08/30/2008  Obesity 08/19/2008   NAUSEA ALONE 05/02/2008   VERTIGO 01/19/2008   DIARRHEA 01/19/2008   THYROID NODULE 06/23/2007   Dyslipidemia 06/23/2007   Situational depression 06/23/2007   ADD 06/23/2007   TMJ SYNDROME 06/23/2007   GERD 06/23/2007   Irritable bowel syndrome 06/23/2007   Headache 06/23/2007    Dorene Ar, PTA 10/08/2021, 2:14 PM  [facility] [address] [place], [place], [address] Phone: [phone]   Fax:  [phone]  Name: BROCK MOKRY MRN: [id number] Date of Birth: 1961-10-31

## 2021-10-10 ENCOUNTER — Ambulatory Visit: Payer: Medicare HMO

## 2021-10-10 ENCOUNTER — Other Ambulatory Visit: Payer: Self-pay

## 2021-10-10 DIAGNOSIS — M6281 Muscle weakness (generalized): Secondary | ICD-10-CM | POA: Diagnosis not present

## 2021-10-10 DIAGNOSIS — Z7409 Other reduced mobility: Secondary | ICD-10-CM

## 2021-10-10 DIAGNOSIS — M79604 Pain in right leg: Secondary | ICD-10-CM

## 2021-10-10 DIAGNOSIS — R262 Difficulty in walking, not elsewhere classified: Secondary | ICD-10-CM

## 2021-10-10 DIAGNOSIS — U099 Post covid-19 condition, unspecified: Secondary | ICD-10-CM | POA: Diagnosis not present

## 2021-10-10 DIAGNOSIS — M79605 Pain in left leg: Secondary | ICD-10-CM | POA: Diagnosis not present

## 2021-10-10 NOTE — Therapy (Signed)
[place], [place], [address] Phone: [phone]   Fax:  [phone]  Physical Therapy Treatment  Patient Details  Name: Paula [name] MRN: [id number] Date of Birth: 1961-04-29 Referring Provider (PT): Fenton Foy, NP   Encounter Date: 10/10/2021   PT End of Session - 10/10/21 1321     Visit Number 8    Number of Visits 17    Date for PT Re-Evaluation 10/18/21    Authorization Type [employer]    Authorization Time Period 9/22-11/18/22    Authorization - Visit Number 7    Authorization - Number of Visits 16    PT Start Time 1321   patient late   PT Stop Time 1400    PT Time Calculation (min) 39 min    Activity Tolerance Patient tolerated treatment well    Behavior During Therapy [facility] for tasks assessed/performed             Past Medical History:  Diagnosis Date   Anxiety    pt stated anxiety episodes resemble stroke symptoms   Chronic lower back pain    Conversion disorder    Depression    Diverticulitis    Expressive aphasia 11/15/2016   Fibromyalgia    GERD (gastroesophageal reflux disease)    Heart murmur    High cholesterol    History of gastric ulcer    History of hiatal hernia    History of kidney stones    History of thyroid nodule    Hyperlipidemia    Hypertension    IBS (irritable bowel syndrome)    Intermittent vertigo    Migraine    "a few times/year now; maybe" (11/15/2016)   Obesity    OSA on CPAP    wears CPAP nightly ;study in epic 02-01-2014 (11/15/2016)   Osteoarthritis    "knees, hands, back, hips" (11/15/2016)   Pneumonia X 1   hx of   Psychogenic tremor    intermittant head tremor-(11/15/2016)   Refusal of blood transfusions as patient is Jehovah's Witness    RLS (restless legs syndrome)    Sleep apnea    CPAP   Urinary incontinence    Vertigo     Past Surgical History:  Procedure Laterality Date   ABDOMINAL HYSTERECTOMY  1990   ANTERIOR CERVICAL  DECOMP/DISCECTOMY FUSION  02-10-2004   C4 -- C7   BACK SURGERY     CARDIOVASCULAR STRESS TEST  2017   Negative   CESAREAN SECTION  1977; [place] W/ RETROGRADES Right 06/07/2015   Procedure: CYSTOSCOPY WITH RETROGRADE PYELOGRAM;  Surgeon: Ardis Hughs, MD;  Location: [facility];  Service: Urology;  Laterality: Right;   CYSTOSCOPY WITH URETEROSCOPY AND STENT PLACEMENT Right 06/07/2015   Procedure: RIGHT URETEROSCOPY, LASER LITHOTRIPSY, STONE REMOVAL AND RIGHT URETERAL STENT PLACEMENT;  Surgeon: Ardis Hughs, MD;  Location: [facility];  Service: Urology;  Laterality: Right;   HERNIA REPAIR     HOLMIUM LASER APPLICATION N/A 05/04/7857   Procedure: HOLMIUM LASER APPLICATION;  Surgeon: Ardis Hughs, MD;  Location: [facility];  Service: Urology;  Laterality: N/A;   LAPAROSCOPIC CHOLECYSTECTOMY  10-07-2000   LIPOMA EXCISION  01/2017   from neck   UMBILICAL HERNIA REPAIR  age 89   UPPER GI ENDOSCOPY      There were no vitals filed for this visit.   Subjective Assessment - 10/10/21 1326  Subjective Patient reports she is feeling ok today, "just some pain in the butt." She reports the pain increases with prolonged standing activity (cleaning).    Currently in Pain? Yes    Pain Score 2     Pain Location Buttocks    Pain Orientation Right;Left    Pain Descriptors / Indicators Aching    Pain Type Chronic pain    Pain Onset More than a month ago    Pain Frequency Constant    Aggravating Factors  exercise    Pain Relieving Factors tylenol                [facility] Adult PT Treatment/Exercise:   Therapeutic Exercise: - NuStep level 5 x 5 minutes  - Standing repeated lumbar extension 1 x 10  - lateral step ups 6 inch 10 reps  - sustained prone on elbows 5 minutes  - prone pressup x 3  - Step taps on 8 inch step 30 second intervals x 5       Not performed today*  - Bodyweight squats with UE  support 2 x 10  - marching on airex 1 x 10  - Calf Raise 2 x 15  - resisted hamstring curl 2 x 10 @ 20 lbs - resisted knee extension 2 x 10 @ 10 lbs - forward step ups 6 inch 1 x 10  - lateral band walks at counter 3 sets down/back green band at shins -standing hip extension with green band 10 x 2- sink stretch for lumbar in between sets -gait 185 ft x 2 , counting backwards from 30 - step ups- 30 sec x 2 - sit to stand x 20 with 5 lb kettlebell  -seated hamstring stretch 60 sec each  - Standing lumbar extension 1 x 10  - standing march 2 x 10  - standing hip abduction 2 x 10          Self-Care - education on centralization phenomenon  - education on anatomy of current condition  - updated HEP to include extension biased repeated/sustained movement                                             PT Education - 10/10/21 1508     Education Details See self-care    Person(s) Educated Patient    Methods Explanation;Demonstration    Comprehension Verbalized understanding;Returned demonstration              PT Short Term Goals - 10/08/21 1339       PT SHORT TERM GOAL #1   Title Patient will be independent with initial HEP.    Period Weeks    Status Achieved    Target Date 09/06/21      PT SHORT TERM GOAL #2   Title Patient will complete 5 x STS in less than 12 seconds to signify improvements in functional strength.    Baseline improving    Status On-going    Target Date 09/20/21      PT SHORT TERM GOAL #3   Title Patient will complete 6 MWT reporting </= 12/20 on BORG RPE to signify improved tolerance to aerobic activity.    Baseline 15/20; 10/08/21 13/20    Period Weeks    Status On-going               PT Long Term Goals -  08/23/21 1416       PT LONG TERM GOAL #1   Title Patient will be able to complete 15 reps of step ups from 8 inch step to signify ability to complete stair negotation at home.    Baseline unable to go up  and down stairs at home    Status New    Target Date 10/18/21      PT LONG TERM GOAL #2   Title Patient will demonstrate at least 4+/5 strength in bilateral assessed hip musculature to improve stability about the chain with prolonged walking.    Baseline see flowsheet    Status New    Target Date 10/18/21      PT LONG TERM GOAL #3   Title Patient will report pain at worst as </= 6/10 to signify improvements in her overall condition    Baseline at worst 10/10 with prolonged standing and walking    Status New    Target Date 10/18/21      PT LONG TERM GOAL #4   Title Patient will tolerate at least 20 minutes of continous standing activity during PT session to signify improved tolerance to household activity.    Baseline tolerates 5 minutes of standing/walking activity.    Status New    Target Date 10/18/21      PT LONG TERM GOAL #5   Title Patient will score at least 56% function on FOTO to signify clinically meaningul improvement in functional abilities.    Baseline 48%    Status New    Target Date 10/18/21                   Plan - 10/10/21 1338     Clinical Impression Statement Able to re-introduce standing ther ex during today's session as patient's chief complaint remains generalized pain with prolonged standing activity (household cleaning tasks). During step taps she reported an increase in bilateral gluteal pain that was reduced with standing repeated lumbar extension. During lateral step ups she reported onset of bilateral plantar foot pain. Trialed sustained prone on elbows for 5 minutes, which completely abolished her foot and gluteal pain. Given her favorable response to extension biased movement she is likely experience radicular pain with prolonged standing activity, so future sessions will aim to address this condition.    PT Treatment/Interventions ADLs/Self Care Home Management;Aquatic Therapy;Cryotherapy;Moist Heat;Electrical Stimulation;Gait training;Stair  training;Functional mobility training;Therapeutic activities;Therapeutic exercise;Balance training;Neuromuscular re-education;Patient/family education;Manual techniques;Passive range of motion;Dry needling;Taping    PT Next Visit Plan hip and knee strengthening, core stabilization, dual tasking as able (walking with cognitive tasks)    PT Home Exercise Plan Access Code [email or web address]    Consulted and Agree with Plan of Care Patient             Patient will benefit from skilled therapeutic intervention in order to improve the following deficits and impairments:  Difficulty walking, Obesity, Decreased endurance, Decreased activity tolerance, Pain, Impaired flexibility, Decreased balance, Postural dysfunction, Decreased strength, Decreased mobility  Visit Diagnosis: Muscle weakness (generalized)  Difficulty in walking, not elsewhere classified  COVID-19 long hauler manifesting chronic decreased mobility and endurance  Pain in both lower extremities     Problem List Patient Active Problem List   Diagnosis Date Noted   Physical deconditioning 08/22/2021   History of COVID-19 01/04/2021   Adult onset stuttering 06/30/2020   Migraine 06/30/2020   Fibromyalgia 06/30/2020   Type 2 diabetes mellitus without complication ([place]) 09/62/8366   TIA (transient ischemic attack) 06/30/2020  Atypical chest pain 08/13/2018   Depression, major 03/17/2018   Chronic pain syndrome 03/17/2018   Expressive aphasia 11/15/2016   Cough 11/08/2015   B12 deficiency 12/07/2014   Wart viral 11/07/2014   Coarse tremors 02/09/2014   OSA on CPAP 01/28/2014   Psychogenic movement disorder 01/14/2014   Dyspnea 01/12/2014   Tremor of face and hands 01/12/2014   Breast pain in female 02/09/2013   Elevated WBC count 10/12/2012   Abdominal pain, other specified site 10/09/2012   Abdominal bloating 10/09/2012   Ovarian cystic mass 05/25/2012   Shoulder pain, right 03/11/2012   Right arm pain 12/30/2011   Acute  sinusitis 07/30/2010   ACNE, ROSACEA 07/17/2010   LEG PAIN 07/17/2010   NECK PAIN 05/10/2010   ABDOMINAL PAIN, EPIGASTRIC 05/10/2010   BURSITIS, LEFT HIP 05/01/2010   PLANTAR FASCIITIS, BILATERAL 05/01/2010   HYPERGLYCEMIA 01/05/2010   PRURITUS 11/27/2009   TOBACCO USE, QUIT 11/27/2009   Anxiety state 11/22/2008   FATIGUE 11/22/2008   Essential hypertension 10/07/2008   Insomnia, unspecified 08/30/2008   Obesity 08/19/2008   NAUSEA ALONE 05/02/2008   VERTIGO 01/19/2008   DIARRHEA 01/19/2008   THYROID NODULE 06/23/2007   Dyslipidemia 06/23/2007   Situational depression 06/23/2007   ADD 06/23/2007   TMJ SYNDROME 06/23/2007   GERD 06/23/2007   Irritable bowel syndrome 06/23/2007   Headache 06/23/2007   Gwendolyn Grant, PT, DPT, ATC 10/10/21 3:13 PM   [facility] [address] [place], [place], [address] Phone: [phone]   Fax:  [phone]  Name: Paula [name] MRN: [id number] Date of Birth: [birth year]

## 2021-10-15 ENCOUNTER — Ambulatory Visit: Payer: Medicare HMO

## 2021-10-15 ENCOUNTER — Other Ambulatory Visit: Payer: Self-pay

## 2021-10-15 DIAGNOSIS — R262 Difficulty in walking, not elsewhere classified: Secondary | ICD-10-CM | POA: Diagnosis not present

## 2021-10-15 DIAGNOSIS — Z7409 Other reduced mobility: Secondary | ICD-10-CM

## 2021-10-15 DIAGNOSIS — M6281 Muscle weakness (generalized): Secondary | ICD-10-CM | POA: Diagnosis not present

## 2021-10-15 DIAGNOSIS — M79604 Pain in right leg: Secondary | ICD-10-CM

## 2021-10-15 DIAGNOSIS — M79605 Pain in left leg: Secondary | ICD-10-CM | POA: Diagnosis not present

## 2021-10-15 DIAGNOSIS — U099 Post covid-19 condition, unspecified: Secondary | ICD-10-CM | POA: Diagnosis not present

## 2021-10-15 NOTE — Therapy (Signed)
[facility] [place], [place], [address] Phone: [phone]   Fax:  [phone]  Physical Therapy Treatment  Patient Details  Name: Paula [name] MRN: [id number] Date of Birth: September 24, 1961 Referring Provider (PT): Fenton Foy, NP   Encounter Date: 10/15/2021   PT End of Session - 10/15/21 1317     Visit Number 9    Number of Visits 17    Date for PT Re-Evaluation 10/18/21    Authorization Type [employer]    Authorization Time Period 9/22-11/18/22    Authorization - Visit Number 8    Authorization - Number of Visits 16    PT Start Time 1330    PT Stop Time 1414    PT Time Calculation (min) 44 min    Activity Tolerance Patient tolerated treatment well    Behavior During Therapy [facility] for tasks assessed/performed             Past Medical History:  Diagnosis Date   Anxiety    pt stated anxiety episodes resemble stroke symptoms   Chronic lower back pain    Conversion disorder    Depression    Diverticulitis    Expressive aphasia 11/15/2016   Fibromyalgia    GERD (gastroesophageal reflux disease)    Heart murmur    High cholesterol    History of gastric ulcer    History of hiatal hernia    History of kidney stones    History of thyroid nodule    Hyperlipidemia    Hypertension    IBS (irritable bowel syndrome)    Intermittent vertigo    Migraine    "a few times/year now; maybe" (11/15/2016)   Obesity    OSA on CPAP    wears CPAP nightly ;study in epic 02-01-2014 (11/15/2016)   Osteoarthritis    "knees, hands, back, hips" (11/15/2016)   Pneumonia X 1   hx of   Psychogenic tremor    intermittant head tremor-(11/15/2016)   Refusal of blood transfusions as patient is Jehovah's Witness    RLS (restless legs syndrome)    Sleep apnea    CPAP   Urinary incontinence    Vertigo     Past Surgical History:  Procedure Laterality Date   ABDOMINAL HYSTERECTOMY  1990   ANTERIOR CERVICAL  DECOMP/DISCECTOMY FUSION  02-10-2004   C4 -- C7   BACK SURGERY     CARDIOVASCULAR STRESS TEST  2017   Negative   CESAREAN SECTION  1977; [place] W/ RETROGRADES Right 06/07/2015   Procedure: CYSTOSCOPY WITH RETROGRADE PYELOGRAM;  Surgeon: Ardis Hughs, MD;  Location: [facility];  Service: Urology;  Laterality: Right;   CYSTOSCOPY WITH URETEROSCOPY AND STENT PLACEMENT Right 06/07/2015   Procedure: RIGHT URETEROSCOPY, LASER LITHOTRIPSY, STONE REMOVAL AND RIGHT URETERAL STENT PLACEMENT;  Surgeon: Ardis Hughs, MD;  Location: [facility];  Service: Urology;  Laterality: Right;   HERNIA REPAIR     HOLMIUM LASER APPLICATION N/A 04/02/7781   Procedure: HOLMIUM LASER APPLICATION;  Surgeon: Ardis Hughs, MD;  Location: [facility];  Service: Urology;  Laterality: N/A;   LAPAROSCOPIC CHOLECYSTECTOMY  10-07-2000   LIPOMA EXCISION  01/2017   from neck   UMBILICAL HERNIA REPAIR  age 29   UPPER GI ENDOSCOPY      There were no vitals filed for this visit.   Subjective Assessment - 10/15/21 1334     Subjective "I'm  ok." She reports having cramps in her legs after last session. She does feel that she can be on her feet a little bit more and attributes this to utilizing repeated movement.    Currently in Pain? Yes    Pain Score 2     Pain Location Back    Pain Orientation Right;Left;Lower    Pain Descriptors / Indicators Aching    Pain Type Chronic pain    Pain Onset More than a month ago    Pain Frequency Constant                [facility] Adult PT Treatment/Exercise:   Therapeutic Exercise: - Standing repeated lumbar extension 1 x 10  - Supine posterior pelvic tilt 2 x 10; 5 sec hold; heavy cues - Supine march with posterior pelvic tilt 2 x 10; heavy cues - Hip bridge 2 x 10   Not performed today*  - lateral step ups 6 inch 10 reps  - sustained prone on elbows 5 minutes  - prone pressup x 3  - Step  taps on 8 inch step 30 second intervals x 5  - Bodyweight squats with UE support 2 x 10  - marching on airex 1 x 10  - Calf Raise 2 x 15  - resisted hamstring curl 2 x 10 @ 20 lbs - resisted knee extension 2 x 10 @ 10 lbs - forward step ups 6 inch 1 x 10  - lateral band walks at counter 3 sets down/back green band at shins -standing hip extension with green band 10 x 2- sink stretch for lumbar in between sets -gait 185 ft x 2 , counting backwards from 30 - step ups- 30 sec x 2 - sit to stand x 20 with 5 lb kettlebell  -seated hamstring stretch 60 sec each  - Standing lumbar extension 1 x 10  - standing march 2 x 10  - standing hip abduction 2 x 10          Self-Care - education on nutritional considerations in regards to her GI discomfort and weight with recommendations to avoid excessive night time eating and to discuss need for potential referral to nutritionist from her physician.                                         PT Short Term Goals - 10/08/21 1339       PT SHORT TERM GOAL #1   Title Patient will be independent with initial HEP.    Period Weeks    Status Achieved    Target Date 09/06/21      PT SHORT TERM GOAL #2   Title Patient will complete 5 x STS in less than 12 seconds to signify improvements in functional strength.    Baseline improving    Status On-going    Target Date 09/20/21      PT SHORT TERM GOAL #3   Title Patient will complete 6 MWT reporting </= 12/20 on BORG RPE to signify improved tolerance to aerobic activity.    Baseline 15/20; 10/08/21 13/20    Period Weeks    Status On-going               PT Long Term Goals - 08/23/21 1416       PT LONG TERM GOAL #1   Title Patient will be able to complete 15 reps  of step ups from 8 inch step to signify ability to complete stair negotation at home.    Baseline unable to go up and down stairs at home    Status New    Target Date 10/18/21      PT LONG TERM GOAL #2    Title Patient will demonstrate at least 4+/5 strength in bilateral assessed hip musculature to improve stability about the chain with prolonged walking.    Baseline see flowsheet    Status New    Target Date 10/18/21      PT LONG TERM GOAL #3   Title Patient will report pain at worst as </= 6/10 to signify improvements in her overall condition    Baseline at worst 10/10 with prolonged standing and walking    Status New    Target Date 10/18/21      PT LONG TERM GOAL #4   Title Patient will tolerate at least 20 minutes of continous standing activity during PT session to signify improved tolerance to household activity.    Baseline tolerates 5 minutes of standing/walking activity.    Status New    Target Date 10/18/21      PT LONG TERM GOAL #5   Title Patient will score at least 56% function on FOTO to signify clinically meaningul improvement in functional abilities.    Baseline 48%    Status New    Target Date 10/18/21                   Plan - 10/15/21 1345     Clinical Impression Statement Today's session focused on lumbopelvic stabilization as her LE pain appears to be radicular in nature. She is challenged with maintaining neutral spine position with dynamic core stabilization requiring heavy cues for proper positioning. During first set she reported feeling more activation in her back and hips with supine marching, though with cueing and continued practice she is able to properly engage her core and reports feeling core activation herself. She reported muscle fatigue at end of session, though no increased pain.    PT Treatment/Interventions ADLs/Self Care Home Management;Aquatic Therapy;Cryotherapy;Moist Heat;Electrical Stimulation;Gait training;Stair training;Functional mobility training;Therapeutic activities;Therapeutic exercise;Balance training;Neuromuscular re-education;Patient/family education;Manual techniques;Passive range of motion;Dry needling;Taping    PT Next Visit  Plan hip and knee strengthening, core stabilization, dual tasking as able (walking with cognitive tasks)    PT Home Exercise Plan Access Code [email or web address]    Consulted and Agree with Plan of Care Patient             Patient will benefit from skilled therapeutic intervention in order to improve the following deficits and impairments:  Difficulty walking, Obesity, Decreased endurance, Decreased activity tolerance, Pain, Impaired flexibility, Decreased balance, Postural dysfunction, Decreased strength, Decreased mobility  Visit Diagnosis: Muscle weakness (generalized)  Difficulty in walking, not elsewhere classified  COVID-19 long hauler manifesting chronic decreased mobility and endurance  Pain in both lower extremities     Problem List Patient Active Problem List   Diagnosis Date Noted   Physical deconditioning 08/22/2021   History of COVID-19 01/04/2021   Adult onset stuttering 06/30/2020   Migraine 06/30/2020   Fibromyalgia 06/30/2020   Type 2 diabetes mellitus without complication ([place]) 13/24/4010   TIA (transient ischemic attack) 06/30/2020   Atypical chest pain 08/13/2018   Depression, major 03/17/2018   Chronic pain syndrome 03/17/2018   Expressive aphasia 11/15/2016   Cough 11/08/2015   B12 deficiency 12/07/2014   Wart viral 11/07/2014   Coarse tremors 02/09/2014  OSA on CPAP 01/28/2014   Psychogenic movement disorder 01/14/2014   Dyspnea 01/12/2014   Tremor of face and hands 01/12/2014   Breast pain in female 02/09/2013   Elevated WBC count 10/12/2012   Abdominal pain, other specified site 10/09/2012   Abdominal bloating 10/09/2012   Ovarian cystic mass 05/25/2012   Shoulder pain, right 03/11/2012   Right arm pain 12/30/2011   Acute sinusitis 07/30/2010   ACNE, ROSACEA 07/17/2010   LEG PAIN 07/17/2010   NECK PAIN 05/10/2010   ABDOMINAL PAIN, EPIGASTRIC 05/10/2010   BURSITIS, LEFT HIP 05/01/2010   PLANTAR FASCIITIS, BILATERAL 05/01/2010   HYPERGLYCEMIA  01/05/2010   PRURITUS 11/27/2009   TOBACCO USE, QUIT 11/27/2009   Anxiety state 11/22/2008   FATIGUE 11/22/2008   Essential hypertension 10/07/2008   Insomnia, unspecified 08/30/2008   Obesity 08/19/2008   NAUSEA ALONE 05/02/2008   VERTIGO 01/19/2008   DIARRHEA 01/19/2008   THYROID NODULE 06/23/2007   Dyslipidemia 06/23/2007   Situational depression 06/23/2007   ADD 06/23/2007   TMJ SYNDROME 06/23/2007   GERD 06/23/2007   Irritable bowel syndrome 06/23/2007   Headache 06/23/2007   Gwendolyn Grant, PT, DPT, ATC 10/15/21 3:35 PM   [facility] [address] [place], [place], [address] Phone: [phone]   Fax:  [phone]  Name: TAMICKA SHIMON MRN: [id number] Date of Birth: 07-18-1961

## 2021-10-17 ENCOUNTER — Other Ambulatory Visit: Payer: Self-pay | Admitting: Family Medicine

## 2021-10-17 ENCOUNTER — Other Ambulatory Visit: Payer: Self-pay

## 2021-10-17 ENCOUNTER — Ambulatory Visit: Payer: Medicare HMO

## 2021-10-17 DIAGNOSIS — M79604 Pain in right leg: Secondary | ICD-10-CM | POA: Diagnosis not present

## 2021-10-17 DIAGNOSIS — M6281 Muscle weakness (generalized): Secondary | ICD-10-CM | POA: Diagnosis not present

## 2021-10-17 DIAGNOSIS — Z7409 Other reduced mobility: Secondary | ICD-10-CM | POA: Diagnosis not present

## 2021-10-17 DIAGNOSIS — U099 Post covid-19 condition, unspecified: Secondary | ICD-10-CM | POA: Diagnosis not present

## 2021-10-17 DIAGNOSIS — M79605 Pain in left leg: Secondary | ICD-10-CM

## 2021-10-17 DIAGNOSIS — Z1231 Encounter for screening mammogram for malignant neoplasm of breast: Secondary | ICD-10-CM

## 2021-10-17 DIAGNOSIS — R262 Difficulty in walking, not elsewhere classified: Secondary | ICD-10-CM

## 2021-10-17 NOTE — Therapy (Signed)
[facility], [place], [address] Phone: [phone]   Fax:  [phone]  Physical Therapy Treatment/Progress Note/Discharge Progress Note Reporting Period 08/23/21 to 10/17/21  See note below for Objective Data and Assessment of Progress/Goals.      Patient Details  Name: Paula [name] MRN: [id number] Date of Birth: [birth year] Referring Provider (PT): Fenton Foy, NP   Encounter Date: 10/17/2021   PT End of Session - 10/17/21 1322     Visit Number 10    Number of Visits 17    Date for PT Re-Evaluation 10/18/21    Authorization Type [employer]    Authorization Time Period 9/22-11/18/22    Authorization - Visit Number 9    Authorization - Number of Visits 16    PT Start Time 5379    PT Stop Time 1400    PT Time Calculation (min) 38 min    Activity Tolerance Patient tolerated treatment well    Behavior During Therapy WFL for tasks assessed/performed             Past Medical History:  Diagnosis Date   Anxiety    pt stated anxiety episodes resemble stroke symptoms   Chronic lower back pain    Conversion disorder    Depression    Diverticulitis    Expressive aphasia 11/15/2016   Fibromyalgia    GERD (gastroesophageal reflux disease)    Heart murmur    High cholesterol    History of gastric ulcer    History of hiatal hernia    History of kidney stones    History of thyroid nodule    Hyperlipidemia    Hypertension    IBS (irritable bowel syndrome)    Intermittent vertigo    Migraine    "a few times/year now; maybe" (11/15/2016)   Obesity    OSA on CPAP    wears CPAP nightly ;study in epic 02-01-2014 (11/15/2016)   Osteoarthritis    "knees, hands, back, hips" (11/15/2016)   Pneumonia X 1   hx of   Psychogenic tremor    intermittant head tremor-(11/15/2016)   Refusal of blood transfusions as patient is Jehovah's Witness    RLS (restless legs syndrome)    Sleep apnea    CPAP    Urinary incontinence    Vertigo     Past Surgical History:  Procedure Laterality Date   ABDOMINAL HYSTERECTOMY  1990   ANTERIOR CERVICAL DECOMP/DISCECTOMY FUSION  02-10-2004   C4 -- C7   BACK SURGERY     CARDIOVASCULAR STRESS TEST  2017   Negative   CESAREAN SECTION  1977; 1982   COLONOSCOPY     CYSTOSCOPY W/ RETROGRADES Right 06/07/2015   Procedure: CYSTOSCOPY WITH RETROGRADE PYELOGRAM;  Surgeon: Ardis Hughs, MD;  Location: [facility];  Service: Urology;  Laterality: Right;   CYSTOSCOPY WITH URETEROSCOPY AND STENT PLACEMENT Right 06/07/2015   Procedure: RIGHT URETEROSCOPY, LASER LITHOTRIPSY, STONE REMOVAL AND RIGHT URETERAL STENT PLACEMENT;  Surgeon: Ardis Hughs, MD;  Location: [facility];  Service: Urology;  Laterality: Right;   HERNIA REPAIR     HOLMIUM LASER APPLICATION N/A 03/04/2760   Procedure: HOLMIUM LASER APPLICATION;  Surgeon: Ardis Hughs, MD;  Location: [facility];  Service: Urology;  Laterality: N/A;   LAPAROSCOPIC CHOLECYSTECTOMY  10-07-2000   LIPOMA EXCISION  01/2017   from neck   UMBILICAL HERNIA REPAIR  age 79   UPPER GI ENDOSCOPY  There were no vitals filed for this visit.   Subjective Assessment - 10/17/21 1323     Subjective Patient would like today to be her last day as she is undergoing unrelated surgery later this month. She feels overall her back/leg pain have improved. She is still getting some foot pain, but it's not as bad as it was.    How long can you sit comfortably? sitting is ok    How long can you stand comfortably? 20-30 minutes    How long can you walk comfortably? 20-30 minutes    Currently in Pain? Yes    Pain Score 3     Pain Location Foot    Pain Orientation Left    Pain Descriptors / Indicators Dull;Stabbing    Pain Type Chronic pain    Pain Onset More than a month ago    Pain Frequency Intermittent                [facility] PT Assessment - 10/17/21 0001        Observation/Other Assessments   Focus on Therapeutic Outcomes (FOTO)  66%      Strength   Right Hip Flexion 5/5    Right Hip ABduction 4+/5    Left Hip Flexion 5/5    Left Hip ABduction 4+/5    Right Knee Flexion 5/5    Right Knee Extension 5/5    Left Knee Flexion 5/5    Left Knee Extension 5/5      Transfers   Five time sit to stand comments  10.8      6 Minute walk- Post Test   Perceived Rate of Exertion (Borg) 13- Somewhat hard      6 minute walk test results    Aerobic Endurance Distance Walked 1384    Endurance additional comments 4/10 hip pain               [facility] Adult PT Treatment/Exercise:  Therapeutic Exercise: - 6 minute walk - posterior pelvic tilt 1 x 10 - supine march 2 x 10 - hip bridge with posterior pelvic tilt 2 x 10 - reviewed and updated advanced HEP  Manual Therapy: - n/a  Neuromuscular re-ed: - n/a  Therapeutic Activity: - n/a  Self-care/Home Management: - Education on progress towards goals, FOTO score, D/C education, discussed walking program for cardiovascular endurance.                       PT Education - 10/17/21 1416     Education Details See self-care    Person(s) Educated Patient    Methods Explanation    Comprehension Verbalized understanding              PT Short Term Goals - 10/17/21 1331       PT SHORT TERM GOAL #1   Title Patient will be independent with initial HEP.    Period Weeks    Status Achieved    Target Date 09/06/21      PT SHORT TERM GOAL #2   Title Patient will complete 5 x STS in less than 12 seconds to signify improvements in functional strength.    Baseline see flowsheet    Status Achieved    Target Date 09/20/21      PT SHORT TERM GOAL #3   Title Patient will complete 6 MWT reporting </= 12/20 on BORG RPE to signify improved tolerance to aerobic activity.    Baseline 15/20; 11/16 13/20  Period Weeks    Status On-going               PT Long Term Goals -  10/17/21 1332       PT LONG TERM GOAL #1   Title Patient will be able to complete 15 reps of step ups from 8 inch step to signify ability to complete stair negotation at home.    Baseline achieved, has been able to complete sets/reps of step ups during treatment sessions    Status Achieved      PT LONG TERM GOAL #2   Title Patient will demonstrate at least 4+/5 strength in bilateral assessed hip musculature to improve stability about the chain with prolonged walking.    Baseline see flowsheet    Status Achieved      PT LONG TERM GOAL #3   Title Patient will report pain at worst as </= 6/10 to signify improvements in her overall condition    Baseline pain as 3/10 11/16    Status Achieved      PT LONG TERM GOAL #4   Title Patient will tolerate at least 20 minutes of continous standing activity during PT session to signify improved tolerance to household activity.    Baseline tolerates 5 minutes of standing/walking activity.; 20-30 minutes 11/16    Status Achieved      PT LONG TERM GOAL #5   Title Patient will score at least 56% function on FOTO to signify clinically meaningul improvement in functional abilities.    Baseline see flowsheet    Status Achieved                   Plan - 10/17/21 1334     Clinical Impression Statement Mayerli has made excellent functional progress since the start of care having met the majority of her established functional goals. She demonstrates improvements in her hip strength, tolerance to walking/standing activity, increased endurance as noted through 6 MWT, and overall reduction in her pain. She is requesting discharge at this time due to an unrelated scheduled surgery later this month, but is also appropriate for discharge given her overall subjective/objective improvements in function since the start of care. She demonstrates independence with advanced HEP and is appropriate for discharge at this time.    PT Treatment/Interventions ADLs/Self  Care Home Management;Aquatic Therapy;Cryotherapy;Moist Heat;Electrical Stimulation;Gait training;Stair training;Functional mobility training;Therapeutic activities;Therapeutic exercise;Balance training;Neuromuscular re-education;Patient/family education;Manual techniques;Passive range of motion;Dry needling;Taping    PT Next Visit Plan --    PT Home Exercise Plan Access Code [email or web address]    Consulted and Agree with Plan of Care Patient             Patient will benefit from skilled therapeutic intervention in order to improve the following deficits and impairments:  Difficulty walking, Obesity, Decreased endurance, Decreased activity tolerance, Pain, Impaired flexibility, Decreased balance, Postural dysfunction, Decreased strength, Decreased mobility  Visit Diagnosis: Muscle weakness (generalized)  Difficulty in walking, not elsewhere classified  COVID-19 long hauler manifesting chronic decreased mobility and endurance  Pain in both lower extremities     Problem List Patient Active Problem List   Diagnosis Date Noted   Physical deconditioning 08/22/2021   History of COVID-19 01/04/2021   Adult onset stuttering 06/30/2020   Migraine 06/30/2020   Fibromyalgia 06/30/2020   Type 2 diabetes mellitus without complication ([place]) 02/72/5366   TIA (transient ischemic attack) 06/30/2020   Atypical chest pain 08/13/2018   Depression, major 03/17/2018   Chronic pain syndrome 03/17/2018   Expressive  aphasia 11/15/2016   Cough 11/08/2015   B12 deficiency 12/07/2014   Wart viral 11/07/2014   Coarse tremors 02/09/2014   OSA on CPAP 01/28/2014   Psychogenic movement disorder 01/14/2014   Dyspnea 01/12/2014   Tremor of face and hands 01/12/2014   Breast pain in female 02/09/2013   Elevated WBC count 10/12/2012   Abdominal pain, other specified site 10/09/2012   Abdominal bloating 10/09/2012   Ovarian cystic mass 05/25/2012   Shoulder pain, right 03/11/2012   Right arm pain 12/30/2011    Acute sinusitis 07/30/2010   ACNE, ROSACEA 07/17/2010   LEG PAIN 07/17/2010   NECK PAIN 05/10/2010   ABDOMINAL PAIN, EPIGASTRIC 05/10/2010   BURSITIS, LEFT HIP 05/01/2010   PLANTAR FASCIITIS, BILATERAL 05/01/2010   HYPERGLYCEMIA 01/05/2010   PRURITUS 11/27/2009   TOBACCO USE, QUIT 11/27/2009   Anxiety state 11/22/2008   FATIGUE 11/22/2008   Essential hypertension 10/07/2008   Insomnia, unspecified 08/30/2008   Obesity 08/19/2008   NAUSEA ALONE 05/02/2008   VERTIGO 01/19/2008   DIARRHEA 01/19/2008   THYROID NODULE 06/23/2007   Dyslipidemia 06/23/2007   Situational depression 06/23/2007   ADD 06/23/2007   TMJ SYNDROME 06/23/2007   GERD 06/23/2007   Irritable bowel syndrome 06/23/2007   Headache 06/23/2007  PHYSICAL THERAPY DISCHARGE SUMMARY  Visits from Start of Care: 10  Current functional level related to goals / functional outcomes: See goals above   Remaining deficits: See impression above   Education / Equipment: See education above    Patient agrees to discharge. Patient goals were partially met. Patient is being discharged due to the patient's request.  Gwendolyn Grant, PT, DPT, ATC 10/17/21 2:18 PM   [facility] [address] [place], [place], [address] Phone: [phone]   Fax:  [phone]  Name: Paula [name] MRN: [id number] Date of Birth: 22-Dec-1960

## 2021-10-18 ENCOUNTER — Ambulatory Visit (INDEPENDENT_AMBULATORY_CARE_PROVIDER_SITE_OTHER): Payer: Medicare HMO | Admitting: Nurse Practitioner

## 2021-10-18 ENCOUNTER — Encounter: Payer: Self-pay | Admitting: Nurse Practitioner

## 2021-10-18 VITALS — BP 149/81 | HR 71 | Temp 98.2°F | Resp 18 | Ht 64.0 in | Wt 259.0 lb

## 2021-10-18 DIAGNOSIS — Z8616 Personal history of COVID-19: Secondary | ICD-10-CM | POA: Diagnosis not present

## 2021-10-18 NOTE — Progress Notes (Signed)
Patient has had coffee with cream and sugar. Patient has not taken medication today. Patient denies pain/HA at this time. Patient denies N/V/D or fevers.

## 2021-10-18 NOTE — Patient Instructions (Addendum)
History of Covid 19 Fatigue Hip, leg, joint pain Shortness of breath:   Glad you are improving!   Stay well hydrated   Stay active   May take tylenol or fever or pain   Continue physical therapy exercises at home   May take Flexeril - patient has prescription at home   Headache:   Magnesium 600 mg daily   Riboflavin 400 mg daily   Follow with PCP for elevated BP       Follow up:   Follow up if needed

## 2021-10-18 NOTE — Progress Notes (Signed)
@Patient  ID: Paula [name], female    DOB: [birth year], 60 y.o.   MRN: [id number]  Chief Complaint  Patient presents with   Follow-up    Post COVID    Referring provider: Ephriam Jenkins, PA    HPI  Patient presents today for post-COVID care visit.  Patient was last seen by me on 08/22/2021.  Patient tested positive for COVID in June 2022. She previously complained of ongoing extreme fatigue, bilateral lower extremity pain, headaches, shortness of breath and weight gain due to inactivity.  Patient was referred to physical therapy and recently just completed her visits with them.  She states that this was very helpful.  She no longer has the previous symptoms that she was experiencing.  She continues to try to lose weight.  She states that her headaches are less frequent now.  She did not try magnesium and riboflavin as advised and will start that now.  Her blood pressure was slightly elevated today.  She does have a complete physical scheduled in 2 weeks with her PCP and we will address this at that visit. Denies f/c/s, n/v/d, hemoptysis, PND, chest pain or edema.        Allergies  Allergen Reactions   Detrol [Tolterodine] Other (See Comments)    slurred speech, tremors, dizziness   Gabapentin Palpitations and Other (See Comments)    Wt gain, tremor   Aspirin Other (See Comments)    Avoids--- history of gastric ulcers    Contrast Media [Iodinated Diagnostic Agents] Hives, Itching and Rash    CT contrast-Needed to take benadryl    Hydrocodone-Acetaminophen Nausea And Vomiting   Imitrex [Sumatriptan] Other (See Comments)    Body hurts   Oxycodone Nausea And Vomiting   Vicodin Hp [Hydrocodone-Acetaminophen] Nausea And Vomiting   Avelox [Moxifloxacin] Itching    Immunization History  Administered Date(s) Administered   Influenza,inj,Quad PF,6+ Mos 09/20/2014   PFIZER(Purple Top)SARS-COV-2 Vaccination 02/16/2020, 03/09/2020, 09/12/2020   Tdap 12/30/2011    Past Medical  History:  Diagnosis Date   Anxiety    pt stated anxiety episodes resemble stroke symptoms   Chronic lower back pain    Conversion disorder    Depression    Diverticulitis    Expressive aphasia 11/15/2016   Fibromyalgia    GERD (gastroesophageal reflux disease)    Heart murmur    High cholesterol    History of gastric ulcer    History of hiatal hernia    History of kidney stones    History of thyroid nodule    Hyperlipidemia    Hypertension    IBS (irritable bowel syndrome)    Intermittent vertigo    Migraine    "a few times/year now; maybe" (11/15/2016)   Obesity    OSA on CPAP    wears CPAP nightly ;study in epic 02-01-2014 (11/15/2016)   Osteoarthritis    "knees, hands, back, hips" (11/15/2016)   Pneumonia X 1   hx of   Psychogenic tremor    intermittant head tremor-(11/15/2016)   Refusal of blood transfusions as patient is Jehovah's Witness    RLS (restless legs syndrome)    Sleep apnea    CPAP   Urinary incontinence    Vertigo     Tobacco History: Social History   Tobacco Use  Smoking Status Former   Packs/day: 0.50   Years: 25.00   Pack years: 12.50   Types: Cigarettes   Quit date: 07/24/2008   Years since quitting: 13.2  Smokeless Tobacco Never  Counseling given: Yes   Outpatient Encounter Medications as of 10/18/2021  Medication Sig   acetaminophen (TYLENOL) 500 MG tablet Take 1,000 mg by mouth every 6 (six) hours as needed for headache (pain).   albuterol (VENTOLIN HFA) 108 (90 Base) MCG/ACT inhaler as needed.   Cholecalciferol (VITAMIN D3 PO) Take 1 tablet by mouth at bedtime.   Cyanocobalamin (VITAMIN B 12 PO)    cyclobenzaprine (FLEXERIL) 10 MG tablet Take 1 tablet by mouth at bedtime.   dexlansoprazole (DEXILANT) 60 MG capsule Take 1 capsule by mouth daily.   dicyclomine (BENTYL) 20 MG tablet Take 1 tablet by mouth in the morning, at noon, in the evening, and at bedtime.   famotidine (PEPCID) 20 MG tablet Take 1 tablet (20 mg total) by  mouth at bedtime.   Peppermint Oil (IBGARD) 90 MG CPCR Take 2 capsules by mouth 4 (four) times daily -  before meals and at bedtime.   SYMBICORT 160-4.5 MCG/ACT inhaler Inhale 1 puff into the lungs as needed.   vitamin E 1000 UNIT capsule Take 1,000 Units by mouth at bedtime.   Facility-Administered Encounter Medications as of 10/18/2021  Medication   0.9 %  sodium chloride infusion     Review of Systems  Review of Systems  Constitutional: Negative.   HENT: Negative.    Cardiovascular: Negative.   Gastrointestinal: Negative.   Allergic/Immunologic: Negative.   Neurological: Negative.   Psychiatric/Behavioral: Negative.        Physical Exam  BP (!) 149/81 (BP Location: Left Arm, Patient Position: Sitting, Cuff Size: Large)   Pulse 71   Temp 98.2 F (36.8 C) (Oral)   Resp 18   Ht 5\' 4"  (1.626 m)   Wt 259 lb (117.5 kg)   SpO2 97%   BMI 44.46 kg/m   Wt Readings from Last 5 Encounters:  10/18/21 259 lb (117.5 kg)  10/05/21 260 lb 4 oz (118 kg)  09/25/21 259 lb (117.5 kg)  09/14/21 259 lb 0.7 oz (117.5 kg)  09/13/21 259 lb (117.5 kg)     Physical Exam Vitals and nursing note reviewed.  Constitutional:      General: She is not in acute distress.    Appearance: She is well-developed.  Cardiovascular:     Rate and Rhythm: Normal rate and regular rhythm.  Pulmonary:     Effort: Pulmonary effort is normal.     Breath sounds: Normal breath sounds.  Neurological:     Mental Status: She is alert and oriented to person, place, and time.      Assessment & Plan:   History of COVID-19 Fatigue Hip, leg, joint pain Shortness of breath:   Glad you are improving!   Stay well hydrated   Stay active   May take tylenol or fever or pain   Continue physical therapy exercises at home   May take Flexeril - patient has prescription at home   Headache:   Magnesium 600 mg daily   Riboflavin 400 mg daily   Follow with PCP for elevated BP       Follow up:    Follow up if needed       Fenton Foy, NP 10/18/2021

## 2021-10-18 NOTE — Assessment & Plan Note (Signed)
Fatigue Hip, leg, joint pain Shortness of breath:   Glad you are improving!   Stay well hydrated   Stay active   May take tylenol or fever or pain   Continue physical therapy exercises at home   May take Flexeril - patient has prescription at home   Headache:   Magnesium 600 mg daily   Riboflavin 400 mg daily   Follow with PCP for elevated BP       Follow up:   Follow up if needed

## 2021-10-24 ENCOUNTER — Encounter (HOSPITAL_COMMUNITY): Payer: Self-pay | Admitting: Otolaryngology

## 2021-10-24 ENCOUNTER — Other Ambulatory Visit: Payer: Self-pay

## 2021-10-24 DIAGNOSIS — J4520 Mild intermittent asthma, uncomplicated: Secondary | ICD-10-CM | POA: Diagnosis not present

## 2021-10-24 DIAGNOSIS — E782 Mixed hyperlipidemia: Secondary | ICD-10-CM | POA: Diagnosis not present

## 2021-10-24 DIAGNOSIS — F411 Generalized anxiety disorder: Secondary | ICD-10-CM | POA: Diagnosis not present

## 2021-10-24 DIAGNOSIS — Z0000 Encounter for general adult medical examination without abnormal findings: Secondary | ICD-10-CM | POA: Diagnosis not present

## 2021-10-24 DIAGNOSIS — Z1389 Encounter for screening for other disorder: Secondary | ICD-10-CM | POA: Diagnosis not present

## 2021-10-24 DIAGNOSIS — K588 Other irritable bowel syndrome: Secondary | ICD-10-CM | POA: Diagnosis not present

## 2021-10-24 DIAGNOSIS — K219 Gastro-esophageal reflux disease without esophagitis: Secondary | ICD-10-CM | POA: Diagnosis not present

## 2021-10-24 DIAGNOSIS — R7303 Prediabetes: Secondary | ICD-10-CM | POA: Diagnosis not present

## 2021-10-24 DIAGNOSIS — R251 Tremor, unspecified: Secondary | ICD-10-CM | POA: Diagnosis not present

## 2021-10-24 NOTE — Progress Notes (Signed)
PCP - Dr. Iline Oven  Cardiologist - Dr. Percival Spanish- has not had to see in 3-4 years  EP- Denies  Endocrine-Denies  Pulm-Denies  Chest x-ray - 04/24/21 (E)  EKG - 09/14/21 (E)  Stress Test - Denies  ECHO - 07/01/20 (E)  Cardiac Cath - Denies  AICD-na PM-na LOOP-na  Dialysis-Denies  Sleep Study - Yes- Positive CPAP - Yes- will bring day of surgery  LABS- 10/29/21: CBC, BMP, COVID  ASA-Denies  ERAS-No  HA1C-Denies  Anesthesia- No  Pt denies having chest pain, sob, or fever during the pre-op phone call. All instructions explained to the pt, with a verbal understanding of the material including: as of today, stop taking all Aspirin (unless instructed by your doctor) and Other Aspirin containing products, Vitamins, Fish oils, and Herbal medications. Also stop all NSAIDS i.e. Advil, Ibuprofen, Motrin, Aleve, Anaprox, Naproxen, BC, Goody Powders, and all Supplements.  Pt also instructed to wear a mask and social distance if she goes out. The opportunity to ask questions was provided.    Coronavirus Screening  Have you experienced the following symptoms:  Cough yes/no: No Fever (>100.47F)  yes/no: No Runny nose yes/no: No Sore throat yes/no: No Difficulty breathing/shortness of breath  yes/no: No  Have you or a family member traveled in the last 14 days and where? yes/no: No   If the patient indicates "YES" to the above questions, their PAT will be rescheduled to limit the exposure to others and, the surgeon will be notified. THE PATIENT WILL NEED TO BE ASYMPTOMATIC FOR 14 DAYS.   If the patient is not experiencing any of these symptoms, the PAT nurse will instruct them to NOT bring anyone with them to their appointment since they may have these symptoms or traveled as well.   Please remind your patients and families that hospital visitation restrictions are in effect and the importance of the restrictions.

## 2021-10-28 NOTE — Anesthesia Preprocedure Evaluation (Addendum)
Anesthesia Evaluation  Patient identified by MRN, date of birth, ID band Patient awake  General Assessment Comment:Hypopharyngeal lesion   Reviewed: Allergy & Precautions, NPO status , Patient's Chart, lab work & pertinent test results  Airway Mallampati: III  TM Distance: >3 FB Neck ROM: Full    Dental  (+) Teeth Intact, Dental Advisory Given   Pulmonary sleep apnea and Continuous Positive Airway Pressure Ventilation , former smoker,    Pulmonary exam normal breath sounds clear to auscultation       Cardiovascular hypertension, Normal cardiovascular exam Rhythm:Regular Rate:Normal     Neuro/Psych  Headaches, PSYCHIATRIC DISORDERS Anxiety Depression Psychogenic tremor TIA Neuromuscular disease    GI/Hepatic Neg liver ROS, hiatal hernia, GERD  Medicated,  Endo/Other  diabetesMorbid obesity  Renal/GU negative Renal ROS     Musculoskeletal  (+) Arthritis , Fibromyalgia -  Abdominal   Peds  Hematology  (+) REFUSES BLOOD PRODUCTS, JEHOVAH'S WITNESS  Anesthesia Other Findings Day of surgery medications reviewed with the patient.  Reproductive/Obstetrics                            Anesthesia Physical Anesthesia Plan  ASA: 3  Anesthesia Plan: General   Post-op Pain Management:    Induction: Intravenous  PONV Risk Score and Plan: 4 or greater and Midazolam, Dexamethasone and Ondansetron  Airway Management Planned: Oral ETT and Video Laryngoscope Planned  Additional Equipment:   Intra-op Plan:   Post-operative Plan: Extubation in OR  Informed Consent: I have reviewed the patients History and Physical, chart, labs and discussed the procedure including the risks, benefits and alternatives for the proposed anesthesia with the patient or authorized representative who has indicated his/her understanding and acceptance.     Dental advisory given  Plan Discussed with: CRNA  Anesthesia  Plan Comments:        Anesthesia Quick Evaluation

## 2021-10-29 ENCOUNTER — Ambulatory Visit (HOSPITAL_COMMUNITY)
Admission: RE | Admit: 2021-10-29 | Discharge: 2021-10-29 | Disposition: A | Discharge status: Home / Self Care | Payer: Medicare HMO | Attending: Otolaryngology | Admitting: Otolaryngology

## 2021-10-29 ENCOUNTER — Ambulatory Visit (HOSPITAL_COMMUNITY): Payer: Medicare HMO | Admitting: Anesthesiology

## 2021-10-29 ENCOUNTER — Other Ambulatory Visit: Payer: Self-pay

## 2021-10-29 ENCOUNTER — Encounter (HOSPITAL_COMMUNITY)
Admission: RE | Disposition: A | Discharge status: Home / Self Care | Payer: Self-pay | Source: Home / Self Care | Attending: Otolaryngology

## 2021-10-29 ENCOUNTER — Encounter (HOSPITAL_COMMUNITY): Payer: Self-pay | Admitting: Otolaryngology

## 2021-10-29 DIAGNOSIS — J392 Other diseases of pharynx: Secondary | ICD-10-CM | POA: Diagnosis not present

## 2021-10-29 DIAGNOSIS — G4730 Sleep apnea, unspecified: Secondary | ICD-10-CM | POA: Insufficient documentation

## 2021-10-29 DIAGNOSIS — G4733 Obstructive sleep apnea (adult) (pediatric): Secondary | ICD-10-CM | POA: Diagnosis not present

## 2021-10-29 DIAGNOSIS — Z87891 Personal history of nicotine dependence: Secondary | ICD-10-CM | POA: Insufficient documentation

## 2021-10-29 DIAGNOSIS — J329 Chronic sinusitis, unspecified: Secondary | ICD-10-CM | POA: Diagnosis not present

## 2021-10-29 DIAGNOSIS — Z6841 Body Mass Index (BMI) 40.0 and over, adult: Secondary | ICD-10-CM | POA: Insufficient documentation

## 2021-10-29 DIAGNOSIS — Z20822 Contact with and (suspected) exposure to covid-19: Secondary | ICD-10-CM | POA: Diagnosis not present

## 2021-10-29 DIAGNOSIS — R1313 Dysphagia, pharyngeal phase: Secondary | ICD-10-CM | POA: Diagnosis not present

## 2021-10-29 DIAGNOSIS — J019 Acute sinusitis, unspecified: Secondary | ICD-10-CM | POA: Diagnosis not present

## 2021-10-29 DIAGNOSIS — I10 Essential (primary) hypertension: Secondary | ICD-10-CM | POA: Diagnosis not present

## 2021-10-29 DIAGNOSIS — Z9989 Dependence on other enabling machines and devices: Secondary | ICD-10-CM | POA: Diagnosis not present

## 2021-10-29 HISTORY — PX: DIRECT LARYNGOSCOPY: SHX5326

## 2021-10-29 LAB — CBC
HCT: 39.4 % (ref 36.0–46.0)
Hemoglobin: 12.7 g/dL (ref 12.0–15.0)
MCH: 29.1 pg (ref 26.0–34.0)
MCHC: 32.2 g/dL (ref 30.0–36.0)
MCV: 90.4 fL (ref 80.0–100.0)
Platelets: 331 10*3/uL (ref 150–400)
RBC: 4.36 MIL/uL (ref 3.87–5.11)
RDW: 11.9 % (ref 11.5–15.5)
WBC: 9 10*3/uL (ref 4.0–10.5)
nRBC: 0 % (ref 0.0–0.2)

## 2021-10-29 LAB — BASIC METABOLIC PANEL
Anion gap: 10 (ref 5–15)
BUN: 11 mg/dL (ref 8–23)
CO2: 23 mmol/L (ref 22–32)
Calcium: 9.6 mg/dL (ref 8.9–10.3)
Chloride: 106 mmol/L (ref 98–111)
Creatinine, Ser: 0.81 mg/dL (ref 0.44–1.00)
GFR, Estimated: >60 mL/min (ref >60)
Glucose, Bld: 113 mg/dL — ABNORMAL HIGH (ref 70–99)
Potassium: 4.3 mmol/L (ref 3.5–5.1)
Sodium: 139 mmol/L (ref 135–145)

## 2021-10-29 LAB — SARS CORONAVIRUS 2 BY RT PCR (HOSPITAL ORDER, PERFORMED IN ~~LOC~~ HOSPITAL LAB): SARS Coronavirus 2: NEGATIVE

## 2021-10-29 LAB — NO BLOOD PRODUCTS

## 2021-10-29 SURGERY — LARYNGOSCOPY, DIRECT
Anesthesia: General | Site: Mouth

## 2021-10-29 MED ORDER — MIDAZOLAM HCL 5 MG/5ML IJ SOLN
INTRAMUSCULAR | Status: DC | PRN
  Administered 2021-10-29: 2 mg via INTRAVENOUS

## 2021-10-29 MED ORDER — OXYMETAZOLINE HCL 0.05 % NA SOLN
NASAL | Status: AC
  Filled 2021-10-29: qty 30

## 2021-10-29 MED ORDER — OXYMETAZOLINE HCL 0.05 % NA SOLN
NASAL | Status: DC | PRN
  Administered 2021-10-29: 1

## 2021-10-29 MED ORDER — FENTANYL CITRATE (PF) 100 MCG/2ML IJ SOLN: 25.0000 ug | INTRAMUSCULAR | Status: DC | PRN

## 2021-10-29 MED ORDER — MIDAZOLAM HCL 2 MG/2ML IJ SOLN
INTRAMUSCULAR | Status: AC
  Filled 2021-10-29: qty 2

## 2021-10-29 MED ORDER — SUGAMMADEX SODIUM 500 MG/5ML IV SOLN
INTRAVENOUS | Status: DC | PRN
  Administered 2021-10-29: 469.2 mg via INTRAVENOUS

## 2021-10-29 MED ORDER — GLYCOPYRROLATE PF 0.2 MG/ML IJ SOSY
PREFILLED_SYRINGE | INTRAMUSCULAR | Status: DC | PRN
  Administered 2021-10-29: .2 mg via INTRAVENOUS

## 2021-10-29 MED ORDER — FENTANYL CITRATE (PF) 250 MCG/5ML IJ SOLN
INTRAMUSCULAR | Status: AC
  Filled 2021-10-29: qty 5

## 2021-10-29 MED ORDER — CHLORHEXIDINE GLUCONATE 0.12 % MT SOLN
15.0000 mL | Freq: Once | OROMUCOSAL | Status: AC
  Administered 2021-10-29: 07:00:00 15 mL via OROMUCOSAL
  Filled 2021-10-29: qty 15

## 2021-10-29 MED ORDER — ACETAMINOPHEN 500 MG PO TABS
1000.0000 mg | ORAL_TABLET | Freq: Once | ORAL | Status: DC
  Filled 2021-10-29: qty 2

## 2021-10-29 MED ORDER — LACTATED RINGERS IV SOLN: INTRAVENOUS | Status: DC

## 2021-10-29 MED ORDER — DEXAMETHASONE SODIUM PHOSPHATE 4 MG/ML IJ SOLN
INTRAMUSCULAR | Status: DC | PRN
  Administered 2021-10-29: 8 mg via INTRAVENOUS

## 2021-10-29 MED ORDER — ONDANSETRON HCL 4 MG/2ML IJ SOLN: 4.0000 mg | Freq: Once | INTRAMUSCULAR | Status: DC | PRN

## 2021-10-29 MED ORDER — EPINEPHRINE HCL (NASAL) 0.1 % NA SOLN
NASAL | Status: AC
  Filled 2021-10-29: qty 30

## 2021-10-29 MED ORDER — FENTANYL CITRATE (PF) 100 MCG/2ML IJ SOLN
INTRAMUSCULAR | Status: DC | PRN
  Administered 2021-10-29: 100 ug via INTRAVENOUS
  Administered 2021-10-29: 50 ug via INTRAVENOUS

## 2021-10-29 MED ORDER — EPINEPHRINE PF 1 MG/ML IJ SOLN
INTRAMUSCULAR | Status: DC | PRN
  Administered 2021-10-29: 1 mg

## 2021-10-29 MED ORDER — 0.9 % SODIUM CHLORIDE (POUR BTL) OPTIME
TOPICAL | Status: DC | PRN
  Administered 2021-10-29: 08:00:00 1000 mL

## 2021-10-29 MED ORDER — ONDANSETRON HCL 4 MG/2ML IJ SOLN
INTRAMUSCULAR | Status: DC | PRN
  Administered 2021-10-29: 4 mg via INTRAVENOUS

## 2021-10-29 MED ORDER — ORAL CARE MOUTH RINSE: 15.0000 mL | Freq: Once | OROMUCOSAL | Status: AC

## 2021-10-29 MED ORDER — PROPOFOL 10 MG/ML IV BOLUS
INTRAVENOUS | Status: DC | PRN
  Administered 2021-10-29: 150 mg via INTRAVENOUS

## 2021-10-29 MED ORDER — LIDOCAINE 2% (20 MG/ML) 5 ML SYRINGE
INTRAMUSCULAR | Status: DC | PRN
  Administered 2021-10-29: 100 mg via INTRAVENOUS

## 2021-10-29 MED ORDER — ROCURONIUM BROMIDE 10 MG/ML (PF) SYRINGE
PREFILLED_SYRINGE | INTRAVENOUS | Status: DC | PRN
  Administered 2021-10-29: 60 mg via INTRAVENOUS

## 2021-10-29 MED ORDER — LACTATED RINGERS IV SOLN: INTRAVENOUS | Status: DC | PRN

## 2021-10-29 SURGICAL SUPPLY — 44 items
BAG COUNTER SPONGE SURGICOUNT (BAG) ×3 IMPLANT
BAG SPNG CNTER NS LX DISP (BAG) ×2
BAG SURGICOUNT SPONGE COUNTING (BAG) ×1
BALLN PULM 12 13.5 15X75 (BALLOONS)
BALLN PULM 12 13.5 15X75CM (BALLOONS)
BALLN PULM 15 16.5 18 X 75CM (BALLOONS)
BALLN PULM 15 16.5 18X75 (BALLOONS)
BALLN PULMONARY 10 12MM (MISCELLANEOUS)
BALLN PULMONARY 10-12 (MISCELLANEOUS) IMPLANT
BALLN PULMONARY 8 10 OD75CM (MISCELLANEOUS)
BALLN PULMONARY 8-10 OD75 (MISCELLANEOUS) IMPLANT
BALLOON PULM 12 13.5 15X75 (BALLOONS) IMPLANT
BALLOON PULM 15 16.5 18X75 (BALLOONS) IMPLANT
BLADE SURG 15 STRL LF DISP TIS (BLADE) IMPLANT
BLADE SURG 15 STRL SS (BLADE)
BNDG EYE OVAL (GAUZE/BANDAGES/DRESSINGS) IMPLANT
CANISTER SUCT 3000ML PPV (MISCELLANEOUS) ×4 IMPLANT
CNTNR URN SCR LID CUP LEK RST (MISCELLANEOUS) IMPLANT
CONT SPEC 4OZ STRL OR WHT (MISCELLANEOUS)
COVER BACK TABLE 60X90IN (DRAPES) ×4 IMPLANT
DRAPE HALF SHEET 40X57 (DRAPES) ×4 IMPLANT
GAUZE SPONGE 4X4 12PLY STRL (GAUZE/BANDAGES/DRESSINGS) ×4 IMPLANT
GLOVE SURG ENC MOIS LTX SZ7.5 (GLOVE) ×4 IMPLANT
GOWN STRL REUS W/ TWL LRG LVL3 (GOWN DISPOSABLE) IMPLANT
GOWN STRL REUS W/TWL LRG LVL3 (GOWN DISPOSABLE)
KIT BASIN OR (CUSTOM PROCEDURE TRAY) ×4 IMPLANT
KIT TURNOVER KIT B (KITS) ×4 IMPLANT
MARKER SKIN DUAL TIP RULER LAB (MISCELLANEOUS) IMPLANT
NDL HYPO 25GX1X1/2 BEV (NEEDLE) IMPLANT
NEEDLE HYPO 25GX1X1/2 BEV (NEEDLE) IMPLANT
NS IRRIG 1000ML POUR BTL (IV SOLUTION) ×4 IMPLANT
PAD ARMBOARD 7.5X6 YLW CONV (MISCELLANEOUS) ×8 IMPLANT
PATTIES SURGICAL .5 X1 (DISPOSABLE) IMPLANT
PATTIES SURGICAL .5 X3 (DISPOSABLE) ×4 IMPLANT
POSITIONER HEAD DONUT 9IN (MISCELLANEOUS) IMPLANT
SOL ANTI FOG 6CC (MISCELLANEOUS) IMPLANT
SOLUTION ANTI FOG 6CC (MISCELLANEOUS)
SURGILUBE 2OZ TUBE FLIPTOP (MISCELLANEOUS) IMPLANT
SUT SILK 2 0 PERMA HAND 18 BK (SUTURE) IMPLANT
SYR GAUGE ALLIANCE SINGLE USE (MISCELLANEOUS) ×4 IMPLANT
TOWEL GREEN STERILE FF (TOWEL DISPOSABLE) ×8 IMPLANT
TUBE CONNECTING 12'X1/4 (SUCTIONS) ×1
TUBE CONNECTING 12X1/4 (SUCTIONS) ×3 IMPLANT
WATER STERILE IRR 1000ML POUR (IV SOLUTION) ×4 IMPLANT

## 2021-10-29 NOTE — H&P (Signed)
Paula [name] is an 60 y.o. female.   Chief Complaint: Hypopharyngeal lesion HPI: 60 year old female with right pyriform sinus lesion presents for biopsy.  Past Medical History:  Diagnosis Date   Anxiety    pt stated anxiety episodes resemble stroke symptoms   Chronic lower back pain    Conversion disorder    Depression    Diverticulitis    Expressive aphasia 11/15/2016   Fibromyalgia    GERD (gastroesophageal reflux disease)    Heart murmur    High cholesterol    History of gastric ulcer    History of hiatal hernia    History of kidney stones    History of thyroid nodule    Hyperlipidemia    Hypertension    IBS (irritable bowel syndrome)    Intermittent vertigo    Migraine    "a few times/year now; maybe" (11/15/2016)   Obesity    OSA on CPAP    wears CPAP nightly ;study in epic 02-01-2014 (11/15/2016)   Osteoarthritis    "knees, hands, back, hips" (11/15/2016)   Pneumonia X 1   hx of   Psychogenic tremor    intermittant head tremor-(11/15/2016)   Refusal of blood transfusions as patient is Jehovah's Witness    RLS (restless legs syndrome)    Sleep apnea    CPAP   Urinary incontinence    Vertigo     Past Surgical History:  Procedure Laterality Date   ABDOMINAL HYSTERECTOMY  1990   Partial, still has ovaries   ANTERIOR CERVICAL DECOMP/DISCECTOMY FUSION  02/10/2004   C4 -- C7   BACK SURGERY     CARDIOVASCULAR STRESS TEST  2017   Negative   CESAREAN SECTION  1977; 1982   COLONOSCOPY     CYSTOSCOPY W/ RETROGRADES Right 06/07/2015   Procedure: CYSTOSCOPY WITH RETROGRADE PYELOGRAM;  Surgeon: Ardis Hughs, MD;  Location[facility];  Service: Urology;  Laterality: Right;   CYSTOSCOPY WITH URETEROSCOPY AND STENT PLACEMENT Right 06/07/2015   Procedure: RIGHT URETEROSCOPY, LASER LITHOTRIPSY, STONE REMOVAL AND RIGHT URETERAL STENT PLACEMENT;  Surgeon: Ardis Hughs, MD;  Location: [facility];  Service: Urology;  Laterality:  Right;   HERNIA REPAIR     HOLMIUM LASER APPLICATION N/A 32/44/0102   Procedure: HOLMIUM LASER APPLICATION;  Surgeon: Ardis Hughs, MD;  Location: [facility];  Service: Urology;  Laterality: N/A;   LAPAROSCOPIC CHOLECYSTECTOMY  10/07/2000   LIPOMA EXCISION  01/2017   from neck   UMBILICAL HERNIA REPAIR  age 24   UPPER GI ENDOSCOPY      Family History  Problem Relation Age of Onset   Hyperlipidemia Mother    Hypertension Mother    Sleep apnea Mother    Osteoporosis Mother    Hypertension Father    Heart disease Father    Syncope episode Father    Colon cancer Father    Atrial fibrillation Father    Hypertension Sister    Sleep apnea Sister    Sleep apnea Brother    Hypertension Brother    Colon cancer Maternal Aunt    Lung cancer Maternal Aunt    Hypertension Maternal Grandmother    CVA Maternal Grandmother    Pancreatic cancer Maternal Grandfather    Colon cancer Maternal Grandfather    Hypertension Maternal Grandfather    Lung cancer Maternal Grandfather    Throat cancer Maternal Grandfather    Colon cancer Paternal Grandfather    Esophageal cancer Neg  Hx    Liver cancer Neg Hx    Stomach cancer Neg Hx    Social History:  reports that she quit smoking about 13 years ago. Her smoking use included cigarettes. She has a 12.50 pack-year smoking history. She has never used smokeless tobacco. She reports that she does not currently use alcohol. She reports that she does not use drugs.  Allergies:  Allergies  Allergen Reactions   Detrol [Tolterodine] Other (See Comments)    slurred speech, tremors, dizziness   Gabapentin Palpitations and Other (See Comments)    Wt gain, tremor   Aspirin Other (See Comments)    Avoids--- history of gastric ulcers    Contrast Media [Iodinated Diagnostic Agents] Hives, Itching and Rash    CT contrast-Needed to take benadryl    Hydrocodone-Acetaminophen Nausea And Vomiting   Imitrex [Sumatriptan] Other (See Comments)     Body hurts   Oxycodone Nausea And Vomiting   Vicodin Hp [Hydrocodone-Acetaminophen] Nausea And Vomiting   Avelox [Moxifloxacin] Itching    Facility-Administered Medications Prior to Admission  Medication Dose Route Frequency Provider Last Rate Last Admin   0.9 %  sodium chloride infusion  500 mL Intravenous Once Irene Shipper, MD       Medications Prior to Admission  Medication Sig Dispense Refill   acetaminophen (TYLENOL) 500 MG tablet Take 1,000 mg by mouth every 6 (six) hours as needed for headache (pain).     Cholecalciferol (VITAMIN D3 PO) Take 1 tablet by mouth at bedtime.     Cyanocobalamin (VITAMIN B 12 PO) Take 1 tablet by mouth daily.     dexlansoprazole (DEXILANT) 60 MG capsule Take 60 mg by mouth daily.     dicyclomine (BENTYL) 20 MG tablet Take 20 mg by mouth in the morning, at noon, in the evening, and at bedtime.     famotidine (PEPCID) 20 MG tablet Take 1 tablet (20 mg total) by mouth at bedtime. 30 tablet 3   Peppermint Oil (IBGARD) 90 MG CPCR Take 2 capsules by mouth 4 (four) times daily -  before meals and at bedtime.     albuterol (VENTOLIN HFA) 108 (90 Base) MCG/ACT inhaler 2 puffs every 6 (six) hours as needed for shortness of breath or wheezing.     cyclobenzaprine (FLEXERIL) 10 MG tablet Take 1 tablet by mouth at bedtime as needed for muscle spasms.     SYMBICORT 160-4.5 MCG/ACT inhaler Inhale 1 puff into the lungs daily as needed (asthma).     vitamin E 1000 UNIT capsule Take 1,000 Units by mouth at bedtime.      Results for orders placed or performed during the hospital encounter of 10/29/21 (from the past 48 hour(s))  CBC per protocol     Status: None   Collection Time: 10/29/21  5:45 AM  Result Value Ref Range   WBC 9.0 4.0 - 10.5 K/uL   RBC 4.36 3.87 - 5.11 MIL/uL   Hemoglobin 12.7 12.0 - 15.0 g/dL   HCT 39.4 36.0 - 46.0 %   MCV 90.4 80.0 - 100.0 fL   MCH 29.1 26.0 - 34.0 pg   MCHC 32.2 30.0 - 36.0 g/dL   RDW 11.9 11.5 - 15.5 %   Platelets 331 150  - 400 K/uL   nRBC 0.0 0.0 - 0.2 %    Comment: Performed at [facility], [place] [address], [place], [place] [address]  SARS Coronavirus 2 by RT PCR (hospital order, performed in [facility] hospital lab) Nasopharyngeal  Nasopharyngeal Swab     Status: None   Collection Time: 10/29/21  5:46 AM   Specimen: Nasopharyngeal Swab  Result Value Ref Range   SARS Coronavirus 2 NEGATIVE NEGATIVE    Comment: (NOTE) SARS-CoV-2 target nucleic acids are NOT DETECTED.  The SARS-CoV-2 RNA is generally detectable in upper and lower respiratory specimens during the acute phase of infection. The lowest concentration of SARS-CoV-2 viral copies this assay can detect is 250 copies / mL. A negative result does not preclude SARS-CoV-2 infection and should not be used as the sole basis for treatment or other patient management decisions.  A negative result may occur with improper specimen collection / handling, submission of specimen other than nasopharyngeal swab, presence of viral mutation(s) within the areas targeted by this assay, and inadequate number of viral copies (<250 copies / mL). A negative result must be combined with clinical observations, patient history, and epidemiological information.  Fact Sheet for Patients:   StrictlyIdeas.no  Fact Sheet for Healthcare Providers: BankingDealers.co.za  This test is not yet approved or  cleared by the [place] FDA and has been authorized for detection and/or diagnosis of SARS-CoV-2 by FDA under an Emergency Use Authorization (EUA).  This EUA will remain in effect (meaning this test can be used) for the duration of the COVID-19 declaration under Section 564(b)(1) of the Act, 21 U.S.C. section 360bbb-3(b)(1), unless the authorization is terminated or revoked sooner.  Performed at [facility], [place] [address], [place], [place] [address]    No results found.  Review of Systems  All other  systems reviewed and are negative.  Blood pressure (!) 181/81, pulse 62, temperature 98.3 F (36.8 C), temperature source Oral, resp. rate 18, height 5\' 4"  (1.626 m), weight 117.3 kg, SpO2 98 %. Physical Exam Constitutional:      Appearance: Normal appearance.  HENT:     Head: Normocephalic and atraumatic.     Right Ear: External ear normal.     Left Ear: External ear normal.     Nose: Nose normal.     Mouth/Throat:     Mouth: Mucous membranes are moist.     Pharynx: Oropharynx is clear.  Eyes:     Extraocular Movements: Extraocular movements intact.     Conjunctiva/sclera: Conjunctivae normal.     Pupils: Pupils are equal, round, and reactive to light.  Cardiovascular:     Rate and Rhythm: Normal rate.  Pulmonary:     Effort: Pulmonary effort is normal.  Musculoskeletal:        General: Normal range of motion.     Cervical back: Normal range of motion.  Skin:    General: Skin is warm and dry.  Neurological:     General: No focal deficit present.     Mental Status: She is alert and oriented to person, place, and time.  Psychiatric:        Mood and Affect: Mood normal.        Behavior: Behavior normal.        Thought Content: Thought content normal.        Judgment: Judgment normal.     Assessment/Plan Right hypopharyngeal lesion  To OR for microlaryngoscopy with biopsy.  Melida Quitter, MD 10/29/2021, 7:25 AM

## 2021-10-29 NOTE — Progress Notes (Signed)
Pt refusing blood products. Attempted to fax signed blood refusal to blood bank, fax did not go through. Called blood bank to verify that form was not received and made them aware that pt is not to receive blood products. Called OR Room 8 to have form faxed to blood bank and this RN was advised that anesthesia team would be made aware.

## 2021-10-29 NOTE — Brief Op Note (Signed)
10/29/2021  8:15 AM  PATIENT:  Paula [name]  60 y.o. female  PRE-OPERATIVE DIAGNOSIS:  Hypopharyngeal lesion  POST-OPERATIVE DIAGNOSIS:  Hypopharyngeal lesion  PROCEDURE:  Procedure(s): DIRECT LARYNGOSCOPY WITH BIOPSY (N/A)  SURGEON:  Surgeon(s) and Role:    Melida Quitter, MD - Primary  PHYSICIAN ASSISTANT:   ASSISTANTS: none   ANESTHESIA:   general  EBL:  Minimal   BLOOD ADMINISTERED:none  DRAINS: none   LOCAL MEDICATIONS USED:  NONE  SPECIMEN:  Source of Specimen:  Right pyriform sinus  DISPOSITION OF SPECIMEN:  PATHOLOGY  COUNTS:  YES  TOURNIQUET:  * No tourniquets in log *  DICTATION: .Note written in EPIC  PLAN OF CARE: Discharge to home after PACU  PATIENT DISPOSITION:  PACU - hemodynamically stable.   Delay start of Pharmacological VTE agent (>24hrs) due to surgical blood loss or risk of bleeding: no

## 2021-10-29 NOTE — Anesthesia Procedure Notes (Signed)
Procedure Name: Intubation Date/Time: 10/29/2021 7:45 AM Performed by: Minerva Ends, CRNA Pre-anesthesia Checklist: Patient identified, Emergency Drugs available, Suction available and Patient being monitored Patient Re-evaluated:Patient Re-evaluated prior to induction Oxygen Delivery Method: Circle system utilized Preoxygenation: Pre-oxygenation with 100% oxygen Induction Type: IV induction Ventilation: Two handed mask ventilation required Laryngoscope Size: Mac and 3 Grade View: Grade II Tube type: Oral Laser Tube: Laser Tube and Cuffed inflated with minimal occlusive pressure - saline Tube size: 6.0 mm Number of attempts: 1 Airway Equipment and Method: Stylet Placement Confirmation: ETT inserted through vocal cords under direct vision, positive ETCO2 and breath sounds checked- equal and bilateral Tube secured with: Tape Dental Injury: Teeth and Oropharynx as per pre-operative assessment

## 2021-10-29 NOTE — Anesthesia Postprocedure Evaluation (Signed)
Anesthesia Post Note  Patient: Paula [name]  Procedure(s) Performed: DIRECT LARYNGOSCOPY WITH BIOPSY (Mouth)     Patient location during evaluation: PACU Anesthesia Type: General Level of consciousness: awake and alert Pain management: pain level controlled Vital Signs Assessment: post-procedure vital signs reviewed and stable Respiratory status: spontaneous breathing, nonlabored ventilation, respiratory function stable and patient connected to nasal cannula oxygen Cardiovascular status: blood pressure returned to baseline and stable Postop Assessment: no apparent nausea or vomiting Anesthetic complications: no   No notable events documented.  Last Vitals:  Vitals:   10/29/21 0825 10/29/21 0850  BP:    Pulse: 80 78  Resp: (!) 22 (!) 26  Temp:  36.7 C  SpO2: 98% 92%    Last Pain:  Vitals:   10/29/21 0848  TempSrc:   PainSc: 3                  Catalina Gravel

## 2021-10-29 NOTE — Transfer of Care (Signed)
Immediate Anesthesia Transfer of Care Note  Patient: ARICKA GOLDBERGER  Procedure(s) Performed: DIRECT LARYNGOSCOPY WITH BIOPSY (Mouth)  Patient Location: PACU  Anesthesia Type:General  Level of Consciousness: awake, alert , oriented and patient cooperative  Airway & Oxygen Therapy: Patient connected to nasal cannula oxygen  Post-op Assessment: Report given to RN and Post -op Vital signs reviewed and stable  Post vital signs: Reviewed and stable  Last Vitals:  Vitals Value Taken Time  BP 176/96 10/29/21 0817  Temp    Pulse 82 10/29/21 0818  Resp 18 10/29/21 0818  SpO2 84 % 10/29/21 0818  Vitals shown include unvalidated device data.  Last Pain:  Vitals:   10/29/21 0640  TempSrc:   PainSc: 0-No pain      Patients Stated Pain Goal: 3 (72/82/06 0156)  Complications: No notable events documented.

## 2021-10-29 NOTE — Op Note (Signed)
PREOPERATIVE DIAGNOSIS:  Right hypopharyngeal lesion   POSTOPERATIVE DIAGNOSIS:  Right hypopharyngeal lesion   PROCEDURE:  Direct laryngoscopy with biopsy   SURGEON:  Melida Quitter, MD   ANESTHESIA:  General endotracheal anesthesia   COMPLICATIONS:  None   INDICATIONS:  The patient is a 60 year old female with a history of right pyriform sinus lesion that has not improved with aggressive anti-reflux therapy.  She presents to the operating room for surgical management.   FINDINGS:  White speckling of right pyriform sinus   DESCRIPTION OF PROCEDURE:  The patient was identified in the holding room, informed consent having been obtained including discussion of risks, benefits and alternatives, the patient was brought to the operative suite and put the operative table in the supine position.  Anesthesia was induced and the patient was intubated by the Anesthesia team using a laser-safe tube without difficulty.  The eyes were taped closed and bed was turned 90 degrees from anesthesia.  The patient was given intravenous steroids during the case.  A tooth guard was placed over the upper teeth and the larynx and pharynx was examined using an anterior commissure laryngoscope.  Exposing the right pyriform sinus, two biopsies were taken using a cup forceps.  Specimen was passed to nursing for pathology.  An epinephrine-soaked pledget was held against the site for a few minutes.  The pledget was removed and the area again inspected.  The laryngoscope was then removed from the patient's mouth while suctioning the airway.  The tooth guard was removed and the patient was turned back to anesthesia for wakeup and taken to the recovery room in stable condition.

## 2021-10-30 ENCOUNTER — Encounter (HOSPITAL_COMMUNITY): Payer: Self-pay | Admitting: Otolaryngology

## 2021-11-02 DIAGNOSIS — U071 COVID-19: Secondary | ICD-10-CM | POA: Diagnosis not present

## 2021-11-06 ENCOUNTER — Other Ambulatory Visit: Payer: Self-pay | Admitting: Pulmonary Disease

## 2021-11-06 DIAGNOSIS — K219 Gastro-esophageal reflux disease without esophagitis: Secondary | ICD-10-CM

## 2021-11-09 LAB — SURGICAL PATHOLOGY

## 2021-11-20 ENCOUNTER — Ambulatory Visit
Admission: RE | Admit: 2021-11-20 | Discharge: 2021-11-20 | Disposition: A | Discharge status: Home / Self Care | Payer: Medicare HMO | Source: Ambulatory Visit | Attending: Family Medicine | Admitting: Family Medicine

## 2021-11-20 DIAGNOSIS — Z1231 Encounter for screening mammogram for malignant neoplasm of breast: Secondary | ICD-10-CM | POA: Diagnosis not present

## 2021-11-23 DIAGNOSIS — Z4689 Encounter for fitting and adjustment of other specified devices: Secondary | ICD-10-CM | POA: Diagnosis not present

## 2021-11-28 DIAGNOSIS — M1712 Unilateral primary osteoarthritis, left knee: Secondary | ICD-10-CM | POA: Diagnosis not present

## 2021-11-29 DIAGNOSIS — E782 Mixed hyperlipidemia: Secondary | ICD-10-CM | POA: Diagnosis not present

## 2021-11-29 DIAGNOSIS — R7303 Prediabetes: Secondary | ICD-10-CM | POA: Diagnosis not present

## 2021-12-07 ENCOUNTER — Ambulatory Visit (INDEPENDENT_AMBULATORY_CARE_PROVIDER_SITE_OTHER): Payer: Medicare HMO | Admitting: Cardiology

## 2021-12-07 ENCOUNTER — Encounter: Payer: Self-pay | Admitting: Cardiology

## 2021-12-07 ENCOUNTER — Other Ambulatory Visit: Payer: Self-pay

## 2021-12-07 VITALS — BP 152/96 | HR 64 | Ht 64.5 in | Wt 257.2 lb

## 2021-12-07 DIAGNOSIS — R072 Precordial pain: Secondary | ICD-10-CM

## 2021-12-07 DIAGNOSIS — R002 Palpitations: Secondary | ICD-10-CM | POA: Diagnosis not present

## 2021-12-07 DIAGNOSIS — E669 Obesity, unspecified: Secondary | ICD-10-CM | POA: Diagnosis not present

## 2021-12-07 DIAGNOSIS — R079 Chest pain, unspecified: Secondary | ICD-10-CM | POA: Diagnosis not present

## 2021-12-07 DIAGNOSIS — E785 Hyperlipidemia, unspecified: Secondary | ICD-10-CM | POA: Diagnosis not present

## 2021-12-07 DIAGNOSIS — R7303 Prediabetes: Secondary | ICD-10-CM

## 2021-12-07 DIAGNOSIS — Z01818 Encounter for other preprocedural examination: Secondary | ICD-10-CM

## 2021-12-07 MED ORDER — METOPROLOL TARTRATE 100 MG PO TABS: ORAL_TABLET | ORAL | 0 refills | Status: DC

## 2021-12-07 MED ORDER — DILTIAZEM HCL ER COATED BEADS 120 MG PO CP24: 120.0000 mg | ORAL_CAPSULE | Freq: Every day | ORAL | 3 refills | Status: DC

## 2021-12-07 MED ORDER — PREDNISONE 50 MG PO TABS: ORAL_TABLET | ORAL | 0 refills | Status: DC

## 2021-12-07 NOTE — Patient Instructions (Addendum)
Medication Instructions:  START Cardizem ER 120 mg daily HOLD Cardizem the morning of Coronary CTA TAKE Metoprolol Tartrate (Lopressor) 100 mg the morning of Cornary CTA TAKE Prednisone 50 mg 1 tablet 13 hours prior to test. Take 1 tablet 7 hours prior to test. Take 1 tablet 1 hour prior to test. TAKE Benadryl 50 mg 1 hour prior to the test   *If you need a refill on your cardiac medications before your next appointment, please call your pharmacy*  Lab Work: Your physician recommends that you return for lab work:  BMET [place]  If you have labs (blood work) drawn today and your tests are completely normal, you will receive your results only by: [place] (if you have MyChart) OR A paper copy in the mail If you have any lab test that is abnormal or we need to change your treatment, we will call you to review the results.  Testing/Procedures: Your physician has requested that you have cardiac CT. Cardiac computed tomography (CT) is a painless test that uses an x-ray machine to take clear, detailed pictures of your heart. For further information please visit [email or web address]. Please follow instruction sheet as given.   Follow-Up: At [facility], you and your health needs are our priority.  As part of our continuing mission to provide you with exceptional heart care, we have created designated Provider Care Teams.  These Care Teams include your primary Cardiologist (physician) and Advanced Practice Providers (APPs -  Physician Assistants and Nurse Practitioners) who all work together to provide you with the care you need, when you need it.  Your next appointment:   6 month(s)  The format for your next appointment:   In Person  Provider:   Berniece Salines, DO    Other Instructions You have been referred to [facility]    Low-Sodium Eating Plan Sodium, which is an element that makes up salt, helps you maintain a healthy balance of fluids in your body.  Too much sodium can increase your blood pressure and cause fluid and waste to be held in your body. Your health care provider or dietitian may recommend following this plan if you have high blood pressure (hypertension), kidney disease, liver disease, or heart failure. Eating less sodium can help lower your blood pressure, reduce swelling, and protect your heart, liver, and kidneys. What are tips for following this plan? Reading food labels The Nutrition Facts label lists the amount of sodium in one serving of the food. If you eat more than one serving, you must multiply the listed amount of sodium by the number of servings. Choose foods with less than 140 mg of sodium per serving. Avoid foods with 300 mg of sodium or more per serving. Shopping  Look for lower-sodium products, often labeled as "low-sodium" or "no salt added." Always check the sodium content, even if foods are labeled as "unsalted" or "no salt added." Buy fresh foods. Avoid canned foods and pre-made or frozen meals. Avoid canned, cured, or processed meats. Buy breads that have less than 80 mg of sodium per slice. Cooking  Eat more home-cooked food and less restaurant, buffet, and fast food. Avoid adding salt when cooking. Use salt-free seasonings or herbs instead of table salt or sea salt. Check with your health care provider or pharmacist before using salt substitutes. Cook with plant-based oils, such as canola, sunflower, or olive oil. Meal planning When eating at a restaurant, ask that your food be prepared with less salt or no salt,  if possible. Avoid dishes labeled as brined, pickled, cured, smoked, or made with soy sauce, miso, or teriyaki sauce. Avoid foods that contain MSG (monosodium glutamate). MSG is sometimes added to [place] food, bouillon, and some canned foods. Make meals that can be grilled, baked, poached, roasted, or steamed. These are generally made with less sodium. General information Most people on this  plan should limit their sodium intake to 1,500-2,000 mg (milligrams) of sodium each day. What foods should I eat? Fruits Fresh, frozen, or canned fruit. Fruit juice. Vegetables Fresh or frozen vegetables. "No salt added" canned vegetables. "No salt added" tomato sauce and paste. Low-sodium or reduced-sodium tomato and vegetable juice. Grains Low-sodium cereals, including oats, puffed wheat and rice, and shredded wheat. Low-sodium crackers. Unsalted rice. Unsalted pasta. Low-sodium bread. Whole-grain breads and whole-grain pasta. Meats and other proteins Fresh or frozen (no salt added) meat, poultry, seafood, and fish. Low-sodium canned tuna and salmon. Unsalted nuts. Dried peas, beans, and lentils without added salt. Unsalted canned beans. Eggs. Unsalted nut butters. Dairy Milk. Soy milk. Cheese that is naturally low in sodium, such as ricotta cheese, fresh mozzarella, or Swiss cheese. Low-sodium or reduced-sodium cheese. Cream cheese. Yogurt. Seasonings and condiments Fresh and dried herbs and spices. Salt-free seasonings. Low-sodium mustard and ketchup. Sodium-free salad dressing. Sodium-free light mayonnaise. Fresh or refrigerated horseradish. Lemon juice. Vinegar. Other foods Homemade, reduced-sodium, or low-sodium soups. Unsalted popcorn and pretzels. Low-salt or salt-free chips. The items listed above may not be a complete list of foods and beverages you can eat. Contact a dietitian for more information. What foods should I avoid? Vegetables Sauerkraut, pickled vegetables, and relishes. Olives. [place] fries. Onion rings. Regular canned vegetables (not low-sodium or reduced-sodium). Regular canned tomato sauce and paste (not low-sodium or reduced-sodium). Regular tomato and vegetable juice (not low-sodium or reduced-sodium). Frozen vegetables in sauces. Grains Instant hot cereals. Bread stuffing, pancake, and biscuit mixes. Croutons. Seasoned rice or pasta mixes. Noodle soup cups. Boxed or  frozen macaroni and cheese. Regular salted crackers. Self-rising flour. Meats and other proteins Meat or fish that is salted, canned, smoked, spiced, or pickled. Precooked or cured meat, such as sausages or meat loaves. Berniece Salines. Ham. Pepperoni. Hot dogs. Corned beef. Chipped beef. Salt pork. Jerky. Pickled herring. Anchovies and sardines. Regular canned tuna. Salted nuts. Dairy Processed cheese and cheese spreads. Hard cheeses. Cheese curds. Blue cheese. Feta cheese. String cheese. Regular cottage cheese. Buttermilk. Canned milk. Fats and oils Salted butter. Regular margarine. Ghee. Bacon fat. Seasonings and condiments Onion salt, garlic salt, seasoned salt, table salt, and sea salt. Canned and packaged gravies. Worcestershire sauce. Tartar sauce. Barbecue sauce. Teriyaki sauce. Soy sauce, including reduced-sodium. Steak sauce. Fish sauce. Oyster sauce. Cocktail sauce. Horseradish that you find on the shelf. Regular ketchup and mustard. Meat flavorings and tenderizers. Bouillon cubes. Hot sauce. Pre-made or packaged marinades. Pre-made or packaged taco seasonings. Relishes. Regular salad dressings. Salsa. Other foods Salted popcorn and pretzels. Corn chips and puffs. Potato and tortilla chips. Canned or dried soups. Pizza. Frozen entrees and pot pies. The items listed above may not be a complete list of foods and beverages you should avoid. Contact a dietitian for more information. Summary Eating less sodium can help lower your blood pressure, reduce swelling, and protect your heart, liver, and kidneys. Most people on this plan should limit their sodium intake to 1,500-2,000 mg (milligrams) of sodium each day. Canned, boxed, and frozen foods are high in sodium. Restaurant foods, fast foods, and pizza are also very high in  sodium. You also get sodium by adding salt to food. Try to cook at home, eat more fresh fruits and vegetables, and eat less fast food and canned, processed, or prepared foods. This  information is not intended to replace advice given to you by your health care provider. Make sure you discuss any questions you have with your health care provider. Document Revised: 12/24/2019 Document Reviewed: 10/20/2019 Elsevier Patient Education  2022 [place] cardiac [facility] will be scheduled at one of the below locations:   [facility] [address] [place], [place] [address] [phone]  [place] [address] [place], [place] [address] [phone]  If scheduled at [facility], please arrive at the [facility] main entrance (entrance A) of [facility] 30 minutes prior to test start time. You can use the FREE valet parking offered at the main entrance (encouraged to control the heart rate for the test) Proceed to the [facility] Radiology Department (first floor) to check-in and test prep.  If scheduled at [facility], please arrive 15 mins early for check-in and test prep.  Please follow these instructions carefully (unless otherwise directed):  On the Night Before the Test: Be sure to Drink plenty of water. Do not consume any caffeinated/decaffeinated beverages or chocolate 12 hours prior to your test. Do not take any antihistamines 12 hours prior to your test. If the patient has contrast allergy: Patient will need a prescription for Prednisone and very clear instructions (as follows): Prednisone 50 mg - take 13 hours prior to test Take another Prednisone 50 mg 7 hours prior to test Take another Prednisone 50 mg 1 hour prior to test Take Benadryl 50 mg 1 hour prior to test Patient must complete all four doses of above prophylactic medications. Patient will need a ride after test due to Benadryl.  On the Day of the Test: Drink plenty of water until 1 hour prior to the test. Do not eat any food 4 hours prior to the test. You may take your  regular medications prior to the test.  Take metoprolol (Lopressor) two hours prior to test. HOLD Furosemide/Hydrochlorothiazide morning of the test. FEMALES- please wear underwire-free bra if available, avoid dresses & tight clothing  After the Test: Drink plenty of water. After receiving IV contrast, you may experience a mild flushed feeling. This is normal. On occasion, you may experience a mild rash up to 24 hours after the test. This is not dangerous. If this occurs, you can take Benadryl 25 mg and increase your fluid intake. If you experience trouble breathing, this can be serious. If it is severe call 911 IMMEDIATELY. If it is mild, please call our office. If you take any of these medications: Glipizide/Metformin, Avandament, Glucavance, please do not take 48 hours after completing test unless otherwise instructed.  Please allow 2-4 weeks for scheduling of routine cardiac CTs. Some insurance companies require a pre-authorization which may delay scheduling of this test.   For non-scheduling related questions, please contact the cardiac imaging nurse navigator should you have any questions/concerns: Marchia Bond, Cardiac Imaging Nurse Navigator Gordy Clement, Cardiac Imaging Nurse Navigator [facility] Direct Office Dial: [phone]   For scheduling needs, including cancellations and rescheduling, please call [place], [phone].

## 2021-12-07 NOTE — Progress Notes (Signed)
Cardiology Office Note:    Date:  12/07/2021   ID:  Paula [name], DOB 1961/05/24, MRN [id number]  PCP:  Antony Contras, MD  Cardiologist:  Berniece Salines, DO  Electrophysiologist:  None   Referring MD: Antony Contras, MD   " I am having some chest discomfort  History of Present Illness:    Paula [name] is a 61 y.o. female with a hx of sleep apnea on CPAP, vitamin D deficiency, obesity, hypertension, prediabetes, hyperlipidemia intolerance to statins is here today at the request of her primary care doctor to be evaluated for hyperlipidemia.  In addition the patient tells me that she also has been experiencing intermittent chest pain.  She tells me that this is intermittent.  It comes off and on.  At first it started when she would eat and she thought it was GERD but now this is sporadic and occur anytime.  Sometimes occurs when she is lying down.  In addition she tells me that she has had some shortness of breath and fatigue.    Past Medical History:  Diagnosis Date   Anxiety    pt stated anxiety episodes resemble stroke symptoms   Chronic lower back pain    Conversion disorder    Depression    Diverticulitis    Expressive aphasia 11/15/2016   Fibromyalgia    GERD (gastroesophageal reflux disease)    Heart murmur    High cholesterol    History of gastric ulcer    History of hiatal hernia    History of kidney stones    History of thyroid nodule    Hyperlipidemia    Hypertension    IBS (irritable bowel syndrome)    Intermittent vertigo    Migraine    "a few times/year now; maybe" (11/15/2016)   Obesity    OSA on CPAP    wears CPAP nightly ;study in epic 02-01-2014 (11/15/2016)   Osteoarthritis    "knees, hands, back, hips" (11/15/2016)   Pneumonia X 1   hx of   Psychogenic tremor    intermittant head tremor-(11/15/2016)   Refusal of blood transfusions as patient is Jehovah's Witness    RLS (restless legs syndrome)    Sleep apnea    CPAP   Urinary incontinence     Vertigo     Past Surgical History:  Procedure Laterality Date   ABDOMINAL HYSTERECTOMY  1990   Partial, still has ovaries   ANTERIOR CERVICAL DECOMP/DISCECTOMY FUSION  02/10/2004   C4 -- C7   BACK SURGERY     CARDIOVASCULAR STRESS TEST  2017   Negative   CESAREAN SECTION  1977; 1982   COLONOSCOPY     CYSTOSCOPY W/ RETROGRADES Right 06/07/2015   Procedure: CYSTOSCOPY WITH RETROGRADE PYELOGRAM;  Surgeon: Ardis Hughs, MD;  Location[facility];  Service: Urology;  Laterality: Right;   CYSTOSCOPY WITH URETEROSCOPY AND STENT PLACEMENT Right 06/07/2015   Procedure: RIGHT URETEROSCOPY, LASER LITHOTRIPSY, STONE REMOVAL AND RIGHT URETERAL STENT PLACEMENT;  Surgeon: Ardis Hughs, MD;  Location: [facility];  Service: Urology;  Laterality: Right;   DIRECT LARYNGOSCOPY N/A 10/29/2021   Procedure: DIRECT LARYNGOSCOPY WITH BIOPSY;  Surgeon: Melida Quitter, MD;  Location: [facility];  Service: ENT;  Laterality: N/A;   HERNIA REPAIR     HOLMIUM LASER APPLICATION N/A 22/29/7989   Procedure: HOLMIUM LASER APPLICATION;  Surgeon: Ardis Hughs, MD;  Location: [facility];  Service: Urology;  Laterality: N/A;   LAPAROSCOPIC CHOLECYSTECTOMY  10/07/2000   LIPOMA EXCISION  01/2017   from neck   UMBILICAL HERNIA REPAIR  age 65   UPPER GI ENDOSCOPY      Current Medications: Current Meds  Medication Sig   acetaminophen (TYLENOL) 500 MG tablet Take 1,000 mg by mouth every 6 (six) hours as needed for headache (pain).   albuterol (VENTOLIN HFA) 108 (90 Base) MCG/ACT inhaler 2 puffs every 6 (six) hours as needed for shortness of breath or wheezing.   Cholecalciferol (VITAMIN D3 PO) Take 1 tablet by mouth at bedtime.   Cyanocobalamin (VITAMIN B 12 PO) Take 1 tablet by mouth daily.   cyclobenzaprine (FLEXERIL) 10 MG tablet Take 1 tablet by mouth at bedtime as needed for muscle spasms.   dexlansoprazole (DEXILANT) 60 MG capsule Take 60 mg by mouth daily.    dicyclomine (BENTYL) 20 MG tablet Take 20 mg by mouth in the morning, at noon, in the evening, and at bedtime.   diltiazem (CARDIZEM CD) 120 MG 24 hr capsule Take 1 capsule (120 mg total) by mouth daily.   ELDERBERRY PO Take by mouth.   famotidine (PEPCID) 20 MG tablet TAKE 1 TABLET AT BEDTIME   metoprolol tartrate (LOPRESSOR) 100 MG tablet Take 2 hours prior to CT   predniSONE (DELTASONE) 50 MG tablet Take 1 tablet 13 hours prior to test. Take 1 tablet 7 hours prior to test. Take 1 tablet 1 hour prior to test.   SYMBICORT 160-4.5 MCG/ACT inhaler Inhale 1 puff into the lungs daily as needed (asthma).   Current Facility-Administered Medications for the 12/07/21 encounter (Office Visit) with Berniece Salines, DO  Medication   0.9 %  sodium chloride infusion     Allergies:   Detrol [tolterodine], Gabapentin, Aspirin, Contrast media [iodinated contrast media], Hydrocodone-acetaminophen, Imitrex [sumatriptan], Oxycodone, Vicodin hp [hydrocodone-acetaminophen], and Avelox [moxifloxacin]   Social History   Socioeconomic History   Marital status: Married    Spouse name: Charles   Number of children: 2   Years of education: 11   Highest education level: Not on file  Occupational History   Occupation: disabled  Tobacco Use   Smoking status: Former    Packs/day: 0.50    Years: 25.00    Pack years: 12.50    Types: Cigarettes    Quit date: 07/24/2008    Years since quitting: 13.3   Smokeless tobacco: Never  Vaping Use   Vaping Use: Never used  Substance and Sexual Activity   Alcohol use: Not Currently    Comment: 5 drinks per year or less   Drug use: No   Sexual activity: Yes  Other Topics Concern   Not on file  Social History Narrative   Married, lives at home      Caffeine use - coffee daily (decaf), tea once a week      Jehovah's Witness - no blood products   Right handed   Social Determinants of Health   Financial Resource Strain: Not on file  Food Insecurity: Not on file   Transportation Needs: Not on file  Physical Activity: Not on file  Stress: Not on file  Social Connections: Not on file     Family History: The patient's family history includes Atrial fibrillation in her father; CVA in her maternal grandmother; Colon cancer in her father, maternal aunt, maternal grandfather, and paternal grandfather; Heart disease in her father; Hyperlipidemia in her mother; Hypertension in her brother, father, maternal grandfather, maternal grandmother, mother, and sister; Lung cancer in her maternal aunt and  maternal grandfather; Osteoporosis in her mother; Pancreatic cancer in her maternal grandfather; Sleep apnea in her brother, mother, and sister; Syncope episode in her father; Throat cancer in her maternal grandfather. There is no history of Esophageal cancer, Liver cancer, or Stomach cancer.  ROS:   Review of Systems  Constitution: Negative for decreased appetite, fever and weight gain.  HENT: Negative for congestion, ear discharge, hoarse voice and sore throat.   Eyes: Negative for discharge, redness, vision loss in right eye and visual halos.  Cardiovascular: Negative for chest pain, dyspnea on exertion, leg swelling, orthopnea and palpitations.  Respiratory: Negative for cough, hemoptysis, shortness of breath and snoring.   Endocrine: Negative for heat intolerance and polyphagia.  Hematologic/Lymphatic: Negative for bleeding problem. Does not bruise/bleed easily.  Skin: Negative for flushing, nail changes, rash and suspicious lesions.  Musculoskeletal: Negative for arthritis, joint pain, muscle cramps, myalgias, neck pain and stiffness.  Gastrointestinal: Negative for abdominal pain, bowel incontinence, diarrhea and excessive appetite.  Genitourinary: Negative for decreased libido, genital sores and incomplete emptying.  Neurological: Negative for brief paralysis, focal weakness, headaches and loss of balance.  Psychiatric/Behavioral: Negative for altered mental  status, depression and suicidal ideas.  Allergic/Immunologic: Negative for HIV exposure and persistent infections.    EKGs/Labs/Other Studies Reviewed:    The following studies were reviewed today:   EKG:  The ekg ordered today demonstrates   Transthoracic echocardiogram July 01, 2020 IMPRESSIONS    1. Left ventricular ejection fraction, by estimation, is 55 to 60%. The  left ventricle has normal function. The left ventricle has no regional  wall motion abnormalities. Left ventricular diastolic parameters are  consistent with Grade I diastolic  dysfunction (impaired relaxation).   2. Right ventricular systolic function is normal. The right ventricular  size is normal. There is normal pulmonary artery systolic pressure. The  estimated right ventricular systolic pressure is 40.8 mmHg.   3. The mitral valve is normal in structure. No evidence of mitral valve  regurgitation. No evidence of mitral stenosis.   4. The aortic valve is normal in structure. Aortic valve regurgitation is  not visualized. No aortic stenosis is present.   5. The inferior vena cava is normal in size with greater than 50%  respiratory variability, suggesting right atrial pressure of 3 mmHg.   Conclusion(s)/Recommendation(s): No intracardiac source of embolism  detected on this transthoracic study. A transesophageal echocardiogram is  recommended to exclude cardiac source of embolism if clinically indicated.   FINDINGS   Left Ventricle: Left ventricular ejection fraction, by estimation, is 55  to 60%. The left ventricle has normal function. The left ventricle has no  regional wall motion abnormalities. The left ventricular internal cavity  size was normal in size. There is   no left ventricular hypertrophy. Left ventricular diastolic parameters  are consistent with Grade I diastolic dysfunction (impaired relaxation).   Right Ventricle: The right ventricular size is normal. No increase in  right ventricular  wall thickness. Right ventricular systolic function is  normal. There is normal pulmonary artery systolic pressure. The tricuspid  regurgitant velocity is 2.19 m/s, and   with an assumed right atrial pressure of 3 mmHg, the estimated right  ventricular systolic pressure is 14.4 mmHg.   Left Atrium: Left atrial size was normal in size.   Right Atrium: Right atrial size was normal in size.   Pericardium: There is no evidence of pericardial effusion.   Mitral Valve: The mitral valve is normal in structure. Normal mobility of  the  mitral valve leaflets. No evidence of mitral valve regurgitation. No  evidence of mitral valve stenosis.   Tricuspid Valve: The tricuspid valve is normal in structure. Tricuspid  valve regurgitation is trivial. No evidence of tricuspid stenosis.   Aortic Valve: The aortic valve is normal in structure. Aortic valve  regurgitation is not visualized. No aortic stenosis is present.   Pulmonic Valve: The pulmonic valve was normal in structure. Pulmonic valve  regurgitation is not visualized. No evidence of pulmonic stenosis.   Aorta: The aortic root is normal in size and structure.   Venous: The inferior vena cava is normal in size with greater than 50%  respiratory variability, suggesting right atrial pressure of 3 mmHg.   IAS/Shunts: No atrial level shunt detected by color flow Doppler.       Recent Labs: 09/14/2021: ALT 40 10/29/2021: BUN 11; Creatinine, Ser 0.81; Hemoglobin 12.7; Platelets 331; Potassium 4.3; Sodium 139  Recent Lipid Panel    Component Value Date/Time   CHOL 281 (H) 07/01/2020 0206   TRIG 175 (H) 07/01/2020 0206   HDL 44 07/01/2020 0206   CHOLHDL 6.4 07/01/2020 0206   VLDL 35 07/01/2020 0206   LDLCALC 202 (H) 07/01/2020 0206   LDLDIRECT 170.1 01/08/2010 0943    Physical Exam:    VS:  BP (!) 152/96 (BP Location: Right Arm)    Pulse 64    Ht 5' 4.5" (1.638 m)    Wt 257 lb 3.2 oz (116.7 kg)    SpO2 96%    BMI 43.47 kg/m     Wt  Readings from Last 3 Encounters:  12/07/21 257 lb 3.2 oz (116.7 kg)  10/29/21 258 lb 8 oz (117.3 kg)  10/18/21 259 lb (117.5 kg)     GEN: Well nourished, well developed in no acute distress HEENT: Normal NECK: No JVD; No carotid bruits LYMPHATICS: No lymphadenopathy CARDIAC: S1S2 noted,RRR, no murmurs, rubs, gallops RESPIRATORY:  Clear to auscultation without rales, wheezing or rhonchi  ABDOMEN: Soft, non-tender, non-distended, +bowel sounds, no guarding. EXTREMITIES: No edema, No cyanosis, no clubbing MUSCULOSKELETAL:  No deformity  SKIN: Warm and dry NEUROLOGIC:  Alert and oriented x 3, non-focal PSYCHIATRIC:  Normal affect, good insight  ASSESSMENT:    1. Precordial pain   2. Chest pain, unspecified type   3. Prediabetes   4. Preprocedural examination   5. Hyperlipidemia, unspecified hyperlipidemia type   6. Palpitations   7. Obesity (BMI 30-39.9)    PLAN:    The symptoms chest pain is concerning, this patient does have intermediate risk for coronary artery disease and at this time I would like to pursue an ischemic evaluation in this patient.  Shared decision a coronary CTA at this time is appropriate.  I have discussed with the patient about the testing.  The patient has IV contrast allergy and is agreeable to proceed with this test therefore we will get the patient prepped for her sterile contrast.  We discussed the protocol with her.  She expresses understanding.   There is no need for any repeat echocardiogram I reviewed her July 2021 echocardiogram with her.  But I do suspect that there needs to be some adjustment of her IPAP pressure for her CPAP.  She tells me that she is using a number for 15 years ago and since that time she has gained more weight.  And she is experiencing the daytime somnolence again fatigue as well as she tells me that her husband tells her that she is snoring over  the CPAP.  The patient understands the need to lose weight with diet and exercise.  We have discussed specific strategies for this.  She does have hyperlipidemia however this is not well treated with lipid-lowering agents given she is experiencing significant reaction to the statins.  I like to refer her to our lipid clinic for consideration for PCSK9 inhibitors.  She is agreeable for this.  She has had significant palpitations she has had ambulatory monitor which does not show any evidence of arrhythmia.  I would like to try the patient with low-dose calcium channel blocker to see if this can help.  I am hoping this will also help with her blood pressure as she is hypertensive in the office today.   The patient is in agreement with the above plan. The patient left the office in stable condition.  The patient will follow up in 6 months or sooner if needed.   Medication Adjustments/Labs and Tests Ordered: Current medicines are reviewed at length with the patient today.  Concerns regarding medicines are outlined above.  Orders Placed This Encounter  Procedures   CT CORONARY MORPH W/CTA COR W/SCORE W/CA W/CM &/OR WO/CM   Magnesium   Basic metabolic panel   AMB Referral to [facility]   EKG 12-Lead   Meds ordered this encounter  Medications   predniSONE (DELTASONE) 50 MG tablet    Sig: Take 1 tablet 13 hours prior to test. Take 1 tablet 7 hours prior to test. Take 1 tablet 1 hour prior to test.    Dispense:  3 tablet    Refill:  0   metoprolol tartrate (LOPRESSOR) 100 MG tablet    Sig: Take 2 hours prior to CT    Dispense:  1 tablet    Refill:  0   diltiazem (CARDIZEM CD) 120 MG 24 hr capsule    Sig: Take 1 capsule (120 mg total) by mouth daily.    Dispense:  90 capsule    Refill:  3    Patient Instructions  Medication Instructions:  START Cardizem ER 120 mg daily HOLD Cardizem the morning of Coronary CTA TAKE Metoprolol Tartrate (Lopressor) 100 mg the morning of Cornary CTA TAKE Prednisone 50 mg 1 tablet 13 hours prior to test. Take 1 tablet  7 hours prior to test. Take 1 tablet 1 hour prior to test. TAKE Benadryl 50 mg 1 hour prior to the test   *If you need a refill on your cardiac medications before your next appointment, please call your pharmacy*  Lab Work: Your physician recommends that you return for lab work:  BMET [place]  If you have labs (blood work) drawn today and your tests are completely normal, you will receive your results only by: [place] (if you have MyChart) OR A paper copy in the mail If you have any lab test that is abnormal or we need to change your treatment, we will call you to review the results.  Testing/Procedures: Your physician has requested that you have cardiac CT. Cardiac computed tomography (CT) is a painless test that uses an x-ray machine to take clear, detailed pictures of your heart. For further information please visit [email or web address]. Please follow instruction sheet as given.   Follow-Up: At [facility], you and your health needs are our priority.  As part of our continuing mission to provide you with exceptional heart care, we have created designated Provider Care Teams.  These Care Teams include your primary Cardiologist (physician) and Advanced Practice Providers (  APPs -  Physician Assistants and Nurse Practitioners) who all work together to provide you with the care you need, when you need it.  Your next appointment:   6 month(s)  The format for your next appointment:   In Person  Provider:   Berniece Salines, DO    Other Instructions You have been referred to [facility]    Low-Sodium Eating Plan Sodium, which is an element that makes up salt, helps you maintain a healthy balance of fluids in your body. Too much sodium can increase your blood pressure and cause fluid and waste to be held in your body. Your health care provider or dietitian may recommend following this plan if you have high blood pressure (hypertension), kidney disease, liver  disease, or heart failure. Eating less sodium can help lower your blood pressure, reduce swelling, and protect your heart, liver, and kidneys. What are tips for following this plan? Reading food labels The Nutrition Facts label lists the amount of sodium in one serving of the food. If you eat more than one serving, you must multiply the listed amount of sodium by the number of servings. Choose foods with less than 140 mg of sodium per serving. Avoid foods with 300 mg of sodium or more per serving. Shopping  Look for lower-sodium products, often labeled as "low-sodium" or "no salt added." Always check the sodium content, even if foods are labeled as "unsalted" or "no salt added." Buy fresh foods. Avoid canned foods and pre-made or frozen meals. Avoid canned, cured, or processed meats. Buy breads that have less than 80 mg of sodium per slice. Cooking  Eat more home-cooked food and less restaurant, buffet, and fast food. Avoid adding salt when cooking. Use salt-free seasonings or herbs instead of table salt or sea salt. Check with your health care provider or pharmacist before using salt substitutes. Cook with plant-based oils, such as canola, sunflower, or olive oil. Meal planning When eating at a restaurant, ask that your food be prepared with less salt or no salt, if possible. Avoid dishes labeled as brined, pickled, cured, smoked, or made with soy sauce, miso, or teriyaki sauce. Avoid foods that contain MSG (monosodium glutamate). MSG is sometimes added to [place] food, bouillon, and some canned foods. Make meals that can be grilled, baked, poached, roasted, or steamed. These are generally made with less sodium. General information Most people on this plan should limit their sodium intake to 1,500-2,000 mg (milligrams) of sodium each day. What foods should I eat? Fruits Fresh, frozen, or canned fruit. Fruit juice. Vegetables Fresh or frozen vegetables. "No salt added" canned vegetables.  "No salt added" tomato sauce and paste. Low-sodium or reduced-sodium tomato and vegetable juice. Grains Low-sodium cereals, including oats, puffed wheat and rice, and shredded wheat. Low-sodium crackers. Unsalted rice. Unsalted pasta. Low-sodium bread. Whole-grain breads and whole-grain pasta. Meats and other proteins Fresh or frozen (no salt added) meat, poultry, seafood, and fish. Low-sodium canned tuna and salmon. Unsalted nuts. Dried peas, beans, and lentils without added salt. Unsalted canned beans. Eggs. Unsalted nut butters. Dairy Milk. Soy milk. Cheese that is naturally low in sodium, such as ricotta cheese, fresh mozzarella, or Swiss cheese. Low-sodium or reduced-sodium cheese. Cream cheese. Yogurt. Seasonings and condiments Fresh and dried herbs and spices. Salt-free seasonings. Low-sodium mustard and ketchup. Sodium-free salad dressing. Sodium-free light mayonnaise. Fresh or refrigerated horseradish. Lemon juice. Vinegar. Other foods Homemade, reduced-sodium, or low-sodium soups. Unsalted popcorn and pretzels. Low-salt or salt-free chips. The items listed  above may not be a complete list of foods and beverages you can eat. Contact a dietitian for more information. What foods should I avoid? Vegetables Sauerkraut, pickled vegetables, and relishes. Olives. [place] fries. Onion rings. Regular canned vegetables (not low-sodium or reduced-sodium). Regular canned tomato sauce and paste (not low-sodium or reduced-sodium). Regular tomato and vegetable juice (not low-sodium or reduced-sodium). Frozen vegetables in sauces. Grains Instant hot cereals. Bread stuffing, pancake, and biscuit mixes. Croutons. Seasoned rice or pasta mixes. Noodle soup cups. Boxed or frozen macaroni and cheese. Regular salted crackers. Self-rising flour. Meats and other proteins Meat or fish that is salted, canned, smoked, spiced, or pickled. Precooked or cured meat, such as sausages or meat loaves. Berniece Salines. Ham. Pepperoni.  Hot dogs. Corned beef. Chipped beef. Salt pork. Jerky. Pickled herring. Anchovies and sardines. Regular canned tuna. Salted nuts. Dairy Processed cheese and cheese spreads. Hard cheeses. Cheese curds. Blue cheese. Feta cheese. String cheese. Regular cottage cheese. Buttermilk. Canned milk. Fats and oils Salted butter. Regular margarine. Ghee. Bacon fat. Seasonings and condiments Onion salt, garlic salt, seasoned salt, table salt, and sea salt. Canned and packaged gravies. Worcestershire sauce. Tartar sauce. Barbecue sauce. Teriyaki sauce. Soy sauce, including reduced-sodium. Steak sauce. Fish sauce. Oyster sauce. Cocktail sauce. Horseradish that you find on the shelf. Regular ketchup and mustard. Meat flavorings and tenderizers. Bouillon cubes. Hot sauce. Pre-made or packaged marinades. Pre-made or packaged taco seasonings. Relishes. Regular salad dressings. Salsa. Other foods Salted popcorn and pretzels. Corn chips and puffs. Potato and tortilla chips. Canned or dried soups. Pizza. Frozen entrees and pot pies. The items listed above may not be a complete list of foods and beverages you should avoid. Contact a dietitian for more information. Summary Eating less sodium can help lower your blood pressure, reduce swelling, and protect your heart, liver, and kidneys. Most people on this plan should limit their sodium intake to 1,500-2,000 mg (milligrams) of sodium each day. Canned, boxed, and frozen foods are high in sodium. Restaurant foods, fast foods, and pizza are also very high in sodium. You also get sodium by adding salt to food. Try to cook at home, eat more fresh fruits and vegetables, and eat less fast food and canned, processed, or prepared foods. This information is not intended to replace advice given to you by your health care provider. Make sure you discuss any questions you have with your health care provider. Document Revised: 12/24/2019 Document Reviewed: 10/20/2019 Elsevier Patient  Education  2022 [place] cardiac CT will be scheduled at one of the below locations:   [facility] [address] [place], [place] [address] [phone]  [place] [address] [place], [place] [address] [phone]  If scheduled at [facility], please arrive at the [facility] main entrance (entrance A) of [facility] 30 minutes prior to test start time. You can use the FREE valet parking offered at the main entrance (encouraged to control the heart rate for the test) Proceed to the [facility] Radiology Department (first floor) to check-in and test prep.  If scheduled at [facility], please arrive 15 mins early for check-in and test prep.  Please follow these instructions carefully (unless otherwise directed):  On the Night Before the Test: Be sure to Drink plenty of water. Do not consume any caffeinated/decaffeinated beverages or chocolate 12 hours prior to your test. Do not take any antihistamines  12 hours prior to your test. If the patient has contrast allergy: Patient will need a prescription for Prednisone and very clear instructions (as follows): Prednisone 50 mg - take 13 hours prior to test Take another Prednisone 50 mg 7 hours prior to test Take another Prednisone 50 mg 1 hour prior to test Take Benadryl 50 mg 1 hour prior to test Patient must complete all four doses of above prophylactic medications. Patient will need a ride after test due to Benadryl.  On the Day of the Test: Drink plenty of water until 1 hour prior to the test. Do not eat any food 4 hours prior to the test. You may take your regular medications prior to the test.  Take metoprolol (Lopressor) two hours prior to test. HOLD Furosemide/Hydrochlorothiazide morning of the test. FEMALES- please wear underwire-free bra if available, avoid dresses & tight  clothing  After the Test: Drink plenty of water. After receiving IV contrast, you may experience a mild flushed feeling. This is normal. On occasion, you may experience a mild rash up to 24 hours after the test. This is not dangerous. If this occurs, you can take Benadryl 25 mg and increase your fluid intake. If you experience trouble breathing, this can be serious. If it is severe call 911 IMMEDIATELY. If it is mild, please call our office. If you take any of these medications: Glipizide/Metformin, Avandament, Glucavance, please do not take 48 hours after completing test unless otherwise instructed.  Please allow 2-4 weeks for scheduling of routine cardiac CTs. Some insurance companies require a pre-authorization which may delay scheduling of this test.   For non-scheduling related questions, please contact the cardiac imaging nurse navigator should you have any questions/concerns: Marchia Bond, Cardiac Imaging Nurse Navigator Gordy Clement, Cardiac Imaging Nurse Navigator [facility] Direct Office Dial: [phone]   For scheduling needs, including cancellations and rescheduling, please call [place], [phone].     Adopting a Healthy Lifestyle.  Know what a healthy weight is for you (roughly BMI <25) and aim to maintain this   Aim for 7+ servings of fruits and vegetables daily   65-80+ fluid ounces of water or unsweet tea for healthy kidneys   Limit to max 1 drink of alcohol per day; avoid smoking/tobacco   Limit animal fats in diet for cholesterol and heart health - choose grass fed whenever available   Avoid highly processed foods, and foods high in saturated/trans fats   Aim for low stress - take time to unwind and care for your mental health   Aim for 150 min of moderate intensity exercise weekly for heart health, and weights twice weekly for bone health   Aim for 7-9 hours of sleep daily   When it comes to diets, agreement about the  perfect plan isnt easy to find, even among the experts. Experts at the [place] [employer] developed an idea known as the Healthy Eating Plate. Just imagine a plate divided into logical, healthy portions.   The emphasis is on diet quality:   Load up on vegetables and fruits - one-half of your plate: Aim for color and variety, and remember that potatoes dont count.   Go for whole grains - one-quarter of your plate: Whole wheat, barley, wheat berries, quinoa, oats, brown rice, and foods made with them. If you want pasta, go with whole wheat pasta.   Protein power - one-quarter of your plate: Fish, chicken, beans, and nuts are all healthy, versatile protein  sources. Limit red meat.   The diet, however, does go beyond the plate, offering a few other suggestions.   Use healthy plant oils, such as olive, canola, soy, corn, sunflower and peanut. Check the labels, and avoid partially hydrogenated oil, which have unhealthy trans fats.   If youre thirsty, drink water. Coffee and tea are good in moderation, but skip sugary drinks and limit milk and dairy products to one or two daily servings.   The type of carbohydrate in the diet is more important than the amount. Some sources of carbohydrates, such as vegetables, fruits, whole grains, and beans-are healthier than others.   Finally, stay active  Signed, Berniece Salines, DO  12/07/2021 9:46 PM    [facility]

## 2021-12-10 DIAGNOSIS — E785 Hyperlipidemia, unspecified: Secondary | ICD-10-CM | POA: Diagnosis not present

## 2021-12-10 DIAGNOSIS — R072 Precordial pain: Secondary | ICD-10-CM | POA: Diagnosis not present

## 2021-12-10 DIAGNOSIS — R7303 Prediabetes: Secondary | ICD-10-CM | POA: Diagnosis not present

## 2021-12-10 DIAGNOSIS — Z01818 Encounter for other preprocedural examination: Secondary | ICD-10-CM | POA: Diagnosis not present

## 2021-12-10 LAB — BASIC METABOLIC PANEL
BUN/Creatinine Ratio: 14 (ref 12–28)
BUN: 11 mg/dL (ref 8–27)
CO2: 23 mmol/L (ref 20–29)
Calcium: 10 mg/dL (ref 8.7–10.3)
Chloride: 104 mmol/L (ref 96–106)
Creatinine, Ser: 0.77 mg/dL (ref 0.57–1.00)
Glucose: 100 mg/dL — ABNORMAL HIGH (ref 70–99)
Potassium: 4.7 mmol/L (ref 3.5–5.2)
Sodium: 139 mmol/L (ref 134–144)
eGFR: 88 mL/min/{1.73_m2} (ref >59)

## 2021-12-10 LAB — MAGNESIUM: Magnesium: 2.1 mg/dL (ref 1.6–2.3)

## 2021-12-13 DIAGNOSIS — I10 Essential (primary) hypertension: Secondary | ICD-10-CM | POA: Diagnosis not present

## 2021-12-13 DIAGNOSIS — E782 Mixed hyperlipidemia: Secondary | ICD-10-CM | POA: Diagnosis not present

## 2021-12-13 DIAGNOSIS — F411 Generalized anxiety disorder: Secondary | ICD-10-CM | POA: Diagnosis not present

## 2021-12-13 DIAGNOSIS — R5383 Other fatigue: Secondary | ICD-10-CM | POA: Diagnosis not present

## 2021-12-18 ENCOUNTER — Telehealth (HOSPITAL_COMMUNITY): Payer: Self-pay | Admitting: Emergency Medicine

## 2021-12-18 NOTE — Telephone Encounter (Signed)
Attempted to call patient regarding upcoming cardiac CT appointment. °Left message on voicemail with name and callback number °Chade Pitner RN Navigator Cardiac Imaging °[facility] °[phone] Office °[phone] Cell ° °

## 2021-12-18 NOTE — Telephone Encounter (Signed)
Pt returning phone call regarding upcoming cardiac imaging study; pt verbalizes understanding of appt date/time, parking situation and where to check in, pre-test NPO status and medications ordered, and verified current allergies; name and call back number provided for further questions should they arise Marchia Bond RN Navigator Cardiac Imaging [facility] [phone] office [phone] cell  Arrival 9:00a Difficult IV  100mg  metoprolol + 13 hr prep

## 2021-12-19 ENCOUNTER — Ambulatory Visit (HOSPITAL_COMMUNITY)
Admission: RE | Admit: 2021-12-19 | Discharge: 2021-12-19 | Disposition: A | Discharge status: Home / Self Care | Payer: Medicare HMO | Source: Ambulatory Visit | Attending: Cardiology | Admitting: Cardiology

## 2021-12-19 ENCOUNTER — Other Ambulatory Visit: Payer: Self-pay

## 2021-12-19 ENCOUNTER — Encounter (HOSPITAL_COMMUNITY): Payer: Self-pay

## 2021-12-19 ENCOUNTER — Encounter: Payer: Self-pay | Admitting: Cardiology

## 2021-12-19 DIAGNOSIS — R072 Precordial pain: Secondary | ICD-10-CM | POA: Insufficient documentation

## 2021-12-19 MED ORDER — NITROGLYCERIN 0.4 MG SL SUBL
SUBLINGUAL_TABLET | SUBLINGUAL | Status: AC
  Filled 2021-12-19: qty 2

## 2021-12-19 MED ORDER — IOHEXOL 350 MG/ML SOLN
100.0000 mL | Freq: Once | INTRAVENOUS | Status: AC | PRN
  Administered 2021-12-19: 100 mL via INTRAVENOUS

## 2021-12-19 MED ORDER — NITROGLYCERIN 0.4 MG SL SUBL
0.8000 mg | SUBLINGUAL_TABLET | Freq: Once | SUBLINGUAL | Status: AC
  Administered 2021-12-19: 0.8 mg via SUBLINGUAL

## 2021-12-24 ENCOUNTER — Other Ambulatory Visit: Payer: Self-pay

## 2021-12-24 ENCOUNTER — Encounter (HOSPITAL_BASED_OUTPATIENT_CLINIC_OR_DEPARTMENT_OTHER): Payer: Self-pay | Admitting: Internal Medicine

## 2021-12-24 ENCOUNTER — Telehealth: Payer: Self-pay | Admitting: Internal Medicine

## 2021-12-24 ENCOUNTER — Ambulatory Visit (INDEPENDENT_AMBULATORY_CARE_PROVIDER_SITE_OTHER): Payer: Medicare HMO | Admitting: Internal Medicine

## 2021-12-24 ENCOUNTER — Telehealth (HOSPITAL_BASED_OUTPATIENT_CLINIC_OR_DEPARTMENT_OTHER): Payer: Self-pay

## 2021-12-24 VITALS — BP 145/80 | HR 62 | Ht 64.0 in | Wt 255.2 lb

## 2021-12-24 DIAGNOSIS — E7849 Other hyperlipidemia: Secondary | ICD-10-CM

## 2021-12-24 DIAGNOSIS — I2584 Coronary atherosclerosis due to calcified coronary lesion: Secondary | ICD-10-CM

## 2021-12-24 DIAGNOSIS — I2510 Atherosclerotic heart disease of native coronary artery without angina pectoris: Secondary | ICD-10-CM

## 2021-12-24 DIAGNOSIS — E7801 Familial hypercholesterolemia: Secondary | ICD-10-CM | POA: Diagnosis not present

## 2021-12-24 DIAGNOSIS — R002 Palpitations: Secondary | ICD-10-CM | POA: Diagnosis not present

## 2021-12-24 MED ORDER — REPATHA SURECLICK 140 MG/ML ~~LOC~~ SOAJ: 1.0000 | SUBCUTANEOUS | 11 refills | Status: DC

## 2021-12-24 NOTE — Telephone Encounter (Signed)
Genetic test for dyslipidemia/ASCVD ordered ([facility]) Cheek swab completed in office Specimen and necessary paperwork mailed. ID: [id number]

## 2021-12-24 NOTE — Telephone Encounter (Deleted)
PA Case: [id number], Status: Approved, Coverage Starts on: 12/24/2021 12:00:00 AM, Coverage Ends on: 06/22/2022 12:00:00 AM.

## 2021-12-24 NOTE — Progress Notes (Signed)
LIPID CLINIC CONSULT NOTE  Chief Complaint:  Manage dyslipidemia  Primary Care Physician: Antony Contras, MD  Primary Cardiologist:  Berniece Salines, DO  HPI:  Paula [name] is a 61 y.o. female who is being seen today for the evaluation of dyslipidemia at the request of Tobb, Kardie, DO.  This is a pleasant 61 year old female kindly referred for evaluation management of dyslipidemia by Dr. Harriet Masson.  She has a history of high cholesterol in the past and unfortunately has been intolerant to statins causing significant myalgias.  She also has a history of obesity, chronic pain and fibromyalgia, family history of heart disease and other medication intolerances.  Recently she underwent CT coronary angiography which demonstrated mild nonobstructive coronary disease with a calcium score of 105, 88th percentile for age and sex matched controls.  She was informed of those results today.  She did have recent lipids however showing a significantly elevated cholesterol with total of 384, triglycerides 136, HDL 53 and LDL 304.  She reports diet that seems to have a significant amount of saturated fats but does not eat fried food regularly however does use a lot of sources of saturated fat and would benefit from dietary modification.  That being said given her severely elevated cholesterol, its likely she has familial hyperlipidemia.  She also reported some palpitations today and an EKG was performed.  This was personally reviewed and indicates sinus rhythm, nonspecific T wave changes at 62.  PMHx:  Past Medical History:  Diagnosis Date   Anxiety    pt stated anxiety episodes resemble stroke symptoms   Chronic lower back pain    Conversion disorder    Depression    Diverticulitis    Expressive aphasia 11/15/2016   Fibromyalgia    GERD (gastroesophageal reflux disease)    Heart murmur    High cholesterol    History of gastric ulcer    History of hiatal hernia    History of kidney stones    History of  thyroid nodule    Hyperlipidemia    Hypertension    IBS (irritable bowel syndrome)    Intermittent vertigo    Migraine    "a few times/year now; maybe" (11/15/2016)   Obesity    OSA on CPAP    wears CPAP nightly ;study in epic 02-01-2014 (11/15/2016)   Osteoarthritis    "knees, hands, back, hips" (11/15/2016)   Pneumonia X 1   hx of   Psychogenic tremor    intermittant head tremor-(11/15/2016)   Refusal of blood transfusions as patient is Jehovah's Witness    RLS (restless legs syndrome)    Sleep apnea    CPAP   Urinary incontinence    Vertigo     Past Surgical History:  Procedure Laterality Date   ABDOMINAL HYSTERECTOMY  1990   Partial, still has ovaries   ANTERIOR CERVICAL DECOMP/DISCECTOMY FUSION  02/10/2004   C4 -- C7   BACK SURGERY     CARDIOVASCULAR STRESS TEST  2017   Negative   CESAREAN SECTION  1977; 1982   COLONOSCOPY     CYSTOSCOPY W/ RETROGRADES Right 06/07/2015   Procedure: CYSTOSCOPY WITH RETROGRADE PYELOGRAM;  Surgeon: Ardis Hughs, MD;  Location: [facility];  Service: Urology;  Laterality: Right;   CYSTOSCOPY WITH URETEROSCOPY AND STENT PLACEMENT Right 06/07/2015   Procedure: RIGHT URETEROSCOPY, LASER LITHOTRIPSY, STONE REMOVAL AND RIGHT URETERAL STENT PLACEMENT;  Surgeon: Ardis Hughs, MD;  Location: [facility];  Service: Urology;  Laterality: Right;   DIRECT LARYNGOSCOPY N/A 10/29/2021   Procedure: DIRECT LARYNGOSCOPY WITH BIOPSY;  Surgeon: Melida Quitter, MD;  Location: [place];  Service: ENT;  Laterality: N/A;   HERNIA REPAIR     HOLMIUM LASER APPLICATION N/A 72/53/6644   Procedure: HOLMIUM LASER APPLICATION;  Surgeon: Ardis Hughs, MD;  Location: [facility];  Service: Urology;  Laterality: N/A;   LAPAROSCOPIC CHOLECYSTECTOMY  10/07/2000   LIPOMA EXCISION  01/2017   from neck   UMBILICAL HERNIA REPAIR  age 27   UPPER GI ENDOSCOPY      FAMHx:  Family History  Problem Relation Age of  Onset   Hyperlipidemia Mother    Hypertension Mother    Sleep apnea Mother    Osteoporosis Mother    Hypertension Father    Heart disease Father    Syncope episode Father    Colon cancer Father    Atrial fibrillation Father    Hypertension Sister    Sleep apnea Sister    Sleep apnea Brother    Hypertension Brother    Colon cancer Maternal Aunt    Lung cancer Maternal Aunt    Hypertension Maternal Grandmother    CVA Maternal Grandmother    Pancreatic cancer Maternal Grandfather    Colon cancer Maternal Grandfather    Hypertension Maternal Grandfather    Lung cancer Maternal Grandfather    Throat cancer Maternal Grandfather    Colon cancer Paternal Grandfather    Esophageal cancer Neg Hx    Liver cancer Neg Hx    Stomach cancer Neg Hx     SOCHx:   reports that she quit smoking about 13 years ago. Her smoking use included cigarettes. She has a 12.50 pack-year smoking history. She has never used smokeless tobacco. She reports that she does not currently use alcohol. She reports that she does not use drugs.  ALLERGIES:  Allergies  Allergen Reactions   Detrol [Tolterodine] Other (See Comments)    slurred speech, tremors, dizziness   Gabapentin Palpitations and Other (See Comments)    Wt gain, tremor   Aspirin Other (See Comments)    Avoids--- history of gastric ulcers    Contrast Media [Iodinated Contrast Media] Hives, Itching and Rash    CT contrast-Needed to take benadryl    Hydrocodone-Acetaminophen Nausea And Vomiting   Imitrex [Sumatriptan] Other (See Comments)    Body hurts   Oxycodone Nausea And Vomiting   Vicodin Hp [Hydrocodone-Acetaminophen] Nausea And Vomiting   Avelox [Moxifloxacin] Itching    ROS: Pertinent items noted in HPI and remainder of comprehensive ROS otherwise negative.  HOME MEDS: Current Outpatient Medications on File Prior to Visit  Medication Sig Dispense Refill   acetaminophen (TYLENOL) 500 MG tablet Take 1,000 mg by mouth every 6 (six)  hours as needed for headache (pain).     albuterol (VENTOLIN HFA) 108 (90 Base) MCG/ACT inhaler 2 puffs every 6 (six) hours as needed for shortness of breath or wheezing.     Cholecalciferol (VITAMIN D3 PO) Take 1 tablet by mouth at bedtime.     Cyanocobalamin (VITAMIN B 12 PO) Take 1 tablet by mouth daily.     cyclobenzaprine (FLEXERIL) 10 MG tablet Take 1 tablet by mouth at bedtime as needed for muscle spasms.     dexlansoprazole (DEXILANT) 60 MG capsule Take 60 mg by mouth daily.     dicyclomine (BENTYL) 20 MG tablet Take 20 mg by mouth in the morning, at noon, in the evening, and at bedtime.  diltiazem (CARDIZEM CD) 120 MG 24 hr capsule Take 1 capsule (120 mg total) by mouth daily. 90 capsule 3   ELDERBERRY PO Take by mouth.     famotidine (PEPCID) 20 MG tablet TAKE 1 TABLET AT BEDTIME 90 tablet 1   escitalopram (LEXAPRO) 10 MG tablet Take by mouth. (Patient not taking: Reported on 12/24/2021)     SYMBICORT 160-4.5 MCG/ACT inhaler Inhale 1 puff into the lungs daily as needed (asthma). (Patient not taking: Reported on 12/24/2021)     Current Facility-Administered Medications on File Prior to Visit  Medication Dose Route Frequency Provider Last Rate Last Admin   0.9 %  sodium chloride infusion  500 mL Intravenous Once Irene Shipper, MD        LABS/IMAGING: No results found for this or any previous visit (from the past 48 hour(s)). No results found.  LIPID PANEL:    Component Value Date/Time   CHOL 281 (H) 07/01/2020 0206   TRIG 175 (H) 07/01/2020 0206   HDL 44 07/01/2020 0206   CHOLHDL 6.4 07/01/2020 0206   VLDL 35 07/01/2020 0206   LDLCALC 202 (H) 07/01/2020 0206   LDLDIRECT 170.1 01/08/2010 0943    WEIGHTS: Wt Readings from Last 3 Encounters:  12/24/21 255 lb 3.2 oz (115.8 kg)  12/07/21 257 lb 3.2 oz (116.7 kg)  10/29/21 258 lb 8 oz (117.3 kg)    VITALS: BP (!) 145/80    Pulse 62    Ht 5\' 4"  (1.626 m)    Wt 255 lb 3.2 oz (115.8 kg)    SpO2 99%    BMI 43.80 kg/m    EXAM: General appearance: alert, no distress, and morbidly obese Neck: no carotid bruit, no JVD, and thyroid not enlarged, symmetric, no tenderness/mass/nodules Lungs: clear to auscultation bilaterally Heart: regular rate and rhythm Abdomen: soft, non-tender; bowel sounds normal; no masses,  no organomegaly and obese Extremities: extremities normal, atraumatic, no cyanosis or edema Pulses: 2+ and symmetric Skin: Skin color, texture, turgor normal. No rashes or lesions Neurologic: Grossly normal Psych: Pleasant  *Examination chaperoned by Sheral Apley, RN.  EKG: Sinus rhythm, nonspecific T wave changes at 62- personally reviewed  ASSESSMENT: Probable familial hyperlipidemia, LDL greater than 300, possible homozygous FH Family history of coronary disease Atherogenic diet Morbid obesity Statin intolerance-myalgias  PLAN: 1.   Ms. Bodner has mild nonobstructive coronary disease but in age advanced calcium score.  With her very high cholesterol she has probable familial hyperlipidemia and possible homozygous FH although I would argue that her relative lack of extensive coronary disease would be unlikely with homozygous FH.  There is probably a strong atherogenic component to her lipids.  That being said I like to get genetic testing and she is agreeable to that.  In addition she is a good candidate for PCSK9 inhibitor given familial hyperlipidemia as well as statin intolerance.  We will plan Repatha 140 mg every 2 weeks.  She may likely need additional therapies.  Weight loss, more physical activity and less saturated fat in her diet will be helpful as well.  We will plan follow-up with repeat lipids in about 3 to 4 months.  Thanks again for the kind referral.  Pixie Casino, MD, FACC, [place] Director of the Advanced Lipid Disorders &  Cardiovascular Risk Reduction Clinic Diplomate of the American Board of Clinical Lipidology Attending  Cardiologist  Direct Dial: [phone]   Fax: [phone]  Website:  [email or web address]  Chrissie Noa  C Sunya Humbarger 12/24/2021, 1:10 PM

## 2021-12-24 NOTE — Patient Instructions (Signed)
Medication Instructions:  Dr. Debara Pickett recommends Repatha 140mg /mL (PCSK9). This is an injectable cholesterol medication self-administered once every 14 days. This medication will likely need prior approval with your insurance company, which we will work on. If the medication is not approved initially, we may need to do an appeal with your insurance.   Administer medication in area of fatty tissue such as abdomen, outer thigh, back of upper arm - and rotate site with each injection Store medication in refrigerator until ready to administer - allow to sit at room temp for 30 mins - 1 hour prior to injection Dispose of medication in a SHARPS container - your pharmacy should be able to direct you on this and proper disposal   If you need a co-pay card for Repatha: [email or web address]-[email or web address] >> paying for Repatha or red box that says "[place]" in top right If you need a co-pay card for Praluent: WedMap.it >> starting & paying for Praluent  Patient Assistance:   The PAN Foundation: https://www.panfoundation.org/disease-funds/hypercholesterolemia/ -- can sign up for wait list  The [employer] Well foundation offers assistance to help pay for medication copays.  They will cover copays for all cholesterol lowering meds, including statins, fibrates, omega-3 fish oils like Vascepa, ezetimibe, Repatha, Praluent, Nexletol, Nexlizet.  The cards are usually good for $2,500 or 12 months, whichever comes first. Go to healthwellfoundation.org Click on Apply Now Answer questions as to whom is applying (patient or representative) Your disease fund will be hypercholesterolemia - Medicare access They will ask questions about finances and which medications you are taking for cholesterol When you submit, the approval is usually within minutes.  You will need to print the card information from the site You will need to show this information to your pharmacy, they will bill your Medicare Part D plan first -then bill Health  Well --for the copay.   You can also call them at [phone], although the hold times can be quite long.     *If you need a refill on your cardiac medications before your next appointment, please call your pharmacy*   Lab Work: FASTING lab work to check cholesterol in about 3-4 months  -- complete 1 week before your next appointment   If you have labs (blood work) drawn today and your tests are completely normal, you will receive your results only by: [place] (if you have MyChart) OR A paper copy in the mail If you have any lab test that is abnormal or we need to change your treatment, we will call you to review the results.   Testing/Procedures: NONE   Follow-Up: At [facility], you and your health needs are our priority.  As part of our continuing mission to provide you with exceptional heart care, we have created designated Provider Care Teams.  These Care Teams include your primary Cardiologist (physician) and Advanced Practice Providers (APPs -  Physician Assistants and Nurse Practitioners) who all work together to provide you with the care you need, when you need it.  We recommend signing up for the patient portal called "MyChart".  Sign up information is provided on this After Visit Summary.  MyChart is used to connect with patients for Virtual Visits (Telemedicine).  Patients are able to view lab/test results, encounter notes, upcoming appointments, etc.  Non-urgent messages can be sent to your provider as well.   To learn more about what you can do with MyChart, go to [email or web address].    Your next appointment:   3-4 months with Dr.  Hilty -- lipid clinic

## 2021-12-24 NOTE — Telephone Encounter (Signed)
PA Approved by [facility], [place] Case: [id number], coverage starts on 12/24/2021 12:00 AM, Coverage ends on: 06/22/2022 AM, RX: [id number]

## 2021-12-24 NOTE — Telephone Encounter (Signed)
Patient notified via My Chart

## 2021-12-24 NOTE — Telephone Encounter (Signed)
PA submitted by [facility], Key: [id number].

## 2021-12-31 ENCOUNTER — Other Ambulatory Visit: Payer: Self-pay

## 2021-12-31 MED ORDER — ROSUVASTATIN CALCIUM 5 MG PO TABS: 5.0000 mg | ORAL_TABLET | Freq: Every day | ORAL | 3 refills | Status: DC

## 2021-12-31 NOTE — Progress Notes (Signed)
Prescription sent to pharmacy.

## 2022-01-08 NOTE — Telephone Encounter (Signed)
I was able to speak with the patient. Please send Imdur 30 mg daily and set her for a 12 week follow up.

## 2022-01-09 DIAGNOSIS — R1313 Dysphagia, pharyngeal phase: Secondary | ICD-10-CM | POA: Diagnosis not present

## 2022-01-09 MED ORDER — ISOSORBIDE MONONITRATE ER 30 MG PO TB24: 30.0000 mg | ORAL_TABLET | Freq: Every day | ORAL | 3 refills | Status: DC

## 2022-01-09 NOTE — Telephone Encounter (Signed)
Rx sent to pharmacy - update sent to patient via [place]

## 2022-01-09 NOTE — Addendum Note (Signed)
Addended by: Fidel Levy on: 01/09/2022 09:09 AM   Modules accepted: Orders

## 2022-01-10 ENCOUNTER — Encounter: Payer: Self-pay | Admitting: Internal Medicine

## 2022-01-17 DIAGNOSIS — G4733 Obstructive sleep apnea (adult) (pediatric): Secondary | ICD-10-CM | POA: Diagnosis not present

## 2022-01-18 ENCOUNTER — Other Ambulatory Visit: Payer: Self-pay

## 2022-01-18 MED ORDER — RANOLAZINE ER 500 MG PO TB12: 500.0000 mg | ORAL_TABLET | Freq: Two times a day (BID) | ORAL | 3 refills | Status: DC

## 2022-01-18 NOTE — Telephone Encounter (Signed)
MyChart message sent to pt and prescription sent to pharmacy.

## 2022-01-18 NOTE — Progress Notes (Signed)
Prescription sent to pharmacy. Medication list updated.

## 2022-01-25 ENCOUNTER — Encounter: Payer: Self-pay | Admitting: Cardiology

## 2022-01-28 ENCOUNTER — Other Ambulatory Visit: Payer: Self-pay

## 2022-01-28 NOTE — Progress Notes (Signed)
Referral placed per Dr.Tobb's request.  

## 2022-02-02 ENCOUNTER — Other Ambulatory Visit: Payer: Self-pay

## 2022-02-02 ENCOUNTER — Encounter (HOSPITAL_COMMUNITY): Payer: Self-pay

## 2022-02-02 ENCOUNTER — Emergency Department (HOSPITAL_COMMUNITY)
Admission: EM | Admit: 2022-02-02 | Discharge: 2022-02-02 | Disposition: A | Discharge status: Home / Self Care | Payer: Medicare HMO | Attending: Emergency Medicine | Admitting: Emergency Medicine

## 2022-02-02 ENCOUNTER — Emergency Department (HOSPITAL_COMMUNITY): Payer: Medicare HMO

## 2022-02-02 DIAGNOSIS — K219 Gastro-esophageal reflux disease without esophagitis: Secondary | ICD-10-CM | POA: Insufficient documentation

## 2022-02-02 DIAGNOSIS — R079 Chest pain, unspecified: Secondary | ICD-10-CM | POA: Diagnosis not present

## 2022-02-02 DIAGNOSIS — R0789 Other chest pain: Secondary | ICD-10-CM | POA: Diagnosis not present

## 2022-02-02 LAB — CBC
HCT: 39 % (ref 36.0–46.0)
Hemoglobin: 12.7 g/dL (ref 12.0–15.0)
MCH: 29.5 pg (ref 26.0–34.0)
MCHC: 32.6 g/dL (ref 30.0–36.0)
MCV: 90.7 fL (ref 80.0–100.0)
Platelets: 367 10*3/uL (ref 150–400)
RBC: 4.3 MIL/uL (ref 3.87–5.11)
RDW: 12.2 % (ref 11.5–15.5)
WBC: 11.5 10*3/uL — ABNORMAL HIGH (ref 4.0–10.5)
nRBC: 0 % (ref 0.0–0.2)

## 2022-02-02 LAB — BASIC METABOLIC PANEL
Anion gap: 10 (ref 5–15)
BUN: 17 mg/dL (ref 8–23)
CO2: 25 mmol/L (ref 22–32)
Calcium: 9.7 mg/dL (ref 8.9–10.3)
Chloride: 104 mmol/L (ref 98–111)
Creatinine, Ser: 0.87 mg/dL (ref 0.44–1.00)
GFR, Estimated: >60 mL/min (ref >60)
Glucose, Bld: 121 mg/dL — ABNORMAL HIGH (ref 70–99)
Potassium: 4 mmol/L (ref 3.5–5.1)
Sodium: 139 mmol/L (ref 135–145)

## 2022-02-02 LAB — D-DIMER, QUANTITATIVE: D-Dimer, Quant: <0.27 ug/mL-FEU (ref 0.00–0.50)

## 2022-02-02 LAB — TROPONIN I (HIGH SENSITIVITY)
Troponin I (High Sensitivity): 4 ng/L (ref <18)
Troponin I (High Sensitivity): 5 ng/L (ref <18)

## 2022-02-02 MED ORDER — ALUM & MAG HYDROXIDE-SIMETH 200-200-20 MG/5ML PO SUSP
30.0000 mL | Freq: Once | ORAL | Status: AC
Start: 2022-02-02 — End: 2022-02-02
  Administered 2022-02-02: 30 mL via ORAL
  Filled 2022-02-02: qty 30

## 2022-02-02 MED ORDER — HYOSCYAMINE SULFATE 0.125 MG SL SUBL
0.2500 mg | SUBLINGUAL_TABLET | Freq: Once | SUBLINGUAL | Status: AC
  Administered 2022-02-02: 0.25 mg via SUBLINGUAL
  Filled 2022-02-02: qty 2

## 2022-02-02 NOTE — Discharge Instructions (Signed)
The testing today indicates that your symptoms are caused by GERD.  We gave you an antacid which helped your discomfort.  Continue taking an antacid such as Maalox, 30 mL, before meals and at bedtime to help control your symptoms.  Follow-up with your GI doctor soon as possible for further care and treatment.  There is no evidence of cardiac or pulmonary abnormalities which would have caused your pain.  Continue taking your usual medications.  Follow-up with your doctors as scheduled. ?

## 2022-02-02 NOTE — ED Notes (Signed)
Patient states she is having some anxiety which is causing her tachypnea. Patient coached on breathing with some relief.  ?

## 2022-02-02 NOTE — ED Provider Notes (Signed)
[facility] DEPT Provider Note   CSN: [id number] Arrival date & time: 02/02/22  2001     History  Chief Complaint  Patient presents with   Chest Pain    Paula [name] is a 61 y.o. female.  HPI She presents for evaluation of intermittent periods of chest pain, which occur as "spasm."  Over the last 2 months.  During this time she has been seen and evaluated by her cardiologist.  She is worried that she is "in the early stages of heart attack."  She denies diaphoresis, nausea, vomiting, focal weakness or paresthesia.   She is being actively treated by her cardiologist, has been prescribed Repatha, and recently changed from Imdur to Ranexa to control anginal symptoms.  She is followed closely for familial hyperlipidemia.  She was apparently referred to cardiology regarding the lipid disorder.  She does not have known obstructive coronary disease.  Cardiac catheterization in January 2023 and showed minimal nonobstructive coronary artery disease.  She previously was recently her otolaryngologist, who suggested she start a PPI for reflux symptoms.  Patient apparently did not want to do that.   Home Medications Prior to Admission medications   Medication Sig Start Date End Date Taking? Authorizing Provider  acetaminophen (TYLENOL) 500 MG tablet Take 1,000 mg by mouth every 6 (six) hours as needed for headache (pain).   Yes [provider]  Cholecalciferol (VITAMIN D3 PO) Take 1 tablet by mouth at bedtime.   Yes [provider]  dexlansoprazole (DEXILANT) 60 MG capsule Take 60 mg by mouth at bedtime. 09/17/21  Yes [provider]  dicyclomine (BENTYL) 20 MG tablet Take 20 mg by mouth at bedtime.   Yes [provider]  diltiazem (CARDIZEM CD) 120 MG 24 hr capsule Take 1 capsule (120 mg total) by mouth daily. Patient taking differently: Take 120 mg by mouth at bedtime. 12/07/21  Yes Tobb, Kardie, DO  ELDERBERRY PO Take 2 tablets  by mouth at bedtime.   Yes [provider]  Evolocumab (REPATHA SURECLICK) 188 MG/ML SOAJ Inject 1 Dose into the skin every 14 (fourteen) days. Patient taking differently: Inject 140 mg into the skin every 14 (fourteen) days. Every other Tuesday 12/24/21  Yes Hilty, Nadean Corwin, MD  famotidine (PEPCID) 20 MG tablet TAKE 1 TABLET AT BEDTIME Patient taking differently: Take 20 mg by mouth at bedtime. 11/12/21  Yes Lauraine Rinne, NP  pantoprazole (PROTONIX) 40 MG tablet Take 40 mg by mouth at bedtime. 12/27/21  Yes [provider]  ranolazine (RANEXA) 500 MG 12 hr tablet Take 1 tablet (500 mg total) by mouth 2 (two) times daily. Patient taking differently: Take 500 mg by mouth daily. 01/18/22  Yes Tobb, Kardie, DO  albuterol (VENTOLIN HFA) 108 (90 Base) MCG/ACT inhaler 2 puffs every 6 (six) hours as needed for shortness of breath or wheezing. Patient not taking: Reported on 02/02/2022 01/11/21   [provider]  cyclobenzaprine (FLEXERIL) 10 MG tablet Take 1 tablet by mouth at bedtime as needed for muscle spasms. Patient not taking: Reported on 02/02/2022    [provider]      Allergies    Detrol [tolterodine], Gabapentin, Aspirin, Contrast media [iodinated contrast media], Imitrex [sumatriptan], Oxycodone, Milk-related compounds, Rosuvastatin, Vicodin hp [hydrocodone-acetaminophen], and Avelox [moxifloxacin]    Review of Systems   Review of Systems  Physical Exam Updated Vital Signs BP (!) 187/92    Pulse (!) 58    Temp 98.1 F (36.7 C) (Oral)  Resp 13    Ht '5\' 4"'$  (1.626 m)    Wt 114.3 kg    SpO2 99%    BMI 43.26 kg/m  Physical Exam Vitals and nursing note reviewed.  Constitutional:      General: She is in acute distress (Anxious, breathing rapidly and shallowly.).     Appearance: She is well-developed. She is not ill-appearing.  HENT:     Head: Normocephalic and atraumatic.     Right Ear: External ear normal.     Left Ear: External ear normal.  Eyes:      Conjunctiva/sclera: Conjunctivae normal.     Pupils: Pupils are equal, round, and reactive to light.  Neck:     Trachea: Phonation normal.  Cardiovascular:     Rate and Rhythm: Normal rate and regular rhythm.     Heart sounds: Normal heart sounds.  Pulmonary:     Effort: Pulmonary effort is normal. No respiratory distress.     Breath sounds: Normal breath sounds. No stridor. No wheezing or rhonchi.  Abdominal:     General: There is no distension.     Palpations: Abdomen is soft. There is no mass.     Tenderness: There is abdominal tenderness (Epigastric, mild).  Musculoskeletal:        General: Normal range of motion.     Cervical back: Normal range of motion and neck supple.  Skin:    General: Skin is warm and dry.     Coloration: Skin is not jaundiced or pale.  Neurological:     Mental Status: She is alert and oriented to person, place, and time.     Cranial Nerves: No cranial nerve deficit.     Sensory: No sensory deficit.     Motor: No abnormal muscle tone.     Coordination: Coordination normal.  Psychiatric:        Mood and Affect: Mood normal.        Behavior: Behavior normal.        Thought Content: Thought content normal.        Judgment: Judgment normal.    ED Results / Procedures / Treatments   Labs (all labs ordered are listed, but only abnormal results are displayed) Labs Reviewed  BASIC METABOLIC PANEL - Abnormal; Notable for the following components:      Result Value   Glucose, Bld 121 (*)    All other components within normal limits  CBC - Abnormal; Notable for the following components:   WBC 11.5 (*)    All other components within normal limits  D-DIMER, QUANTITATIVE  TROPONIN I (HIGH SENSITIVITY)  TROPONIN I (HIGH SENSITIVITY)    EKG EKG Interpretation  Date/Time:  Saturday February 02 2022 20:08:48 EST Ventricular Rate:  73 PR Interval:  78 QRS Duration: 135 QT Interval:  410 QTC Calculation: 452 R Axis:   19 Text Interpretation: Sinus  rhythm Short PR interval Probable left atrial enlargement LVH with secondary repolarization abnormality Borderline ST elevation, inferior leads since last tracing no significant change Confirmed by Daleen Bo [phone]) on 02/02/2022 8:22:39 PM  Radiology DG Chest 2 View  Result Date: 02/02/2022 CLINICAL DATA:  Chest pain. EXAM: CHEST - 2 VIEW COMPARISON:  04/24/2021 FINDINGS: Prominent heart size likely accentuated by portable AP technique and positioning.The cardiomediastinal contours are normal. The lungs are clear. Pulmonary vasculature is normal. No consolidation, pleural effusion, or pneumothorax. No acute osseous abnormalities are seen. IMPRESSION: No acute chest findings. Electronically Signed   By: Aurther Loft.D.  On: 02/02/2022 20:41    Procedures Procedures    Medications Ordered in ED Medications  alum & mag hydroxide-simeth (MAALOX/MYLANTA) 200-200-20 MG/5ML suspension 30 mL (30 mLs Oral Given 02/02/22 2051)  hyoscyamine (LEVSIN SL) SL tablet 0.25 mg (0.25 mg Sublingual Given 02/02/22 2052)    ED Course/ Medical Decision Making/ A&P                           Medical Decision Making Presenting for chest pain which is intermittent and ongoing for several months.  Symptoms are mostly consistent with reflux.  She is currently taking a PPI and has a history of IBS, treated with Bentyl.  She was recently started on Ranexa due to the potential atherogenic lipid disorder which she has. She is follow-up closely by cardiology.  Problems Addressed: Nonspecific chest pain: chronic illness or injury    Details: Ongoing for 2 months.  Intermittent periods of pain.  Nonexertional.  Amount and/or Complexity of Data Reviewed External Data Reviewed: labs and notes.    Details: Cardiology medication management and office visit with cardiac catheterization, all early this year. Labs: ordered.    Details: CBC, metabolic panel, troponin, D-dimer Radiology: ordered.    Details: Chest  x-ray-  Risk OTC drugs. Prescription drug management. Risk Details: Patient evaluated today for ongoing chest pain for 2 months, noncardiac in nature with history of GERD.  EKG does not indicate acute abnormalities.  D-dimer negative.  No evidence for PE, pneumonia, ACS or other intrathoracic abnormalities.  She is stable for discharge.  She does not require hospitalization or further ED evaluation.           Final Clinical Impression(s) / ED Diagnoses Final diagnoses:  Nonspecific chest pain  Gastroesophageal reflux disease, unspecified whether esophagitis present    Rx / DC Orders ED Discharge Orders     None         Daleen Bo, MD 02/02/22 2303

## 2022-02-02 NOTE — ED Triage Notes (Signed)
Patient stated she started having chest pain last night. Today it got worse. No nausea, but diarrhea. Chest pain is centralized and radiates to her head.  ?

## 2022-02-02 NOTE — ED Notes (Signed)
Patient returned from xray.

## 2022-02-02 NOTE — ED Notes (Signed)
Patient transported to X-ray 

## 2022-02-07 DIAGNOSIS — Z4689 Encounter for fitting and adjustment of other specified devices: Secondary | ICD-10-CM | POA: Diagnosis not present

## 2022-02-13 ENCOUNTER — Ambulatory Visit (INDEPENDENT_AMBULATORY_CARE_PROVIDER_SITE_OTHER): Payer: Medicare HMO | Admitting: Physician Assistant

## 2022-02-13 ENCOUNTER — Encounter: Payer: Self-pay | Admitting: Physician Assistant

## 2022-02-13 VITALS — BP 120/74 | HR 68 | Ht 64.0 in | Wt 255.0 lb

## 2022-02-13 DIAGNOSIS — K219 Gastro-esophageal reflux disease without esophagitis: Secondary | ICD-10-CM

## 2022-02-13 DIAGNOSIS — R0789 Other chest pain: Secondary | ICD-10-CM | POA: Diagnosis not present

## 2022-02-13 MED ORDER — AMBULATORY NON FORMULARY MEDICATION: 0 refills | Status: DC

## 2022-02-13 NOTE — Patient Instructions (Signed)
If you are age 62 or younger, your body mass index should be between 19-25. Your Body mass index is 43.77 kg/m?[name] If this is out of the aformentioned range listed, please consider follow up with your Primary Care Provider.  ?________________________________________________________ ? ?The [facility] providers would like to encourage you to use [facility] to communicate with providers for non-urgent requests or questions.  Due to long hold times on the telephone, sending your provider a message by [facility] may be a faster and more efficient way to get a response.  Please allow 48 business hours for a response.  Please remember that this is for non-urgent requests.  ?_______________________________________________________ ? ?We have sent the following medications to your pharmacy for you to pick up at your convenience: ? ?START: GI cocktail use 5-47m every 4 to 6 hours as needed ? ?DISCONTINUE: Pantoprazole ? ?Thank you for entrusting me with your care and choosing [facility] ? ?JEllouise Newer PA-C ?

## 2022-02-13 NOTE — Progress Notes (Signed)
Noted  

## 2022-02-13 NOTE — Progress Notes (Signed)
? ?Chief Complaint: Noncardiac chest pain ? ?HPI: ?   Paula [name] is a 61 year old African-American female with a past medical history as listed below including anxiety, fibromyalgia and IBS, known to Dr. Henrene Pastor, who was referred to me by Antony Contras, MD for a complaint of noncardiac chest pain. ?   05/15/2021 EGD with reflux and otherwise normal. ?   05/15/2021 colonoscopy with diverticulosis and internal hemorrhoids and otherwise normal. Repeat recommended 10 years.  ? 09/25/2021 patient saw Dr. Henrene Pastor at that time discussed that she had previously underwent colonoscopy and EGD in the summer which were unrevealing, blood work had been unrevealing.  She continues to complain of postprandial bloating discomfort.  Gastric emptying study have been normal.  IBgard was recommended but she never started this.  Also discussed evaluation in the ER as above with negative CT and labs.  She continued with postprandial fullness and abdominal discomfort.  She did better when she ate less.  She was concerned about weight gain.  That time discussed she had chronic functional abdominal pain with a negative extensive work-up.  She was given reassurance and told to maintain small meals and start exercise and weight loss.  She was given Librax today: 20 minutes before meals as needed. ?   09/26/2021 patient called and discussed that the Librax was too expensive.  She was told to take IBgard 1 before meals. ?   10/05/2021 patient seen in clinic by me and wanted to see if there is any medicine she could stop taking.  She had generalized abdominal pain which was much unchanged since he had seen Dr. Henrene Pastor regardless of adding IBgard 1 tablet with meals.  Her abdomen hurt to the touch.  She was taking Dexilant 60 mg in the morning, Bentyl 20 mg before meals and at bedtime, IBgard 1 capsule before meals and Pepcid 20 mg before bedtime as well as Carafate.  At that time patient had pain to only light palpation and it was thought possibly her  fibromyalgia was contributing.  We stopped her Pantoprazole and Carafate.  She was continued on Dexilant 60 mg in the morning, Bentyl, IBgard and Famotidine.  That time encouraged her to follow with her PCP in regards to her fibromyalgia. ?   01/09/2022 patient saw otolaryngology for throat issues.  At that time it was discussed that her continued swallow problem may be reflux related and she was told to try a PPI but she did not want to try it.  It was recommended she follow-up in 3 months for repeat laryngoscopy. ?   02/02/2022 patient seen in the ER for nonspecific chest pain.  She had previously been seen and evaluated by her cardiologist and given Repatha and recently changed from Imdur to Ranexa to control anginal symptoms.  Labs at that time were normal including a BMP, CBC and troponins. ?   Today, the patient tells me that she developed a tingling pricking/little jab sensation in her chest which sent her to the ER as above.  Tells me that her cardiology team is working on her angina and has been messing with her medicines to see if this helps.  Since being seen in the ER she has not felt the same sensation but does have a constant "pressure", right underneath her sternum which she blames on her "hiatal hernia", continues with occasional reflux symptoms.  Currently taking Dexilant 60 mg a.m., Famotidine 20 mg at bedtime and Pantoprazole 40 mg at bedtime (she was advised to stop this  at last visit but never did).  No real change to chronic symptoms. ?   Also complains of weight gain.  Tells me she has trouble with cravings at night and will "eat anything that is not tied down".  She has followed with [facility] previously but they could never draw her blood and she got tired of being poked every week.  Does tell me she was told recently she has high cholesterol so she has been trying to abide by more healthy diet throughout the day and has started swimming classes. ?   Denies fever, chills, weight loss,  change in bowel habits or symptoms that awaken her from sleep. ? ?Past Medical History:  ?Diagnosis Date  ? Anxiety   ? pt stated anxiety episodes resemble stroke symptoms  ? Chronic lower back pain   ? Conversion disorder   ? Depression   ? Diverticulitis   ? Expressive aphasia 11/15/2016  ? Fibromyalgia   ? GERD (gastroesophageal reflux disease)   ? Heart murmur   ? High cholesterol   ? History of gastric ulcer   ? History of hiatal hernia   ? History of kidney stones   ? History of thyroid nodule   ? Hyperlipidemia   ? Hypertension   ? IBS (irritable bowel syndrome)   ? Intermittent vertigo   ? Migraine   ? "a few times/year now; maybe" (11/15/2016)  ? Obesity   ? OSA on CPAP   ? wears CPAP nightly ;study in epic 02-01-2014 (11/15/2016)  ? Osteoarthritis   ? "knees, hands, back, hips" (11/15/2016)  ? Pneumonia X 1  ? hx of  ? Psychogenic tremor   ? intermittant head tremor-(11/15/2016)  ? Refusal of blood transfusions as patient is Jehovah's Witness   ? RLS (restless legs syndrome)   ? Sleep apnea   ? CPAP  ? Urinary incontinence   ? Vertigo   ? ? ?Past Surgical History:  ?Procedure Laterality Date  ? ABDOMINAL HYSTERECTOMY  1990  ? Partial, still has ovaries  ? ANTERIOR CERVICAL DECOMP/DISCECTOMY FUSION  02/10/2004  ? C4 -- C7  ? BACK SURGERY    ? CARDIOVASCULAR STRESS TEST  2017  ? Negative  ? [place]; 1982  ? COLONOSCOPY    ? CYSTOSCOPY W/ RETROGRADES Right 06/07/2015  ? Procedure: CYSTOSCOPY WITH RETROGRADE PYELOGRAM;  Surgeon: Ardis Hughs, MD;  Location: [facility];  Service: Urology;  Laterality: Right;  ? CYSTOSCOPY WITH URETEROSCOPY AND STENT PLACEMENT Right 06/07/2015  ? Procedure: RIGHT URETEROSCOPY, LASER LITHOTRIPSY, STONE REMOVAL AND RIGHT URETERAL STENT PLACEMENT;  Surgeon: Ardis Hughs, MD;  Location: [facility];  Service: Urology;  Laterality: Right;  ? DIRECT LARYNGOSCOPY N/A 10/29/2021  ? Procedure: DIRECT LARYNGOSCOPY WITH BIOPSY;   Surgeon: Melida Quitter, MD;  Location: [facility];  Service: ENT;  Laterality: N/A;  ? HERNIA REPAIR    ? HOLMIUM LASER APPLICATION N/A 54/27/0623  ? Procedure: HOLMIUM LASER APPLICATION;  Surgeon: Ardis Hughs, MD;  Location: [facility];  Service: Urology;  Laterality: N/A;  ? LAPAROSCOPIC CHOLECYSTECTOMY  10/07/2000  ? LIPOMA EXCISION  01/2017  ? from neck  ? UMBILICAL HERNIA REPAIR  age 61  ? UPPER GI ENDOSCOPY    ? ? ?Current Outpatient Medications  ?Medication Sig Dispense Refill  ? acetaminophen (TYLENOL) 500 MG tablet Take 1,000 mg by mouth every 6 (six) hours as needed for headache (pain).    ? albuterol (  VENTOLIN HFA) 108 (90 Base) MCG/ACT inhaler 2 puffs every 6 (six) hours as needed for shortness of breath or wheezing. (Patient not taking: Reported on 02/02/2022)    ? Cholecalciferol (VITAMIN D3 PO) Take 1 tablet by mouth at bedtime.    ? cyclobenzaprine (FLEXERIL) 10 MG tablet Take 1 tablet by mouth at bedtime as needed for muscle spasms. (Patient not taking: Reported on 02/02/2022)    ? dexlansoprazole (DEXILANT) 60 MG capsule Take 60 mg by mouth at bedtime.    ? dicyclomine (BENTYL) 20 MG tablet Take 20 mg by mouth at bedtime.    ? diltiazem (CARDIZEM CD) 120 MG 24 hr capsule Take 1 capsule (120 mg total) by mouth daily. (Patient taking differently: Take 120 mg by mouth at bedtime.) 90 capsule 3  ? ELDERBERRY PO Take 2 tablets by mouth at bedtime.    ? Evolocumab (REPATHA SURECLICK) 892 MG/ML SOAJ Inject 1 Dose into the skin every 14 (fourteen) days. (Patient taking differently: Inject 140 mg into the skin every 14 (fourteen) days. Every other Tuesday) 2 mL 11  ? famotidine (PEPCID) 20 MG tablet TAKE 1 TABLET AT BEDTIME (Patient taking differently: Take 20 mg by mouth at bedtime.) 90 tablet 1  ? pantoprazole (PROTONIX) 40 MG tablet Take 40 mg by mouth at bedtime.    ? ranolazine (RANEXA) 500 MG 12 hr tablet Take 1 tablet (500 mg total) by mouth 2 (two) times daily. (Patient taking  differently: Take 500 mg by mouth daily.) 180 tablet 3  ? ?Current Facility-Administered Medications  ?Medication Dose Route Frequency Provider Last Rate Last Admin  ? 0.9 %  sodium chloride infusion  500 mL Intr

## 2022-02-26 IMAGING — MG MM DIGITAL SCREENING BILAT W/ TOMO AND CAD
6 of 10 series · 6 of 30 positions shown · non-contrast
Comparison: Previous exam(s).

CLINICAL DATA: Screening.

EXAM:
DIGITAL SCREENING BILATERAL MAMMOGRAM WITH TOMOSYNTHESIS AND CAD
TECHNIQUE: Bilateral screening digital craniocaudal and mediolateral oblique
mammograms were obtained. Bilateral screening digital breast
tomosynthesis was performed. The images were evaluated with
computer-aided detection.

[L MLO synth-2D (1 of 2)]
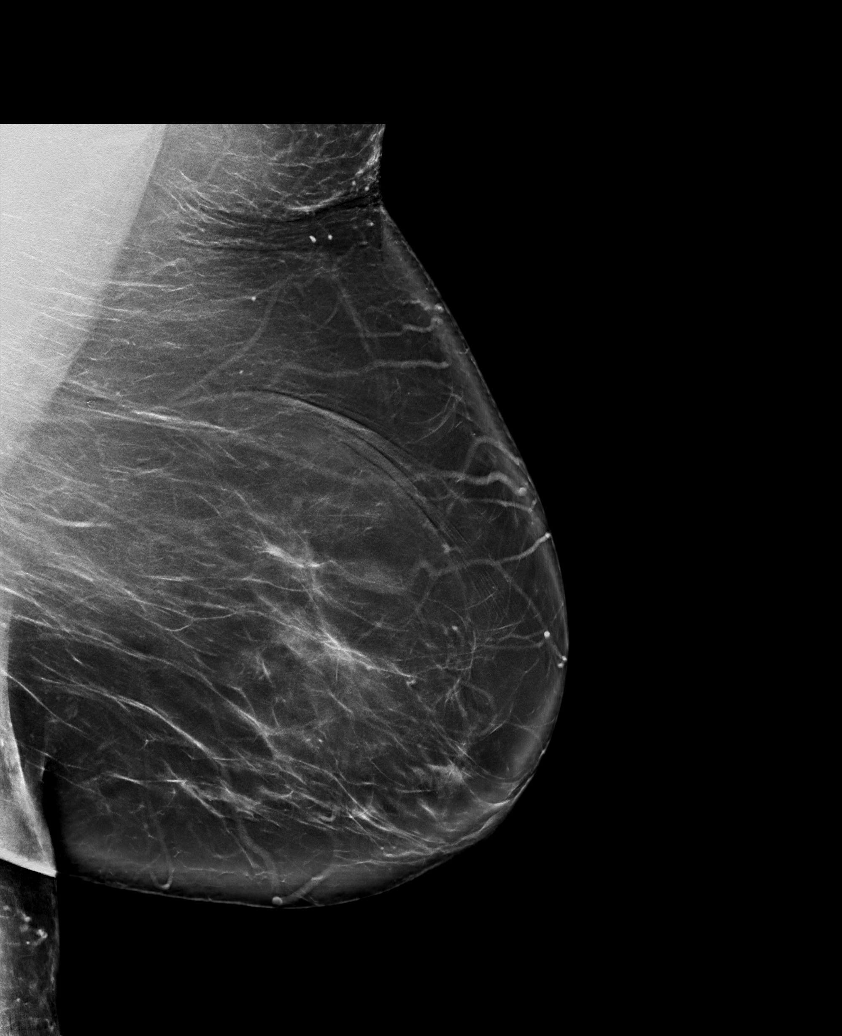

[R MLO synth-2D]
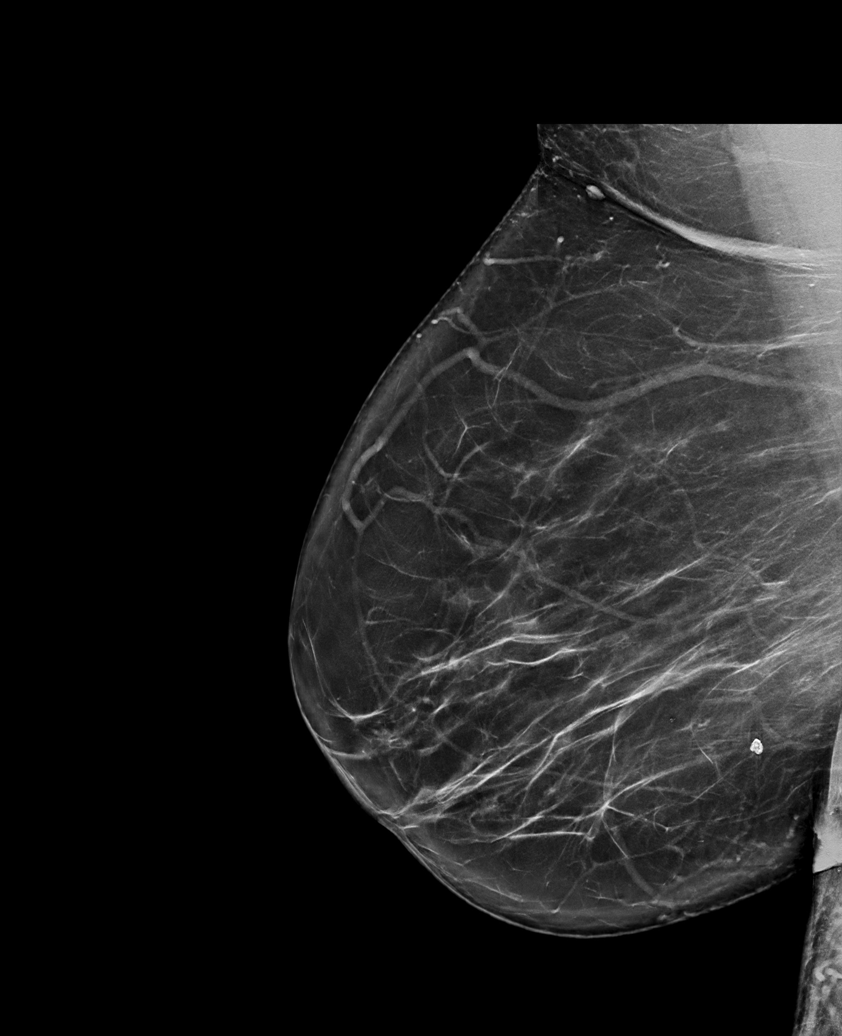

[L CC synth-2D]
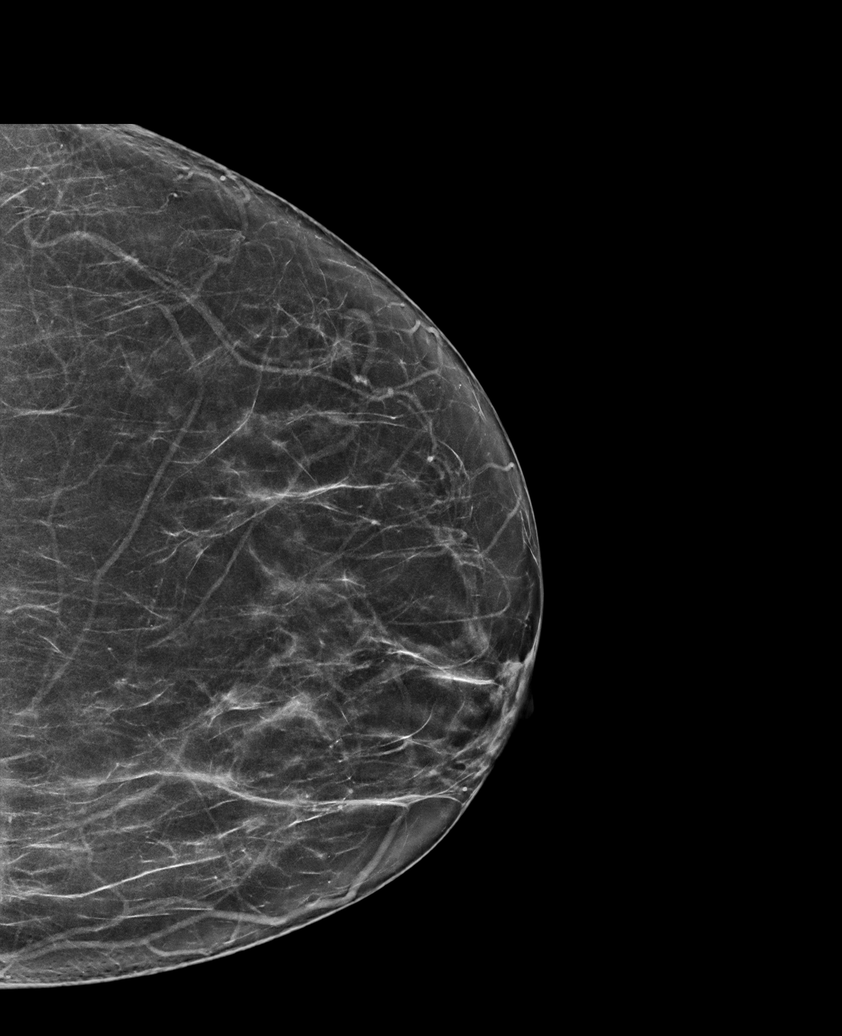

[R CC synth-2D]
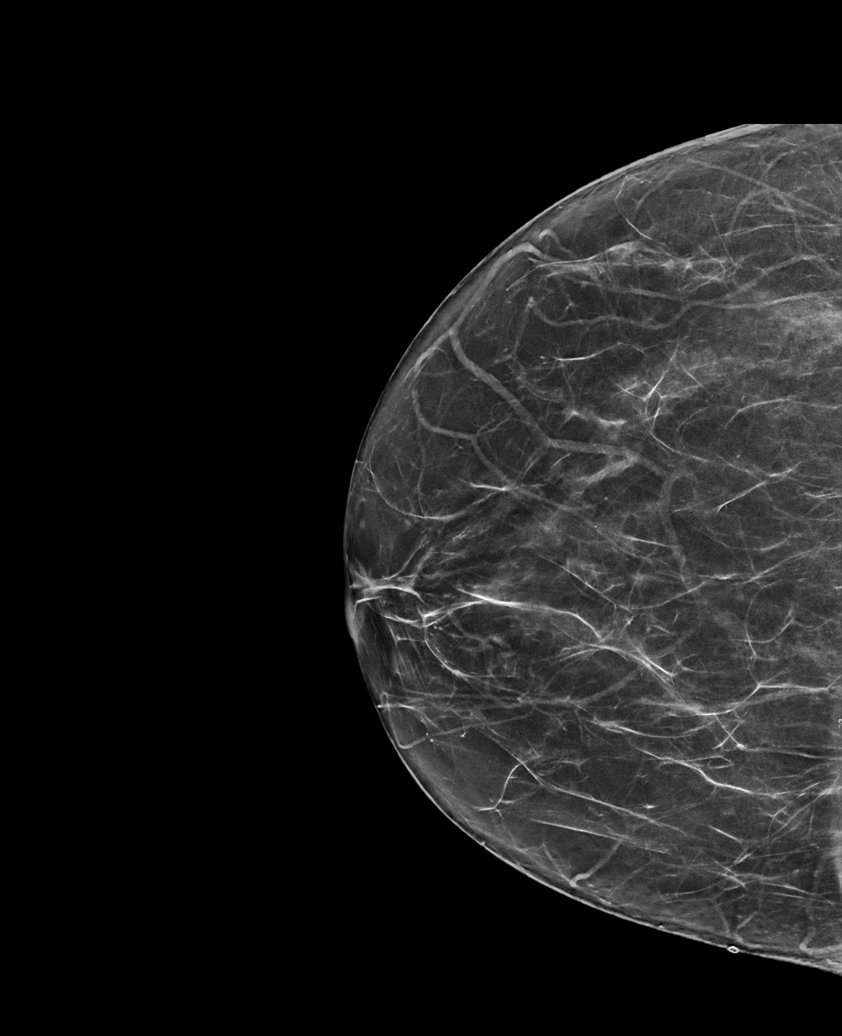

[L MLO synth-2D (2 of 2)]
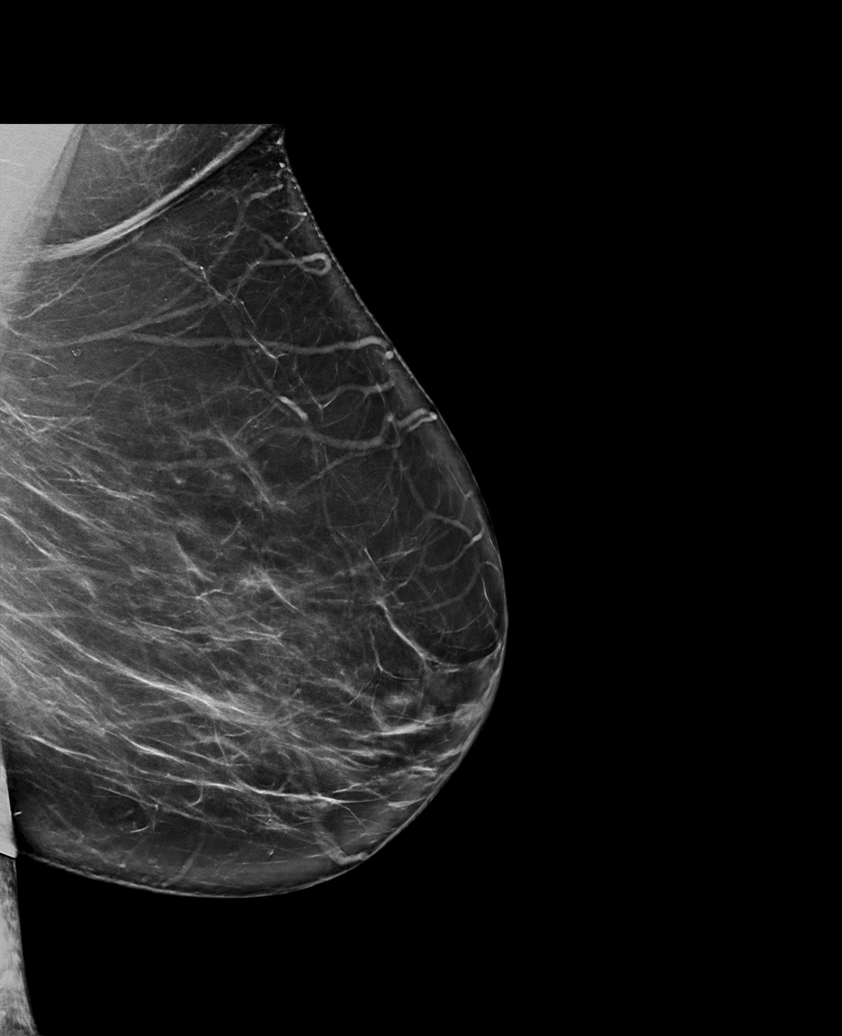

[L CC tomo · tomo slice 38/75.0]
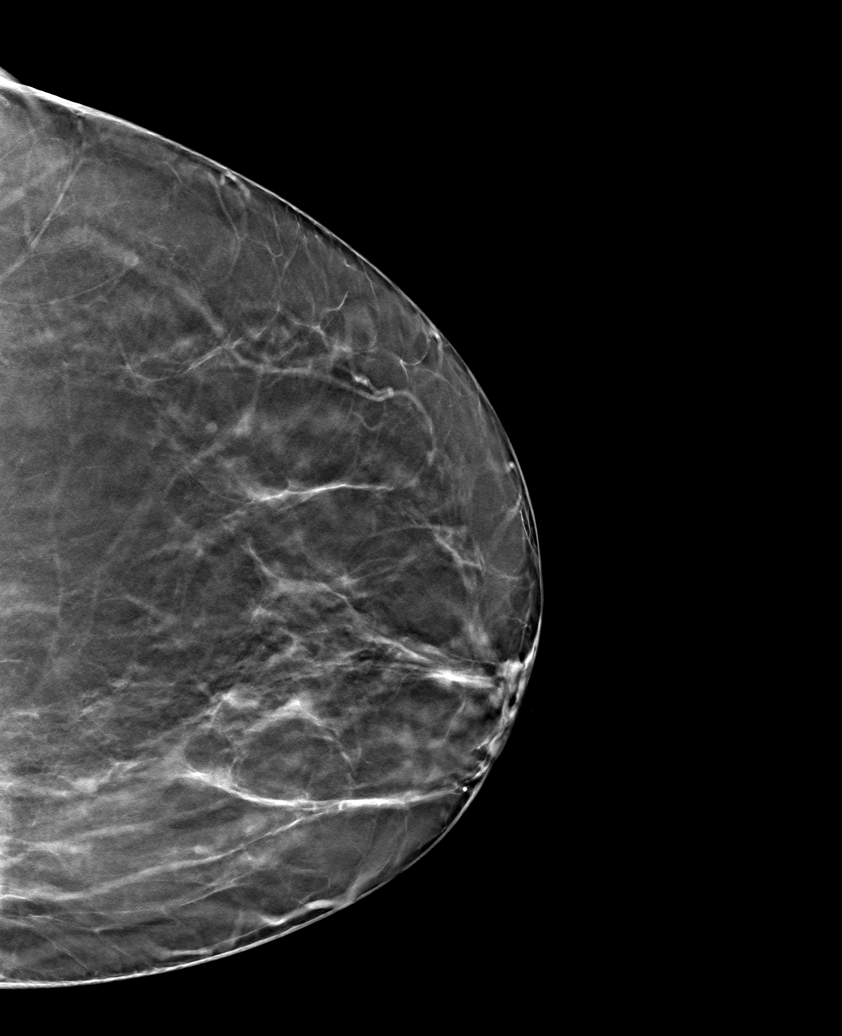

[6 of 30 positions shown; findings below may reference images not displayed]

ACR Breast Density Category b: There are scattered areas of
fibroglandular density.
FINDINGS: There are no findings suspicious for malignancy.
IMPRESSION: No mammographic evidence of malignancy. A result letter of this
screening mammogram will be mailed directly to the patient.

RECOMMENDATION:
Screening mammogram in one year. (Code:51-O-LD2)

BI-RADS CATEGORY  1: Negative.

## 2022-03-12 DIAGNOSIS — I10 Essential (primary) hypertension: Secondary | ICD-10-CM | POA: Diagnosis not present

## 2022-03-12 DIAGNOSIS — H52229 Regular astigmatism, unspecified eye: Secondary | ICD-10-CM | POA: Diagnosis not present

## 2022-03-19 DIAGNOSIS — I10 Essential (primary) hypertension: Secondary | ICD-10-CM | POA: Diagnosis not present

## 2022-03-19 DIAGNOSIS — E782 Mixed hyperlipidemia: Secondary | ICD-10-CM | POA: Diagnosis not present

## 2022-03-19 DIAGNOSIS — G8929 Other chronic pain: Secondary | ICD-10-CM | POA: Diagnosis not present

## 2022-03-19 DIAGNOSIS — K219 Gastro-esophageal reflux disease without esophagitis: Secondary | ICD-10-CM | POA: Diagnosis not present

## 2022-03-19 DIAGNOSIS — J4520 Mild intermittent asthma, uncomplicated: Secondary | ICD-10-CM | POA: Diagnosis not present

## 2022-04-16 DIAGNOSIS — E7849 Other hyperlipidemia: Secondary | ICD-10-CM

## 2022-04-23 ENCOUNTER — Encounter: Payer: Self-pay | Admitting: Internal Medicine

## 2022-04-23 ENCOUNTER — Ambulatory Visit (INDEPENDENT_AMBULATORY_CARE_PROVIDER_SITE_OTHER): Payer: Medicare HMO | Admitting: Internal Medicine

## 2022-04-23 VITALS — BP 144/81 | HR 61 | Ht 64.05 in | Wt 258.0 lb

## 2022-04-23 DIAGNOSIS — I2510 Atherosclerotic heart disease of native coronary artery without angina pectoris: Secondary | ICD-10-CM | POA: Diagnosis not present

## 2022-04-23 DIAGNOSIS — E7849 Other hyperlipidemia: Secondary | ICD-10-CM | POA: Diagnosis not present

## 2022-04-23 DIAGNOSIS — I739 Peripheral vascular disease, unspecified: Secondary | ICD-10-CM

## 2022-04-23 DIAGNOSIS — I2584 Coronary atherosclerosis due to calcified coronary lesion: Secondary | ICD-10-CM | POA: Diagnosis not present

## 2022-04-23 NOTE — Patient Instructions (Signed)
Medication Instructions:  NO CHANGES today   *If you need a refill on your cardiac medications before your next appointment, please call your pharmacy*   Lab Work: FASTING lab work today   [facility] lab work in 3-4 months  If you have labs (blood work) drawn today and your tests are completely normal, you will receive your results only by: [employer] (if you have MyChart) OR A paper copy in the mail If you have any lab test that is abnormal or we need to change your treatment, we will call you to review the results.   Testing/Procedures: ABI - leg doppler at Dr. Lysbeth Penner office   Follow-Up: At [facility], you and your health needs are our priority.  As part of our continuing mission to provide you with exceptional heart care, we have created designated Provider Care Teams.  These Care Teams include your primary Cardiologist (physician) and Advanced Practice Providers (APPs -  Physician Assistants and Nurse Practitioners) who all work together to provide you with the care you need, when you need it.  We recommend signing up for the patient portal called "MyChart".  Sign up information is provided on this After Visit Summary.  MyChart is used to connect with patients for Virtual Visits (Telemedicine).  Patients are able to view lab/test results, encounter notes, upcoming appointments, etc.  Non-urgent messages can be sent to your provider as well.   To learn more about what you can do with MyChart, go to [email or web address].    Your next appointment:   3-4 months with Dr. Debara Pickett -- OK to use DOD - [facility] or [facility]

## 2022-04-23 NOTE — Progress Notes (Signed)
LIPID CLINIC CONSULT NOTE  Chief Complaint:  Follow-up dyslipidemia  Primary Care Physician: Antony Contras, MD  Primary Cardiologist:  Berniece Salines, DO  HPI:  Paula [name] is a 61 y.o. female who is being seen today for the evaluation of dyslipidemia at the request of Antony Contras, MD.  This is a pleasant 61 year old female kindly referred for evaluation management of dyslipidemia by Dr. Harriet Masson.  She has a history of high cholesterol in the past and unfortunately has been intolerant to statins causing significant myalgias.  She also has a history of obesity, chronic pain and fibromyalgia, family history of heart disease and other medication intolerances.  Recently she underwent CT coronary angiography which demonstrated mild nonobstructive coronary disease with a calcium score of 11, 88th percentile for age and sex matched controls.  She was informed of those results today.  She did have recent lipids however showing a significantly elevated cholesterol with total of 384, triglycerides 136, HDL 53 and LDL 304.  She reports diet that seems to have a significant amount of saturated fats but does not eat fried food regularly however does use a lot of sources of saturated fat and would benefit from dietary modification.  That being said given her severely elevated cholesterol, its likely she has familial hyperlipidemia.  She also reported some palpitations today and an EKG was performed.  This was personally reviewed and indicates sinus rhythm, nonspecific T wave changes at 62.  04/23/2022  Paula [name] returns today for follow-up.  She reports compliance with the Repatha although unfortunately did not get her lipids rechecked.  Her LDL was quite high at 304 and we did send her for genetic testing.  This did show 3 variants of unknown significance including mutations in APO A5, LPL and LP(a).  It is expected that her LP(a) is quite high however the Repatha should reduce that by about 20 to 30%.   Unfortunately she could not tolerate statins.  Ultimately she may need additional therapy.  She has also been describing leg pain when she walks.  Is not always associated with walking a certain distance in fact she can have some pain with sitting in certain positions.  She notes also she gets discomfort that in her legs and buttocks when she has bowel movements.  This is concerning for possible lumbosacral disease or perhaps related to spinal cord impingement.  She denies any specific numbness or anesthesia.  She does see orthopedics and I encouraged her to follow-up with them.  My suspicion for arterial insufficiency is low.  Have coronary CT angiography in January which showed a calcium score of 96, 88th percentile for sex and matched control, again suggesting early onset heart disease however only minimal nonobstructive coronary disease was noted.  PMHx:  Past Medical History:  Diagnosis Date   Anxiety    pt stated anxiety episodes resemble stroke symptoms   Chronic lower back pain    Conversion disorder    Depression    Diverticulitis    Expressive aphasia 11/15/2016   Fibromyalgia    GERD (gastroesophageal reflux disease)    Heart murmur    High cholesterol    History of gastric ulcer    History of hiatal hernia    History of kidney stones    History of thyroid nodule    Hyperlipidemia    Hypertension    IBS (irritable bowel syndrome)    Intermittent vertigo    Migraine    "a few times/year now; maybe" (  11/15/2016)   Obesity    OSA on CPAP    wears CPAP nightly ;study in epic 02-01-2014 (11/15/2016)   Osteoarthritis    "knees, hands, back, hips" (11/15/2016)   Pneumonia X 1   hx of   Psychogenic tremor    intermittant head tremor-(11/15/2016)   Refusal of blood transfusions as patient is Jehovah's Witness    RLS (restless legs syndrome)    Sleep apnea    CPAP   Urinary incontinence    Vertigo     Past Surgical History:  Procedure Laterality Date   ABDOMINAL  HYSTERECTOMY  1990   Partial, still has ovaries   ANTERIOR CERVICAL DECOMP/DISCECTOMY FUSION  02/10/2004   C4 -- C7   BACK SURGERY     CARDIOVASCULAR STRESS TEST  2017   Negative   CESAREAN SECTION  1977; 1982   COLONOSCOPY     CYSTOSCOPY W/ RETROGRADES Right 06/07/2015   Procedure: CYSTOSCOPY WITH RETROGRADE PYELOGRAM;  Surgeon: Ardis Hughs, MD;  Location: [facility];  Service: Urology;  Laterality: Right;   CYSTOSCOPY WITH URETEROSCOPY AND STENT PLACEMENT Right 06/07/2015   Procedure: RIGHT URETEROSCOPY, LASER LITHOTRIPSY, STONE REMOVAL AND RIGHT URETERAL STENT PLACEMENT;  Surgeon: Ardis Hughs, MD;  Location: [facility];  Service: Urology;  Laterality: Right;   DIRECT LARYNGOSCOPY N/A 10/29/2021   Procedure: DIRECT LARYNGOSCOPY WITH BIOPSY;  Surgeon: Melida Quitter, MD;  Location: [facility];  Service: ENT;  Laterality: N/A;   HERNIA REPAIR     HOLMIUM LASER APPLICATION N/A 16/09/9603   Procedure: HOLMIUM LASER APPLICATION;  Surgeon: Ardis Hughs, MD;  Location: [facility];  Service: Urology;  Laterality: N/A;   LAPAROSCOPIC CHOLECYSTECTOMY  10/07/2000   LIPOMA EXCISION  01/2017   from neck   UMBILICAL HERNIA REPAIR  age 38   UPPER GI ENDOSCOPY      FAMHx:  Family History  Problem Relation Age of Onset   Hyperlipidemia Mother    Hypertension Mother    Sleep apnea Mother    Osteoporosis Mother    Hypertension Father    Heart disease Father    Syncope episode Father    Colon cancer Father    Atrial fibrillation Father    Hypertension Sister    Sleep apnea Sister    Sleep apnea Brother    Hypertension Brother    Colon cancer Maternal Aunt    Lung cancer Maternal Aunt    Hypertension Maternal Grandmother    CVA Maternal Grandmother    Pancreatic cancer Maternal Grandfather    Colon cancer Maternal Grandfather    Hypertension Maternal Grandfather    Lung cancer Maternal Grandfather    Throat cancer Maternal  Grandfather    Colon cancer Paternal Grandfather    Esophageal cancer Neg Hx    Liver cancer Neg Hx    Stomach cancer Neg Hx     SOCHx:   reports that she quit smoking about 13 years ago. Her smoking use included cigarettes. She has a 12.50 pack-year smoking history. She has never used smokeless tobacco. She reports that she does not currently use alcohol. She reports that she does not use drugs.  ALLERGIES:  Allergies  Allergen Reactions   Detrol [Tolterodine] Other (See Comments)    slurred speech, tremors, dizziness   Gabapentin Palpitations and Other (See Comments)    Wt gain, tremor   Aspirin Other (See Comments)    Avoids--- history of gastric ulcers    Contrast Media [Iodinated  Contrast Media] Hives, Itching and Rash    CT contrast-Needed to take benadryl    Imitrex [Sumatriptan] Other (See Comments)    Body hurts   Oxycodone Nausea And Vomiting   Milk-Related Compounds Other (See Comments)   Rosuvastatin Other (See Comments)   Vicodin Hp [Hydrocodone-Acetaminophen] Nausea And Vomiting   Avelox [Moxifloxacin] Itching    ROS: Pertinent items noted in HPI and remainder of comprehensive ROS otherwise negative.  HOME MEDS: Current Outpatient Medications on File Prior to Visit  Medication Sig Dispense Refill   acetaminophen (TYLENOL) 500 MG tablet Take 1,000 mg by mouth every 6 (six) hours as needed for headache (pain).     albuterol (VENTOLIN HFA) 108 (90 Base) MCG/ACT inhaler 2 puffs every 6 (six) hours as needed for shortness of breath or wheezing.     AMBULATORY NON FORMULARY MEDICATION Medication Name: GI Cocktail  53m viscous lidocaine 937m'10mg'$ /27m227micyclomine 270m81malox 450ml67mal  Take 5-10 ml every 4 to 6 hours as needed 450 mL 0   Cholecalciferol (VITAMIN D3 PO) Take 1 tablet by mouth at bedtime.     Cyanocobalamin (VITAMIN B 12) 500 MCG TABS 1 tablet     cyclobenzaprine (FLEXERIL) 10 MG tablet Take 1 tablet by mouth at bedtime as needed for muscle  spasms.     dexlansoprazole (DEXILANT) 60 MG capsule Take 60 mg by mouth at bedtime.     dicyclomine (BENTYL) 20 MG tablet Take 20 mg by mouth at bedtime.     DILT-XR 120 MG 24 hr capsule Take by mouth.     ELDERBERRY PO Take 2 tablets by mouth at bedtime.     escitalopram (LEXAPRO) 10 MG tablet daily.     Evolocumab (REPATHA SURECLICK) 140 M962L SOAJ Inject 1 Dose into the skin every 14 (fourteen) days. (Patient taking differently: Inject 140 mg into the skin every 14 (fourteen) days. Every other Tuesday) 2 mL 11   famotidine (PEPCID) 20 MG tablet TAKE 1 TABLET AT BEDTIME (Patient taking differently: Take 20 mg by mouth at bedtime.) 90 tablet 1   omeprazole (PRILOSEC) 10 MG capsule Take 10 mg by mouth daily.     ranolazine (RANEXA) 500 MG 12 hr tablet Take 1 tablet (500 mg total) by mouth 2 (two) times daily. (Patient taking differently: Take 500 mg by mouth daily.) 180 tablet 3   Current Facility-Administered Medications on File Prior to Visit  Medication Dose Route Frequency Provider Last Rate Last Admin   0.9 %  sodium chloride infusion  500 mL Intravenous Once PerryIrene Shipper       LABS/IMAGING: No results found for this or any previous visit (from the past 48 hour(s)). No results found.  LIPID PANEL:    Component Value Date/Time   CHOL 281 (H) 07/01/2020 0206   TRIG 175 (H) 07/01/2020 0206   HDL 44 07/01/2020 0206   CHOLHDL 6.4 07/01/2020 0206   VLDL 35 07/01/2020 0206   LDLCALC 202 (H) 07/01/2020 0206   LDLDIRECT 170.1 01/08/2010 0943    WEIGHTS: Wt Readings from Last 3 Encounters:  04/23/22 258 lb (117 kg)  02/13/22 255 lb (115.7 kg)  02/02/22 252 lb (114.3 kg)    VITALS: BP (!) 144/81   Pulse 61   Ht 5' 4.05" (1.627 m)   Wt 258 lb (117 kg)   SpO2 97%   BMI 44.22 kg/m   EXAM: Deferred  EKG: Deferred  ASSESSMENT: Genetically confirmed familial hyperlipidemia, LDL greater than 300, notable mutations  in APO A5, LPL and LP(a) Premature coronary disease  with a calcium score 40, 88th percentile for age and sex matched controls, minimal nonobstructive disease by CTA (12/2021) Family history of coronary disease Atherogenic diet Morbid obesity Statin intolerance-myalgias Leg pain with walking/lumbosacral pain, worse with defecation  PLAN: 1.   Ms. Disla has a compound heterozygous familial hyperlipidemia with LDL greater than 300.  She has been compliant with Repatha but has not had repeat labs.  I am hopeful for a reasonable improvement in her lipids.  Unfortunately her LP(a) gene is mutated which could indicate a high LP(a).  That will be reassessed along with a lipid NMR.  She was noted to have age advanced coronary disease but fortunately no obstructive coronary disease by CTA.  She needs continued aggressive medical therapy.  She may likely need additional treatment.  We will contact her with the results of her lipids and further management recommendations.  Finally with regards to leg pain and lumbosacral pain, we will go ahead and order lower extremity ABIs however my suspicion is that this is more likely related to her spine and I encouraged her to follow-up with her orthopedist.  Pixie Casino, MD, [facility], [place] Director of the [place] of the American Board of Clinical Lipidology Attending Cardiologist  Direct Dial: [phone]  Fax: [phone]  Website:  [email or web address] 04/23/2022, 9:38 AM

## 2022-04-24 LAB — NMR, LIPOPROFILE
Cholesterol, Total: 175 mg/dL (ref 100–199)
HDL Particle Number: 37 umol/L (ref >=30.5)
HDL-C: 46 mg/dL (ref >39)
LDL Particle Number: 1344 nmol/L — ABNORMAL HIGH (ref <1000)
LDL Size: 20.7 nm (ref >20.5)
LDL-C (NIH Calc): 94 mg/dL (ref 0–99)
LP-IR Score: 61 — ABNORMAL HIGH (ref <=45)
Small LDL Particle Number: 813 nmol/L — ABNORMAL HIGH (ref <=527)
Triglycerides: 207 mg/dL — ABNORMAL HIGH (ref 0–149)

## 2022-04-24 LAB — LIPOPROTEIN A (LPA): Lipoprotein (a): 56.8 nmol/L (ref <75.0)

## 2022-04-26 DIAGNOSIS — K588 Other irritable bowel syndrome: Secondary | ICD-10-CM | POA: Diagnosis not present

## 2022-04-26 DIAGNOSIS — R251 Tremor, unspecified: Secondary | ICD-10-CM | POA: Diagnosis not present

## 2022-04-26 DIAGNOSIS — I10 Essential (primary) hypertension: Secondary | ICD-10-CM | POA: Diagnosis not present

## 2022-04-26 DIAGNOSIS — F411 Generalized anxiety disorder: Secondary | ICD-10-CM | POA: Diagnosis not present

## 2022-04-26 DIAGNOSIS — M5030 Other cervical disc degeneration, unspecified cervical region: Secondary | ICD-10-CM | POA: Diagnosis not present

## 2022-04-26 DIAGNOSIS — E782 Mixed hyperlipidemia: Secondary | ICD-10-CM | POA: Diagnosis not present

## 2022-04-26 DIAGNOSIS — K219 Gastro-esophageal reflux disease without esophagitis: Secondary | ICD-10-CM | POA: Diagnosis not present

## 2022-04-26 DIAGNOSIS — J4520 Mild intermittent asthma, uncomplicated: Secondary | ICD-10-CM | POA: Diagnosis not present

## 2022-04-26 DIAGNOSIS — R7303 Prediabetes: Secondary | ICD-10-CM | POA: Diagnosis not present

## 2022-05-01 ENCOUNTER — Telehealth: Payer: Self-pay | Admitting: Internal Medicine

## 2022-05-01 NOTE — Telephone Encounter (Signed)
PA for repatha submitted via CMM (Key: [facility]) PA Case: [id number], Status: Approved, Coverage Starts on: 12/02/2021 12:00:00 AM, Coverage Ends on: 12/01/2022 12:00:00 AM

## 2022-05-02 DIAGNOSIS — G4733 Obstructive sleep apnea (adult) (pediatric): Secondary | ICD-10-CM | POA: Diagnosis not present

## 2022-05-07 DIAGNOSIS — N3946 Mixed incontinence: Secondary | ICD-10-CM | POA: Diagnosis not present

## 2022-05-07 DIAGNOSIS — Z4689 Encounter for fitting and adjustment of other specified devices: Secondary | ICD-10-CM | POA: Diagnosis not present

## 2022-05-08 MED ORDER — EZETIMIBE 10 MG PO TABS: 10.0000 mg | ORAL_TABLET | Freq: Every day | ORAL | 3 refills | Status: DC

## 2022-05-13 ENCOUNTER — Other Ambulatory Visit: Payer: Self-pay | Admitting: Internal Medicine

## 2022-05-13 DIAGNOSIS — I739 Peripheral vascular disease, unspecified: Secondary | ICD-10-CM

## 2022-05-14 DIAGNOSIS — R1313 Dysphagia, pharyngeal phase: Secondary | ICD-10-CM | POA: Diagnosis not present

## 2022-05-14 DIAGNOSIS — J392 Other diseases of pharynx: Secondary | ICD-10-CM | POA: Diagnosis not present

## 2022-05-15 ENCOUNTER — Ambulatory Visit (HOSPITAL_COMMUNITY)
Admission: RE | Admit: 2022-05-15 | Discharge: 2022-05-15 | Disposition: A | Discharge status: Home / Self Care | Payer: Medicare HMO | Source: Ambulatory Visit | Attending: Cardiovascular Disease | Admitting: Cardiovascular Disease

## 2022-05-15 DIAGNOSIS — I739 Peripheral vascular disease, unspecified: Secondary | ICD-10-CM

## 2022-06-03 DIAGNOSIS — R61 Generalized hyperhidrosis: Secondary | ICD-10-CM | POA: Diagnosis not present

## 2022-06-03 DIAGNOSIS — I10 Essential (primary) hypertension: Secondary | ICD-10-CM | POA: Diagnosis not present

## 2022-06-03 DIAGNOSIS — M5481 Occipital neuralgia: Secondary | ICD-10-CM | POA: Diagnosis not present

## 2022-06-03 DIAGNOSIS — M62838 Other muscle spasm: Secondary | ICD-10-CM | POA: Diagnosis not present

## 2022-06-08 ENCOUNTER — Ambulatory Visit
Admission: EM | Admit: 2022-06-08 | Discharge: 2022-06-08 | Disposition: A | Discharge status: Home / Self Care | Payer: Medicare HMO | Attending: Neonatology | Admitting: Neonatology

## 2022-06-08 ENCOUNTER — Ambulatory Visit (INDEPENDENT_AMBULATORY_CARE_PROVIDER_SITE_OTHER): Payer: Medicare HMO

## 2022-06-08 ENCOUNTER — Other Ambulatory Visit: Payer: Self-pay

## 2022-06-08 ENCOUNTER — Encounter: Payer: Self-pay | Admitting: Emergency Medicine

## 2022-06-08 DIAGNOSIS — R059 Cough, unspecified: Secondary | ICD-10-CM | POA: Diagnosis not present

## 2022-06-08 DIAGNOSIS — R002 Palpitations: Secondary | ICD-10-CM | POA: Insufficient documentation

## 2022-06-08 DIAGNOSIS — J029 Acute pharyngitis, unspecified: Secondary | ICD-10-CM | POA: Diagnosis not present

## 2022-06-08 DIAGNOSIS — J069 Acute upper respiratory infection, unspecified: Secondary | ICD-10-CM | POA: Diagnosis not present

## 2022-06-08 DIAGNOSIS — J209 Acute bronchitis, unspecified: Secondary | ICD-10-CM | POA: Insufficient documentation

## 2022-06-08 DIAGNOSIS — Z1152 Encounter for screening for COVID-19: Secondary | ICD-10-CM | POA: Insufficient documentation

## 2022-06-08 DIAGNOSIS — R0602 Shortness of breath: Secondary | ICD-10-CM

## 2022-06-08 LAB — POCT RAPID STREP A (OFFICE): Rapid Strep A Screen: NEGATIVE

## 2022-06-08 MED ORDER — BENZONATATE 100 MG PO CAPS: 100.0000 mg | ORAL_CAPSULE | Freq: Three times a day (TID) | ORAL | 0 refills | Status: AC

## 2022-06-08 MED ORDER — AZITHROMYCIN 250 MG PO TABS: 250.0000 mg | ORAL_TABLET | Freq: Every day | ORAL | 0 refills | Status: DC

## 2022-06-08 NOTE — ED Triage Notes (Signed)
Pt sts sore throat x 3 days with chills and fever

## 2022-06-08 NOTE — ED Provider Notes (Signed)
[facility]   [id number] 06/08/22 Arrival Time: [place]  Chief Complaint  Patient presents with   Sore Throat     SUBJECTIVE: History from: patient and family.  Paula [name] is a 61 y.o. female who who presented to the urgent care for complaint of chills, fever, sore throat and nasal congestion for the past 3 days.  Denies sick exposure to COVID, flu or strep.  Denies recent travel.  Has not tried any OTC medication.  Symptoms are made worse with swallowing.  Report previous COVID infection in the past.   Denies fever, chills, fatigue, sinus pain, rhinorrhea, sore throat, SOB, wheezing, chest pain, nausea, changes in bowel or bladder habits.     ROS: As per HPI.  All other pertinent ROS negative.      Past Medical History:  Diagnosis Date   Anxiety    pt stated anxiety episodes resemble stroke symptoms   Chronic lower back pain    Conversion disorder    Depression    Diverticulitis    Expressive aphasia 11/15/2016   Fibromyalgia    GERD (gastroesophageal reflux disease)    Heart murmur    High cholesterol    History of gastric ulcer    History of hiatal hernia    History of kidney stones    History of thyroid nodule    Hyperlipidemia    Hypertension    IBS (irritable bowel syndrome)    Intermittent vertigo    Migraine    "a few times/year now; maybe" (11/15/2016)   Obesity    OSA on CPAP    wears CPAP nightly ;study in epic 02-01-2014 (11/15/2016)   Osteoarthritis    "knees, hands, back, hips" (11/15/2016)   Pneumonia X 1   hx of   Psychogenic tremor    intermittant head tremor-(11/15/2016)   Refusal of blood transfusions as patient is Jehovah's Witness    RLS (restless legs syndrome)    Sleep apnea    CPAP   Urinary incontinence    Vertigo    Past Surgical History:  Procedure Laterality Date   ABDOMINAL HYSTERECTOMY  1990   Partial, still has ovaries   ANTERIOR CERVICAL DECOMP/DISCECTOMY FUSION  02/10/2004   C4 -- C7   BACK SURGERY      CARDIOVASCULAR STRESS TEST  2017   Negative   CESAREAN SECTION  1977; 1982   COLONOSCOPY     CYSTOSCOPY W/ RETROGRADES Right 06/07/2015   Procedure: CYSTOSCOPY WITH RETROGRADE PYELOGRAM;  Surgeon: Ardis Hughs, MD;  Location: [facility];  Service: Urology;  Laterality: Right;   CYSTOSCOPY WITH URETEROSCOPY AND STENT PLACEMENT Right 06/07/2015   Procedure: RIGHT URETEROSCOPY, LASER LITHOTRIPSY, STONE REMOVAL AND RIGHT URETERAL STENT PLACEMENT;  Surgeon: Ardis Hughs, MD;  Location: [facility];  Service: Urology;  Laterality: Right;   DIRECT LARYNGOSCOPY N/A 10/29/2021   Procedure: DIRECT LARYNGOSCOPY WITH BIOPSY;  Surgeon: Melida Quitter, MD;  Location: [place];  Service: ENT;  Laterality: N/A;   HERNIA REPAIR     HOLMIUM LASER APPLICATION N/A 35/36/1443   Procedure: HOLMIUM LASER APPLICATION;  Surgeon: Ardis Hughs, MD;  [facility];  Service: Urology;  Laterality: N/A;   LAPAROSCOPIC CHOLECYSTECTOMY  10/07/2000   LIPOMA EXCISION  01/2017   from neck   UMBILICAL HERNIA REPAIR  age 9   UPPER GI ENDOSCOPY     Allergies  Allergen Reactions   Detrol [Tolterodine] Other (See Comments)    slurred speech,  tremors, dizziness   Gabapentin Palpitations and Other (See Comments)    Wt gain, tremor   Aspirin Other (See Comments)    Avoids--- history of gastric ulcers    Contrast Media [Iodinated Contrast Media] Hives, Itching and Rash    CT contrast-Needed to take benadryl    Imitrex [Sumatriptan] Other (See Comments)    Body hurts   Oxycodone Nausea And Vomiting   Milk-Related Compounds Other (See Comments)   Rosuvastatin Other (See Comments)   Vicodin Hp [Hydrocodone-Acetaminophen] Nausea And Vomiting   Avelox [Moxifloxacin] Itching   Current Facility-Administered Medications on File Prior to Encounter  Medication Dose Route Frequency Provider Last Rate Last Admin   0.9 %  sodium chloride infusion  500 mL Intravenous  Once Irene Shipper, MD       Current Outpatient Medications on File Prior to Encounter  Medication Sig Dispense Refill   acetaminophen (TYLENOL) 500 MG tablet Take 1,000 mg by mouth every 6 (six) hours as needed for headache (pain).     albuterol (VENTOLIN HFA) 108 (90 Base) MCG/ACT inhaler 2 puffs every 6 (six) hours as needed for shortness of breath or wheezing.     AMBULATORY NON FORMULARY MEDICATION Medication Name: GI Cocktail  88m viscous lidocaine 943m'10mg'$ /43m76micyclomine 270m58malox 450ml4mal  Take 5-10 ml every 4 to 6 hours as needed 450 mL 0   Cholecalciferol (VITAMIN D3 PO) Take 1 tablet by mouth at bedtime.     Cyanocobalamin (VITAMIN B 12) 500 MCG TABS 1 tablet     cyclobenzaprine (FLEXERIL) 10 MG tablet Take 1 tablet by mouth at bedtime as needed for muscle spasms.     dexlansoprazole (DEXILANT) 60 MG capsule Take 60 mg by mouth at bedtime.     dicyclomine (BENTYL) 20 MG tablet Take 20 mg by mouth at bedtime.     DILT-XR 120 MG 24 hr capsule Take by mouth.     ELDERBERRY PO Take 2 tablets by mouth at bedtime.     escitalopram (LEXAPRO) 10 MG tablet daily.     Evolocumab (REPATHA SURECLICK) 140 M626L SOAJ Inject 1 Dose into the skin every 14 (fourteen) days. (Patient taking differently: Inject 140 mg into the skin every 14 (fourteen) days. Every other Tuesday) 2 mL 11   ezetimibe (ZETIA) 10 MG tablet Take 1 tablet (10 mg total) by mouth daily. 90 tablet 3   famotidine (PEPCID) 20 MG tablet TAKE 1 TABLET AT BEDTIME (Patient taking differently: Take 20 mg by mouth at bedtime.) 90 tablet 1   omeprazole (PRILOSEC) 10 MG capsule Take 10 mg by mouth daily.     ranolazine (RANEXA) 500 MG 12 hr tablet Take 1 tablet (500 mg total) by mouth 2 (two) times daily. (Patient taking differently: Take 500 mg by mouth daily.) 180 tablet 3   Social History   Socioeconomic History   Marital status: Married    Spouse name: CharlJuanda Crumblember of children: 2   Years of education: 11    Highest education level: Not on file  Occupational History   Occupation: disabled  Tobacco Use   Smoking status: Former    Packs/day: 0.50    Years: 25.00    Total pack years: 12.50    Types: Cigarettes    Quit date: 07/24/2008    Years since quitting: 13.8   Smokeless tobacco: Never  Vaping Use   Vaping Use: Never used  Substance and Sexual Activity   Alcohol use: Not Currently  Comment: 5 drinks per year or less   Drug use: No   Sexual activity: Yes  Other Topics Concern   Not on file  Social History Narrative   Married, lives at home      Caffeine use - coffee daily (decaf), tea once a week      Jehovah's Witness - no blood products   Right handed   Social Determinants of Health   Financial Resource Strain: Not on file  Food Insecurity: Not on file  Transportation Needs: Not on file  Physical Activity: Not on file  Stress: Not on file  Social Connections: Not on file  Intimate Partner Violence: Not on file   Family History  Problem Relation Age of Onset   Hyperlipidemia Mother    Hypertension Mother    Sleep apnea Mother    Osteoporosis Mother    Hypertension Father    Heart disease Father    Syncope episode Father    Colon cancer Father    Atrial fibrillation Father    Hypertension Sister    Sleep apnea Sister    Sleep apnea Brother    Hypertension Brother    Colon cancer Maternal Aunt    Lung cancer Maternal Aunt    Hypertension Maternal Grandmother    CVA Maternal Grandmother    Pancreatic cancer Maternal Grandfather    Colon cancer Maternal Grandfather    Hypertension Maternal Grandfather    Lung cancer Maternal Grandfather    Throat cancer Maternal Grandfather    Colon cancer Paternal Grandfather    Esophageal cancer Neg Hx    Liver cancer Neg Hx    Stomach cancer Neg Hx     OBJECTIVE:  Vitals:   06/08/22 1130  BP: (!) 149/81  Pulse: 78  Resp: 18  Temp: 98.7 F (37.1 C)  TempSrc: Oral  SpO2: 95%     General appearance: alert;  appears fatigued, but nontoxic; speaking in full sentences and tolerating own secretions HEENT: NCAT; Ears: EACs clear, TMs pearly gray; Eyes: PERRL.  EOM grossly intact. Sinuses: nontender; Nose: nares patent without rhinorrhea, Throat: oropharynx clear, tonsils non erythematous or enlarged, uvula midline  Neck: supple without LAD Lungs: unlabored respirations, symmetrical air entry; cough: moderate; no respiratory distress; CTAB Heart: regular rate and rhythm.  Radial pulses 2+ symmetrical bilaterally Skin: warm and dry Psychological: alert and cooperative; normal mood and affect  LABS:  Results for orders placed or performed during the hospital encounter of 06/08/22 (from the past 24 hour(s))  POCT rapid strep A     Status: None   Collection Time: 06/08/22 11:37 AM  Result Value Ref Range   Rapid Strep A Screen Negative Negative     ASSESSMENT & PLAN:  1. Encounter for screening for COVID-19   2. Sore throat   3. Palpitations   4. URI with cough and congestion   5. Acute bronchitis, unspecified organism     Meds ordered this encounter  Medications   benzonatate (TESSALON) 100 MG capsule    Sig: Take 1 capsule (100 mg total) by mouth every 8 (eight) hours.    Dispense:  21 capsule    Refill:  0   azithromycin (ZITHROMAX) 250 MG tablet    Sig: Take 1 tablet (250 mg total) by mouth daily. Take first 2 tablets together, then 1 every day until finished.    Dispense:  6 tablet    Refill:  0   Discharge instructions  COVID testing ordered.  It will take  between 1-2 days for test results.  Someone will contact you regarding abnormal results.    Get plenty of rest and push fluids Tessalon Perles prescribed for cough Azithromycin was prescribed for bronchitis Use OTC zyrtec for nasal congestion, runny nose, and/or sore throat Use OTC flonase for nasal congestion and runny nose Use medications daily for symptom relief Use OTC medications like ibuprofen or tylenol as needed  fever or pain Call or go to the ED if you have any new or worsening symptoms such as fever, worsening cough, shortness of breath, chest tightness, chest pain, turning blue, changes in mental status, etc...   Reviewed expectations re: course of current medical issues. Questions answered. Outlined signs and symptoms indicating need for more acute intervention. Patient verbalized understanding. After Visit Summary given.          Emerson Monte, FNP 06/08/22 1302

## 2022-06-08 NOTE — Discharge Instructions (Addendum)
COVID testing ordered.  It will take between 1-2 days for test results.  Someone will contact you regarding abnormal results.    Get plenty of rest and push fluids Tessalon Perles prescribed for cough Azithromycin was prescribed for bronchitis Use OTC zyrtec for nasal congestion, runny nose, and/or sore throat Use OTC flonase for nasal congestion and runny nose Use medications daily for symptom relief Use OTC medications like ibuprofen or tylenol as needed fever or pain Call or go to the ED if you have any new or worsening symptoms such as fever, worsening cough, shortness of breath, chest tightness, chest pain, turning blue, changes in mental status, etc..[name]

## 2022-06-09 LAB — NOVEL CORONAVIRUS, NAA: SARS-CoV-2, NAA: NOT DETECTED

## 2022-06-11 ENCOUNTER — Encounter (HOSPITAL_COMMUNITY): Payer: Self-pay

## 2022-06-11 ENCOUNTER — Emergency Department (HOSPITAL_COMMUNITY)
Admission: EM | Admit: 2022-06-11 | Discharge: 2022-06-11 | Disposition: A | Discharge status: Home / Self Care | Payer: Medicare HMO | Attending: Emergency Medicine | Admitting: Emergency Medicine

## 2022-06-11 ENCOUNTER — Emergency Department (HOSPITAL_COMMUNITY): Payer: Medicare HMO

## 2022-06-11 ENCOUNTER — Other Ambulatory Visit: Payer: Self-pay

## 2022-06-11 DIAGNOSIS — J352 Hypertrophy of adenoids: Secondary | ICD-10-CM | POA: Diagnosis not present

## 2022-06-11 DIAGNOSIS — R1310 Dysphagia, unspecified: Secondary | ICD-10-CM | POA: Diagnosis not present

## 2022-06-11 DIAGNOSIS — R739 Hyperglycemia, unspecified: Secondary | ICD-10-CM | POA: Insufficient documentation

## 2022-06-11 DIAGNOSIS — J029 Acute pharyngitis, unspecified: Secondary | ICD-10-CM | POA: Diagnosis not present

## 2022-06-11 DIAGNOSIS — D72829 Elevated white blood cell count, unspecified: Secondary | ICD-10-CM | POA: Diagnosis not present

## 2022-06-11 LAB — CBC WITH DIFFERENTIAL/PLATELET
Abs Immature Granulocytes: 0.12 10*3/uL — ABNORMAL HIGH (ref 0.00–0.07)
Basophils Absolute: 0.1 10*3/uL (ref 0.0–0.1)
Basophils Relative: 0 %
Eosinophils Absolute: 0 10*3/uL (ref 0.0–0.5)
Eosinophils Relative: 0 %
HCT: 38 % (ref 36.0–46.0)
Hemoglobin: 12.1 g/dL (ref 12.0–15.0)
Immature Granulocytes: 1 %
Lymphocytes Relative: 19 %
Lymphs Abs: 3.3 10*3/uL (ref 0.7–4.0)
MCH: 28.9 pg (ref 26.0–34.0)
MCHC: 31.8 g/dL (ref 30.0–36.0)
MCV: 90.9 fL (ref 80.0–100.0)
Monocytes Absolute: 0.7 10*3/uL (ref 0.1–1.0)
Monocytes Relative: 4 %
Neutro Abs: 12.9 10*3/uL — ABNORMAL HIGH (ref 1.7–7.7)
Neutrophils Relative %: 76 %
Platelets: 381 10*3/uL (ref 150–400)
RBC: 4.18 MIL/uL (ref 3.87–5.11)
RDW: 12.2 % (ref 11.5–15.5)
WBC: 17.1 10*3/uL — ABNORMAL HIGH (ref 4.0–10.5)
nRBC: 0 % (ref 0.0–0.2)

## 2022-06-11 LAB — BASIC METABOLIC PANEL
Anion gap: 7 (ref 5–15)
BUN: 15 mg/dL (ref 8–23)
CO2: 26 mmol/L (ref 22–32)
Calcium: 9.6 mg/dL (ref 8.9–10.3)
Chloride: 109 mmol/L (ref 98–111)
Creatinine, Ser: 0.83 mg/dL (ref 0.44–1.00)
GFR, Estimated: >60 mL/min (ref >60)
Glucose, Bld: 120 mg/dL — ABNORMAL HIGH (ref 70–99)
Potassium: 4.4 mmol/L (ref 3.5–5.1)
Sodium: 142 mmol/L (ref 135–145)

## 2022-06-11 LAB — CULTURE, GROUP A STREP (THRC)

## 2022-06-11 MED ORDER — IOHEXOL 300 MG/ML  SOLN
75.0000 mL | Freq: Once | INTRAMUSCULAR | Status: AC | PRN
  Administered 2022-06-11: 75 mL via INTRAVENOUS

## 2022-06-11 MED ORDER — SODIUM CHLORIDE (PF) 0.9 % IJ SOLN
INTRAMUSCULAR | Status: AC
  Filled 2022-06-11: qty 50

## 2022-06-11 MED ORDER — LIDOCAINE VISCOUS HCL 2 % MT SOLN: 15.0000 mL | OROMUCOSAL | 0 refills | Status: DC | PRN

## 2022-06-11 MED ORDER — DIPHENHYDRAMINE HCL 50 MG/ML IJ SOLN
50.0000 mg | Freq: Once | INTRAMUSCULAR | Status: AC
  Administered 2022-06-11: 50 mg via INTRAVENOUS
  Filled 2022-06-11: qty 1

## 2022-06-11 MED ORDER — DIPHENHYDRAMINE HCL 25 MG PO CAPS
50.0000 mg | ORAL_CAPSULE | Freq: Once | ORAL | Status: AC
  Filled 2022-06-11: qty 2

## 2022-06-11 MED ORDER — METHYLPREDNISOLONE SODIUM SUCC 40 MG IJ SOLR
40.0000 mg | Freq: Once | INTRAMUSCULAR | Status: AC
  Administered 2022-06-11: 40 mg via INTRAVENOUS
  Filled 2022-06-11: qty 1

## 2022-06-11 NOTE — ED Provider Notes (Signed)
[facility] DEPT Provider Note   CSN: [id number] Arrival date & time: 06/11/22  0145     History {Add pertinent medical, surgical, social history, OB history to HPI:1} Chief Complaint  Patient presents with   Sore Throat    Paula [name] is a 61 y.o. female.   Sore Throat    Patient with medical history of anxiety, conversion disorder, fibromyalgia, IBS, migraines, psychogenic tremors, OSA presents today due to sore throat.  This has been going on for a week and a half, states she has been seen twice with negative strep, COVID and chest x-ray.  She is having progressively worsening dysphagia, feels like there is swelling to the right side of her neck.  She says she has been having fevers, also having migraines which feel like her typical migraines.  She feels like there is something in her throat making it difficult to breathe, states her voice feels different and she is not speaking as clearly as normal.  She has not been able to sleep.  She has tried over-the-counter home medicine without any relief.  Denies dental pain, denies any dental procedures.  Most recent negative strep test was 2 days ago at [facility].  States she saw her primary on Thursday last week plan unable to see that record.  She has an appointment with ENT on Wednesday this week.  Home Medications Prior to Admission medications   Medication Sig Start Date End Date Taking? Authorizing Provider  acetaminophen (TYLENOL) 500 MG tablet Take 1,000 mg by mouth every 6 (six) hours as needed for headache (pain).    [provider]  albuterol (VENTOLIN HFA) 108 (90 Base) MCG/ACT inhaler 2 puffs every 6 (six) hours as needed for shortness of breath or wheezing. 01/11/21   [provider]  AMBULATORY NON FORMULARY MEDICATION Medication Name: GI Cocktail  11m viscous lidocaine 921m'10mg'$ /61m61micyclomine 270m63malox 450ml28mal  Take 5-10 ml every 4 to 6 hours as needed  02/13/22   LemmoLevin Erp azithromycin (ZITHROMAX) 250 MG tablet Take 1 tablet (250 mg total) by mouth daily. Take first 2 tablets together, then 1 every day until finished. 06/08/22   Avegno, KomlaDarrelyn Hillock  benzonatate (TESSALON) 100 MG capsule Take 1 capsule (100 mg total) by mouth every 8 (eight) hours. 06/08/22   Avegno, KomlaDarrelyn Hillock  Cholecalciferol (VITAMIN D3 PO) Take 1 tablet by mouth at bedtime.    [provider]  Cyanocobalamin (VITAMIN B 12) 500 MCG TABS 1 tablet 03/19/22   [provider]  cyclobenzaprine (FLEXERIL) 10 MG tablet Take 1 tablet by mouth at bedtime as needed for muscle spasms.    [provider]  dexlansoprazole (DEXILANT) 60 MG capsule Take 60 mg by mouth at bedtime. 09/17/21   [provider]  dicyclomine (BENTYL) 20 MG tablet Take 20 mg by mouth at bedtime.    [provider]  DILT-XR 120 MG 24 hr capsule Take by mouth. 03/21/22   [provider]  ELDERBERRY PO Take 2 tablets by mouth at bedtime.    [provider]  escitalopram (LEXAPRO) 10 MG tablet daily. 12/13/21   [provider]  Evolocumab (REPATHA SURECLICK) 140 M630L SOAJ Inject 1 Dose into the skin every 14 (fourteen) days. Patient taking differently: Inject 140 mg into the skin every 14 (fourteen) days. Every other Tuesday 12/24/21   HiltyPixie Casino ezetimibe (ZETIA) 10 MG tablet Take 1 tablet (10 mg total)  by mouth daily. 05/08/22 05/03/23  Pixie Casino, MD  famotidine (PEPCID) 20 MG tablet TAKE 1 TABLET AT BEDTIME Patient taking differently: Take 20 mg by mouth at bedtime. 11/12/21   Lauraine Rinne, NP  omeprazole (PRILOSEC) 10 MG capsule Take 10 mg by mouth daily.    [provider]  ranolazine (RANEXA) 500 MG 12 hr tablet Take 1 tablet (500 mg total) by mouth 2 (two) times daily. Patient taking differently: Take 500 mg by mouth daily. 01/18/22   Tobb, Kardie, DO      Allergies    Detrol [tolterodine],  Gabapentin, Aspirin, Contrast media [iodinated contrast media], Imitrex [sumatriptan], Oxycodone, Milk-related compounds, Rosuvastatin, Vicodin hp [hydrocodone-acetaminophen], and Avelox [moxifloxacin]    Review of Systems   Review of Systems  Physical Exam Updated Vital Signs BP (!) 172/92 (BP Location: Right Arm)   Pulse 72   Temp 98.1 F (36.7 C) (Oral)   Resp 18   Ht '5\' 4"'$  (1.626 m)   Wt 115.2 kg   SpO2 99%   BMI 43.60 kg/m  Physical Exam Vitals and nursing note reviewed. Exam conducted with a chaperone present.  Constitutional:      General: She is not in acute distress.    Appearance: Normal appearance. She is obese.  HENT:     Head: Normocephalic and atraumatic.     Right Ear: Tympanic membrane normal.     Left Ear: Tympanic membrane normal.     Mouth/Throat:     Mouth: Mucous membranes are moist.     Tonsils: No tonsillar exudate or tonsillar abscesses.     Comments: Uvula is midline, no tonsillar exudate.  No trismus, no sublingual swelling or tenderness. I do not appreciate any swelling to the throat though exam is slightly limited secondary to BMI of 43 Eyes:     General: No scleral icterus.    Extraocular Movements: Extraocular movements intact.     Pupils: Pupils are equal, round, and reactive to light.  Neck:     Comments: Anterior cervical scar Cardiovascular:     Rate and Rhythm: Normal rate and regular rhythm.  Pulmonary:     Effort: Pulmonary effort is normal.     Breath sounds: Normal breath sounds.  Musculoskeletal:     Cervical back: Normal range of motion.  Skin:    Coloration: Skin is not jaundiced.  Neurological:     Mental Status: She is alert. Mental status is at baseline.     Coordination: Coordination normal.  Psychiatric:     Comments: Anxious mood     ED Results / Procedures / Treatments   Labs (all labs ordered are listed, but only abnormal results are displayed) Labs Reviewed  BASIC METABOLIC PANEL  CBC WITH  DIFFERENTIAL/PLATELET    EKG None  Radiology No results found.  Procedures Procedures  {Document cardiac monitor, telemetry assessment procedure when appropriate:1}  Medications Ordered in ED Medications  methylPREDNISolone sodium succinate (SOLU-MEDROL) 40 mg/mL injection 40 mg (has no administration in time range)  diphenhydrAMINE (BENADRYL) capsule 50 mg (has no administration in time range)    Or  diphenhydrAMINE (BENADRYL) injection 50 mg (has no administration in time range)    ED Course/ Medical Decision Making/ A&P Clinical Course as of 06/11/22 0538  Tue Jun 11, 2022  0343 CBC with Differential(!) Patient has a leukocytosis with left shift.  White cell count is 17.1, neutrophil 12.9.  No anemia, normal platelets [HS]  0345 I reviewed external records including CXR from  06/08/22 viewed by myself. Negative for acute process. [HS]  7341 Basic metabolic panel(!) Mildly hyperglycemic at 120.  No gross electrolyte derangement or AKI. [HS]    Clinical Course User Index [HS] Sherrill Raring, PA-C                           Medical Decision Making Amount and/or Complexity of Data Reviewed External Data Reviewed: notes.    Details: Negative strep, COVID, chest x-ray 2 days ago at urgent care. Labs: ordered. Decision-making details documented in ED Course. Radiology: ordered.  Risk Prescription drug management.   Patient presents due to sore throat.  Differential includes but not limited to [place] angina, peritonsillar abscess, retropharyngeal abscess, tonsillitis, viral [place], dental infection.  On exam uvula is midline, handling secretions.  There is no sublingual tenderness or swelling, I do not appreciate any submandibular swelling.  Patient's voice is hoarse but there is no trismus.  Lungs are clear to auscultation without hypoxia or fever.  I reviewed external records including previous negative test and urgent care visit.  I am unable to see the visit with the  PCP.  Patient was negative for strep and COVID.  Chest x-ray was unremarkable from 3 days ago.  I considered repeating but ultimately do not think would be helpful at this point.  Although I feel this is most likely a viral laryngitis is patient's third medical evaluation in a week.  She is very anxious, discussed risks and benefits of CT including patient's history of contrast allergy.  Patient states she does not feel comfortable leaving without additional imaging of her throat.  I will obtain CT to evaluate for possible deeper space infection or swelling.  Patient was premedicated, CT soft tissue neck will be obtained at 7:30 AM [place].  Patient care signed out to PA McCauley at shift change. If CT is negative patient can follow up with ENT as planned on the 14th. .  {Document critical care time when appropriate:1} {Document review of labs and clinical decision tools ie heart score, Chads2Vasc2 etc:1}  {Document your independent review of radiology images, and any outside records:1} {Document your discussion with family members, caretakers, and with consultants:1} {Document social determinants of health affecting pt's care:1} {Document your decision making why or why not admission, treatments were needed:1} Final Clinical Impression(s) / ED Diagnoses Final diagnoses:  None    Rx / DC Orders ED Discharge Orders     None

## 2022-06-11 NOTE — ED Provider Notes (Signed)
Patient care taken over at shift handoff from [facility], PA-C  In short, patient with progressively worsening dysphagia, feeling like swelling is present on the right side of the neck.  Patient complaining of fevers and migraines which seem like typical migraines.  Feels that there is something in her throat and that her voice is different.  Patient was seen 2 days ago at urgent care with a negative strep test and seen at her primary care last Thursday.  Patient has an appointment with ENT tomorrow Physical Exam  BP (!) 171/87   Pulse 65   Temp 98.1 F (36.7 C) (Oral)   Resp 19   Ht '5\' 4"'$  (1.626 m)   Wt 115.2 kg   SpO2 95%   BMI 43.60 kg/m   Physical Exam Vitals reviewed.  HENT:     Head: Normocephalic.  Cardiovascular:     Rate and Rhythm: Normal rate.  Pulmonary:     Effort: Pulmonary effort is normal.  Neurological:     Mental Status: She is alert.     Procedures  Procedures  ED Course / MDM   Clinical Course as of 06/11/22 0708  Tue Jun 11, 2022  0343 CBC with Differential(!) Patient has a leukocytosis with left shift.  White cell count is 17.1, neutrophil 12.9.  No anemia, normal platelets [HS]  0345 I reviewed external records including CXR from 06/08/22 viewed by myself. Negative for acute process. [HS]  1610 Basic metabolic panel(!) Mildly hyperglycemic at 120.  No gross electrolyte derangement or AKI. [HS]    Clinical Course User Index [HS] Sherrill Raring, PA-C   Medical Decision Making Amount and/or Complexity of Data Reviewed Labs: ordered. Decision-making details documented in ED Course. Radiology: ordered.  Risk Prescription drug management.   I viewed and interpreted the CT scan of the neck  1. Suboptimal due to streak artifact in the neck from chronic C4 through C7 ACDF and corpectomy hardware.  2. Moderate symmetric enlargement of the adenoids. But no other acute or inflammatory process identified in the neck. No cervical lymphadenopathy.  3.  Right > left cervical ICA atherosclerosis. I agree with the radiologist findings   The patient has moderate adenoid enlargement, unclear if this could cause some of her symptoms.  Patient does have a leukocytoisis but strep testing was negative at urgent care over the weekend. Patient with history of pharyngeal dysphagia and hypopharyngeal lesion followed by ENT. The patient does have a follow-up appointment scheduled with ENT for tomorrow.  I believe this will be the best specialist to see for further evaluation of her pain and discomfort.  I will discharge the patient home with a lidocaine solution to hopefully help with pain control over the course of the day.    Dorothyann Peng, PA-C 06/11/22 0829    Jeanell Sparrow, DO 06/13/22 1900

## 2022-06-11 NOTE — ED Triage Notes (Signed)
Pt reports with sore throat that has been going on since Last Monday, Pt was tested for strept on Saturday (neg). Pt states that it hurts down her neck and states that she can see something on her throat.

## 2022-06-11 NOTE — Discharge Instructions (Signed)
You were seen today for sore throat. Please follow up with your ENT at your appointment tomorrow. I have prescribed a solution that you may use for pain relief. Do not swallow the solution.

## 2022-06-12 DIAGNOSIS — J069 Acute upper respiratory infection, unspecified: Secondary | ICD-10-CM | POA: Diagnosis not present

## 2022-06-13 DIAGNOSIS — K219 Gastro-esophageal reflux disease without esophagitis: Secondary | ICD-10-CM | POA: Diagnosis not present

## 2022-06-13 DIAGNOSIS — J4520 Mild intermittent asthma, uncomplicated: Secondary | ICD-10-CM | POA: Diagnosis not present

## 2022-06-13 DIAGNOSIS — I10 Essential (primary) hypertension: Secondary | ICD-10-CM | POA: Diagnosis not present

## 2022-06-13 DIAGNOSIS — E7800 Pure hypercholesterolemia, unspecified: Secondary | ICD-10-CM | POA: Diagnosis not present

## 2022-06-14 DIAGNOSIS — H2513 Age-related nuclear cataract, bilateral: Secondary | ICD-10-CM | POA: Diagnosis not present

## 2022-06-14 DIAGNOSIS — H2512 Age-related nuclear cataract, left eye: Secondary | ICD-10-CM | POA: Diagnosis not present

## 2022-06-14 DIAGNOSIS — H25013 Cortical age-related cataract, bilateral: Secondary | ICD-10-CM | POA: Diagnosis not present

## 2022-06-14 DIAGNOSIS — H18413 Arcus senilis, bilateral: Secondary | ICD-10-CM | POA: Diagnosis not present

## 2022-06-14 DIAGNOSIS — H25043 Posterior subcapsular polar age-related cataract, bilateral: Secondary | ICD-10-CM | POA: Diagnosis not present

## 2022-06-18 ENCOUNTER — Other Ambulatory Visit: Payer: Self-pay | Admitting: Physician Assistant

## 2022-06-19 ENCOUNTER — Telehealth: Payer: Self-pay | Admitting: Physician Assistant

## 2022-06-19 ENCOUNTER — Other Ambulatory Visit: Payer: Medicare HMO

## 2022-06-19 DIAGNOSIS — R197 Diarrhea, unspecified: Secondary | ICD-10-CM

## 2022-06-19 DIAGNOSIS — R109 Unspecified abdominal pain: Secondary | ICD-10-CM

## 2022-06-19 MED ORDER — DICYCLOMINE HCL 20 MG PO TABS: 20.0000 mg | ORAL_TABLET | Freq: Four times a day (QID) | ORAL | 3 refills | Status: DC | PRN

## 2022-06-19 NOTE — Telephone Encounter (Signed)
Called and spoke with patient regarding Jennifer's recommendations. Pt would like RX sent to [facility] on file. Pt has been advised to stop by the lab in the basement of our office building to pick up the stool kit. She knows that she can pick up the kit at her convenience between 7:0 am - 5 pm, M-F. Pt knows that we will be in contact regarding her results. Pt verbalized understanding and had no concerns at the end of the call.  GI profile order in epic. Dicyclomine RX sent to [facility], per pt request.

## 2022-06-19 NOTE — Telephone Encounter (Signed)
Returned call to patient. She reports that she recently had a sinus infection and was prescribed a Z-pack. Pt finished antibiotics last Wednesday, and that's when her symptoms started. She thinks that the antibiotic "angered her stomach". She reports abdominal pain, abdomen is tender to the touch, it hurts to walk. Pt also reports diarrhea and loose stools, she has at least 3 medium volume stools, and she does feel like she is emptying. Pt reports that she is still taking Dexilant 60 mg daily and Pepcid 20 mg nightly. Pt reports that she has tried Mylanta, Tylenol, ice pack, and an expired prescription for GI cocktail, none of these measures provided relief for the patient. Pt is tolerating solid foods, but she states that "it goes right through" her. She is staying hydrated by drinking 4-5 bottles of water daily along with apple juice, lemonade, and hot tea. Pt states that she was previously told to stop Dicyclomine so she does not have a prescription for that either. Please advise, thanks.

## 2022-06-19 NOTE — Telephone Encounter (Signed)
Inbound call from patient stating that for the last 4 days she has had abd pain in the middle of her stomach. Patient is seeking advice on what she can take to help. Please advise.

## 2022-06-20 ENCOUNTER — Emergency Department (HOSPITAL_BASED_OUTPATIENT_CLINIC_OR_DEPARTMENT_OTHER): Payer: Medicare HMO

## 2022-06-20 ENCOUNTER — Other Ambulatory Visit: Payer: Self-pay

## 2022-06-20 ENCOUNTER — Encounter (HOSPITAL_BASED_OUTPATIENT_CLINIC_OR_DEPARTMENT_OTHER): Payer: Self-pay | Admitting: Emergency Medicine

## 2022-06-20 ENCOUNTER — Emergency Department (HOSPITAL_BASED_OUTPATIENT_CLINIC_OR_DEPARTMENT_OTHER)
Admission: EM | Admit: 2022-06-20 | Discharge: 2022-06-20 | Disposition: A | Discharge status: Home / Self Care | Payer: Medicare HMO | Attending: Emergency Medicine | Admitting: Emergency Medicine

## 2022-06-20 DIAGNOSIS — R110 Nausea: Secondary | ICD-10-CM | POA: Diagnosis not present

## 2022-06-20 DIAGNOSIS — Z87891 Personal history of nicotine dependence: Secondary | ICD-10-CM | POA: Diagnosis not present

## 2022-06-20 DIAGNOSIS — R1013 Epigastric pain: Secondary | ICD-10-CM | POA: Insufficient documentation

## 2022-06-20 DIAGNOSIS — I10 Essential (primary) hypertension: Secondary | ICD-10-CM | POA: Diagnosis not present

## 2022-06-20 DIAGNOSIS — R509 Fever, unspecified: Secondary | ICD-10-CM | POA: Diagnosis not present

## 2022-06-20 DIAGNOSIS — R109 Unspecified abdominal pain: Secondary | ICD-10-CM | POA: Diagnosis not present

## 2022-06-20 LAB — CBC WITH DIFFERENTIAL/PLATELET
Abs Immature Granulocytes: 0.19 10*3/uL — ABNORMAL HIGH (ref 0.00–0.07)
Basophils Absolute: 0.1 10*3/uL (ref 0.0–0.1)
Basophils Relative: 1 %
Eosinophils Absolute: 0.3 10*3/uL (ref 0.0–0.5)
Eosinophils Relative: 2 %
HCT: 39.1 % (ref 36.0–46.0)
Hemoglobin: 12.8 g/dL (ref 12.0–15.0)
Immature Granulocytes: 1 %
Lymphocytes Relative: 38 %
Lymphs Abs: 5.1 10*3/uL — ABNORMAL HIGH (ref 0.7–4.0)
MCH: 29.5 pg (ref 26.0–34.0)
MCHC: 32.7 g/dL (ref 30.0–36.0)
MCV: 90.1 fL (ref 80.0–100.0)
Monocytes Absolute: 0.7 10*3/uL (ref 0.1–1.0)
Monocytes Relative: 5 %
Neutro Abs: 7 10*3/uL (ref 1.7–7.7)
Neutrophils Relative %: 53 %
Platelets: 396 10*3/uL (ref 150–400)
RBC: 4.34 MIL/uL (ref 3.87–5.11)
RDW: 12 % (ref 11.5–15.5)
WBC: 13.3 10*3/uL — ABNORMAL HIGH (ref 4.0–10.5)
nRBC: 0 % (ref 0.0–0.2)

## 2022-06-20 LAB — COMPREHENSIVE METABOLIC PANEL
ALT: 27 U/L (ref 0–44)
AST: 22 U/L (ref 15–41)
Albumin: 4.6 g/dL (ref 3.5–5.0)
Alkaline Phosphatase: 72 U/L (ref 38–126)
Anion gap: 11 (ref 5–15)
BUN: 11 mg/dL (ref 8–23)
CO2: 26 mmol/L (ref 22–32)
Calcium: 10.2 mg/dL (ref 8.9–10.3)
Chloride: 103 mmol/L (ref 98–111)
Creatinine, Ser: 0.8 mg/dL (ref 0.44–1.00)
GFR, Estimated: >60 mL/min (ref >60)
Glucose, Bld: 125 mg/dL — ABNORMAL HIGH (ref 70–99)
Potassium: 4.2 mmol/L (ref 3.5–5.1)
Sodium: 140 mmol/L (ref 135–145)
Total Bilirubin: 0.4 mg/dL (ref 0.3–1.2)
Total Protein: 7.5 g/dL (ref 6.5–8.1)

## 2022-06-20 LAB — LIPASE, BLOOD: Lipase: 20 U/L (ref 11–51)

## 2022-06-20 LAB — PROTIME-INR
INR: 1 (ref 0.8–1.2)
Prothrombin Time: 13 seconds (ref 11.4–15.2)

## 2022-06-20 MED ORDER — ONDANSETRON HCL 4 MG/2ML IJ SOLN
4.0000 mg | Freq: Once | INTRAMUSCULAR | Status: AC
  Administered 2022-06-20: 4 mg via INTRAVENOUS
  Filled 2022-06-20: qty 2

## 2022-06-20 MED ORDER — SODIUM CHLORIDE 0.9 % IV BOLUS
1000.0000 mL | Freq: Once | INTRAVENOUS | Status: AC
  Administered 2022-06-20: 1000 mL via INTRAVENOUS

## 2022-06-20 MED ORDER — HYDROMORPHONE HCL 1 MG/ML IJ SOLN: 1.0000 mg | Freq: Once | INTRAMUSCULAR | Status: DC

## 2022-06-20 MED ORDER — FENTANYL CITRATE PF 50 MCG/ML IJ SOSY
100.0000 ug | PREFILLED_SYRINGE | Freq: Once | INTRAMUSCULAR | Status: AC
  Administered 2022-06-20: 100 ug via INTRAVENOUS
  Filled 2022-06-20: qty 2

## 2022-06-20 MED ORDER — SODIUM CHLORIDE 0.9 % IV SOLN: Freq: Once | INTRAVENOUS | Status: DC

## 2022-06-20 MED ORDER — FAMOTIDINE IN NACL 20-0.9 MG/50ML-% IV SOLN
20.0000 mg | Freq: Once | INTRAVENOUS | Status: AC
  Administered 2022-06-20: 20 mg via INTRAVENOUS
  Filled 2022-06-20: qty 50

## 2022-06-20 NOTE — ED Provider Notes (Signed)
[facility] Emergency Department Provider Note MRN:  [id number]  Arrival date & time: 06/20/22     Chief Complaint   Abdominal Pain   History of Present Illness   Paula [name] is a 61 y.o. year-old female with a history of fibromyalgia presenting to the ED with chief complaint of abdominal pain.  Epigastric abdominal pain for the past 3 to 4 days, much worse this evening.  Now it is severe.  Associated with nausea, no vomiting, no diarrhea or constipation.  Fever up to 100.6 at home.  Review of Systems  A thorough review of systems was obtained and all systems are negative except as noted in the HPI and PMH.   Patient's Health History    Past Medical History:  Diagnosis Date   Anxiety    pt stated anxiety episodes resemble stroke symptoms   Chronic lower back pain    Conversion disorder    Depression    Diverticulitis    Expressive aphasia 11/15/2016   Fibromyalgia    GERD (gastroesophageal reflux disease)    Heart murmur    High cholesterol    History of gastric ulcer    History of hiatal hernia    History of kidney stones    History of thyroid nodule    Hyperlipidemia    Hypertension    IBS (irritable bowel syndrome)    Intermittent vertigo    Migraine    "a few times/year now; maybe" (11/15/2016)   Obesity    OSA on CPAP    wears CPAP nightly ;study in epic 02-01-2014 (11/15/2016)   Osteoarthritis    "knees, hands, back, hips" (11/15/2016)   Pneumonia X 1   hx of   Psychogenic tremor    intermittant head tremor-(11/15/2016)   Refusal of blood transfusions as patient is Jehovah's Witness    RLS (restless legs syndrome)    Sleep apnea    CPAP   Urinary incontinence    Vertigo     Past Surgical History:  Procedure Laterality Date   ABDOMINAL HYSTERECTOMY  1990   Partial, still has ovaries   ANTERIOR CERVICAL DECOMP/DISCECTOMY FUSION  02/10/2004   C4 -- C7   BACK SURGERY     CARDIOVASCULAR STRESS TEST  2017   Negative    CESAREAN SECTION  1977; 1982   COLONOSCOPY     CYSTOSCOPY W/ RETROGRADES Right 06/07/2015   Procedure: CYSTOSCOPY WITH RETROGRADE PYELOGRAM;  Surgeon: Ardis Hughs, MD;  Location: [facility];  Service: Urology;  Laterality: Right;   CYSTOSCOPY WITH URETEROSCOPY AND STENT PLACEMENT Right 06/07/2015   Procedure: RIGHT URETEROSCOPY, LASER LITHOTRIPSY, STONE REMOVAL AND RIGHT URETERAL STENT PLACEMENT;  Surgeon: Ardis Hughs, MD;  Location: [facility];  Service: Urology;  Laterality: Right;   DIRECT LARYNGOSCOPY N/A 10/29/2021   Procedure: DIRECT LARYNGOSCOPY WITH BIOPSY;  Surgeon: Melida Quitter, MD;  Location: [facility];  Service: ENT;  Laterality: N/A;   HERNIA REPAIR     HOLMIUM LASER APPLICATION N/A 52/84/1324   Procedure: HOLMIUM LASER APPLICATION;  Surgeon: Ardis Hughs, MD;  Location: [facility];  Service: Urology;  Laterality: N/A;   LAPAROSCOPIC CHOLECYSTECTOMY  10/07/2000   LIPOMA EXCISION  01/2017   from neck   UMBILICAL HERNIA REPAIR  age 39   UPPER GI ENDOSCOPY      Family History  Problem Relation Age of Onset   Hyperlipidemia Mother    Hypertension Mother    Sleep apnea Mother  Osteoporosis Mother    Hypertension Father    Heart disease Father    Syncope episode Father    Colon cancer Father    Atrial fibrillation Father    Hypertension Sister    Sleep apnea Sister    Sleep apnea Brother    Hypertension Brother    Colon cancer Maternal Aunt    Lung cancer Maternal Aunt    Hypertension Maternal Grandmother    CVA Maternal Grandmother    Pancreatic cancer Maternal Grandfather    Colon cancer Maternal Grandfather    Hypertension Maternal Grandfather    Lung cancer Maternal Grandfather    Throat cancer Maternal Grandfather    Colon cancer Paternal Grandfather    Esophageal cancer Neg Hx    Liver cancer Neg Hx    Stomach cancer Neg Hx     Social History   Socioeconomic History   Marital status:  Married    Spouse name: Charles   Number of children: 2   Years of education: 11   Highest education level: Not on file  Occupational History   Occupation: disabled  Tobacco Use   Smoking status: Former    Packs/day: 0.50    Years: 25.00    Total pack years: 12.50    Types: Cigarettes    Quit date: 07/24/2008    Years since quitting: 13.9   Smokeless tobacco: Never  Vaping Use   Vaping Use: Never used  Substance and Sexual Activity   Alcohol use: Not Currently    Comment: 5 drinks per year or less   Drug use: No   Sexual activity: Yes  Other Topics Concern   Not on file  Social History Narrative   Married, lives at home      Caffeine use - coffee daily (decaf), tea once a week      Jehovah's Witness - no blood products   Right handed   Social Determinants of Health   Financial Resource Strain: Not on file  Food Insecurity: Not on file  Transportation Needs: Not on file  Physical Activity: Not on file  Stress: Not on file  Social Connections: Not on file  Intimate Partner Violence: Not on file     Physical Exam   Vitals:   06/20/22 0218  BP: (!) 172/82  Pulse: 72  Resp: 16  Temp: 98.4 F (36.9 C)  SpO2: 100%    CONSTITUTIONAL: Well-appearing, in moderate distress due to pain NEURO/PSYCH:  Alert and oriented x 3, no focal deficits EYES:  eyes equal and reactive ENT/NECK:  no LAD, no JVD CARDIO: Regular rate, well-perfused, normal S1 and S2 PULM:  CTAB no wheezing or rhonchi GI/GU:  non-distended, moderate epigastric tenderness to palpation MSK/SPINE:  No gross deformities, no edema SKIN:  no rash, atraumatic   *Additional and/or pertinent findings included in MDM below  Diagnostic and Interventional Summary    EKG Interpretation  Date/Time:    Ventricular Rate:    PR Interval:    QRS Duration:   QT Interval:    QTC Calculation:   R Axis:     Text Interpretation:         Labs Reviewed  CBC WITH DIFFERENTIAL/PLATELET - Abnormal; Notable  for the following components:      Result Value   WBC 13.3 (*)    Lymphs Abs 5.1 (*)    Abs Immature Granulocytes 0.19 (*)    All other components within normal limits  COMPREHENSIVE METABOLIC PANEL - Abnormal; Notable for the  following components:   Glucose, Bld 125 (*)    All other components within normal limits  LIPASE, BLOOD  PROTIME-INR  URINALYSIS, ROUTINE W REFLEX MICROSCOPIC    CT ABDOMEN PELVIS WO CONTRAST  Final Result      Medications  0.9 %  sodium chloride infusion (has no administration in time range)  ondansetron (ZOFRAN) injection 4 mg (4 mg Intravenous Given 06/20/22 0336)  famotidine (PEPCID) IVPB 20 mg premix (20 mg Intravenous New Bag/Given 06/20/22 0338)  sodium chloride 0.9 % bolus 1,000 mL (1,000 mLs Intravenous New Bag/Given 06/20/22 0338)  fentaNYL (SUBLIMAZE) injection 100 mcg (100 mcg Intravenous Given 06/20/22 0335)     Procedures  /  Critical Care Ultrasound ED Peripheral IV (Provider)  Date/Time: 06/20/2022 3:30 AM  Performed by: Maudie Flakes, MD Authorized by: Maudie Flakes, MD   Procedure details:    Indications: poor IV access     Skin Prep: chlorhexidine gluconate     Location:  Right AC   Angiocath:  20 G   Bedside Ultrasound Guided: Yes     Patient tolerated procedure without complications: Yes     Dressing applied: Yes     ED Course and Medical Decision Making  Initial Impression and Ddx Differential diagnosis includes GERD, gastritis, cholecystitis, perforated viscus, pancreatitis, awaiting labs, CT.  Past medical/surgical history that increases complexity of ED encounter: GERD  Interpretation of Diagnostics I personally reviewed the laboratory assessment and my interpretation is as follows: No significant blood count or electrolyte disturbance, normal lipase, normal kidney and renal function  CT scan is without emergent or acute process  Patient Reassessment and Ultimate Disposition/Management     Patient feeling better  on reassessment, normal vital signs, no indication for further testing or admission, appropriate for discharge.  Patient management required discussion with the following services or consulting groups:  None  Complexity of Problems Addressed Acute illness or injury that poses threat of life of bodily function  Additional Data Reviewed and Analyzed Further history obtained from: Further history from spouse/family member  Additional Factors Impacting ED Encounter Risk Use of parenteral controlled substances  Barth Kirks. [place] Small, [place] [email or web address]  Final Clinical Impressions(s) / ED Diagnoses     ICD-10-CM   1. Epigastric pain  R10.13       ED Discharge Orders     None        Discharge Instructions Discussed with and Provided to Patient:     Discharge Instructions      You were evaluated in the Emergency Department and after careful evaluation, we did not find any emergent condition requiring admission or further testing in the hospital.  Your exam/testing today is overall reassuring.  Recommend continued follow-up with your primary care doctor and/or gastroenterologist.  Please return to the Emergency Department if you experience any worsening of your condition.   Thank you for allowing [place] to be a part of your care.       Maudie Flakes, MD 06/20/22 [phone]

## 2022-06-20 NOTE — Discharge Instructions (Signed)
You were evaluated in the Emergency Department and after careful evaluation, we did not find any emergent condition requiring admission or further testing in the hospital.  Your exam/testing today is overall reassuring.  Recommend continued follow-up with your primary care doctor and/or gastroenterologist.  Please return to the Emergency Department if you experience any worsening of your condition.   Thank you for allowing [place] to be a part of your care.

## 2022-06-20 NOTE — ED Triage Notes (Signed)
Pt c/o LUQ pain since yesterday. Pt states that she cannot get comfortable with the pain. Pt endorses nausea.

## 2022-06-21 ENCOUNTER — Other Ambulatory Visit: Payer: Medicare HMO

## 2022-06-21 DIAGNOSIS — R109 Unspecified abdominal pain: Secondary | ICD-10-CM

## 2022-06-21 DIAGNOSIS — Z0389 Encounter for observation for other suspected diseases and conditions ruled out: Secondary | ICD-10-CM | POA: Diagnosis not present

## 2022-06-21 DIAGNOSIS — R197 Diarrhea, unspecified: Secondary | ICD-10-CM

## 2022-06-21 DIAGNOSIS — A048 Other specified bacterial intestinal infections: Secondary | ICD-10-CM | POA: Diagnosis not present

## 2022-06-24 LAB — GI PROFILE, STOOL, PCR

## 2022-06-27 DIAGNOSIS — G4700 Insomnia, unspecified: Secondary | ICD-10-CM | POA: Diagnosis not present

## 2022-06-27 DIAGNOSIS — G8929 Other chronic pain: Secondary | ICD-10-CM | POA: Diagnosis not present

## 2022-06-27 DIAGNOSIS — R5383 Other fatigue: Secondary | ICD-10-CM | POA: Diagnosis not present

## 2022-06-27 DIAGNOSIS — Z79899 Other long term (current) drug therapy: Secondary | ICD-10-CM | POA: Diagnosis not present

## 2022-06-27 DIAGNOSIS — K589 Irritable bowel syndrome without diarrhea: Secondary | ICD-10-CM | POA: Diagnosis not present

## 2022-06-27 DIAGNOSIS — R7303 Prediabetes: Secondary | ICD-10-CM | POA: Diagnosis not present

## 2022-06-27 DIAGNOSIS — E782 Mixed hyperlipidemia: Secondary | ICD-10-CM | POA: Diagnosis not present

## 2022-06-27 DIAGNOSIS — I2510 Atherosclerotic heart disease of native coronary artery without angina pectoris: Secondary | ICD-10-CM | POA: Diagnosis not present

## 2022-06-27 DIAGNOSIS — G4733 Obstructive sleep apnea (adult) (pediatric): Secondary | ICD-10-CM | POA: Diagnosis not present

## 2022-06-27 DIAGNOSIS — Z1331 Encounter for screening for depression: Secondary | ICD-10-CM | POA: Diagnosis not present

## 2022-07-01 DIAGNOSIS — R7303 Prediabetes: Secondary | ICD-10-CM | POA: Diagnosis not present

## 2022-07-03 DIAGNOSIS — J302 Other seasonal allergic rhinitis: Secondary | ICD-10-CM | POA: Diagnosis not present

## 2022-07-03 DIAGNOSIS — R052 Subacute cough: Secondary | ICD-10-CM | POA: Diagnosis not present

## 2022-07-03 DIAGNOSIS — J4520 Mild intermittent asthma, uncomplicated: Secondary | ICD-10-CM | POA: Diagnosis not present

## 2022-07-15 ENCOUNTER — Telehealth: Payer: Self-pay | Admitting: Internal Medicine

## 2022-07-15 DIAGNOSIS — K219 Gastro-esophageal reflux disease without esophagitis: Secondary | ICD-10-CM | POA: Diagnosis not present

## 2022-07-15 DIAGNOSIS — J4520 Mild intermittent asthma, uncomplicated: Secondary | ICD-10-CM | POA: Diagnosis not present

## 2022-07-15 NOTE — Telephone Encounter (Signed)
[facility] called requesting that pt have labs done before her appt with Dr. Debara Pickett 08/27/22. Informed her that pt has an order for labs but unsure if that's the correct order for what they want done. She stated it was okay to just call the pt back if any questions.

## 2022-07-15 NOTE — Telephone Encounter (Signed)
Called patient, she states they needed blood work from May that was drawn- she is aware of the upcoming appointment for blood work. However, she wanted the numbers from her last set of blood work.- gave that information to patient, and send it to PCP as requested by patient.  Thanks!

## 2022-07-18 DIAGNOSIS — E2839 Other primary ovarian failure: Secondary | ICD-10-CM | POA: Diagnosis not present

## 2022-07-18 DIAGNOSIS — I2510 Atherosclerotic heart disease of native coronary artery without angina pectoris: Secondary | ICD-10-CM | POA: Diagnosis not present

## 2022-07-18 DIAGNOSIS — G8929 Other chronic pain: Secondary | ICD-10-CM | POA: Diagnosis not present

## 2022-07-18 DIAGNOSIS — R7303 Prediabetes: Secondary | ICD-10-CM | POA: Diagnosis not present

## 2022-07-18 DIAGNOSIS — G4733 Obstructive sleep apnea (adult) (pediatric): Secondary | ICD-10-CM | POA: Diagnosis not present

## 2022-07-18 DIAGNOSIS — E782 Mixed hyperlipidemia: Secondary | ICD-10-CM | POA: Diagnosis not present

## 2022-07-18 DIAGNOSIS — F419 Anxiety disorder, unspecified: Secondary | ICD-10-CM | POA: Diagnosis not present

## 2022-07-18 DIAGNOSIS — R519 Headache, unspecified: Secondary | ICD-10-CM | POA: Diagnosis not present

## 2022-07-18 DIAGNOSIS — K219 Gastro-esophageal reflux disease without esophagitis: Secondary | ICD-10-CM | POA: Diagnosis not present

## 2022-07-22 DIAGNOSIS — I10 Essential (primary) hypertension: Secondary | ICD-10-CM | POA: Diagnosis not present

## 2022-07-22 DIAGNOSIS — R7303 Prediabetes: Secondary | ICD-10-CM | POA: Diagnosis not present

## 2022-07-22 DIAGNOSIS — R059 Cough, unspecified: Secondary | ICD-10-CM | POA: Diagnosis not present

## 2022-07-24 ENCOUNTER — Ambulatory Visit (INDEPENDENT_AMBULATORY_CARE_PROVIDER_SITE_OTHER): Payer: Medicare HMO | Admitting: Primary Care

## 2022-07-24 ENCOUNTER — Encounter: Payer: Self-pay | Admitting: Primary Care

## 2022-07-24 DIAGNOSIS — R052 Subacute cough: Secondary | ICD-10-CM | POA: Diagnosis not present

## 2022-07-24 NOTE — Patient Instructions (Addendum)
Cough is consistent with upper airway cough syndrome  Cough Home Instructions:  Goal is to eliminate cough for 3 straight days  Avoid coughing or clearing throat by using:  Sugarless candy  Sips of water and ice chips No mint/menthol products    Recommendations  Continue Symbicort two puffs morning and evening until follow-up  Continue reflux medication  Take Mucinex-dm '600mg'$  twice daily  Start nasal spray as directed Please start taking a daily antihistamine such as zyrtec, claritin, allegra, or xyzal (you can get this over the counter) >>> this medication helps with allergies, post nasal drip and cough   Follow-up: - 2-3 weeks with [facility] NP
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
## 2022-07-30 ENCOUNTER — Encounter: Payer: Self-pay | Admitting: Physician Assistant

## 2022-07-30 ENCOUNTER — Ambulatory Visit (INDEPENDENT_AMBULATORY_CARE_PROVIDER_SITE_OTHER): Payer: Medicare HMO | Admitting: Physician Assistant

## 2022-07-30 VITALS — BP 116/70 | HR 75 | Ht 64.0 in | Wt 257.0 lb

## 2022-07-30 DIAGNOSIS — R195 Other fecal abnormalities: Secondary | ICD-10-CM

## 2022-07-30 DIAGNOSIS — R1084 Generalized abdominal pain: Secondary | ICD-10-CM | POA: Diagnosis not present

## 2022-07-30 MED ORDER — DICYCLOMINE HCL 20 MG PO TABS: 20.0000 mg | ORAL_TABLET | Freq: Two times a day (BID) | ORAL | 11 refills | Status: DC

## 2022-07-30 NOTE — Patient Instructions (Signed)
We have sent the following medications to your pharmacy for you to pick up at your convenience: Dicyclomine 20 mg twice daily.   _______________________________________________________  If you are age 62 or older, your body mass index should be between 23-30. Your Body mass index is 44.11 kg/m. If this is out of the aforementioned range listed, please consider follow up with your Primary Care Provider.  If you are age 69 or younger, your body mass index should be between 19-25. Your Body mass index is 44.11 kg/m. If this is out of the aformentioned range listed, please consider follow up with your Primary Care Provider.   ________________________________________________________  The [facility] providers would like to encourage you to use [facility] to communicate with providers for non-urgent requests or questions.  Due to long hold times on the telephone, sending your provider a message by [facility] may be a faster and more efficient way to get a response.  Please allow 48 business hours for a response.  Please remember that this is for non-urgent requests.  _______________________________________________________

## 2022-07-30 NOTE — Progress Notes (Signed)
Chief Complaint: Diarrhea  HPI:    Paula [name] is a 61 year old African-American female with a past medical history as listed below including anxiety, fibromyalgia and IBS, known to Dr. Henrene Pastor, who presents to clinic today for a complaint of diarrhea.    05/15/2021 EGD with reflux and otherwise normal.    05/15/2021 colonoscopy with diverticulosis and internal hemorrhoids and otherwise normal. Repeat recommended 10 years.     02/13/2022 was patient's last office visit to discuss noncardiac chest pain.  At that time discussed that she continue to work with the cardiology team in regards to her anginal symptoms.  She was given GI cocktail just in case.  Also continued on Dexilant 60 mg daily and Pepcid 20 mg at night.  Told to discontinue Pantoprazole.    06/19/2022 patient called and described abdominal pain in the middle of her stomach.  At that time described being diagnosed with a sinus infection and given a Z-Pak.  After using those antibiotics all of her symptoms started with pain and diarrhea.  At that time ordered a GI pathogen panel and prescribed her Dicyclomine 20 mg every 6 hours as needed.    06/20/2022 patient went to the ED for epigastric pain.  CBC with a white count of 13.3, CMP with a glucose of 125, lipase normal.  CT the abdomen pelvis without contrast with no acute intra-abdominal or pelvic pathology, colonic diverticulosis, fatty liver and aortic atherosclerosis.  Patient was given Zofran, IV Pepcid, Fentanyl and sent home.    06/22/2019 3 GI pathogen panel negative.  Patient reported doing better with Dicyclomine on 07/01/2022.    Today, the patient tells me that she is doing much better as long as she takes Dicyclomine 20 mg once a day.  She is having no further epigastric pain and as of this week her diarrhea is better.  Tells me that when she has times of stress or anxiety it seems to go to her stomach and she will have an increase in loose stools throughout the day sometimes 5 times a  day.  Still battling with weight loss.  Overall very happy at the moment.    Hoping to surprise her husband with a dog.    Denies fever, chills, weight loss or blood in her stool.      Past Medical History:  Diagnosis Date   Anxiety    pt stated anxiety episodes resemble stroke symptoms   Chronic lower back pain    Conversion disorder    Depression    Diverticulitis    Expressive aphasia 11/15/2016   Fibromyalgia    GERD (gastroesophageal reflux disease)    Heart murmur    High cholesterol    History of gastric ulcer    History of hiatal hernia    History of kidney stones    History of thyroid nodule    Hyperlipidemia    Hypertension    IBS (irritable bowel syndrome)    Intermittent vertigo    Migraine    "a few times/year now; maybe" (11/15/2016)   Obesity    OSA on CPAP    wears CPAP nightly ;study in epic 02-01-2014 (11/15/2016)   Osteoarthritis    "knees, hands, back, hips" (11/15/2016)   Pneumonia X 1   hx of   Psychogenic tremor    intermittant head tremor-(11/15/2016)   Refusal of blood transfusions as patient is Jehovah's Witness    RLS (restless legs syndrome)    Sleep apnea    CPAP  Urinary incontinence    Vertigo     Past Surgical History:  Procedure Laterality Date   ABDOMINAL HYSTERECTOMY  1990   Partial, still has ovaries   ANTERIOR CERVICAL DECOMP/DISCECTOMY FUSION  02/10/2004   C4 -- C7   BACK SURGERY     CARDIOVASCULAR STRESS TEST  2017   Negative   CESAREAN SECTION  1977; 1982   COLONOSCOPY     CYSTOSCOPY W/ RETROGRADES Right 06/07/2015   Procedure: CYSTOSCOPY WITH RETROGRADE PYELOGRAM;  Surgeon: Ardis Hughs, MD;  Location: [facility];  Service: Urology;  Laterality: Right;   CYSTOSCOPY WITH URETEROSCOPY AND STENT PLACEMENT Right 06/07/2015   Procedure: RIGHT URETEROSCOPY, LASER LITHOTRIPSY, STONE REMOVAL AND RIGHT URETERAL STENT PLACEMENT;  Surgeon: Ardis Hughs, MD;  Location: [facility];   Service: Urology;  Laterality: Right;   DIRECT LARYNGOSCOPY N/A 10/29/2021   Procedure: DIRECT LARYNGOSCOPY WITH BIOPSY;  Surgeon: Melida Quitter, MD;  Location: [facility];  Service: ENT;  Laterality: N/A;   HERNIA REPAIR     HOLMIUM LASER APPLICATION N/A 77/41/2878   Procedure: HOLMIUM LASER APPLICATION;  Surgeon: Ardis Hughs, MD;  Location: [facility];  Service: Urology;  Laterality: N/A;   LAPAROSCOPIC CHOLECYSTECTOMY  10/07/2000   LIPOMA EXCISION  01/2017   from neck   UMBILICAL HERNIA REPAIR  age 50   UPPER GI ENDOSCOPY      Current Outpatient Medications  Medication Sig Dispense Refill   acetaminophen (TYLENOL) 500 MG tablet Take 1,000 mg by mouth every 6 (six) hours as needed for headache (pain).     albuterol (VENTOLIN HFA) 108 (90 Base) MCG/ACT inhaler 2 puffs every 6 (six) hours as needed for shortness of breath or wheezing.     AMBULATORY NON FORMULARY MEDICATION Medication Name: GI Cocktail  37m viscous lidocaine 927m'10mg'$ /6m42micyclomine 270m58malox 450ml40mal  Take 5-10 ml every 4 to 6 hours as needed 450 mL 0   benzonatate (TESSALON) 100 MG capsule Take 1 capsule (100 mg total) by mouth every 8 (eight) hours. 21 capsule 0   Cholecalciferol (VITAMIN D3 PO) Take 1 tablet by mouth at bedtime.     Cyanocobalamin (VITAMIN B 12) 500 MCG TABS 1 tablet     cyclobenzaprine (FLEXERIL) 10 MG tablet Take 1 tablet by mouth at bedtime as needed for muscle spasms.     dexlansoprazole (DEXILANT) 60 MG capsule Take 60 mg by mouth at bedtime.     dicyclomine (BENTYL) 20 MG tablet Take 1 tablet (20 mg total) by mouth every 6 (six) hours as needed (abdominal cramping or pain). 90 tablet 3   DILT-XR 120 MG 24 hr capsule Take by mouth.     escitalopram (LEXAPRO) 10 MG tablet daily.     Evolocumab (REPATHA SURECLICK) 140 M676L SOAJ Inject 1 Dose into the skin every 14 (fourteen) days. (Patient taking differently: Inject 140 mg into the skin every 14 (fourteen) days. Every  other Tuesday) 2 mL 11   famotidine (PEPCID) 20 MG tablet TAKE 1 TABLET AT BEDTIME (Patient taking differently: Take 20 mg by mouth at bedtime.) 90 tablet 1   omeprazole (PRILOSEC) 10 MG capsule Take 10 mg by mouth daily.     ranolazine (RANEXA) 500 MG 12 hr tablet Take 1 tablet (500 mg total) by mouth 2 (two) times daily. (Patient taking differently: Take 500 mg by mouth daily.) 180 tablet 3   Current Facility-Administered Medications  Medication Dose Route Frequency Provider Last Rate Last Admin  0.9 %  sodium chloride infusion  500 mL Intravenous Once Irene Shipper, MD        Allergies as of 07/30/2022 - Review Complete 07/24/2022  Allergen Reaction Noted   Detrol [tolterodine] Other (See Comments) 11/15/2016   Gabapentin Palpitations and Other (See Comments) 07/29/2014   Aspirin Other (See Comments) 06/23/2007   Contrast media [iodinated contrast media] Hives, Itching, and Rash 11/15/2016   Imitrex [sumatriptan] Other (See Comments) 07/29/2014   Oxycodone Nausea And Vomiting 02/11/2012   Milk-related compounds Other (See Comments) 01/31/2022   Rosuvastatin Other (See Comments) 01/31/2022   Vicodin hp [hydrocodone-acetaminophen] Nausea And Vomiting 04/23/2021   Avelox [moxifloxacin] Itching 11/27/2009    Family History  Problem Relation Age of Onset   Hyperlipidemia Mother    Hypertension Mother    Sleep apnea Mother    Osteoporosis Mother    Hypertension Father    Heart disease Father    Syncope episode Father    Colon cancer Father    Atrial fibrillation Father    Hypertension Sister    Sleep apnea Sister    Sleep apnea Brother    Hypertension Brother    Colon cancer Maternal Aunt    Lung cancer Maternal Aunt    Hypertension Maternal Grandmother    CVA Maternal Grandmother    Pancreatic cancer Maternal Grandfather    Colon cancer Maternal Grandfather    Hypertension Maternal Grandfather    Lung cancer Maternal Grandfather    Throat cancer Maternal Grandfather     Colon cancer Paternal Grandfather    Esophageal cancer Neg Hx    Liver cancer Neg Hx    Stomach cancer Neg Hx     Social History   Socioeconomic History   Marital status: Married    Spouse name: Charles   Number of children: 2   Years of education: 11   Highest education level: Not on file  Occupational History   Occupation: disabled  Tobacco Use   Smoking status: Former    Packs/day: 0.50    Years: 25.00    Total pack years: 12.50    Types: Cigarettes    Quit date: 07/24/2008    Years since quitting: 14.0   Smokeless tobacco: Never  Vaping Use   Vaping Use: Never used  Substance and Sexual Activity   Alcohol use: Not Currently    Comment: 5 drinks per year or less   Drug use: No   Sexual activity: Yes  Other Topics Concern   Not on file  Social History Narrative   Married, lives at home      Caffeine use - coffee daily (decaf), tea once a week      Jehovah's Witness - no blood products   Right handed   Social Determinants of Health   Financial Resource Strain: Not on file  Food Insecurity: Not on file  Transportation Needs: Not on file  Physical Activity: Not on file  Stress: Not on file  Social Connections: Not on file  Intimate Partner Violence: Not on file    Review of Systems:    Constitutional: No weight loss, fever or chills Cardiovascular: No chest pain Respiratory: No SOB  Gastrointestinal: See HPI and otherwise negative   Physical Exam:  Vital signs: BP 116/70   Pulse 75   Ht '5\' 4"'$  (1.626 m)   Wt 257 lb (116.6 kg)   BMI 44.11 kg/m    Constitutional:   Pleasant obese AA female appears to be in NAD, Well developed,  Well nourished, alert and cooperative Respiratory: Respirations even and unlabored. Lungs clear to auscultation bilaterally.   No wheezes, crackles, or rhonchi.  Cardiovascular: Normal S1, S2. No MRG. Regular rate and rhythm. No peripheral edema, cyanosis or pallor.  Gastrointestinal:  Soft, nondistended, nontender. No rebound  or guarding. Normal bowel sounds. No appreciable masses or hepatomegaly. Rectal:  Not performed.  Psychiatric: Oriented to person, place and time. Demonstrates good judgement and reason without abnormal affect or behaviors.  RELEVANT LABS AND IMAGING: CBC    Component Value Date/Time   WBC 13.3 (H) 06/20/2022 0218   RBC 4.34 06/20/2022 0218   HGB 12.8 06/20/2022 0218   HCT 39.1 06/20/2022 0218   PLT 396 06/20/2022 0218   MCV 90.1 06/20/2022 0218   MCH 29.5 06/20/2022 0218   MCHC 32.7 06/20/2022 0218   RDW 12.0 06/20/2022 0218   LYMPHSABS 5.1 (H) 06/20/2022 0218   MONOABS 0.7 06/20/2022 0218   EOSABS 0.3 06/20/2022 0218   BASOSABS 0.1 06/20/2022 0218    CMP     Component Value Date/Time   NA 140 06/20/2022 0218   NA 139 12/10/2021 1058   K 4.2 06/20/2022 0218   CL 103 06/20/2022 0218   CO2 26 06/20/2022 0218   GLUCOSE 125 (H) 06/20/2022 0218   BUN 11 06/20/2022 0218   BUN 11 12/10/2021 1058   CREATININE 0.80 06/20/2022 0218   CALCIUM 10.2 06/20/2022 0218   PROT 7.5 06/20/2022 0218   PROT 7.4 08/12/2018 1143   ALBUMIN 4.6 06/20/2022 0218   ALBUMIN 4.9 08/12/2018 1143   AST 22 06/20/2022 0218   ALT 27 06/20/2022 0218   ALKPHOS 72 06/20/2022 0218   BILITOT 0.4 06/20/2022 0218   BILITOT 0.4 08/12/2018 1143   GFRNONAA >60 06/20/2022 0218   GFRAA >60 07/01/2020 0206    Assessment: 1.  Atypical chest pain/epigastric pain: Better on Dicyclomine 20 mg once daily; likely functional 2.  Loose stools: Likely related to IBS, increased during times of stress and anxiety, recent GI path panel negative  Plan: 1.  Symptoms are better on Dicyclomine 20 mg once daily.  Refilled prescription Dicyclomine 20 mg twice a day #60 with 11 refills. 2.  Discussed with patient that stress and anxiety plays a large role in her symptoms which likely represent irritable bowel syndrome.  We discussed.  Explained that she can increase her Dicyclomine up to 4 times a day, would recommend 20 to 30  minutes before meals and at bedtime, if she is having trouble with increased frequency of stools. 3.  Patient to follow in clinic with [place] as needed.  Ellouise Newer, PA-C [facility] 07/30/2022, 2:10 PM  Cc: Antony Contras, MD

## 2022-07-30 NOTE — Progress Notes (Signed)
Noted  

## 2022-08-01 DIAGNOSIS — E782 Mixed hyperlipidemia: Secondary | ICD-10-CM | POA: Diagnosis not present

## 2022-08-01 DIAGNOSIS — K219 Gastro-esophageal reflux disease without esophagitis: Secondary | ICD-10-CM | POA: Diagnosis not present

## 2022-08-01 DIAGNOSIS — I10 Essential (primary) hypertension: Secondary | ICD-10-CM | POA: Diagnosis not present

## 2022-08-01 NOTE — Assessment & Plan Note (Signed)
-   Cough is consistent with upper airway cough syndrome d/t PND and GERD. Goal is to eliminate cough for 3 straight days. Avoid coughing or clearing throat by using ice chips, sips of water and sugar free candy. No mint/menthol products. Advised she continue Symbicort two puffs twice daily until follow-up, take mucinex-dm 1-2 tabs twice daily, continue reflux medication and start OTC fluticasone and oral antihistamine.  FU in 2 weeks or sooner if needed.    Recommendations  ? Continue Symbicort two puffs morning and evening until follow-up  ? Continue reflux medication  ? Take Mucinex-dm '600mg'$  twice daily  ? Start nasal spray as directed ? Please start taking a daily antihistamine such as zyrtec, claritin, allegra, or xyzal (you can get this over the counter) >>> this medication helps with allergies, post nasal drip and cough   Follow-up: - 2-3 weeks with [facility] NP

## 2022-08-07 ENCOUNTER — Encounter: Payer: Self-pay | Admitting: Primary Care

## 2022-08-07 ENCOUNTER — Ambulatory Visit (INDEPENDENT_AMBULATORY_CARE_PROVIDER_SITE_OTHER): Payer: Medicare HMO | Admitting: Primary Care

## 2022-08-07 DIAGNOSIS — R052 Subacute cough: Secondary | ICD-10-CM | POA: Diagnosis not present

## 2022-08-07 DIAGNOSIS — G4733 Obstructive sleep apnea (adult) (pediatric): Secondary | ICD-10-CM | POA: Diagnosis not present

## 2022-08-07 DIAGNOSIS — Z9989 Dependence on other enabling machines and devices: Secondary | ICD-10-CM
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
## 2022-08-07 NOTE — Patient Instructions (Addendum)
[facility] for you to undergo cataract surgery. If you developed a fever, shortness of breath or colored sputum please call our office   Recommendation: - Continue Symbicort 2 puffs morning and evening for 2-4 weeks then you can stop (if cough returns off medication then resume) - Continue Nasal spray daily for another 2-4 weeks  - Decrease mucinex '600mg'$  to twice daily  as needed only for chest congestion/phlegm  - Try simple saline nasal rinse 1-2 times a day over the counter for nasal congestoin/moisture  - Try Tesslone perles 1 tab three times a day for cough suppression  - Start taking a daily antihistamine such as zyrtec, claritin, allegra, or xyzal (you can get this over the counter) >>> this medication helps with allergies, post nasal drip and cough    Follow-up: -3 months with Dr. Elsworth Soho

## 2022-08-08 DIAGNOSIS — E782 Mixed hyperlipidemia: Secondary | ICD-10-CM | POA: Diagnosis not present

## 2022-08-08 DIAGNOSIS — R053 Chronic cough: Secondary | ICD-10-CM | POA: Diagnosis not present

## 2022-08-08 DIAGNOSIS — R7303 Prediabetes: Secondary | ICD-10-CM | POA: Diagnosis not present

## 2022-08-08 DIAGNOSIS — Z6841 Body Mass Index (BMI) 40.0 and over, adult: Secondary | ICD-10-CM | POA: Diagnosis not present

## 2022-08-13 DIAGNOSIS — N3946 Mixed incontinence: Secondary | ICD-10-CM | POA: Diagnosis not present

## 2022-08-13 DIAGNOSIS — Z4689 Encounter for fitting and adjustment of other specified devices: Secondary | ICD-10-CM | POA: Diagnosis not present

## 2022-08-16 DIAGNOSIS — H25812 Combined forms of age-related cataract, left eye: Secondary | ICD-10-CM | POA: Diagnosis not present

## 2022-08-16 DIAGNOSIS — H2512 Age-related nuclear cataract, left eye: Secondary | ICD-10-CM | POA: Diagnosis not present

## 2022-08-16 DIAGNOSIS — H2511 Age-related nuclear cataract, right eye: Secondary | ICD-10-CM | POA: Diagnosis not present

## 2022-08-18 DIAGNOSIS — G4733 Obstructive sleep apnea (adult) (pediatric): Secondary | ICD-10-CM | POA: Diagnosis not present

## 2022-08-18 NOTE — Assessment & Plan Note (Signed)
-   Symptoms consistent with upper airway cough syndrome secondary to PND and GERD. Cough has improved significantly with guaifenesin-dm and fluticasone nasal spray. Continue Symbicort twice daily and reflux medication.

## 2022-08-18 NOTE — Assessment & Plan Note (Signed)
-   Patient is 100% compliant with CPAP use. Average usage 7 hours 6 mins. Pressure 4-20cm h20 (95%-10.9cmh20) without residual apneas (AHI 0.9/hour). No changes. Continue to encourage patient wear CPAP every night.

## 2022-08-21 DIAGNOSIS — E7849 Other hyperlipidemia: Secondary | ICD-10-CM | POA: Diagnosis not present

## 2022-08-22 LAB — NMR, LIPOPROFILE
Cholesterol, Total: 192 mg/dL (ref 100–199)
HDL Particle Number: 37 umol/L (ref >=30.5)
HDL-C: 44 mg/dL (ref >39)
LDL Particle Number: 1379 nmol/L — ABNORMAL HIGH (ref <1000)
LDL Size: 20.2 nm — ABNORMAL LOW (ref >20.5)
LDL-C (NIH Calc): 110 mg/dL — ABNORMAL HIGH (ref 0–99)
LP-IR Score: 70 — ABNORMAL HIGH (ref <=45)
Small LDL Particle Number: 892 nmol/L — ABNORMAL HIGH (ref <=527)
Triglycerides: 221 mg/dL — ABNORMAL HIGH (ref 0–149)

## 2022-08-27 ENCOUNTER — Encounter (HOSPITAL_BASED_OUTPATIENT_CLINIC_OR_DEPARTMENT_OTHER): Payer: Self-pay | Admitting: Internal Medicine

## 2022-08-27 ENCOUNTER — Ambulatory Visit (INDEPENDENT_AMBULATORY_CARE_PROVIDER_SITE_OTHER): Payer: Medicare HMO | Admitting: Internal Medicine

## 2022-08-27 VITALS — BP 132/85 | HR 64 | Ht 64.0 in | Wt 261.0 lb

## 2022-08-27 DIAGNOSIS — I2584 Coronary atherosclerosis due to calcified coronary lesion: Secondary | ICD-10-CM | POA: Diagnosis not present

## 2022-08-27 DIAGNOSIS — E7849 Other hyperlipidemia: Secondary | ICD-10-CM | POA: Diagnosis not present

## 2022-08-27 DIAGNOSIS — M542 Cervicalgia: Secondary | ICD-10-CM

## 2022-08-27 DIAGNOSIS — I2510 Atherosclerotic heart disease of native coronary artery without angina pectoris: Secondary | ICD-10-CM | POA: Diagnosis not present

## 2022-08-27 NOTE — Progress Notes (Signed)
LIPID CLINIC CONSULT NOTE  Chief Complaint:  Follow-up dyslipidemia  Primary Care Physician: Antony Contras, MD  Primary Cardiologist:  Berniece Salines, DO  HPI:  Paula [name] is a 61 y.o. female who is being seen today for the evaluation of dyslipidemia at the request of Antony Contras, MD.  This is a pleasant 61 year old female kindly referred for evaluation management of dyslipidemia by Dr. Harriet Masson.  She has a history of high cholesterol in the past and unfortunately has been intolerant to statins causing significant myalgias.  She also has a history of obesity, chronic pain and fibromyalgia, family history of heart disease and other medication intolerances.  Recently she underwent CT coronary angiography which demonstrated mild nonobstructive coronary disease with a calcium score of 22, 88th percentile for age and sex matched controls.  She was informed of those results today.  She did have recent lipids however showing a significantly elevated cholesterol with total of 384, triglycerides 136, HDL 53 and LDL 304.  She reports diet that seems to have a significant amount of saturated fats but does not eat fried food regularly however does use a lot of sources of saturated fat and would benefit from dietary modification.  That being said given her severely elevated cholesterol, its likely she has familial hyperlipidemia.  She also reported some palpitations today and an EKG was performed.  This was personally reviewed and indicates sinus rhythm, nonspecific T wave changes at 62.  04/23/2022  [name] returns today for follow-up.  She reports compliance with the Repatha although unfortunately did not get her lipids rechecked.  Her LDL was quite high at 304 and we did send her for genetic testing.  This did show 3 variants of unknown significance including mutations in APO A5, LPL and LP(a).  It is expected that her LP(a) is quite high however the Repatha should reduce that by about 20 to 30%.   Unfortunately she could not tolerate statins.  Ultimately she may need additional therapy.  She has also been describing leg pain when she walks.  Is not always associated with walking a certain distance in fact she can have some pain with sitting in certain positions.  She notes also she gets discomfort that in her legs and buttocks when she has bowel movements.  This is concerning for possible lumbosacral disease or perhaps related to spinal cord impingement.  She denies any specific numbness or anesthesia.  She does see orthopedics and I encouraged her to follow-up with them.  My suspicion for arterial insufficiency is low.  Have coronary CT angiography in January which showed a calcium score of 12, 88th percentile for sex and matched control, again suggesting early onset heart disease however only minimal nonobstructive coronary disease was noted.  08/27/2022  [name] seen today in follow-up.  She has done well on Repatha with marked reduction in her lipids.  When I saw her last however her cholesterol was still little higher than ideal.  As mentioned above she has a genetic dyslipidemia.  I advised adding ezetimibe but this never was started.  Repeat lipids are essentially stable.  LDL particle #1379, LDL-C of 110, HDL-C of 44 and triglycerides 221.  Of concern today, she is mention she has been having some pain in the left side of her neck as well as the left clavicular area.  This has been worsening over the past several weeks to months.  Worse when she lays on her left side.  She is also  noted some swelling there.  She gets some hoarseness with her voice and some drainage and difficulty swallowing.  She also has some left ear pain.  She notes no hearing loss.  She denies any fever, she has no constitutional symptoms, no recent infection.  She has had some dental work and has some bridges but this feels different than typical tooth pain.  She has a history of prior ACDF of C4-C7.  This could represent  a cervical radiculopathy.    PMHx:  Past Medical History:  Diagnosis Date   Anxiety    pt stated anxiety episodes resemble stroke symptoms   Chronic lower back pain    Conversion disorder    Depression    Diverticulitis    Expressive aphasia 11/15/2016   Fibromyalgia    GERD (gastroesophageal reflux disease)    Heart murmur    High cholesterol    History of gastric ulcer    History of hiatal hernia    History of kidney stones    History of thyroid nodule    Hyperlipidemia    Hypertension    IBS (irritable bowel syndrome)    Intermittent vertigo    Migraine    "a few times/year now; maybe" (11/15/2016)   Obesity    OSA on CPAP    wears CPAP nightly ;study in epic 02-01-2014 (11/15/2016)   Osteoarthritis    "knees, hands, back, hips" (11/15/2016)   Pneumonia X 1   hx of   Psychogenic tremor    intermittant head tremor-(11/15/2016)   Refusal of blood transfusions as patient is Jehovah's Witness    RLS (restless legs syndrome)    Sleep apnea    CPAP   Urinary incontinence    Vertigo     Past Surgical History:  Procedure Laterality Date   ABDOMINAL HYSTERECTOMY  1990   Partial, still has ovaries   ANTERIOR CERVICAL DECOMP/DISCECTOMY FUSION  02/10/2004   C4 -- C7   BACK SURGERY     CARDIOVASCULAR STRESS TEST  2017   Negative   CESAREAN SECTION  1977; 1982   COLONOSCOPY     CYSTOSCOPY W/ RETROGRADES Right 06/07/2015   Procedure: CYSTOSCOPY WITH RETROGRADE PYELOGRAM;  Surgeon: Ardis Hughs, MD;  Location[facility];  Service: Urology;  Laterality: Right;   CYSTOSCOPY WITH URETEROSCOPY AND STENT PLACEMENT Right 06/07/2015   Procedure: RIGHT URETEROSCOPY, LASER LITHOTRIPSY, STONE REMOVAL AND RIGHT URETERAL STENT PLACEMENT;  Surgeon: Ardis Hughs, MD;  Location: [facility];  Service: Urology;  Laterality: Right;   DIRECT LARYNGOSCOPY N/A 10/29/2021   Procedure: DIRECT LARYNGOSCOPY WITH BIOPSY;  Surgeon: Melida Quitter, MD;   Location: [facility];  Service: ENT;  Laterality: N/A;   HERNIA REPAIR     HOLMIUM LASER APPLICATION N/A 96/78/9381   Procedure: HOLMIUM LASER APPLICATION;  Surgeon: Ardis Hughs, MD;  Location: [facility];  Service: Urology;  Laterality: N/A;   LAPAROSCOPIC CHOLECYSTECTOMY  10/07/2000   LIPOMA EXCISION  01/2017   from neck   UMBILICAL HERNIA REPAIR  age 67   UPPER GI ENDOSCOPY      FAMHx:  Family History  Problem Relation Age of Onset   Hyperlipidemia Mother    Hypertension Mother    Sleep apnea Mother    Osteoporosis Mother    Hypertension Father    Heart disease Father    Syncope episode Father    Colon cancer Father    Atrial fibrillation Father    Hypertension Sister  Sleep apnea Sister    Sleep apnea Brother    Hypertension Brother    Colon cancer Maternal Aunt    Lung cancer Maternal Aunt    Hypertension Maternal Grandmother    CVA Maternal Grandmother    Pancreatic cancer Maternal Grandfather    Colon cancer Maternal Grandfather    Hypertension Maternal Grandfather    Lung cancer Maternal Grandfather    Throat cancer Maternal Grandfather    Colon cancer Paternal Grandfather    Esophageal cancer Neg Hx    Liver cancer Neg Hx    Stomach cancer Neg Hx     SOCHx:   reports that she quit smoking about 14 years ago. Her smoking use included cigarettes. She has a 12.50 pack-year smoking history. She has never used smokeless tobacco. She reports that she does not currently use alcohol. She reports that she does not use drugs.  ALLERGIES:  Allergies  Allergen Reactions   Detrol [Tolterodine] Other (See Comments)    slurred speech, tremors, dizziness   Gabapentin Palpitations and Other (See Comments)    Wt gain, tremor   Aspirin Other (See Comments)    Avoids--- history of gastric ulcers    Contrast Media [Iodinated Contrast Media] Hives, Itching and Rash    CT contrast-Needed to take benadryl    Imitrex [Sumatriptan] Other (See Comments)     Body hurts   Oxycodone Nausea And Vomiting   Milk-Related Compounds Other (See Comments)   Rosuvastatin Other (See Comments)   Vicodin Hp [Hydrocodone-Acetaminophen] Nausea And Vomiting   Avelox [Moxifloxacin] Itching    ROS: Pertinent items noted in HPI and remainder of comprehensive ROS otherwise negative.  HOME MEDS: Current Outpatient Medications on File Prior to Visit  Medication Sig Dispense Refill   acetaminophen (TYLENOL) 500 MG tablet Take 1,000 mg by mouth every 6 (six) hours as needed for headache (pain).     albuterol (VENTOLIN HFA) 108 (90 Base) MCG/ACT inhaler 2 puffs every 6 (six) hours as needed for shortness of breath or wheezing.     amLODipine (NORVASC) 5 MG tablet Take 5 mg by mouth daily.     benzonatate (TESSALON) 100 MG capsule Take 1 capsule (100 mg total) by mouth every 8 (eight) hours. 21 capsule 0   Cholecalciferol (VITAMIN D-1000 MAX ST) 25 MCG (1000 UT) tablet Take by mouth.     Cyanocobalamin (VITAMIN B 12) 500 MCG TABS 1 tablet     cyclobenzaprine (FLEXERIL) 10 MG tablet Take 1 tablet by mouth at bedtime as needed for muscle spasms.     dexlansoprazole (DEXILANT) 60 MG capsule Take 60 mg by mouth at bedtime.     dicyclomine (BENTYL) 20 MG tablet Take 1 tablet (20 mg total) by mouth 2 (two) times daily. 60 tablet 11   DILT-XR 120 MG 24 hr capsule Take by mouth.     escitalopram (LEXAPRO) 10 MG tablet daily.     Evolocumab (REPATHA SURECLICK) 852 MG/ML SOAJ Inject 1 Dose into the skin every 14 (fourteen) days. (Patient taking differently: Inject 140 mg into the skin every 14 (fourteen) days. Every other Tuesday) 2 mL 11   famotidine (PEPCID) 20 MG tablet TAKE 1 TABLET AT BEDTIME (Patient taking differently: Take 20 mg by mouth at bedtime.) 90 tablet 1   fluticasone (FLONASE) 50 MCG/ACT nasal spray Place 2 sprays into both nostrils as needed.     ketorolac (ACULAR) 0.5 % ophthalmic solution SMARTSIG:In Eye(s)     moxifloxacin (VIGAMOX) 0.5 % ophthalmic  solution Apply  to eye.     pantoprazole (PROTONIX) 40 MG tablet Take by mouth.     ranolazine (RANEXA) 500 MG 12 hr tablet Take 1 tablet (500 mg total) by mouth 2 (two) times daily. (Patient taking differently: Take 500 mg by mouth daily.) 180 tablet 3   SYMBICORT 160-4.5 MCG/ACT inhaler as needed.     Vitamin E 670 MG (1000 UT) CAPS Take by mouth.     Current Facility-Administered Medications on File Prior to Visit  Medication Dose Route Frequency Provider Last Rate Last Admin   0.9 %  sodium chloride infusion  500 mL Intravenous Once Irene Shipper, MD        LABS/IMAGING: No results found for this or any previous visit (from the past 48 hour(s)). No results found.  LIPID PANEL:    Component Value Date/Time   CHOL 281 (H) 07/01/2020 0206   TRIG 175 (H) 07/01/2020 0206   HDL 44 07/01/2020 0206   CHOLHDL 6.4 07/01/2020 0206   VLDL 35 07/01/2020 0206   LDLCALC 202 (H) 07/01/2020 0206   LDLDIRECT 170.1 01/08/2010 0943    WEIGHTS: Wt Readings from Last 3 Encounters:  08/27/22 261 lb (118.4 kg)  08/07/22 260 lb (117.9 kg)  07/30/22 257 lb (116.6 kg)    VITALS: BP 132/85   Pulse 64   Ht '5\' 4"'$  (1.626 m)   Wt 261 lb (118.4 kg)   SpO2 98%   BMI 44.80 kg/m   EXAM: General appearance: alert, no distress, and morbidly obese Neck: no carotid bruit, thyroid not enlarged, symmetric, no tenderness/mass/nodules, and mild tenderness to palpation of the left neck, some fatty deposition over the left supraclavicular area similar to the right side, no obvious lymph node enlargement, reduced range of motion of the cervical spine with regards to lateral rotation Heart: regular rate and rhythm, S1, S2 normal, no murmur, click, rub or gallop  EKG: Deferred  ASSESSMENT: Genetically confirmed familial hyperlipidemia, LDL greater than 300, notable mutations in APO A5, LPL and LP(a) Premature coronary disease with a calcium score 79, 88th percentile for age and sex matched controls, minimal  nonobstructive disease by CTA (12/2021) Family history of coronary disease Atherogenic diet Morbid obesity Statin intolerance-myalgias Leg pain with walking/lumbosacral pain, worse with defecation Left neck pain  PLAN: 1.   Ms. Saddler has had improvement in her lipids but still has some residual elevation in her cholesterol.  I previously recommended ezetimibe but she never got the prescription filled.  She is not interested in taking it and will start on it with a plan repeat lipid NMR in about 3 to 4 months.  She also mention today she is having left neck pain.  Worse leaning to the side.  She feels some swelling or fullness on that side however on exam it looks somewhat symmetrical compared to the right side.  She does have a history of ACDF at levels C4-C7 in the past.  This could be related and she might have a cervical radiculopathy -she also reports some vocal hoarseness and increase in drainage as well as pain around the left ear.  We will repeat a noncontrast CT scan of the neck to evaluate for any new pathology.  I have encouraged her to follow-up with her PCP regarding this.  Otherwise plan follow-up with me in 3 to 4 months.  Pixie Casino, MD, [facility], [place] Director of the Advanced Lipid Disorders &  Cardiovascular Risk Reduction Clinic  Diplomate of the [employer] of Clinical Lipidology Attending Cardiologist  Direct Dial: [phone]  Fax: [phone]  Website:  [email or web address] Jahzir Strohmeier 08/27/2022, 11:10 AM

## 2022-08-27 NOTE — Patient Instructions (Signed)
Medication Instructions:  START zetia '10mg'$  daily  *If you need a refill on your cardiac medications before your next appointment, please call your pharmacy*   Lab Work: FASTING lab work to check cholesterol in 3-4 months   If you have labs (blood work) drawn today and your tests are completely normal, you will receive your results only by: [place] (if you have MyChart) OR A paper copy in the mail If you have any lab test that is abnormal or we need to change your treatment, we will call you to review the results.   Testing/Procedures: Non-Contrast Soft Tissue Neck CT   Follow-Up: At [facility], you and your health needs are our priority.  As part of our continuing mission to provide you with exceptional heart care, we have created designated Provider Care Teams.  These Care Teams include your primary Cardiologist (physician) and Advanced Practice Providers (APPs -  Physician Assistants and Nurse Practitioners) who all work together to provide you with the care you need, when you need it.  We recommend signing up for the patient portal called "MyChart".  Sign up information is provided on this After Visit Summary.  MyChart is used to connect with patients for Virtual Visits (Telemedicine).  Patients are able to view lab/test results, encounter notes, upcoming appointments, etc.  Non-urgent messages can be sent to your provider as well.   To learn more about what you can do with MyChart, go to [email or web address].    Your next appointment:    3-4 months with Dr. Debara Pickett

## 2022-08-28 ENCOUNTER — Telehealth: Payer: Self-pay | Admitting: Internal Medicine

## 2022-08-28 MED ORDER — EZETIMIBE 10 MG PO TABS: 10.0000 mg | ORAL_TABLET | Freq: Every day | ORAL | 3 refills | Status: DC

## 2022-08-28 NOTE — Telephone Encounter (Signed)
Catarina Hartshorn CMA sent to [facility] and left patient message

## 2022-08-28 NOTE — Telephone Encounter (Signed)
Pt states that she was prescribed a new medication at visit yesterday and medication has not been called into pharmacy. Please advise

## 2022-08-28 NOTE — Addendum Note (Signed)
Addended by: Vennie Homans on: 08/28/2022 10:05 AM   Modules accepted: Orders

## 2022-08-29 ENCOUNTER — Telehealth (HOSPITAL_BASED_OUTPATIENT_CLINIC_OR_DEPARTMENT_OTHER): Payer: Self-pay | Admitting: Internal Medicine

## 2022-08-29 NOTE — Telephone Encounter (Signed)
Spoke with patient regarding her upcoming CT soft tissue neck without contrast scheduled Saturday 09/07/22 at 1:00pm here at [facility]--arrival time is 12:45pm for check in ---ground floor radiology  --will mail information to patient and she voiced her understanding.

## 2022-08-29 NOTE — Addendum Note (Signed)
Addended by: Fidel Levy on: 08/29/2022 10:15 AM   Modules accepted: Orders

## 2022-09-07 ENCOUNTER — Ambulatory Visit (HOSPITAL_BASED_OUTPATIENT_CLINIC_OR_DEPARTMENT_OTHER)
Admission: RE | Admit: 2022-09-07 | Discharge: 2022-09-07 | Disposition: A | Discharge status: Home / Self Care | Payer: Medicare HMO | Source: Ambulatory Visit | Attending: Internal Medicine | Admitting: Internal Medicine

## 2022-09-07 DIAGNOSIS — M542 Cervicalgia: Secondary | ICD-10-CM | POA: Diagnosis not present

## 2022-09-09 DIAGNOSIS — R053 Chronic cough: Secondary | ICD-10-CM | POA: Diagnosis not present

## 2022-09-09 DIAGNOSIS — K580 Irritable bowel syndrome with diarrhea: Secondary | ICD-10-CM | POA: Diagnosis not present

## 2022-09-09 DIAGNOSIS — R7303 Prediabetes: Secondary | ICD-10-CM | POA: Diagnosis not present

## 2022-09-13 DIAGNOSIS — R059 Cough, unspecified: Secondary | ICD-10-CM | POA: Diagnosis not present

## 2022-09-13 DIAGNOSIS — J302 Other seasonal allergic rhinitis: Secondary | ICD-10-CM | POA: Diagnosis not present

## 2022-09-26 DIAGNOSIS — R6883 Chills (without fever): Secondary | ICD-10-CM | POA: Diagnosis not present

## 2022-09-26 DIAGNOSIS — R051 Acute cough: Secondary | ICD-10-CM | POA: Diagnosis not present

## 2022-09-26 DIAGNOSIS — Z03818 Encounter for observation for suspected exposure to other biological agents ruled out: Secondary | ICD-10-CM | POA: Diagnosis not present

## 2022-09-26 DIAGNOSIS — R52 Pain, unspecified: Secondary | ICD-10-CM | POA: Diagnosis not present

## 2022-10-17 DIAGNOSIS — J019 Acute sinusitis, unspecified: Secondary | ICD-10-CM | POA: Diagnosis not present

## 2022-10-17 DIAGNOSIS — R059 Cough, unspecified: Secondary | ICD-10-CM | POA: Diagnosis not present

## 2022-10-21 DIAGNOSIS — R7303 Prediabetes: Secondary | ICD-10-CM | POA: Diagnosis not present

## 2022-10-21 DIAGNOSIS — K580 Irritable bowel syndrome with diarrhea: Secondary | ICD-10-CM | POA: Diagnosis not present

## 2022-10-21 DIAGNOSIS — Z6841 Body Mass Index (BMI) 40.0 and over, adult: Secondary | ICD-10-CM | POA: Diagnosis not present

## 2022-11-08 ENCOUNTER — Emergency Department (HOSPITAL_COMMUNITY)
Admission: EM | Admit: 2022-11-08 | Discharge: 2022-11-08 | Disposition: A | Discharge status: Home / Self Care | Payer: Medicare HMO | Attending: Emergency Medicine | Admitting: Emergency Medicine

## 2022-11-08 ENCOUNTER — Emergency Department (HOSPITAL_COMMUNITY): Payer: Medicare HMO

## 2022-11-08 DIAGNOSIS — Z79899 Other long term (current) drug therapy: Secondary | ICD-10-CM | POA: Diagnosis not present

## 2022-11-08 DIAGNOSIS — Z7951 Long term (current) use of inhaled steroids: Secondary | ICD-10-CM | POA: Insufficient documentation

## 2022-11-08 DIAGNOSIS — F444 Conversion disorder with motor symptom or deficit: Secondary | ICD-10-CM | POA: Diagnosis not present

## 2022-11-08 DIAGNOSIS — R519 Headache, unspecified: Secondary | ICD-10-CM | POA: Insufficient documentation

## 2022-11-08 DIAGNOSIS — G9389 Other specified disorders of brain: Secondary | ICD-10-CM | POA: Diagnosis not present

## 2022-11-08 DIAGNOSIS — R4781 Slurred speech: Secondary | ICD-10-CM | POA: Diagnosis not present

## 2022-11-08 DIAGNOSIS — R531 Weakness: Secondary | ICD-10-CM | POA: Diagnosis not present

## 2022-11-08 DIAGNOSIS — R29810 Facial weakness: Secondary | ICD-10-CM | POA: Diagnosis not present

## 2022-11-08 DIAGNOSIS — R29818 Other symptoms and signs involving the nervous system: Secondary | ICD-10-CM | POA: Diagnosis not present

## 2022-11-08 DIAGNOSIS — R2990 Unspecified symptoms and signs involving the nervous system: Secondary | ICD-10-CM

## 2022-11-08 DIAGNOSIS — I10 Essential (primary) hypertension: Secondary | ICD-10-CM | POA: Diagnosis not present

## 2022-11-08 LAB — I-STAT CHEM 8, ED
BUN: 11 mg/dL (ref 8–23)
Calcium, Ion: 1.29 mmol/L (ref 1.15–1.40)
Chloride: 107 mmol/L (ref 98–111)
Creatinine, Ser: 0.8 mg/dL (ref 0.44–1.00)
Glucose, Bld: 92 mg/dL (ref 70–99)
HCT: 39 % (ref 36.0–46.0)
Hemoglobin: 13.3 g/dL (ref 12.0–15.0)
Potassium: 4.1 mmol/L (ref 3.5–5.1)
Sodium: 141 mmol/L (ref 135–145)
TCO2: 26 mmol/L (ref 22–32)

## 2022-11-08 LAB — COMPREHENSIVE METABOLIC PANEL
ALT: 33 U/L (ref 0–44)
AST: 31 U/L (ref 15–41)
Albumin: 4.2 g/dL (ref 3.5–5.0)
Alkaline Phosphatase: 68 U/L (ref 38–126)
Anion gap: 8 (ref 5–15)
BUN: 10 mg/dL (ref 8–23)
CO2: 26 mmol/L (ref 22–32)
Calcium: 10.1 mg/dL (ref 8.9–10.3)
Chloride: 108 mmol/L (ref 98–111)
Creatinine, Ser: 0.84 mg/dL (ref 0.44–1.00)
GFR, Estimated: >60 mL/min (ref >60)
Glucose, Bld: 99 mg/dL (ref 70–99)
Potassium: 4.2 mmol/L (ref 3.5–5.1)
Sodium: 142 mmol/L (ref 135–145)
Total Bilirubin: 0.6 mg/dL (ref 0.3–1.2)
Total Protein: 7.2 g/dL (ref 6.5–8.1)

## 2022-11-08 LAB — CBC
HCT: 39.3 % (ref 36.0–46.0)
Hemoglobin: 13.1 g/dL (ref 12.0–15.0)
MCH: 29.7 pg (ref 26.0–34.0)
MCHC: 33.3 g/dL (ref 30.0–36.0)
MCV: 89.1 fL (ref 80.0–100.0)
Platelets: 353 10*3/uL (ref 150–400)
RBC: 4.41 MIL/uL (ref 3.87–5.11)
RDW: 12.1 % (ref 11.5–15.5)
WBC: 9.2 10*3/uL (ref 4.0–10.5)
nRBC: 0 % (ref 0.0–0.2)

## 2022-11-08 LAB — URINALYSIS, ROUTINE W REFLEX MICROSCOPIC
Bilirubin Urine: NEGATIVE
Glucose, UA: NEGATIVE mg/dL
Ketones, ur: NEGATIVE mg/dL
Leukocytes,Ua: NEGATIVE
Nitrite: NEGATIVE
Protein, ur: NEGATIVE mg/dL
Specific Gravity, Urine: 1.011 (ref 1.005–1.030)
pH: 6 (ref 5.0–8.0)

## 2022-11-08 LAB — RAPID URINE DRUG SCREEN, HOSP PERFORMED
Amphetamines: NOT DETECTED
Barbiturates: NOT DETECTED
Benzodiazepines: NOT DETECTED
Cocaine: NOT DETECTED
Opiates: NOT DETECTED
Tetrahydrocannabinol: NOT DETECTED

## 2022-11-08 LAB — PROTIME-INR
INR: 1 (ref 0.8–1.2)
Prothrombin Time: 13.1 seconds (ref 11.4–15.2)

## 2022-11-08 LAB — DIFFERENTIAL
Abs Immature Granulocytes: 0.02 10*3/uL (ref 0.00–0.07)
Basophils Absolute: 0.1 10*3/uL (ref 0.0–0.1)
Basophils Relative: 1 %
Eosinophils Absolute: 0.3 10*3/uL (ref 0.0–0.5)
Eosinophils Relative: 3 %
Immature Granulocytes: 0 %
Lymphocytes Relative: 47 %
Lymphs Abs: 4.3 10*3/uL — ABNORMAL HIGH (ref 0.7–4.0)
Monocytes Absolute: 0.6 10*3/uL (ref 0.1–1.0)
Monocytes Relative: 6 %
Neutro Abs: 4 10*3/uL (ref 1.7–7.7)
Neutrophils Relative %: 43 %

## 2022-11-08 LAB — CBG MONITORING, ED: Glucose-Capillary: 89 mg/dL (ref 70–99)

## 2022-11-08 LAB — ETHANOL: Alcohol, Ethyl (B): <10 mg/dL (ref <10)

## 2022-11-08 LAB — APTT: aPTT: 30 seconds (ref 24–36)

## 2022-11-08 MED ORDER — ACETAMINOPHEN 500 MG PO TABS
1000.0000 mg | ORAL_TABLET | Freq: Once | ORAL | Status: AC
  Administered 2022-11-08: 1000 mg via ORAL
  Filled 2022-11-08: qty 2

## 2022-11-08 MED ORDER — LORAZEPAM 1 MG PO TABS
0.5000 mg | ORAL_TABLET | Freq: Once | ORAL | Status: AC
  Administered 2022-11-08: 0.5 mg via ORAL
  Filled 2022-11-08: qty 1

## 2022-11-08 NOTE — ED Provider Triage Note (Signed)
Emergency Medicine Provider Triage Evaluation Note  Paula [name] , a 60 y.o. female  was evaluated in triage.  Pt complains of stuttering speech, facial droop.  Patient was seen at ophthalmology office today and noted to have right-sided facial droop, slurred speech.  Patient was seen in ophthalmology today for a postop check of pseudophakia.  Patient noted to have binocular diplopia test.  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  The patient was sent here for stroke workup.  Patient unable to perform pronator drift, unable to assess EOMs.  The patient initially had facial droop on the right however no longer has facial droop on my examination.  Patient has uncontrollable tremors which her husband states her known issue.  The patient has 5 out of 5 strength bilateral lower extremities.  Patient has 5 out of 5 strength bilateral upper extremities.  Patient able to perform heel-to-shin.  Code stroke initiated.  Review of Systems  Positive:  Negative:   Physical Exam  BP (!) 171/94   Pulse 66   Resp 20   SpO2 98%  Gen:   Awake, no distress   Resp:  Normal effort  MSK:   Moves extremities without difficulty  Other:    Medical Decision Making  Medically screening exam initiated at 1:58 PM.  Appropriate orders placed.  Paula [name] was informed that the remainder of the evaluation will be completed by another provider, this initial triage assessment does not replace that evaluation, and the importance of remaining in the ED until their evaluation is complete.     Azucena Cecil, PA-C 11/08/22 1400

## 2022-11-08 NOTE — ED Provider Notes (Signed)
[facility] EMERGENCY DEPARTMENT Provider Note   CSN: [id number] Arrival date & time: 11/08/22  1339  An emergency department physician performed an initial assessment on this suspected stroke patient at 1357.  History  Chief Complaint  Patient presents with   Code Stroke    Paula [name] is a 61 y.o. female.  Code stroke activated in triage.  Dr. Quinn Axe with neurology at bedside upon my interview with the patient.  Patient states that she has had some stuttering speech, facial droop.  Patient was seen by ophthalmologist today and noted to have some right-sided facial droop and slurred speech.  Patient was here for postop check and ophthalmology.  Patient states she has been under a lot of stress.  Her husband has PTSD and that causes her to have bad headaches and tremors and issues at times.  Code stroke activated in triage.  She complains of headache but no obvious vision changes otherwise.  No weakness or numbness.  She denies any suicidal homicidal ideation.  The history is provided by the patient.       Home Medications Prior to Admission medications   Medication Sig Start Date End Date Taking? Authorizing Provider  acetaminophen (TYLENOL) 500 MG tablet Take 1,000 mg by mouth every 6 (six) hours as needed for headache (pain).    [provider]  albuterol (VENTOLIN HFA) 108 (90 Base) MCG/ACT inhaler 2 puffs every 6 (six) hours as needed for shortness of breath or wheezing. 01/11/21   [provider]  amLODipine (NORVASC) 5 MG tablet Take 5 mg by mouth daily. 06/03/22   [provider]  benzonatate (TESSALON) 100 MG capsule Take 1 capsule (100 mg total) by mouth every 8 (eight) hours. 06/08/22   Avegno, Darrelyn Hillock, FNP  Cholecalciferol (VITAMIN D-1000 MAX ST) 25 MCG (1000 UT) tablet Take by mouth.    [provider]  Cyanocobalamin (VITAMIN B 12) 500 MCG TABS 1 tablet 03/19/22   [provider]  cyclobenzaprine (FLEXERIL)  10 MG tablet Take 1 tablet by mouth at bedtime as needed for muscle spasms.    [provider]  dexlansoprazole (DEXILANT) 60 MG capsule Take 60 mg by mouth at bedtime. 09/17/21   [provider]  dicyclomine (BENTYL) 20 MG tablet Take 1 tablet (20 mg total) by mouth 2 (two) times daily. 07/30/22   Levin Erp, PA  DILT-XR 120 MG 24 hr capsule Take by mouth. 03/21/22   [provider]  escitalopram (LEXAPRO) 10 MG tablet daily. 12/13/21   [provider]  Evolocumab (REPATHA SURECLICK) 539 MG/ML SOAJ Inject 1 Dose into the skin every 14 (fourteen) days. Patient taking differently: Inject 140 mg into the skin every 14 (fourteen) days. Every other Tuesday 12/24/21   Pixie Casino, MD  ezetimibe (ZETIA) 10 MG tablet Take 1 tablet (10 mg total) by mouth daily. 08/28/22 08/23/23  Pixie Casino, MD  famotidine (PEPCID) 20 MG tablet TAKE 1 TABLET AT BEDTIME Patient taking differently: Take 20 mg by mouth at bedtime. 11/12/21   Lauraine Rinne, NP  fluticasone (FLONASE) 50 MCG/ACT nasal spray Place 2 sprays into both nostrils as needed. 07/03/22   [provider]  ketorolac (ACULAR) 0.5 % ophthalmic solution SMARTSIG:In Eye(s) 08/17/22   [provider]  moxifloxacin (VIGAMOX) 0.5 % ophthalmic solution Apply to eye. 08/17/22   [provider]  pantoprazole (PROTONIX) 40 MG tablet Take by mouth. 05/30/22   [provider]  ranolazine (RANEXA) 500  MG 12 hr tablet Take 1 tablet (500 mg total) by mouth 2 (two) times daily. Patient taking differently: Take 500 mg by mouth daily. 01/18/22   Tobb, Godfrey Pick, DO  SYMBICORT 160-4.5 MCG/ACT inhaler as needed. 07/04/22   [provider]  Vitamin E 670 MG (1000 UT) CAPS Take by mouth.    [provider]      Allergies    Detrol [tolterodine], Gabapentin, Aspirin, Contrast media [iodinated contrast media], Imitrex [sumatriptan], Oxycodone, Milk-related compounds, Rosuvastatin,  Vicodin hp [hydrocodone-acetaminophen], and Avelox [moxifloxacin]    Review of Systems   Review of Systems  Physical Exam Updated Vital Signs BP (!) 158/76   Pulse 63   Temp 98.6 F (37 C)   Resp 17   Wt 116.4 kg   SpO2 100%   BMI 44.05 kg/m  Physical Exam Vitals and nursing note reviewed.  Constitutional:      General: She is not in acute distress.    Appearance: She is well-developed.  HENT:     Head: Normocephalic and atraumatic.     Nose: Nose normal.     Mouth/Throat:     Mouth: Mucous membranes are moist.  Eyes:     Extraocular Movements: Extraocular movements intact.     Conjunctiva/sclera: Conjunctivae normal.     Pupils: Pupils are equal, round, and reactive to light.     Comments: Left pupil is more dilated than the right but patient just had dilated eye exam but was not dilated in the right eye  Cardiovascular:     Rate and Rhythm: Normal rate and regular rhythm.     Pulses: Normal pulses.     Heart sounds: Normal heart sounds. No murmur heard. Pulmonary:     Effort: Pulmonary effort is normal. No respiratory distress.     Breath sounds: Normal breath sounds.  Abdominal:     Palpations: Abdomen is soft.     Tenderness: There is no abdominal tenderness.  Musculoskeletal:        General: No swelling.     Cervical back: Neck supple.  Skin:    General: Skin is warm and dry.     Capillary Refill: Capillary refill takes less than 2 seconds.  Neurological:     General: No focal deficit present.     Mental Status: She is alert and oriented to person, place, and time.     Cranial Nerves: No cranial nerve deficit.     Sensory: No sensory deficit.     Motor: No weakness.     Coordination: Coordination normal.  Psychiatric:        Mood and Affect: Mood normal.     ED Results / Procedures / Treatments   Labs (all labs ordered are listed, but only abnormal results are displayed) Labs Reviewed  DIFFERENTIAL - Abnormal; Notable for the following components:       Result Value   Lymphs Abs 4.3 (*)    All other components within normal limits  ETHANOL  PROTIME-INR  APTT  CBC  COMPREHENSIVE METABOLIC PANEL  RAPID URINE DRUG SCREEN, HOSP PERFORMED  URINALYSIS, ROUTINE W REFLEX MICROSCOPIC  I-STAT CHEM 8, ED  CBG MONITORING, ED    EKG EKG Interpretation  Date/Time:  Friday November 08 2022 14:23:17 EST Ventricular Rate:  71 PR Interval:  143 QRS Duration: 94 QT Interval:  526 QTC Calculation: 572 R Axis:   6 Text Interpretation: Sinus rhythm Nonspecific T abnormalities, diffuse leads P Confirmed by Ronnald Nian, Dayshia Ballinas ([id number]) on 11/08/2022 2:41:28  PM  Radiology CT HEAD CODE STROKE WO CONTRAST  Result Date: 11/08/2022 CLINICAL DATA:  Code stroke. Neuro deficit, acute, stroke suspected. Additional history provided: Right-sided weakness. EXAM: CT HEAD WITHOUT CONTRAST TECHNIQUE: Contiguous axial images were obtained from the base of the skull through the vertex without intravenous contrast. RADIATION DOSE REDUCTION: This exam was performed according to the departmental dose-optimization program which includes automated exposure control, adjustment of the mA and/or kV according to patient size and/or use of iterative reconstruction technique. COMPARISON:  Brain MRI 06/30/2020. Head CT 06/30/2020. FINDINGS: Brain: Cerebral volume is normal. There is no acute intracranial hemorrhage. No demarcated cortical infarct. No extra-axial fluid collection. No evidence of an intracranial mass. No midline shift. Vascular: No hyperdense vessel. Atherosclerotic calcifications. Skull: No fracture or aggressive osseous lesion. Sinuses/Orbits: No mass or acute finding within the imaged orbits. No significant paranasal sinus disease at the imaged levels. ASPECTS ([place] Stroke Program Early CT Score) - Ganglionic level infarction (caudate, lentiform nuclei, internal capsule, insula, M1-M3 cortex): 7 - Supraganglionic infarction (M4-M6 cortex): 3 Total score (0-10 with 10  being normal): 10 These results were communicated to Dr. Quinn Axe at 2:20 [place] 12/8/2023by text page via the [facility] messaging system. IMPRESSION: No evidence of acute intracranial abnormality. ASPECTS is 10. Electronically Signed   By: Kellie Simmering D.O.   On: 11/08/2022 14:20    Procedures Procedures    Medications Ordered in ED Medications  acetaminophen (TYLENOL) tablet 1,000 mg (1,000 mg Oral Given 11/08/22 1519)  LORazepam (ATIVAN) tablet 0.5 mg (0.5 mg Oral Given 11/08/22 1518)    ED Course/ Medical Decision Making/ A&P                           Medical Decision Making Risk OTC drugs. Prescription drug management.   DW[facility] FENNEL is here with strokelike symptoms.  Code stroke initiated in triage.  Neurology evaluated the patient prior to my evaluation.  Patient with unremarkable vitals.  No fever.  Supposedly just prior to arrival she has some stuttering speech, facial droop.  However on exam does not appear to have any focal symptoms.  She has a lot of tremors and she states that she is under a lot of stress with her husband who has PTSD.  She has a headache.  Denies any weakness or numbness currently.  She is already had head CT and per radiology report there is no acute findings.  Per neurology, Dr. Quinn Axe, patient can have MRI and if unremarkable can be discharged.  Suspicion that differential diagnosis likely includes functional process, complex migraine, less likely stroke.  Is not a candidate for tPA.  No concern for LVO.  She denies any chest pain or shortness of breath.  No infectious symptoms.  CBC, BMP have been ordered.  Will give her Tylenol and Ativan for anxiety and headache.  Patient is pending MRI.  EKG per my review and interpretation shows sinus rhythm.  No ischemic changes.  Per my review interpretation of labs shows no significant anemia, electrolyte abnormality or kidney injury.  No leukocytosis.  Patient was handed off to oncoming ED staff with patient pending MRI and  reevaluation.  Anticipate discharge to home.  Importantly she also denies any SI or HI.  This chart was dictated using voice recognition software.  Despite best efforts to proofread,  errors can occur which can change the documentation meaning.         Final Clinical Impression(s) / ED  Diagnoses Final diagnoses:  Stroke-like symptom  Nonintractable headache, unspecified chronicity pattern, unspecified headache type    Rx / DC Orders ED Discharge Orders     None         Lennice Sites, DO 11/08/22 1531

## 2022-11-08 NOTE — ED Notes (Signed)
DC instructions reviewed with pt. PT verbalized understanding.  Pt Dc

## 2022-11-08 NOTE — Consult Note (Signed)
Neurology Consultation  Reason for Consult: Right-sided facial droop Referring Physician:   CC: Right-sided facial droop  History is obtained from: Patient and chart  HPI: Paula [name] is a 61 y.o. female with history of psychogenic movement disorder, depression, hypertension, hyperlipidemia and migraines who presents after going to her eye doctor for follow-up for her cataract surgery today and having sudden onset headache, diplopia and right-sided facial droop.  Stuttering speech pattern is also noted, but this improves with distraction.  Patient has been seen previously multiple times for psychogenic movement disorder which typically includes a tremor, disconjugate gaze, eye movements and scanning speech. Diplopia is fluctuating and monocular on L. Facial droop only apparent when being directly examined.   LKW: 1230 TNK given?: no, too mild to treat and likely not due to stroke IR Thrombectomy? No, no LVO Modified Rankin Scale: 1-No significant post stroke disability and can perform usual duties with stroke symptoms  ROS: A complete ROS was performed and is negative except as noted in the HPI.   Past Medical History:  Diagnosis Date   Anxiety    pt stated anxiety episodes resemble stroke symptoms   Chronic lower back pain    Conversion disorder    Depression    Diverticulitis    Expressive aphasia 11/15/2016   Fibromyalgia    GERD (gastroesophageal reflux disease)    Heart murmur    High cholesterol    History of gastric ulcer    History of hiatal hernia    History of kidney stones    History of thyroid nodule    Hyperlipidemia    Hypertension    IBS (irritable bowel syndrome)    Intermittent vertigo    Migraine    "a few times/year now; maybe" (11/15/2016)   Obesity    OSA on CPAP    wears CPAP nightly ;study in epic 02-01-2014 (11/15/2016)   Osteoarthritis    "knees, hands, back, hips" (11/15/2016)   Pneumonia X 1   hx of   Psychogenic tremor    intermittant  head tremor-(11/15/2016)   Refusal of blood transfusions as patient is Jehovah's Witness    RLS (restless legs syndrome)    Sleep apnea    CPAP   Urinary incontinence    Vertigo      Family History  Problem Relation Age of Onset   Hyperlipidemia Mother    Hypertension Mother    Sleep apnea Mother    Osteoporosis Mother    Hypertension Father    Heart disease Father    Syncope episode Father    Colon cancer Father    Atrial fibrillation Father    Hypertension Sister    Sleep apnea Sister    Sleep apnea Brother    Hypertension Brother    Colon cancer Maternal Aunt    Lung cancer Maternal Aunt    Hypertension Maternal Grandmother    CVA Maternal Grandmother    Pancreatic cancer Maternal Grandfather    Colon cancer Maternal Grandfather    Hypertension Maternal Grandfather    Lung cancer Maternal Grandfather    Throat cancer Maternal Grandfather    Colon cancer Paternal Grandfather    Esophageal cancer Neg Hx    Liver cancer Neg Hx    Stomach cancer Neg Hx      Social History:   reports that she quit smoking about 14 years ago. Her smoking use included cigarettes. She has a 12.50 pack-year smoking history. She has never used smokeless tobacco. She reports that  she does not currently use alcohol. She reports that she does not use drugs.  Medications  Current Facility-Administered Medications:    0.9 %  sodium chloride infusion, 500 mL, Intravenous, Once, Irene Shipper, MD  Current Outpatient Medications:    acetaminophen (TYLENOL) 500 MG tablet, Take 1,000 mg by mouth every 6 (six) hours as needed for headache (pain)., Disp: , Rfl:    albuterol (VENTOLIN HFA) 108 (90 Base) MCG/ACT inhaler, 2 puffs every 6 (six) hours as needed for shortness of breath or wheezing., Disp: , Rfl:    amLODipine (NORVASC) 5 MG tablet, Take 5 mg by mouth daily., Disp: , Rfl:    benzonatate (TESSALON) 100 MG capsule, Take 1 capsule (100 mg total) by mouth every 8 (eight) hours., Disp: 21  capsule, Rfl: 0   Cholecalciferol (VITAMIN D-1000 MAX ST) 25 MCG (1000 UT) tablet, Take by mouth., Disp: , Rfl:    Cyanocobalamin (VITAMIN B 12) 500 MCG TABS, 1 tablet, Disp: , Rfl:    cyclobenzaprine (FLEXERIL) 10 MG tablet, Take 1 tablet by mouth at bedtime as needed for muscle spasms., Disp: , Rfl:    dexlansoprazole (DEXILANT) 60 MG capsule, Take 60 mg by mouth at bedtime., Disp: , Rfl:    dicyclomine (BENTYL) 20 MG tablet, Take 1 tablet (20 mg total) by mouth 2 (two) times daily., Disp: 60 tablet, Rfl: 11   DILT-XR 120 MG 24 hr capsule, Take by mouth., Disp: , Rfl:    escitalopram (LEXAPRO) 10 MG tablet, daily., Disp: , Rfl:    Evolocumab (REPATHA SURECLICK) 631 MG/ML SOAJ, Inject 1 Dose into the skin every 14 (fourteen) days. (Patient taking differently: Inject 140 mg into the skin every 14 (fourteen) days. Every other Tuesday), Disp: 2 mL, Rfl: 11   ezetimibe (ZETIA) 10 MG tablet, Take 1 tablet (10 mg total) by mouth daily., Disp: 90 tablet, Rfl: 3   famotidine (PEPCID) 20 MG tablet, TAKE 1 TABLET AT BEDTIME (Patient taking differently: Take 20 mg by mouth at bedtime.), Disp: 90 tablet, Rfl: 1   fluticasone (FLONASE) 50 MCG/ACT nasal spray, Place 2 sprays into both nostrils as needed., Disp: , Rfl:    ketorolac (ACULAR) 0.5 % ophthalmic solution, SMARTSIG:In Eye(s), Disp: , Rfl:    moxifloxacin (VIGAMOX) 0.5 % ophthalmic solution, Apply to eye., Disp: , Rfl:    pantoprazole (PROTONIX) 40 MG tablet, Take by mouth., Disp: , Rfl:    ranolazine (RANEXA) 500 MG 12 hr tablet, Take 1 tablet (500 mg total) by mouth 2 (two) times daily. (Patient taking differently: Take 500 mg by mouth daily.), Disp: 180 tablet, Rfl: 3   SYMBICORT 160-4.5 MCG/ACT inhaler, as needed., Disp: , Rfl:    Vitamin E 670 MG (1000 UT) CAPS, Take by mouth., Disp: , Rfl:    Exam: Current vital signs: BP (!) 158/76   Pulse 63   Resp 17   Wt 116.4 kg   SpO2 100%   BMI 44.05 kg/m  Vital signs in last 24 hours: Pulse  Rate:  [63-66] 63 (12/08 1430) Resp:  [17-20] 17 (12/08 1430) BP: (152-171)/(76-107) 158/76 (12/08 1430) SpO2:  [98 %-100 %] 100 % (12/08 1430) Weight:  [116.4 kg] 116.4 kg (12/08 1400)  GENERAL: Awake, alert, anxious appearing Psych: Affect appropriate for situation, patient is anxious but cooperative with examination Head: Normocephalic and atraumatic, with intermittent right-sided facial droop EENT: Normal conjunctivae, dry mucous membranes, no OP obstruction LUNGS: Normal respiratory effort. Non-labored breathing on room air CV: Regular rate and rhythm  on telemetry Extremities: warm, well perfused, without obvious deformity  NEURO:  Mental Status: Awake, alert, and oriented to person, place, time, and situation. She is able to provide a clear and coherent history of present illness. Speech/Language: Stuttering pattern of speech noted, but this is distractible.   Naming, repetition, fluency, and comprehension intact without aphasia  No neglect is noted Cranial Nerves:  II: Right pupil 3 mm and briskly reactive, left pupil 5 mm and and briskly reactive (patient has just had dilated eye exam on the left side) visual fields full.  III, IV, VI: EOMI. Lid elevation symmetric and full.  V: Sensation is intact to light touch and symmetrical to face. Blinks to threat. Moves jaw back and forth.  VII: Intermittent right-sided facial droop able to raise eyebrows.  VIII: Hearing intact to voice IX, X: Phonation normal.  XI: Normal sternocleidomastoid and trapezius muscle strength XII: Tongue protrudes midline without fasciculations.   Motor: 5/5 strength is all muscle groups.  Tone is normal. Bulk is normal. Bobbing of RLE when formally tested. Sensation: Intact to light touch bilaterally in all four extremities. No extinction to DSS present.  Coordination: FTN intact bilaterally. HKS intact bilaterally. No pronator drift but drift noted in the right lower extremity Gait:  Deferred  NIHSS: 1a Level of Conscious.: 0 1b LOC Questions: 0 1c LOC Commands: 0 2 Best Gaze: 0 3 Visual: 0 4 Facial Palsy: 1 5a Motor Arm - left: 0 5b Motor Arm - Right: 0 6a Motor Leg - Left: 0 6b Motor Leg - Right: 1 7 Limb Ataxia: 0 8 Sensory: 0 9 Best Language: 0 10 Dysarthria: 0 11 Extinct. and Inatten.: 0 TOTAL: 2   Labs I have reviewed labs in epic and the results pertinent to this consultation are:   CBC    Component Value Date/Time   WBC 9.2 11/08/2022 1400   RBC 4.41 11/08/2022 1400   HGB 13.3 11/08/2022 1434   HCT 39.0 11/08/2022 1434   PLT 353 11/08/2022 1400   MCV 89.1 11/08/2022 1400   MCH 29.7 11/08/2022 1400   MCHC 33.3 11/08/2022 1400   RDW 12.1 11/08/2022 1400   LYMPHSABS 4.3 (H) 11/08/2022 1400   MONOABS 0.6 11/08/2022 1400   EOSABS 0.3 11/08/2022 1400   BASOSABS 0.1 11/08/2022 1400    CMP     Component Value Date/Time   NA 141 11/08/2022 1434   NA 139 12/10/2021 1058   K 4.1 11/08/2022 1434   CL 107 11/08/2022 1434   CO2 26 06/20/2022 0218   GLUCOSE 92 11/08/2022 1434   BUN 11 11/08/2022 1434   BUN 11 12/10/2021 1058   CREATININE 0.80 11/08/2022 1434   CALCIUM 10.2 06/20/2022 0218   PROT 7.5 06/20/2022 0218   PROT 7.4 08/12/2018 1143   ALBUMIN 4.6 06/20/2022 0218   ALBUMIN 4.9 08/12/2018 1143   AST 22 06/20/2022 0218   ALT 27 06/20/2022 0218   ALKPHOS 72 06/20/2022 0218   BILITOT 0.4 06/20/2022 0218   BILITOT 0.4 08/12/2018 1143   GFRNONAA >60 06/20/2022 0218   GFRAA >60 07/01/2020 0206    Lipid Panel     Component Value Date/Time   CHOL 281 (H) 07/01/2020 0206   TRIG 175 (H) 07/01/2020 0206   HDL 44 07/01/2020 0206   CHOLHDL 6.4 07/01/2020 0206   VLDL 35 07/01/2020 0206   LDLCALC 202 (H) 07/01/2020 0206   LDLDIRECT 170.1 01/08/2010 0943     Imaging I have reviewed the images obtained:  CT-scan of the brain: No acute abnormality  MRI examination of the brain: Pending  Assessment: Patient with a history of  psychogenic movement disorder, migraines, hypertension, hyperlipidemia and depression presents after having a sudden onset headache with right-sided facial droop, diplopia and tremor while seeing her eye doctor today.  Facial droop is noted to be intermittent, stuttering speech pattern is noted, but this is distractible.  Patient does complain of diplopia, but this is present with 1 eye closed.  She has been seen multiple times in the past for psychogenic movement disorder which sometimes has mimicked stroke symptoms.  Will obtain brain MRI as patient does have some stroke risk factors.  Impression: Likely episode of psychogenic movement disorder  Recommendations: -Brain MRI -Neurochecks every 2 hours -Okay to discharge if brain MRI negative for acute stroke  Pt seen by NP/Neuro and later by MD. Note/plan to be edited by MD as needed.  [place] , MSN, AGACNP-BC [facility] See Amion for schedule and pager information 11/08/2022 2:40 PM    Attending Neurohospitalist Addendum Patient seen and examined with APP/Resident. Agree with the history and physical as documented above. Agree with the plan as documented, which I helped formulate. I have edited the note above to reflect my full findings and recommendations. I have independently reviewed the chart, obtained history, review of systems and examined the patient.I have personally reviewed pertinent head/neck/spine imaging (CT/MRI). Please feel free to call with any questions.  -- Su Monks, MD [facility] [phone]  If 7pm- 7am, please page neurology on call as listed in [place].

## 2022-11-08 NOTE — ED Notes (Signed)
Patient transported to MRI 

## 2022-11-08 NOTE — Code Documentation (Signed)
Stroke Response Nurse Documentation Code Documentation  Paula [name] is a 61 y.o. female arriving to [facility]  via [employer] on 11/08/22 with past medical hx of GERD, hypertension, depression, anxiety, OSA on CPAP, History of thyroid cyst, migraines, and obesity. On No antithrombotic. Code stroke was activated by ED.   Patient at her ophthalmologist office for a follow-up to her cataract surgery in October when she had a sudden onset of double vision in both eyes and facial droop at 1230. Pt's spouse took her to [facility] by POV and she was seen in triage where the EDP activated the code stroke.    Labs drawn and patient cleared for CT by Dr. Ronnald Nian. Patient to CT where stroke team met her. NIHSS 2, see documentation for details and code stroke times. Patient with right facial droop and right leg weakness on exam. The following imaging was completed:  CT Head. Patient is not a candidate for IV Thrombolytic due to no stroke suspected. Symptoms appear psychogenic/functional in nature. Patient is not a candidate for IR due to no stroke suspected.   Care Plan: MRI, cancel code stroke, discharge home per MD.   Bedside handoff with ED RN.    Margarette Asal  Stroke Response RN [phone]

## 2022-11-08 NOTE — ED Provider Notes (Signed)
Patient seen by stroke team and Dr. Ronnald Nian.  Plan was to follow-up on the MRI.  The MRI did not show any signs of acute abnormality.  They did note some degenerative changes at C3 and C4 that could be causing moderate spinal canal stenosis.  Patient is stable for discharge.  Outpatient follow-up.  Patient requested ambulatory referral to neurology   Dorie Rank, MD 11/08/22 [phone]

## 2022-11-08 NOTE — ED Triage Notes (Signed)
Patient sent to ED from eye doctor for evaluation of sudden onset of right sided facial droop that started at 76 while at a follow-up appointment for cataract surgery. Patient has no arm drift, no leg drift, sensation equal in face, legs, and arms.

## 2022-11-08 NOTE — Discharge Instructions (Addendum)
The MRI today fortunately did not show any sign of stroke or other acute abnormality.  Follow-up with your primary doctor and I have also placed a referral to [facility] neurology.

## 2022-11-15 DIAGNOSIS — Z4689 Encounter for fitting and adjustment of other specified devices: Secondary | ICD-10-CM | POA: Diagnosis not present

## 2022-11-15 DIAGNOSIS — N3946 Mixed incontinence: Secondary | ICD-10-CM | POA: Diagnosis not present

## 2022-11-17 ENCOUNTER — Other Ambulatory Visit: Payer: Self-pay | Admitting: Internal Medicine

## 2022-11-17 DIAGNOSIS — E7849 Other hyperlipidemia: Secondary | ICD-10-CM

## 2022-11-19 ENCOUNTER — Telehealth: Payer: Self-pay | Admitting: Internal Medicine

## 2022-11-19 NOTE — Telephone Encounter (Signed)
Repatha PA submitted via [facility] already on file for this request. Authorization starting on 12/02/2021 and ending on 12/02/2023.

## 2022-12-04 DIAGNOSIS — I10 Essential (primary) hypertension: Secondary | ICD-10-CM | POA: Diagnosis not present

## 2022-12-04 DIAGNOSIS — R7303 Prediabetes: Secondary | ICD-10-CM | POA: Diagnosis not present

## 2022-12-04 DIAGNOSIS — Z6841 Body Mass Index (BMI) 40.0 and over, adult: Secondary | ICD-10-CM | POA: Diagnosis not present

## 2022-12-04 DIAGNOSIS — K580 Irritable bowel syndrome with diarrhea: Secondary | ICD-10-CM | POA: Diagnosis not present

## 2022-12-04 DIAGNOSIS — F419 Anxiety disorder, unspecified: Secondary | ICD-10-CM | POA: Diagnosis not present

## 2022-12-12 DIAGNOSIS — F411 Generalized anxiety disorder: Secondary | ICD-10-CM | POA: Diagnosis not present

## 2022-12-12 DIAGNOSIS — K219 Gastro-esophageal reflux disease without esophagitis: Secondary | ICD-10-CM | POA: Diagnosis not present

## 2022-12-12 DIAGNOSIS — R251 Tremor, unspecified: Secondary | ICD-10-CM | POA: Diagnosis not present

## 2022-12-12 DIAGNOSIS — R7303 Prediabetes: Secondary | ICD-10-CM | POA: Diagnosis not present

## 2022-12-12 DIAGNOSIS — K588 Other irritable bowel syndrome: Secondary | ICD-10-CM | POA: Diagnosis not present

## 2022-12-12 DIAGNOSIS — J4520 Mild intermittent asthma, uncomplicated: Secondary | ICD-10-CM | POA: Diagnosis not present

## 2022-12-12 DIAGNOSIS — I10 Essential (primary) hypertension: Secondary | ICD-10-CM | POA: Diagnosis not present

## 2022-12-12 DIAGNOSIS — E782 Mixed hyperlipidemia: Secondary | ICD-10-CM | POA: Diagnosis not present

## 2022-12-12 DIAGNOSIS — M5030 Other cervical disc degeneration, unspecified cervical region: Secondary | ICD-10-CM | POA: Diagnosis not present

## 2022-12-13 ENCOUNTER — Encounter: Payer: Self-pay | Admitting: Internal Medicine

## 2022-12-16 DIAGNOSIS — M4322 Fusion of spine, cervical region: Secondary | ICD-10-CM | POA: Diagnosis not present

## 2022-12-16 DIAGNOSIS — M4726 Other spondylosis with radiculopathy, lumbar region: Secondary | ICD-10-CM | POA: Diagnosis not present

## 2022-12-18 ENCOUNTER — Other Ambulatory Visit: Payer: Self-pay | Admitting: Orthopaedic Surgery

## 2022-12-18 DIAGNOSIS — M5416 Radiculopathy, lumbar region: Secondary | ICD-10-CM

## 2022-12-18 DIAGNOSIS — G4733 Obstructive sleep apnea (adult) (pediatric): Secondary | ICD-10-CM | POA: Diagnosis not present

## 2022-12-23 ENCOUNTER — Telehealth: Payer: Self-pay | Admitting: Internal Medicine

## 2022-12-23 NOTE — Telephone Encounter (Signed)
Patient called and notified labs are in [place] lipoprofile was not done, as previously ordered. Advised to follow up and Dr. Debara Pickett will advise if other labs are needed.
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
## 2022-12-23 NOTE — Telephone Encounter (Signed)
Patient would like to verify if her lab results were received from her PCP for her lipid clinic f/u appointment 1/25.

## 2022-12-24 ENCOUNTER — Ambulatory Visit (INDEPENDENT_AMBULATORY_CARE_PROVIDER_SITE_OTHER): Payer: Medicare PPO | Admitting: Neurology

## 2022-12-24 ENCOUNTER — Encounter: Payer: Self-pay | Admitting: Neurology

## 2022-12-24 VITALS — BP 174/76 | HR 77 | Ht 64.0 in | Wt 258.0 lb

## 2022-12-24 DIAGNOSIS — F444 Conversion disorder with motor symptom or deficit: Secondary | ICD-10-CM

## 2022-12-24 DIAGNOSIS — I10 Essential (primary) hypertension: Secondary | ICD-10-CM | POA: Diagnosis not present

## 2022-12-24 DIAGNOSIS — F439 Reaction to severe stress, unspecified: Secondary | ICD-10-CM

## 2022-12-24 DIAGNOSIS — R519 Headache, unspecified: Secondary | ICD-10-CM

## 2022-12-24 MED ORDER — AMLODIPINE BESYLATE 5 MG PO TABS: 5.0000 mg | ORAL_TABLET | Freq: Every day | ORAL | 3 refills | Status: DC

## 2022-12-25 ENCOUNTER — Telehealth: Payer: Self-pay | Admitting: Physician Assistant

## 2022-12-25 NOTE — Telephone Encounter (Signed)
Inbound call from patient stating that she had started  Ozempic about 7 weeks ago and has noticed over the last 2 weeks upper abd  pains\cramps. Patient stated she is not sure if its coming from  [facility] or not, but states that the only change she has made as far has her diet goes. Patient is seeking advice if there is anyway she can get a prescription or what recommendations [facility] would have for her. Please advise.

## 2022-12-25 NOTE — Telephone Encounter (Signed)
Returned call to patient. I advised pt to reach out to the prescriber of Ozempic to see if they are OK with her holding this medication for a short period of time to see if her symptoms resolve. I advised pt to call us back if she has continued symptoms despite stopping the Ozempic. Pt verbalized understanding and had no concerns at the end of the call.

## 2022-12-26 ENCOUNTER — Encounter: Payer: Self-pay | Admitting: Internal Medicine

## 2022-12-26 ENCOUNTER — Ambulatory Visit: Payer: Medicare PPO | Attending: Internal Medicine | Admitting: Internal Medicine

## 2022-12-26 VITALS — BP 132/70 | HR 70 | Ht 64.0 in | Wt 251.0 lb

## 2022-12-26 DIAGNOSIS — I2584 Coronary atherosclerosis due to calcified coronary lesion: Secondary | ICD-10-CM

## 2022-12-26 DIAGNOSIS — E7849 Other hyperlipidemia: Secondary | ICD-10-CM

## 2022-12-26 DIAGNOSIS — I2510 Atherosclerotic heart disease of native coronary artery without angina pectoris: Secondary | ICD-10-CM

## 2022-12-26 MED ORDER — EZETIMIBE 10 MG PO TABS: 10.0000 mg | ORAL_TABLET | Freq: Every day | ORAL | 3 refills | Status: DC

## 2022-12-26 NOTE — Progress Notes (Signed)
LIPID CLINIC CONSULT NOTE  Chief Complaint:  Follow-up dyslipidemia  Primary Care Physician: Antony Contras, MD  Primary Cardiologist:  Berniece Salines, DO  HPI:  Paula [name] is a 62 y.o. female who is being seen today for the evaluation of dyslipidemia at the request of Antony Contras, MD.  This is a pleasant 62 year old female kindly referred for evaluation management of dyslipidemia by Dr. Harriet Masson.  She has a history of high cholesterol in the past and unfortunately has been intolerant to statins causing significant myalgias.  She also has a history of obesity, chronic pain and fibromyalgia, family history of heart disease and other medication intolerances.  Recently she underwent CT coronary angiography which demonstrated mild nonobstructive coronary disease with a calcium score of 22, 88th percentile for age and sex matched controls.  She was informed of those results today.  She did have recent lipids however showing a significantly elevated cholesterol with total of 384, triglycerides 136, HDL 53 and LDL 304.  She reports diet that seems to have a significant amount of saturated fats but does not eat fried food regularly however does use a lot of sources of saturated fat and would benefit from dietary modification.  That being said given her severely elevated cholesterol, its likely she has familial hyperlipidemia.  She also reported some palpitations today and an EKG was performed.  This was personally reviewed and indicates sinus rhythm, nonspecific T wave changes at 62.  04/23/2022  Paula [name] returns today for follow-up.  She reports compliance with the Repatha although unfortunately did not get her lipids rechecked.  Her LDL was quite high at 304 and we did send her for genetic testing.  This did show 3 variants of unknown significance including mutations in APO A5, LPL and LP(a).  It is expected that her LP(a) is quite high however the Repatha should reduce that by about 20 to 30%.   Unfortunately she could not tolerate statins.  Ultimately she may need additional therapy.  She has also been describing leg pain when she walks.  Is not always associated with walking a certain distance in fact she can have some pain with sitting in certain positions.  She notes also she gets discomfort that in her legs and buttocks when she has bowel movements.  This is concerning for possible lumbosacral disease or perhaps related to spinal cord impingement.  She denies any specific numbness or anesthesia.  She does see orthopedics and I encouraged her to follow-up with them.  My suspicion for arterial insufficiency is low.  Have coronary CT angiography in January which showed a calcium score of 12, 88th percentile for sex and matched control, again suggesting early onset heart disease however only minimal nonobstructive coronary disease was noted.  08/27/2022  Paula [name] seen today in follow-up.  She has done well on Repatha with marked reduction in her lipids.  When I saw her last however her cholesterol was still little higher than ideal.  As mentioned above she has a genetic dyslipidemia.  I advised adding ezetimibe but this never was started.  Repeat lipids are essentially stable.  LDL particle #1379, LDL-C of 110, HDL-C of 44 and triglycerides 221.  Of concern today, she is mention she has been having some pain in the left side of her neck as well as the left clavicular area.  This has been worsening over the past several weeks to months.  Worse when she lays on her left side.  She is also  noted some swelling there.  She gets some hoarseness with her voice and some drainage and difficulty swallowing.  She also has some left ear pain.  She notes no hearing loss.  She denies any fever, she has no constitutional symptoms, no recent infection.  She has had some dental work and has some bridges but this feels different than typical tooth pain.  She has a history of prior ACDF of C4-C7.  This could represent  a cervical radiculopathy.    12/26/2022  Paula [name] is seen today in follow-up.  She has been dealing with a number of issues with neck and back pain.  This is limited her activity.  She did have repeat cholesterol testing from her primary care provider.  This was a regular lipid profile and demonstrated total cholesterol 200, HDL 51, triglycerides 169 and LDL 119.  This is in the range of previous testing for her.  Her cholesterol still is elevated above target.  We have previously discussed the possibility of additional therapy.  She says she is intent on more weight loss and activity.  She has signed up for a new gym membership.  PMHx:  Past Medical History:  Diagnosis Date   Anxiety    pt stated anxiety episodes resemble stroke symptoms   Chronic lower back pain    Conversion disorder    Depression    Diverticulitis    Expressive aphasia 11/15/2016   Fibromyalgia    GERD (gastroesophageal reflux disease)    Heart murmur    High cholesterol    History of gastric ulcer    History of hiatal hernia    History of kidney stones    History of thyroid nodule    Hyperlipidemia    Hypertension    IBS (irritable bowel syndrome)    Intermittent vertigo    Migraine    "a few times/year now; maybe" (11/15/2016)   Obesity    OSA on CPAP    wears CPAP nightly ;study in epic 02-01-2014 (11/15/2016)   Osteoarthritis    "knees, hands, back, hips" (11/15/2016)   Pneumonia X 1   hx of   Psychogenic tremor    intermittant head tremor-(11/15/2016)   Refusal of blood transfusions as patient is Jehovah's Witness    RLS (restless legs syndrome)    Sleep apnea    CPAP   Urinary incontinence    Vertigo     Past Surgical History:  Procedure Laterality Date   ABDOMINAL HYSTERECTOMY  1990   Partial, still has ovaries   ANTERIOR CERVICAL DECOMP/DISCECTOMY FUSION  02/10/2004   C4 -- C7   BACK SURGERY     CARDIOVASCULAR STRESS TEST  2017   Negative   CESAREAN SECTION  1977; 1982    COLONOSCOPY     CYSTOSCOPY W/ RETROGRADES Right 06/07/2015   Procedure: CYSTOSCOPY WITH RETROGRADE PYELOGRAM;  Surgeon: Ardis Hughs, MD;  Location: [facility];  Service: Urology;  Laterality: Right;   CYSTOSCOPY WITH URETEROSCOPY AND STENT PLACEMENT Right 06/07/2015   Procedure: RIGHT URETEROSCOPY, LASER LITHOTRIPSY, STONE REMOVAL AND RIGHT URETERAL STENT PLACEMENT;  Surgeon: Ardis Hughs, MD;  Location: [facility];  Service: Urology;  Laterality: Right;   DIRECT LARYNGOSCOPY N/A 10/29/2021   Procedure: DIRECT LARYNGOSCOPY WITH BIOPSY;  Surgeon: Melida Quitter, MD;  Location: [place];  Service: ENT;  Laterality: N/A;   HERNIA REPAIR     HOLMIUM LASER APPLICATION N/A 09/25/8526   Procedure: HOLMIUM LASER APPLICATION;  Surgeon: Viona Gilmore  Louis Meckel, MD;  Location: [facility];  Service: Urology;  Laterality: N/A;   LAPAROSCOPIC CHOLECYSTECTOMY  10/07/2000   LIPOMA EXCISION  01/2017   from neck   UMBILICAL HERNIA REPAIR  age 57   UPPER GI ENDOSCOPY      FAMHx:  Family History  Problem Relation Age of Onset   Hyperlipidemia Mother    Hypertension Mother    Sleep apnea Mother    Osteoporosis Mother    Hypertension Father    Heart disease Father    Syncope episode Father    Colon cancer Father    Atrial fibrillation Father    Hypertension Sister    Sleep apnea Sister    Sleep apnea Brother    Hypertension Brother    Colon cancer Maternal Aunt    Lung cancer Maternal Aunt    Hypertension Maternal Grandmother    CVA Maternal Grandmother    Pancreatic cancer Maternal Grandfather    Colon cancer Maternal Grandfather    Hypertension Maternal Grandfather    Lung cancer Maternal Grandfather    Throat cancer Maternal Grandfather    Colon cancer Paternal Grandfather    Esophageal cancer Neg Hx    Liver cancer Neg Hx    Stomach cancer Neg Hx     SOCHx:   reports that she quit smoking about 14 years ago. Her smoking use included  cigarettes. She has a 12.50 pack-year smoking history. She has never used smokeless tobacco. She reports that she does not currently use alcohol. She reports that she does not use drugs.  ALLERGIES:  Allergies  Allergen Reactions   Detrol [Tolterodine] Other (See Comments)    slurred speech, tremors, dizziness   Gabapentin Palpitations and Other (See Comments)    Wt gain, tremor   Aspirin Other (See Comments)    Avoids--- history of gastric ulcers    Contrast Media [Iodinated Contrast Media] Hives, Itching and Rash    CT contrast-Needed to take benadryl    Imitrex [Sumatriptan] Other (See Comments)    Body hurts   Oxycodone Nausea And Vomiting   Milk-Related Compounds Other (See Comments)   Rosuvastatin Other (See Comments)   Vicodin Hp [Hydrocodone-Acetaminophen] Nausea And Vomiting   Avelox [Moxifloxacin] Itching    ROS: Pertinent items noted in HPI and remainder of comprehensive ROS otherwise negative.  HOME MEDS: Current Outpatient Medications on File Prior to Visit  Medication Sig Dispense Refill   acetaminophen (TYLENOL) 500 MG tablet Take 1,000 mg by mouth every 6 (six) hours as needed for headache (pain).     albuterol (VENTOLIN HFA) 108 (90 Base) MCG/ACT inhaler 2 puffs every 6 (six) hours as needed for shortness of breath or wheezing.     amLODipine (NORVASC) 5 MG tablet Take 1 tablet (5 mg total) by mouth daily. 90 tablet 3   benzonatate (TESSALON) 100 MG capsule Take 1 capsule (100 mg total) by mouth every 8 (eight) hours. 21 capsule 0   Cholecalciferol (VITAMIN D-1000 MAX ST) 25 MCG (1000 UT) tablet Take by mouth.     Cyanocobalamin (VITAMIN B 12) 500 MCG TABS 1 tablet     cyclobenzaprine (FLEXERIL) 10 MG tablet Take 1 tablet by mouth at bedtime as needed for muscle spasms.     dexlansoprazole (DEXILANT) 60 MG capsule Take 60 mg by mouth at bedtime.     dicyclomine (BENTYL) 20 MG tablet Take 1 tablet (20 mg total) by mouth 2 (two) times daily. 60 tablet 11    Evolocumab (REPATHA SURECLICK)  140 MG/ML SOAJ INJECT 1ML ('140MG'$ ) INTO THE SKIN EVERY 14 DAYS 2 mL 11   ezetimibe (ZETIA) 10 MG tablet Take 1 tablet (10 mg total) by mouth daily. 90 tablet 3   fluticasone (FLONASE) 50 MCG/ACT nasal spray Place 2 sprays into both nostrils as needed.     pantoprazole (PROTONIX) 40 MG tablet Take by mouth.     Semaglutide,0.25 or 0.'5MG'$ /DOS, (OZEMPIC, 0.25 OR 0.5 MG/DOSE,) 2 MG/1.5ML SOPN Inject 0.5 mg into the skin once a week.     SYMBICORT 160-4.5 MCG/ACT inhaler as needed.     Vitamin E 670 MG (1000 UT) CAPS Take by mouth.     Current Facility-Administered Medications on File Prior to Visit  Medication Dose Route Frequency Provider Last Rate Last Admin   0.9 %  sodium chloride infusion  500 mL Intravenous Once Irene Shipper, MD        LABS/IMAGING: No results found for this or any previous visit (from the past 48 hour(s)). No results found.  LIPID PANEL:    Component Value Date/Time   CHOL 281 (H) 07/01/2020 0206   TRIG 175 (H) 07/01/2020 0206   HDL 44 07/01/2020 0206   CHOLHDL 6.4 07/01/2020 0206   VLDL 35 07/01/2020 0206   LDLCALC 202 (H) 07/01/2020 0206   LDLDIRECT 170.1 01/08/2010 0943    WEIGHTS: Wt Readings from Last 3 Encounters:  12/26/22 251 lb (113.9 kg)  12/24/22 258 lb (117 kg)  11/08/22 256 lb 9.9 oz (116.4 kg)    VITALS: BP 132/70   Pulse 70   Ht '5\' 4"'$  (1.626 m)   Wt 251 lb (113.9 kg)   SpO2 98%   BMI 43.08 kg/m   EXAM: Deferred  EKG: Deferred  ASSESSMENT: Genetically confirmed familial hyperlipidemia, LDL greater than 300, notable mutations in APO A5, LPL and LP(a) Premature coronary disease with a calcium score 75, 88th percentile for age and sex matched controls, minimal nonobstructive disease by CTA (12/2021) Family history of coronary disease Atherogenic diet Morbid obesity Statin intolerance-myalgias Leg pain with walking/lumbosacral pain, worse with defecation Left neck pain  PLAN: 1.   Ms. [name] has  had some challenges with neck and back pain and other issues recently but plans to increase her exercise and activity.  This should help her additionally with her cholesterol.  She reports she has not really been taking the ezetimibe but is interested in starting it.  She had had some side effects on Ozempic and this was after the dose increase.  Hopefully that will improve with the dose decrease or change in regimen.  Once she is feeling better I advised her to start the ezetimibe.  Will plan to repeat lipid in about 6 months and follow-up with me afterwards.  Pixie Casino, MD, [facility], [place] Director of the Advanced Lipid Disorders &  Cardiovascular Risk Reduction Clinic Diplomate of the American Board of Clinical Lipidology Attending Cardiologist  Direct Dial: [phone]  Fax: [phone]  Website:  [email or web address]  Nadean Corwin Toshiye Kever 12/26/2022, 2:57 PM

## 2022-12-26 NOTE — Patient Instructions (Signed)
Medication Instructions:  Your physician has recommended you make the following change in your medication:   -Start ezetimibe (zetia) '10mg'$  once daily.  *If you need a refill on your cardiac medications before your next appointment, please call your pharmacy*   Lab Work: Your physician recommends that you return for lab work in: 6 months for FASTING NMR lipid panel (please do this 3-4 days prior to office visit)  If you have labs (blood work) drawn today and your tests are completely normal, you will receive your results only by: [place] (if you have MyChart) OR A paper copy in the mail If you have any lab test that is abnormal or we need to change your treatment, we will call you to review the results.   Follow-Up: At [facility], you and your health needs are our priority.  As part of our continuing mission to provide you with exceptional heart care, we have created designated Provider Care Teams.  These Care Teams include your primary Cardiologist (physician) and Advanced Practice Providers (APPs -  Physician Assistants and Nurse Practitioners) who all work together to provide you with the care you need, when you need it.  We recommend signing up for the patient portal called "MyChart".  Sign up information is provided on this After Visit Summary.  MyChart is used to connect with patients for Virtual Visits (Telemedicine).  Patients are able to view lab/test results, encounter notes, upcoming appointments, etc.  Non-urgent messages can be sent to your provider as well.   To learn more about what you can do with MyChart, go to [email or web address].    Your next appointment:   6 month(s)  Provider:   K. Mali Hilty, MD

## 2022-12-27 ENCOUNTER — Ambulatory Visit: Payer: Medicare HMO | Admitting: Internal Medicine

## 2022-12-29 ENCOUNTER — Other Ambulatory Visit: Payer: Medicare HMO

## 2022-12-30 ENCOUNTER — Ambulatory Visit
Admission: RE | Admit: 2022-12-30 | Discharge: 2022-12-30 | Disposition: A | Discharge status: Home / Self Care | Source: Ambulatory Visit | Attending: Orthopaedic Surgery | Admitting: Orthopaedic Surgery

## 2022-12-30 DIAGNOSIS — M5416 Radiculopathy, lumbar region: Secondary | ICD-10-CM

## 2022-12-30 DIAGNOSIS — M5136 Other intervertebral disc degeneration, lumbar region: Secondary | ICD-10-CM | POA: Diagnosis not present

## 2023-01-06 ENCOUNTER — Encounter: Payer: Self-pay | Admitting: Family Medicine

## 2023-01-06 DIAGNOSIS — Z1231 Encounter for screening mammogram for malignant neoplasm of breast: Secondary | ICD-10-CM

## 2023-01-10 ENCOUNTER — Other Ambulatory Visit: Payer: Self-pay | Admitting: Family Medicine

## 2023-01-10 DIAGNOSIS — N644 Mastodynia: Secondary | ICD-10-CM

## 2023-01-14 ENCOUNTER — Ambulatory Visit (INDEPENDENT_AMBULATORY_CARE_PROVIDER_SITE_OTHER): Payer: Medicare PPO | Admitting: Pulmonary Disease

## 2023-01-14 ENCOUNTER — Encounter (HOSPITAL_BASED_OUTPATIENT_CLINIC_OR_DEPARTMENT_OTHER): Payer: Self-pay | Admitting: Pulmonary Disease

## 2023-01-14 VITALS — BP 128/82 | HR 70 | Temp 98.4°F | Ht 64.0 in | Wt 256.1 lb

## 2023-01-14 DIAGNOSIS — R053 Chronic cough: Secondary | ICD-10-CM | POA: Diagnosis not present

## 2023-01-14 DIAGNOSIS — G4733 Obstructive sleep apnea (adult) (pediatric): Secondary | ICD-10-CM | POA: Diagnosis not present

## 2023-01-14 NOTE — Assessment & Plan Note (Signed)
Attributed to postnasal drip and GERD.  Currently in remission. She will continue Dexilant.  I have asked her to take Claritin or Zyrtec during the allergy season. Due to her persistent dysphagia at the pharyngeal level, we will proceed with modified barium swallow with speech therapist present.  She may need to be educated on some maneuvers to prevent this from happening

## 2023-01-14 NOTE — Assessment & Plan Note (Signed)
CPAP download was reviewed which shows excellent control of events on auto settings 5 to 12 cm with average pressure of 11.4 max pressure of 11.8 cm. She is very compliant.  There is a mild leak but doubt that is of much significance.  Her husband states that she sometimes snores through the machine.  If this persist we can increase lower limit of auto CPAP to 8 cm  Weight loss encouraged, compliance with goal of at least 4-6 hrs every night is the expectation. Advised against medications with sedative side effects Cautioned against driving when sleepy - understanding that sleepiness will vary on a day to day basis

## 2023-01-14 NOTE — Progress Notes (Signed)
   Subjective:    Patient ID: Melchor Amour, female    DOB: 1960-12-10, 62 y.o.   MRN: [id number]  HPI  62 yo woman for follow-up of OSA & chronic cough attributed to postnasal drip versus GERD    PMH: Thyroid nodule, anxiety, obesity, HTN, GERD, DM, TIA, Dyslipidemia, Fibromyalgia - Former Smoker. Quit 2009. 12.5 pack year   Chief Complaint  Patient presents with   Follow-up    Pt states she still becomes SOB at times.    I last saw her in 2019, reviewed last APP visit from 08/2022. Chronic cough is now under remission mostly and occasionally flares up.  She remains on Dexilant for reflux.  She is having anxiety and eating issues and is planning to see a mental health professional. She complains of occasional wheezing especially in the mornings.  She has occasionally had to use either albuterol Symbicort inhaler.  She mostly uses this only on as-needed basis She reports choking episodes when swallowing -happens with both liquids and solids.  This seems to happen almost every day   Significant tests/ events reviewed  PSG 2002 >> CPAP 9cm with a nasal mask PSG 01/2014  - AHI 8/h, RDI 2/h , lowest 79% Placed on autoCPAP with pillows ([facility]oc[facility]) >> avg pr 11 cm      PFTs 11/2015  FEV1  at 99%, ratio 92, no significant bronchodilator response, FVC 85%. DLCO 82%. Spirometry 04/2018  moderate restriction with FEV1 at 71%, ratio 89, FVC 63%.   UGI series 10/2015 >>Esophageal dysmotility with distal barium stasis, likely secondary to reflux. Reflux to the lower esophagus was documented   CT chest 04/2018 showed clear lungs negative for PE.  FENO 9 ppb .    Review of Systems neg for any significant sore throat, dysphagia, itching, sneezing, nasal congestion or excess/ purulent secretions, fever, chills, sweats, unintended wt loss, pleuritic or exertional cp, hempoptysis, orthopnea pnd or change in chronic leg swelling. Also denies presyncope, palpitations, heartburn, abdominal pain,  nausea, vomiting, diarrhea or change in bowel or urinary habits, dysuria,hematuria, rash, arthralgias, visual complaints, headache, numbness weakness or ataxia.     Objective:   Physical Exam  Gen. Pleasant, obese, in no distress ENT - no lesions, no post nasal drip Neck: No JVD, no thyromegaly, no carotid bruits Lungs: no use of accessory muscles, no dullness to percussion, decreased without rales or rhonchi  Cardiovascular: Rhythm regular, heart sounds  normal, no murmurs or gallops, no peripheral edema Musculoskeletal: No deformities, no cyanosis or clubbing , no tremors       Assessment & Plan:

## 2023-01-14 NOTE — Patient Instructions (Addendum)
  CPAP is working well except for mild leak  x modified barium swallow with speech therapist present -to check on your swallowing issues

## 2023-01-15 ENCOUNTER — Ambulatory Visit: Payer: Medicare PPO | Admitting: Obstetrics and Gynecology

## 2023-01-15 ENCOUNTER — Encounter: Payer: Self-pay | Admitting: Obstetrics and Gynecology

## 2023-01-15 VITALS — BP 133/84 | HR 64 | Ht 63.75 in | Wt 256.0 lb

## 2023-01-15 DIAGNOSIS — R350 Frequency of micturition: Secondary | ICD-10-CM | POA: Diagnosis not present

## 2023-01-15 DIAGNOSIS — N393 Stress incontinence (female) (male): Secondary | ICD-10-CM

## 2023-01-15 DIAGNOSIS — N3281 Overactive bladder: Secondary | ICD-10-CM

## 2023-01-15 LAB — POCT URINALYSIS DIPSTICK
Blood, UA: NEGATIVE
Glucose, UA: NEGATIVE
Ketones, UA: NEGATIVE
Leukocytes, UA: NEGATIVE
Nitrite, UA: NEGATIVE
Protein, UA: POSITIVE — AB
Spec Grav, UA: >=1.03 — AB (ref 1.010–1.025)
Urobilinogen, UA: 0.2 E.U./dL
pH, UA: 5.5 (ref 5.0–8.0)

## 2023-01-15 MED ORDER — DIAZEPAM 5 MG PO TABS: ORAL_TABLET | ORAL | 0 refills | Status: DC

## 2023-01-15 MED ORDER — MIRABEGRON ER 25 MG PO TB24: 25.0000 mg | ORAL_TABLET | Freq: Every day | ORAL | 5 refills | Status: DC

## 2023-01-15 MED ORDER — SULFAMETHOXAZOLE-TRIMETHOPRIM 800-160 MG PO TABS: 1.0000 | ORAL_TABLET | Freq: Two times a day (BID) | ORAL | 0 refills | Status: AC

## 2023-01-15 NOTE — Patient Instructions (Addendum)
For treatment of stress urinary incontinence, which is leakage with physical activity/movement/strainging/coughing, we discussed expectant management versus nonsurgical options versus surgery. Nonsurgical options include weight loss, physical therapy, as well as a pessary.  Surgical options include a midurethral sling, which is a synthetic mesh sling that acts like a hammock under the urethra to prevent leakage of urine, and transurethral injection of a bulking agent.  We discussed the symptoms of overactive bladder (OAB), which include urinary urgency, urinary frequency, night-time urination, with or without urge incontinence.  We discussed management including behavioral therapy (decreasing bladder irritants by following a bladder diet, urge suppression strategies, timed voids, bladder retraining), physical therapy, medication; and for refractory cases posterior tibial nerve stimulation, sacral neuromodulation, and intravesical botulinum toxin injection.   For Beta-3 agonist medication, we discussed the potential side effect of elevated blood pressure which is more likely to occur in individuals with uncontrolled hypertension. You were given a prescription for Myrbetriq 25 mg.  It can take a month to start working so give it time, but if you have bothersome side effects call sooner and we can try a different medication.  Call us if you have trouble filling the prescription or if it's not covered by your insurance.
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
## 2023-01-16 ENCOUNTER — Other Ambulatory Visit: Payer: Self-pay

## 2023-01-16 ENCOUNTER — Emergency Department (HOSPITAL_BASED_OUTPATIENT_CLINIC_OR_DEPARTMENT_OTHER)
Admission: EM | Admit: 2023-01-16 | Discharge: 2023-01-16 | Discharge status: Against Medical Advice | Payer: Medicare PPO | Attending: Emergency Medicine | Admitting: Emergency Medicine

## 2023-01-16 ENCOUNTER — Ambulatory Visit (HOSPITAL_BASED_OUTPATIENT_CLINIC_OR_DEPARTMENT_OTHER): Payer: Medicare PPO | Attending: Orthopedic Surgery | Admitting: Physical Therapy

## 2023-01-16 ENCOUNTER — Encounter (HOSPITAL_BASED_OUTPATIENT_CLINIC_OR_DEPARTMENT_OTHER): Payer: Self-pay | Admitting: Emergency Medicine

## 2023-01-16 ENCOUNTER — Other Ambulatory Visit (HOSPITAL_COMMUNITY): Payer: Self-pay

## 2023-01-16 DIAGNOSIS — M6281 Muscle weakness (generalized): Secondary | ICD-10-CM

## 2023-01-16 DIAGNOSIS — M25512 Pain in left shoulder: Secondary | ICD-10-CM | POA: Insufficient documentation

## 2023-01-16 DIAGNOSIS — R262 Difficulty in walking, not elsewhere classified: Secondary | ICD-10-CM

## 2023-01-16 DIAGNOSIS — F410 Panic disorder [episodic paroxysmal anxiety] without agoraphobia: Secondary | ICD-10-CM | POA: Diagnosis not present

## 2023-01-16 DIAGNOSIS — Z5321 Procedure and treatment not carried out due to patient leaving prior to being seen by health care provider: Secondary | ICD-10-CM | POA: Diagnosis not present

## 2023-01-16 DIAGNOSIS — M5459 Other low back pain: Secondary | ICD-10-CM

## 2023-01-16 DIAGNOSIS — R0602 Shortness of breath: Secondary | ICD-10-CM | POA: Diagnosis not present

## 2023-01-16 DIAGNOSIS — R1310 Dysphagia, unspecified: Secondary | ICD-10-CM

## 2023-01-16 NOTE — Progress Notes (Signed)
Pt's husband said she was having an anxiety attack and usually has 1 a month. RT listened to her breath sound and they were clear. The husband stated that with her panic attacks she usually feels like her throat is closing. RT will continue to monitor

## 2023-01-16 NOTE — ED Notes (Signed)
RN has spoken with pt 2 x , assured her MD will be with her. Pt agreed to stay earlier, now will not stay and is leaving .

## 2023-01-16 NOTE — ED Notes (Signed)
RN assessing patient, patient presents with labored breathing, she is out of breath trying to talk. States she has these episodes for years but they are increasing in frequency. She has been worked up for stroke while  having one in past and was negative.pt has intermittent facial droop on right, states the left side of her head is hurting and her left shoulder. Pt is alert and oriented x4. Equal grips.

## 2023-01-16 NOTE — Therapy (Signed)
OUTPATIENT PHYSICAL THERAPY THORACOLUMBAR EVALUATION   Patient Name: Paula [name] MRN: [id number] DOB:[birth year], 62 y.o., female Today's Date: 01/17/2023  END OF SESSION:  PT End of Session - 01/17/23 1242     Visit Number 1    Number of Visits 8    Date for PT Re-Evaluation 02/13/23    Authorization Type [employer]    PT Start Time 1205    PT Stop Time 1255    PT Time Calculation (min) 50 min    Activity Tolerance Treatment limited secondary to medical complications (Comment)   Pt has a conversion episode halting session   Behavior During Therapy [facility] for tasks assessed/performed;Anxious             Past Medical History:  Diagnosis Date   Anxiety    pt stated anxiety episodes resemble stroke symptoms   Chronic lower back pain    Conversion disorder    Depression    Diverticulitis    Expressive aphasia 11/15/2016   Fibromyalgia    GERD (gastroesophageal reflux disease)    Heart murmur    High cholesterol    History of gastric ulcer    History of hiatal hernia    History of kidney stones    History of thyroid nodule    Hyperlipidemia    Hypertension    IBS (irritable bowel syndrome)    Intermittent vertigo    Migraine    "a few times/year now; maybe" (11/15/2016)   Obesity    OSA on CPAP    wears CPAP nightly ;study in epic 02-01-2014 (11/15/2016)   Osteoarthritis    "knees, hands, back, hips" (11/15/2016)   Pneumonia X 1   hx of   Psychogenic tremor    intermittant head tremor-(11/15/2016)   Refusal of blood transfusions as patient is Jehovah's Witness    RLS (restless legs syndrome)    Sleep apnea    CPAP   Urinary incontinence    Vertigo    Past Surgical History:  Procedure Laterality Date   ABDOMINAL HYSTERECTOMY  1990   Partial, still has ovaries   ANTERIOR CERVICAL DECOMP/DISCECTOMY FUSION  02/10/2004   C4 -- C7   BACK SURGERY     CARDIOVASCULAR STRESS TEST  2017   Negative   CESAREAN SECTION  1977; 1982   COLONOSCOPY     CYSTOSCOPY  W/ RETROGRADES Right 06/07/2015   Procedure: CYSTOSCOPY WITH RETROGRADE PYELOGRAM;  Surgeon: Ardis Hughs, MD;  Location: [facility];  Service: Urology;  Laterality: Right;   CYSTOSCOPY WITH URETEROSCOPY AND STENT PLACEMENT Right 06/07/2015   Procedure: RIGHT URETEROSCOPY, LASER LITHOTRIPSY, STONE REMOVAL AND RIGHT URETERAL STENT PLACEMENT;  Surgeon: Ardis Hughs, MD;  Location: [facility];  Service: Urology;  Laterality: Right;   DIRECT LARYNGOSCOPY N/A 10/29/2021   Procedure: DIRECT LARYNGOSCOPY WITH BIOPSY;  Surgeon: Melida Quitter, MD;  Location: [place];  Service: ENT;  Laterality: N/A;   HERNIA REPAIR     HOLMIUM LASER APPLICATION N/A [id number]   Procedure: HOLMIUM LASER APPLICATION;  Surgeon: Ardis Hughs, MD;  Location: [facility];  Service: Urology;  Laterality: N/A;   LAPAROSCOPIC CHOLECYSTECTOMY  10/07/2000   LIPOMA EXCISION  01/2017   from neck   UMBILICAL HERNIA REPAIR  age 89   UPPER GI ENDOSCOPY     Patient Active Problem List   Diagnosis Date Noted   Precordial pain 12/07/2021   Chest pain 12/07/2021   Prediabetes 12/07/2021   Preprocedural examination  12/07/2021   Hyperlipidemia 12/07/2021   Palpitations 12/07/2021   Physical deconditioning 08/22/2021   History of COVID-19 01/04/2021   Adult onset stuttering 06/30/2020   Migraine 06/30/2020   Fibromyalgia 06/30/2020   Type 2 diabetes mellitus without complication ([place]) [id number]   TIA (transient ischemic attack) 06/30/2020   Atypical chest pain 08/13/2018   Depression, major 03/17/2018   Chronic pain syndrome 03/17/2018   Expressive aphasia 11/15/2016   Chronic cough 11/08/2015   B12 deficiency 12/07/2014   Wart viral 11/07/2014   Coarse tremors 02/09/2014   OSA on CPAP 01/28/2014   Functional neurological symptom disorder (conversion disorder), with abnormal movement 01/14/2014   Dyspnea 01/12/2014   Tremor of face and hands 01/12/2014   Breast  pain in female 02/09/2013   Elevated WBC count 10/12/2012   Abdominal pain, other specified site 10/09/2012   Abdominal bloating 10/09/2012   Ovarian cystic mass 05/25/2012   Shoulder pain, right 03/11/2012   Right arm pain 12/30/2011   Acute sinusitis 07/30/2010   ACNE, ROSACEA 07/17/2010   LEG PAIN 07/17/2010   NECK PAIN 05/10/2010   ABDOMINAL PAIN, EPIGASTRIC 05/10/2010   BURSITIS, LEFT HIP 05/01/2010   PLANTAR FASCIITIS, BILATERAL 05/01/2010   HYPERGLYCEMIA 01/05/2010   PRURITUS 11/27/2009   TOBACCO USE, QUIT 11/27/2009   Anxiety state 11/22/2008   FATIGUE 11/22/2008   Essential hypertension 10/07/2008   Insomnia, unspecified 08/30/2008   Obesity 08/19/2008   NAUSEA ALONE 05/02/2008   VERTIGO 01/19/2008   DIARRHEA 01/19/2008   THYROID NODULE 06/23/2007   Dyslipidemia 06/23/2007   Situational depression 06/23/2007   ADD 06/23/2007   TMJ SYNDROME 06/23/2007   GERD 06/23/2007   Irritable bowel syndrome 06/23/2007   Headache 06/23/2007    PCP: Antony Contras, MD   REFERRING PROVIDER: Karenann Cai, PA-C   REFERRING DIAG:  [phone] (ICD-10-CM) - Other spondylosis, cervical region  M47.26 (ICD-10-CM) - Other spondylosis with radiculopathy, lumbar region    Rationale for Evaluation and Treatment: Rehabilitation  THERAPY DIAG:  Other low back pain  Difficulty in walking, not elsewhere classified  Muscle weakness (generalized)  ONSET DATE: >5 yrs  SUBJECTIVE:                                                                                                                                                                                           SUBJECTIVE STATEMENT: It is mostly my back, butt cheeks to heels constantly. Hoping to get some pain relief. Have been coming here ([facility]) and have been able to walk in pool.  PERTINENT HISTORY:  Conversion disorder: looks like stroke more frequent. Triggers are eye movements, certain words.  Occurring  daily to every  other day  PAIN:  Are you having pain? Yes: NPRS scale: current 7/10  worst 8.5/10 least 4/10 Pain location: LB, hips buttocks to heel Pain description: pins/needles, numb, ache Aggravating factors: sitting on commode; sitting Relieving factors: lying on stomach  PRECAUTIONS: Fall  WEIGHT BEARING RESTRICTIONS: No  FALLS:  Has patient fallen in last 6 months? No  LIVING ENVIRONMENT: Lives with: lives with their family and lives with their spouse Lives in: House/apartment Stairs: Yes: External: 1 steps; none Has following equipment at home: None  OCCUPATION: retired/disability  PLOF: Independent  PATIENT GOALS: decreased pain, walk better,  NEXT MD VISIT: feb 19  OBJECTIVE:   DIAGNOSTIC FINDINGS:    MRI LUMBAR SPINE WITHOUT CONTRAST 12/30/22 IMPRESSION: Stable mild degenerative changes of the lumbar spine without high-grade spinal canal or neural foraminal stenosis at any level.  PATIENT SURVEYS:  FOTO risk adjusted 42% with goal of 25   COGNITION: Overall cognitive status: Within functional limits for tasks assessed     SENSATION: Numbness and tingling/P&N from lb through feet intermittently  MUSCLE LENGTH: Unable to test  POSTURE:   PALPATION:   LUMBAR ROM:   AROM eval  Flexion Just below knees  Extension   Right lateral flexion   Left lateral flexion   Right rotation   Left rotation    (Blank rows = not tested)  LOWER EXTREMITY ROM:     Active  Right eval Left eval  Hip flexion    Hip extension    Hip abduction    Hip adduction    Hip internal rotation    Hip external rotation    Knee flexion    Knee extension    Ankle dorsiflexion    Ankle plantarflexion    Ankle inversion    Ankle eversion     (Blank rows = not tested)  LOWER EXTREMITY MMT:    MMT Right eval Left eval  Hip flexion    Hip extension    Hip abduction    Hip adduction    Hip internal rotation    Hip external rotation    Knee flexion    Knee extension     Ankle dorsiflexion    Ankle plantarflexion    Ankle inversion    Ankle eversion     (Blank rows = not tested)  LUMBAR SPECIAL TESTS:    FUNCTIONAL TESTS:    Unable to test due to conversion episode 5 times sit to stand: TBA Timed up and go (TUG): TBA Berg Balance Scale: TBA  GAIT: Distance walked: 400 Assistive device utilized: none Level of assistance: indep arriving; Hha leaving Comments: During conversion episode HHA of CG (husband), decreased step length with decreased cadence, lack of hip/knee flex just clearing feet from floor  TODAY'S TREATMENT:  Initiated evaluation   PATIENT EDUCATION:  Education details: Discussed eval findings, rehab rationale and POC and patient is in agreement  Person educated: Patient Education method: Explanation Education comprehension: verbalized understanding  HOME EXERCISE PROGRAM: TBA  ASSESSMENT:  CLINICAL IMPRESSION: Patient is a 62 y.o. F who was seen today for physical therapy evaluation and treatment for  spondylosis of cervical region as well as lumbar with radiculopathy. She presents here today initially by herself. She states her pain has been increasing over the past year with worst symptoms being when she is seated on commode, legs go numb. Pt tolerates lumbar ROM objective testing only before  having a conversion episode which limited her ability to tolerate the remainder of the assessment.  Her husband joined her and assisted her out of the building. She was able to complete her FOTO prior to leaving. Plan to continue with assessment as tolerated next session.  Will plan on aquatics vs land based at that time.      OBJECTIVE IMPAIRMENTS: decreased activity tolerance, decreased coordination, decreased endurance, decreased mobility, difficulty walking, and conversion disorder .   ACTIVITY LIMITATIONS:  carrying, lifting, bending, sitting, standing, squatting, sleeping, stairs, and transfers  PARTICIPATION LIMITATIONS: cleaning, driving, shopping, community activity, occupation, and yard work  PERSONAL FACTORS: Time since onset of injury/illness/exacerbation and 1 comorbidity: conversion conversion disorder  are also affecting patient's functional outcome.   REHAB POTENTIAL: Fair    CLINICAL DECISION MAKING: Evolving/moderate complexity  EVALUATION COMPLEXITY: Moderate   GOALS: Goals reviewed with patient? Yes  SHORT TERM GOALS: Target date: 02/13/23  Pt to tolerate full sessions of therapy without increase in pain and without limitations due to conversion disorder Baseline:1st session limited Goal status: INITIAL  2.  Pt will tolerate objective testing and LT goals will be set Baseline: not tolerated Goal status: INITIAL  3.  Pt will report decrease in LB pain by at least 25% improving toleration to activities Baseline:  Goal status: INITIAL  4.  Pt will report decreased numbness of LE with sitting on commode (Modifying equipment:toilet or use of squatty potty) Baseline: numbness Goal status: INITIAL    LONG TERM GOALS: Target date: 03/13/23  Pt to meet stated Foto Goal 53% Baseline: 42% Goal status: INITIAL   PLAN:  PT FREQUENCY: 1-2x/week  PT DURATION: 4 weeks  PLANNED INTERVENTIONS: Therapeutic exercises, Therapeutic activity, Neuromuscular re-education, Balance training, Gait training, Patient/Family education, Self Care, Joint mobilization, Stair training, DME instructions, Aquatic Therapy, Electrical stimulation, Spinal manipulation, Spinal mobilization, Cryotherapy, Moist heat, Traction, Ultrasound, Ionotophoresis 48m/ml Dexamethasone, Manual therapy, and Re-evaluation.  PLAN FOR NEXT SESSION: complete objective assessment   FDenton Meek PT MPT 01/17/2023, 1:04 PM  Referring diagnosis? Spondylosis cervical area and lumbar area with  radiculopathy Treatment diagnosis? (if different than referring diagnosis) same What was this (referring dx) caused by? []$  Surgery []$  Fall [x]$  Ongoing issue []$  Arthritis []$  Other: ____________  Laterality: []$  Rt []$  Lt [x]$  Both  Check all possible CPT codes:  *CHOOSE 10 OR LESS*    [x]$  97110 (Therapeutic Exercise)  []$  92507 (SLP Treatment)  [x]$  97112 (Neuro Re-ed)   []$  92526 (Swallowing Treatment)   [x]$  97116 (Gait Training)   []$  [id number](Cognitive Training, 1st 15 minutes) [x]$  97140 (Manual Therapy)   []$  97130 (Cognitive Training, each add'l 15 minutes)  [x]$  97164 (Re-evaluation)                              []$   Other, List CPT Code ____________  [x]$  [id number] (Therapeutic Activities)     [x]$  97535 (Self Care)   []$  All codes above (97110 - 97535)  []$  97012 (Mechanical Traction)  [x]$  97014 (E-stim Unattended)  [x]$  97032 (E-stim manual)  [x]$  97033 (Ionto)  [x]$  97035 (Ultrasound) []$  97750 (Physical Performance Training) [x]$  [id number] (Aquatic Therapy) []$  97016 (Vasopneumatic Device) []$  [id number] (Paraffin) []$  97034 (Contrast Bath) []$  97597 (Wound Care 1st 20 sq cm) []$  97598 (Wound Care each add'l 20 sq cm) []$  97760 (Orthotic Fabrication, Fitting, Training Initial) []$  [id number] (Prosthetic Management and Training Initial) []$  [id number] (Orthotic or Prosthetic Training/ Modification Subsequent)

## 2023-01-16 NOTE — ED Triage Notes (Signed)
Pt arrives to ED with c/o SOB after panic attack. She was about to be evaluated for water therapy when she suddenly became SOB. She reports also having a panic attack last night.

## 2023-01-16 NOTE — ED Notes (Signed)
MD aware of prior note/assessment

## 2023-01-17 ENCOUNTER — Encounter (HOSPITAL_BASED_OUTPATIENT_CLINIC_OR_DEPARTMENT_OTHER): Payer: Self-pay | Admitting: Physical Therapy

## 2023-01-17 DIAGNOSIS — F444 Conversion disorder with motor symptom or deficit: Secondary | ICD-10-CM | POA: Diagnosis not present

## 2023-01-17 DIAGNOSIS — M542 Cervicalgia: Secondary | ICD-10-CM | POA: Diagnosis not present

## 2023-01-17 DIAGNOSIS — F411 Generalized anxiety disorder: Secondary | ICD-10-CM | POA: Diagnosis not present

## 2023-01-18 DIAGNOSIS — G4733 Obstructive sleep apnea (adult) (pediatric): Secondary | ICD-10-CM | POA: Diagnosis not present

## 2023-01-20 ENCOUNTER — Encounter (HOSPITAL_COMMUNITY): Payer: Self-pay

## 2023-01-20 ENCOUNTER — Encounter: Payer: Self-pay | Admitting: *Deleted

## 2023-01-20 ENCOUNTER — Ambulatory Visit (INDEPENDENT_AMBULATORY_CARE_PROVIDER_SITE_OTHER): Payer: Medicare PPO | Admitting: Clinical

## 2023-01-20 ENCOUNTER — Encounter (HOSPITAL_COMMUNITY): Payer: Self-pay | Admitting: Clinical

## 2023-01-20 DIAGNOSIS — F419 Anxiety disorder, unspecified: Secondary | ICD-10-CM | POA: Diagnosis not present

## 2023-01-20 DIAGNOSIS — F32A Depression, unspecified: Secondary | ICD-10-CM | POA: Diagnosis not present

## 2023-01-20 DIAGNOSIS — F449 Dissociative and conversion disorder, unspecified: Secondary | ICD-10-CM
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
## 2023-01-22 ENCOUNTER — Other Ambulatory Visit: Payer: Self-pay | Admitting: Family Medicine

## 2023-01-22 ENCOUNTER — Ambulatory Visit
Admission: RE | Admit: 2023-01-22 | Discharge: 2023-01-22 | Disposition: A | Discharge status: Home / Self Care | Payer: Medicare PPO | Source: Ambulatory Visit | Attending: Family Medicine | Admitting: Family Medicine

## 2023-01-22 ENCOUNTER — Ambulatory Visit: Payer: Medicare PPO

## 2023-01-22 DIAGNOSIS — N644 Mastodynia: Secondary | ICD-10-CM

## 2023-01-22 NOTE — Progress Notes (Signed)
[employer] provider protal was used to check to check CPT codes for procedure : Urethral Bulking CPT code: [id number], [phone]  NO PA needed for this procedure. Call Ref #: Human request has been scanned in to the chart

## 2023-01-23 ENCOUNTER — Telehealth: Payer: Self-pay | Admitting: Family Medicine

## 2023-01-23 NOTE — Telephone Encounter (Signed)
Patient called and said she has previously seen Dr. Alain Marion and would like to know if he'd be willing to accept her as a patient again. Best callback number is [phone].

## 2023-01-24 ENCOUNTER — Encounter (HOSPITAL_BASED_OUTPATIENT_CLINIC_OR_DEPARTMENT_OTHER): Payer: Self-pay | Admitting: Physical Therapy

## 2023-01-24 ENCOUNTER — Ambulatory Visit (HOSPITAL_BASED_OUTPATIENT_CLINIC_OR_DEPARTMENT_OTHER): Payer: Medicare PPO | Admitting: Physical Therapy

## 2023-01-24 DIAGNOSIS — R262 Difficulty in walking, not elsewhere classified: Secondary | ICD-10-CM

## 2023-01-24 DIAGNOSIS — M6281 Muscle weakness (generalized): Secondary | ICD-10-CM | POA: Diagnosis present

## 2023-01-24 DIAGNOSIS — M5459 Other low back pain: Secondary | ICD-10-CM | POA: Insufficient documentation

## 2023-01-24 NOTE — Therapy (Signed)
OUTPATIENT PHYSICAL THERAPY THORACOLUMBAR EVALUATION   Patient Name: Paula [name] MRN: [id number] DOB:[birth year], 62 y.o., female Today's Date: 01/24/2023  END OF SESSION:  PT End of Session - 01/24/23 0944     Visit Number 2    Number of Visits 8    Date for PT Re-Evaluation 02/13/23    Authorization Type [employer]    Authorization Time Period through 03/02/23    PT Start Time 0900    PT Stop Time 0935    PT Time Calculation (min) 35 min    Activity Tolerance Treatment limited secondary to medical complications (Comment)   Pt has a conversion episode halting session   Behavior During Therapy [facility] for tasks assessed/performed;Anxious             Past Medical History:  Diagnosis Date   Anxiety    pt stated anxiety episodes resemble stroke symptoms   Chronic lower back pain    Conversion disorder    Depression    Diverticulitis    Expressive aphasia 11/15/2016   Fibromyalgia    GERD (gastroesophageal reflux disease)    Heart murmur    High cholesterol    History of gastric ulcer    History of hiatal hernia    History of kidney stones    History of thyroid nodule    Hyperlipidemia    Hypertension    IBS (irritable bowel syndrome)    Intermittent vertigo    Migraine    "a few times/year now; maybe" (11/15/2016)   Obesity    OSA on CPAP    wears CPAP nightly ;study in epic 02-01-2014 (11/15/2016)   Osteoarthritis    "knees, hands, back, hips" (11/15/2016)   Pneumonia X 1   hx of   Psychogenic tremor    intermittant head tremor-(11/15/2016)   Refusal of blood transfusions as patient is Jehovah's Witness    RLS (restless legs syndrome)    Sleep apnea    CPAP   Urinary incontinence    Vertigo    Past Surgical History:  Procedure Laterality Date   ABDOMINAL HYSTERECTOMY  1990   Partial, still has ovaries   ANTERIOR CERVICAL DECOMP/DISCECTOMY FUSION  02/10/2004   C4 -- C7   BACK SURGERY     CARDIOVASCULAR STRESS TEST  2017   Negative   CESAREAN  SECTION  1977; 1982   COLONOSCOPY     CYSTOSCOPY W/ RETROGRADES Right 06/07/2015   Procedure: CYSTOSCOPY WITH RETROGRADE PYELOGRAM;  Surgeon: Ardis Hughs, MD;  Location: [facility];  Service: Urology;  Laterality: Right;   CYSTOSCOPY WITH URETEROSCOPY AND STENT PLACEMENT Right 06/07/2015   Procedure: RIGHT URETEROSCOPY, LASER LITHOTRIPSY, STONE REMOVAL AND RIGHT URETERAL STENT PLACEMENT;  Surgeon: Ardis Hughs, MD;  Location: [facility];  Service: Urology;  Laterality: Right;   DIRECT LARYNGOSCOPY N/A 10/29/2021   Procedure: DIRECT LARYNGOSCOPY WITH BIOPSY;  Surgeon: Melida Quitter, MD;  Location: [place];  Service: ENT;  Laterality: N/A;   HERNIA REPAIR     HOLMIUM LASER APPLICATION N/A [id number]   Procedure: HOLMIUM LASER APPLICATION;  Surgeon: Ardis Hughs, MD;  Location: [facility];  Service: Urology;  Laterality: N/A;   LAPAROSCOPIC CHOLECYSTECTOMY  10/07/2000   LIPOMA EXCISION  01/2017   from neck   UMBILICAL HERNIA REPAIR  age 67   UPPER GI ENDOSCOPY     Patient Active Problem List   Diagnosis Date Noted   Precordial pain 12/07/2021   Chest pain 12/07/2021  Prediabetes 12/07/2021   Preprocedural examination 12/07/2021   Hyperlipidemia 12/07/2021   Palpitations 12/07/2021   Physical deconditioning 08/22/2021   History of COVID-19 01/04/2021   Adult onset stuttering 06/30/2020   Migraine 06/30/2020   Fibromyalgia 06/30/2020   Type 2 diabetes mellitus without complication ([place]) [id number]   TIA (transient ischemic attack) 06/30/2020   Atypical chest pain 08/13/2018   Depression, major 03/17/2018   Chronic pain syndrome 03/17/2018   Expressive aphasia 11/15/2016   Chronic cough 11/08/2015   B12 deficiency 12/07/2014   Wart viral 11/07/2014   Coarse tremors 02/09/2014   OSA on CPAP 01/28/2014   Functional neurological symptom disorder (conversion disorder), with abnormal movement 01/14/2014   Dyspnea  01/12/2014   Tremor of face and hands 01/12/2014   Breast pain in female 02/09/2013   Elevated WBC count 10/12/2012   Abdominal pain, other specified site 10/09/2012   Abdominal bloating 10/09/2012   Ovarian cystic mass 05/25/2012   Shoulder pain, right 03/11/2012   Right arm pain 12/30/2011   Acute sinusitis 07/30/2010   ACNE, ROSACEA 07/17/2010   LEG PAIN 07/17/2010   NECK PAIN 05/10/2010   ABDOMINAL PAIN, EPIGASTRIC 05/10/2010   BURSITIS, LEFT HIP 05/01/2010   PLANTAR FASCIITIS, BILATERAL 05/01/2010   HYPERGLYCEMIA 01/05/2010   PRURITUS 11/27/2009   TOBACCO USE, QUIT 11/27/2009   Anxiety state 11/22/2008   FATIGUE 11/22/2008   Essential hypertension 10/07/2008   Insomnia, unspecified 08/30/2008   Obesity 08/19/2008   NAUSEA ALONE 05/02/2008   VERTIGO 01/19/2008   DIARRHEA 01/19/2008   THYROID NODULE 06/23/2007   Dyslipidemia 06/23/2007   Situational depression 06/23/2007   ADD 06/23/2007   TMJ SYNDROME 06/23/2007   GERD 06/23/2007   Irritable bowel syndrome 06/23/2007   Headache 06/23/2007    PCP: Antony Contras, MD   REFERRING PROVIDER: Karenann Cai, PA-C   REFERRING DIAG:  [phone] (ICD-10-CM) - Other spondylosis, cervical region  M47.26 (ICD-10-CM) - Other spondylosis with radiculopathy, lumbar region    Rationale for Evaluation and Treatment: Rehabilitation  THERAPY DIAG:  Other low back pain  Difficulty in walking, not elsewhere classified  Muscle weakness (generalized)  ONSET DATE: >5 yrs  SUBJECTIVE:                                                                                                                                                                                           SUBJECTIVE STATEMENT: "I am better today, no pain"  It is mostly my back, butt cheeks to heels constantly. Hoping to get some pain relief. Have been coming here ([facility]) and  have been able to walk in pool.  PERTINENT HISTORY:  Conversion  disorder: looks like  stroke more frequent. Triggers are eye movements, certain words.  Occurring daily to every other day  PAIN:  Are you having pain? Yes: NPRS scale: current 0/10  worst 8.5/10 least 4/10 Pain location: LB, hips buttocks to heel Pain description: pins/needles, numb, ache Aggravating factors: sitting on commode; sitting Relieving factors: lying on stomach  PRECAUTIONS: Fall  WEIGHT BEARING RESTRICTIONS: No  FALLS:  Has patient fallen in last 6 months? No  LIVING ENVIRONMENT: Lives with: lives with their family and lives with their spouse Lives in: House/apartment Stairs: Yes: External: 1 steps; none Has following equipment at home: None  OCCUPATION: retired/disability  PLOF: Independent  PATIENT GOALS: decreased pain, walk better,  NEXT MD VISIT: feb 19  OBJECTIVE:   DIAGNOSTIC FINDINGS:    MRI LUMBAR SPINE WITHOUT CONTRAST 12/30/22 IMPRESSION: Stable mild degenerative changes of the lumbar spine without high-grade spinal canal or neural foraminal stenosis at any level.  PATIENT SURVEYS:  FOTO risk adjusted 42% with goal of 71   COGNITION: Overall cognitive status: Within functional limits for tasks assessed     SENSATION: Numbness and tingling/P&N from lb through feet intermittently  MUSCLE LENGTH: Unable to test  POSTURE:   PALPATION:   LUMBAR ROM:   AROM eval  Flexion Just below knees  Extension 25%  Right lateral flexion 25%  Left lateral flexion 50%  Right rotation   Left rotation    (Blank rows = not tested)        All pain limiting  LOWER EXTREMITY ROM:      WFL  LOWER EXTREMITY MMT:    MMT Right eval Left eval  Hip flexion 29.1 22.8  Hip extension    Hip abduction 29.6 21.0  Hip adduction    Hip internal rotation    Hip external rotation    Knee flexion    Knee extension 33.6 31.4  Ankle dorsiflexion    Ankle plantarflexion    Ankle inversion    Ankle eversion     (Blank rows = not tested)                            LLE pain  limiting  LUMBAR SPECIAL TESTS:    FUNCTIONAL TESTS:    Unable to test due to conversion episode  01/24/23 5 times sit to stand: 17.95 Timed up and go (TUG): 13.94 Berg Balance Scale: 38/56  GAIT: Distance walked: 400 Assistive device utilized: none Level of assistance: indep arriving; Hha leaving Comments: During conversion episode HHA of CG (husband), decreased step length with decreased cadence, lack of hip/knee flex just clearing feet from floor  TODAY'S TREATMENT:                                                                                                                              Initiated evaluation    PATIENT EDUCATION:  Education details: Discussed eval findings,  rehab rationale and POC and patient is in agreement  Person educated: Patient Education method: Explanation Education comprehension: verbalized understanding  HOME EXERCISE PROGRAM: TBA  ASSESSMENT:  CLINICAL IMPRESSION: Pt with improved tolerance to continued assessment. Was able to complete all objective testing. Tried to limit conversation to complete testing without added apprehension to reduce perceived stress which tends to trigger conversion reaction. Pt with good response.  Needed one 30s focused breathing break which she initiated and completed indep.   She reports no pain this am in seated resting position. Testing indicates weakness L>R le with decreased core strength. She is a mod to minimal fall risk. Will continue forward with aquatic intervention as noted on eval with focus to improve areas of deficit improving functional mobility.   Initial assessment: Patient is a 62 y.o. F who was seen today for physical therapy evaluation and treatment for  spondylosis of cervical region as well as lumbar with radiculopathy. She presents here today initially by herself. She states her pain has been increasing over the past year with worst symptoms being when she is seated on commode, legs go numb. Pt  tolerates lumbar ROM objective testing only before  having a conversion episode which limited her ability to tolerate the remainder of the assessment.  Her husband joined her and assisted her out of the building. She was able to complete her FOTO prior to leaving. Plan to continue with assessment as tolerated next session.  Will plan on aquatics vs land based at that time.      OBJECTIVE IMPAIRMENTS: decreased activity tolerance, decreased coordination, decreased endurance, decreased mobility, difficulty walking, and conversion disorder .   ACTIVITY LIMITATIONS: carrying, lifting, bending, sitting, standing, squatting, sleeping, stairs, and transfers  PARTICIPATION LIMITATIONS: cleaning, driving, shopping, community activity, occupation, and yard work  PERSONAL FACTORS: Time since onset of injury/illness/exacerbation and 1 comorbidity: conversion conversion disorder  are also affecting patient's functional outcome.   REHAB POTENTIAL: Fair    CLINICAL DECISION MAKING: Evolving/moderate complexity  EVALUATION COMPLEXITY: Moderate   GOALS: Goals reviewed with patient? Yes  SHORT TERM GOALS: Target date: 02/13/23  Pt to tolerate full sessions of therapy without increase in pain and without limitations due to conversion disorder Baseline:1st session limited Goal status: INITIAL  2.  Pt will tolerate objective testing and LT goals will be set Baseline: not tolerated Goal status: INITIAL  3.  Pt will report decrease in LB pain by at least 25% improving toleration to activities Baseline:  Goal status: INITIAL  4.  Pt will report decreased numbness of LE with sitting on commode (Modifying equipment:toilet or use of squatty potty) Baseline: numbness Goal status: INITIAL    LONG TERM GOALS: Target date: 03/13/23  Pt to meet stated Foto Goal 53% Baseline: 42% Goal status: INITIAL   PLAN:  PT FREQUENCY: 1-2x/week  PT DURATION: 4 weeks  PLANNED INTERVENTIONS: Therapeutic  exercises, Therapeutic activity, Neuromuscular re-education, Balance training, Gait training, Patient/Family education, Self Care, Joint mobilization, Stair training, DME instructions, Aquatic Therapy, Electrical stimulation, Spinal manipulation, Spinal mobilization, Cryotherapy, Moist heat, Traction, Ultrasound, Ionotophoresis '4mg'$ /ml Dexamethasone, Manual therapy, and Re-evaluation.  PLAN FOR NEXT SESSION: complete objective assessment   Denton Meek, PT MPT 01/24/2023, 9:46 AM  Referring diagnosis? Spondylosis cervical area and lumbar area with radiculopathy Treatment diagnosis? (if different than referring diagnosis) same What was this (referring dx) caused by? '[]'$  Surgery '[]'$  Fall '[x]'$  Ongoing issue '[]'$  Arthritis '[]'$  Other: ____________  Laterality: '[]'$  Rt '[]'$  Lt '[x]'$  Both  Check all possible  CPT codes:  *CHOOSE 10 OR LESS*    '[x]'$  97110 (Therapeutic Exercise)  '[]'$  92507 (SLP Treatment)  '[x]'$  97112 (Neuro Re-ed)   '[]'$  92526 (Swallowing Treatment)   '[x]'$  97116 (Gait Training)   '[]'$  28413 (Cognitive Training, 1st 15 minutes) '[x]'$  97140 (Manual Therapy)   '[]'$  97130 (Cognitive Training, each add'l 15 minutes)  '[x]'$  97164 (Re-evaluation)                              '[]'$  Other, List CPT Code ____________  '[x]'$  97530 (Therapeutic Activities)     '[x]'$  [id number] (Self Care)   '[]'$  All codes above (97110 - 97535)  '[]'$  97012 (Mechanical Traction)  '[x]'$  97014 (E-stim Unattended)  '[x]'$  97032 (E-stim manual)  '[x]'$  97033 (Ionto)  '[x]'$  97035 (Ultrasound) '[]'$  97750 (Physical Performance Training) '[x]'$  [id number] (Aquatic Therapy) '[]'$  97016 (Vasopneumatic Device) '[]'$  [id number] (Paraffin) '[]'$  97034 (Contrast Bath) '[]'$  97597 (Wound Care 1st 20 sq cm) '[]'$  97598 (Wound Care each add'l 20 sq cm) '[]'$  97760 (Orthotic Fabrication, Fitting, Training Initial) '[]'$  24401 (Prosthetic Management and Training Initial) '[]'$  [id number] (Orthotic or Prosthetic Training/ Modification Subsequent)

## 2023-01-27 NOTE — Telephone Encounter (Signed)
Unfortunately, I'm not able to accept any more new patients at this time.  I'm sorry! Thank you!  

## 2023-01-28 ENCOUNTER — Ambulatory Visit (HOSPITAL_COMMUNITY)
Admission: RE | Admit: 2023-01-28 | Discharge: 2023-01-28 | Disposition: A | Discharge status: Home / Self Care | Payer: Medicare PPO | Source: Ambulatory Visit | Attending: Family Medicine | Admitting: Family Medicine

## 2023-01-28 DIAGNOSIS — R1310 Dysphagia, unspecified: Secondary | ICD-10-CM

## 2023-01-28 DIAGNOSIS — R0989 Other specified symptoms and signs involving the circulatory and respiratory systems: Secondary | ICD-10-CM | POA: Diagnosis not present

## 2023-01-28 DIAGNOSIS — R053 Chronic cough: Secondary | ICD-10-CM

## 2023-01-28 NOTE — Progress Notes (Signed)
Modified Barium Swallow Study  Patient Details  Name: Paula [name] MRN: [id number] Date of Birth: November 24, 1961  Today's Date: 01/28/2023  Modified Barium Swallow completed.  Full report located under Chart Review in the Imaging Section.  History of Present Illness Paula [name] is a 62 y.o. female who was referred for OPMBS by Dr. Kara Mead, pulmonology, due report of nearly daily choking episodes that occur with both solids and liquids. [name] endorses sensitivity to certain textures and lengthy time needed to finish a meal.  PMHx includes anxiety, GERD, HH, IBS, OSA on CPAP, psychogenic tremor, ACDF 2005, biopsy right hypopharyngeal lesion Nov 2022- negative. CT soft tissue neck 09/07/22: Previous corpectomy and fusion from C4 through C7. Degenerative spondylosis at C3-4 with some ossification of the posterior longitudinal ligament.   Clinical Impression Pt presents with a likely functional dysphagia marked by hesitations, oral holding of liquids, and repetitive/disorganized tongue motion with purees and solids.  Pharyngeal swallow function is normal with reliable laryngeal vestibule closure, normal stripping wave, partial distension of PES due to presence of cervical hardware s/p ACDF. There was no penetration/aspiration; no residue post-swallow.  Pt's difficulty initiating the oral phase of the swallow was accompanied by secondary behaviors (facial grimacing, excessive effort).  We discussed results, including the likelihood that [name] has developed maladapative motor patterns during the oral phase of the swallow. These changes have impacted her ability to eat a meal in a timely manner and have created stress around the act of eating and sharing meals with others.  She may benefit from OP SLP to address reestablishment of normal oral sequencing and extinguishing of these aberrant patterns. Pt agrees with trial of OP SLP. In the meantime, continue current diet and liquids. Factors that  may increase risk of adverse event in presence of aspiration Phineas Douglas & Padilla 2021):    Swallow Evaluation Recommendations Recommendations: PO diet PO Diet Recommendation: Regular;Thin liquids (Level 0) Liquid Administration via: Cup;Straw Medication Administration: Whole meds with liquid Supervision: Patient able to self-feed   Cynthia Stainback L. Tivis Ringer, MA CCC/SLP Clinical Specialist - Acute Care SLP Acute Rehabilitation Services Office number [phone]    Juan Quam Laurice 01/28/2023,1:42 PM

## 2023-01-30 ENCOUNTER — Other Ambulatory Visit: Payer: Self-pay

## 2023-01-30 DIAGNOSIS — M47816 Spondylosis without myelopathy or radiculopathy, lumbar region: Secondary | ICD-10-CM | POA: Diagnosis not present

## 2023-01-30 DIAGNOSIS — R053 Chronic cough: Secondary | ICD-10-CM

## 2023-01-30 DIAGNOSIS — M4322 Fusion of spine, cervical region: Secondary | ICD-10-CM | POA: Diagnosis not present

## 2023-01-30 DIAGNOSIS — M7918 Myalgia, other site: Secondary | ICD-10-CM | POA: Diagnosis not present

## 2023-02-04 ENCOUNTER — Ambulatory Visit (HOSPITAL_BASED_OUTPATIENT_CLINIC_OR_DEPARTMENT_OTHER): Payer: Medicare PPO | Admitting: Physical Therapy

## 2023-02-06 ENCOUNTER — Ambulatory Visit (HOSPITAL_BASED_OUTPATIENT_CLINIC_OR_DEPARTMENT_OTHER): Payer: Medicare PPO | Attending: Orthopedic Surgery | Admitting: Physical Therapy

## 2023-02-06 ENCOUNTER — Encounter (HOSPITAL_BASED_OUTPATIENT_CLINIC_OR_DEPARTMENT_OTHER): Payer: Self-pay | Admitting: Physical Therapy

## 2023-02-06 DIAGNOSIS — R262 Difficulty in walking, not elsewhere classified: Secondary | ICD-10-CM | POA: Insufficient documentation

## 2023-02-06 DIAGNOSIS — M6281 Muscle weakness (generalized): Secondary | ICD-10-CM | POA: Insufficient documentation

## 2023-02-06 DIAGNOSIS — M5459 Other low back pain: Secondary | ICD-10-CM | POA: Insufficient documentation

## 2023-02-06 NOTE — Therapy (Signed)
OUTPATIENT PHYSICAL THERAPY THORACOLUMBAR TREATMENT   Patient Name: Paula [name] MRN: [id number] DOB:1961/07/30, 62 y.o., female Today's Date: 02/06/2023  END OF SESSION:  PT End of Session - 02/06/23 0828     Visit Number 3    Number of Visits 8    Date for PT Re-Evaluation 02/13/23    Authorization Type [employer]    Authorization Time Period through 03/02/23    PT Start Time 0813    PT Stop Time 0855    PT Time Calculation (min) 42 min    Behavior During Therapy [facility] for tasks assessed/performed             Past Medical History:  Diagnosis Date   Anxiety    pt stated anxiety episodes resemble stroke symptoms   Chronic lower back pain    Conversion disorder    Depression    Diverticulitis    Expressive aphasia 11/15/2016   Fibromyalgia    GERD (gastroesophageal reflux disease)    Heart murmur    High cholesterol    History of gastric ulcer    History of hiatal hernia    History of kidney stones    History of thyroid nodule    Hyperlipidemia    Hypertension    IBS (irritable bowel syndrome)    Intermittent vertigo    Migraine    "a few times/year now; maybe" (11/15/2016)   Obesity    OSA on CPAP    wears CPAP nightly ;study in epic 02-01-2014 (11/15/2016)   Osteoarthritis    "knees, hands, back, hips" (11/15/2016)   Pneumonia X 1   hx of   Psychogenic tremor    intermittant head tremor-(11/15/2016)   Refusal of blood transfusions as patient is Jehovah's Witness    RLS (restless legs syndrome)    Sleep apnea    CPAP   Urinary incontinence    Vertigo    Past Surgical History:  Procedure Laterality Date   ABDOMINAL HYSTERECTOMY  1990   Partial, still has ovaries   ANTERIOR CERVICAL DECOMP/DISCECTOMY FUSION  02/10/2004   C4 -- C7   BACK SURGERY     CARDIOVASCULAR STRESS TEST  2017   Negative   CESAREAN SECTION  1977; 1982   COLONOSCOPY     CYSTOSCOPY W/ RETROGRADES Right 06/07/2015   Procedure: CYSTOSCOPY WITH RETROGRADE PYELOGRAM;  Surgeon:  Ardis Hughs, MD;  Location: [facility];  Service: Urology;  Laterality: Right;   CYSTOSCOPY WITH URETEROSCOPY AND STENT PLACEMENT Right 06/07/2015   Procedure: RIGHT URETEROSCOPY, LASER LITHOTRIPSY, STONE REMOVAL AND RIGHT URETERAL STENT PLACEMENT;  Surgeon: Ardis Hughs, MD;  Location: [facility];  Service: Urology;  Laterality: Right;   DIRECT LARYNGOSCOPY N/A 10/29/2021   Procedure: DIRECT LARYNGOSCOPY WITH BIOPSY;  Surgeon: Melida Quitter, MD;  Location: [place];  Service: ENT;  Laterality: N/A;   HERNIA REPAIR     HOLMIUM LASER APPLICATION N/A [id number]   Procedure: HOLMIUM LASER APPLICATION;  Surgeon: Ardis Hughs, MD;  Location: [facility];  Service: Urology;  Laterality: N/A;   LAPAROSCOPIC CHOLECYSTECTOMY  10/07/2000   LIPOMA EXCISION  01/2017   from neck   UMBILICAL HERNIA REPAIR  age 52   UPPER GI ENDOSCOPY     Patient Active Problem List   Diagnosis Date Noted   Precordial pain 12/07/2021   Chest pain 12/07/2021   Prediabetes 12/07/2021   Preprocedural examination 12/07/2021   Hyperlipidemia 12/07/2021   Palpitations 12/07/2021   Physical  deconditioning 08/22/2021   History of COVID-19 01/04/2021   Adult onset stuttering 06/30/2020   Migraine 06/30/2020   Fibromyalgia 06/30/2020   Type 2 diabetes mellitus without complication ([place]) [id number]   TIA (transient ischemic attack) 06/30/2020   Atypical chest pain 08/13/2018   Depression, major 03/17/2018   Chronic pain syndrome 03/17/2018   Expressive aphasia 11/15/2016   Chronic cough 11/08/2015   B12 deficiency 12/07/2014   Wart viral 11/07/2014   Coarse tremors 02/09/2014   OSA on CPAP 01/28/2014   Functional neurological symptom disorder (conversion disorder), with abnormal movement 01/14/2014   Dyspnea 01/12/2014   Tremor of face and hands 01/12/2014   Breast pain in female 02/09/2013   Elevated WBC count 10/12/2012   Abdominal pain, other specified  site 10/09/2012   Abdominal bloating 10/09/2012   Ovarian cystic mass 05/25/2012   Shoulder pain, right 03/11/2012   Right arm pain 12/30/2011   Acute sinusitis 07/30/2010   ACNE, ROSACEA 07/17/2010   LEG PAIN 07/17/2010   NECK PAIN 05/10/2010   ABDOMINAL PAIN, EPIGASTRIC 05/10/2010   BURSITIS, LEFT HIP 05/01/2010   PLANTAR FASCIITIS, BILATERAL 05/01/2010   HYPERGLYCEMIA 01/05/2010   PRURITUS 11/27/2009   TOBACCO USE, QUIT 11/27/2009   Anxiety state 11/22/2008   FATIGUE 11/22/2008   Essential hypertension 10/07/2008   Insomnia, unspecified 08/30/2008   Obesity 08/19/2008   NAUSEA ALONE 05/02/2008   VERTIGO 01/19/2008   DIARRHEA 01/19/2008   THYROID NODULE 06/23/2007   Dyslipidemia 06/23/2007   Situational depression 06/23/2007   ADD 06/23/2007   TMJ SYNDROME 06/23/2007   GERD 06/23/2007   Irritable bowel syndrome 06/23/2007   Headache 06/23/2007    PCP: Antony Contras, MD   REFERRING PROVIDER: Karenann Cai, PA-C   REFERRING DIAG:  [phone] (ICD-10-CM) - Other spondylosis, cervical region  M47.26 (ICD-10-CM) - Other spondylosis with radiculopathy, lumbar region    Rationale for Evaluation and Treatment: Rehabilitation  THERAPY DIAG:  Other low back pain  Difficulty in walking, not elsewhere classified  Muscle weakness (generalized)  ONSET DATE: >5 yrs  SUBJECTIVE:                                                                                                                                                                                           SUBJECTIVE STATEMENT: "I love the water".   PERTINENT HISTORY:  Conversion disorder: looks like stroke more frequent. Triggers are eye movements, certain words.  Occurring daily to every other day  PAIN:  Are you having pain? Yes: NPRS scale: 3/10 Pain location: buttocks to hamstrings Pain description: pins/needles, numb, ache Aggravating factors: sitting on commode; sitting Relieving factors: lying on  stomach  PRECAUTIONS: Fall  WEIGHT BEARING RESTRICTIONS: No  FALLS:  Has patient fallen in last 6 months? No  LIVING ENVIRONMENT: Lives with: lives with their family and lives with their spouse Lives in: House/apartment Stairs: Yes: External: 1 steps; none Has following equipment at home: None  OCCUPATION: retired/disability  PLOF: Independent  PATIENT GOALS: decreased pain, walk better,  NEXT MD VISIT:   OBJECTIVE:   DIAGNOSTIC FINDINGS:    MRI LUMBAR SPINE WITHOUT CONTRAST 12/30/22 IMPRESSION: Stable mild degenerative changes of the lumbar spine without high-grade spinal canal or neural foraminal stenosis at any level.  PATIENT SURVEYS:  FOTO risk adjusted 42% with goal of 68   COGNITION: Overall cognitive status: Within functional limits for tasks assessed     SENSATION: Numbness and tingling/P&N from lb through feet intermittently  MUSCLE LENGTH: Unable to test  POSTURE:   PALPATION:   LUMBAR ROM:   AROM eval  Flexion Just below knees  Extension 25%  Right lateral flexion 25%  Left lateral flexion 50%  Right rotation   Left rotation    (Blank rows = not tested)        All pain limiting  LOWER EXTREMITY ROM:      WFL  LOWER EXTREMITY MMT:    MMT Right eval Left eval  Hip flexion 29.1 22.8  Hip extension    Hip abduction 29.6 21.0  Hip adduction    Hip internal rotation    Hip external rotation    Knee flexion    Knee extension 33.6 31.4  Ankle dorsiflexion    Ankle plantarflexion    Ankle inversion    Ankle eversion     (Blank rows = not tested)                            LLE pain limiting  LUMBAR SPECIAL TESTS:    FUNCTIONAL TESTS:    Unable to test due to conversion episode  01/24/23 5 times sit to stand: 17.95 Timed up and go (TUG): 13.94 Berg Balance Scale: 38/56  GAIT: Distance walked: 400 Assistive device utilized: none Level of assistance: indep arriving; Hha leaving Comments: During conversion episode HHA of CG  (husband), decreased step length with decreased cadence, lack of hip/knee flex just clearing feet from floor  TODAY'S TREATMENT:                                                                                                                               Pt seen for aquatic therapy today.  Treatment took place in water 3.25-4.5 ft in depth at the [facility]. Temp of water was 91.  Pt entered/exited the pool via stairs independently with bilat rail.  * SELF CARE: Prior to entry in water, pt instructed in self massage with ball to glutes. Pt returned demo with cues.  * unsupported walking forward/ backward/ side stepping * holding wall:  hip abdct/ addct; hip ext to toe touch; hip circles CW/CCW; forward kicks (with LAQ)  * high knee march in place and forward/ backward  * holding wall: hip openers/crosses * return to walking forward/ backward * at stairs: hamstring stretch, foot on 2nd step;  piriformis/glute max stretch - 15s x 2 reps each LE Pt requires the buoyancy and hydrostatic pressure of water for support, and to offload joints by unweighting joint load by at least 50 % in navel deep water and by at least 75-80% in chest to neck deep water.  Viscosity of the water is needed for resistance of strengthening. Water current perturbations provides challenge to standing balance requiring increased core activation.    PATIENT EDUCATION:  Education details: HEP issued. Self massage discussed. Person educated: Patient Education method: Explanation, demo, hand out Education comprehension: verbalized understanding  HOME EXERCISE PROGRAM: Access Code: [email or web address] URL: [email or web address] Date: 02/06/2023 Prepared by: [facility] [address]  Exercises - Seated Piriformis Stretch  - 1-2 x daily - 7 x weekly - 2-3 reps - 15-30 seconds  hold - Seated Hamstring Stretch  - 1-2 x daily - 7 x weekly - 2-3 reps - 15-30 seconds   hold  ASSESSMENT:  CLINICAL IMPRESSION: Pt is confident in aquatic setting and able to take direction from therapist on deck. Good tolerance for aquatic exercises, including stretches with no increase in pain.  LLE is very tight, added stretches to HEP to address this. Encouraged pt to complete stretches next week when on vacation.   Will continue forward with aquatic intervention as noted on eval with focus to improve areas of deficit improving functional mobility. Goals are ongoing.    Initial assessment: Patient is a 62 y.o. F who was seen today for physical therapy evaluation and treatment for  spondylosis of cervical region as well as lumbar with radiculopathy. She presents here today initially by herself. She states her pain has been increasing over the past year with worst symptoms being when she is seated on commode, legs go numb. Pt tolerates lumbar ROM objective testing only before  having a conversion episode which limited her ability to tolerate the remainder of the assessment.  Her husband joined her and assisted her out of the building. She was able to complete her FOTO prior to leaving. Plan to continue with assessment as tolerated next session.  Will plan on aquatics vs land based at that time.      OBJECTIVE IMPAIRMENTS: decreased activity tolerance, decreased coordination, decreased endurance, decreased mobility, difficulty walking, and conversion disorder .   ACTIVITY LIMITATIONS: carrying, lifting, bending, sitting, standing, squatting, sleeping, stairs, and transfers  PARTICIPATION LIMITATIONS: cleaning, driving, shopping, community activity, occupation, and yard work  PERSONAL FACTORS: Time since onset of injury/illness/exacerbation and 1 comorbidity: conversion conversion disorder  are also affecting patient's functional outcome.   REHAB POTENTIAL: Fair    CLINICAL DECISION MAKING: Evolving/moderate complexity  EVALUATION COMPLEXITY: Moderate   GOALS: Goals  reviewed with patient? Yes  SHORT TERM GOALS: Target date: 02/13/23  Pt to tolerate full sessions of therapy without increase in pain and without limitations due to conversion disorder Baseline:1st session limited Goal status: INITIAL  2.  Pt will tolerate objective testing and LT goals will be set Baseline: not tolerated Goal status: INITIAL  3.  Pt will report decrease in LB pain by at least 25% improving toleration to activities Baseline:  Goal status: INITIAL  4.  Pt will report decreased numbness of LE with  sitting on commode (Modifying equipment:toilet or use of squatty potty) Baseline: numbness Goal status: INITIAL    LONG TERM GOALS: Target date: 03/13/23  Pt to meet stated Foto Goal 53% Baseline: 42% Goal status: INITIAL   PLAN:  PT FREQUENCY: 1-2x/week  PT DURATION: 4 weeks  PLANNED INTERVENTIONS: Therapeutic exercises, Therapeutic activity, Neuromuscular re-education, Balance training, Gait training, Patient/Family education, Self Care, Joint mobilization, Stair training, DME instructions, Aquatic Therapy, Electrical stimulation, Spinal manipulation, Spinal mobilization, Cryotherapy, Moist heat, Traction, Ultrasound, Ionotophoresis '4mg'$ /ml Dexamethasone, Manual therapy, and Re-evaluation.  PLAN FOR NEXT SESSION: assess response to aquatic exercise.  Kerin Perna, PTA 02/06/23 9:33 AM [place], [place], [address] Phone: [phone]   Fax:  [phone]    Referring diagnosis? Spondylosis cervical area and lumbar area with radiculopathy Treatment diagnosis? (if different than referring diagnosis) same What was this (referring dx) caused by? '[]'$  Surgery '[]'$  Fall '[x]'$  Ongoing issue '[]'$  Arthritis '[]'$  Other: ____________  Laterality: '[]'$  Rt '[]'$  Lt '[x]'$  Both  Check all possible CPT codes:  *CHOOSE 10 OR LESS*    '[x]'$  97110 (Therapeutic Exercise)  '[]'$  92507 (SLP Treatment)  '[x]'$   97112 (Neuro Re-ed)   '[]'$  92526 (Swallowing Treatment)   '[x]'$  97116 (Gait Training)   '[]'$  [id number] (Cognitive Training, 1st 15 minutes) '[x]'$  97140 (Manual Therapy)   '[]'$  97130 (Cognitive Training, each add'l 15 minutes)  '[x]'$  97164 (Re-evaluation)                              '[]'$  Other, List CPT Code ____________  '[x]'$  97530 (Therapeutic Activities)     '[x]'$  97535 (Self Care)   '[]'$  All codes above (97110 - 97535)  '[]'$  97012 (Mechanical Traction)  '[x]'$  97014 (E-stim Unattended)  '[x]'$  97032 (E-stim manual)  '[x]'$  97033 (Ionto)  '[x]'$  97035 (Ultrasound) '[]'$  97750 (Physical Performance Training) '[x]'$  [id number] (Aquatic Therapy) '[]'$  97016 (Vasopneumatic Device) '[]'$  [id number] (Paraffin) '[]'$  97034 (Contrast Bath) '[]'$  97597 (Wound Care 1st 20 sq cm) '[]'$  97598 (Wound Care each add'l 20 sq cm) '[]'$  97760 (Orthotic Fabrication, Fitting, Training Initial) '[]'$  [id number] (Prosthetic Management and Training Initial) '[]'$  [id number] (Orthotic or Prosthetic Training/ Modification Subsequent)

## 2023-02-07 ENCOUNTER — Ambulatory Visit (HOSPITAL_BASED_OUTPATIENT_CLINIC_OR_DEPARTMENT_OTHER): Payer: Medicare PPO | Admitting: Physical Therapy

## 2023-02-16 DIAGNOSIS — G4733 Obstructive sleep apnea (adult) (pediatric): Secondary | ICD-10-CM | POA: Diagnosis not present

## 2023-02-18 ENCOUNTER — Ambulatory Visit (HOSPITAL_BASED_OUTPATIENT_CLINIC_OR_DEPARTMENT_OTHER): Payer: Medicare PPO | Admitting: Physical Therapy

## 2023-02-18 ENCOUNTER — Encounter (HOSPITAL_BASED_OUTPATIENT_CLINIC_OR_DEPARTMENT_OTHER): Payer: Self-pay | Admitting: Physical Therapy

## 2023-02-18 DIAGNOSIS — M6281 Muscle weakness (generalized): Secondary | ICD-10-CM

## 2023-02-18 DIAGNOSIS — R262 Difficulty in walking, not elsewhere classified: Secondary | ICD-10-CM | POA: Diagnosis not present

## 2023-02-18 DIAGNOSIS — M5459 Other low back pain: Secondary | ICD-10-CM | POA: Diagnosis not present

## 2023-02-18 NOTE — Therapy (Addendum)
PHYSICAL THERAPY DISCHARGE SUMMARY  Visits from Start of Care: 4  Current functional level related to goals / functional outcomes: unknown   Remaining deficits: LBP   Education / Equipment: Management of condition   Patient agrees to discharge. Patient goals were partially met. Patient is being discharged due to not returning since the last visit.  Addend [name]) [name] MPT 05/09/23 1043a  OUTPATIENT PHYSICAL THERAPY THORACOLUMBAR TREATMENT Progress Note/Re-cert Reporting Period 01/17/23 to 02/18/23  See note below for Objective Data and Assessment of Progress/Goals.      Patient Name: Paula [name] MRN: [id number] DOB:August 08, 1961, 62 y.o., female, female Today's Date: 02/18/2023  END OF SESSION:  PT End of Session - 02/18/23 1329     Visit Number 4    Number of Visits 12    Date for PT Re-Evaluation 03/21/23    Authorization Type humana [employer]    Authorization Time Period through 03/02/23    PT Start Time 0947    PT Stop Time 1030    PT Time Calculation (min) 43 min    Activity Tolerance Patient tolerated treatment well    Behavior During Therapy [facility] for tasks assessed/performed              Past Medical History:  Diagnosis Date   Anxiety    pt stated anxiety episodes resemble stroke symptoms   Chronic lower back pain    Conversion disorder    Depression    Diverticulitis    Expressive aphasia 11/15/2016   Fibromyalgia    GERD (gastroesophageal reflux disease)    Heart murmur    High cholesterol    History of gastric ulcer    History of hiatal hernia    History of kidney stones    History of thyroid nodule    Hyperlipidemia    Hypertension    IBS (irritable bowel syndrome)    Intermittent vertigo    Migraine    "a few times/year now; maybe" (11/15/2016)   Obesity    OSA on CPAP    wears CPAP nightly ;study in epic 02-01-2014 (11/15/2016)   Osteoarthritis    "knees, hands, back, hips" (11/15/2016)   Pneumonia X 1   hx of   Psychogenic tremor     intermittant head tremor-(11/15/2016)   Refusal of blood transfusions as patient is Jehovah's Witness    RLS (restless legs syndrome)    Sleep apnea    CPAP   Urinary incontinence    Vertigo    Past Surgical History:  Procedure Laterality Date   ABDOMINAL HYSTERECTOMY  1990   Partial, still has ovaries   ANTERIOR CERVICAL DECOMP/DISCECTOMY FUSION  02/10/2004   C4 -- C7   BACK SURGERY     CARDIOVASCULAR STRESS TEST  2017   Negative   CESAREAN SECTION  1977; 1982   COLONOSCOPY     CYSTOSCOPY W/ RETROGRADES Right 06/07/2015   Procedure: CYSTOSCOPY WITH RETROGRADE PYELOGRAM;  Surgeon: Crist Fat, MD;  Location: [facility];  Service: Urology;  Laterality: Right;   CYSTOSCOPY WITH URETEROSCOPY AND STENT PLACEMENT Right 06/07/2015   Procedure: RIGHT URETEROSCOPY, LASER LITHOTRIPSY, STONE REMOVAL AND RIGHT URETERAL STENT PLACEMENT;  Surgeon: Crist Fat, MD;  Location: [facility];  Service: Urology;  Laterality: Right;   DIRECT LARYNGOSCOPY N/A 10/29/2021   Procedure: DIRECT LARYNGOSCOPY WITH BIOPSY;  Surgeon: Christia Reading, MD;  Location: [facility];  Service: ENT;  Laterality: N/A;   HERNIA REPAIR     HOLMIUM LASER APPLICATION  N/A 06/07/2015   Procedure: HOLMIUM LASER APPLICATION;  Surgeon: Crist Fat, MD;  Location: [facility];  Service: Urology;  Laterality: N/A;   LAPAROSCOPIC CHOLECYSTECTOMY  10/07/2000   LIPOMA EXCISION  01/2017   from neck   UMBILICAL HERNIA REPAIR  age 62   UPPER GI ENDOSCOPY     Patient Active Problem List   Diagnosis Date Noted   Precordial pain 12/07/2021   Chest pain 12/07/2021   Prediabetes 12/07/2021   Preprocedural examination 12/07/2021   Hyperlipidemia 12/07/2021   Palpitations 12/07/2021   Physical deconditioning 08/22/2021   History of COVID-19 01/04/2021   Adult onset stuttering 06/30/2020   Migraine 06/30/2020   Fibromyalgia 06/30/2020   Type 2 diabetes mellitus without  complication (HCC) 06/30/2020   TIA (transient ischemic attack) 06/30/2020   Atypical chest pain 08/13/2018   Depression, major 03/17/2018   Chronic pain syndrome 03/17/2018   Expressive aphasia 11/15/2016   Chronic cough 11/08/2015   B12 deficiency 12/07/2014   Wart viral 11/07/2014   Coarse tremors 02/09/2014   OSA on CPAP 01/28/2014   Functional neurological symptom disorder (conversion disorder), with abnormal movement 01/14/2014   Dyspnea 01/12/2014   Tremor of face and hands 01/12/2014   Breast pain in female 02/09/2013   Elevated WBC count 10/12/2012   Abdominal pain, other specified site 10/09/2012   Abdominal bloating 10/09/2012   Ovarian cystic mass 05/25/2012   Shoulder pain, right 03/11/2012   Right arm pain 12/30/2011   Acute sinusitis 07/30/2010   ACNE, ROSACEA 07/17/2010   LEG PAIN 07/17/2010   NECK PAIN 05/10/2010   ABDOMINAL PAIN, EPIGASTRIC 05/10/2010   BURSITIS, LEFT HIP 05/01/2010   PLANTAR FASCIITIS, BILATERAL 05/01/2010   HYPERGLYCEMIA 01/05/2010   PRURITUS 11/27/2009   TOBACCO USE, QUIT 11/27/2009   Anxiety state 11/22/2008   FATIGUE 11/22/2008   Essential hypertension 10/07/2008   Insomnia, unspecified 08/30/2008   Obesity 08/19/2008   NAUSEA ALONE 05/02/2008   VERTIGO 01/19/2008   DIARRHEA 01/19/2008   THYROID NODULE 06/23/2007   Dyslipidemia 06/23/2007   Situational depression 06/23/2007   ADD 06/23/2007   TMJ SYNDROME 06/23/2007   GERD 06/23/2007   Irritable bowel syndrome 06/23/2007   Headache 06/23/2007    PCP: Tally Joe, MD   REFERRING PROVIDER: Verlin Fester, PA-C   REFERRING DIAG:  [phone] (ICD-10-CM) - Other spondylosis, cervical region  M47.26 (ICD-10-CM) - Other spondylosis with radiculopathy, lumbar region    Rationale for Evaluation and Treatment: Rehabilitation  THERAPY DIAG:  Other low back pain  Difficulty in walking, not elsewhere classified  Muscle weakness (generalized)  ONSET DATE: >5  yrs  SUBJECTIVE:  SUBJECTIVE STATEMENT: "I walked a lot on the cruise".   PERTINENT HISTORY:  Conversion disorder: looks like stroke more frequent. Triggers are eye movements, certain words.  Occurring daily to every other day  PAIN:  Are you having pain? Yes: NPRS scale: 4/10 Pain location: buttocks to hamstrings Pain description: pins/needles, numb, ache Aggravating factors: sitting on commode; sitting Relieving factors: lying on stomach  PRECAUTIONS: Fall  WEIGHT BEARING RESTRICTIONS: No  FALLS:  Has patient fallen in last 6 months? No  LIVING ENVIRONMENT: Lives with: lives with their family and lives with their spouse Lives in: House/apartment Stairs: Yes: External: 1 steps; none Has following equipment at home: None  OCCUPATION: retired/disability  PLOF: Independent  PATIENT GOALS: decreased pain, walk better,  NEXT MD VISIT:   OBJECTIVE:   DIAGNOSTIC FINDINGS:    MRI LUMBAR SPINE WITHOUT CONTRAST 12/30/22 IMPRESSION: Stable mild degenerative changes of the lumbar spine without high-grade spinal canal or neural foraminal stenosis at any level.  PATIENT SURVEYS:  FOTO risk adjusted 42% with goal of 53 02/18/23: 42%   COGNITION: Overall cognitive status: Within functional limits for tasks assessed     SENSATION: Numbness and tingling/P&N from lb through feet intermittently  MUSCLE LENGTH: Unable to test  POSTURE:   PALPATION:   LUMBAR ROM:   AROM eval 02/18/23  Flexion Just below knees Mid calf  Extension 25% Full P! End   Right lateral flexion 25% NT  Left lateral flexion 50% NT  Right rotation    Left rotation     (Blank rows = not tested)        All pain limiting  LOWER EXTREMITY ROM:      WFL  LOWER EXTREMITY MMT:    MMT Right eval Left eval Right  / Left  Hip flexion 29.1 22.8   Hip extension     Hip abduction 29.6 21.0   Hip adduction     Hip internal rotation     Hip external rotation     Knee flexion     Knee extension 33.6 31.4   Ankle dorsiflexion     Ankle plantarflexion     Ankle inversion     Ankle eversion      (Blank rows = not tested)                            LLE pain limiting  LUMBAR SPECIAL TESTS:    FUNCTIONAL TESTS:    Unable to test due to conversion episode  01/24/23 5 times sit to stand: 17.95 Timed up and go (TUG): 13.94 Berg Balance Scale: 38/56  02/18/23: 5x STS: 14.73              TUG: 10.50       BERG: 41/56 GAIT: Distance walked: 400 Assistive device utilized: none Level of assistance: indep arriving; Hha leaving Comments: During conversion episode HHA of CG (husband), decreased step length with decreased cadence, lack of hip/knee flex just clearing feet from floor  TODAY'S TREATMENT:  Objective testing  Pt seen for aquatic therapy today.  Treatment took place in water 3.25-4.5 ft in depth at the [facility] pool. Temp of water was 91.  Pt entered/exited the pool via stairs independently with bilat rail.   * unsupported walking forward/ backward/ side stepping * holding wall:  hip abdct/ addct; hip ext to toe touch; hip circles CW/CCW; forward kicks (with LAQ)  *L stretch * holding wall: hip openers/crosses  Pt requires the buoyancy and hydrostatic pressure of water for support, and to offload joints by unweighting joint load by at least 50 % in navel deep water and by at least 75-80% in chest to neck deep water.  Viscosity of the water is needed for resistance of strengthening. Water current perturbations provides challenge to standing balance requiring increased core activation.    PATIENT EDUCATION:  Education details: HEP issued. Self massage  discussed. Person educated: Patient Education method: Explanation, demo, hand out Education comprehension: verbalized understanding  HOME EXERCISE PROGRAM: Access Code: [email or web address] URL: [email or web address] Date: 02/06/2023 Prepared by: [facility] - [address]  Exercises - Seated Piriformis Stretch  - 1-2 x daily - 7 x weekly - 2-3 reps - 15-30 seconds  hold - Seated Hamstring Stretch  - 1-2 x daily - 7 x weekly - 2-3 reps - 15-30 seconds  hold  ASSESSMENT:  CLINICAL IMPRESSION:  PN: Pt began having a conversion reaction during testing today. (She believes they are stress related which discomfort with lumbar ROM testing may have triggered). Was able to complete sufficient majority of items to test for PN. She has only been able to attend 3 out of 8 anticipated appts due to scheduling conflicts and being OOT.  She demonstrates improvement on all functional tests and with lumbar ROM as noted above indicating progress made in short time.  She will continue to benefit from skilled PT with alternating land and aquatic to progress further towards meeting all goals and her instruction and indep with final HEP.          OBJECTIVE IMPAIRMENTS: decreased activity tolerance, decreased coordination, decreased endurance, decreased mobility, difficulty walking, and conversion disorder .   ACTIVITY LIMITATIONS: carrying, lifting, bending, sitting, standing, squatting, sleeping, stairs, and transfers  PARTICIPATION LIMITATIONS: cleaning, driving, shopping, community activity, occupation, and yard work  PERSONAL FACTORS: Time since onset of injury/illness/exacerbation and 1 comorbidity: conversion conversion disorder  are also affecting patient's functional outcome.   REHAB POTENTIAL: Fair    CLINICAL DECISION MAKING: Evolving/moderate complexity  EVALUATION COMPLEXITY: Moderate   GOALS: Goals reviewed with patient? Yes  SHORT TERM GOALS: Target date:  02/13/23  Pt to tolerate full sessions of therapy without increase in pain and without limitations due to conversion disorder Baseline:1st session limited Goal status: Met 02/18/23  2.  Pt will tolerate objective testing and LT goals will be set Baseline: not tolerated Goal status: Met  3.  Pt will report decrease in LB pain by at least 25% improving toleration to activities Baseline:  Goal status: Ongoing 02/18/23  4.  Pt will report decreased numbness of LE with sitting on commode (Modifying equipment:toilet or use of squatty potty) Baseline: numbness Goal status: ongoing 02/18/23    LONG TERM GOALS: Target date: 03/21/23  Pt to meet stated Foto Goal 53% Baseline: 42% Goal status: ongoing   PLAN:  PT FREQUENCY: 1-2x/week  PT DURATION: 4 weeks  PLANNED INTERVENTIONS: Therapeutic exercises, Therapeutic activity, Neuromuscular re-education, Balance training, Gait training, Patient/Family education, Self Care, Joint mobilization, Stair  training, DME instructions, Aquatic Therapy, Electrical stimulation, Spinal manipulation, Spinal mobilization, Cryotherapy, Moist heat, Traction, Ultrasound, Ionotophoresis 4mg /ml Dexamethasone, Manual therapy, and Re-evaluation.  PLAN FOR NEXT SESSION: assess response to aquatic exercise.  Corrie Dandy [place]) Brooks Stotz MPT 02/18/23 1:33 PM [facility] [address] [place], [place], [address] Phone: [phone]   Fax:  [phone]  Addend Rushie Chestnut) Hannia Matchett MPT 02/18/23  138p    Referring diagnosis? Spondylosis cervical area and lumbar area with radiculopathy Treatment diagnosis? (if different than referring diagnosis) same What was this (referring dx) caused by? []  Surgery []  Fall [x]  Ongoing issue []  Arthritis []  Other: ____________  Laterality: []  Rt []  Lt [x]  Both  Check all possible CPT codes:  *CHOOSE 10 OR LESS*    [x]  97110 (Therapeutic Exercise)  []  92507 (SLP  Treatment)  [x]  97112 (Neuro Re-ed)   []  92526 (Swallowing Treatment)   [x]  97116 (Gait Training)   []  [id number] (Cognitive Training, 1st 15 minutes) [x]  97140 (Manual Therapy)   []  97130 (Cognitive Training, each add'l 15 minutes)  [x]  97164 (Re-evaluation)                              []  Other, List CPT Code ____________  [x]  97530 (Therapeutic Activities)     [x]  97535 (Self Care)   []  All codes above (97110 - 97535)  []  97012 (Mechanical Traction)  [x]  97014 (E-stim Unattended)  [x]  97032 (E-stim manual)  [x]  97033 (Ionto)  [x]  97035 (Ultrasound) []  97750 (Physical Performance Training) [x]  [id number] (Aquatic Therapy) []  97016 (Vasopneumatic Device) []  [id number] (Paraffin) []  97034 (Contrast Bath) []  97597 (Wound Care 1st 20 sq cm) []  97598 (Wound Care each add'l 20 sq cm) []  97760 (Orthotic Fabrication, Fitting, Training Initial) []  [id number] (Prosthetic Management and Training Initial) []  [id number] (Orthotic or Prosthetic Training/ Modification Subsequent)

## 2023-02-18 NOTE — Addendum Note (Signed)
Addended by: Felicie Morn F on: 02/18/2023 01:39 PM   Modules accepted: Orders

## 2023-02-19 ENCOUNTER — Encounter (HOSPITAL_COMMUNITY): Payer: Self-pay | Admitting: Clinical

## 2023-02-19 ENCOUNTER — Ambulatory Visit (INDEPENDENT_AMBULATORY_CARE_PROVIDER_SITE_OTHER): Payer: Medicare PPO | Admitting: Clinical

## 2023-02-19 DIAGNOSIS — F32A Depression, unspecified: Secondary | ICD-10-CM

## 2023-02-19 DIAGNOSIS — F419 Anxiety disorder, unspecified: Secondary | ICD-10-CM | POA: Diagnosis not present

## 2023-02-19 NOTE — Progress Notes (Unsigned)
THERAPIST PROGRESS NOTE  Session Time: 2:05-3:00pm  Participation Level: Active  Behavioral Response: Neat and Well Groomed Alert Euthymic  Type of Therapy: Individual Therapy  Treatment Goals addressed:  LTG: Claudett will score less than 5 on the Generalized Anxiety Disorder 7 Scale (GAD-7)    STG: Terran will reduce frequency of avoidant behaviors by 50% as evidenced by self-report in therapy sessions  LTG: Reduce frequency, intensity, and duration of depression symptoms so that daily functioning is improved   STG: Reduce overall depression score to no higher than 9 on the Patient Health Questionnaire (PHQ-9)   Intervention: Work with Justice Rocher to track symptoms, triggers, and/or skill use through a mood chart, diary card, or journal    ProgressTowards Goals: Progressing  Interventions: CBT, Psychosocial Skills: "I" statements, and Supportive  Summary: QUANTELLA QUAM is a 62 y.o. female who presents for therapy because her doctors feel she has a Conversion Disorder and has not worked out some of her issues which are now coming out physically.  She stated things have been going pretty good recently and they just returned a few days ago from a cruise in the [place].  She went to physical therapy and stated that just the movement of trying to reach for her toes triggered one of her attacks.  She could not talk, her mouth became twisted, and she was off balance.  She continued to talk about how she does not understand her symptoms.  This happens anytime she goes to PT so she thinks it is somehow related to movement, but the physical therapist thinks it is related to when she has pain at a certain level.  She does not agree, stating that sometimes it happens as early as when she first wakes up in the morning.  Only muscle relaxants are effective in relieving the symptoms.  She reported that she is suddenly doing a lot of binge-eating and has been gaining weight.  Certainly on the cruise this  was happening, but she does not understand why she craves food so badly.  There have been times she cannot make the craving for a certain food recede unless she actually goes to the grocery store and purchases it.  CSW introduced the idea of a 12-step program for food addiction such as Overeaters Anonymous and gave her a pamphlet about same.    We talked about the medical doctors thinking this is a conversion disorder and she asked again for CSW's opinion.  CSW deferred to the psychiatrists available in the practice and encouraged her to make an appointment for a psychiatric evaluation.  The rest of the session was spent talking about her family of origin and how she never felt like she mattered, even to the current time.  She is still very much invested in a competitive stance with her sister.  She remains angry at her mother and at her sister for the living situation she is in as well as for having such a direct responsibility for her being in that position.  She wants her mother to stop complaining about things at sister's house if she is not willing to return to patient's house.  CSW encouraged her to set boundaries with her mother about what she is willing to listen to her complain about, with mother's own choice of living situation not being something she is willing to discuss.   Her Jehovah's Witness faith came into this discussion, as she is the newest believer in the family, so she thinks her  husband, sister, and mother should all be provided an example for her rather than trying to get her to do things with them that she believes are not within their faith such as drinking.  We discussed "I" statements and cognitive distortions for her to take with her and return to discuss next session.  As she left she stated she thinks her husband is having an affair and would like to talk about that to start next time.  Suicidal/Homicidal: No  Therapist Response: Patient is making progress in feeling better  and increasing her insight into her illnesses.  She is open to feedback and to psychoeducation.  She appeared willing to consider boundaries with sister and mother although she is deeply entrenched in habits and may take a lot of support to achieve this.    Recommendations:  Return to therapy in 2 weeks, engage in self care behaviors, plan positive social engagements, consider personal boundaries, implement 1 new coping skill ("I" statements, cognitive distortions, boundaries) as taught in session, and return to next session prepared to talk about experience with that new coping method.   Plan: Return again in 2 weeks.  Diagnosis:  Anxiety disorder, unspecified type  Depression, unspecified depression type   Collaboration of Care: Psychiatrist AEB - referred for a medication evaluation  Patient/Guardian was advised Release of Information must be obtained prior to any record release in order to collaborate their care with an outside provider. Patient/Guardian was advised if they have not already done so to contact the registration department to sign all necessary forms in order for [place] to release information regarding their care.   Consent: Patient/Guardian gives verbal consent for treatment and assignment of benefits for services provided during this visit. Patient/Guardian expressed understanding and agreed to proceed.   Maretta Los, LCSW 02/19/2023

## 2023-02-20 ENCOUNTER — Ambulatory Visit (INDEPENDENT_AMBULATORY_CARE_PROVIDER_SITE_OTHER): Payer: Medicare PPO | Admitting: Obstetrics and Gynecology

## 2023-02-20 ENCOUNTER — Encounter: Payer: Self-pay | Admitting: Obstetrics and Gynecology

## 2023-02-20 DIAGNOSIS — R82998 Other abnormal findings in urine: Secondary | ICD-10-CM | POA: Diagnosis not present

## 2023-02-20 DIAGNOSIS — N390 Urinary tract infection, site not specified: Secondary | ICD-10-CM | POA: Diagnosis not present

## 2023-02-20 DIAGNOSIS — R3989 Other symptoms and signs involving the genitourinary system: Secondary | ICD-10-CM

## 2023-02-20 DIAGNOSIS — N3281 Overactive bladder: Secondary | ICD-10-CM | POA: Diagnosis not present

## 2023-02-20 DIAGNOSIS — R350 Frequency of micturition: Secondary | ICD-10-CM

## 2023-02-20 LAB — POCT URINALYSIS DIPSTICK
Bilirubin, UA: NEGATIVE
Glucose, UA: NEGATIVE
Ketones, UA: NEGATIVE
Nitrite, UA: NEGATIVE
Protein, UA: POSITIVE — AB
Spec Grav, UA: >=1.03 — AB (ref 1.010–1.025)
Urobilinogen, UA: 0.2 E.U./dL
pH, UA: 6 (ref 5.0–8.0)

## 2023-02-20 MED ORDER — NITROFURANTOIN MONOHYD MACRO 100 MG PO CAPS: 100.0000 mg | ORAL_CAPSULE | Freq: Two times a day (BID) | ORAL | 0 refills | Status: AC

## 2023-02-20 MED ORDER — PHENAZOPYRIDINE HCL 95 MG PO TABS: 95.0000 mg | ORAL_TABLET | Freq: Three times a day (TID) | ORAL | 1 refills | Status: DC | PRN

## 2023-02-20 NOTE — Patient Instructions (Addendum)
Please take the antibiotic as prescribed for UTI. Take the Pyridium for the bladder irritation.   You did have blood, protein, and some white blood cells in your urine but there was not enough to send for culture.

## 2023-02-20 NOTE — Progress Notes (Signed)
[place] [facility] Return Visit  SUBJECTIVE  History of Present Illness: Paula [name] is a 62 y.o. female seen for pressure in the bladder and urinary frequency.   Patient reports she has a urine taken by her PCP but she was not treated for UTI.   She states she constantly feels like her bladder is full and that she is going frequently to the bathroom. Patient reports she feels like she has been going to the bathroom less since she started the Myrbetriq.      Past Medical History: Patient  has a past medical history of Anxiety, Chronic lower back pain, Conversion disorder, Depression, Diverticulitis, Expressive aphasia (11/15/2016), Fibromyalgia, GERD (gastroesophageal reflux disease), Heart murmur, High cholesterol, History of gastric ulcer, History of hiatal hernia, History of kidney stones, History of thyroid nodule, Hyperlipidemia, Hypertension, IBS (irritable bowel syndrome), Intermittent vertigo, Migraine, Obesity, OSA on CPAP, Osteoarthritis, Pneumonia (X 1), Psychogenic tremor, Refusal of blood transfusions as patient is Jehovah's Witness, RLS (restless legs syndrome), Sleep apnea, Urinary incontinence, and Vertigo.   Past Surgical History: She  has a past surgical history that includes Cesarean section (1977; 1982); Anterior cervical decomp/discectomy fusion (02/10/2004); Umbilical hernia repair (age 1); Cardiovascular stress test (2017); Cystoscopy with ureteroscopy and stent placement (Right, 06/07/2015); Holmium laser application (N/A, [id number]); Cystoscopy w/ retrogrades (Right, 06/07/2015); Upper gi endoscopy; Colonoscopy; Back surgery; Laparoscopic cholecystectomy (10/07/2000); Hernia repair; Abdominal hysterectomy (1990); Lipoma excision (01/2017); and Direct laryngoscopy (N/A, 10/29/2021).   Medications: She has a current medication list which includes the following prescription(s): nitrofurantoin (macrocrystal-monohydrate), phenazopyridine, acetaminophen, albuterol,  amlodipine, benzonatate, vitamin d-1000 max st, vitamin b 12, cyclobenzaprine, dexlansoprazole, diazepam, dicyclomine, repatha sureclick, ezetimibe, fluticasone, mirabegron er, pantoprazole, ozempic (0.25 or 0.5 mg/dose), symbicort, and vitamin e, and the following Facility-Administered Medications: sodium chloride.   Allergies: Patient is allergic to detrol [tolterodine], gabapentin, aspirin, contrast media [iodinated contrast media], imitrex [sumatriptan], oxycodone, milk-related compounds, rosuvastatin, vicodin hp [hydrocodone-acetaminophen], and avelox [moxifloxacin].   Social History: Patient  reports that she quit smoking about 14 years ago. Her smoking use included cigarettes. She has a 12.50 pack-year smoking history. She has never used smokeless tobacco. She reports current alcohol use. She reports that she does not use drugs.      OBJECTIVE     Physical Exam:  Gen: No apparent distress, A&O x 3.  Detailed Urogynecologic Evaluation:   Pessary removed as it may have been putting pressure on the urethra.    ASSESSMENT AND PLAN    Paula [name] is a 62 y.o. with:  1. Sensation of pressure in bladder area   2. Leukocytes in urine   3. Urinary frequency    Bladder scanned and PVR showed 10. Catheterization done to make sure there was complete bladder emptying and approximately 39ml of urine gotten.  POC Urine positive for large blood, small leukocytes, and protein. Not enough to send for culture, will treat as a UTI due to patients symptoms.  Macrobid 100mg  prescribed twice daily for 5 days and pyridium sent for pressure symptoms. Incontinence pessary removed as patient was feeling like she could not empty bladder.   Patient to follow up as needed.

## 2023-02-21 ENCOUNTER — Ambulatory Visit (HOSPITAL_BASED_OUTPATIENT_CLINIC_OR_DEPARTMENT_OTHER): Payer: Medicare PPO | Admitting: Physical Therapy

## 2023-02-24 ENCOUNTER — Ambulatory Visit (HOSPITAL_BASED_OUTPATIENT_CLINIC_OR_DEPARTMENT_OTHER): Payer: Medicare PPO

## 2023-02-26 ENCOUNTER — Encounter (HOSPITAL_COMMUNITY): Payer: Self-pay | Admitting: Clinical

## 2023-02-26 ENCOUNTER — Ambulatory Visit (INDEPENDENT_AMBULATORY_CARE_PROVIDER_SITE_OTHER): Payer: Medicare PPO | Admitting: Clinical

## 2023-02-26 ENCOUNTER — Ambulatory Visit (HOSPITAL_BASED_OUTPATIENT_CLINIC_OR_DEPARTMENT_OTHER): Payer: Medicare PPO | Admitting: Physical Therapy

## 2023-02-26 DIAGNOSIS — F449 Dissociative and conversion disorder, unspecified: Secondary | ICD-10-CM | POA: Diagnosis not present

## 2023-02-26 DIAGNOSIS — F321 Major depressive disorder, single episode, moderate: Secondary | ICD-10-CM

## 2023-02-26 DIAGNOSIS — F419 Anxiety disorder, unspecified: Secondary | ICD-10-CM

## 2023-02-26 NOTE — Progress Notes (Signed)
THERAPIST PROGRESS NOTE  Session Time: 10:07-11:07am  Session #3  Participation Level: Active  Behavioral Response: Neat and Well Groomed Alert Euthymic and Hopeful  Type of Therapy: Individual Therapy  Treatment Goals addressed:  LTG: [name] will score less than 5 on the Generalized Anxiety Disorder 7 Scale (GAD-7)    STG: [name] will reduce frequency of avoidant behaviors by 50% as evidenced by self-report in therapy sessions  LTG: Reduce frequency, intensity, and duration of depression symptoms so that daily functioning is improved   STG: Reduce overall depression score to no higher than 9 on the Patient Health Questionnaire (PHQ-9)    ProgressTowards Goals: Progressing  Interventions: CBT and Supportive  Summary: Paula [name] is a 62 y.o. female who presents for therapy because her doctors feel she has a Conversion Disorder and has not worked out some of her issues which are now coming out physically.  She had shared at the end of last session that she wanted to talk about her husband, so we started with this.  She shared once again that in 12 years of marriage, there has been no physical affection or intimacy; in fact, the marriage has never been consummated.  In the early months of this year, there has been a female at their place of worship who has been kind to her, hugged her, and offered attention.  She felt she was getting close to wanting to meet him and start an individual relationship with him and rather than do this, she confessed this urge to her husband.  Since that time 30-45 days ago, her husband has become nicer to her, not yelling at her and actually taking care of his hygiene.  He will tell other people how much she takes care of him and in general has removed previous negative behaviors.  He has not become more affectionate or physical with her.  She still considers these to be positive steps although small and is thankful.  When asked, she was able to remember that  she directly informed her husband about the things that bother him and therefore he was able to make changes.    CSW provided psychoeducation about CBT and how thoughts affect feelings and actions.  CSW provided Thinking Traps handout, added to it "shoulding" as another cognitive distortion.  Based on things she said about recent interactions with her husband, mother, and sister, CSW gently pointed out cognitive distortions being used.  She was surprised to recognize that she is setting expectations for their behaviors based on what she thinks without regard to whether they think the same way or whether they know what she is thinking.  She stated "I'm setting myself up for failure, and them up for failure.  I'm sabotaging it."  The remainder of the session was spent in discussing her various interactions, what cognitive distortion she may have been using, the actual result, and the possible result if she used a more helpful thought and "I" statements to ask for what she wants/needs.    We also discussed what lies within her control and outside her control, using a pictorial on the computer.  This was tied in to how to challenge her thoughts.  She stated that these two things are what she will take with her from this session, to examine her thinking to see if she is making assumptions about others and to think about those things that are causing her negative feelings and determine if they are within her control or not.  Suicidal/Homicidal: No  Therapist Response: Patient is making progress in feeling better and increasing her insight into her illnesses.  She is open to feedback and to psychoeducation.  She wanted to know CSW's thoughts about her and whether she was "weird" so was told that she is very normal and human, was provided with positive strokes for being willing to look at an ingrained habit with the possibility of being willing to make a change.  She was provided with unconditional positive regard  and was allowed to express her thoughts and feelings while being gently challenged to consider another viewpoint.  She was given the home practice assignment of continuing to check her thoughts through a lens of whether it is one of the distortions on the list given to her and was asked if it is, to try to think of a more helpful thought as a replacement.  Recommendations:  Return to therapy in 1 week, engage in self care behaviors, plan positive social engagements, examine thoughts for cognitive distortion as taught in session, return to next session prepared to talk about what she observes   Plan: Return again in  week  Diagnosis:  Current moderate episode of major depressive disorder, unspecified whether recurrent ([place])  Recurrent conversion disorder  Anxiety disorder, unspecified type   Collaboration of Care: Psychiatrist AEB - referred for a medication evaluation, psychiatric provider can read therapy notes  Patient/Guardian was advised Release of Information must be obtained prior to any record release in order to collaborate their care with an outside provider. Patient/Guardian was advised if they have not already done so to contact the registration department to sign all necessary forms in order for [place] to release information regarding their care.   Consent: Patient/Guardian gives verbal consent for treatment and assignment of benefits for services provided during this visit. Patient/Guardian expressed understanding and agreed to proceed.   Maretta Los, LCSW 02/26/2023

## 2023-02-27 ENCOUNTER — Ambulatory Visit (INDEPENDENT_AMBULATORY_CARE_PROVIDER_SITE_OTHER): Payer: Medicare PPO

## 2023-02-27 DIAGNOSIS — R350 Frequency of micturition: Secondary | ICD-10-CM | POA: Diagnosis not present

## 2023-02-27 LAB — POCT URINALYSIS DIPSTICK
Glucose, UA: NEGATIVE
Ketones, UA: NEGATIVE
Leukocytes, UA: NEGATIVE
Nitrite, UA: NEGATIVE
Protein, UA: POSITIVE — AB
Spec Grav, UA: >=1.03 — AB (ref 1.010–1.025)
Urobilinogen, UA: 0.2 E.U./dL
pH, UA: 6 (ref 5.0–8.0)

## 2023-02-27 NOTE — Progress Notes (Signed)
Paula [name] is a 62 y.o. female arrived today with UTI sx.  A urine specimen was collected and POCT Urine was done. POCT Urine was Negative

## 2023-02-27 NOTE — Patient Instructions (Addendum)
Your Urine dip that was done in office was Negative. You can take AZO over the counter for your discomfort.  We have also ordered pyridium for you to take while we wait for your culture results, hopefully this gives you some relief. We will contact you when the results are back between 3-5 days. If a different antibiotic is needed we will sent the order to the pharmacy and you will be notified. If you have any questions or concerns please feel free to call us at [phone]

## 2023-03-05 ENCOUNTER — Encounter (HOSPITAL_COMMUNITY): Payer: Self-pay | Admitting: Clinical

## 2023-03-05 ENCOUNTER — Ambulatory Visit (INDEPENDENT_AMBULATORY_CARE_PROVIDER_SITE_OTHER): Payer: Medicare PPO | Admitting: Clinical

## 2023-03-05 DIAGNOSIS — K580 Irritable bowel syndrome with diarrhea: Secondary | ICD-10-CM | POA: Diagnosis not present

## 2023-03-05 DIAGNOSIS — F449 Dissociative and conversion disorder, unspecified: Secondary | ICD-10-CM | POA: Diagnosis not present

## 2023-03-05 DIAGNOSIS — Z6841 Body Mass Index (BMI) 40.0 and over, adult: Secondary | ICD-10-CM | POA: Diagnosis not present

## 2023-03-05 DIAGNOSIS — F419 Anxiety disorder, unspecified: Secondary | ICD-10-CM

## 2023-03-05 DIAGNOSIS — R7303 Prediabetes: Secondary | ICD-10-CM | POA: Diagnosis not present

## 2023-03-05 DIAGNOSIS — F321 Major depressive disorder, single episode, moderate: Secondary | ICD-10-CM

## 2023-03-05 DIAGNOSIS — I10 Essential (primary) hypertension: Secondary | ICD-10-CM | POA: Diagnosis not present

## 2023-03-05 NOTE — Progress Notes (Signed)
THERAPIST PROGRESS NOTE  Session Time: 10:04-11:03am  Session #4  Participation Level: Active  Behavioral Response: Neat and Well Groomed Alert Negative, Angry, and Hopeless  Type of Therapy: Individual Therapy  Treatment Goals addressed:  LTG: Paula [name] will score less than 5 on the Generalized Anxiety Disorder 7 Scale (GAD-7)    STG: Paula [name] will reduce frequency of avoidant behaviors by 50% as evidenced by self-report in therapy sessions  LTG: Reduce frequency, intensity, and duration of depression symptoms so that daily functioning is improved   STG: Reduce overall depression score to no higher than 9 on the Patient Health Questionnaire (PHQ-9)    ProgressTowards Goals: Progressing  Interventions: CBT and Supportive  Summary: Paula [name] is a 62 y.o. female who presents for therapy because her doctors feel she has a Conversion Disorder and has not worked out some of her issues which are now coming out physically.  She continues to feel a little better about her husband's efforts to take care of his hygiene.  One day she asked him to cook and he did so.  They just went to [place] for the weekend to see his children, grandchildren, and great-grandchildren.  She stated being in his daughter's house is not comfortable because they do things she would not choose for her house.  She was irritated that they had to spend $400 on a hotel instead of being offered a place to stay at the home, saying that when his family comes to visit they always stay at their house, not in a hotel.  In June his children are planning to come see him for Father's Day but she does not want them coming and/or staying at her house.  The kids and grand kids always stay with patient and her husband when they come into town and she does not feel this is a fair trade since they have to go to a hotel when the positions are reversed.  They also "bring their habits" with them which are in opposition to her own standards and  boundaries, with marijuana being smoked and opposite sex friends sharing a room.  She believes his family looks at her as being snobbish but she feels it is only that she has morals.  She said that she can tell her husband in advance about boundaries that need to be in place for the visitations, and he agrees with them, but then he gives in easily to whatever they do once they arrive.  This led to patient talking at length about all the things that her husband should do that he does not do, saying that every day she has to explain things all over to him.  She called him "lazy" and said he only does what he wants such as go to the gym and work in the yard, but he will not do what he "should."  When asked what she would really like from him, she replied she wants him to "understand I'm going through stuff because of him, dealing with this anxiety because of him."  She is not sure she wants to remain married and does not feel she is asking for too much when she wants him to do what "he should know to do."  She stated she cannot really afford to leave him but she does not like feeling as thought she is merely the nursemaid and housekeeper.  As CSW attempted to point out all-or-nothing thing, blaming, and other cognitive distortions, she was not open to this.  CSW mentioned that she seemed to be really resentful of her husband and she was shocked.  CSW asked if she would consider starting to journal about her frustration and thoughts.  She did not commit but did say she will consider it.  She was able to agree that husband likely feels he only hears corrections from her and that this could make him unwilling to engage with her.  But she is particularly upset about his constant use of his phone.  Suicidal/Homicidal: No  Therapist Response: Patient is making progress in expressing herself and her feelings, but she is making no progress in gaining insight into her own cognitive distortions.  She refuses to come out  and say to husband the things he "should know."  Therapist reviewed once again the impact that "I" statements can have on easing a conversation into a less confrontational  stance, but she was sufficiently irritated while talking about all her many grievances about her husband that this was not something she could consider right at that moment.  CSW provided mood monitoring and treatment progress review in the context of this episode of treatment.   Patient reported that her mood has been "peaceful" although her description did not sound peaceful.   Patient was able to explore how she feels about her husband and his family.  CSW gave patient the opportunity to explore thoughts and feelings associated with current life situations and past/present external stressors as desired.   CSW encouraged patient's expression of feelings and validated patient's thoughts, using empathy, active listening, open body language, and unconditional positive regard.  Patient demonstrated an orientation to time, place, person and situation.      Recommendations:  Return to therapy in 1 week, engage in self care behaviors, plan positive social engagements, examine thoughts for cognitive distortion as taught in session, return to next session prepared to talk about what she observes   Plan: Return again in  week  Diagnosis:  Current moderate episode of major depressive disorder, unspecified whether recurrent  Recurrent conversion disorder  Anxiety disorder, unspecified type   Collaboration of Care: Psychiatrist AEB - previously referred for a medication evaluation and has an appointment 4/20, psychiatric provider can read therapy notes  Patient/Guardian was advised Release of Information must be obtained prior to any record release in order to collaborate their care with an outside provider. Patient/Guardian was advised if they have not already done so to contact the registration department to sign all necessary forms in  order for [place] to release information regarding their care.   Consent: Patient/Guardian gives verbal consent for treatment and assignment of benefits for services provided during this visit. Patient/Guardian expressed understanding and agreed to proceed.   Maretta Los, LCSW 03/05/2023

## 2023-03-06 ENCOUNTER — Encounter: Payer: Self-pay | Admitting: Obstetrics and Gynecology

## 2023-03-06 ENCOUNTER — Ambulatory Visit (INDEPENDENT_AMBULATORY_CARE_PROVIDER_SITE_OTHER): Payer: Medicare PPO | Admitting: Obstetrics and Gynecology

## 2023-03-06 VITALS — BP 137/88 | HR 99

## 2023-03-06 DIAGNOSIS — N393 Stress incontinence (female) (male): Secondary | ICD-10-CM | POA: Diagnosis not present

## 2023-03-06 DIAGNOSIS — N3281 Overactive bladder: Secondary | ICD-10-CM

## 2023-03-06 DIAGNOSIS — R350 Frequency of micturition: Secondary | ICD-10-CM

## 2023-03-06 LAB — POCT URINALYSIS DIPSTICK
Bilirubin, UA: NEGATIVE
Glucose, UA: NEGATIVE
Ketones, UA: NEGATIVE
Leukocytes, UA: NEGATIVE
Nitrite, UA: NEGATIVE
Protein, UA: NEGATIVE
Spec Grav, UA: >=1.03 — AB (ref 1.010–1.025)
Urobilinogen, UA: 0.2 E.U./dL
pH, UA: 6 (ref 5.0–8.0)

## 2023-03-06 MED ORDER — LIDOCAINE-EPINEPHRINE 1 %-1:100000 IJ SOLN
6.0000 mL | Freq: Once | INTRAMUSCULAR | Status: AC
  Administered 2023-03-06: 6 mL

## 2023-03-06 MED ORDER — LIDOCAINE HCL URETHRAL/MUCOSAL 2 % EX GEL
1.0000 | Freq: Once | CUTANEOUS | Status: AC
  Administered 2023-03-06: 1 via URETHRAL

## 2023-03-06 NOTE — Patient Instructions (Signed)
Taking Care of Yourself after Urodynamics, Cystoscopy, Bulkamid Injection, or Botox Injection   Drink plenty of water for a day or two following your procedure. Try to have about 8 ounces (one cup) at a time, and do this 6 times or more per day unless you have fluid restrictitons AVOID irritative beverages such as coffee, tea, soda, alcoholic or citrus drinks for a day or two, as this may cause burning with urination.  For the first 1-2 days after the procedure, your urine may be pink or red in color. You may have some blood in your urine as a normal side effect of the procedure. Large amounts of bleeding or difficulty urinating are NOT normal. Call the nurse line if this happens or go to the nearest Emergency Room if the bleeding is heavy or you cannot urinate at all and it is after hours. If you had a Bulkamid injection in the urethra and need to be catheterized, ask for a pediatric catheter to be used (size 10 or 12-French) so the material is not pushed out of place.   You may experience some discomfort or a burning sensation with urination after having this procedure. You can use over the counter Azo or pyridium to help with burning and follow the instructions on the packaging. If it does not improve within 1-2 days, or other symptoms appear (fever, chills, or difficulty urinating) call the office to speak to a nurse.  You may return to normal daily activities such as work, school, driving, exercising and housework on the day of the procedure. If your doctor gave you a prescription, take it as ordered.     

## 2023-03-06 NOTE — Progress Notes (Signed)
[facility] Return Visit  SUBJECTIVE  History of Present Illness: Paula [name] is a 62 y.o. female seen in follow-up for mixed incontinence.   She is planning today for the bulkamid injection for her SUI symptoms.   She has been taking the Myrbetriq 25mg . She feels that the medication helps a bit with the urgency but she is still wearing pads and hard to tell if the leakage has really improved.    Past Medical History: Patient  has a past medical history of Anxiety, Chronic lower back pain, Conversion disorder, Depression, Diverticulitis, Expressive aphasia (11/15/2016), Fibromyalgia, GERD (gastroesophageal reflux disease), Heart murmur, High cholesterol, History of gastric ulcer, History of hiatal hernia, History of kidney stones, History of thyroid nodule, Hyperlipidemia, Hypertension, IBS (irritable bowel syndrome), Intermittent vertigo, Migraine, Obesity, OSA on CPAP, Osteoarthritis, Pneumonia (X 1), Psychogenic tremor, Refusal of blood transfusions as patient is Jehovah's Witness, RLS (restless legs syndrome), Sleep apnea, Urinary incontinence, and Vertigo.   Past Surgical History: She  has a past surgical history that includes Cesarean section (1977; 1982); Anterior cervical decomp/discectomy fusion (02/10/2004); Umbilical hernia repair (age 66); Cardiovascular stress test (2017); Cystoscopy with ureteroscopy and stent placement (Right, 06/07/2015); Holmium laser application (N/A, [id number]); Cystoscopy w/ retrogrades (Right, 06/07/2015); Upper gi endoscopy; Colonoscopy; Back surgery; Laparoscopic cholecystectomy (10/07/2000); Hernia repair; Abdominal hysterectomy (1990); Lipoma excision (01/2017); and Direct laryngoscopy (N/A, 10/29/2021).   Medications: She has a current medication list which includes the following prescription(s): acetaminophen, albuterol, amlodipine, benzonatate, vitamin d-1000 max st, vitamin b 12, cyclobenzaprine, dexlansoprazole, diazepam,  dicyclomine, repatha sureclick, ezetimibe, fluticasone, mirabegron er, pantoprazole, phenazopyridine, ozempic (0.25 or 0.5 mg/dose), symbicort, and vitamin e, and the following Facility-Administered Medications: sodium chloride.   Allergies: Patient is allergic to detrol [tolterodine], gabapentin, aspirin, contrast media [iodinated contrast media], imitrex [sumatriptan], oxycodone, milk-related compounds, rosuvastatin, vicodin hp [hydrocodone-acetaminophen], and avelox [moxifloxacin].   Social History: Patient  reports that she quit smoking about 14 years ago. Her smoking use included cigarettes. She has a 12.50 pack-year smoking history. She has never used smokeless tobacco. She reports current alcohol use. She reports that she does not use drugs.      OBJECTIVE     Physical Exam: Vitals:   03/06/23 1016  BP: (!) 141/71  Pulse: 88   Gen: No apparent distress, A&O x 3.  Bulkamid Injection  Patient signed her consent form.  She started antibiotic prophylaxis today.  Procedure: Time out was performed. The bladder was catheterized and 10 ml of 2% lidocaine jelly placed in the urethra. A urethral block was performed by injecting 9ml of 1% lidocaine with epinephrine at 3 and 6 o'clock adjacent to the urethra.  The needle was primed.  The cystoscope was inserted to the level of the bladder neck.  The needle was inserted 2 cm and the scope was pulled back into the urethra 2 cm.  The needle was inserted bevel up at the 5 o'clock position and 0.5cc of the Bulkamid was injected to obtain coaptation.  At this time, patient began shaking and felt panicked. The cystoscope was immediately removed. Decision was made to not continue with the remainder of the procedure. Repeat vitals were obtained and within normal limits.    ASSESSMENT/ PLAN: Unable to complete urethral bulking procedure today due to patient intolerance of the procedure.      ASSESSMENT AND PLAN    Paula [name] is a 62 y.o. with:   1. SUI (stress urinary incontinence, female)   2. Overactive bladder  3. Urinary frequency     - Unable to tolerate bulkamid injection in the office- only able to inject 0.5cc before the procedure was terminated. - We discussed that she can repeat the bulkamid injection in the operating room, or if she wants a more permanent procedure can plan for midurethral sling procedure. She prefers a more permanent procedure if she is going to have anesthesia, so will plan for the sling. Handout provided to her with information on the procedure.  - We discussed she should continue with the Myrbetriq 25mg  since it is helping some with her symptoms. We also reviewed that she will likely need to continue with the medication after the procedure is complete.   Plan for surgery: Exam under anesthesia, midurethral sling, cystoscopy  - We reviewed the patient's specific anatomic and functional findings, with the assistance of diagrams, and together finalized the above procedure. The planned surgical procedures were discussed along with the surgical risks outlined below, which were also provided on a detailed handout. Additional treatment options including expectant management, conservative management, medical management were discussed where appropriate.  We reviewed the benefits and risks of each treatment option.   General Surgical Risks: For all procedures, there are risks of bleeding, infection, damage to surrounding organs including but not limited to bowel, bladder, blood vessels, ureters and nerves, and need for further surgery if an injury were to occur. These risks are all low with minimally invasive surgery.   There are risks of numbness and weakness at any body site or buttock/rectal pain.  It is possible that baseline pain can be worsened by surgery, either with or without mesh. If surgery is vaginal, there is also a low risk of possible conversion to laparoscopy or open abdominal incision where indicated.  Very rare risks include blood transfusion, blood clot, heart attack, pneumonia, or death.   There is also a risk of short-term postoperative urinary retention with need to use a catheter. About half of patients need to go home from surgery with a catheter, which is then later removed in the office. The risk of long-term need for a catheter is very low. There is also a risk of worsening of overactive bladder.   Sling: The effectiveness of a midurethral vaginal mesh sling is approximately 85%, and thus, there will be times when you may leak urine after surgery, especially if your bladder is full or if you have a strong cough. There is a balance between making the sling tight enough to treat your leakage but not too tight so that you have long-term difficulty emptying your bladder. A mesh sling will not directly treat overactive bladder/urge incontinence and may worsen it.  There is an FDA safety notification on vaginal mesh procedures for prolapse but NOT mesh slings. We have extensive experience and training with mesh placement and we have close postoperative follow up to identify any potential complications from mesh. It is important to realize that this mesh is a permanent implant that cannot be easily removed. There are rare risks of mesh exposure (2-4%), pain with intercourse (0-7%), and infection (<1%). The risk of mesh exposure if more likely in a woman with risks for poor healing (prior radiation, poorly controlled diabetes, or immunocompromised). The risk of new or worsened chronic pain after mesh implant is more common in women with baseline chronic pain and/or poorly controlled anxiety or depression. Approximately 2-4% of patients will experience longer-term post-operative voiding dysfunction that may require surgical revision of the sling. We also reviewed that  postoperatively, her stream may not be as strong as before surgery.   - For preop Visit:  She is required to have a visit within 30 days of  her surgery.   - Medical clearance: not required  - Anticoagulant use: No - Medicaid Hysterectomy form: n/a - Accepts blood transfusion: No- she is a Jehovah's witness - Expected length of stay: outpatient  Request sent for surgery scheduling.    Jaquita Folds, MD

## 2023-03-10 ENCOUNTER — Encounter (HOSPITAL_BASED_OUTPATIENT_CLINIC_OR_DEPARTMENT_OTHER): Payer: Self-pay

## 2023-03-10 ENCOUNTER — Ambulatory Visit (HOSPITAL_BASED_OUTPATIENT_CLINIC_OR_DEPARTMENT_OTHER): Payer: Medicare PPO

## 2023-03-13 ENCOUNTER — Ambulatory Visit (HOSPITAL_BASED_OUTPATIENT_CLINIC_OR_DEPARTMENT_OTHER): Payer: Medicare PPO | Admitting: Physical Therapy

## 2023-03-18 NOTE — Progress Notes (Unsigned)
 Urogynecology   Subjective:     Chief Complaint: No chief complaint on file.  History of Present Illness: MARCHELLA HIBBARD is a 62 y.o. female with {PFD symptoms:24771} who presents for a pessary check. She is using a size *** {pessary type:24772} pessary. The pessary has been working well and she has no complaints. She {ACTION; IS/IS ZOX:[id number]} using vaginal estrogen. She denies vaginal bleeding.  Past Medical History: Patient  has a past medical history of Anxiety, Chronic lower back pain, Conversion disorder, Depression, Diverticulitis, Expressive aphasia (11/15/2016), Fibromyalgia, GERD (gastroesophageal reflux disease), Heart murmur, High cholesterol, History of gastric ulcer, History of hiatal hernia, History of kidney stones, History of thyroid nodule, Hyperlipidemia, Hypertension, IBS (irritable bowel syndrome), Intermittent vertigo, Migraine, Obesity, OSA on CPAP, Osteoarthritis, Pneumonia (X 1), Psychogenic tremor, Refusal of blood transfusions as patient is Jehovah's Witness, RLS (restless legs syndrome), Sleep apnea, Urinary incontinence, and Vertigo.   Past Surgical History: She  has a past surgical history that includes Cesarean section (1977; 1982); Anterior cervical decomp/discectomy fusion (02/10/2004); Umbilical hernia repair (age 34); Cardiovascular stress test (2017); Cystoscopy with ureteroscopy and stent placement (Right, 06/07/2015); Holmium laser application (N/A, 06/07/2015); Cystoscopy w/ retrogrades (Right, 06/07/2015); Upper gi endoscopy; Colonoscopy; Back surgery; Laparoscopic cholecystectomy (10/07/2000); Hernia repair; Abdominal hysterectomy (1990); Lipoma excision (01/2017); and Direct laryngoscopy (N/A, 10/29/2021).   Medications: She has a current medication list which includes the following prescription(s): acetaminophen, albuterol, amlodipine, benzonatate, vitamin d-1000 max st, vitamin b 12, cyclobenzaprine, dexlansoprazole, diazepam, dicyclomine,  repatha sureclick, ezetimibe, fluticasone, mirabegron er, pantoprazole, phenazopyridine, ozempic (0.25 or 0.5 mg/dose), symbicort, and vitamin e, and the following Facility-Administered Medications: sodium chloride.   Allergies: Patient is allergic to detrol [tolterodine], gabapentin, aspirin, contrast media [iodinated contrast media], imitrex [sumatriptan], oxycodone, milk-related compounds, rosuvastatin, vicodin hp [hydrocodone-acetaminophen], and avelox [moxifloxacin].   Social History: Patient  reports that she quit smoking about 14 years ago. Her smoking use included cigarettes. She has a 12.50 pack-year smoking history. She has never used smokeless tobacco. She reports current alcohol use. She reports that she does not use drugs.      Objective:    Physical Exam: There were no vitals taken for this visit. Gen: No apparent distress, A&O x 3. Detailed Urogynecologic Evaluation:  Pelvic Exam: Normal external female genitalia; Bartholin's and Skene's glands normal in appearance; urethral meatus {urethra:24773}, no urethral masses or discharge. The pessary was noted to be {in place:24774}. It was removed and cleaned. Speculum exam revealed {vaginal lesions:24775} in the vagina. The pessary was replaced. It was comfortable to the patient and fit well.       No data to display          Laboratory Results: Urine dipstick shows: {ua dip:315374::"negative for all components"}.    Assessment/Plan:    Assessment: Ms. Poinsett is a 62 y.o. with {PFD symptoms:24771} here for a pessary check. She is doing well.  Plan: She will {pessary plan:24776}. She will continue to use {lubricant:24777}. She will follow-up in *** {days/wks/mos/yrs:310907} for a pessary check or sooner as needed.  All questions were answered.   Time Spent:

## 2023-03-19 DIAGNOSIS — G4733 Obstructive sleep apnea (adult) (pediatric): Secondary | ICD-10-CM | POA: Diagnosis not present

## 2023-03-20 ENCOUNTER — Encounter: Payer: Self-pay | Admitting: Obstetrics and Gynecology

## 2023-03-20 ENCOUNTER — Ambulatory Visit (INDEPENDENT_AMBULATORY_CARE_PROVIDER_SITE_OTHER): Payer: Medicare PPO | Admitting: Obstetrics and Gynecology

## 2023-03-20 VITALS — BP 132/77 | HR 66

## 2023-03-20 DIAGNOSIS — N393 Stress incontinence (female) (male): Secondary | ICD-10-CM | POA: Diagnosis not present

## 2023-03-20 DIAGNOSIS — N952 Postmenopausal atrophic vaginitis: Secondary | ICD-10-CM

## 2023-03-20 MED ORDER — ESTRADIOL 0.1 MG/GM VA CREA: 0.5000 g | TOPICAL_CREAM | VAGINAL | 11 refills | Status: DC

## 2023-03-20 NOTE — Patient Instructions (Addendum)
Please use the estrogen cream a few times a week. Put a blueberry sized amount onto your finger and place into the vagina and around the urethra.   If the estrogen is too expensive you can do lubrication a few types a week.

## 2023-03-22 ENCOUNTER — Encounter (HOSPITAL_COMMUNITY): Payer: Self-pay | Admitting: Psychiatry

## 2023-03-22 ENCOUNTER — Ambulatory Visit (HOSPITAL_COMMUNITY): Payer: Medicare PPO | Admitting: Psychiatry

## 2023-03-22 DIAGNOSIS — F411 Generalized anxiety disorder: Secondary | ICD-10-CM | POA: Diagnosis not present

## 2023-03-22 DIAGNOSIS — F449 Dissociative and conversion disorder, unspecified: Secondary | ICD-10-CM | POA: Diagnosis not present
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
## 2023-03-25 DIAGNOSIS — G4733 Obstructive sleep apnea (adult) (pediatric): Secondary | ICD-10-CM | POA: Diagnosis not present

## 2023-04-02 DIAGNOSIS — K580 Irritable bowel syndrome with diarrhea: Secondary | ICD-10-CM | POA: Diagnosis not present

## 2023-04-02 DIAGNOSIS — I10 Essential (primary) hypertension: Secondary | ICD-10-CM | POA: Diagnosis not present

## 2023-04-02 DIAGNOSIS — Z6841 Body Mass Index (BMI) 40.0 and over, adult: Secondary | ICD-10-CM | POA: Diagnosis not present

## 2023-04-02 DIAGNOSIS — F419 Anxiety disorder, unspecified: Secondary | ICD-10-CM | POA: Diagnosis not present

## 2023-04-02 DIAGNOSIS — R7303 Prediabetes: Secondary | ICD-10-CM | POA: Diagnosis not present

## 2023-04-07 ENCOUNTER — Encounter (HOSPITAL_COMMUNITY): Payer: Self-pay

## 2023-04-11 ENCOUNTER — Ambulatory Visit (HOSPITAL_COMMUNITY): Admitting: Clinical

## 2023-04-15 DIAGNOSIS — R490 Dysphonia: Secondary | ICD-10-CM | POA: Diagnosis not present

## 2023-04-15 DIAGNOSIS — J387 Other diseases of larynx: Secondary | ICD-10-CM | POA: Diagnosis not present

## 2023-04-15 DIAGNOSIS — J385 Laryngeal spasm: Secondary | ICD-10-CM | POA: Diagnosis not present

## 2023-04-15 DIAGNOSIS — Z87891 Personal history of nicotine dependence: Secondary | ICD-10-CM | POA: Diagnosis not present

## 2023-04-18 DIAGNOSIS — G4733 Obstructive sleep apnea (adult) (pediatric): Secondary | ICD-10-CM | POA: Diagnosis not present

## 2023-04-21 ENCOUNTER — Telehealth: Payer: Self-pay | Admitting: Obstetrics and Gynecology

## 2023-04-21 NOTE — Telephone Encounter (Signed)
Patient called in today and said that she does not want to proceed with scheduling a sling at this time.  (May be on Kim list to schedule) Scheduled 4 month pessary check in August. She does not need a call back.  Just an FYI.

## 2023-04-25 ENCOUNTER — Ambulatory Visit (HOSPITAL_COMMUNITY): Admitting: Clinical

## 2023-05-05 DIAGNOSIS — K580 Irritable bowel syndrome with diarrhea: Secondary | ICD-10-CM | POA: Diagnosis not present

## 2023-05-05 DIAGNOSIS — R7303 Prediabetes: Secondary | ICD-10-CM | POA: Diagnosis not present

## 2023-05-05 DIAGNOSIS — Z6841 Body Mass Index (BMI) 40.0 and over, adult: Secondary | ICD-10-CM | POA: Diagnosis not present

## 2023-05-05 DIAGNOSIS — I10 Essential (primary) hypertension: Secondary | ICD-10-CM | POA: Diagnosis not present

## 2023-05-05 DIAGNOSIS — F419 Anxiety disorder, unspecified: Secondary | ICD-10-CM | POA: Diagnosis not present

## 2023-05-06 ENCOUNTER — Telehealth: Payer: Self-pay | Admitting: Physician Assistant

## 2023-05-06 NOTE — Telephone Encounter (Signed)
Inbound call from patient requesting a call back regarding recent mychart message. She would like to know if any medication or ointment could be prescribed to help. Please advise, thank you.

## 2023-05-06 NOTE — Telephone Encounter (Signed)
This has been addressed. Please see 6/4 patient message for details.

## 2023-05-09 ENCOUNTER — Ambulatory Visit (INDEPENDENT_AMBULATORY_CARE_PROVIDER_SITE_OTHER): Payer: Medicare PPO | Admitting: Clinical

## 2023-05-09 ENCOUNTER — Encounter (HOSPITAL_COMMUNITY): Payer: Self-pay | Admitting: Clinical

## 2023-05-09 DIAGNOSIS — F411 Generalized anxiety disorder: Secondary | ICD-10-CM | POA: Diagnosis not present

## 2023-05-09 DIAGNOSIS — F321 Major depressive disorder, single episode, moderate: Secondary | ICD-10-CM | POA: Diagnosis not present

## 2023-05-09 NOTE — Progress Notes (Signed)
THERAPIST PROGRESS NOTE  Session Time: 9:00-10:00am  Session #5  Participation Level: Active  Behavioral Response: Neat and Well Groomed Alert Negative, Euthymic, and Hopeless  Type of Therapy: Individual Therapy  Treatment Goals addressed:  LTG: Paula [name] will score less than 5 on the Generalized Anxiety Disorder 7 Scale (GAD-7)    STG: Paula [name] will reduce frequency of avoidant behaviors by 50% as evidenced by self-report in therapy sessions  LTG: Reduce frequency, intensity, and duration of depression symptoms so that daily functioning is improved   STG: Reduce overall depression score to no higher than 9 on the Patient Health Questionnaire (PHQ-9)    ProgressTowards Goals: Progressing  Interventions: Supportive and Reframing  Summary: Paula [name] is a 62 y.o. female who presents for therapy because her doctors feel she has a Conversion Disorder and has not worked out some of her issues which are now coming out physically.  She reported that she has been working on herself by being more involved in ministry and praying more.  She focused the entirety of the session on how irritating her mother's situation and how frustrating her relationship with her 75yo husband is.  CSW spent the entirety of the session attempting to reframe her thoughts in a more positive or detached manner.  She stated she knows she cannot control her mother's situation.  She agreed it might be beneficial for her to pray for the ability to let go of her negative feelings.  The concept of detachment was reviewed and discussed, especially through the lens of Al Anon literature.  She feels that 10% of her problem is anger about the fact that her mother chose to be at sister's house which is smaller, dirtier, less organized, and filled with people wanting to drink and party most of the time.  The other 90% of her problem is her husband who says no to every request she makes, refuses to take care of his hygiene, is  demanding rather than questioning, rolls his eyes at her frequently, and verbally aggressive.  She reiterated almost all the arguments and discontent items that she has reviewed previously, hardly seems to have progressed at all.  She feels that she would have peace of mind if she could financially afford to leave him.  She does not feel that complimenting him when he does things well teaches him to do that item more.  They [facility] point but it did not help because she felt it was mostly to tell wives how to handle their husbands' PTSD.  He goes to PTSD support groups but she feels he is extra grumpy when he comes home.  CSW explained that it may be bringing up hard things for him to deal with.  CSW asked if she would be willing to bring husband for some session to work on communication and she did not commit about whether she would consider it.  Suicidal/Homicidal: No  Therapist Response: Patient is progressing AEB engaging in scheduled therapy session.  She presented oriented x5 and stated she was feeling "good/okay."  CSW evaluated patient's medication compliance and self-care since last session.  CSW reviewed the last session with patient who reported that she feels she is working on herself, but the other people in her life are not doing the same thing, so she feels she is doing her part.  Patient was able to acknowledge during the session that letting go and detaching from the situation with mother is  the only way she can achieve peace.  She has already spoken to her daughter about not telling her things that are going on because she does not need to know everything, only what pertains to her such as mother's need to go shopping.  Patient finds fault with the way many things are done by mother, sister, brother-in-law, and husband, perhaps justifiably so.  .CSW spent time discussing the need for detachment, specifically how to accomplish this and  ways in which it can lead her to a more peaceful thought life,  CSW gave her 2 handouts from Al Anon that address the idea of detachment.  She was embarrassed and did not want the word said loudly so people would not think she is an alcoholic.  The purpose of Al Hel[place] Rima was once again explained.  Patient received these handouts willingly and could discuss them in a way to indicate fair comprehension.  CSW assigned patient the task of reading these items, working on detaching, and thinking about doing a few sessions to work on communication with husband.  CSW encouraged patient to schedule more therapy sessions for the future, as this was the last one scheduled.  Throughout the session, CSW gave patient the opportunity to explore thoughts and feelings associated with current life situations and past/present external stressors.   CSW encouraged patient's expression of feelings and validated patient's thoughts using empathy, active listening, open body language, and unconditional positive regard.       Recommendations:  Return to therapy in 1 week, engage in self care behaviors, plan positive social engagements, examine thoughts for cognitive distortion as taught in session, return to next session prepared to talk about what she observes   Plan: Return again as scheduling permits  Diagnosis:  Current moderate episode of major depressive disorder, unspecified whether recurrent (HCC)  GAD (generalized anxiety disorder)   Collaboration of Care: Psychiatrist AEB - previously referred for a medication evaluation and has an appointment 4/20, psychiatric provider can read therapy notes  Patient/Guardian was advised Release of Information must be obtained prior to any record release in order to collaborate their care with an outside provider. Patient/Guardian was advised if they have not already done so to contact the registration department to sign all necessary forms in order for [place] to release information  regarding their care.   Consent: Patient/Guardian gives verbal consent for treatment and assignment of benefits for services provided during this visit. Patient/Guardian expressed understanding and agreed to proceed.   Lynnell Chad, LCSW 05/09/2023

## 2023-05-19 DIAGNOSIS — G4733 Obstructive sleep apnea (adult) (pediatric): Secondary | ICD-10-CM | POA: Diagnosis not present

## 2023-05-21 DIAGNOSIS — J387 Other diseases of larynx: Secondary | ICD-10-CM | POA: Diagnosis not present

## 2023-05-21 DIAGNOSIS — J385 Laryngeal spasm: Secondary | ICD-10-CM | POA: Diagnosis not present

## 2023-05-21 DIAGNOSIS — R490 Dysphonia: Secondary | ICD-10-CM | POA: Diagnosis not present

## 2023-05-21 DIAGNOSIS — Z87891 Personal history of nicotine dependence: Secondary | ICD-10-CM | POA: Diagnosis not present

## 2023-05-27 ENCOUNTER — Encounter: Payer: Self-pay | Admitting: Physician Assistant

## 2023-05-27 ENCOUNTER — Ambulatory Visit (INDEPENDENT_AMBULATORY_CARE_PROVIDER_SITE_OTHER): Payer: Medicare PPO | Admitting: Physician Assistant

## 2023-05-27 VITALS — BP 134/70 | HR 68 | Ht 63.0 in | Wt 257.0 lb

## 2023-05-27 DIAGNOSIS — K644 Residual hemorrhoidal skin tags: Secondary | ICD-10-CM

## 2023-05-27 DIAGNOSIS — K219 Gastro-esophageal reflux disease without esophagitis: Secondary | ICD-10-CM

## 2023-05-27 MED ORDER — HYDROCORTISONE 1 % EX OINT: 1.0000 | TOPICAL_OINTMENT | Freq: Two times a day (BID) | CUTANEOUS | 0 refills | Status: DC

## 2023-05-27 NOTE — Progress Notes (Signed)
Noted  

## 2023-05-27 NOTE — Progress Notes (Signed)
Chief Complaint: Hemorrhoids  HPI:    Paula [name] is a 62 year old African-American female with past medical history as listed below including reflux, IBS and multiple others, known to Dr. Marina Goodell, who presents to clinic today with a complaint of hemorrhoids.    05/15/2021 EGD with reflux and otherwise normal.    05/15/2021 colonoscopy with diverticulosis and internal hemorrhoids and otherwise normal. Repeat recommended 10 years.     02/13/2022 was patient's last office visit to discuss noncardiac chest pain.  At that time discussed that she continue to work with the cardiology team in regards to her anginal symptoms.  She was given GI cocktail just in case.  Also continued on Dexilant 60 mg daily and Pepcid 20 mg at night.  Told to discontinue Pantoprazole.    06/19/2022 patient called and described abdominal pain in the middle of her stomach.  At that time described being diagnosed with a sinus infection and given a Z-Pak.  After using those antibiotics all of her symptoms started with pain and diarrhea.  At that time ordered a GI pathogen panel and prescribed her Dicyclomine 20 mg every 6 hours as needed.    06/20/2022 patient went to the ED for epigastric pain.  CBC with a white count of 13.3, CMP with a glucose of 125, lipase normal.  CT the abdomen pelvis without contrast with no acute intra-abdominal or pelvic pathology, colonic diverticulosis, fatty liver and aortic atherosclerosis.  Patient was given Zofran, IV Pepcid, Fentanyl and sent home.    06/22/2019 3 GI pathogen panel negative.  Patient reported doing better with Dicyclomine on 07/01/2022.    07/30/2022 office visit with me and at that time was doing much better as long as she took Dicyclomine 20 mg once a day.    Today, patient presents to clinic and tells me that she has had some trouble with hemorrhoids off and on in the past but "nothing like this", about a month ago she had what sounds like a thrombosed hemorrhoid with a hard spot and  tells me then that she got multiple other soft ones around it, she has used everything over-the-counter in order to help and feels like things are slightly, now, but wonders if there is anything else she can do.    Also briefly discusses her weight and some reflux.  She is currently on Dexilant and Pantoprazole.    Denies fever, chills or weight loss.  Past Medical History:  Diagnosis Date   Anxiety    pt stated anxiety episodes resemble stroke symptoms   Chronic lower back pain    Conversion disorder    Depression    Diverticulitis    Expressive aphasia 11/15/2016   Fibromyalgia    GERD (gastroesophageal reflux disease)    Heart murmur    High cholesterol    History of gastric ulcer    History of hiatal hernia    History of kidney stones    History of thyroid nodule    Hyperlipidemia    Hypertension    IBS (irritable bowel syndrome)    Intermittent vertigo    Migraine    "a few times/year now; maybe" (11/15/2016)   Obesity    OSA on CPAP    wears CPAP nightly ;study in epic 02-01-2014 (11/15/2016)   Osteoarthritis    "knees, hands, back, hips" (11/15/2016)   Pneumonia X 1   hx of   Psychogenic tremor    intermittant head tremor-(11/15/2016)   Refusal of blood transfusions as patient  is Jehovah's Witness    RLS (restless legs syndrome)    Sleep apnea    CPAP   Urinary incontinence    Vertigo     Past Surgical History:  Procedure Laterality Date   ABDOMINAL HYSTERECTOMY  1990   Partial, still has ovaries   ANTERIOR CERVICAL DECOMP/DISCECTOMY FUSION  02/10/2004   C4 -- C7   BACK SURGERY     CARDIOVASCULAR STRESS TEST  2017   Negative   CESAREAN SECTION  1977; 1982   COLONOSCOPY     CYSTOSCOPY W/ RETROGRADES Right 06/07/2015   Procedure: CYSTOSCOPY WITH RETROGRADE PYELOGRAM;  Surgeon: Crist Fat, MD;  Location[facility];  Service: Urology;  Laterality: Right;   CYSTOSCOPY WITH URETEROSCOPY AND STENT PLACEMENT Right 06/07/2015    Procedure: RIGHT URETEROSCOPY, LASER LITHOTRIPSY, STONE REMOVAL AND RIGHT URETERAL STENT PLACEMENT;  Surgeon: Crist Fat, MD;  Location: [facility];  Service: Urology;  Laterality: Right;   DIRECT LARYNGOSCOPY N/A 10/29/2021   Procedure: DIRECT LARYNGOSCOPY WITH BIOPSY;  Surgeon: Christia Reading, MD;  Location: [facility];  Service: ENT;  Laterality: N/A;   HERNIA REPAIR     HOLMIUM LASER APPLICATION N/A 06/07/2015   Procedure: HOLMIUM LASER APPLICATION;  Surgeon: Crist Fat, MD;  [facility];  Service: Urology;  Laterality: N/A;   LAPAROSCOPIC CHOLECYSTECTOMY  10/07/2000   LIPOMA EXCISION  01/2017   from neck   UMBILICAL HERNIA REPAIR  age 4   UPPER GI ENDOSCOPY      Current Outpatient Medications  Medication Sig Dispense Refill   acetaminophen (TYLENOL) 500 MG tablet Take 1,000 mg by mouth every 6 (six) hours as needed for headache (pain).     albuterol (VENTOLIN HFA) 108 (90 Base) MCG/ACT inhaler 2 puffs every 6 (six) hours as needed for shortness of breath or wheezing.     amLODipine (NORVASC) 5 MG tablet Take 1 tablet (5 mg total) by mouth daily. 90 tablet 3   benzonatate (TESSALON) 100 MG capsule Take 1 capsule (100 mg total) by mouth every 8 (eight) hours. 21 capsule 0   Cholecalciferol (VITAMIN D-1000 MAX ST) 25 MCG (1000 UT) tablet Take by mouth.     Cyanocobalamin (VITAMIN B 12) 500 MCG TABS 1 tablet     cyclobenzaprine (FLEXERIL) 10 MG tablet Take 1 tablet by mouth at bedtime as needed for muscle spasms.     dexlansoprazole (DEXILANT) 60 MG capsule Take 60 mg by mouth at bedtime.     dicyclomine (BENTYL) 20 MG tablet Take 1 tablet (20 mg total) by mouth 2 (two) times daily. 60 tablet 11   estradiol (ESTRACE) 0.1 MG/GM vaginal cream Place 0.5 g vaginally 2 (two) times a week. Place 0.5g nightly for two weeks then twice a week after 42.5 g 11   Evolocumab (REPATHA SURECLICK) 140 MG/ML SOAJ INJECT (140MG ) INTO THE SKIN EVERY 14  DAYS 2 mL 11   fluticasone (FLONASE) 50 MCG/ACT nasal spray Place 2 sprays into both nostrils as needed.     mirabegron ER (MYRBETRIQ) 25 MG TB24 tablet Take 1 tablet (25 mg total) by mouth daily. 30 tablet 5   pantoprazole (PROTONIX) 40 MG tablet Take by mouth.     phenazopyridine (PYRIDIUM) 95 MG tablet Take 1 tablet (95 mg total) by mouth 3 (three) times daily as needed for pain. 15 tablet 1   Semaglutide,0.25 or 0.5MG /DOS, (OZEMPIC, 0.25 OR 0.5 MG/DOSE,) 2 MG/1.5ML SOPN Inject 0.5 mg into the skin  once a week.     SYMBICORT 160-4.5 MCG/ACT inhaler as needed.     Vitamin E 670 MG (1000 UT) CAPS Take by mouth.     ezetimibe (ZETIA) 10 MG tablet Take 1 tablet (10 mg total) by mouth daily. (Patient not taking: Reported on 05/27/2023) 90 tablet 3   Current Facility-Administered Medications  Medication Dose Route Frequency Provider Last Rate Last Admin   0.9 %  sodium chloride infusion  500 mL Intravenous Once Hilarie Fredrickson, MD        Allergies as of 05/27/2023 - Review Complete 05/27/2023  Allergen Reaction Noted   Detrol [tolterodine] Other (See Comments) 11/15/2016   Gabapentin Palpitations and Other (See Comments) 07/29/2014   Aspirin Other (See Comments) 06/23/2007   Contrast media [iodinated contrast media] Hives, Itching, and Rash 11/15/2016   Imitrex [sumatriptan] Other (See Comments) 07/29/2014   Oxycodone Nausea And Vomiting 02/11/2012   Milk-related compounds Other (See Comments) 01/31/2022   Rosuvastatin Other (See Comments) 01/31/2022   Vicodin hp [hydrocodone-acetaminophen] Nausea And Vomiting 04/23/2021   Avelox [moxifloxacin] Itching 11/27/2009    Family History  Problem Relation Age of Onset   Hyperlipidemia Mother    Hypertension Mother    Sleep apnea Mother    Osteoporosis Mother    Hypertension Father    Heart disease Father    Syncope episode Father    Colon cancer Father    Atrial fibrillation Father    Hypertension Sister    Sleep apnea Sister    Sleep  apnea Brother    Hypertension Brother    Colon cancer Maternal Aunt    Lung cancer Maternal Aunt    Hypertension Maternal Grandmother    CVA Maternal Grandmother    Pancreatic cancer Maternal Grandfather    Colon cancer Maternal Grandfather    Hypertension Maternal Grandfather    Lung cancer Maternal Grandfather    Throat cancer Maternal Grandfather    Colon cancer Paternal Grandfather    Esophageal cancer Neg Hx    Liver cancer Neg Hx    Stomach cancer Neg Hx     Social History   Socioeconomic History   Marital status: Married    Spouse name: Charles   Number of children: 2   Years of education: 11   Highest education level: Not on file  Occupational History   Occupation: disabled  Tobacco Use   Smoking status: Former    Packs/day: 0.50    Years: 25.00    Additional pack years: 0.00    Total pack years: 12.50    Types: Cigarettes    Quit date: 07/24/2008    Years since quitting: 14.8   Smokeless tobacco: Never  Vaping Use   Vaping Use: Never used  Substance and Sexual Activity   Alcohol use: Yes    Comment: 5 drinks per year or less   Drug use: No   Sexual activity: Not Currently  Other Topics Concern   Not on file  Social History Narrative   Caffeine use - coffee daily (decaf), tea once a week      Jehovah's Witness - no blood products   Right handed      Current living situation- lives in [name] with husband   Born in [place] but raised in [place] since she was 62yo. At the age of 59 moved to [place].       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 She has not worked since 2012 when she got disability for conversion disorder.       Married for 13 years in Aug 2024 to current husband (2nd marriage). He is 13 years older than her.        Kids-  adult children 3 sons and 3 daughters. First child  when pantient was 14yo      Legal issues- denies   Social Determinants of Corporate investment banker Strain: Not on file  Food Insecurity: Not on file  Transportation Needs: Not on file  Physical Activity: Not on file  Stress: Not on file  Social Connections: Not on file  Intimate Partner Violence: Not on file    Review of Systems:    Constitutional: No weight loss, fever or chills Cardiovascular: No chest pain Respiratory: No SOB  Gastrointestinal: See HPI and otherwise negative   Physical Exam:  Vital signs: BP 134/70   Pulse 68   Ht 5\' 3"  (1.6 m)   Wt 257 lb (116.6 kg)   BMI 45.53 kg/m   Constitutional:   Pleasant obese AA female appears to be in NAD, Well developed, Well nourished, alert and cooperative Respiratory: Respirations even and unlabored. Lungs clear to auscultation bilaterally.   No wheezes, crackles, or rhonchi.  Cardiovascular: Normal S1, S2. No MRG. Regular rate and rhythm. No peripheral edema, cyanosis or pallor.  Gastrointestinal:  Soft, nondistended, nontender. No rebound or guarding. Normal bowel sounds. No appreciable masses or hepatomegaly. Rectal: External: 3 external hemorrhoids, 1 minimally thrombosed; internal: No TTP or abnormality Psychiatric: Oriented to person, place and time. Demonstrates good judgement and reason without abnormal affect or behaviors.  RELEVANT LABS AND IMAGING: CBC    Component Value Date/Time   WBC 9.2 11/08/2022 1400   RBC 4.41 11/08/2022 1400   HGB 13.3 11/08/2022 1434   HCT 39.0 11/08/2022 1434   PLT 353 11/08/2022 1400   MCV 89.1 11/08/2022 1400   MCH 29.7 11/08/2022 1400   MCHC 33.3 11/08/2022 1400   RDW 12.1 11/08/2022 1400   LYMPHSABS 4.3 (H) 11/08/2022 1400   MONOABS 0.6 11/08/2022 1400   EOSABS 0.3 11/08/2022 1400   BASOSABS 0.1 11/08/2022 1400    CMP     Component Value Date/Time   NA 141 11/08/2022 1434   NA 139 12/10/2021 1058   K 4.1 11/08/2022 1434   CL 107 11/08/2022 1434   CO2 26  11/08/2022 1400   GLUCOSE 92 11/08/2022 1434   BUN 11 11/08/2022 1434   BUN 11 12/10/2021 1058   CREATININE 0.80 11/08/2022 1434   CALCIUM 10.1 11/08/2022 1400   PROT 7.2 11/08/2022 1400   PROT 7.4 08/12/2018 1143   ALBUMIN 4.2 11/08/2022 1400   ALBUMIN 4.9 08/12/2018 1143   AST 31 11/08/2022 1400   ALT 33 11/08/2022 1400   ALKPHOS 68 11/08/2022 1400   BILITOT 0.6 11/08/2022 1400   BILITOT 0.4 08/12/2018 1143   GFRNONAA >60 11/08/2022 1400   GFRAA >60 07/01/2020 0206    Assessment: 1.  External hemorrhoids: Seen at time of exam today, sounds like one of them was severely thrombosed and painful, that looks better now 2.  GERD: Currently on Dexilant 60 daily and Pantoprazole 40 daily, discussed that this is too much medicine for her  Plan: 1.  Prescribed Hydrocortisone ointment to be  placed around the rectum twice daily for 1 to 2 weeks. 2.  Encouraged sitz bath's. 3.  Recommend she discontinue Pantoprazole.  She will call me after a week to let me know how her reflux is doing.  If she has issues with breakthrough just on the Dexilant then would recommend adding Pepcid 40 mg nightly. 4.  Patient to follow in clinic with me as needed.  Hyacinth Meeker, PA-C [facility] 05/27/2023, 3:16 PM  Cc: Tally Joe, MD

## 2023-05-27 NOTE — Patient Instructions (Addendum)
_______________________________________________________  If your blood pressure at your visit was 140/90 or greater, please contact your primary care physician to follow up on this.  _______________________________________________________  If you are age 63 or older, your body mass index should be between 23-30. Your Body mass index is 45.53 kg/m. If this is out of the aforementioned range listed, please consider follow up with your Primary Care Provider.  If you are age 58 or younger, your body mass index should be between 19-25. Your Body mass index is 45.53 kg/m. If this is out of the aformentioned range listed, please consider follow up with your Primary Care Provider.   We have sent the following medications to your pharmacy for you to pick up at your convenience: Hydrocortisone ointment twice daily for 14 days.  Stop Pantoprazole.  The [facility] providers would like to encourage you to use [facility] to communicate with providers for non-urgent requests or questions.  Due to long hold times on the telephone, sending your provider a message by [facility] may be a faster and more efficient way to get a response.  Please allow 48 business hours for a response.  Please remember that this is for non-urgent requests.   It was a pleasure to see you today!  Thank you for trusting me with your gastrointestinal care!    Hyacinth Meeker, PA-C

## 2023-06-18 DIAGNOSIS — G4733 Obstructive sleep apnea (adult) (pediatric): Secondary | ICD-10-CM | POA: Diagnosis not present

## 2023-06-23 ENCOUNTER — Ambulatory Visit (INDEPENDENT_AMBULATORY_CARE_PROVIDER_SITE_OTHER): Payer: Medicare PPO

## 2023-06-23 ENCOUNTER — Encounter: Payer: Self-pay | Admitting: Internal Medicine

## 2023-06-23 ENCOUNTER — Ambulatory Visit: Payer: Medicare PPO | Admitting: Internal Medicine

## 2023-06-23 VITALS — BP 128/86 | HR 67 | Ht 63.0 in | Wt 260.8 lb

## 2023-06-23 DIAGNOSIS — E7849 Other hyperlipidemia: Secondary | ICD-10-CM | POA: Diagnosis not present

## 2023-06-23 DIAGNOSIS — R002 Palpitations: Secondary | ICD-10-CM | POA: Diagnosis not present

## 2023-06-23 DIAGNOSIS — I2510 Atherosclerotic heart disease of native coronary artery without angina pectoris: Secondary | ICD-10-CM

## 2023-06-23 NOTE — Progress Notes (Signed)
LIPID CLINIC CONSULT NOTE  Chief Complaint:  Follow-up dyslipidemia  Primary Care Physician: Tally Joe, MD  Primary Cardiologist:  Thomasene Ripple, DO  HPI:  Paula [name] is a 62 y.o. female who is being seen today for the evaluation of dyslipidemia at the request of Tally Joe, MD.  This is a pleasant 62 year old female kindly referred for evaluation management of dyslipidemia by Dr. Servando Salina.  She has a history of high cholesterol in the past and unfortunately has been intolerant to statins causing significant myalgias.  She also has a history of obesity, chronic pain and fibromyalgia, family history of heart disease and other medication intolerances.  Recently she underwent CT coronary angiography which demonstrated mild nonobstructive coronary disease with a calcium score of 69, 88th percentile for age and sex matched controls.  She was informed of those results today.  She did have recent lipids however showing a significantly elevated cholesterol with total of 384, triglycerides 136, HDL 53 and LDL 304.  She reports diet that seems to have a significant amount of saturated fats but does not eat fried food regularly however does use a lot of sources of saturated fat and would benefit from dietary modification.  That being said given her severely elevated cholesterol, its likely she has familial hyperlipidemia.  She also reported some palpitations today and an EKG was performed.  This was personally reviewed and indicates sinus rhythm, nonspecific T wave changes at 62.  04/23/2022  Paula [name] returns today for follow-up.  She reports compliance with the Repatha although unfortunately did not get her lipids rechecked.  Her LDL was quite high at 304 and we did send her for genetic testing.  This did show 3 variants of unknown significance including mutations in APO A5, LPL and LP(a).  It is expected that her LP(a) is quite high however the Repatha should reduce that by about 20 to 30%.   Unfortunately she could not tolerate statins.  Ultimately she may need additional therapy.  She has also been describing leg pain when she walks.  Is not always associated with walking a certain distance in fact she can have some pain with sitting in certain positions.  She notes also she gets discomfort that in her legs and buttocks when she has bowel movements.  This is concerning for possible lumbosacral disease or perhaps related to spinal cord impingement.  She denies any specific numbness or anesthesia.  She does see orthopedics and I encouraged her to follow-up with them.  My suspicion for arterial insufficiency is low.  Have coronary CT angiography in January which showed a calcium score of 69, 88th percentile for sex and matched control, again suggesting early onset heart disease however only minimal nonobstructive coronary disease was noted.  08/27/2022  Paula [name] seen today in follow-up.  She has done well on Repatha with marked reduction in her lipids.  When I saw her last however her cholesterol was still little higher than ideal.  As mentioned above she has a genetic dyslipidemia.  I advised adding ezetimibe but this never was started.  Repeat lipids are essentially stable.  LDL particle #1379, LDL-C of 110, HDL-C of 44 and triglycerides 221.  Of concern today, she is mention she has been having some pain in the left side of her neck as well as the left clavicular area.  This has been worsening over the past several weeks to months.  Worse when she lays on her left side.  She is also  noted some swelling there.  She gets some hoarseness with her voice and some drainage and difficulty swallowing.  She also has some left ear pain.  She notes no hearing loss.  She denies any fever, she has no constitutional symptoms, no recent infection.  She has had some dental work and has some bridges but this feels different than typical tooth pain.  She has a history of prior ACDF of C4-C7.  This could represent  a cervical radiculopathy.    12/26/2022  Paula [name] is seen today in follow-up.  She has been dealing with a number of issues with neck and back pain.  This is limited her activity.  She did have repeat cholesterol testing from her primary care provider.  This was a regular lipid profile and demonstrated total cholesterol 200, HDL 51, triglycerides 169 and LDL 119.  This is in the range of previous testing for her.  Her cholesterol still is elevated above target.  We have previously discussed the possibility of additional therapy.  She says she is intent on more weight loss and activity.  She has signed up for a new gym membership.  06/23/2023  Paula [name] returns today for follow-up.  She has not been taking ezetimibe.  She has been under a lot of stress recently has been caring for her husband who had knee surgery.  Her last lipids in January showed total cholesterol 200 with LDL of 119.  She said she had recent labs drawn last week however they have not yet resulted.  This may be related to IT difficulties last week.  She is on Repatha but not taking ezetimibe.  She also reports has been having some worsening palpitations.  She apparently wore monitor for this in 2019 which was "normal".  PMHx:  Past Medical History:  Diagnosis Date   Anxiety    pt stated anxiety episodes resemble stroke symptoms   Chronic lower back pain    Conversion disorder    Depression    Diverticulitis    Expressive aphasia 11/15/2016   Fibromyalgia    GERD (gastroesophageal reflux disease)    Heart murmur    High cholesterol    History of gastric ulcer    History of hiatal hernia    History of kidney stones    History of thyroid nodule    Hyperlipidemia    Hypertension    IBS (irritable bowel syndrome)    Intermittent vertigo    Migraine    "a few times/year now; maybe" (11/15/2016)   Obesity    OSA on CPAP    wears CPAP nightly ;study in epic 02-01-2014 (11/15/2016)   Osteoarthritis    "knees, hands,  back, hips" (11/15/2016)   Pneumonia X 1   hx of   Psychogenic tremor    intermittant head tremor-(11/15/2016)   Refusal of blood transfusions as patient is Jehovah's Witness    RLS (restless legs syndrome)    Sleep apnea    CPAP   Urinary incontinence    Vertigo     Past Surgical History:  Procedure Laterality Date   ABDOMINAL HYSTERECTOMY  1990   Partial, still has ovaries   ANTERIOR CERVICAL DECOMP/DISCECTOMY FUSION  02/10/2004   C4 -- C7   BACK SURGERY     CARDIOVASCULAR STRESS TEST  2017   Negative   CESAREAN SECTION  1977; 1982   COLONOSCOPY     CYSTOSCOPY W/ RETROGRADES Right 06/07/2015   Procedure: CYSTOSCOPY WITH RETROGRADE PYELOGRAM;  Surgeon: Earle Gell  Marlou Porch, MD;  Location: [facility];  Service: Urology;  Laterality: Right;   CYSTOSCOPY WITH URETEROSCOPY AND STENT PLACEMENT Right 06/07/2015   Procedure: RIGHT URETEROSCOPY, LASER LITHOTRIPSY, STONE REMOVAL AND RIGHT URETERAL STENT PLACEMENT;  Surgeon: Crist Fat, MD;  Location: [facility];  Service: Urology;  Laterality: Right;   DIRECT LARYNGOSCOPY N/A 10/29/2021   Procedure: DIRECT LARYNGOSCOPY WITH BIOPSY;  Surgeon: Christia Reading, MD;  Location: [facility] OR;  Service: ENT;  Laterality: N/A;   HERNIA REPAIR     HOLMIUM LASER APPLICATION N/A 06/07/2015   Procedure: HOLMIUM LASER APPLICATION;  Surgeon: Crist Fat, MD;  Location: [facility];  Service: Urology;  Laterality: N/A;   LAPAROSCOPIC CHOLECYSTECTOMY  10/07/2000   LIPOMA EXCISION  01/2017   from neck   UMBILICAL HERNIA REPAIR  age 50   UPPER GI ENDOSCOPY      FAMHx:  Family History  Problem Relation Age of Onset   Hyperlipidemia Mother    Hypertension Mother    Sleep apnea Mother    Osteoporosis Mother    Hypertension Father    Heart disease Father    Syncope episode Father    Colon cancer Father    Atrial fibrillation Father    Hypertension Sister    Sleep apnea Sister    Sleep apnea  Brother    Hypertension Brother    Colon cancer Maternal Aunt    Lung cancer Maternal Aunt    Hypertension Maternal Grandmother    CVA Maternal Grandmother    Pancreatic cancer Maternal Grandfather    Colon cancer Maternal Grandfather    Hypertension Maternal Grandfather    Lung cancer Maternal Grandfather    Throat cancer Maternal Grandfather    Colon cancer Paternal Grandfather    Esophageal cancer Neg Hx    Liver cancer Neg Hx    Stomach cancer Neg Hx     SOCHx:   reports that she quit smoking about 14 years ago. Her smoking use included cigarettes. She started smoking about 39 years ago. She has a 12.5 pack-year smoking history. She has never used smokeless tobacco. She reports current alcohol use. She reports that she does not use drugs.  ALLERGIES:  Allergies  Allergen Reactions   Detrol [Tolterodine] Other (See Comments)    slurred speech, tremors, dizziness   Gabapentin Palpitations and Other (See Comments)    Wt gain, tremor   Aspirin Other (See Comments)    Avoids--- history of gastric ulcers    Contrast Media [Iodinated Contrast Media] Hives, Itching and Rash    CT contrast-Needed to take benadryl    Imitrex [Sumatriptan] Other (See Comments)    Body hurts   Oxycodone Nausea And Vomiting   Milk-Related Compounds Other (See Comments)   Rosuvastatin Other (See Comments)   Vicodin Hp [Hydrocodone-Acetaminophen] Nausea And Vomiting   Avelox [Moxifloxacin] Itching    ROS: Pertinent items noted in HPI and remainder of comprehensive ROS otherwise negative.  HOME MEDS: Current Outpatient Medications on File Prior to Visit  Medication Sig Dispense Refill   acetaminophen (TYLENOL) 500 MG tablet Take 1,000 mg by mouth every 6 (six) hours as needed for headache (pain).     albuterol (VENTOLIN HFA) 108 (90 Base) MCG/ACT inhaler 2 puffs every 6 (six) hours as needed for shortness of breath or wheezing.     amLODipine (NORVASC) 5 MG tablet Take 1 tablet (5 mg total) by  mouth daily. 90 tablet 3   benzonatate (TESSALON) 100 MG  capsule Take 1 capsule (100 mg total) by mouth every 8 (eight) hours. 21 capsule 0   Cholecalciferol (VITAMIN D-1000 MAX ST) 25 MCG (1000 UT) tablet Take by mouth.     Cyanocobalamin (VITAMIN B 12) 500 MCG TABS 1 tablet     cyclobenzaprine (FLEXERIL) 10 MG tablet Take 1 tablet by mouth at bedtime as needed for muscle spasms.     dexlansoprazole (DEXILANT) 60 MG capsule Take 60 mg by mouth at bedtime.     estradiol (ESTRACE) 0.1 MG/GM vaginal cream Place 0.5 g vaginally 2 (two) times a week. Place 0.5g nightly for two weeks then twice a week after 42.5 g 11   Evolocumab (REPATHA SURECLICK) 140 MG/ML SOAJ INJECT (140MG ) INTO THE SKIN EVERY 14 DAYS 2 mL 11   fluticasone (FLONASE) 50 MCG/ACT nasal spray Place 2 sprays into both nostrils as needed.     hydrocortisone 1 % ointment Apply 1 Application topically 2 (two) times daily. 30 g 0   mirabegron ER (MYRBETRIQ) 25 MG TB24 tablet Take 1 tablet (25 mg total) by mouth daily. 30 tablet 5   phenazopyridine (PYRIDIUM) 95 MG tablet Take 1 tablet (95 mg total) by mouth 3 (three) times daily as needed for pain. 15 tablet 1   Semaglutide,0.25 or 0.5MG /DOS, (OZEMPIC, 0.25 OR 0.5 MG/DOSE,) 2 MG/1.5ML SOPN Inject 0.5 mg into the skin once a week.     SYMBICORT 160-4.5 MCG/ACT inhaler as needed.     Vitamin E 670 MG (1000 UT) CAPS Take by mouth.     dicyclomine (BENTYL) 20 MG tablet Take 1 tablet (20 mg total) by mouth 2 (two) times daily. 60 tablet 11   ezetimibe (ZETIA) 10 MG tablet Take 1 tablet (10 mg total) by mouth daily. 90 tablet 3   pantoprazole (PROTONIX) 40 MG tablet Take by mouth.     Current Facility-Administered Medications on File Prior to Visit  Medication Dose Route Frequency Provider Last Rate Last Admin   0.9 %  sodium chloride infusion  500 mL Intravenous Once Hilarie Fredrickson, MD        LABS/IMAGING: No results found for this or any previous visit (from the past 48 hour(s)). No  results found.  LIPID PANEL:    Component Value Date/Time   CHOL 281 (H) 07/01/2020 0206   TRIG 175 (H) 07/01/2020 0206   HDL 44 07/01/2020 0206   CHOLHDL 6.4 07/01/2020 0206   VLDL 35 07/01/2020 0206   LDLCALC 202 (H) 07/01/2020 0206   LDLDIRECT 170.1 01/08/2010 0943    WEIGHTS: Wt Readings from Last 3 Encounters:  06/23/23 260 lb 12.8 oz (118.3 kg)  05/27/23 257 lb (116.6 kg)  01/15/23 256 lb (116.1 kg)    VITALS: BP 128/86 (BP Location: Left Arm, Patient Position: Sitting, Cuff Size: Large)   Pulse 67   Ht 5\' 3"  (1.6 m)   Wt 260 lb 12.8 oz (118.3 kg)   SpO2 94%   BMI 46.20 kg/m   EXAM: Deferred  EKG: Deferred  ASSESSMENT: Palpitations Genetically confirmed familial hyperlipidemia, LDL greater than 300, notable mutations in APO A5, LPL and LP(a) Premature coronary disease with a calcium score 69, 88th percentile for age and sex matched controls, minimal nonobstructive disease by CTA (12/2021) Family history of coronary disease Atherogenic diet Morbid obesity Statin intolerance-myalgias Leg pain with walking/lumbosacral pain, worse with defecation Left neck pain  PLAN: 1.   Ms. Hegel had repeat lipids drawn last week however they have not yet resulted.  We  tried to get them but it may be an issue with IT.  She may have to have them redrawn.  She is not taking ezetimibe.  Once we get them I would probably encourage her to get the ezetimibe prescription filled and take it in addition to her Repatha.  She also reported she is having some palpitations.  Although she is followed by Dr. Servando Salina, she had asked about monitoring.  Will go ahead and place a 2-week monitor and she can follow-up with her regarding that.  I am happy to see her back for lipid follow-up in 6 months or sooner as necessary.  Chrystie Nose, MD, [facility], FACP  [place]  [facility]  Medical Director of the [facility]  Card[facility]ovascular Risk Reduction Clinic Diplomate of the  American Board of Clinical Lipidology Attending Cardiologist  Direct Dial: [phone]  Fax: [phone]  Website:  [email or web address] [email or web address] 06/23/2023, 11:06 AM

## 2023-06-23 NOTE — Progress Notes (Unsigned)
Enrolled for [employer] to mail a ZIO XT long term holter monitor to the patients address on file.  

## 2023-06-23 NOTE — Patient Instructions (Addendum)
Medication Instructions:  Your physician recommends that you continue on your current medications as directed. Please refer to the Current Medication list given to you today.  *If you need a refill on your cardiac medications before your next appointment, please call your pharmacy*   Testing/Procedures:  ZIO XT- Long Term Monitor Instructions  Your physician has requested you wear a ZIO patch monitor for 14 days.  This is a single patch monitor. [employer] supplies one patch monitor per enrollment. Additional stickers are not available. Please do not apply patch if you will be having a Nuclear Stress Test,  Echocardiogram, Cardiac CT, MRI, or Chest Xray during the period you would be wearing the  monitor. The patch cannot be worn during these tests. You cannot remove and re-apply the  ZIO XT patch monitor.  Your ZIO patch monitor will be mailed 3 day USPS to your address on file. It may take 3-5 days  to receive your monitor after you have been enrolled.  Once you have received your monitor, please review the enclosed instructions. Your monitor  has already been registered assigning a specific monitor serial # to you.  Billing and Patient Assistance Program Information  We have supplied [employer] with any of your insurance information on file for billing purposes. [employer] offers a sliding scale Patient Assistance Program for patients that do not have  insurance, or whose insurance does not completely cover the cost of the ZIO monitor.  You must apply for the Patient Assistance Program to qualify for this discounted rate.  To apply, please call [employer] at [phone], select option 4, select option 2, ask to apply for  Patient Assistance Program. Meredeth Ide will ask your household income, and how many people  are in your household. They will quote your out-of-pocket cost based on that information.  [employer] will also be able to set up a 66-month, interest-free payment plan if needed.  Applying  the monitor   Shave hair from upper left chest.  Hold abrader disc by orange tab. Rub abrader in 40 strokes over the upper left chest as  indicated in your monitor instructions.  Clean area with 4 enclosed alcohol pads. Let dry.  Apply patch as indicated in monitor instructions. Patch will be placed under collarbone on left  side of chest with arrow pointing upward.  Rub patch adhesive wings for 2 minutes. Remove white label marked "1". Remove the white  label marked "2". Rub patch adhesive wings for 2 additional minutes.  While looking in a mirror, press and release button in center of patch. A small green light will  flash 3-4 times. This will be your only indicator that the monitor has been turned on.  Do not shower for the first 24 hours. You may shower after the first 24 hours.  Press the button if you feel a symptom. You will hear a small click. Record Date, Time and  Symptom in the Patient Logbook.  When you are ready to remove the patch, follow instructions on the last 2 pages of Patient  Logbook. Stick patch monitor onto the last page of Patient Logbook.  Place Patient Logbook in the blue and white box. Use locking tab on box and tape box closed  securely. The blue and white box has prepaid postage on it. Please place it in the mailbox as  soon as possible. Your physician should have your test results approximately 7 days after the  monitor has been mailed back to [facility].  Call [employer]  [facility] at [phone] if you have questions regarding  your ZIO XT patch monitor. Call them immediately if you see an orange light blinking on your  monitor.  If your monitor falls off in less than 4 days, contact our Monitor department at [phone].  If your monitor becomes loose or falls off after 4 days call [employer] at [phone] for  suggestions on securing your monitor    Follow-Up: At [facility], you and your health needs are our priority.  As part  of our continuing mission to provide you with exceptional heart care, we have created designated Provider Care Teams.  These Care Teams include your primary Cardiologist (physician) and Advanced Practice Providers (APPs -  Physician Assistants and Nurse Practitioners) who all work together to provide you with the care you need, when you need it.  We recommend signing up for the patient portal called "MyChart".  Sign up information is provided on this After Visit Summary.  MyChart is used to connect with patients for Virtual Visits (Telemedicine).  Patients are able to view lab/test results, encounter notes, upcoming appointments, etc.  Non-urgent messages can be sent to your provider as well.   To learn more about what you can do with MyChart, go to [email or web address].    Your next appointment:   6 month(s)  Provider:   Dr. Rennis Golden  Other Instructions Lipid Clinic

## 2023-06-25 DIAGNOSIS — R002 Palpitations: Secondary | ICD-10-CM

## 2023-07-02 DIAGNOSIS — E7849 Other hyperlipidemia: Secondary | ICD-10-CM

## 2023-07-04 ENCOUNTER — Encounter (HOSPITAL_COMMUNITY): Payer: Self-pay | Admitting: Clinical

## 2023-07-04 ENCOUNTER — Ambulatory Visit (INDEPENDENT_AMBULATORY_CARE_PROVIDER_SITE_OTHER): Payer: Medicare PPO | Admitting: Clinical

## 2023-07-04 DIAGNOSIS — F411 Generalized anxiety disorder: Secondary | ICD-10-CM

## 2023-07-04 DIAGNOSIS — F321 Major depressive disorder, single episode, moderate: Secondary | ICD-10-CM | POA: Diagnosis not present

## 2023-07-04 NOTE — Progress Notes (Unsigned)
THERAPIST PROGRESS NOTE  Session Time: 9:00-10:00am  Session #5  Participation Level: Active  Behavioral Response: Neat and Well Groomed Alert Negative, Euthymic, and Hopeless  Type of Therapy: Individual Therapy  Treatment Goals addressed:  LTG: Jiselle will score less than 5 on the Generalized Anxiety Disorder 7 Scale (GAD-7)    STG: Keala will reduce frequency of avoidant behaviors by 50% as evidenced by self-report in therapy sessions  LTG: Reduce frequency, intensity, and duration of depression symptoms so that daily functioning is improved   STG: Reduce overall depression score to no higher than 9 on the Patient Health Questionnaire (PHQ-9)    ProgressTowards Goals: Progressing  Interventions: Supportive and Reframing  Summary: ADELEIGH BARLETTA is a 62 y.o. female who presents for therapy because her doctors feel she has a Conversion Disorder and has not worked out some of her issues which are now coming out physically.  She reported that she has been working on herself by being more involved in ministry and praying more.  She focused the entirety of the session on how irritating her mother's situation and how frustrating her relationship with her 75yo husband is.  CSW spent the entirety of the session attempting to reframe her thoughts in a more positive or detached manner.  She stated she knows she cannot control her mother's situation.  She agreed it might be beneficial for her to pray for the ability to let go of her negative feelings.  The concept of detachment was reviewed and discussed, especially through the lens of Al Anon literature.  She feels that 10% of her problem is anger about the fact that her mother chose to be at sister's house which is smaller, dirtier, less organized, and filled with people wanting to drink and party most of the time.  The other 90% of her problem is her husband who says no to every request she makes, refuses to take care of his hygiene, is  demanding rather than questioning, rolls his eyes at her frequently, and verbally aggressive.  She reiterated almost all the arguments and discontent items that she has reviewed previously, hardly seems to have progressed at all.  She feels that she would have peace of mind if she could financially afford to leave him.  She does not feel that complimenting him when he does things well teaches him to do that item more.  They [facility] couples [facility] one point but it did not help because she felt it was mostly to tell wives how to handle their husbands' PTSD.  He goes to PTSD support groups but she feels he is extra grumpy when he comes home.  CSW explained that it may be bringing up hard things for him to deal with.  CSW asked if she would be willing to bring husband for some session to work on communication and she did not commit about whether she would consider it.  Suicidal/Homicidal: No  Therapist Response: Patient is progressing AEB engaging in scheduled therapy session.  She presented oriented x5 and stated she was feeling "good/okay."  CSW evaluated patient's medication compliance and self-care since last session.  CSW reviewed the last session with patient who reported that she feels she is working on herself, but the other people in her life are not doing the same thing, so she feels she is doing her part.  Patient was able to acknowledge during the session that letting go and detaching from the situation with mother is  the only way she can achieve peace.  She has already spoken to her daughter about not telling her things that are going on because she does not need to know everything, only what pertains to her such as mother's need to go shopping.  Patient finds fault with the way many things are done by mother, sister, brother-in-law, and husband, perhaps justifiably so.  .CSW spent time discussing the need for detachment, specifically how to accomplish this and  ways in which it can lead her to a more peaceful thought life,  CSW gave her 2 handouts from Al Anon that address the idea of detachment.  She was embarrassed and did not want the word said loudly so people would not think she is an alcoholic.  The purpose of Al Helane Rima was once again explained.  Patient received these handouts willingly and could discuss them in a way to indicate fair comprehension.  CSW assigned patient the task of reading these items, working on detaching, and thinking about doing a few sessions to work on communication with husband.  CSW encouraged patient to schedule more therapy sessions for the future, as this was the last one scheduled.  Throughout the session, CSW gave patient the opportunity to explore thoughts and feelings associated with current life situations and past/present external stressors.   CSW encouraged patient's expression of feelings and validated patient's thoughts using empathy, active listening, open body language, and unconditional positive regard.       Recommendations:  Return to therapy in 1 week, engage in self care behaviors, plan positive social engagements, examine thoughts for cognitive distortion as taught in session, return to next session prepared to talk about what she observes   Plan: Return again as scheduling permits  Diagnosis:  No diagnosis found.   Collaboration of Care: Psychiatrist AEB - previously referred for a medication evaluation and has an appointment 4/20, psychiatric provider can read therapy notes  Patient/Guardian was advised Release of Information must be obtained prior to any record release in order to collaborate their care with an outside provider. Patient/Guardian was advised if they have not already done so to contact the registration department to sign all necessary forms in order for [place] to release information regarding their care.   Consent: Patient/Guardian gives verbal consent for treatment and assignment of benefits  for services provided during this visit. Patient/Guardian expressed understanding and agreed to proceed.   Lynnell Chad, LCSW 07/04/2023

## 2023-07-14 NOTE — Telephone Encounter (Signed)
I looked at the monitor.  There is some bursts of atrial tachycardia but there is no atrial fibrillation and no other serious arrhythmia.  I would offer switching from amlodipine to diltiazem 180 mg once daily.  This should have a similar effect on her blood pressure, with added benefit for suppressing the SVT. Leaving the monitor there for Dr. Rennis Golden to finalize.

## 2023-07-15 ENCOUNTER — Ambulatory Visit: Payer: Medicare PPO | Admitting: Obstetrics and Gynecology

## 2023-07-15 ENCOUNTER — Encounter: Payer: Self-pay | Admitting: Obstetrics and Gynecology

## 2023-07-15 MED ORDER — DILTIAZEM HCL ER COATED BEADS 180 MG PO CP24: 180.0000 mg | ORAL_CAPSULE | Freq: Every day | ORAL | 3 refills | Status: DC

## 2023-07-16 ENCOUNTER — Ambulatory Visit (INDEPENDENT_AMBULATORY_CARE_PROVIDER_SITE_OTHER): Payer: Medicare PPO | Admitting: Obstetrics and Gynecology

## 2023-07-16 ENCOUNTER — Encounter: Payer: Self-pay | Admitting: Obstetrics and Gynecology

## 2023-07-16 VITALS — BP 140/73 | HR 66

## 2023-07-16 DIAGNOSIS — N3281 Overactive bladder: Secondary | ICD-10-CM

## 2023-07-16 DIAGNOSIS — N393 Stress incontinence (female) (male): Secondary | ICD-10-CM | POA: Diagnosis not present

## 2023-07-16 NOTE — Progress Notes (Signed)
[facility]   Subjective:     Chief Complaint:  Chief Complaint  Patient presents with   Pessary Check   History of Present Illness: Paula [name] is a 62 y.o. female with stress incontinence who presents for a pessary check. She is using a size #2 incontinence ring pessary. The pessary has been working well but still having trouble holding her bladder. She took the pessary out prior to her visit. Denies vaginal bleeding.   Only drinking mushroom coffee and water. Diagnosed with SVT so stopped the myrbetriq because she was concerned about potential side effects. Not interested in surgery for SUI at this time as her husband just had knee surgery and is recovering from that.   Past Medical History: Patient  has a past medical history of Anxiety, Chronic lower back pain, Conversion disorder, Depression, Diverticulitis, Expressive aphasia (11/15/2016), Fibromyalgia, GERD (gastroesophageal reflux disease), Heart murmur, High cholesterol, History of gastric ulcer, History of hiatal hernia, History of kidney stones, History of thyroid nodule, Hyperlipidemia, Hypertension, IBS (irritable bowel syndrome), Intermittent vertigo, Migraine, Obesity, OSA on CPAP, Osteoarthritis, Pneumonia (X 1), Psychogenic tremor, Refusal of blood transfusions as patient is Jehovah's Witness, RLS (restless legs syndrome), Sleep apnea, Urinary incontinence, and Vertigo.   Past Surgical History: She  has a past surgical history that includes Cesarean section (1977; 1982); Anterior cervical decomp/discectomy fusion (02/10/2004); Umbilical hernia repair (age 40); Cardiovascular stress test (2017); Cystoscopy with ureteroscopy and stent placement (Right, 06/07/2015); Holmium laser application (N/A, 06/07/2015); Cystoscopy w/ retrogrades (Right, 06/07/2015); Upper gi endoscopy; Colonoscopy; Back surgery; Laparoscopic cholecystectomy (10/07/2000); Hernia repair; Abdominal hysterectomy (1990); Lipoma excision  (01/2017); and Direct laryngoscopy (N/A, 10/29/2021).   Medications: She has a current medication list which includes the following prescription(s): acetaminophen, albuterol, benzonatate, vitamin d-1000 max st, vitamin b 12, cyclobenzaprine, dexlansoprazole, dicyclomine, diltiazem, estradiol, repatha sureclick, ezetimibe, fluticasone, hydrocortisone, mirabegron er, pantoprazole, phenazopyridine, ozempic (0.25 or 0.5 mg/dose), symbicort, and vitamin e, and the following Facility-Administered Medications: sodium chloride.   Allergies: Patient is allergic to detrol [tolterodine], gabapentin, aspirin, contrast media [iodinated contrast media], imitrex [sumatriptan], oxycodone, milk-related compounds, rosuvastatin, vicodin hp [hydrocodone-acetaminophen], and avelox [moxifloxacin].   Social History: Patient  reports that she quit smoking about 14 years ago. Her smoking use included cigarettes. She started smoking about 40 years ago. She has a 12.5 pack-year smoking history. She has never used smokeless tobacco. She reports current alcohol use. She reports that she does not use drugs.      Objective:    Physical Exam: BP (!) 140/73   Pulse 66  Gen: No apparent distress, A&O x 3.  Detailed Urogynecologic Evaluation:  Pelvic Exam: Normal external female genitalia; Bartholin's and Skene's glands normal in appearance; urethral meatus normal in appearance, no urethral masses or discharge. Normal vaginal mucosa. The pessary was replaced. It was comfortable to the patient and fit well.    Assessment/Plan:    Assessment: Paula [name] is a 62 y.o. with stress incontinence and OAB here for a pessary check.   Plan: - Pessary replaced today. Not interested in sling surgery at this time.  - Will defer any further medication for OAB until she has a plan for SVT in place. We discussed that myrbetriq can sometimes interact with certain cardiac meds (beta blockers) so may not be the best medication for her long  term.  - Not interested in pelvic PT at this time.   Return 4 months or sooner if needed  Marguerita Beards, MD  Time spent: I  spent 25 minutes dedicated to the care of this patient on the date of this encounter to include pre-visit review of records, face-to-face time with the patient and post visit documentation.

## 2023-07-18 ENCOUNTER — Ambulatory Visit (HOSPITAL_COMMUNITY): Admitting: Clinical

## 2023-07-19 DIAGNOSIS — G4733 Obstructive sleep apnea (adult) (pediatric): Secondary | ICD-10-CM | POA: Diagnosis not present

## 2023-07-21 ENCOUNTER — Ambulatory Visit: Payer: Medicare PPO | Admitting: Obstetrics and Gynecology

## 2023-07-21 DIAGNOSIS — E7849 Other hyperlipidemia: Secondary | ICD-10-CM | POA: Diagnosis not present

## 2023-08-01 ENCOUNTER — Ambulatory Visit (HOSPITAL_COMMUNITY): Admitting: Clinical

## 2023-08-05 DIAGNOSIS — R7303 Prediabetes: Secondary | ICD-10-CM | POA: Diagnosis not present

## 2023-08-05 DIAGNOSIS — K219 Gastro-esophageal reflux disease without esophagitis: Secondary | ICD-10-CM | POA: Diagnosis not present

## 2023-08-05 DIAGNOSIS — E782 Mixed hyperlipidemia: Secondary | ICD-10-CM | POA: Diagnosis not present

## 2023-08-05 DIAGNOSIS — J4520 Mild intermittent asthma, uncomplicated: Secondary | ICD-10-CM | POA: Diagnosis not present

## 2023-08-05 DIAGNOSIS — Z209 Contact with and (suspected) exposure to unspecified communicable disease: Secondary | ICD-10-CM | POA: Diagnosis not present

## 2023-08-05 DIAGNOSIS — Z0000 Encounter for general adult medical examination without abnormal findings: Secondary | ICD-10-CM | POA: Diagnosis not present

## 2023-08-05 DIAGNOSIS — I10 Essential (primary) hypertension: Secondary | ICD-10-CM | POA: Diagnosis not present

## 2023-08-05 DIAGNOSIS — I471 Supraventricular tachycardia, unspecified: Secondary | ICD-10-CM | POA: Diagnosis not present

## 2023-08-05 DIAGNOSIS — F411 Generalized anxiety disorder: Secondary | ICD-10-CM | POA: Diagnosis not present

## 2023-08-05 DIAGNOSIS — Z1331 Encounter for screening for depression: Secondary | ICD-10-CM | POA: Diagnosis not present

## 2023-08-06 ENCOUNTER — Encounter: Payer: Self-pay | Admitting: Family Medicine

## 2023-08-06 ENCOUNTER — Encounter: Payer: Self-pay | Admitting: Internal Medicine

## 2023-08-07 DIAGNOSIS — G4733 Obstructive sleep apnea (adult) (pediatric): Secondary | ICD-10-CM | POA: Diagnosis not present

## 2023-08-12 DIAGNOSIS — Z6841 Body Mass Index (BMI) 40.0 and over, adult: Secondary | ICD-10-CM | POA: Diagnosis not present

## 2023-08-12 DIAGNOSIS — F419 Anxiety disorder, unspecified: Secondary | ICD-10-CM | POA: Diagnosis not present

## 2023-08-12 DIAGNOSIS — R7303 Prediabetes: Secondary | ICD-10-CM | POA: Diagnosis not present

## 2023-08-12 DIAGNOSIS — I10 Essential (primary) hypertension: Secondary | ICD-10-CM | POA: Diagnosis not present

## 2023-08-22 DIAGNOSIS — G4733 Obstructive sleep apnea (adult) (pediatric): Secondary | ICD-10-CM | POA: Diagnosis not present

## 2023-09-26 DIAGNOSIS — M546 Pain in thoracic spine: Secondary | ICD-10-CM | POA: Diagnosis not present

## 2023-10-16 ENCOUNTER — Ambulatory Visit: Payer: Medicare PPO | Admitting: Obstetrics and Gynecology

## 2023-10-21 DIAGNOSIS — M25561 Pain in right knee: Secondary | ICD-10-CM | POA: Diagnosis not present

## 2023-10-21 DIAGNOSIS — M25562 Pain in left knee: Secondary | ICD-10-CM | POA: Diagnosis not present

## 2023-10-29 ENCOUNTER — Encounter: Payer: Self-pay | Admitting: Physician Assistant

## 2023-10-29 ENCOUNTER — Ambulatory Visit (HOSPITAL_BASED_OUTPATIENT_CLINIC_OR_DEPARTMENT_OTHER)
Admission: RE | Admit: 2023-10-29 | Discharge: 2023-10-29 | Disposition: A | Discharge status: Home / Self Care | Payer: Medicare PPO | Source: Ambulatory Visit | Attending: Physician Assistant | Admitting: Physician Assistant

## 2023-10-29 ENCOUNTER — Ambulatory Visit: Payer: Medicare PPO | Admitting: Physician Assistant

## 2023-10-29 VITALS — BP 124/86 | HR 70 | Ht 63.0 in | Wt 259.0 lb

## 2023-10-29 DIAGNOSIS — R197 Diarrhea, unspecified: Secondary | ICD-10-CM

## 2023-10-29 DIAGNOSIS — R1013 Epigastric pain: Secondary | ICD-10-CM | POA: Diagnosis not present

## 2023-10-29 DIAGNOSIS — K760 Fatty (change of) liver, not elsewhere classified: Secondary | ICD-10-CM | POA: Diagnosis not present

## 2023-10-29 DIAGNOSIS — R110 Nausea: Secondary | ICD-10-CM | POA: Insufficient documentation

## 2023-10-29 DIAGNOSIS — A09 Infectious gastroenteritis and colitis, unspecified: Secondary | ICD-10-CM

## 2023-10-29 DIAGNOSIS — R1010 Upper abdominal pain, unspecified: Secondary | ICD-10-CM | POA: Diagnosis not present

## 2023-10-29 DIAGNOSIS — K858 Other acute pancreatitis without necrosis or infection: Secondary | ICD-10-CM | POA: Diagnosis not present

## 2023-10-29 DIAGNOSIS — K219 Gastro-esophageal reflux disease without esophagitis: Secondary | ICD-10-CM | POA: Diagnosis not present

## 2023-10-29 DIAGNOSIS — K429 Umbilical hernia without obstruction or gangrene: Secondary | ICD-10-CM | POA: Diagnosis not present

## 2023-10-29 MED ORDER — DICYCLOMINE HCL 20 MG PO TABS: 20.0000 mg | ORAL_TABLET | Freq: Two times a day (BID) | ORAL | 11 refills | Status: AC

## 2023-10-29 MED ORDER — ONDANSETRON 8 MG PO TBDP: 8.0000 mg | ORAL_TABLET | Freq: Three times a day (TID) | ORAL | 0 refills | Status: AC | PRN

## 2023-10-29 MED ORDER — PREDNISONE 50 MG PO TABS: ORAL_TABLET | ORAL | 0 refills | Status: DC

## 2023-10-29 MED ORDER — LIDOCAINE VISCOUS HCL 2 % MT SOLN: OROMUCOSAL | 0 refills | Status: AC

## 2023-10-29 NOTE — Progress Notes (Signed)
Noted  

## 2023-10-29 NOTE — Progress Notes (Signed)
10/29/2023 Paula [name] [id number] 10/08/1961  Referring provider: Tally Joe, MD Primary GI doctor: Dr. Marina Goodell  ASSESSMENT AND PLAN:  Abdominal Pain and Diarrhea New onset of severe abdominal pain and diarrhea for two weeks. Pain is localized to the upper abdomen and is worse with movement and at night. Diarrhea is associated with food intake. Recent history of steroid use for arthritis. - check diatherix for cdiff and GI profile with upper and lower AB symptoms and recent ABX -Order labs to evaluate for pancreatitis and diverticulitis. -Order CT scan with oral contrast due to patient's allergy. -Prescribe medication for discomfort and nausea. -Advise on a soft, low-fat diet and increased fluid intake. -If symptoms worsen, patient is advised to go to the ER.  Gastroesophageal Reflux Disease (GERD) Controlled with Dexilant. -Continue Dexilant for GERD management.  Fatty Liver - need LFTs and CBC monitored every 6 months, revaluation every 2-3 years.  - Consider elastography --Continue to work on risk factor modification including diet exercise and control of risk factors including blood sugars.   Patient Care Team: Tally Joe, MD as PCP - General (Family Medicine) Thomasene Ripple, DO as PCP - Cardiology (Cardiology) Hilarie Fredrickson, MD (Gastroenterology) Coletta Memos, MD (Neurosurgery) Camilo, Delorise Royals, MD (Inactive) as Consulting Physician (Neurology)  HISTORY OF PRESENT ILLNESS: 62 y.o. female with a past medical history of reflux, IBS and others listed below presents for evaluation of diarrhea and abdominal pain.    05/15/2021 EGD with reflux and otherwise normal.   05/15/2021 colonoscopy with diverticulosis and internal hemorrhoids and otherwise normal. Repeat recommended 10 years.  06/20/2022 CT and pelvis without contrast for abdominal pain showed no acute abnormality, diverticulosis no bowel obstruction, fatty liver, aortic atherosclerosis 05/27/2023 office  visit with Hyacinth Meeker, PA for external hemorrhoids and reflux.  Patient prescribed hydrocortisone ointment told to discontinue pantoprazole and do just Dexilant with Pepcid at night as needed follow-up as needed 01/28/2023 modified barium swallow no aspiration or penetration but did suggest outpatient follow-up with speech therapy.  She states last 2 weeks she has had epigastric pain, nausea, feels like something is going to "push through", can have some radiation to her back.  Worse at night, keeping her from sleeping. Worse with movements. She has had liquid diet past 2-3 days, having decreased appetite, and lost 2-3 lbs in last 2 weeks.  She is having diarrhea with anything that she eats.  She has had some fever/chills, thinks could be menopause.  Dexilant is controlling her GERD, no dysphagia, vomiting.   Stool is orange/ gold color.  She just finished zpak and prednisone did 6 days course for cellulitis, no NSAIDS.  Rare ETOH.  She was on ozempic, started in Feb/May, she stopped it about 3 months ago.  She quit smoking 2009.   Discussed the use of AI scribe software for clinical note transcription with the patient, who gave verbal consent to proceed.  The patient, with a history of diverticulosis and fatty liver, presents with a two-week history of localized abdominal pain, nausea, and diarrhea. The discomfort is described as a constant, intense sensation in the upper abdomen, likened to labor pain, which is exacerbated by movement and prevents restful sleep. The patient also reports a sensation of fullness or swelling in the upper abdomen, which has been progressively increasing.  Despite being on Dexilant, a proton pump inhibitor, for presumed reflux disease, the patient has been experiencing these symptoms. However, she reports that the Dexilant effectively controls her heartburn. The  patient denies any vomiting, attributing this to her general resistance to vomiting unless severely  ill.  The patient's bowel habits have significantly changed, with any food or drink intake leading to immediate diarrhea. This has resulted in a weight loss of approximately 3-4 pounds over the past two weeks. The patient describes the stool as an unusual 'orangey gold' color.  The patient recently completed a course of Z-Pak and steroids for arthritis in the knees. She also reports a history of smoking, which ceased in 2010. The patient was previously on Ozempic, which was discontinued approximately three months ago due to weight gain.  The patient's symptoms have significantly impacted her daily life, to the extent that she has been unable to eat for the past two days, subsisting only on crackers and ginger ale. The patient also reports difficulty sleeping due to the discomfort.     She  reports that she quit smoking about 15 years ago. Her smoking use included cigarettes. She started smoking about 40 years ago. She has a 12.5 pack-year smoking history. She has never used smokeless tobacco. She reports current alcohol use. She reports that she does not use drugs.  RELEVANT LABS AND IMAGING: CBC    Component Value Date/Time   WBC 9.2 11/08/2022 1400   RBC 4.41 11/08/2022 1400   HGB 13.3 11/08/2022 1434   HCT 39.0 11/08/2022 1434   PLT 353 11/08/2022 1400   MCV 89.1 11/08/2022 1400   MCH 29.7 11/08/2022 1400   MCHC 33.3 11/08/2022 1400   RDW 12.1 11/08/2022 1400   LYMPHSABS 4.3 (H) 11/08/2022 1400   MONOABS 0.6 11/08/2022 1400   EOSABS 0.3 11/08/2022 1400   BASOSABS 0.1 11/08/2022 1400   Recent Labs    11/08/22 1400 11/08/22 1434  HGB 13.1 13.3    CMP     Component Value Date/Time   NA 141 11/08/2022 1434   NA 139 12/10/2021 1058   K 4.1 11/08/2022 1434   CL 107 11/08/2022 1434   CO2 26 11/08/2022 1400   GLUCOSE 92 11/08/2022 1434   BUN 11 11/08/2022 1434   BUN 11 12/10/2021 1058   CREATININE 0.80 11/08/2022 1434   CALCIUM 10.1 11/08/2022 1400   PROT 7.2 11/08/2022  1400   PROT 7.4 08/12/2018 1143   ALBUMIN 4.2 11/08/2022 1400   ALBUMIN 4.9 08/12/2018 1143   AST 31 11/08/2022 1400   ALT 33 11/08/2022 1400   ALKPHOS 68 11/08/2022 1400   BILITOT 0.6 11/08/2022 1400   BILITOT 0.4 08/12/2018 1143   GFRNONAA >60 11/08/2022 1400   GFRAA >60 07/01/2020 0206      Latest Ref Rng & Units 11/08/2022    2:00 PM 06/20/2022    2:18 AM 09/14/2021    5:51 PM  Hepatic Function  Total Protein 6.5 - 8.1 g/dL 7.2  7.5  7.8   Albumin 3.5 - 5.0 g/dL 4.2  4.6  4.7   AST 15 - 41 U/L 31  22  32   ALT 0 - 44 U/L 33  27  40   Alk Phosphatase 38 - 126 U/L 68  72  72   Total Bilirubin 0.3 - 1.2 mg/dL 0.6  0.4  0.2       Current Medications:   Current Outpatient Medications (Endocrine & Metabolic):    predniSONE (DELTASONE) 50 MG tablet, Give 13 hours, 7 hours, and 1 hour prior to contrast dye injection. Notify pharmacy to schedule dose as appropriate based on procedure date/time.   Semaglutide,0.25  or 0.5MG /DOS, (OZEMPIC, 0.25 OR 0.5 MG/DOSE,) 2 MG/1.5ML SOPN, Inject 0.5 mg into the skin once a week. (Patient not taking: Reported on 10/29/2023)   Current Outpatient Medications (Cardiovascular):    Evolocumab (REPATHA SURECLICK) 140 MG/ML SOAJ, INJECT (140MG ) INTO THE SKIN EVERY 14 DAYS   Current Outpatient Medications (Respiratory):    albuterol (VENTOLIN HFA) 108 (90 Base) MCG/ACT inhaler, 2 puffs every 6 (six) hours as needed for shortness of breath or wheezing.   benzonatate (TESSALON) 100 MG capsule, Take 1 capsule (100 mg total) by mouth every 8 (eight) hours.   fluticasone (FLONASE) 50 MCG/ACT nasal spray, Place 2 sprays into both nostrils as needed.   SYMBICORT 160-4.5 MCG/ACT inhaler, as needed.   Current Outpatient Medications (Analgesics):    acetaminophen (TYLENOL) 500 MG tablet, Take 1,000 mg by mouth every 6 (six) hours as needed for headache (pain).   Current Outpatient Medications (Hematological):    Cyanocobalamin (VITAMIN B 12) 500 MCG  TABS, 1 tablet   Current Outpatient Medications (Other):    GI Cocktail (alum & mag hydroxide, lidocaine, dicyclomine) oral mixture, 90 ml 2 % Viscous Lisocaine, 90 ml Dicyclomine 10mg /5 ml, maalox 400 mg 5 ml by mouth every 8 hours as needed for AB pain   ondansetron (ZOFRAN-ODT) 8 MG disintegrating tablet, Take 1 tablet (8 mg total) by mouth every 8 (eight) hours as needed for nausea or vomiting.   Cholecalciferol (VITAMIN D-1000 MAX ST) 25 MCG (1000 UT) tablet, Take by mouth.   cyclobenzaprine (FLEXERIL) 10 MG tablet, Take 1 tablet by mouth at bedtime as needed for muscle spasms.   dexlansoprazole (DEXILANT) 60 MG capsule, Take 60 mg by mouth at bedtime.   dicyclomine (BENTYL) 20 MG tablet, Take 1 tablet (20 mg total) by mouth 2 (two) times daily.   pantoprazole (PROTONIX) 40 MG tablet, Take by mouth.   phenazopyridine (PYRIDIUM) 95 MG tablet, Take 1 tablet (95 mg total) by mouth 3 (three) times daily as needed for pain.   Vitamin E 670 MG (1000 UT) CAPS, Take by mouth.  Current Facility-Administered Medications (Other):    0.9 %  sodium chloride infusion  Medical History:  Past Medical History:  Diagnosis Date   Anxiety    pt stated anxiety episodes resemble stroke symptoms   Chronic lower back pain    Conversion disorder    Depression    Diverticulitis    Expressive aphasia 11/15/2016   Fibromyalgia    GERD (gastroesophageal reflux disease)    Heart murmur    High cholesterol    History of gastric ulcer    History of hiatal hernia    History of kidney stones    History of thyroid nodule    Hyperlipidemia    Hypertension    IBS (irritable bowel syndrome)    Intermittent vertigo    Migraine    "a few times/year now; maybe" (11/15/2016)   Obesity    OSA on CPAP    wears CPAP nightly ;study in epic 02-01-2014 (11/15/2016)   Osteoarthritis    "knees, hands, back, hips" (11/15/2016)   Pneumonia X 1   hx of   Psychogenic tremor    intermittant head  tremor-(11/15/2016)   Refusal of blood transfusions as patient is Jehovah's Witness    RLS (restless legs syndrome)    Sleep apnea    CPAP   Urinary incontinence    Vertigo    Allergies:  Allergies  Allergen Reactions   Detrol [Tolterodine] Other (See Comments)  slurred speech, tremors, dizziness   Gabapentin Palpitations and Other (See Comments)    Wt gain, tremor   Aspirin Other (See Comments)    Avoids--- history of gastric ulcers    Contrast Media [Iodinated Contrast Media] Hives, Itching and Rash    CT contrast-Needed to take benadryl    Imitrex [Sumatriptan] Other (See Comments)    Body hurts   Oxycodone Nausea And Vomiting   Milk-Related Compounds Other (See Comments)   Rosuvastatin Other (See Comments)   Vicodin Hp [Hydrocodone-Acetaminophen] Nausea And Vomiting   Avelox [Moxifloxacin] Itching     Surgical History:  She  has a past surgical history that includes Cesarean section (7169; 1982); Anterior cervical decomp/discectomy fusion (02/10/2004); Umbilical hernia repair (age 54); Cardiovascular stress test (2017); Cystoscopy with ureteroscopy and stent placement (Right, 06/07/2015); Holmium laser application (N/A, 06/07/2015); Cystoscopy w/ retrogrades (Right, 06/07/2015); Upper gi endoscopy; Colonoscopy; Back surgery; Laparoscopic cholecystectomy (10/07/2000); Hernia repair; Abdominal hysterectomy (1990); Lipoma excision (01/2017); and Direct laryngoscopy (N/A, 10/29/2021). Family History:  Her family history includes Atrial fibrillation in her father; CVA in her maternal grandmother; Colon cancer in her father, maternal aunt, maternal grandfather, and paternal grandfather; Heart disease in her father; Hyperlipidemia in her mother; Hypertension in her brother, father, maternal grandfather, maternal grandmother, mother, and sister; Lung cancer in her maternal aunt and maternal grandfather; Osteoporosis in her mother; Pancreatic cancer in her maternal grandfather; Sleep apnea  in her brother, mother, and sister; Syncope episode in her father; Throat cancer in her maternal grandfather.  REVIEW OF SYSTEMS  : All other systems reviewed and negative except where noted in the History of Present Illness.  PHYSICAL EXAM: BP 124/86 (BP Location: Left Arm, Patient Position: Sitting, Cuff Size: Normal)   Pulse 70   Ht 5\' 3"  (1.6 m)   Wt 259 lb (117.5 kg)   SpO2 97%   BMI 45.88 kg/m  General Appearance: obese, in no apparent distress. Head:   Normocephalic and atraumatic. Eyes:  sclerae anicteric,conjunctive pink  Respiratory: Respiratory effort normal, BS equal bilaterally without rales, rhonchi, wheezing. Cardio: RRR with no MRGs. Peripheral pulses intact.  Abdomen: Soft,  Obese ,active bowel sounds. moderate- marked tenderness in the epigastrium and in the RLQ. With guarding and Without rebound. No masses. Rectal: declines Musculoskeletal: Full ROM, Normal gait. Without edema. Skin:  Dry and intact without significant lesions or rashes Neuro: Alert and  oriented x4;  No focal deficits. Psych:  Cooperative. Normal mood and affect.    Doree Albee, PA-C 12:33 PM

## 2023-10-29 NOTE — Patient Instructions (Addendum)
Your provider has ordered "Diathe[employer]" stool testing for you. You have received a kit from our office today containing all necessary supplies to complete this test. Please carefully read the stool collection instructions provided in the kit before opening the accompanying materials. In addition, be sure to place the label from the top right corner of the laboratory request sheet onto the "puritan opti-swab" tube that is supplied in the kit. This label should include your full name and date of birth. After completing the test, you should secure the purtian tube into the specimen biohazard bag. The laboratory request information sheet (including date and time of specimen collection) should be placed into the outside pocket of the specimen biohazard bag and returned to the [facility] lab with 2 days of collection.   If the laboratory information sheet specimen date and time are not filled out, the test will NOT be performed.    You have been scheduled for a CT scan of the abdomen and pelvis at [facility] ([address]---this is in the same building as [facility] [employer] [facility]).   You are scheduled on TODAY  10/28/2024 at 5:00 pm  You should arrive 30 minutes prior to your appointment time for registration. Please follow the written instructions below on the day of your exam:  ________________________________________________________________________  Due to recent changes in healthcare laws, you may see the results of your imaging and laboratory studies on MyChart before your provider has had a chance to review them.  We understand that in some cases there may be results that are confusing or concerning to you. Not all laboratory results come back in the same time frame and the provider may be waiting for multiple results in order to interpret others.  Please give [place] 48 hours in order for your provider to thoroughly review all the results before contacting the office for clarification of your  results.    _______________________________________________________  If your blood pressure at your visit was 140/90 or greater, please contact your primary care physician to follow up on this.  _______________________________________________________  If you are age 63 or older, your body mass index should be between 23-30. Your Body mass index is 45.88 kg/m. If this is out of the aforementioned range listed, please consider follow up with your Primary Care Provider.  If you are age 28 or younger, your body mass index should be between 19-25. Your Body mass index is 45.88 kg/m. If this is out of the aformentioned range listed, please consider follow up with your Primary Care Provider.   ________________________________________________________  The [facility] providers would like to encourage you to use [facility] to communicate with providers for non-urgent requests or questions.  Due to long hold times on the telephone, sending your provider a message by [facility] may be a faster and more efficient way to get a response.  Please allow 48 business hours for a response.  Please remember that this is for non-urgent requests.  _______________________________________________________

## 2023-11-03 ENCOUNTER — Telehealth: Payer: Self-pay | Admitting: Physician Assistant

## 2023-11-03 NOTE — Telephone Encounter (Signed)
Line busy

## 2023-11-03 NOTE — Telephone Encounter (Signed)
PT is calling to advise that she still has no relief with the epigastric pain and was wondering were there any other alternatives for relief. Please advise.

## 2023-11-03 NOTE — Telephone Encounter (Signed)
Patient calling to f/u on previous note. Please advise. 

## 2023-11-06 MED ORDER — SUCRALFATE 1 G PO TABS: 1.0000 g | ORAL_TABLET | Freq: Three times a day (TID) | ORAL | 0 refills | Status: AC

## 2023-11-06 NOTE — Telephone Encounter (Signed)
See mychart messages

## 2023-11-09 ENCOUNTER — Ambulatory Visit
Admission: EM | Admit: 2023-11-09 | Discharge: 2023-11-09 | Disposition: A | Discharge status: Home / Self Care | Payer: Medicare PPO

## 2023-11-09 DIAGNOSIS — G2581 Restless legs syndrome: Secondary | ICD-10-CM | POA: Diagnosis not present

## 2023-11-09 DIAGNOSIS — R29818 Other symptoms and signs involving the nervous system: Secondary | ICD-10-CM

## 2023-11-09 DIAGNOSIS — H539 Unspecified visual disturbance: Secondary | ICD-10-CM

## 2023-11-09 MED ORDER — ROPINIROLE HCL 2 MG PO TABS: 2.0000 mg | ORAL_TABLET | Freq: Every day | ORAL | 0 refills | Status: DC

## 2023-11-09 NOTE — ED Triage Notes (Signed)
"  I had cataract surgery last year, I woke up this morning and see a spider looking thing behind my left eye, I feel like what ever it is has broken up in it, I want to make sure it isn't something urgent". No pain in eye. "Something is obstructing my view and it it".

## 2023-11-09 NOTE — ED Provider Notes (Signed)
[facility]    CSN: [id number] Arrival date & time: 11/09/23  1012      History   Chief Complaint Chief Complaint  Patient presents with   Eye Problem    HPI Paula [name] is a 62 y.o. female.   Pleasant 62 year old female presents today due to concerns of a left eye problem.  She states that yesterday, she saw a black spot that looked like a spider in her vision.  She denied vision loss, or amaurosis fugax, but reported what looked like a spider.  She states today it "looks broken up", and seems to be resolving.  She also reports tearing to her left eye. She denies eye pain, loss of vision, redness, mucopurulent discharge, photophobia, fever, swelling of lid. She does have a rather extensive history of having stroke-like symptoms after her eyes have been evaluated. Pt reports this has been ongoing for the past several years and is unchanged from her baseline. During the exam today, patient states "it will look like I will have a stroke here in a minute, I will have slurred speech, my eyes will cross, and I will start to have a tremor."  From a thorough chart review, it looks like this has been evaluated extensively with numerous MRIs and CTs in the past.  She also has followed with neurology.  Patient states she has been tried on numerous medications including psychiatric and muscle relaxer medications, none of which have worked.  Patient states she also has restless leg syndrome, but states she "does it on purpose to help her sleep".  She is concerned with her continued neurological symptoms particularly given her ocular response to light.  She did have cataract surgery in her left eye 1 year ago, but has never actually seen a neuro-ophthalmologist.  Today she states that closing her left eyelid does cause some improvement in her symptoms.  She feels like her eyes get fatigued.  She also reports that bright lights seem to stimulate her neurological, strokelike symptoms. Pt has a  documented hx of anxiety, conversion disorder, psychogenic tremor and fibromyalgia. She does not smoke and denies hx of blood clot.     Past Medical History:  Diagnosis Date   Anxiety    pt stated anxiety episodes resemble stroke symptoms   Chronic lower back pain    Conversion disorder    Depression    Diverticulitis    Expressive aphasia 11/15/2016   Fibromyalgia    GERD (gastroesophageal reflux disease)    Heart murmur    High cholesterol    History of gastric ulcer    History of hiatal hernia    History of kidney stones    History of thyroid nodule    Hyperlipidemia    Hypertension    IBS (irritable bowel syndrome)    Intermittent vertigo    Migraine    "a few times/year now; maybe" (11/15/2016)   Obesity    OSA on CPAP    wears CPAP nightly ;study in epic 02-01-2014 (11/15/2016)   Osteoarthritis    "knees, hands, back, hips" (11/15/2016)   Pneumonia X 1   hx of   Psychogenic tremor    intermittant head tremor-(11/15/2016)   Refusal of blood transfusions as patient is Jehovah's Witness    RLS (restless legs syndrome)    Sleep apnea    CPAP   Urinary incontinence    Vertigo     Patient Active Problem List   Diagnosis Date Noted   Precordial  pain 12/07/2021   Chest pain 12/07/2021   Prediabetes 12/07/2021   Preprocedural examination 12/07/2021   Hyperlipidemia 12/07/2021   Palpitations 12/07/2021   Physical deconditioning 08/22/2021   History of COVID-19 01/04/2021   Adult onset stuttering 06/30/2020   Migraine 06/30/2020   Fibromyalgia 06/30/2020   Type 2 diabetes mellitus without complication (HCC) 06/30/2020   TIA (transient ischemic attack) 06/30/2020   Atypical chest pain 08/13/2018   Depression, major 03/17/2018   Chronic pain syndrome 03/17/2018   Expressive aphasia 11/15/2016   Chronic cough 11/08/2015   B12 deficiency 12/07/2014   Wart viral 11/07/2014   Coarse tremors 02/09/2014   OSA on CPAP 01/28/2014   Functional neurological symptom  disorder (conversion disorder), with abnormal movement 01/14/2014   Dyspnea 01/12/2014   Tremor of face and hands 01/12/2014   Breast pain in female 02/09/2013   Elevated WBC count 10/12/2012   Abdominal pain, other specified site 10/09/2012   Abdominal bloating 10/09/2012   Ovarian cystic mass 05/25/2012   Shoulder pain, right 03/11/2012   Right arm pain 12/30/2011   Acute sinusitis 07/30/2010   ACNE, ROSACEA 07/17/2010   LEG PAIN 07/17/2010   NECK PAIN 05/10/2010   ABDOMINAL PAIN, EPIGASTRIC 05/10/2010   BURSITIS, LEFT HIP 05/01/2010   PLANTAR FASCIITIS, BILATERAL 05/01/2010   HYPERGLYCEMIA 01/05/2010   PRURITUS 11/27/2009   TOBACCO USE, QUIT 11/27/2009   Anxiety state 11/22/2008   FATIGUE 11/22/2008   Essential hypertension 10/07/2008   Insomnia, unspecified 08/30/2008   Obesity 08/19/2008   NAUSEA ALONE 05/02/2008   VERTIGO 01/19/2008   DIARRHEA 01/19/2008   THYROID NODULE 06/23/2007   Dyslipidemia 06/23/2007   Situational depression 06/23/2007   ADD 06/23/2007   TMJ SYNDROME 06/23/2007   GERD 06/23/2007   Irritable bowel syndrome 06/23/2007   Headache 06/23/2007    Past Surgical History:  Procedure Laterality Date   ABDOMINAL HYSTERECTOMY  1990   Partial, still has ovaries   ANTERIOR CERVICAL DECOMP/DISCECTOMY FUSION  02/10/2004   C4 -- C7   BACK SURGERY     CARDIOVASCULAR STRESS TEST  2017   Negative   CESAREAN SECTION  1977; 1982   COLONOSCOPY     CYSTOSCOPY W/ RETROGRADES Right 06/07/2015   Procedure: CYSTOSCOPY WITH RETROGRADE PYELOGRAM;  Surgeon: Crist Fat, MD;  Location: [facility];  Service: Urology;  Laterality: Right;   CYSTOSCOPY WITH URETEROSCOPY AND STENT PLACEMENT Right 06/07/2015   Procedure: RIGHT URETEROSCOPY, LASER LITHOTRIPSY, STONE REMOVAL AND RIGHT URETERAL STENT PLACEMENT;  Surgeon: Crist Fat, MD;  Location: [facility];  Service: Urology;  Laterality: Right;   DIRECT LARYNGOSCOPY N/A  10/29/2021   Procedure: DIRECT LARYNGOSCOPY WITH BIOPSY;  Surgeon: Christia Reading, MD;  Location: [facility] OR;  Service: ENT;  Laterality: N/A;   HERNIA REPAIR     HOLMIUM LASER APPLICATION N/A 06/07/2015   Procedure: HOLMIUM LASER APPLICATION;  Surgeon: Crist Fat, MD;  Location: [facility];  Service: Urology;  Laterality: N/A;   LAPAROSCOPIC CHOLECYSTECTOMY  10/07/2000   LIPOMA EXCISION  01/2017   from neck   UMBILICAL HERNIA REPAIR  age 32   UPPER GI ENDOSCOPY      OB History     Gravida  3   Para  2   Term  2   Preterm      AB  1   Living  2      SAB      IAB  1   Ectopic  Multiple      Live Births  2            Home Medications    Prior to Admission medications   Medication Sig Start Date End Date Taking? Authorizing Provider  acetaminophen (TYLENOL) 500 MG tablet Take 1,000 mg by mouth every 6 (six) hours as needed for headache (pain).   Yes [provider]  benzonatate (TESSALON) 100 MG capsule Take 1 capsule (100 mg total) by mouth every 8 (eight) hours. 06/08/22  Yes Avegno, Zachery Dakins, FNP  cyclobenzaprine (FLEXERIL) 10 MG tablet Take 1 tablet by mouth at bedtime as needed for muscle spasms.   Yes [provider]  dexlansoprazole (DEXILANT) 60 MG capsule Take 60 mg by mouth at bedtime. 09/17/21  Yes [provider]  fluticasone (FLONASE) 50 MCG/ACT nasal spray Place 2 sprays into both nostrils as needed. 07/03/22  Yes [provider]  pantoprazole (PROTONIX) 40 MG tablet Take by mouth. 05/30/22  Yes [provider]  rOPINIRole (REQUIP) 2 MG tablet Take 1 tablet (2 mg total) by mouth at bedtime. 11/09/23  Yes Amahri Dengel, Whitney L, PA  Semaglutide,0.25 or 0.5MG /DOS, (OZEMPIC, 0.25 OR 0.5 MG/DOSE,) 2 MG/1.5ML SOPN Inject 0.5 mg into the skin once a week.   Yes [provider]  SYMBICORT 160-4.5 MCG/ACT inhaler as needed. 07/04/22  Yes [provider]  Vitamin E 670 MG (1000 UT) CAPS  Take by mouth.   Yes [provider]  ACCU-CHEK GUIDE TEST test strip 1 each by Other route as needed. 11/06/23   [provider]  Accu-Chek Softclix Lancets lancets daily.    [provider]  albuterol (VENTOLIN HFA) 108 (90 Base) MCG/ACT inhaler 2 puffs every 6 (six) hours as needed for shortness of breath or wheezing. 01/11/21   [provider]  Blood Glucose Monitoring Suppl (ACCU-CHEK GUIDE) w/Device KIT USE AS DIRECTED ONCE DAILY TO CHECK BLOOD SUGAR LEVEL 30 DAYS    [provider]  Cholecalciferol (VITAMIN D-1000 MAX ST) 25 MCG (1000 UT) tablet Take by mouth.    [provider]  Cyanocobalamin (VITAMIN B 12) 500 MCG TABS 1 tablet 03/19/22   [provider]  dicyclomine (BENTYL) 20 MG tablet Take 1 tablet (20 mg total) by mouth 2 (two) times daily. 10/29/23   Doree Albee, PA-C  Evolocumab (REPATHA SURECLICK) 140 MG/ML SOAJ INJECT (140MG ) INTO THE SKIN EVERY 14 DAYS 11/18/22   Hilty, Lisette Abu, MD  GI Cocktail (alum & mag hydroxide, lidocaine, dicyclomine) oral mixture 90 ml 2 % Viscous Lisocaine, 90 ml Dicyclomine 10mg /5 ml, maalox 400 mg 5 ml by mouth every 8 hours as needed for AB pain 10/29/23   Doree Albee, PA-C  methylPREDNISolone (MEDROL DOSEPAK) 4 MG TBPK tablet Take 4 mg by mouth as directed. 10/22/23   [provider]  ondansetron (ZOFRAN-ODT) 8 MG disintegrating tablet Take 1 tablet (8 mg total) by mouth every 8 (eight) hours as needed for nausea or vomiting. 10/29/23   Doree Albee, PA-C  phenazopyridine (PYRIDIUM) 95 MG tablet Take 1 tablet (95 mg total) by mouth 3 (three) times daily as needed for pain. 02/20/23   Selmer Dominion, NP  predniSONE (DELTASONE) 50 MG tablet Give 13 hours, 7 hours, and 1 hour prior to contrast dye injection. Notify pharmacy to schedule dose as appropriate based on procedure date/time. 10/29/23   Doree Albee, PA-C  sucralfate (CARAFATE) 1 g tablet Take 1  tablet (1 g total)  by mouth 4 (four) times daily -  with meals and at bedtime. 11/06/23   Doree Albee, PA-C  traMADol (ULTRAM) 50 MG tablet Take 50 mg by mouth 3 (three) times daily as needed. 10/22/23   [provider]    Family History Family History  Problem Relation Age of Onset   Hyperlipidemia Mother    Hypertension Mother    Sleep apnea Mother    Osteoporosis Mother    Hypertension Father    Heart disease Father    Syncope episode Father    Colon cancer Father    Atrial fibrillation Father    Hypertension Sister    Sleep apnea Sister    Sleep apnea Brother    Hypertension Brother    Colon cancer Maternal Aunt    Lung cancer Maternal Aunt    Hypertension Maternal Grandmother    CVA Maternal Grandmother    Pancreatic cancer Maternal Grandfather    Colon cancer Maternal Grandfather    Hypertension Maternal Grandfather    Lung cancer Maternal Grandfather    Throat cancer Maternal Grandfather    Colon cancer Paternal Grandfather    Esophageal cancer Neg Hx    Liver cancer Neg Hx    Stomach cancer Neg Hx     Social History Social History   Tobacco Use   Smoking status: Former    Current packs/day: 0.00    Average packs/day: 0.5 packs/day for 25.0 years (12.5 ttl pk-yrs)    Types: Cigarettes    Start date: 07/25/1983    Quit date: 07/24/2008    Years since quitting: 15.3   Smokeless tobacco: Never  Vaping Use   Vaping status: Never Used  Substance Use Topics   Alcohol use: Yes    Comment: 5 drinks per year or less   Drug use: No     Allergies   Detrol [tolterodine], Gabapentin, Aspirin, Contrast media [iodinated contrast media], Imitrex [sumatriptan], Oxycodone, Milk-related compounds, Rosuvastatin, Vicodin hp [hydrocodone-acetaminophen], and Avelox [moxifloxacin]   Review of Systems Review of Systems As per HPI  Physical Exam Triage Vital Signs ED Triage Vitals  Encounter Vitals Group     BP 11/09/23 1041 127/81     Systolic BP  Percentile --      Diastolic BP Percentile --      Pulse Rate 11/09/23 1041 72     Resp 11/09/23 1041 20     Temp 11/09/23 1041 98.1 F (36.7 C)     Temp Source 11/09/23 1041 Oral     SpO2 11/09/23 1041 98 %     Weight 11/09/23 1037 258 lb (117 kg)     Height 11/09/23 1037 5\' 4"  (1.626 m)     Head Circumference --      Peak Flow --      Pain Score 11/09/23 1034 0     Pain Loc --      Pain Education --      Exclude from Growth Chart --    No data found.  Updated Vital Signs BP 127/81 (BP Location: Left Arm)   Pulse 72   Temp 98.1 F (36.7 C) (Oral)   Resp 20   Ht 5\' 4"  (1.626 m)   Wt 258 lb (117 kg)   SpO2 98%   BMI 44.29 kg/m   Visual Acuity Right Eye Distance: Unable to obtain Left Eye Distance: Unable to obtain Bilateral Distance: Unable to obtain  Right Eye Near:   Left Eye Near:    Bilateral Near:  Physical Exam Vitals and nursing note reviewed.  Constitutional:      General: She is not in acute distress.    Appearance: Normal appearance. She is obese. She is not ill-appearing, toxic-appearing or diaphoretic.  HENT:     Head: Normocephalic and atraumatic.     Right Ear: Tympanic membrane, ear canal and external ear normal. There is no impacted cerumen.     Left Ear: Tympanic membrane, ear canal and external ear normal. There is no impacted cerumen.     Nose: Nose normal. No congestion or rhinorrhea.     Mouth/Throat:     Mouth: Mucous membranes are moist.     Pharynx: Oropharynx is clear. No oropharyngeal exudate or posterior oropharyngeal erythema.  Eyes:     General: Lids are normal. Lids are everted, no foreign bodies appreciated. Vision grossly intact. No allergic shiner, visual field deficit or scleral icterus.       Right eye: No foreign body, discharge or hordeolum.        Left eye: No foreign body, discharge or hordeolum.     Extraocular Movements: Extraocular movements intact.     Right eye: Normal extraocular motion.     Left eye: Normal  extraocular motion.     Conjunctiva/sclera:     Right eye: Right conjunctiva is not injected. No chemosis, exudate or hemorrhage.    Left eye: Left conjunctiva is not injected. No chemosis, exudate or hemorrhage.    Pupils: Pupils are equal, round, and reactive to light. Pupils are equal.     Right eye: Pupil is round, reactive and not sluggish.     Left eye: Pupil is round, reactive and not sluggish.     Funduscopic exam:    Right eye: No hemorrhage, exudate, AV nicking or papilledema. Red reflex present.        Left eye: No hemorrhage, exudate, AV nicking or papilledema. Red reflex present.    Visual Fields: Right eye visual fields normal and left eye visual fields normal.     Comments: Near vision tested by provider in exam room, WNL Pt had a completely normal ophthalmic and neurological exam until the light was shined into her L eye.  Cardiovascular:     Rate and Rhythm: Normal rate and regular rhythm.     Pulses: Normal pulses.  Pulmonary:     Effort: Pulmonary effort is normal. No respiratory distress.     Breath sounds: Normal breath sounds. No stridor. No wheezing or rhonchi.  Neurological:     General: No focal deficit present.     Mental Status: She is alert.     Comments: Pt had a normal neuro exam initially, However after pupillary constriction with shining a light in the left eye, pt started having R sided facial weakness, slurred speech, eyes became crossed, pt started crying and having a head tremor. Pulses were intact. Strength in BUE remained intact.      UC Treatments / Results  Labs (all labs ordered are listed, but only abnormal results are displayed) Labs Reviewed - No data to display  EKG   Radiology No results found.  Procedures Procedures (including critical care time)  Medications Ordered in UC Medications - No data to display  Initial Impression / Assessment and Plan / UC Course  I have reviewed the triage vital signs and the nursing  notes.  Pertinent labs & imaging results that were available during my care of the patient were reviewed by me and considered in my medical  decision making (see chart for details).     Vision disturbance - pt having vague symptoms which sound like a resolving aura without the migraine. She however has been having undiagnosed neurological symptoms for the past several years which seem to be stimulated by pupillary constriction of the left eye. She has had numerous "code stroke" work ups, which have been normal. She is aware of what symptoms she will have and when she will have them. Her symptoms in office today are chronic, she states she is on disability for them as they interfere with her daily functions. I see no evidence of infection on eye exam. I would recommend that she call Neuro-ophthalmology for a complete workup. MRIs have been negative however I question possible metabolic or inflammatory thalamus cause of her symptoms? Workup not appropriate in the UC setting, pt aware of the need for specialist consultation. Neurological finding - pt had abnormal neuro exam in office after pupillary constriction of L eye, however this is at pt's baseline. She admitted to these chronic, recurrent sx which have been dx by neurology as psychogenic. She is very bothered by them and states they have not responded to psychotropic medications nor muscle relaxers. Pt denied any new sx that she had not experienced before during her encounter. RLS - this is chronic, not currently being managed. Given her tremors as well, will have pt do trial of requip at night time only. Pt will need to get further refills from neurology if tolerated and beneficial.   Total time spent in chart review, face to face, and documentation was 45 minutes. Final Clinical Impressions(s) / UC Diagnoses   Final diagnoses:  Vision disturbance  Neurological finding  RLS (restless legs syndrome)     Discharge Instructions      Please  call tomorrow: [facility]  Roderic Ovens, MD Neuro-Ophthalmology [facility] [id number] [place], [place] [address]  [phone].    ED Prescriptions     Medication Sig Dispense Auth. Provider   rOPINIRole (REQUIP) 2 MG tablet Take 1 tablet (2 mg total) by mouth at bedtime. 14 tablet Cheng Dec, Whitney L, [place]      PDMP not reviewed this encounter.   Maretta Bees, [place] 11/10/23 [phone]

## 2023-11-09 NOTE — Discharge Instructions (Signed)
Please call tomorrow: [facility]  Roderic Ovens, MD Neuro-Ophthalmology [facility] [phone] [place], [place] [address]  [phone].

## 2023-11-10 ENCOUNTER — Emergency Department (HOSPITAL_BASED_OUTPATIENT_CLINIC_OR_DEPARTMENT_OTHER)
Admission: EM | Admit: 2023-11-10 | Discharge: 2023-11-10 | Disposition: A | Discharge status: Home / Self Care | Payer: Medicare PPO

## 2023-11-10 ENCOUNTER — Other Ambulatory Visit: Payer: Self-pay

## 2023-11-10 ENCOUNTER — Encounter (HOSPITAL_BASED_OUTPATIENT_CLINIC_OR_DEPARTMENT_OTHER): Payer: Self-pay | Admitting: Emergency Medicine

## 2023-11-10 ENCOUNTER — Emergency Department (HOSPITAL_BASED_OUTPATIENT_CLINIC_OR_DEPARTMENT_OTHER): Payer: Medicare PPO

## 2023-11-10 DIAGNOSIS — K760 Fatty (change of) liver, not elsewhere classified: Secondary | ICD-10-CM | POA: Diagnosis not present

## 2023-11-10 DIAGNOSIS — H5712 Ocular pain, left eye: Secondary | ICD-10-CM | POA: Diagnosis not present

## 2023-11-10 DIAGNOSIS — K5730 Diverticulosis of large intestine without perforation or abscess without bleeding: Secondary | ICD-10-CM | POA: Diagnosis not present

## 2023-11-10 DIAGNOSIS — H5710 Ocular pain, unspecified eye: Secondary | ICD-10-CM | POA: Diagnosis not present

## 2023-11-10 DIAGNOSIS — R1033 Periumbilical pain: Secondary | ICD-10-CM | POA: Diagnosis not present

## 2023-11-10 DIAGNOSIS — R1013 Epigastric pain: Secondary | ICD-10-CM | POA: Insufficient documentation

## 2023-11-10 DIAGNOSIS — R109 Unspecified abdominal pain: Secondary | ICD-10-CM | POA: Diagnosis present

## 2023-11-10 DIAGNOSIS — R197 Diarrhea, unspecified: Secondary | ICD-10-CM | POA: Insufficient documentation

## 2023-11-10 LAB — COMPREHENSIVE METABOLIC PANEL
ALT: 40 U/L (ref 0–44)
AST: 33 U/L (ref 15–41)
Albumin: 4.5 g/dL (ref 3.5–5.0)
Alkaline Phosphatase: 72 U/L (ref 38–126)
Anion gap: 8 (ref 5–15)
BUN: 10 mg/dL (ref 8–23)
CO2: 27 mmol/L (ref 22–32)
Calcium: 9.7 mg/dL (ref 8.9–10.3)
Chloride: 105 mmol/L (ref 98–111)
Creatinine, Ser: 0.79 mg/dL (ref 0.44–1.00)
GFR, Estimated: >60 mL/min (ref >60)
Glucose, Bld: 80 mg/dL (ref 70–99)
Potassium: 4.3 mmol/L (ref 3.5–5.1)
Sodium: 140 mmol/L (ref 135–145)
Total Bilirubin: 0.4 mg/dL (ref <1.2)
Total Protein: 7.6 g/dL (ref 6.5–8.1)

## 2023-11-10 LAB — URINALYSIS, ROUTINE W REFLEX MICROSCOPIC
Bilirubin Urine: NEGATIVE
Glucose, UA: NEGATIVE mg/dL
Hgb urine dipstick: NEGATIVE
Ketones, ur: NEGATIVE mg/dL
Leukocytes,Ua: NEGATIVE
Nitrite: NEGATIVE
Protein, ur: NEGATIVE mg/dL
Specific Gravity, Urine: 1.018 (ref 1.005–1.030)
pH: 5.5 (ref 5.0–8.0)

## 2023-11-10 LAB — CBC
HCT: 40.9 % (ref 36.0–46.0)
Hemoglobin: 13.1 g/dL (ref 12.0–15.0)
MCH: 28.8 pg (ref 26.0–34.0)
MCHC: 32 g/dL (ref 30.0–36.0)
MCV: 89.9 fL (ref 80.0–100.0)
Platelets: 326 10*3/uL (ref 150–400)
RBC: 4.55 MIL/uL (ref 3.87–5.11)
RDW: 12 % (ref 11.5–15.5)
WBC: 9.2 10*3/uL (ref 4.0–10.5)
nRBC: 0 % (ref 0.0–0.2)

## 2023-11-10 LAB — LIPASE, BLOOD: Lipase: 23 U/L (ref 11–51)

## 2023-11-10 MED ORDER — FLUORESCEIN SODIUM 1 MG OP STRP
1.0000 | ORAL_STRIP | Freq: Once | OPHTHALMIC | Status: AC
  Administered 2023-11-10: 1 via OPHTHALMIC
  Filled 2023-11-10: qty 1

## 2023-11-10 MED ORDER — TETRACAINE HCL 0.5 % OP SOLN
2.0000 [drp] | Freq: Once | OPHTHALMIC | Status: AC
  Administered 2023-11-10: 2 [drp] via OPHTHALMIC
  Filled 2023-11-10: qty 4

## 2023-11-10 MED ORDER — ONDANSETRON HCL 4 MG/2ML IJ SOLN
4.0000 mg | Freq: Once | INTRAMUSCULAR | Status: AC
  Administered 2023-11-10: 4 mg via INTRAVENOUS
  Filled 2023-11-10: qty 2

## 2023-11-10 MED ORDER — FAMOTIDINE 20 MG PO TABS
20.0000 mg | ORAL_TABLET | Freq: Once | ORAL | Status: AC
  Administered 2023-11-10: 20 mg via ORAL
  Filled 2023-11-10: qty 1

## 2023-11-10 MED ORDER — BARIUM SULFATE 2 % PO SUSP
450.0000 mL | Freq: Once | ORAL | Status: AC
  Administered 2023-11-10: 1 mL via ORAL

## 2023-11-10 MED ORDER — DICYCLOMINE HCL 10 MG PO CAPS
10.0000 mg | ORAL_CAPSULE | Freq: Once | ORAL | Status: AC
  Administered 2023-11-10: 10 mg via ORAL
  Filled 2023-11-10: qty 1

## 2023-11-10 MED ORDER — ALUM & MAG HYDROXIDE-SIMETH 200-200-20 MG/5ML PO SUSP
30.0000 mL | Freq: Once | ORAL | Status: AC
  Administered 2023-11-10: 30 mL via ORAL
  Filled 2023-11-10: qty 30

## 2023-11-10 NOTE — Discharge Instructions (Addendum)
It was a pleasure taking care of you today.  You were evaluated in the emergency room for discomfort in your left eye and abdominal pain.  Your blood work was unremarkable.  A CT scan of your abdomen did not show any acute abnormality. You were given a referral for a ophthalmologist, who will see you tomorrow at 8 AM.  Contact information has been provided.  Should you experience any new or worsening symptoms including significant blurry vision, difficulty balancing, persistent vomiting and worsening abdominal pain please return to the emergency room.

## 2023-11-10 NOTE — ED Notes (Signed)
Po challenge successful.

## 2023-11-10 NOTE — ED Notes (Signed)
Discharge paperwork given and verbally understood. 

## 2023-11-10 NOTE — ED Notes (Signed)
Unable to get blood work in triage.

## 2023-11-10 NOTE — ED Provider Notes (Signed)
[place] EMERGENCY DEPARTMENT AT [facility] Provider Note   CSN: [id number] Arrival date & time: 11/10/23  1136     History  Chief Complaint  Patient presents with   Eye Problem   Abdominal Pain    Paula [name] is a 63 y.o. female who presents with complaints of left eye watery discharge and abdominal pain and diarrhea.  She states on Saturday she noticed dark spot in her left eye, but has since broken up.  Endorses persistent watery discharge.  Planes of  " gritty" sensation but no pain.  Denies any vision changes.  Symptoms are improved with closing her left eye.  No history of blood clot.  Has a history of cataract surgery to her left eye a year ago.  She was evaluated by urgent care yesterday for these symptoms. No medications administered but recommended her follow-up with neuro-ophthalmologist.  She does have a history of vision disturbances that she has received extensive workup and diagnosed by neurology as psychogenic etiology.  She is currently on disability for the symptoms, however she reports current symptoms feel different than those.  Abdominal pain is epigastric and periumbilical in location.  Endorses diarrhea x 4 times a day for the past 3 weeks.  No one at home with similar symptoms.  No fevers, chills or systemic symptoms.  She feels nauseous without any vomiting.  Has a history of cholecystectomy.  Eye Problem Abdominal Pain      Home Medications Prior to Admission medications   Medication Sig Start Date End Date Taking? Authorizing Provider  ACCU-CHEK GUIDE TEST test strip 1 each by Other route as needed. 11/06/23   [provider]  Accu-Chek Softclix Lancets lancets daily.    [provider]  acetaminophen (TYLENOL) 500 MG tablet Take 1,000 mg by mouth every 6 (six) hours as needed for headache (pain).    [provider]  albuterol (VENTOLIN HFA) 108 (90 Base) MCG/ACT inhaler 2 puffs every 6 (six) hours as needed for  shortness of breath or wheezing. 01/11/21   [provider]  benzonatate (TESSALON) 100 MG capsule Take 1 capsule (100 mg total) by mouth every 8 (eight) hours. 06/08/22   Avegno, Zachery Dakins, FNP  Blood Glucose Monitoring Suppl (ACCU-CHEK GUIDE) w/Device KIT USE AS DIRECTED ONCE DAILY TO CHECK BLOOD SUGAR LEVEL 30 DAYS    [provider]  Cholecalciferol (VITAMIN D-1000 MAX ST) 25 MCG (1000 UT) tablet Take by mouth.    [provider]  Cyanocobalamin (VITAMIN B 12) 500 MCG TABS 1 tablet 03/19/22   [provider]  cyclobenzaprine (FLEXERIL) 10 MG tablet Take 1 tablet by mouth at bedtime as needed for muscle spasms.    [provider]  dexlansoprazole (DEXILANT) 60 MG capsule Take 60 mg by mouth at bedtime. 09/17/21   [provider]  dicyclomine (BENTYL) 20 MG tablet Take 1 tablet (20 mg total) by mouth 2 (two) times daily. 10/29/23   Doree Albee, PA-C  Evolocumab (REPATHA SURECLICK) 140 MG/ML SOAJ INJECT (140MG ) INTO THE SKIN EVERY 14 DAYS 11/18/22   Hilty, Lisette Abu, MD  fluticasone (FLONASE) 50 MCG/ACT nasal spray Place 2 sprays into both nostrils as needed. 07/03/22   [provider]  GI Cocktail (alum & mag hydroxide, lidocaine, dicyclomine) oral mixture 90 ml 2 % Viscous Lisocaine, 90 ml Dicyclomine 10mg /5 ml, maalox 400 mg 5 ml by mouth every 8 hours as needed for AB pain 10/29/23   Doree Albee,  PA-C  methylPREDNISolone (MEDROL DOSEPAK) 4 MG TBPK tablet Take 4 mg by mouth as directed. 10/22/23   [provider]  ondansetron (ZOFRAN-ODT) 8 MG disintegrating tablet Take 1 tablet (8 mg total) by mouth every 8 (eight) hours as needed for nausea or vomiting. 10/29/23   Doree Albee, PA-C  pantoprazole (PROTONIX) 40 MG tablet Take by mouth. 05/30/22   [provider]  phenazopyridine (PYRIDIUM) 95 MG tablet Take 1 tablet (95 mg total) by mouth 3 (three) times daily as needed for pain. 02/20/23   Selmer Dominion, NP  predniSONE (DELTASONE) 50 MG tablet Give 13 hours, 7 hours, and 1 hour prior to contrast dye injection. Notify pharmacy to schedule dose as appropriate based on procedure date/time. 10/29/23   Doree Albee, PA-C  rOPINIRole (REQUIP) 2 MG tablet Take 1 tablet (2 mg total) by mouth at bedtime. 11/09/23   Crain, Whitney L, PA  Semaglutide,0.25 or 0.5MG /DOS, (OZEMPIC, 0.25 OR 0.5 MG/DOSE,) 2 MG/1.5ML SOPN Inject 0.5 mg into the skin once a week.    [provider]  sucralfate (CARAFATE) 1 g tablet Take 1 tablet (1 g total) by mouth 4 (four) times daily -  with meals and at bedtime. 11/06/23   Doree Albee, PA-C  SYMBICORT 160-4.5 MCG/ACT inhaler as needed. 07/04/22   [provider]  traMADol (ULTRAM) 50 MG tablet Take 50 mg by mouth 3 (three) times daily as needed. 10/22/23   [provider]  Vitamin E 670 MG (1000 UT) CAPS Take by mouth.    [provider]      Allergies    Detrol [tolterodine], Gabapentin, Aspirin, Contrast media [iodinated contrast media], Imitrex [sumatriptan], Oxycodone, Milk-related compounds, Rosuvastatin, Vicodin hp [hydrocodone-acetaminophen], and Avelox [moxifloxacin]    Review of Systems   Review of Systems  Gastrointestinal:  Positive for abdominal pain.    Physical Exam Updated Vital Signs BP (!) 142/67   Pulse 70   Temp 98.6 F (37 C) (Oral)   Resp 15   Wt 117 kg   SpO2 99%   BMI 44.29 kg/m  Physical Exam Vitals and nursing note reviewed.  Constitutional:      General: She is not in acute distress.    Appearance: She is well-developed.  HENT:     Head: Normocephalic and atraumatic.  Eyes:     General:        Right eye: No discharge.        Left eye: No discharge.     Extraocular Movements: Extraocular movements intact.     Conjunctiva/sclera: Conjunctivae normal.     Right eye: Right conjunctiva is not injected. No chemosis, exudate or hemorrhage.    Left eye: Left conjunctiva is not  injected. No chemosis, exudate or hemorrhage.    Pupils: Pupils are equal, round, and reactive to light.     Comments: Keeps left eye closed during majority of exam  Some sporadic increased uptake on fluorescein  Unable to obtain IVCs with 2 different Tono-Pen's  Cardiovascular:     Rate and Rhythm: Normal rate and regular rhythm.     Heart sounds: No murmur heard. Pulmonary:     Effort: Pulmonary effort is normal. No respiratory distress.     Breath sounds: Normal breath sounds.  Abdominal:     Palpations: Abdomen is soft.     Tenderness: There is no abdominal tenderness.     Comments: Normal active bowel sounds.  Periumbilical and epigastric tenderness.  No rebound or  guarding.  No peritoneal signs.  Musculoskeletal:        General: No swelling.     Cervical back: Neck supple.  Skin:    General: Skin is warm and dry.     Capillary Refill: Capillary refill takes less than 2 seconds.  Neurological:     Mental Status: She is alert.  Psychiatric:        Mood and Affect: Mood normal.     ED Results / Procedures / Treatments   Labs (all labs ordered are listed, but only abnormal results are displayed) Labs Reviewed  LIPASE, BLOOD  COMPREHENSIVE METABOLIC PANEL  CBC  URINALYSIS, ROUTINE W REFLEX MICROSCOPIC    EKG EKG Interpretation Date/Time:  Monday November 10 2023 12:31:18 EST Ventricular Rate:  73 PR Interval:  156 QRS Duration:  76 QT Interval:  376 QTC Calculation: 414 R Axis:   9  Text Interpretation: Sinus rhythm with occasional Premature ventricular complexes Nonspecific T wave abnormality Abnormal ECG When compared with ECG of 16-Jan-2023 13:08, Premature ventricular complexes are now Present Confirmed by Estanislado Pandy [id number]) on 11/10/2023 2:13:18 PM  Radiology No results found.  Procedures Procedures    Medications Ordered in ED Medications  fluorescein ophthalmic strip 1 strip (1 strip Left Eye Given by Other 11/10/23 1450)  tetracaine (PONTOCAINE)  0.5 % ophthalmic solution 2 drop (2 drops Left Eye Given by Other 11/10/23 1450)  ondansetron (ZOFRAN) injection 4 mg (4 mg Intravenous Given 11/10/23 1433)  alum & mag hydroxide-simeth (MAALOX/MYLANTA) 200-200-20 MG/5ML suspension 30 mL (30 mLs Oral Given 11/10/23 1433)  dicyclomine (BENTYL) capsule 10 mg (10 mg Oral Given 11/10/23 1422)  barium (READI-CAT 2) 2 % suspension 450 mL (1 mL Oral Given 11/10/23 1636)    ED Course/ Medical Decision Making/ A&P Clinical Course as of 11/10/23 1705  Mon Nov 10, 2023  1703 CT ABDOMEN PELVIS WO CONTRAST [JT]    Clinical Course User Index [JT] Halford Decamp, PA-C                                 Medical Decision Making Amount and/or Complexity of Data Reviewed Labs: ordered.   This patient presents to the ED with chief complaint(s) of Abdominal pain, diarrhea and left eye irritation/ new floaters .  The complaint involves an extensive differential diagnosis and also carries with it a high risk of complications and morbidity.   pertinent past medical history as listed in HPI  The differential diagnosis includes  Vitreous detachment, retinal detachment, conjunctivitis, corneal abrasion, CVA, TIA gastroenteritis, pancreatitis, PUD,  The initial plan is to  Will obtain basic labs, abdominal imaging pending labs.  Will evaluate eye with visual acuity, fluorescein and IOC.  Will consider Optho consult Additional history obtained: No additional historians Records reviewed previous admission documents  Initial Assessment:   Patient mildly hypertensive with systolics to the 160s.  Visual acuity is noted to be 20/50 bilaterally.  Independent ECG interpretation:  Sinus rhythm with occasional PVCs  Independent labs interpretation:  The following labs were independently interpreted:  Lab work entirely unremarkable  Independent visualization and interpretation of imaging: I independently visualized the following imaging with scope of interpretation  limited to determining acute life threatening conditions related to emergency care: CT abdomen pelvis, which revealed fatty infiltration of the liver.  Treatment and Reassessment: Patient given GI cocktail and Zofran upon first assessment  Upon reassessment approximately 3:45 PM patient continues to  endorse epigastric/ periumbilical tenderness.  Discussed unremarkable workup thus far.  Using shared decision making we will proceed with CT abdomen pelvis.  Consultations obtained:   Ophthalmology: Received return call at 3:30 PM from Dr. Laverna Peace, concern for vitreous detachment.  Will see patient tomorrow in her office at 8 AM.   6:45 PM p.o. challenge successful  6:55 PM still complains of some periumbilical discomfort, but feels that she is ready to go home.  Disposition:   Patient will be discharged home, with follow-up with ophthalmology tomorrow morning at 8 AM. The patient has been appropriately medically screened and/or stabilized in the ED. I have low suspicion for any other emergent medical condition which would require further screening, evaluation or treatment in the ED or require inpatient management. At time of discharge the patient is hemodynamically stable and in no acute distress. I have discussed work-up results and diagnosis with patient and answered all questions. Patient is agreeable with discharge plan. We discussed strict return precautions for returning to the emergency department and they verbalized understanding.     Social Determinants of Health:   none  This note was dictated with voice recognition software.  Despite best efforts at proofreading, errors may have occurred which can change the documentation meaning.          Final Clinical Impression(s) / ED Diagnoses Final diagnoses:  Eye discomfort, left  Epigastric pain    Rx / DC Orders ED Discharge Orders     None         Halford Decamp, PA-C 11/10/23 2004    Young, Travis J,  [place] 11/11/23 [id number]

## 2023-11-10 NOTE — ED Triage Notes (Signed)
Pt c/o LT eye swelling and pain, reports as "spider like legs" floaters in eye x 3 days pta. Endorses "watering". Tx at [facility] for same. Denies injury. Also reports "stomachache" and indicates epigastric pain x 1 week with diarrhea

## 2023-11-11 ENCOUNTER — Other Ambulatory Visit: Payer: Self-pay | Admitting: Internal Medicine

## 2023-11-11 DIAGNOSIS — H43812 Vitreous degeneration, left eye: Secondary | ICD-10-CM | POA: Diagnosis not present

## 2023-11-11 DIAGNOSIS — E7849 Other hyperlipidemia: Secondary | ICD-10-CM

## 2023-11-17 ENCOUNTER — Ambulatory Visit (INDEPENDENT_AMBULATORY_CARE_PROVIDER_SITE_OTHER)
Admission: RE | Admit: 2023-11-17 | Discharge: 2023-11-17 | Disposition: A | Discharge status: Home / Self Care | Payer: Medicare PPO | Source: Ambulatory Visit | Attending: Physician Assistant | Admitting: Physician Assistant

## 2023-11-17 ENCOUNTER — Telehealth: Payer: Self-pay | Admitting: Physician Assistant

## 2023-11-17 ENCOUNTER — Encounter: Payer: Self-pay | Admitting: Obstetrics and Gynecology

## 2023-11-17 ENCOUNTER — Ambulatory Visit (INDEPENDENT_AMBULATORY_CARE_PROVIDER_SITE_OTHER): Payer: Medicare PPO | Admitting: Obstetrics and Gynecology

## 2023-11-17 VITALS — BP 134/73 | HR 73

## 2023-11-17 DIAGNOSIS — N3281 Overactive bladder: Secondary | ICD-10-CM

## 2023-11-17 DIAGNOSIS — R152 Fecal urgency: Secondary | ICD-10-CM | POA: Diagnosis not present

## 2023-11-17 DIAGNOSIS — A09 Infectious gastroenteritis and colitis, unspecified: Secondary | ICD-10-CM

## 2023-11-17 DIAGNOSIS — Z9049 Acquired absence of other specified parts of digestive tract: Secondary | ICD-10-CM | POA: Diagnosis not present

## 2023-11-17 DIAGNOSIS — N393 Stress incontinence (female) (male): Secondary | ICD-10-CM | POA: Diagnosis not present

## 2023-11-17 DIAGNOSIS — I878 Other specified disorders of veins: Secondary | ICD-10-CM | POA: Diagnosis not present

## 2023-11-17 MED ORDER — VIBEGRON 75 MG PO TABS: 75.0000 mg | ORAL_TABLET | Freq: Every day | ORAL | 5 refills | Status: DC

## 2023-11-17 NOTE — Telephone Encounter (Signed)
Negative GI pathogen panel. No infection seen in your stool.  Sent patient MyChart message.

## 2023-11-17 NOTE — Progress Notes (Signed)
[facility]   Subjective:     Chief Complaint: Pessary Check Paula [name] is a 61 y.o. female is here for pessary check.)  History of Present Illness: Paula [name] is a 62 y.o. female with stress incontinence and OAB who presents today for a pessary fitting. Patient endorsees that she also has significant OAB and urgency with her bowels. She reports she drinks water at a restaurant and has to go 2-3 times in an hour long setting and that she cannot tolerate longer than an hour or so overnight. She reports frequent loss of urine when she wakes up and attempts to make it to the bathroom.    Past Medical History: Patient  has a past medical history of Anxiety, Chronic lower back pain, Conversion disorder, Depression, Diverticulitis, Expressive aphasia (11/15/2016), Fibromyalgia, GERD (gastroesophageal reflux disease), Heart murmur, High cholesterol, History of gastric ulcer, History of hiatal hernia, History of kidney stones, History of thyroid nodule, Hyperlipidemia, Hypertension, IBS (irritable bowel syndrome), Intermittent vertigo, Migraine, Obesity, OSA on CPAP, Osteoarthritis, Pneumonia (X 1), Psychogenic tremor, Refusal of blood transfusions as patient is Jehovah's Witness, RLS (restless legs syndrome), Sleep apnea, Urinary incontinence, and Vertigo.   Past Surgical History: She  has a past surgical history that includes Cesarean section (1977; 1982); Anterior cervical decomp/discectomy fusion (02/10/2004); Umbilical hernia repair (age 62); Cardiovascular stress test (2017); Cystoscopy with ureteroscopy and stent placement (Right, 06/07/2015); Holmium laser application (N/A, 06/07/2015); Cystoscopy w/ retrogrades (Right, 06/07/2015); Upper gi endoscopy; Colonoscopy; Back surgery; Laparoscopic cholecystectomy (10/07/2000); Hernia repair; Abdominal hysterectomy (1990); Lipoma excision (01/2017); and Direct laryngoscopy (N/A, 10/29/2021).   Medications: She has a current  medication list which includes the following prescription(s): accu-chek guide test, accu-chek softclix lancets, acetaminophen, albuterol, benzonatate, accu-chek guide, vitamin d-1000 max st, vitamin b 12, cyclobenzaprine, dexlansoprazole, dicyclomine, repatha sureclick, fluticasone, GI Cocktail (alum & mag hydroxide, lidocaine, dicyclomine) oral mixture, methylprednisolone, ondansetron, pantoprazole, phenazopyridine, prednisone, ropinirole, ozempic (0.25 or 0.5 mg/dose), sucralfate, symbicort, tramadol, vibegron, and vitamin e, and the following Facility-Administered Medications: sodium chloride.   Allergies: Patient is allergic to detrol [tolterodine], gabapentin, aspirin, contrast media [iodinated contrast media], imitrex [sumatriptan], oxycodone, milk-related compounds, rosuvastatin, vicodin hp [hydrocodone-acetaminophen], and avelox [moxifloxacin].   Social History: Patient  reports that she quit smoking about 15 years ago. Her smoking use included cigarettes. She started smoking about 40 years ago. She has a 12.5 pack-year smoking history. She has never used smokeless tobacco. She reports current alcohol use. She reports that she does not use drugs.      Objective:    BP 134/73   Pulse 73  Chaperone present for exam and pessary placement Gen: No apparent distress, A&O x 3. Pelvic Exam: Normal external female genitalia; Bartholin's and Skene's glands normal in appearance; urethral meatus normal in appearance, no urethral masses or discharge.   A size #3 incontinence dish pessary (Lot [id number]) was fitted. It was comfortable, stayed in place with valsalva and was an appropriate size on examination, with one finger fitting between the pessary and the vaginal walls. Patient was able to void without expulsion.     Assessment/Plan:    Assessment: Paula [name] is a 62 y.o. with stress incontinence and OAB who presents for a pessary fitting. Plan: She was fitted with a #3 incontinence dish  pessary. She will keep the pessary in place until next visit. She will use lubricant.   Follow-up in 4 weeks for a pessary check or sooner as needed.   For patient's OAB we started  Gemtesa today. She had a reaction with Myrbetriq and did not try other medications after that. We also discussed that for refractory cases posterior tibial nerve stimulation, sacral neuromodulation, and intravesical botulinum toxin injection. Handouts given to patient on Medtronic SNM device and Bladder botox injection.   Patient also reports she may want to undergo sling for SUI.   As patient is interested in procedural options and possible surgical sling, will have her follow up with Dr. Florian [name] in 4 weeks for pessary check, med follow up and further planning of care.    All questions were answered.    Selmer Dominion, NP

## 2023-11-18 DIAGNOSIS — R109 Unspecified abdominal pain: Secondary | ICD-10-CM | POA: Diagnosis not present

## 2023-11-18 DIAGNOSIS — Z6841 Body Mass Index (BMI) 40.0 and over, adult: Secondary | ICD-10-CM | POA: Diagnosis not present

## 2023-11-18 DIAGNOSIS — H43392 Other vitreous opacities, left eye: Secondary | ICD-10-CM | POA: Diagnosis not present

## 2023-11-18 DIAGNOSIS — I700 Atherosclerosis of aorta: Secondary | ICD-10-CM | POA: Diagnosis not present

## 2023-12-02 ENCOUNTER — Telehealth: Payer: Self-pay | Admitting: Physician Assistant

## 2023-12-02 NOTE — Telephone Encounter (Signed)
Detailed message regarding results and recommendations per Dr. Marina Goodell left on pts voicemail.

## 2023-12-02 NOTE — Telephone Encounter (Signed)
 Pt calling for results of xray: Ordered by Alan Coombs PA  FINDINGS: Air and stool-filled nondilated loops of bowel. Moderate colonic stool burden predominately in the RIGHT and transverse colon. Visualized lung bases are unremarkable. Status post cholecystectomy. Radiopaque densities projecting over the LEFT lower quadrant likely reflecting retained contrast within diverticuli. Pelvic phleboliths. Pessary.   IMPRESSION: 1. Nonobstructive bowel gas pattern. 2. Moderate colonic stool burden.

## 2023-12-02 NOTE — Telephone Encounter (Signed)
Inbound call from patient requesting a call regarding recent imaging results. Please advise, thank you.

## 2023-12-02 NOTE — Telephone Encounter (Signed)
1.  No acute findings on x-ray 2.  Suggests constipation 3.  If constipated use MiraLAX.  If using MiraLAX, use more. Happy new year

## 2023-12-08 ENCOUNTER — Encounter: Payer: Self-pay | Admitting: Physician Assistant

## 2024-01-02 ENCOUNTER — Ambulatory Visit (INDEPENDENT_AMBULATORY_CARE_PROVIDER_SITE_OTHER): Payer: Medicare PPO | Admitting: Obstetrics and Gynecology

## 2024-01-02 ENCOUNTER — Encounter: Payer: Self-pay | Admitting: Obstetrics and Gynecology

## 2024-01-02 VITALS — BP 161/84 | HR 87

## 2024-01-02 DIAGNOSIS — N3281 Overactive bladder: Secondary | ICD-10-CM | POA: Diagnosis not present

## 2024-01-02 DIAGNOSIS — N393 Stress incontinence (female) (male): Secondary | ICD-10-CM | POA: Insufficient documentation

## 2024-01-02 MED ORDER — VIBEGRON 75 MG PO TABS: 75.0000 mg | ORAL_TABLET | Freq: Every day | ORAL | 5 refills | Status: DC

## 2024-01-02 NOTE — Progress Notes (Signed)
[facility]   Subjective:     Chief Complaint: Urinary Incontinence  History of Present Illness: RUBBY BARBARY is a 63 y.o. female with stress incontinence and OAB.   Had a incontinence ring pessary placed last visit. No issues. It is working well./ Does not have much leakage with cough or sneeze.   Has tried myrbetriq and only noticed 10% improvement. Gemtesa prescription was not received after last visit- pt stated she had called [facility] and there was nothing there.     Past Medical History: Patient  has a past medical history of Anxiety, Chronic lower back pain, Conversion disorder, Depression, Diverticulitis, Expressive aphasia (11/15/2016), Fibromyalgia, GERD (gastroesophageal reflux disease), Heart murmur, High cholesterol, History of gastric ulcer, History of hiatal hernia, History of kidney stones, History of thyroid nodule, Hyperlipidemia, Hypertension, IBS (irritable bowel syndrome), Intermittent vertigo, Migraine, Obesity, OSA on CPAP, Osteoarthritis, Pneumonia (X 1), Psychogenic tremor, Refusal of blood transfusions as patient is Jehovah's Witness, RLS (restless legs syndrome), Sleep apnea, Urinary incontinence, and Vertigo.   Past Surgical History: She  has a past surgical history that includes Cesarean section (1977; 1982); Anterior cervical decomp/discectomy fusion (02/10/2004); Umbilical hernia repair (age 60); Cardiovascular stress test (2017); Cystoscopy with ureteroscopy and stent placement (Right, 06/07/2015); Holmium laser application (N/A, 06/07/2015); Cystoscopy w/ retrogrades (Right, 06/07/2015); Upper gi endoscopy; Colonoscopy; Back surgery; Laparoscopic cholecystectomy (10/07/2000); Hernia repair; Abdominal hysterectomy (1990); Lipoma excision (01/2017); and Direct laryngoscopy (N/A, 10/29/2021).   Medications: She has a current medication list which includes the following prescription(s): accu-chek guide test, accu-chek softclix lancets,  acetaminophen, albuterol, benzonatate, accu-chek guide, vitamin d-1000 max st, vitamin b 12, cyclobenzaprine, dexlansoprazole, dicyclomine, repatha sureclick, fluticasone, GI Cocktail (alum & mag hydroxide, lidocaine, dicyclomine) oral mixture, methylprednisolone, ondansetron, pantoprazole, phenazopyridine, prednisone, ropinirole, ozempic (0.25 or 0.5 mg/dose), sucralfate, symbicort, tramadol, vitamin e, and vibegron, and the following Facility-Administered Medications: sodium chloride.   Allergies: Patient is allergic to detrol [tolterodine], gabapentin, aspirin, contrast media [iodinated contrast media], imitrex [sumatriptan], oxycodone, milk-related compounds, rosuvastatin, vicodin hp [hydrocodone-acetaminophen], and avelox [moxifloxacin].   Social History: Patient  reports that she quit smoking about 15 years ago. Her smoking use included cigarettes. She started smoking about 40 years ago. She has a 12.5 pack-year smoking history. She has never used smokeless tobacco. She reports current alcohol use. She reports that she does not use drugs.      Objective:    BP (!) 161/84   Pulse 87   Gen: No apparent distress, A&O x 3. Pelvic Exam: deferred   Assessment/Plan:    Assessment: Ms. Coudriet is a 63 y.o. with stress incontinence and OAB   Plan: OAB (overactive bladder) Assessment & Plan: - For refractory OAB we reviewed the procedure for intravesical Botox injection with cystoscopy in the OR. We discussed possible need for self-catheterization and need for repeat therapy.  We discussed that there is a 5-15% chance of needing to catheterize with Botox and that this usually resolves in a few months; however can persist for longer periods of time.  Typically Botox injections would need to be repeated every 3-12 months since this is not a permanent therapy.  -We discussed the role of sacral neuromodulation and how it works. It requires a test phase, and documentation of bladder function via  diary. After a successful test period, a permanent wire and generator are placed in the OR. The goal of this therapy is at least a 50% improvement in symptoms. It is NOT realistic to expect a 100% cure.  We reviewed the fact that about 30% of patients fail the test phase and are not candidates for permanent generator placement.   - We also discussed the role of percutaneous tibial nerve stimulation and how it works.  She understands it requires 12 weekly visits for temporary neuromodulation of the sacral nerve roots via the tibial nerve and that she may then require continued tapered treatment.   - She is not sure she wants to proceed with any of the above therapies. She would prefer to try another medication. Will re-send the [facility] again and asked patient to let us know if there is an issue with the pharmacy and we can call and check on the Rx.    Orders: -     Vibegron; Take 1 tablet (75 mg total) by mouth daily.  Dispense: 30 tablet; Refill: 5  SUI (stress urinary incontinence, female) Assessment & Plan: - well controlled with incontinence ring pessary. She will return q3 months for cleanings (next March)    Return march for pessary cleaning and med check    Marguerita Beards, MD

## 2024-01-02 NOTE — Assessment & Plan Note (Signed)
-   well controlled with incontinence ring pessary. She will return q3 months for cleanings (next March)

## 2024-01-02 NOTE — Assessment & Plan Note (Signed)
-   For refractory OAB we reviewed the procedure for intravesical Botox injection with cystoscopy in the OR. We discussed possible need for self-catheterization and need for repeat therapy.  We discussed that there is a 5-15% chance of needing to catheterize with Botox and that this usually resolves in a few months; however can persist for longer periods of time.  Typically Botox injections would need to be repeated every 3-12 months since this is not a permanent therapy.  -We discussed the role of sacral neuromodulation and how it works. It requires a test phase, and documentation of bladder function via diary. After a successful test period, a permanent wire and generator are placed in the OR. The goal of this therapy is at least a 50% improvement in symptoms. It is NOT realistic to expect a 100% cure.  We reviewed the fact that about 30% of patients fail the test phase and are not candidates for permanent generator placement.   - We also discussed the role of percutaneous tibial nerve stimulation and how it works.  She understands it requires 12 weekly visits for temporary neuromodulation of the sacral nerve roots via the tibial nerve and that she may then require continued tapered treatment.   - She is not sure she wants to proceed with any of the above therapies. She would prefer to try another medication. Will re-send the [facility] again and asked patient to let us know if there is an issue with the pharmacy and we can call and check on the Rx.

## 2024-01-06 DIAGNOSIS — M1712 Unilateral primary osteoarthritis, left knee: Secondary | ICD-10-CM | POA: Diagnosis not present

## 2024-01-06 DIAGNOSIS — M25562 Pain in left knee: Secondary | ICD-10-CM | POA: Diagnosis not present

## 2024-01-16 DIAGNOSIS — H2511 Age-related nuclear cataract, right eye: Secondary | ICD-10-CM | POA: Diagnosis not present

## 2024-01-16 DIAGNOSIS — Z961 Presence of intraocular lens: Secondary | ICD-10-CM | POA: Diagnosis not present

## 2024-01-16 DIAGNOSIS — H26492 Other secondary cataract, left eye: Secondary | ICD-10-CM | POA: Diagnosis not present

## 2024-01-16 DIAGNOSIS — H43812 Vitreous degeneration, left eye: Secondary | ICD-10-CM | POA: Diagnosis not present

## 2024-01-20 DIAGNOSIS — K582 Mixed irritable bowel syndrome: Secondary | ICD-10-CM | POA: Diagnosis not present

## 2024-01-20 DIAGNOSIS — R1013 Epigastric pain: Secondary | ICD-10-CM | POA: Diagnosis not present

## 2024-01-20 DIAGNOSIS — I10 Essential (primary) hypertension: Secondary | ICD-10-CM | POA: Diagnosis not present

## 2024-01-20 DIAGNOSIS — K219 Gastro-esophageal reflux disease without esophagitis: Secondary | ICD-10-CM | POA: Diagnosis not present

## 2024-01-20 DIAGNOSIS — Z6841 Body Mass Index (BMI) 40.0 and over, adult: Secondary | ICD-10-CM | POA: Diagnosis not present

## 2024-01-20 DIAGNOSIS — R140 Abdominal distension (gaseous): Secondary | ICD-10-CM | POA: Diagnosis not present

## 2024-01-28 DIAGNOSIS — R7309 Other abnormal glucose: Secondary | ICD-10-CM | POA: Diagnosis not present

## 2024-01-28 DIAGNOSIS — N951 Menopausal and female climacteric states: Secondary | ICD-10-CM | POA: Diagnosis not present

## 2024-01-28 DIAGNOSIS — E782 Mixed hyperlipidemia: Secondary | ICD-10-CM | POA: Diagnosis not present

## 2024-01-28 DIAGNOSIS — E559 Vitamin D deficiency, unspecified: Secondary | ICD-10-CM | POA: Diagnosis not present

## 2024-01-28 DIAGNOSIS — R7989 Other specified abnormal findings of blood chemistry: Secondary | ICD-10-CM | POA: Diagnosis not present

## 2024-02-05 DIAGNOSIS — Z6841 Body Mass Index (BMI) 40.0 and over, adult: Secondary | ICD-10-CM | POA: Diagnosis not present

## 2024-02-05 DIAGNOSIS — F419 Anxiety disorder, unspecified: Secondary | ICD-10-CM | POA: Diagnosis not present

## 2024-02-10 DIAGNOSIS — M7062 Trochanteric bursitis, left hip: Secondary | ICD-10-CM | POA: Diagnosis not present

## 2024-02-10 DIAGNOSIS — S83419A Sprain of medial collateral ligament of unspecified knee, initial encounter: Secondary | ICD-10-CM | POA: Diagnosis not present

## 2024-02-10 DIAGNOSIS — J029 Acute pharyngitis, unspecified: Secondary | ICD-10-CM | POA: Diagnosis not present

## 2024-02-16 ENCOUNTER — Ambulatory Visit: Payer: Medicare PPO | Admitting: Obstetrics and Gynecology

## 2024-02-18 DIAGNOSIS — M25562 Pain in left knee: Secondary | ICD-10-CM | POA: Diagnosis not present

## 2024-02-19 DIAGNOSIS — Z6841 Body Mass Index (BMI) 40.0 and over, adult: Secondary | ICD-10-CM | POA: Diagnosis not present

## 2024-02-19 DIAGNOSIS — I10 Essential (primary) hypertension: Secondary | ICD-10-CM | POA: Diagnosis not present

## 2024-03-03 ENCOUNTER — Telehealth: Payer: Self-pay

## 2024-03-03 ENCOUNTER — Encounter: Payer: Self-pay | Admitting: Obstetrics and Gynecology

## 2024-03-03 ENCOUNTER — Ambulatory Visit (INDEPENDENT_AMBULATORY_CARE_PROVIDER_SITE_OTHER): Admitting: Obstetrics and Gynecology

## 2024-03-03 DIAGNOSIS — N3281 Overactive bladder: Secondary | ICD-10-CM

## 2024-03-03 DIAGNOSIS — N393 Stress incontinence (female) (male): Secondary | ICD-10-CM

## 2024-03-03 MED ORDER — VIBEGRON 75 MG PO TABS: 75.0000 mg | ORAL_TABLET | Freq: Every day | ORAL | 5 refills | Status: DC

## 2024-03-03 NOTE — Progress Notes (Signed)
  Urogynecology   Subjective:     Chief Complaint:  Chief Complaint  Patient presents with   Follow-up    Paula [name] is a 63 y.o. female here for pessary check.   History of Present Illness: Paula [name] is a 63 y.o. female with stress incontinence who presents for a pessary check. She is using a size #3 incontinence dish pessary. The pessary has been working well and she has no complaints. She is not using vaginal estrogen. She denies vaginal bleeding.  Past Medical History: Patient  has a past medical history of Anxiety, Chronic lower back pain, Conversion disorder, Depression, Diverticulitis, Expressive aphasia (11/15/2016), Fibromyalgia, GERD (gastroesophageal reflux disease), Heart murmur, High cholesterol, History of gastric ulcer, History of hiatal hernia, History of kidney stones, History of thyroid nodule, Hyperlipidemia, Hypertension, IBS (irritable bowel syndrome), Intermittent vertigo, Migraine, Obesity, OSA on CPAP, Osteoarthritis, Pneumonia (X 1), Psychogenic tremor, Refusal of blood transfusions as patient is Jehovah's Witness, RLS (restless legs syndrome), Sleep apnea, Urinary incontinence, and Vertigo.   Past Surgical History: She  has a past surgical history that includes Cesarean section (1977; 1982); Anterior cervical decomp/discectomy fusion (02/10/2004); Umbilical hernia repair (age 89); Cardiovascular stress test (2017); Cystoscopy with ureteroscopy and stent placement (Right, 06/07/2015); Holmium laser application (N/A, 06/07/2015); Cystoscopy w/ retrogrades (Right, 06/07/2015); Upper gi endoscopy; Colonoscopy; Back surgery; Laparoscopic cholecystectomy (10/07/2000); Hernia repair; Abdominal hysterectomy (1990); Lipoma excision (01/2017); and Direct laryngoscopy (N/A, 10/29/2021).   Medications: She has a current medication list which includes the following prescription(s): accu-chek guide test, accu-chek softclix lancets, acetaminophen, albuterol,  benzonatate, accu-chek guide, vitamin d-1000 max st, vitamin b 12, cyclobenzaprine, dexlansoprazole, dicyclomine, repatha sureclick, fluticasone, GI Cocktail (alum & mag hydroxide, lidocaine, dicyclomine) oral mixture, methylprednisolone, ondansetron, pantoprazole, phenazopyridine, prednisone, ropinirole, ozempic (0.25 or 0.5 mg/dose), sucralfate, symbicort, tramadol, vibegron, and vitamin e, and the following Facility-Administered Medications: sodium chloride.   Allergies: Patient is allergic to detrol [tolterodine], gabapentin, aspirin, contrast media [iodinated contrast media], imitrex [sumatriptan], oxycodone, milk-related compounds, rosuvastatin, vicodin hp [hydrocodone-acetaminophen], and avelox [moxifloxacin].   Social History: Patient  reports that she quit smoking about 15 years ago. Her smoking use included cigarettes. She started smoking about 40 years ago. She has a 12.5 pack-year smoking history. She has never used smokeless tobacco. She reports current alcohol use. She reports that she does not use drugs.      Objective:    Physical Exam: BP (!) 149/85   Pulse 76   Wt 261 lb (118.4 kg)   BMI 44.80 kg/m  Gen: No apparent distress, A&O x 3. Detailed Urogynecologic Evaluation:  Pelvic Exam: Normal external female genitalia; Bartholin's and Skene's glands normal in appearance; urethral meatus normal in appearance, no urethral masses or discharge. The pessary was noted to be in place. It was removed and cleaned. Speculum exam revealed no lesions in the vagina. The pessary was replaced. It was comfortable to the patient and fit well.    Assessment/Plan:    Assessment: Paula [name] is a 63 y.o. with stress incontinence here for a pessary check. She is doing well.  Plan: She will keep the pessary in place until next visit. She will continue to use lubricant. She will follow-up in 3 months for a pessary check or sooner as needed.   Patient is interested in moving forward with a  surgical sling as she is tired of the pessary. We discussed the risks and benefits of a sling as well as specific risks for her care. Patient does  not accept blood transfusions for religious regions. We also discussed for optimal sling placement we would encourage some weight loss for a BMI close to 40. We also discussed needing cardiac clearance. Patient reports she will do her part to lose weight. Will send cardiac clearance letter.   Patient to follow up either for Pre-op or pessary check depending on surgical clearance and weight loss goals.

## 2024-03-03 NOTE — Patient Instructions (Addendum)
 Goal would be to lose about 25lbs prior to sling surgery.   We will also send cardiac clearance  We will plan for surgical sling placement end of June early july

## 2024-03-03 NOTE — Telephone Encounter (Signed)
   Pre-operative Risk Assessment    Patient Name: KYESHIA ZINN  DOB: 03-02-1961 MRN: [id number]   Date of last office visit: 06/22/24 DR. HILTY Date of next office visit: NONE   Request for Surgical Clearance    Procedure:   SLING PLACEMENT FOR STRESS INCONTINENCE   Date of Surgery:  Clearance TBD  (SOMETIME JUNE OR JULY 2025)                              Surgeon:  DR. Lanetta Inch  Surgeon's Group or Practice Name:  [facility] Phone number:  [phone] Fax number:  [phone]   Type of Clearance Requested:   - Medical ; NONE INDICATED ON FORM TO BE HELD   Type of Anesthesia:  General    Additional requests/questions:    Elpidio Anis   03/03/2024, 5:13 PM

## 2024-03-04 ENCOUNTER — Telehealth: Payer: Self-pay

## 2024-03-04 DIAGNOSIS — R053 Chronic cough: Secondary | ICD-10-CM | POA: Diagnosis not present

## 2024-03-04 DIAGNOSIS — I471 Supraventricular tachycardia, unspecified: Secondary | ICD-10-CM | POA: Diagnosis not present

## 2024-03-04 DIAGNOSIS — R7303 Prediabetes: Secondary | ICD-10-CM | POA: Diagnosis not present

## 2024-03-04 DIAGNOSIS — F411 Generalized anxiety disorder: Secondary | ICD-10-CM | POA: Diagnosis not present

## 2024-03-04 DIAGNOSIS — E782 Mixed hyperlipidemia: Secondary | ICD-10-CM | POA: Diagnosis not present

## 2024-03-04 DIAGNOSIS — R251 Tremor, unspecified: Secondary | ICD-10-CM | POA: Diagnosis not present

## 2024-03-04 DIAGNOSIS — I10 Essential (primary) hypertension: Secondary | ICD-10-CM | POA: Diagnosis not present

## 2024-03-04 DIAGNOSIS — K588 Other irritable bowel syndrome: Secondary | ICD-10-CM | POA: Diagnosis not present

## 2024-03-04 DIAGNOSIS — K219 Gastro-esophageal reflux disease without esophagitis: Secondary | ICD-10-CM | POA: Diagnosis not present

## 2024-03-04 DIAGNOSIS — D72829 Elevated white blood cell count, unspecified: Secondary | ICD-10-CM | POA: Diagnosis not present

## 2024-03-04 NOTE — Telephone Encounter (Signed)
   Name: Paula [name]  DOB: 1961/05/07  MRN: [id number]  Primary Cardiologist: Thomasene Ripple, DO   Preoperative team, please contact this patient and set up a phone call appointment for further preoperative risk assessment. Please obtain consent and complete medication review. Thank you for your help.  I confirm that guidance regarding antiplatelet and oral anticoagulation therapy has been completed and, if necessary, noted below.  I also confirmed the patient resides in the state of [place]. As per [facility] Medical Board telemedicine laws, the patient must reside in the state in which the provider is licensed.   Marcelino Duster, PA 03/04/2024, 8:12 AM [facility]

## 2024-03-04 NOTE — Telephone Encounter (Signed)
 Called patient to schedule a Tele visit for a pre-op clearance on 04/23/2024 @10 :20 Meds rec and consent done.      Patient Consent for Virtual Visit        Paula [name] has provided verbal consent on 03/04/2024 for a virtual visit (video or telephone).   CONSENT FOR VIRTUAL VISIT FOR:  Paula [name]  By participating in this virtual visit I agree to the following:  I hereby voluntarily request, consent and authorize [facility] and its employed or contracted physicians, physician assistants, nurse practitioners or other licensed health care professionals (the Practitioner), to provide me with telemedicine health care services (the "Services") as deemed necessary by the treating Practitioner. I acknowledge and consent to receive the Services by the Practitioner via telemedicine. I understand that the telemedicine visit will involve communicating with the Practitioner through live audiovisual communication technology and the disclosure of certain medical information by electronic transmission. I acknowledge that I have been given the opportunity to request an in-person assessment or other available alternative prior to the telemedicine visit and am voluntarily participating in the telemedicine visit.  I understand that I have the right to withhold or withdraw my consent to the use of telemedicine in the course of my care at any time, without affecting my right to future care or treatment, and that the Practitioner or I may terminate the telemedicine visit at any time. I understand that I have the right to inspect all information obtained and/or recorded in the course of the telemedicine visit and may receive copies of available information for a reasonable fee.  I understand that some of the potential risks of receiving the Services via telemedicine include:  Delay or interruption in medical evaluation due to technological equipment failure or disruption; Information transmitted may  not be sufficient (e.g. poor resolution of images) to allow for appropriate medical decision making by the Practitioner; and/or  In rare instances, security protocols could fail, causing a breach of personal health information.  Furthermore, I acknowledge that it is my responsibility to provide information about my medical history, conditions and care that is complete and accurate to the best of my ability. I acknowledge that Practitioner's advice, recommendations, and/or decision may be based on factors not within their control, such as incomplete or inaccurate data provided by me or distortions of diagnostic images or specimens that may result from electronic transmissions. I understand that the practice of medicine is not an exact science and that Practitioner makes no warranties or guarantees regarding treatment outcomes. I acknowledge that a copy of this consent can be made available to me via my patient portal [facility] MyChart), or I can request a printed copy by calling the office of [facility].    I understand that my insurance will be billed for this visit.   I have read or had this consent read to me. I understand the contents of this consent, which adequately explains the benefits and risks of the Services being provided via telemedicine.  I have been provided ample opportunity to ask questions regarding this consent and the Services and have had my questions answered to my satisfaction. I give my informed consent for the services to be provided through the use of telemedicine in my medical care

## 2024-03-04 NOTE — Telephone Encounter (Signed)
 Called patient to schedule a Tele visit for a pre-op clearance on 04/23/2024 @10 :20 Meds rec and consent done.

## 2024-03-04 NOTE — Progress Notes (Signed)
 Patient's Cardiac clearance letter has been successfully faxed over to Dr. Rennis Golden.

## 2024-03-09 DIAGNOSIS — G4733 Obstructive sleep apnea (adult) (pediatric): Secondary | ICD-10-CM | POA: Diagnosis not present

## 2024-03-11 ENCOUNTER — Ambulatory Visit (HOSPITAL_BASED_OUTPATIENT_CLINIC_OR_DEPARTMENT_OTHER): Admitting: Pulmonary Disease

## 2024-03-11 ENCOUNTER — Encounter (HOSPITAL_BASED_OUTPATIENT_CLINIC_OR_DEPARTMENT_OTHER): Payer: Self-pay | Admitting: Pulmonary Disease

## 2024-03-11 VITALS — BP 124/82 | HR 64 | Ht 64.0 in | Wt 261.4 lb

## 2024-03-11 DIAGNOSIS — G4733 Obstructive sleep apnea (adult) (pediatric): Secondary | ICD-10-CM | POA: Diagnosis not present

## 2024-03-11 DIAGNOSIS — R053 Chronic cough: Secondary | ICD-10-CM | POA: Diagnosis not present

## 2024-03-11 MED ORDER — PROMETHAZINE-DM 6.25-15 MG/5ML PO SYRP: 5.0000 mL | ORAL_SOLUTION | Freq: Four times a day (QID) | ORAL | 1 refills | Status: AC | PRN

## 2024-03-11 NOTE — Addendum Note (Signed)
 Addended by: Amada Kingfisher on: 03/11/2024 02:58 PM   Modules accepted: Orders

## 2024-03-11 NOTE — Patient Instructions (Addendum)
 Chronic cough could be related to both reflux and postnasal drip  Continue on Dexilant  X start Chlor-Trimeton 4 mg at bedtime for 2 weeks  Okay to take Tussin DM or Delsym cough syrup  X change to auto CPAP 10 to 15 cm

## 2024-03-11 NOTE — Progress Notes (Signed)
 Subjective:    Patient ID: Paula [name], female    DOB: [birth year], 63 y.o.   MRN: [id number]  HPI  63 yo woman for follow-up of OSA & chronic cough attributed to postnasal drip versus GERD  -persistent oropharyngeal dysphagia    PMH: Thyroid nodule, anxiety, obesity, HTN, GERD, DM, TIA, Dyslipidemia, Fibromyalgia - Former Smoker. Quit 2009. 12.5 pack year   The patient, with a history of sleep apnea and chronic cough, presents for a one-year follow-up. She reports a persistent cough that has been present for about two months. The cough is described as dry and irritating. She also reports a previous episode of a sore throat and a fever of 101 degrees, which prompted a negative COVID-19 test. The patient has been managing her symptoms with rest, juices, and over-the-counter medications.  The patient also reports weight gain, which she attributes to decreased activity due to knee issues, including a tear, bursitis, a bone spur, sciatic nerve issues, and neuropathy. She is awaiting water therapy for these issues but reports a delay in scheduling.  The patient has been using a CPAP machine for her sleep apnea and reports good compliance, not missing a single night. She reports that the CPAP is working great and she has received new supplies.  The patient also reports a history of reflux and has been taking Dexilant once a day and dicyclomine twice a day. She reports that the dicyclomine helps her sleep better. She is also seeing a specialist for possible excessive bacteria in the stomach.  The patient has recently made dietary changes, including giving up pork and incorporating more chicken, vegetables, fruits, and juices into her diet. She also recently completed a two-day fast   CPAP download shows excellent control of events on auto settings 4 to 20 cm with average pressure 12 and max pressure of 14 cm.  She is very compliant without a single missed night, more than 7.5 hours per  night  Significant tests/ events reviewed   PSG 2002 >> CPAP 9cm with a nasal mask PSG 01/2014  - AHI 8/h, RDI 2/h , lowest 79% Placed on autoCPAP with pillows ([facility]) >> avg pr 11 cm      PFTs 11/2015  FEV1  at 99%, ratio 92, no significant bronchodilator response, FVC 85%. DLCO 82%. Spirometry 04/2018  moderate restriction with FEV1 at 71%, ratio 89, FVC 63%.   UGI series 10/2015 >>Esophageal dysmotility with distal barium stasis, likely secondary to reflux. Reflux to the lower esophagus was documented   CT chest 04/2018 showed clear lungs negative for PE.  FENO 9 ppb .   Review of Systems neg for any significant sore throat, dysphagia, itching, sneezing, nasal congestion or excess/ purulent secretions, fever, chills, sweats, unintended wt loss, pleuritic or exertional cp, hempoptysis, orthopnea pnd or change in chronic leg swelling. Also denies presyncope, palpitations, heartburn, abdominal pain, nausea, vomiting, diarrhea or change in bowel or urinary habits, dysuria,hematuria, rash, arthralgias, visual complaints, headache, numbness weakness or ataxia.     Objective:   Physical Exam  Gen. Pleasant, obese, in no distress ENT - no lesions, no post nasal drip Neck: No JVD, no thyromegaly, no carotid bruits Lungs: no use of accessory muscles, no dullness to percussion, decreased without rales or rhonchi  Cardiovascular: Rhythm regular, heart sounds  normal, no murmurs or gallops, no peripheral edema Musculoskeletal: No deformities, no cyanosis or clubbing , no tremors       Assessment & Plan:   Chronic cough Chronic  cough for two months, likely due to reflux and postnasal drip from allergies. Dexilant has been effective for reflux-related cough. She reports a dry, irritating cough with a sore throat, but no itchiness. Recent COVID test was negative. Increased dicyclomine use may have contributed to cough persistence. Plan addresses reflux and allergies as potential causes. -  Continue Dexilant for reflux management. - Initiate chlortrimeton for postnasal drip due to allergies, to be taken at night due to potential drowsiness. - Prescribe non-narcotic cough syrup for symptomatic relief.  Gastroesophageal reflux disease (GERD) GERD managed with Dexilant, though increased dicyclomine use is not indicated for reflux. She is consulting a specialist for potential bacterial overgrowth. Dietary changes include eliminating pork, focusing on chicken, vegetables, fruits, and juices. - Continue Dexilant for GERD management. - Follow up with gastroenterologist for further evaluation of potential bacterial overgrowth.  Obstructive sleep apnea Obstructive sleep apnea is well-managed with CPAP therapy. She is compliant with CPAP use, reporting no missed nights and effective pressure control with no mask leaks. She reports sleeping seven and a half hours with the CPAP.  - She is very compliant with CPAP presently and this has improved somnolence and fatigue

## 2024-03-18 DIAGNOSIS — R195 Other fecal abnormalities: Secondary | ICD-10-CM | POA: Diagnosis not present

## 2024-03-18 DIAGNOSIS — R1084 Generalized abdominal pain: Secondary | ICD-10-CM | POA: Diagnosis not present

## 2024-03-18 DIAGNOSIS — L249 Irritant contact dermatitis, unspecified cause: Secondary | ICD-10-CM | POA: Diagnosis not present

## 2024-03-18 DIAGNOSIS — L821 Other seborrheic keratosis: Secondary | ICD-10-CM | POA: Diagnosis not present

## 2024-03-18 DIAGNOSIS — R197 Diarrhea, unspecified: Secondary | ICD-10-CM | POA: Diagnosis not present

## 2024-03-18 DIAGNOSIS — R12 Heartburn: Secondary | ICD-10-CM | POA: Diagnosis not present

## 2024-03-18 DIAGNOSIS — R140 Abdominal distension (gaseous): Secondary | ICD-10-CM | POA: Diagnosis not present

## 2024-03-24 DIAGNOSIS — R140 Abdominal distension (gaseous): Secondary | ICD-10-CM | POA: Diagnosis not present

## 2024-03-24 DIAGNOSIS — K582 Mixed irritable bowel syndrome: Secondary | ICD-10-CM | POA: Diagnosis not present

## 2024-03-24 DIAGNOSIS — R1013 Epigastric pain: Secondary | ICD-10-CM | POA: Diagnosis not present

## 2024-03-26 DIAGNOSIS — K582 Mixed irritable bowel syndrome: Secondary | ICD-10-CM | POA: Diagnosis not present

## 2024-03-26 DIAGNOSIS — R140 Abdominal distension (gaseous): Secondary | ICD-10-CM | POA: Diagnosis not present

## 2024-03-26 DIAGNOSIS — R1013 Epigastric pain: Secondary | ICD-10-CM | POA: Diagnosis not present

## 2024-03-31 ENCOUNTER — Telehealth (HOSPITAL_BASED_OUTPATIENT_CLINIC_OR_DEPARTMENT_OTHER): Payer: Self-pay | Admitting: Physical Therapy

## 2024-03-31 NOTE — Telephone Encounter (Signed)
 Called and LVM to remind patient of upcoming physical therapy evaluation appointment. Requested call back to confirm appointment attendance.

## 2024-04-01 ENCOUNTER — Ambulatory Visit (HOSPITAL_BASED_OUTPATIENT_CLINIC_OR_DEPARTMENT_OTHER): Attending: Sports Medicine | Admitting: Physical Therapy

## 2024-04-01 DIAGNOSIS — M6281 Muscle weakness (generalized): Secondary | ICD-10-CM | POA: Insufficient documentation

## 2024-04-01 DIAGNOSIS — G8929 Other chronic pain: Secondary | ICD-10-CM | POA: Diagnosis not present

## 2024-04-01 DIAGNOSIS — M25562 Pain in left knee: Secondary | ICD-10-CM | POA: Diagnosis not present

## 2024-04-01 DIAGNOSIS — R262 Difficulty in walking, not elsewhere classified: Secondary | ICD-10-CM | POA: Diagnosis not present

## 2024-04-01 NOTE — Therapy (Unsigned)
 OUTPATIENT PHYSICAL THERAPY LOWER EXTREMITY EVALUATION   Patient Name: Paula [name] MRN: [id number] DOB:[birth year], 63 y.o., female Today's Date: 04/04/2024  END OF SESSION:   PT End of Session -     Visit Number 1   Number of Visits    Date for PT Re-Evaluation 05/28/2024   Authorization Type [employer] mcr   PT Start Time  802   PT Stop Time 830   PT Time Calculation (min) 28 min   Activity Tolerance Pt limited due to conversion reaction   Behavior During Therapy  WFL for tasks assessed/performed      Past Medical History:  Diagnosis Date   Anxiety    pt stated anxiety episodes resemble stroke symptoms   Chronic lower back pain    Conversion disorder    Depression    Diverticulitis    Expressive aphasia 11/15/2016   Fibromyalgia    GERD (gastroesophageal reflux disease)    Heart murmur    High cholesterol    History of gastric ulcer    History of hiatal hernia    History of kidney stones    History of thyroid  nodule    Hyperlipidemia    Hypertension    IBS (irritable bowel syndrome)    Intermittent vertigo    Migraine    "a few times/year now; maybe" (11/15/2016)   Obesity    OSA on CPAP    wears CPAP nightly ;study in epic 02-01-2014 (11/15/2016)   Osteoarthritis    "knees, hands, back, hips" (11/15/2016)   Pneumonia X 1   hx of   Psychogenic tremor    intermittant head tremor-(11/15/2016)   Refusal of blood transfusions as patient is Jehovah's Witness    RLS (restless legs syndrome)    Sleep apnea    CPAP   Urinary incontinence    Vertigo    Past Surgical History:  Procedure Laterality Date   ABDOMINAL HYSTERECTOMY  1990   Partial, still has ovaries   ANTERIOR CERVICAL DECOMP/DISCECTOMY FUSION  02/10/2004   C4 -- C7   BACK SURGERY     CARDIOVASCULAR STRESS TEST  2017   Negative   CESAREAN SECTION  1977; 1982   COLONOSCOPY     CYSTOSCOPY W/ RETROGRADES Right 06/07/2015   Procedure: CYSTOSCOPY WITH RETROGRADE PYELOGRAM;  Surgeon: Andrez Banker, MD;  Location: [facility];  Service: Urology;  Laterality: Right;   CYSTOSCOPY WITH URETEROSCOPY AND STENT PLACEMENT Right 06/07/2015   Procedure: RIGHT URETEROSCOPY, LASER LITHOTRIPSY, STONE REMOVAL AND RIGHT URETERAL STENT PLACEMENT;  Surgeon: Andrez Banker, MD;  Location: [facility];  Service: Urology;  Laterality: Right;   DIRECT LARYNGOSCOPY N/A 10/29/2021   Procedure: DIRECT LARYNGOSCOPY WITH BIOPSY;  Surgeon: Virgina Grills, MD;  Location: [facility];  Service: ENT;  Laterality: N/A;   HERNIA REPAIR     HOLMIUM LASER APPLICATION N/A 06/07/2015   Procedure: HOLMIUM LASER APPLICATION;  Surgeon: Andrez Banker, MD;  Location: [facility];  Service: Urology;  Laterality: N/A;   LAPAROSCOPIC CHOLECYSTECTOMY  10/07/2000   LIPOMA EXCISION  01/2017   from neck   UMBILICAL HERNIA REPAIR  age 46   UPPER GI ENDOSCOPY     Patient Active Problem List   Diagnosis Date Noted   OAB (overactive bladder) 01/02/2024   SUI (stress urinary incontinence, female) 01/02/2024   Precordial pain 12/07/2021   Chest pain 12/07/2021   Prediabetes 12/07/2021   Preprocedural examination 12/07/2021   Hyperlipidemia 12/07/2021   Palpitations  12/07/2021   Physical deconditioning 08/22/2021   History of COVID-19 01/04/2021   Adult onset stuttering 06/30/2020   Migraine 06/30/2020   Fibromyalgia 06/30/2020   Type 2 diabetes mellitus without complication (HCC) 06/30/2020   TIA (transient ischemic attack) 06/30/2020   Atypical chest pain 08/13/2018   Depression, major 03/17/2018   Chronic pain syndrome 03/17/2018   Expressive aphasia 11/15/2016   Chronic cough 11/08/2015   B12 deficiency 12/07/2014   Wart viral 11/07/2014   Coarse tremors 02/09/2014   OSA on CPAP 01/28/2014   Functional neurological symptom disorder (conversion disorder), with abnormal movement 01/14/2014   Dyspnea 01/12/2014   Tremor of face and hands 01/12/2014   Breast pain in  female 02/09/2013   Elevated WBC count 10/12/2012   Abdominal pain, other specified site 10/09/2012   Abdominal bloating 10/09/2012   Ovarian cystic mass 05/25/2012   Shoulder pain, right 03/11/2012   Right arm pain 12/30/2011   Acute sinusitis 07/30/2010   ACNE, ROSACEA 07/17/2010   LEG PAIN 07/17/2010   NECK PAIN 05/10/2010   ABDOMINAL PAIN, EPIGASTRIC 05/10/2010   BURSITIS, LEFT HIP 05/01/2010   PLANTAR FASCIITIS, BILATERAL 05/01/2010   HYPERGLYCEMIA 01/05/2010   PRURITUS 11/27/2009   TOBACCO USE, QUIT 11/27/2009   Anxiety state 11/22/2008   FATIGUE 11/22/2008   Essential hypertension 10/07/2008   Insomnia, unspecified 08/30/2008   Obesity 08/19/2008   NAUSEA ALONE 05/02/2008   VERTIGO 01/19/2008   DIARRHEA 01/19/2008   THYROID  NODULE 06/23/2007   Dyslipidemia 06/23/2007   Situational depression 06/23/2007   ADD 06/23/2007   TMJ SYNDROME 06/23/2007   GERD 06/23/2007   Irritable bowel syndrome 06/23/2007   Headache 06/23/2007    PCP: Rickford Charnley MD  REFERRING PROVIDER:   Rance Burrows, MD    REFERRING DIAG: [id number] (ICD-10-CM) - Pain in left knee   THERAPY DIAG:  Chronic pain of left knee  Muscle weakness (generalized)  Difficulty in walking, not elsewhere classified  Rationale for Evaluation and Treatment: Rehabilitation  ONSET DATE: 1 year  SUBJECTIVE:   SUBJECTIVE STATEMENT:  Pt reports knee pain for the past year with increasing difficulty walking.  Also has chronic LBP.  She reports she has not been exercising in the past few months.  She is hoping to decrease her pain and walk better  PERTINENT HISTORY: Conversion disorder: looks like stroke more frequent. Triggers are eye movements, certain words. Have not been having many recently PAIN:  Are you having pain? Yes: NPRS scale: current 10/10 Pain location: l knee Pain description: sharp achy Aggravating factors: walking 6-7 minutes; standing Relieving factors: rest   PRECAUTIONS:  None  RED FLAGS: None   WEIGHT BEARING RESTRICTIONS: no  FALLS:  Has patient fallen in last 6 months? No  LIVING ENVIRONMENT: Lives with: lives with their family and lives with their spouse Lives in: House/apartment Stairs: Yes: External: 1 steps; none Has following equipment at home: None  OCCUPATION: retired  PLOF: Independent  PATIENT GOALS: more mobility, less pain  NEXT MD VISIT: as needed  OBJECTIVE:  Note: Objective measures were completed at Evaluation unless otherwise noted.  DIAGNOSTIC FINDINGS: none in chart  PATIENT SURVEYS:  LEFS 27/80  COGNITION: Overall cognitive status: Within functional limits for tasks assessed     SENSATION: WFL  EDEMA:  Mild edema about joint line left knee  PALPATION: Moderate TTP about left knee medial joint line  LOWER EXTREMITY ROM:  Active ROM Right eval Left eval  Hip flexion    Hip extension  Hip abduction    Hip adduction    Hip internal rotation    Hip external rotation    Knee flexion 120 95  Knee extension 0 6  Ankle dorsiflexion    Ankle plantarflexion    Ankle inversion    Ankle eversion     (Blank rows = not tested)  LOWER EXTREMITY MMT:  MMT Right eval Left eval  Hip flexion 31.2 33.1  Hip extension    Hip abduction 36.3 21.4  Hip adduction    Hip internal rotation    Hip external rotation    Knee flexion 4 3+ P!  Knee extension 4 3+ P!  Ankle dorsiflexion    Ankle plantarflexion    Ankle inversion    Ankle eversion     (Blank rows = not tested)    FUNCTIONAL TESTS:  Not tolerated.  Will test as tolerated. (Had conversion reaction)  GAIT: Distance walked: 400 ft Assistive device utilized:  no ad today although suggested to improve gait to use cane to setting Level of assistance: Modified independence Comments: antalgic gait left knee, decreased step length left knee and hip flex during swing                                                                                                                                 TREATMENT  Eval Self care: purpose and technique with use of cane.    PATIENT EDUCATION:  Education details: Discussed eval findings, rehab rationale, aquatic program progression/POC and pools in area. Patient is in agreement  Person educated: Patient Education method: Explanation Education comprehension: verbalized understanding  HOME EXERCISE PROGRAM: tba  ASSESSMENT:  CLINICAL IMPRESSION: Patient is a 63 y.o. f who was seen today for physical therapy evaluation and treatment for left knee pain. Pt well known to this clinic as she has been seen in the past for LBP. She presents today with left knee pain due to OA.  She demonstrates decreased strength and ROM of left knee complex as well as a gait deviation. Her pmhx include conversion disorder brought on by anxiety which tends to limit her toleration to therapy which she does experience today during evaluation. Eval cut short so pt able to manage. Pt is able to identify when symptoms are beginning and can alter the progression with deep breathing. This therapist is familiar with reaction as pt has had them in previous episodes.  She is a good candidate for aquatic therapy and will benefit from skilled intervention to improve deficits of decreased mobility and completion of ADL's and functional mobility due to left knee pain.  Will plan on completing functional testing next session.  OBJECTIVE IMPAIRMENTS: Abnormal gait, decreased activity tolerance, decreased balance, decreased knowledge of use of DME, decreased mobility, difficulty walking, decreased ROM, and pain.   ACTIVITY LIMITATIONS: bending, standing, squatting, stairs, transfers, bed mobility, and locomotion level  PARTICIPATION LIMITATIONS: cleaning, shopping, community activity, and yard work  PERSONAL FACTORS: Past/current experiences, Time since onset of injury/illness/exacerbation, and 3+ comorbidities: fibromyalgia, conversion  disorder, chronic lbp  are also affecting patient's functional outcome.   REHAB POTENTIAL: Good  CLINICAL DECISION MAKING: Evolving/moderate complexity  EVALUATION COMPLEXITY: Moderate   GOALS: Goals reviewed with patient? Yes  SHORT TERM GOALS: Target date: 04/20/24 Pt will tolerate full aquatic sessions consistently without increase in pain and with improving function to demonstrate good toleration and effectiveness of intervention.  Baseline: Goal status: INITIAL  2.  Pt will tolerate walking to and from setting (400-500 ft ea way) using AD as needed Baseline:  Goal status: INITIAL    LONG TERM GOALS: Target date: 05/28/24  Pt to improve on LEFS by at least 9 point to demonstrate statistically significant Improvement in function. Baseline: 27/80 Goal status: INITIAL  2.  Pt will improve left knee flex to 105d and extension to -3d Baseline: see chart Goal status: INITIAL  3.  Pt will improve left knee strength by 1 full grade or >. Will potentially measure in lbs as tolerated. Baseline: see chart Goal status: INITIAL  4.  Pt will demonstrate and improved gait pattern  Baseline:  Goal status: INITIAL  5.  Pt will report decrease in pain by at least 50% for improved toleration to activity/quality of life and to demonstrate improved management of pain. Baseline:  Goal status: INITIAL  6.  Pt will be indep with final HEP's (land and aquatic as appropriate) for continued management of condition Baseline:  Goal status: INITIAL   PLAN:  PT FREQUENCY: 2x/week  PT DURATION: 8 weeks anticipate 12 visits.  Extended time due to scheduling conflicts  PLANNED INTERVENTIONS: 97164- PT Re-evaluation, 97110-Therapeutic exercises, 97530- Therapeutic activity, 97112- Neuromuscular re-education, [phone]- Self Care, 95284- Manual therapy, [phone]- Gait training, [phone]- Electrical stimulation (unattended), [phone]- Ionotophoresis 4mg /ml Dexamethasone , Patient/Family education, Balance  training, Stair training, Taping, Dry Needling, Joint mobilization, DME instructions, Cryotherapy, and Moist heat  PLAN FOR NEXT SESSION: aquatics for strength, ROM/stretching lle, balance and proprioception retraining, stair climbing, pain reduciton and management   Lucinda Saber) Sian Joles MPT 04/04/24 5:51 PM [facility] [address] [place], [place], [address] Phone: [phone]   Fax:  [phone]   Referring diagnosis? Left knee epain Treatment diagnosis? (if different than referring diagnosis) no What was this (referring dx) caused by? []  Surgery []  Fall [x]  Ongoing issue [x]  Arthritis []  Other: ____________  Laterality: []  Rt [x]  Lt []  Both  Check all possible CPT codes:  *CHOOSE 10 OR LESS*    See Planned Interventions listed in the Plan section of the Evaluation.

## 2024-04-02 ENCOUNTER — Ambulatory Visit (HOSPITAL_BASED_OUTPATIENT_CLINIC_OR_DEPARTMENT_OTHER): Admitting: Physical Therapy

## 2024-04-04 ENCOUNTER — Other Ambulatory Visit: Payer: Self-pay

## 2024-04-04 ENCOUNTER — Encounter (HOSPITAL_BASED_OUTPATIENT_CLINIC_OR_DEPARTMENT_OTHER): Payer: Self-pay | Admitting: Physical Therapy

## 2024-04-06 DIAGNOSIS — K5900 Constipation, unspecified: Secondary | ICD-10-CM | POA: Diagnosis not present

## 2024-04-06 DIAGNOSIS — K760 Fatty (change of) liver, not elsewhere classified: Secondary | ICD-10-CM | POA: Diagnosis not present

## 2024-04-06 DIAGNOSIS — R140 Abdominal distension (gaseous): Secondary | ICD-10-CM | POA: Diagnosis not present

## 2024-04-06 DIAGNOSIS — R1030 Lower abdominal pain, unspecified: Secondary | ICD-10-CM | POA: Diagnosis not present

## 2024-04-08 ENCOUNTER — Ambulatory Visit (HOSPITAL_BASED_OUTPATIENT_CLINIC_OR_DEPARTMENT_OTHER): Admitting: Physical Therapy

## 2024-04-23 ENCOUNTER — Ambulatory Visit: Attending: Cardiology

## 2024-04-23 DIAGNOSIS — Z01810 Encounter for preprocedural cardiovascular examination: Secondary | ICD-10-CM

## 2024-04-23 NOTE — Telephone Encounter (Signed)
 Left message for pt to call back and schedule in office appt for preop per Charles Connor, NP.

## 2024-04-23 NOTE — Progress Notes (Signed)
 Virtual Visit via Telephone Note   Because of Paula [name] co-morbid illnesses, she is at least at moderate risk for complications without adequate follow up.  This format is felt to be most appropriate for this patient at this time.  Due to technical limitations with video connection Web designer), today's appointment will be conducted as an audio only telehealth visit, and Paula [name] verbally agreed to proceed in this manner.   All issues noted in this document were discussed and addressed.  No physical exam could be performed with this format.  Evaluation Performed:  Preoperative cardiovascular risk assessment _____________   Date:  04/23/2024   Patient ID:  Paula [name], DOB 11-13-1961, MRN [id number] Patient Location:  Home Provider location:   Office  Primary Care Provider:  Rae Bugler, MD Primary Cardiologist:  Kardie Tobb, DO  Chief Complaint / Patient Profile   63 y.o. y/o female with a h/o OSA (on CPAP), HTN, GERD, TIA HLD, anxiety, obesity, palpitations who is pending urologic sling placement and presents today for telephonic preoperative cardiovascular risk assessment.  History of Present Illness    Paula [name] is a 63 y.o. female who presents via audio/video conferencing for a telehealth visit today.  Pt was last seen in cardiology clinic on 06/23/2023 by Dr. Maximo Spar.  At that time Paula [name] was doing well but reported some palpitations.  Wore an event monitor that showed SVT and patient was advised to start Cardizem  for suppression. The patient is now pending procedure as outlined above. Since her last visit, she reports experiencing ongoing palpitations that occur with minimal exertion and last for 4 to 5 minutes.  She was previously prescribed Cardizem  but did not start due to a mixup with her pharmacy at [facility].  She also reports being under tremendous amount of stress dealing with her husband who has PTSD.  She also reports being in [place]  on vacation  and we will not be able to complete today's visit due to residency.  Due to patient's ongoing palpitations we will defer clearance over the phone and have patient come in for an in office visit.  We will no charge today's visit and contact patient to schedule an in office visit for clearance.    Past Medical History    Past Medical History:  Diagnosis Date   Anxiety    pt stated anxiety episodes resemble stroke symptoms   Chronic lower back pain    Conversion disorder    Depression    Diverticulitis    Expressive aphasia 11/15/2016   Fibromyalgia    GERD (gastroesophageal reflux disease)    Heart murmur    High cholesterol    History of gastric ulcer    History of hiatal hernia    History of kidney stones    History of thyroid  nodule    Hyperlipidemia    Hypertension    IBS (irritable bowel syndrome)    Intermittent vertigo    Migraine    "a few times/year now; maybe" (11/15/2016)   Obesity    OSA on CPAP    wears CPAP nightly ;study in epic 02-01-2014 (11/15/2016)   Osteoarthritis    "knees, hands, back, hips" (11/15/2016)   Pneumonia X 1   hx of   Psychogenic tremor    intermittant head tremor-(11/15/2016)   Refusal of blood transfusions as patient is Jehovah's Witness    RLS (restless legs syndrome)    Sleep apnea    CPAP   Urinary incontinence  Vertigo    Past Surgical History:  Procedure Laterality Date   ABDOMINAL HYSTERECTOMY  1990   Partial, still has ovaries   ANTERIOR CERVICAL DECOMP/DISCECTOMY FUSION  02/10/2004   C4 -- C7   BACK SURGERY     CARDIOVASCULAR STRESS TEST  2017   Negative   CESAREAN SECTION  1977; 1982   COLONOSCOPY     CYSTOSCOPY W/ RETROGRADES Right 06/07/2015   Procedure: CYSTOSCOPY WITH RETROGRADE PYELOGRAM;  Surgeon: Andrez Banker, MD;  Location: [facility];  Service: Urology;  Laterality: Right;   CYSTOSCOPY WITH URETEROSCOPY AND STENT PLACEMENT Right 06/07/2015   Procedure: RIGHT URETEROSCOPY, LASER  LITHOTRIPSY, STONE REMOVAL AND RIGHT URETERAL STENT PLACEMENT;  Surgeon: Andrez Banker, MD;  Location: [facility];  Service: Urology;  Laterality: Right;   DIRECT LARYNGOSCOPY N/A 10/29/2021   Procedure: DIRECT LARYNGOSCOPY WITH BIOPSY;  Surgeon: Virgina Grills, MD;  Location: [facility];  Service: ENT;  Laterality: N/A;   HERNIA REPAIR     HOLMIUM LASER APPLICATION N/A 06/07/2015   Procedure: HOLMIUM LASER APPLICATION;  Surgeon: Andrez Banker, MD;  Location: [facility];  Service: Urology;  Laterality: N/A;   LAPAROSCOPIC CHOLECYSTECTOMY  10/07/2000   LIPOMA EXCISION  01/2017   from neck   UMBILICAL HERNIA REPAIR  age 49   UPPER GI ENDOSCOPY      Allergies  Allergies  Allergen Reactions   Detrol [Tolterodine] Other (See Comments)    slurred speech, tremors, dizziness   Gabapentin  Palpitations and Other (See Comments)    Wt gain, tremor   Aspirin  Other (See Comments)    Avoids--- history of gastric ulcers    Contrast Media [Iodinated Contrast Media] Hives, Itching and Rash    CT contrast-Needed to take benadryl     Imitrex  [Sumatriptan ] Other (See Comments)    Body hurts   Oxycodone  Nausea And Vomiting   Milk-Related Compounds Other (See Comments)   Rosuvastatin  Other (See Comments)   Vicodin Hp [Hydrocodone -Acetaminophen ] Nausea And Vomiting   Avelox [Moxifloxacin] Itching    Home Medications    Prior to Admission medications   Medication Sig Start Date End Date Taking? Authorizing Provider  ACCU-CHEK GUIDE TEST test strip 1 each by Other route as needed. 11/06/23   [provider]  Accu-Chek Softclix Lancets lancets daily.    [provider]  acetaminophen  (TYLENOL ) 500 MG tablet Take 1,000 mg by mouth every 6 (six) hours as needed for headache (pain).    [provider]  albuterol  (VENTOLIN  HFA) 108 (90 Base) MCG/ACT inhaler 2 puffs every 6 (six) hours as needed for shortness of breath or wheezing. 01/11/21    [provider]  benzonatate  (TESSALON ) 100 MG capsule Take 1 capsule (100 mg total) by mouth every 8 (eight) hours. Patient not taking: Reported on 03/11/2024 06/08/22   Avegno, Komlanvi S, FNP  Blood Glucose Monitoring Suppl (ACCU-CHEK GUIDE) w/Device KIT USE AS DIRECTED ONCE DAILY TO CHECK BLOOD SUGAR LEVEL 30 DAYS    [provider]  Cholecalciferol (VITAMIN D -1000 MAX ST) 25 MCG (1000 UT) tablet Take by mouth.    [provider]  Cyanocobalamin  (VITAMIN B 12) 500 MCG TABS 1 tablet 03/19/22   [provider]  cyclobenzaprine  (FLEXERIL ) 10 MG tablet Take 1 tablet by mouth at bedtime as needed for muscle spasms.    [provider]  dexlansoprazole  (DEXILANT ) 60 MG capsule Take 60 mg by mouth at bedtime. 09/17/21   [provider]  dicyclomine  (  BENTYL ) 20 MG tablet Take 1 tablet (20 mg total) by mouth 2 (two) times daily. 10/29/23   Edmonia Gottron, PA-C  Evolocumab  (REPATHA  SURECLICK) 140 MG/ML SOAJ INJECT 1 ML INTO THE SKIN EVERY 14 DAYS 11/13/23   Hilty, Aviva Lemmings, MD  fluticasone  (FLONASE ) 50 MCG/ACT nasal spray Place 2 sprays into both nostrils as needed. 07/03/22   [provider]  GI Cocktail (alum & mag hydroxide, lidocaine , dicyclomine ) oral mixture 90 ml 2 % Viscous Lisocaine, 90 ml Dicyclomine  10mg /5 ml, 270ml maalox 400 mg 5 ml by mouth every 8 hours as needed for AB pain 10/29/23   Edmonia Gottron, PA-C  methylPREDNISolone  (MEDROL  DOSEPAK) 4 MG TBPK tablet Take 4 mg by mouth as directed. Patient not taking: Reported on 03/11/2024 10/22/23   [provider]  ondansetron  (ZOFRAN -ODT) 8 MG disintegrating tablet Take 1 tablet (8 mg total) by mouth every 8 (eight) hours as needed for nausea or vomiting. 10/29/23   Edmonia Gottron, PA-C  pantoprazole  (PROTONIX ) 40 MG tablet Take by mouth. 05/30/22   [provider]  phenazopyridine  (PYRIDIUM ) 95 MG tablet Take 1 tablet (95 mg total) by mouth 3 (three) times daily  as needed for pain. Patient not taking: Reported on 03/11/2024 02/20/23   Zuleta, Kaitlin G, NP  predniSONE  (DELTASONE ) 50 MG tablet Give 13 hours, 7 hours, and 1 hour prior to contrast dye injection. Notify pharmacy to schedule dose as appropriate based on procedure date/time. Patient not taking: Reported on 03/11/2024 10/29/23   Edmonia Gottron, PA-C  promethazine -dextromethorphan (PROMETHAZINE -DM) 6.25-15 MG/5ML syrup Take 5 mLs by mouth 4 (four) times daily as needed for cough. 03/11/24   Lind Repine, MD  rOPINIRole  (REQUIP ) 2 MG tablet Take 1 tablet (2 mg total) by mouth at bedtime. 11/09/23   Crain, Whitney L, PA  Semaglutide,0.25 or 0.5MG /DOS, (OZEMPIC, 0.25 OR 0.5 MG/DOSE,) 2 MG/1.5ML SOPN Inject 0.5 mg into the skin once a week. Patient not taking: Reported on 03/11/2024    [provider]  sucralfate  (CARAFATE ) 1 g tablet Take 1 tablet (1 g total) by mouth 4 (four) times daily -  with meals and at bedtime. 11/06/23   Edmonia Gottron, PA-C  SYMBICORT  160-4.5 MCG/ACT inhaler as needed. 07/04/22   [provider]  traMADol  (ULTRAM ) 50 MG tablet Take 50 mg by mouth 3 (three) times daily as needed. 10/22/23   [provider]  Vibegron  75 MG TABS Take 1 tablet (75 mg total) by mouth daily. 03/03/24   Zuleta, Kaitlin G, NP  Vitamin E 670 MG (1000 UT) CAPS Take by mouth.    [provider]    Physical Exam    Vital Signs:  Paula [name] does not have vital signs available for review today.  Given telephonic nature of communication, physical exam is limited. AAOx3. NAD. Normal affect.  Speech and respirations are unlabored.  Accessory Clinical Findings    None  Assessment & Plan    1.  Preoperative Cardiovascular Risk Assessment: - Patient's RCRI score 6.6%  The patient was advised that if she develops new symptoms prior to surgery to contact our office to arrange for a follow-up visit, and she verbalized understanding.    A copy of this note will  be routed to requesting surgeon.  Time:   Today, I have spent  minutes with the patient with telehealth technology discussing medical history, symptoms, and management plan.     Paula [name], Retha Cast, NP  04/23/2024, 7:14  AM

## 2024-04-23 NOTE — Progress Notes (Signed)
 Left message for pt to call back and schedule in office appt for preop per Charles Connor, NP.

## 2024-04-28 ENCOUNTER — Telehealth: Payer: Self-pay

## 2024-04-28 ENCOUNTER — Ambulatory Visit (HOSPITAL_BASED_OUTPATIENT_CLINIC_OR_DEPARTMENT_OTHER): Admitting: Physical Therapy

## 2024-04-28 ENCOUNTER — Encounter (HOSPITAL_BASED_OUTPATIENT_CLINIC_OR_DEPARTMENT_OTHER): Payer: Self-pay | Admitting: Physical Therapy

## 2024-04-28 DIAGNOSIS — R262 Difficulty in walking, not elsewhere classified: Secondary | ICD-10-CM

## 2024-04-28 DIAGNOSIS — G8929 Other chronic pain: Secondary | ICD-10-CM

## 2024-04-28 DIAGNOSIS — M6281 Muscle weakness (generalized): Secondary | ICD-10-CM

## 2024-04-28 DIAGNOSIS — M25562 Pain in left knee: Secondary | ICD-10-CM | POA: Diagnosis not present

## 2024-04-28 NOTE — Therapy (Signed)
 OUTPATIENT PHYSICAL THERAPY LOWER EXTREMITY EVALUATION   Patient Name: Paula [name] MRN: [id number] DOB:10/26/1961, 63 y.o., female Today's Date: 04/28/2024  END OF SESSION:    PT End of Session - 04/28/24 0914     Visit Number 2    Date for PT Re-Evaluation 05/28/24    Authorization Type [employer]    PT Start Time [phone]    PT Stop Time 0930    PT Time Calculation (min) 40 min    Activity Tolerance Patient tolerated treatment well    Behavior During Therapy [facility] ([place] for tasks assessed/performed   slight conversion reaction at end of session              Past Medical History:  Diagnosis Date   Anxiety    pt stated anxiety episodes resemble stroke symptoms   Chronic lower back pain    Conversion disorder    Depression    Diverticulitis    Expressive aphasia 11/15/2016   Fibromyalgia    GERD (gastroesophageal reflux disease)    Heart murmur    High cholesterol    History of gastric ulcer    History of hiatal hernia    History of kidney stones    History of thyroid  nodule    Hyperlipidemia    Hypertension    IBS (irritable bowel syndrome)    Intermittent vertigo    Migraine    "a few times/year now; maybe" (11/15/2016)   Obesity    OSA on CPAP    wears CPAP nightly ;study in epic 02-01-2014 (11/15/2016)   Osteoarthritis    "knees, hands, back, hips" (11/15/2016)   Pneumonia X 1   hx of   Psychogenic tremor    intermittant head tremor-(11/15/2016)   Refusal of blood transfusions as patient is Jehovah's Witness    RLS (restless legs syndrome)    Sleep apnea    CPAP   Urinary incontinence    Vertigo    Past Surgical History:  Procedure Laterality Date   ABDOMINAL HYSTERECTOMY  1990   Partial, still has ovaries   ANTERIOR CERVICAL DECOMP/DISCECTOMY FUSION  02/10/2004   C4 -- C7   BACK SURGERY     CARDIOVASCULAR STRESS TEST  2017   Negative   CESAREAN SECTION  1977; 1982   COLONOSCOPY     CYSTOSCOPY W/ RETROGRADES Right 06/07/2015   Procedure:  CYSTOSCOPY WITH RETROGRADE PYELOGRAM;  Surgeon: Andrez Banker, MD;  Location: [facility];  Service: Urology;  Laterality: Right;   CYSTOSCOPY WITH URETEROSCOPY AND STENT PLACEMENT Right 06/07/2015   Procedure: RIGHT URETEROSCOPY, LASER LITHOTRIPSY, STONE REMOVAL AND RIGHT URETERAL STENT PLACEMENT;  Surgeon: Andrez Banker, MD;  Location: [facility];  Service: Urology;  Laterality: Right;   DIRECT LARYNGOSCOPY N/A 10/29/2021   Procedure: DIRECT LARYNGOSCOPY WITH BIOPSY;  Surgeon: Virgina Grills, MD;  Location: [facility] OR;  Service: ENT;  Laterality: N/A;   HERNIA REPAIR     HOLMIUM LASER APPLICATION N/A 06/07/2015   Procedure: HOLMIUM LASER APPLICATION;  Surgeon: Andrez Banker, MD;  Location: [facility];  Service: Urology;  Laterality: N/A;   LAPAROSCOPIC CHOLECYSTECTOMY  10/07/2000   LIPOMA EXCISION  01/2017   from neck   UMBILICAL HERNIA REPAIR  age 45   UPPER GI ENDOSCOPY     Patient Active Problem List   Diagnosis Date Noted   OAB (overactive bladder) 01/02/2024   SUI (stress urinary incontinence, female) 01/02/2024   Precordial pain 12/07/2021   Chest pain  12/07/2021   Prediabetes 12/07/2021   Preprocedural examination 12/07/2021   Hyperlipidemia 12/07/2021   Palpitations 12/07/2021   Physical deconditioning 08/22/2021   History of COVID-19 01/04/2021   Adult onset stuttering 06/30/2020   Migraine 06/30/2020   Fibromyalgia 06/30/2020   Type 2 diabetes mellitus without complication (HCC) 06/30/2020   TIA (transient ischemic attack) 06/30/2020   Atypical chest pain 08/13/2018   Depression, major 03/17/2018   Chronic pain syndrome 03/17/2018   Expressive aphasia 11/15/2016   Chronic cough 11/08/2015   B12 deficiency 12/07/2014   Wart viral 11/07/2014   Coarse tremors 02/09/2014   OSA on CPAP 01/28/2014   Functional neurological symptom disorder (conversion disorder), with abnormal movement 01/14/2014   Dyspnea 01/12/2014    Tremor of face and hands 01/12/2014   Breast pain in female 02/09/2013   Elevated WBC count 10/12/2012   Abdominal pain, other specified site 10/09/2012   Abdominal bloating 10/09/2012   Ovarian cystic mass 05/25/2012   Shoulder pain, right 03/11/2012   Right arm pain 12/30/2011   Acute sinusitis 07/30/2010   ACNE, ROSACEA 07/17/2010   LEG PAIN 07/17/2010   NECK PAIN 05/10/2010   ABDOMINAL PAIN, EPIGASTRIC 05/10/2010   BURSITIS, LEFT HIP 05/01/2010   PLANTAR FASCIITIS, BILATERAL 05/01/2010   HYPERGLYCEMIA 01/05/2010   PRURITUS 11/27/2009   TOBACCO USE, QUIT 11/27/2009   Anxiety state 11/22/2008   FATIGUE 11/22/2008   Essential hypertension 10/07/2008   Insomnia, unspecified 08/30/2008   Obesity 08/19/2008   NAUSEA ALONE 05/02/2008   VERTIGO 01/19/2008   DIARRHEA 01/19/2008   THYROID  NODULE 06/23/2007   Dyslipidemia 06/23/2007   Situational depression 06/23/2007   ADD 06/23/2007   TMJ SYNDROME 06/23/2007   GERD 06/23/2007   Irritable bowel syndrome 06/23/2007   Headache 06/23/2007    PCP: Rickford Charnley MD  REFERRING PROVIDER:   Rance Burrows, MD    REFERRING DIAG: [phone] (ICD-10-CM) - Pain in left knee   THERAPY DIAG:  Chronic pain of left knee  Muscle weakness (generalized)  Difficulty in walking, not elsewhere classified  Rationale for Evaluation and Treatment: Rehabilitation  ONSET DATE: 1 year  SUBJECTIVE:   SUBJECTIVE STATEMENT:  Knee is always bad 8/10.  Injection didn't work   Initial subjective Pt reports knee pain for the past year with increasing difficulty walking.  Also has chronic LBP.  She reports she has not been exercising in the past few months.  She is hoping to decrease her pain and walk better  PERTINENT HISTORY: Conversion disorder: looks like stroke more frequent. Triggers are eye movements, certain words. Have not been having many recently PAIN:  Are you having pain? Yes: NPRS scale: current 10/10 Pain location: l  knee Pain description: sharp achy Aggravating factors: walking 6-7 minutes; standing Relieving factors: rest   PRECAUTIONS: None  RED FLAGS: None   WEIGHT BEARING RESTRICTIONS: no  FALLS:  Has patient fallen in last 6 months? No  LIVING ENVIRONMENT: Lives with: lives with their family and lives with their spouse Lives in: House/apartment Stairs: Yes: External: 1 steps; none Has following equipment at home: None  OCCUPATION: retired  PLOF: Independent  PATIENT GOALS: more mobility, less pain  NEXT MD VISIT: as needed  OBJECTIVE:  Note: Objective measures were completed at Evaluation unless otherwise noted.  DIAGNOSTIC FINDINGS: none in chart  PATIENT SURVEYS:  LEFS 27/80  COGNITION: Overall cognitive status: Within functional limits for tasks assessed     SENSATION: WFL  EDEMA:  Mild edema about joint line left knee  PALPATION: Moderate TTP about left knee medial joint line  LOWER EXTREMITY ROM:  Active ROM Right eval Left eval  Hip flexion    Hip extension    Hip abduction    Hip adduction    Hip internal rotation    Hip external rotation    Knee flexion 120 95  Knee extension 0 6  Ankle dorsiflexion    Ankle plantarflexion    Ankle inversion    Ankle eversion     (Blank rows = not tested)  LOWER EXTREMITY MMT:  MMT Right eval Left eval  Hip flexion 31.2 33.1  Hip extension    Hip abduction 36.3 21.4  Hip adduction    Hip internal rotation    Hip external rotation    Knee flexion 4 3+ P!  Knee extension 4 3+ P!  Ankle dorsiflexion    Ankle plantarflexion    Ankle inversion    Ankle eversion     (Blank rows = not tested)    FUNCTIONAL TESTS:  Not tolerated.  Will test as tolerated. (Had conversion reaction)  GAIT: Distance walked: 400 ft Assistive device utilized: no ad today although suggested to improve gait to use cane to setting Level of assistance: Modified independence Comments: antalgic gait left knee, decreased  step length left knee and hip flex during swing                                                                                                                                TREATMENT  [facility] Adult PT Treatment:                                                DATE: 04/28/24 Pt seen for aquatic therapy today.  Treatment took place in water 3.5-4.75 ft in depth at the [facility] pool. Temp of water was 91.  Pt entered/exited the pool via stairs using step to pattern with hand rail.  *Re-intro to setting *walking forward, back and side stepping in 3.6 ft with ue support of barbell 3.6 ft- 4.0 *seated on lift: cycling; hip add/abd; LAQ *Ue support on wall 3.6 ft then 4.0: toe raises; heel raises; hip add/abd; hip flex/extension; relaxed squats *Return to walking/marching in 4.0 ft.  Forward back and side stepping *Yellow HB carry forward and back 2 widths each.  Instruction on abd bracing for core engagement and balance  Pt requires the buoyancy and hydrostatic pressure of water for support, and to offload joints by unweighting joint load by at least 50 % in navel deep water and by at least 75-80% in chest to neck deep water.  Viscosity of the water is needed for resistance of strengthening. Water current perturbations provides challenge to standing balance requiring increased core activation.      PATIENT EDUCATION:  Education  details: Discussed eval findings, rehab rationale, aquatic program progression/POC and pools in area. Patient is in agreement  Person educated: Patient Education method: Explanation Education comprehension: verbalized understanding  HOME EXERCISE PROGRAM: tba  ASSESSMENT:  CLINICAL IMPRESSION: Pt demonstrates safety and independence in aquatic setting with therapist instructing from deck. Pt is confident in setting, moving through 3.6-4.0 ft easily.  She is directed through various movement patterns and trials in both sitting and standing positions with  some left knee discomfort which doe decline with duration of session. Of Note: when pt completes activity she is hesitant with she needs extra time to combat/control her anxiety response. If pause is too long discontinue activity. (It is evident in her facial expression).  Used the lift to exit to preserve reduction in knee pain avoiding increase with stair climbing.  Slight conversion reaction once exiting pool. Goals are ongoing.     Initial Impression Patient is a 63 y.o. f who was seen today for physical therapy evaluation and treatment for left knee pain. Pt well known to this clinic as she has been seen in the past for LBP. She presents today with left knee pain due to OA.  She demonstrates decreased strength and ROM of left knee complex as well as a gait deviation. Her pmhx include conversion disorder brought on by anxiety which tends to limit her toleration to therapy which she does experience today during evaluation. Eval cut short so pt able to manage. Pt is able to identify when symptoms are beginning and can alter the progression with deep breathing. This therapist is familiar with reaction as pt has had them in previous episodes.  She is a good candidate for aquatic therapy and will benefit from skilled intervention to improve deficits of decreased mobility and completion of ADL's and functional mobility due to left knee pain.  Will plan on completing functional testing next session.  OBJECTIVE IMPAIRMENTS: Abnormal gait, decreased activity tolerance, decreased balance, decreased knowledge of use of DME, decreased mobility, difficulty walking, decreased ROM, and pain.   ACTIVITY LIMITATIONS: bending, standing, squatting, stairs, transfers, bed mobility, and locomotion level  PARTICIPATION LIMITATIONS: cleaning, shopping, community activity, and yard work  PERSONAL FACTORS: Past/current experiences, Time since onset of injury/illness/exacerbation, and 3+ comorbidities: fibromyalgia,  conversion disorder, chronic lbp are also affecting patient's functional outcome.   REHAB POTENTIAL: Good  CLINICAL DECISION MAKING: Evolving/moderate complexity  EVALUATION COMPLEXITY: Moderate   GOALS: Goals reviewed with patient? Yes  SHORT TERM GOALS: Target date: 04/20/24 Pt will tolerate full aquatic sessions consistently without increase in pain and with improving function to demonstrate good toleration and effectiveness of intervention.  Baseline: Goal status: INITIAL  2.  Pt will tolerate walking to and from setting (400-500 ft ea way) using AD as needed Baseline:  Goal status: INITIAL    LONG TERM GOALS: Target date: 05/28/24  Pt to improve on LEFS by at least 9 point to demonstrate statistically significant Improvement in function. Baseline: 27/80 Goal status: INITIAL  2.  Pt will improve left knee flex to 105d and extension to -3d Baseline: see chart Goal status: INITIAL  3.  Pt will improve left knee strength by 1 full grade or >. Will potentially measure in lbs as tolerated. Baseline: see chart Goal status: INITIAL  4.  Pt will demonstrate and improved gait pattern  Baseline:  Goal status: INITIAL  5.  Pt will report decrease in pain by at least 50% for improved toleration to activity/quality of life and to demonstrate improved  management of pain. Baseline:  Goal status: INITIAL  6.  Pt will be indep with final HEP's (land and aquatic as appropriate) for continued management of condition Baseline:  Goal status: INITIAL   PLAN:  PT FREQUENCY: 2x/week  PT DURATION: 8 weeks anticipate 12 visits.  Extended time due to scheduling conflicts  PLANNED INTERVENTIONS: 97164- PT Re-evaluation, 97110-Therapeutic exercises, 97530- Therapeutic activity, 97112- Neuromuscular re-education, [phone]- Self Care, 60454- Manual therapy, [phone]- Gait training, [phone]- Electrical stimulation (unattended), [phone]- Ionotophoresis 4mg /ml Dexamethasone , Patient/Family education,  Balance training, Stair training, Taping, Dry Needling, Joint mobilization, DME instructions, Cryotherapy, and Moist heat  PLAN FOR NEXT SESSION: aquatics for strength, ROM/stretching lle, balance and proprioception retraining, stair climbing, pain reduciton and management   Lucinda Saber) Kemari Narez MPT 04/28/24 9:41 AM [facility] [address] [place], [place], [address] Phone: [phone]   Fax:  [phone]   Referring diagnosis? Left knee epain Treatment diagnosis? (if different than referring diagnosis) no What was this (referring dx) caused by? []  Surgery []  Fall [x]  Ongoing issue [x]  Arthritis []  Other: ____________  Laterality: []  Rt [x]  Lt []  Both  Check all possible CPT codes:  *CHOOSE 10 OR LESS*    See Planned Interventions listed in the Plan section of the Evaluation.

## 2024-04-28 NOTE — Telephone Encounter (Signed)
 Pt schedule for office visit on 05/20/24.

## 2024-04-28 NOTE — Telephone Encounter (Signed)
 LVM asking pt to call our office to schedule an office visit for preop clearance. Please reach out to preop team if pt returns cal. Thank you.

## 2024-04-30 ENCOUNTER — Ambulatory Visit (HOSPITAL_BASED_OUTPATIENT_CLINIC_OR_DEPARTMENT_OTHER): Admitting: Physical Therapy

## 2024-04-30 ENCOUNTER — Encounter (HOSPITAL_BASED_OUTPATIENT_CLINIC_OR_DEPARTMENT_OTHER): Payer: Self-pay | Admitting: Physical Therapy

## 2024-04-30 DIAGNOSIS — M6281 Muscle weakness (generalized): Secondary | ICD-10-CM | POA: Diagnosis not present

## 2024-04-30 DIAGNOSIS — R262 Difficulty in walking, not elsewhere classified: Secondary | ICD-10-CM | POA: Diagnosis not present

## 2024-04-30 DIAGNOSIS — M25562 Pain in left knee: Secondary | ICD-10-CM | POA: Diagnosis not present

## 2024-04-30 DIAGNOSIS — G8929 Other chronic pain: Secondary | ICD-10-CM | POA: Diagnosis not present

## 2024-04-30 NOTE — Therapy (Signed)
 OUTPATIENT PHYSICAL THERAPY LOWER EXTREMITY TREATMENT   Patient Name: Paula [name] MRN: [id number] DOB:1961-06-15, 63 y.o., female Today's Date: 04/30/2024  END OF SESSION:    PT End of Session - 04/30/24 0850     Visit Number 3    Date for PT Re-Evaluation 05/28/24    Authorization Type [employer]    PT Start Time [phone]    PT Stop Time 0928    PT Time Calculation (min) 38 min    Behavior During Therapy [facility] for tasks assessed/performed               Past Medical History:  Diagnosis Date   Anxiety    pt stated anxiety episodes resemble stroke symptoms   Chronic lower back pain    Conversion disorder    Depression    Diverticulitis    Expressive aphasia 11/15/2016   Fibromyalgia    GERD (gastroesophageal reflux disease)    Heart murmur    High cholesterol    History of gastric ulcer    History of hiatal hernia    History of kidney stones    History of thyroid  nodule    Hyperlipidemia    Hypertension    IBS (irritable bowel syndrome)    Intermittent vertigo    Migraine    "a few times/year now; maybe" (11/15/2016)   Obesity    OSA on CPAP    wears CPAP nightly ;study in epic 02-01-2014 (11/15/2016)   Osteoarthritis    "knees, hands, back, hips" (11/15/2016)   Pneumonia X 1   hx of   Psychogenic tremor    intermittant head tremor-(11/15/2016)   Refusal of blood transfusions as patient is Jehovah's Witness    RLS (restless legs syndrome)    Sleep apnea    CPAP   Urinary incontinence    Vertigo    Past Surgical History:  Procedure Laterality Date   ABDOMINAL HYSTERECTOMY  1990   Partial, still has ovaries   ANTERIOR CERVICAL DECOMP/DISCECTOMY FUSION  02/10/2004   C4 -- C7   BACK SURGERY     CARDIOVASCULAR STRESS TEST  2017   Negative   CESAREAN SECTION  1977; 1982   COLONOSCOPY     CYSTOSCOPY W/ RETROGRADES Right 06/07/2015   Procedure: CYSTOSCOPY WITH RETROGRADE PYELOGRAM;  Surgeon: Andrez Banker, MD;  Location: [facility];  Service: Urology;  Laterality: Right;   CYSTOSCOPY WITH URETEROSCOPY AND STENT PLACEMENT Right 06/07/2015   Procedure: RIGHT URETEROSCOPY, LASER LITHOTRIPSY, STONE REMOVAL AND RIGHT URETERAL STENT PLACEMENT;  Surgeon: Andrez Banker, MD;  Location: [facility];  Service: Urology;  Laterality: Right;   DIRECT LARYNGOSCOPY N/A 10/29/2021   Procedure: DIRECT LARYNGOSCOPY WITH BIOPSY;  Surgeon: Virgina Grills, MD;  Location: [facility] OR;  Service: ENT;  Laterality: N/A;   HERNIA REPAIR     HOLMIUM LASER APPLICATION N/A 06/07/2015   Procedure: HOLMIUM LASER APPLICATION;  Surgeon: Andrez Banker, MD;  Location: [facility];  Service: Urology;  Laterality: N/A;   LAPAROSCOPIC CHOLECYSTECTOMY  10/07/2000   LIPOMA EXCISION  01/2017   from neck   UMBILICAL HERNIA REPAIR  age 23   UPPER GI ENDOSCOPY     Patient Active Problem List   Diagnosis Date Noted   OAB (overactive bladder) 01/02/2024   SUI (stress urinary incontinence, female) 01/02/2024   Precordial pain 12/07/2021   Chest pain 12/07/2021   Prediabetes 12/07/2021   Preprocedural examination 12/07/2021   Hyperlipidemia 12/07/2021   Palpitations  12/07/2021   Physical deconditioning 08/22/2021   History of COVID-19 01/04/2021   Adult onset stuttering 06/30/2020   Migraine 06/30/2020   Fibromyalgia 06/30/2020   Type 2 diabetes mellitus without complication (HCC) 06/30/2020   TIA (transient ischemic attack) 06/30/2020   Atypical chest pain 08/13/2018   Depression, major 03/17/2018   Chronic pain syndrome 03/17/2018   Expressive aphasia 11/15/2016   Chronic cough 11/08/2015   B12 deficiency 12/07/2014   Wart viral 11/07/2014   Coarse tremors 02/09/2014   OSA on CPAP 01/28/2014   Functional neurological symptom disorder (conversion disorder), with abnormal movement 01/14/2014   Dyspnea 01/12/2014   Tremor of face and hands 01/12/2014   Breast pain in female 02/09/2013   Elevated WBC count  10/12/2012   Abdominal pain, other specified site 10/09/2012   Abdominal bloating 10/09/2012   Ovarian cystic mass 05/25/2012   Shoulder pain, right 03/11/2012   Right arm pain 12/30/2011   Acute sinusitis 07/30/2010   ACNE, ROSACEA 07/17/2010   LEG PAIN 07/17/2010   NECK PAIN 05/10/2010   ABDOMINAL PAIN, EPIGASTRIC 05/10/2010   BURSITIS, LEFT HIP 05/01/2010   PLANTAR FASCIITIS, BILATERAL 05/01/2010   HYPERGLYCEMIA 01/05/2010   PRURITUS 11/27/2009   TOBACCO USE, QUIT 11/27/2009   Anxiety state 11/22/2008   FATIGUE 11/22/2008   Essential hypertension 10/07/2008   Insomnia, unspecified 08/30/2008   Obesity 08/19/2008   NAUSEA ALONE 05/02/2008   VERTIGO 01/19/2008   DIARRHEA 01/19/2008   THYROID  NODULE 06/23/2007   Dyslipidemia 06/23/2007   Situational depression 06/23/2007   ADD 06/23/2007   TMJ SYNDROME 06/23/2007   GERD 06/23/2007   Irritable bowel syndrome 06/23/2007   Headache 06/23/2007    PCP: Rickford Charnley MD  REFERRING PROVIDER:   Rance Burrows, MD    REFERRING DIAG: [phone] (ICD-10-CM) - Pain in left knee   THERAPY DIAG:  Chronic pain of left knee  Muscle weakness (generalized)  Difficulty in walking, not elsewhere classified  Rationale for Evaluation and Treatment: Rehabilitation  ONSET DATE: 1 year  SUBJECTIVE:   SUBJECTIVE STATEMENT: Pt reports some relief with last session.  She reports that she was a member of [employer], but they cancelled membership last fall  after her husband's knee replacement. She is hopeful to join again after his knee feels better.  She used cane today for support on walk to/from pool.   POOL ACCESS: has silver sneakers, can go to [facility]   Initial subjective Pt reports knee pain for the past year with increasing difficulty walking.  Also has chronic LBP.  She reports she has not been exercising in the past few months.  She is hoping to decrease her pain and walk better  PERTINENT HISTORY: Conversion disorder: looks  like stroke more frequent. Triggers are eye movements, certain words. Have not been having many recently PAIN:  Are you having pain? Yes: NPRS scale: current 6/10 Pain location: L knee Pain description: sharp achy Aggravating factors: walking 6-7 minutes; standing Relieving factors: rest   PRECAUTIONS: None  RED FLAGS: None   WEIGHT BEARING RESTRICTIONS: no  FALLS:  Has patient fallen in last 6 months? No  LIVING ENVIRONMENT: Lives with: lives with their family and lives with their spouse Lives in: House/apartment Stairs: Yes: External: 1 steps; none Has following equipment at home: None  OCCUPATION: retired  PLOF: Independent  PATIENT GOALS: more mobility, less pain  NEXT MD VISIT: as needed  OBJECTIVE:  Note: Objective measures were completed at Evaluation unless otherwise noted.  DIAGNOSTIC FINDINGS: none in  chart  PATIENT SURVEYS:  LEFS 27/80  COGNITION: Overall cognitive status: Within functional limits for tasks assessed     SENSATION: WFL  EDEMA:  Mild edema about joint line left knee  PALPATION: Moderate TTP about left knee medial joint line  LOWER EXTREMITY ROM:  Active ROM Right eval Left eval  Hip flexion    Hip extension    Hip abduction    Hip adduction    Hip internal rotation    Hip external rotation    Knee flexion 120 95  Knee extension 0 6  Ankle dorsiflexion    Ankle plantarflexion    Ankle inversion    Ankle eversion     (Blank rows = not tested)  LOWER EXTREMITY MMT:  MMT Right eval Left eval  Hip flexion 31.2 33.1  Hip extension    Hip abduction 36.3 21.4  Hip adduction    Hip internal rotation    Hip external rotation    Knee flexion 4 3+ P!  Knee extension 4 3+ P!  Ankle dorsiflexion    Ankle plantarflexion    Ankle inversion    Ankle eversion     (Blank rows = not tested)    FUNCTIONAL TESTS:  Not tolerated.  Will test as tolerated. (Had conversion reaction)  GAIT: Distance walked: 400  ft Assistive device utilized: no ad today although suggested to improve gait to use cane to setting Level of assistance: Modified independence Comments: antalgic gait left knee, decreased step length left knee and hip flex during swing                                                                                                                                TREATMENT  [facility] Adult PT Treatment:                                                DATE: 04/30/24 Pt seen for aquatic therapy today.  Treatment took place in water 3.5-4.75 ft in depth at the [facility] pool. Temp of water was 91.  Pt entered/exited the pool via stairs using step to pattern with hand rail.  *unsupported: walking forward, backwards and side stepping in >3.6 ft  *UE support on wall: toe raises /heel raises x 10; hip add/abd x 10; hip flex/extension x 10 each; relaxed squats *seated on bench in water: cycling; hip add/abd; LAQ * TrA set with short hollow noodle pull down to thighs x 10 *Suitcase carry with single Yellow hand float  walking forward/backward  Pt requires the buoyancy and hydrostatic pressure of water for support, and to offload joints by unweighting joint load by at least 50 % in navel deep water and by at least 75-80% in chest to neck deep water.  Viscosity of the water is needed for resistance  of strengthening. Water current perturbations provides challenge to standing balance requiring increased core activation.   * once dried off: applied reg Rock tape to medial left knee at joint line to decompress tissue and increase proprioception. Educated pt on safe tape removal; pt verbalized understanding.   PATIENT EDUCATION:  Education details: reacquainting to aquatic therapy Person educated: Patient Education method: Explanation Education comprehension: verbalized understanding  HOME EXERCISE PROGRAM: tba  ASSESSMENT:  CLINICAL IMPRESSION: Pt reported increase in L knee pain with increased  knee flexion; eased with exercises when knee is in extension.  Gradual reduction in knee pain during session.  Discussed pt gaining access to pool (ie:GAC) to continue getting relief outside of therapy sessions.   Goals are ongoing.    Of Note: when pt completes activity she is hesitant with she needs extra time to combat/control her anxiety response. If pause is too long discontinue activity. (It is evident in her facial expression).     Initial Impression Patient is a 63 y.o. f who was seen today for physical therapy evaluation and treatment for left knee pain. Pt well known to this clinic as she has been seen in the past for LBP. She presents today with left knee pain due to OA.  She demonstrates decreased strength and ROM of left knee complex as well as a gait deviation. Her pmhx include conversion disorder brought on by anxiety which tends to limit her toleration to therapy which she does experience today during evaluation. Eval cut short so pt able to manage. Pt is able to identify when symptoms are beginning and can alter the progression with deep breathing. This therapist is familiar with reaction as pt has had them in previous episodes.  She is a good candidate for aquatic therapy and will benefit from skilled intervention to improve deficits of decreased mobility and completion of ADL's and functional mobility due to left knee pain.  Will plan on completing functional testing next session.  OBJECTIVE IMPAIRMENTS: Abnormal gait, decreased activity tolerance, decreased balance, decreased knowledge of use of DME, decreased mobility, difficulty walking, decreased ROM, and pain.   ACTIVITY LIMITATIONS: bending, standing, squatting, stairs, transfers, bed mobility, and locomotion level  PARTICIPATION LIMITATIONS: cleaning, shopping, community activity, and yard work  PERSONAL FACTORS: Past/current experiences, Time since onset of injury/illness/exacerbation, and 3+ comorbidities: fibromyalgia,  conversion disorder, chronic lbp are also affecting patient's functional outcome.   REHAB POTENTIAL: Good  CLINICAL DECISION MAKING: Evolving/moderate complexity  EVALUATION COMPLEXITY: Moderate   GOALS: Goals reviewed with patient? Yes  SHORT TERM GOALS: Target date: 04/20/24 Pt will tolerate full aquatic sessions consistently without increase in pain and with improving function to demonstrate good toleration and effectiveness of intervention.  Baseline: Goal status: In Progress - 04/30/24  2.  Pt will tolerate walking to and from setting (400-500 ft ea way) using AD as needed Baseline:  Goal status: In progress - 04/30/24    LONG TERM GOALS: Target date: 05/28/24  Pt to improve on LEFS by at least 9 point to demonstrate statistically significant Improvement in function. Baseline: 27/80 Goal status: INITIAL  2.  Pt will improve left knee flex to 105d and extension to -3d Baseline: see chart Goal status: INITIAL  3.  Pt will improve left knee strength by 1 full grade or >. Will potentially measure in lbs as tolerated. Baseline: see chart Goal status: INITIAL  4.  Pt will demonstrate and improved gait pattern  Baseline:  Goal status: INITIAL  5.  Pt will report  decrease in pain by at least 50% for improved toleration to activity/quality of life and to demonstrate improved management of pain. Baseline:  Goal status: INITIAL  6.  Pt will be indep with final HEP's (land and aquatic as appropriate) for continued management of condition Baseline:  Goal status: INITIAL   PLAN:  PT FREQUENCY: 2x/week  PT DURATION: 8 weeks anticipate 12 visits.  Extended time due to scheduling conflicts  PLANNED INTERVENTIONS: 97164- PT Re-evaluation, 97110-Therapeutic exercises, 97530- Therapeutic activity, 97112- Neuromuscular re-education, [phone]- Self Care, 02725- Manual therapy, [phone]- Gait training, [phone]- Electrical stimulation (unattended), [phone]- Ionotophoresis 4mg /ml Dexamethasone ,  Patient/Family education, Balance training, Stair training, Taping, Dry Needling, Joint mobilization, DME instructions, Cryotherapy, and Moist heat  PLAN FOR NEXT SESSION: aquatics for strength, ROM/stretching lle, balance and proprioception retraining, stair climbing, pain reduciton and management  Almedia Jacobsen, PTA 04/30/24 9:50 AM [facility] [address] [place], [place], [address] Phone: [phone]   Fax:  [phone]   Referring diagnosis? Left knee epain Treatment diagnosis? (if different than referring diagnosis) no What was this (referring dx) caused by? []  Surgery []  Fall [x]  Ongoing issue [x]  Arthritis []  Other: ____________  Laterality: []  Rt [x]  Lt []  Both  Check all possible CPT codes:  *CHOOSE 10 OR LESS*    See Planned Interventions listed in the Plan section of the Evaluation.

## 2024-05-03 ENCOUNTER — Ambulatory Visit (HOSPITAL_BASED_OUTPATIENT_CLINIC_OR_DEPARTMENT_OTHER): Admitting: Pulmonary Disease

## 2024-05-04 ENCOUNTER — Encounter (HOSPITAL_BASED_OUTPATIENT_CLINIC_OR_DEPARTMENT_OTHER): Payer: Self-pay | Admitting: Physical Therapy

## 2024-05-04 ENCOUNTER — Ambulatory Visit (HOSPITAL_BASED_OUTPATIENT_CLINIC_OR_DEPARTMENT_OTHER): Attending: Sports Medicine | Admitting: Physical Therapy

## 2024-05-04 DIAGNOSIS — G8929 Other chronic pain: Secondary | ICD-10-CM | POA: Insufficient documentation

## 2024-05-04 DIAGNOSIS — M6281 Muscle weakness (generalized): Secondary | ICD-10-CM | POA: Diagnosis not present

## 2024-05-04 DIAGNOSIS — M25562 Pain in left knee: Secondary | ICD-10-CM | POA: Insufficient documentation

## 2024-05-04 DIAGNOSIS — R262 Difficulty in walking, not elsewhere classified: Secondary | ICD-10-CM | POA: Diagnosis not present

## 2024-05-04 NOTE — Therapy (Signed)
 OUTPATIENT PHYSICAL THERAPY LOWER EXTREMITY TREATMENT   Patient Name: Paula [name] MRN: [id number] DOB:December 08, 1960, 63 y.o., female Today's Date: 05/04/2024  END OF SESSION:    PT End of Session - 05/04/24 0931     Visit Number 4    Date for PT Re-Evaluation 05/28/24    Authorization Type [employer]    PT Start Time 0930    PT Stop Time 1010    PT Time Calculation (min) 40 min    Activity Tolerance Patient tolerated treatment well    Behavior During Therapy WFL for tasks assessed/performed               Past Medical History:  Diagnosis Date   Anxiety    pt stated anxiety episodes resemble stroke symptoms   Chronic lower back pain    Conversion disorder    Depression    Diverticulitis    Expressive aphasia 11/15/2016   Fibromyalgia    GERD (gastroesophageal reflux disease)    Heart murmur    High cholesterol    History of gastric ulcer    History of hiatal hernia    History of kidney stones    History of thyroid  nodule    Hyperlipidemia    Hypertension    IBS (irritable bowel syndrome)    Intermittent vertigo    Migraine    "a few times/year now; maybe" (11/15/2016)   Obesity    OSA on CPAP    wears CPAP nightly ;study in epic 02-01-2014 (11/15/2016)   Osteoarthritis    "knees, hands, back, hips" (11/15/2016)   Pneumonia X 1   hx of   Psychogenic tremor    intermittant head tremor-(11/15/2016)   Refusal of blood transfusions as patient is Jehovah's Witness    RLS (restless legs syndrome)    Sleep apnea    CPAP   Urinary incontinence    Vertigo    Past Surgical History:  Procedure Laterality Date   ABDOMINAL HYSTERECTOMY  1990   Partial, still has ovaries   ANTERIOR CERVICAL DECOMP/DISCECTOMY FUSION  02/10/2004   C4 -- C7   BACK SURGERY     CARDIOVASCULAR STRESS TEST  2017   Negative   CESAREAN SECTION  1977; 1982   COLONOSCOPY     CYSTOSCOPY W/ RETROGRADES Right 06/07/2015   Procedure: CYSTOSCOPY WITH RETROGRADE PYELOGRAM;  Surgeon:  Andrez Banker, MD;  Location: [facility];  Service: Urology;  Laterality: Right;   CYSTOSCOPY WITH URETEROSCOPY AND STENT PLACEMENT Right 06/07/2015   Procedure: RIGHT URETEROSCOPY, LASER LITHOTRIPSY, STONE REMOVAL AND RIGHT URETERAL STENT PLACEMENT;  Surgeon: Andrez Banker, MD;  Location: [facility];  Service: Urology;  Laterality: Right;   DIRECT LARYNGOSCOPY N/A 10/29/2021   Procedure: DIRECT LARYNGOSCOPY WITH BIOPSY;  Surgeon: Virgina Grills, MD;  Location: [facility] OR;  Service: ENT;  Laterality: N/A;   HERNIA REPAIR     HOLMIUM LASER APPLICATION N/A 06/07/2015   Procedure: HOLMIUM LASER APPLICATION;  Surgeon: Andrez Banker, MD;  Location: [facility];  Service: Urology;  Laterality: N/A;   LAPAROSCOPIC CHOLECYSTECTOMY  10/07/2000   LIPOMA EXCISION  01/2017   from neck   UMBILICAL HERNIA REPAIR  age 49   UPPER GI ENDOSCOPY     Patient Active Problem List   Diagnosis Date Noted   OAB (overactive bladder) 01/02/2024   SUI (stress urinary incontinence, female) 01/02/2024   Precordial pain 12/07/2021   Chest pain 12/07/2021   Prediabetes 12/07/2021   Preprocedural  examination 12/07/2021   Hyperlipidemia 12/07/2021   Palpitations 12/07/2021   Physical deconditioning 08/22/2021   History of COVID-19 01/04/2021   Adult onset stuttering 06/30/2020   Migraine 06/30/2020   Fibromyalgia 06/30/2020   Type 2 diabetes mellitus without complication (HCC) 06/30/2020   TIA (transient ischemic attack) 06/30/2020   Atypical chest pain 08/13/2018   Depression, major 03/17/2018   Chronic pain syndrome 03/17/2018   Expressive aphasia 11/15/2016   Chronic cough 11/08/2015   B12 deficiency 12/07/2014   Wart viral 11/07/2014   Coarse tremors 02/09/2014   OSA on CPAP 01/28/2014   Functional neurological symptom disorder (conversion disorder), with abnormal movement 01/14/2014   Dyspnea 01/12/2014   Tremor of face and hands 01/12/2014    Breast pain in female 02/09/2013   Elevated WBC count 10/12/2012   Abdominal pain, other specified site 10/09/2012   Abdominal bloating 10/09/2012   Ovarian cystic mass 05/25/2012   Shoulder pain, right 03/11/2012   Right arm pain 12/30/2011   Acute sinusitis 07/30/2010   ACNE, ROSACEA 07/17/2010   LEG PAIN 07/17/2010   NECK PAIN 05/10/2010   ABDOMINAL PAIN, EPIGASTRIC 05/10/2010   BURSITIS, LEFT HIP 05/01/2010   PLANTAR FASCIITIS, BILATERAL 05/01/2010   HYPERGLYCEMIA 01/05/2010   PRURITUS 11/27/2009   TOBACCO USE, QUIT 11/27/2009   Anxiety state 11/22/2008   FATIGUE 11/22/2008   Essential hypertension 10/07/2008   Insomnia, unspecified 08/30/2008   Obesity 08/19/2008   NAUSEA ALONE 05/02/2008   VERTIGO 01/19/2008   DIARRHEA 01/19/2008   THYROID  NODULE 06/23/2007   Dyslipidemia 06/23/2007   Situational depression 06/23/2007   ADD 06/23/2007   TMJ SYNDROME 06/23/2007   GERD 06/23/2007   Irritable bowel syndrome 06/23/2007   Headache 06/23/2007    PCP: Rickford Charnley MD  REFERRING PROVIDER:   Rance Burrows, MD    REFERRING DIAG: [phone] (ICD-10-CM) - Pain in left knee   THERAPY DIAG:  Chronic pain of left knee  Muscle weakness (generalized)  Difficulty in walking, not elsewhere classified  Rationale for Evaluation and Treatment: Rehabilitation  ONSET DATE: 1 year  SUBJECTIVE:   SUBJECTIVE STATEMENT: Pt reports a little relief with tape application. Left it on until last night. No issue removing tape. She reports she went to [employer] on Saturday, "a lot of walking and stairs, it was rough".  Pt reports that she stayed in bed most of day Sunday and Monday. She has appt with Dr. Debborah Fairly today, "I'm ready to go ahead and have surgery." Pt reports she is not interested in TKR at this time.     POOL ACCESS: has silver sneakers, can go to [facility]   Initial subjective Pt reports knee pain for the past year with increasing difficulty walking.  Also has chronic  LBP.  She reports she has not been exercising in the past few months.  She is hoping to decrease her pain and walk better  PERTINENT HISTORY: Conversion disorder: looks like stroke more frequent. Triggers are eye movements, certain words. Have not been having many recently PAIN:  Are you having pain? Yes: NPRS scale: current 4/10 when standing still, up to 8/10 when walking Pain location: L knee Pain description: sharp achy, stabbing Aggravating factors: walking 6-7 minutes; standing Relieving factors: rest   PRECAUTIONS: None  RED FLAGS: None   WEIGHT BEARING RESTRICTIONS: no  FALLS:  Has patient fallen in last 6 months? No  LIVING ENVIRONMENT: Lives with: lives with their family and lives with their spouse Lives in: House/apartment Stairs: Yes: External: 1  steps; none Has following equipment at home: None  OCCUPATION: retired  PLOF: Independent  PATIENT GOALS: more mobility, less pain  NEXT MD VISIT: as needed  OBJECTIVE:  Note: Objective measures were completed at Evaluation unless otherwise noted.  DIAGNOSTIC FINDINGS: none in chart  PATIENT SURVEYS:  LEFS 27/80  COGNITION: Overall cognitive status: Within functional limits for tasks assessed     SENSATION: WFL  EDEMA:  Mild edema about joint line left knee  PALPATION: Moderate TTP about left knee medial joint line  LOWER EXTREMITY ROM:  Active ROM Right eval Left eval  Hip flexion    Hip extension    Hip abduction    Hip adduction    Hip internal rotation    Hip external rotation    Knee flexion 120 95  Knee extension 0 6  Ankle dorsiflexion    Ankle plantarflexion    Ankle inversion    Ankle eversion     (Blank rows = not tested)  LOWER EXTREMITY MMT:  MMT Right eval Left eval  Hip flexion 31.2 33.1  Hip extension    Hip abduction 36.3 21.4  Hip adduction    Hip internal rotation    Hip external rotation    Knee flexion 4 3+ P!  Knee extension 4 3+ P!  Ankle dorsiflexion     Ankle plantarflexion    Ankle inversion    Ankle eversion     (Blank rows = not tested)    FUNCTIONAL TESTS:  Not tolerated.  Will test as tolerated. (Had conversion reaction)  GAIT: Distance walked: 400 ft Assistive device utilized: no ad today although suggested to improve gait to use cane to setting Level of assistance: Modified independence Comments: antalgic gait left knee, decreased step length left knee and hip flex during swing                                                                                                                                TREATMENT  [facility] Adult PT Treatment:                                                DATE: 05/04/24 Pt seen for aquatic therapy today.  Treatment took place in water 3.5-4.75 ft in depth at the [facility] pool. Temp of water was 91.  Pt entered/exited the pool via stairs using step to pattern with hand rail.  *unsupported: walking forward, backwards  * unsupported: side stepping -> with arm addct/ abdct with rainbow hand floats (limited tolerance)-> without floats (tolerated) *Suitcase carry with single Yellow hand float  walking forward/backward  *UE support on wall: toe raises /heel raises x 10; hip add/abd x (10 painful in L stance); alternating hamstring curls * return to walking backwards  *seated on bench in water: cycling; hip  add/abd; LAQ ;  STS from bench in the water, feet on step x 10 * TrA set with short hollow noodle pull down to thighs x 10 in wide stance then staggered stance  Pt requires the buoyancy and hydrostatic pressure of water for support, and to offload joints by unweighting joint load by at least 50 % in navel deep water and by at least 75-80% in chest to neck deep water.  Viscosity of the water is needed for resistance of strengthening. Water current perturbations provides challenge to standing balance requiring increased core activation.     PATIENT EDUCATION:  Education details:  reacquainting to aquatic therapy Person educated: Patient Education method: Explanation Education comprehension: verbalized understanding  HOME EXERCISE PROGRAM: tba  ASSESSMENT:  CLINICAL IMPRESSION: Positive response to kinesiology tape applied to L knee. Will reapply next visit, as pt has appt with dr regarding knee today.  She reported increased pain in L knee when in L SLS for R hip abdct at wall; gradually eased with change in exercise. Plan to create HEP for pt to take to [facility] outside of therapy sessions. Pt has made STG 1 and 2.    Of Note: when pt completes activity she is hesitant with she needs extra time to combat/control her anxiety response. If pause is too long discontinue activity. (It is evident in her facial expression).     Initial Impression Patient is a 63 y.o. f who was seen today for physical therapy evaluation and treatment for left knee pain. Pt well known to this clinic as she has been seen in the past for LBP. She presents today with left knee pain due to OA.  She demonstrates decreased strength and ROM of left knee complex as well as a gait deviation. Her pmhx include conversion disorder brought on by anxiety which tends to limit her toleration to therapy which she does experience today during evaluation. Eval cut short so pt able to manage. Pt is able to identify when symptoms are beginning and can alter the progression with deep breathing. This therapist is familiar with reaction as pt has had them in previous episodes.  She is a good candidate for aquatic therapy and will benefit from skilled intervention to improve deficits of decreased mobility and completion of ADL's and functional mobility due to left knee pain.  Will plan on completing functional testing next session.  OBJECTIVE IMPAIRMENTS: Abnormal gait, decreased activity tolerance, decreased balance, decreased knowledge of use of DME, decreased mobility, difficulty walking, decreased ROM, and pain.    ACTIVITY LIMITATIONS: bending, standing, squatting, stairs, transfers, bed mobility, and locomotion level  PARTICIPATION LIMITATIONS: cleaning, shopping, community activity, and yard work  PERSONAL FACTORS: Past/current experiences, Time since onset of injury/illness/exacerbation, and 3+ comorbidities: fibromyalgia, conversion disorder, chronic lbp are also affecting patient's functional outcome.   REHAB POTENTIAL: Good  CLINICAL DECISION MAKING: Evolving/moderate complexity  EVALUATION COMPLEXITY: Moderate   GOALS: Goals reviewed with patient? Yes  SHORT TERM GOALS: Target date: 04/20/24 Pt will tolerate full aquatic sessions consistently without increase in pain and with improving function to demonstrate good toleration and effectiveness of intervention.  Baseline: Goal status: In Progress - 05/04/24  2.  Pt will tolerate walking to and from setting (400-500 ft ea way) using AD as needed Baseline:  Goal status: In progress - 04/30/24    LONG TERM GOALS: Target date: 05/28/24  Pt to improve on LEFS by at least 9 point to demonstrate statistically significant Improvement in function. Baseline: 27/80 Goal status:  INITIAL  2.  Pt will improve left knee flex to 105d and extension to -3d Baseline: see chart Goal status: INITIAL  3.  Pt will improve left knee strength by 1 full grade or >. Will potentially measure in lbs as tolerated. Baseline: see chart Goal status: INITIAL  4.  Pt will demonstrate and improved gait pattern  Baseline:  Goal status: INITIAL  5.  Pt will report decrease in pain by at least 50% for improved toleration to activity/quality of life and to demonstrate improved management of pain. Baseline:  Goal status: INITIAL  6.  Pt will be indep with final HEP's (land and aquatic as appropriate) for continued management of condition Baseline:  Goal status: INITIAL   PLAN:  PT FREQUENCY: 2x/week  PT DURATION: 8 weeks anticipate 12 visits.  Extended  time due to scheduling conflicts  PLANNED INTERVENTIONS: 97164- PT Re-evaluation, 97110-Therapeutic exercises, 97530- Therapeutic activity, 97112- Neuromuscular re-education, [phone]- Self Care, 21308- Manual therapy, [phone]- Gait training, [phone]- Electrical stimulation (unattended), [phone]- Ionotophoresis 4mg /ml Dexamethasone , Patient/Family education, Balance training, Stair training, Taping, Dry Needling, Joint mobilization, DME instructions, Cryotherapy, and Moist heat  PLAN FOR NEXT SESSION: aquatics for strength, ROM/stretching lle, balance and proprioception retraining, stair climbing, pain reduciton and management  Almedia Jacobsen, PTA 05/04/24 11:14 AM [facility] [address] [place], [place], [address] Phone: [phone]   Fax:  [phone]    Referring diagnosis? Left knee epain Treatment diagnosis? (if different than referring diagnosis) no What was this (referring dx) caused by? []  Surgery []  Fall [x]  Ongoing issue [x]  Arthritis []  Other: ____________  Laterality: []  Rt [x]  Lt []  Both  Check all possible CPT codes:  *CHOOSE 10 OR LESS*    See Planned Interventions listed in the Plan section of the Evaluation.

## 2024-05-07 ENCOUNTER — Ambulatory Visit (HOSPITAL_BASED_OUTPATIENT_CLINIC_OR_DEPARTMENT_OTHER): Admitting: Physical Therapy

## 2024-05-07 DIAGNOSIS — R262 Difficulty in walking, not elsewhere classified: Secondary | ICD-10-CM | POA: Diagnosis not present

## 2024-05-07 DIAGNOSIS — M25562 Pain in left knee: Secondary | ICD-10-CM | POA: Diagnosis not present

## 2024-05-07 DIAGNOSIS — G8929 Other chronic pain: Secondary | ICD-10-CM

## 2024-05-07 DIAGNOSIS — M6281 Muscle weakness (generalized): Secondary | ICD-10-CM | POA: Diagnosis not present

## 2024-05-07 NOTE — Therapy (Addendum)
 OUTPATIENT PHYSICAL THERAPY LOWER EXTREMITY TREATMENT PHYSICAL THERAPY DISCHARGE SUMMARY  Visits from Start of Care: 5  Current functional level related to goals / functional outcomes: Indep with AD's   Remaining deficits: Chronic knee pain   Education / Equipment: Management of condition; HEP from previous episode  Patient agrees to discharge. Patient goals were partially met. Patient is being discharged due to the patient's request.  Addend Ronal Kem) Ziemba MPT 06/30/24 5:26 PM [facility] [address] [place], [place], [address] Phone: [phone]   Fax:  [phone]     Patient Name: Paula [name] MRN: [id number] DOB:10-18-1961, 63 y.o., female Today's Date: 05/07/2024  END OF SESSION:    PT End of Session - 05/07/24 1715     Visit Number 5    Date for PT Re-Evaluation 05/28/24    Authorization Type [employer]    PT Start Time 0932    PT Stop Time 1010    PT Time Calculation (min) 38 min    Activity Tolerance Patient tolerated treatment well    Behavior During Therapy [facility] for tasks assessed/performed                Past Medical History:  Diagnosis Date   Anxiety    pt stated anxiety episodes resemble stroke symptoms   Chronic lower back pain    Conversion disorder    Depression    Diverticulitis    Expressive aphasia 11/15/2016   Fibromyalgia    GERD (gastroesophageal reflux disease)    Heart murmur    High cholesterol    History of gastric ulcer    History of hiatal hernia    History of kidney stones    History of thyroid  nodule    Hyperlipidemia    Hypertension    IBS (irritable bowel syndrome)    Intermittent vertigo    Migraine    a few times/year now; maybe (11/15/2016)   Obesity    OSA on CPAP    wears CPAP nightly ;study in epic 02-01-2014 (11/15/2016)   Osteoarthritis    knees, hands, back, hips (11/15/2016)   Pneumonia X 1   hx of   Psychogenic tremor     intermittant head tremor-(11/15/2016)   Refusal of blood transfusions as patient is Jehovah's Witness    RLS (restless legs syndrome)    Sleep apnea    CPAP   Urinary incontinence    Vertigo    Past Surgical History:  Procedure Laterality Date   ABDOMINAL HYSTERECTOMY  1990   Partial, still has ovaries   ANTERIOR CERVICAL DECOMP/DISCECTOMY FUSION  02/10/2004   C4 -- C7   BACK SURGERY     CARDIOVASCULAR STRESS TEST  2017   Negative   CESAREAN SECTION  1977; 1982   COLONOSCOPY     CYSTOSCOPY W/ RETROGRADES Right 06/07/2015   Procedure: CYSTOSCOPY WITH RETROGRADE PYELOGRAM;  Surgeon: Morene LELON Salines, MD;  Location: [facility];  Service: Urology;  Laterality: Right;   CYSTOSCOPY WITH URETEROSCOPY AND STENT PLACEMENT Right 06/07/2015   Procedure: RIGHT URETEROSCOPY, LASER LITHOTRIPSY, STONE REMOVAL AND RIGHT URETERAL STENT PLACEMENT;  Surgeon: Morene LELON Salines, MD;  Location: [facility];  Service: Urology;  Laterality: Right;   DIRECT LARYNGOSCOPY N/A 10/29/2021   Procedure: DIRECT LARYNGOSCOPY WITH BIOPSY;  Surgeon: Carlie Clark, MD;  Location: [facility];  Service: ENT;  Laterality: N/A;   HERNIA REPAIR     HOLMIUM LASER APPLICATION N/A 06/07/2015   Procedure:  HOLMIUM LASER APPLICATION;  Surgeon: Morene LELON Salines, MD;  Location: [facility];  Service: Urology;  Laterality: N/A;   LAPAROSCOPIC CHOLECYSTECTOMY  10/07/2000   LIPOMA EXCISION  01/2017   from neck   UMBILICAL HERNIA REPAIR  age 63   UPPER GI ENDOSCOPY     Patient Active Problem List   Diagnosis Date Noted   OAB (overactive bladder) 01/02/2024   SUI (stress urinary incontinence, female) 01/02/2024   Precordial pain 12/07/2021   Chest pain 12/07/2021   Prediabetes 12/07/2021   Preprocedural examination 12/07/2021   Hyperlipidemia 12/07/2021   Palpitations 12/07/2021   Physical deconditioning 08/22/2021   History of COVID-19 01/04/2021   Adult onset stuttering  06/30/2020   Migraine 06/30/2020   Fibromyalgia 06/30/2020   Type 2 diabetes mellitus without complication (HCC) 06/30/2020   TIA (transient ischemic attack) 06/30/2020   Atypical chest pain 08/13/2018   Depression, major 03/17/2018   Chronic pain syndrome 03/17/2018   Expressive aphasia 11/15/2016   Chronic cough 11/08/2015   B12 deficiency 12/07/2014   Wart viral 11/07/2014   Coarse tremors 02/09/2014   OSA on CPAP 01/28/2014   Functional neurological symptom disorder (conversion disorder), with abnormal movement 01/14/2014   Dyspnea 01/12/2014   Tremor of face and hands 01/12/2014   Breast pain in female 02/09/2013   Elevated WBC count 10/12/2012   Abdominal pain, other specified site 10/09/2012   Abdominal bloating 10/09/2012   Ovarian cystic mass 05/25/2012   Shoulder pain, right 03/11/2012   Right arm pain 12/30/2011   Acute sinusitis 07/30/2010   ACNE, ROSACEA 07/17/2010   LEG PAIN 07/17/2010   NECK PAIN 05/10/2010   ABDOMINAL PAIN, EPIGASTRIC 05/10/2010   BURSITIS, LEFT HIP 05/01/2010   PLANTAR FASCIITIS, BILATERAL 05/01/2010   HYPERGLYCEMIA 01/05/2010   PRURITUS 11/27/2009   TOBACCO USE, QUIT 11/27/2009   Anxiety state 11/22/2008   FATIGUE 11/22/2008   Essential hypertension 10/07/2008   Insomnia, unspecified 08/30/2008   Obesity 08/19/2008   NAUSEA ALONE 05/02/2008   VERTIGO 01/19/2008   DIARRHEA 01/19/2008   THYROID  NODULE 06/23/2007   Dyslipidemia 06/23/2007   Situational depression 06/23/2007   ADD 06/23/2007   TMJ SYNDROME 06/23/2007   GERD 06/23/2007   Irritable bowel syndrome 06/23/2007   Headache 06/23/2007    PCP: Nicholaus Rav MD  REFERRING PROVIDER:   Dasie Fitch, MD    REFERRING DIAG: [phone] (ICD-10-CM) - Pain in left knee   THERAPY DIAG:  Chronic pain of left knee  Muscle weakness (generalized)  Difficulty in walking, not elsewhere classified  Rationale for Evaluation and Treatment: Rehabilitation  ONSET DATE: 1  year  SUBJECTIVE:   SUBJECTIVE STATEMENT: Pt reports she received injection in L knee on Wednesday. Had relief for 2 days.  Pain has started to return.   POOL ACCESS: has silver sneakers, can go to [facility]   Initial subjective Pt reports knee pain for the past year with increasing difficulty walking.  Also has chronic LBP.  She reports she has not been exercising in the past few months.  She is hoping to decrease her pain and walk better  PERTINENT HISTORY: Conversion disorder: looks like stroke more frequent. Triggers are eye movements, certain words. Have not been having many recently PAIN:  Are you having pain? Yes: NPRS scale: current 4/10 when standing still, up to 8/10 when walking Pain location: L knee Pain description: sharp achy, stabbing Aggravating factors: walking 6-7 minutes; standing Relieving factors: rest   PRECAUTIONS: None  RED FLAGS: None  WEIGHT BEARING RESTRICTIONS: no  FALLS:  Has patient fallen in last 6 months? No  LIVING ENVIRONMENT: Lives with: lives with their family and lives with their spouse Lives in: House/apartment Stairs: Yes: External: 1 steps; none Has following equipment at home: None  OCCUPATION: retired  PLOF: Independent  PATIENT GOALS: more mobility, less pain  NEXT MD VISIT: as needed  OBJECTIVE:  Note: Objective measures were completed at Evaluation unless otherwise noted.  DIAGNOSTIC FINDINGS: none in chart  PATIENT SURVEYS:  LEFS 27/80  COGNITION: Overall cognitive status: Within functional limits for tasks assessed     SENSATION: WFL  EDEMA:  Mild edema about joint line left knee  PALPATION: Moderate TTP about left knee medial joint line  LOWER EXTREMITY ROM:  Active ROM Right eval Left eval Left 05/07/24  Hip flexion     Hip extension     Hip abduction     Hip adduction     Hip internal rotation     Hip external rotation     Knee flexion 120 95 112 (seated edge of bench)  Knee extension 0 6    Ankle dorsiflexion     Ankle plantarflexion     Ankle inversion     Ankle eversion      (Blank rows = not tested)  LOWER EXTREMITY MMT:  MMT Right eval Left eval  Hip flexion 31.2 33.1  Hip extension    Hip abduction 36.3 21.4  Hip adduction    Hip internal rotation    Hip external rotation    Knee flexion 4 3+ P!  Knee extension 4 3+ P!  Ankle dorsiflexion    Ankle plantarflexion    Ankle inversion    Ankle eversion     (Blank rows = not tested)    FUNCTIONAL TESTS:  Not tolerated.  Will test as tolerated. (Had conversion reaction)  GAIT: Distance walked: 400 ft Assistive device utilized: no ad today although suggested to improve gait to use cane to setting Level of assistance: Modified independence Comments: antalgic gait left knee, decreased step length left knee and hip flex during swing                                                                                                                                TREATMENT  [facility] Adult PT Treatment:                                                DATE: 05/07/24 Pt seen for aquatic therapy today.  Treatment took place in water 3.5-4.75 ft in depth at the [facility] pool. Temp of water was 91.  Pt entered/exited the pool via stairs using step to pattern with hand rail.  *UE on barbell: walking forward, backwards - multiple laps * side stepping -> with  arm addct/ abdct with rainbow hand floats (limited tolerance)-> without floats (tolerated) *Suitcase carry with bilat rainbow hand float  walking forward/backward  *UE support on wall: toe raises /heel raises x 20; hip add/abd x 18, alternating LEs; hamstring curls x 10 alternating LEs; LE swings into hip flexion / ext 2 x 5 * return to walking backwards  *seated on bench in water: cycling; hip add/abd; LAQ ;  STS from bench in the water, feet on step x 10 * TrA set with short hollow noodle pull down to thighs x 10 in wide stance then staggered stance * once  dried off: applied reg Rock tape to medial left knee at joint line to decompress tissue and increase proprioception.  Pt requires the buoyancy and hydrostatic pressure of water for support, and to offload joints by unweighting joint load by at least 50 % in navel deep water and by at least 75-80% in chest to neck deep water.  Viscosity of the water is needed for resistance of strengthening. Water current perturbations provides challenge to standing balance requiring increased core activation.     PATIENT EDUCATION:  Education details: reacquainting to aquatic therapy Person educated: Patient Education method: Explanation Education comprehension: verbalized understanding  HOME EXERCISE PROGRAM: tba  ASSESSMENT:  CLINICAL IMPRESSION: Less pain in L knee since injection 3 days ago.  Increased pain in L knee when in L SLS in previous sessions, so completed alternating LE exercises to avoid too long in stance. Pt had increased anxiety with attempts at tandem gait (with UE support on floats and with wall); anxiety eased with change in exercise. Plan to create HEP for pt to take to [facility] outside of therapy sessions. Pt has partially met her ROM goal.     Of Note: when pt completes activity she is hesitant with she needs extra time to combat/control her anxiety response. If pause is too long discontinue activity. (It is evident in her facial expression).     Initial Impression Patient is a 63 y.o. f who was seen today for physical therapy evaluation and treatment for left knee pain. Pt well known to this clinic as she has been seen in the past for LBP. She presents today with left knee pain due to OA.  She demonstrates decreased strength and ROM of left knee complex as well as a gait deviation. Her pmhx include conversion disorder brought on by anxiety which tends to limit her toleration to therapy which she does experience today during evaluation. Eval cut short so pt able to manage. Pt is able to  identify when symptoms are beginning and can alter the progression with deep breathing. This therapist is familiar with reaction as pt has had them in previous episodes.  She is a good candidate for aquatic therapy and will benefit from skilled intervention to improve deficits of decreased mobility and completion of ADL's and functional mobility due to left knee pain.  Will plan on completing functional testing next session.  OBJECTIVE IMPAIRMENTS: Abnormal gait, decreased activity tolerance, decreased balance, decreased knowledge of use of DME, decreased mobility, difficulty walking, decreased ROM, and pain.   ACTIVITY LIMITATIONS: bending, standing, squatting, stairs, transfers, bed mobility, and locomotion level  PARTICIPATION LIMITATIONS: cleaning, shopping, community activity, and yard work  PERSONAL FACTORS: Past/current experiences, Time since onset of injury/illness/exacerbation, and 3+ comorbidities: fibromyalgia, conversion disorder, chronic lbp are also affecting patient's functional outcome.   REHAB POTENTIAL: Good  CLINICAL DECISION MAKING: Evolving/moderate complexity  EVALUATION COMPLEXITY: Moderate   GOALS:  Goals reviewed with patient? Yes  SHORT TERM GOALS: Target date: 04/20/24 Pt will tolerate full aquatic sessions consistently without increase in pain and with improving function to demonstrate good toleration and effectiveness of intervention.  Baseline: Goal status: MET - 05/04/24  2.  Pt will tolerate walking to and from setting (400-500 ft ea way) using AD as needed Baseline:  Goal status: MET - 05/04/24    LONG TERM GOALS: Target date: 05/28/24  Pt to improve on LEFS by at least 9 point to demonstrate statistically significant Improvement in function. Baseline: 27/80 Goal status: INITIAL  2.  Pt will improve left knee flex to 105d and extension to -3d Baseline: see chart Goal status: Partially met -05/07/24  3.  Pt will improve left knee strength by 1 full  grade or >. Will potentially measure in lbs as tolerated. Baseline: see chart Goal status: INITIAL  4.  Pt will demonstrate and improved gait pattern  Baseline:  Goal status: INITIAL  5.  Pt will report decrease in pain by at least 50% for improved toleration to activity/quality of life and to demonstrate improved management of pain. Baseline:  Goal status: INITIAL  6.  Pt will be indep with final HEP's (land and aquatic as appropriate) for continued management of condition Baseline:  Goal status: INITIAL   PLAN:  PT FREQUENCY: 2x/week  PT DURATION: 8 weeks anticipate 12 visits.  Extended time due to scheduling conflicts  PLANNED INTERVENTIONS: 97164- PT Re-evaluation, 97110-Therapeutic exercises, 97530- Therapeutic activity, 97112- Neuromuscular re-education, [phone]- Self Care, 02859- Manual therapy, [phone]- Gait training, [phone]- Electrical stimulation (unattended), [phone]- Ionotophoresis 4mg /ml Dexamethasone , Patient/Family education, Balance training, Stair training, Taping, Dry Needling, Joint mobilization, DME instructions, Cryotherapy, and Moist heat  PLAN FOR NEXT SESSION: aquatics for strength, ROM/stretching lle, balance and proprioception retraining, stair climbing, pain reduciton and management  Delon Aquas, PTA 05/07/24 5:17 PM [facility] [address] [place], [place], [address] Phone: [phone]   Fax:  [phone]     Referring diagnosis? Left knee epain Treatment diagnosis? (if different than referring diagnosis) no What was this (referring dx) caused by? []  Surgery []  Fall [x]  Ongoing issue [x]  Arthritis []  Other: ____________  Laterality: []  Rt [x]  Lt []  Both  Check all possible CPT codes:  *CHOOSE 10 OR LESS*    See Planned Interventions listed in the Plan section of the Evaluation.

## 2024-05-11 ENCOUNTER — Ambulatory Visit (HOSPITAL_BASED_OUTPATIENT_CLINIC_OR_DEPARTMENT_OTHER): Admitting: Physical Therapy

## 2024-05-11 DIAGNOSIS — K219 Gastro-esophageal reflux disease without esophagitis: Secondary | ICD-10-CM | POA: Diagnosis not present

## 2024-05-11 DIAGNOSIS — Z6841 Body Mass Index (BMI) 40.0 and over, adult: Secondary | ICD-10-CM | POA: Diagnosis not present

## 2024-05-11 DIAGNOSIS — E66813 Obesity, class 3: Secondary | ICD-10-CM | POA: Diagnosis not present

## 2024-05-11 DIAGNOSIS — R140 Abdominal distension (gaseous): Secondary | ICD-10-CM | POA: Diagnosis not present

## 2024-05-14 ENCOUNTER — Ambulatory Visit (HOSPITAL_BASED_OUTPATIENT_CLINIC_OR_DEPARTMENT_OTHER): Admitting: Physical Therapy

## 2024-05-18 ENCOUNTER — Ambulatory Visit (HOSPITAL_BASED_OUTPATIENT_CLINIC_OR_DEPARTMENT_OTHER): Admitting: Physical Therapy

## 2024-05-19 ENCOUNTER — Encounter (HOSPITAL_BASED_OUTPATIENT_CLINIC_OR_DEPARTMENT_OTHER): Payer: Self-pay

## 2024-05-19 NOTE — Progress Notes (Unsigned)
 error

## 2024-05-20 ENCOUNTER — Encounter: Payer: Self-pay | Admitting: Nurse Practitioner

## 2024-05-20 ENCOUNTER — Ambulatory Visit: Attending: Cardiovascular Disease | Admitting: Nurse Practitioner

## 2024-05-20 ENCOUNTER — Ambulatory Visit: Admitting: Nurse Practitioner

## 2024-05-20 VITALS — BP 165/84 | HR 62 | Ht 64.0 in | Wt 257.0 lb

## 2024-05-20 DIAGNOSIS — R079 Chest pain, unspecified: Secondary | ICD-10-CM

## 2024-05-20 DIAGNOSIS — I10 Essential (primary) hypertension: Secondary | ICD-10-CM

## 2024-05-20 DIAGNOSIS — Z01810 Encounter for preprocedural cardiovascular examination: Secondary | ICD-10-CM

## 2024-05-20 DIAGNOSIS — E7849 Other hyperlipidemia: Secondary | ICD-10-CM

## 2024-05-20 DIAGNOSIS — R002 Palpitations: Secondary | ICD-10-CM

## 2024-05-20 MED ORDER — ASPIRIN 81 MG PO TBEC: 81.0000 mg | DELAYED_RELEASE_TABLET | Freq: Every day | ORAL | Status: AC

## 2024-05-20 MED ORDER — REPATHA SURECLICK 140 MG/ML ~~LOC~~ SOAJ: 140.0000 mg | SUBCUTANEOUS | 0 refills | Status: DC

## 2024-05-20 NOTE — Progress Notes (Addendum)
 Cardiology Office Note    Patient Name: Paula [name] Date of Encounter: 05/20/2024  Primary Care Provider:  Seabron Lenis, MD Primary Cardiologist:  Dub Huntsman, DO Primary Electrophysiologist: None   Past Medical History    Past Medical History:  Diagnosis Date   Anxiety    pt stated anxiety episodes resemble stroke symptoms   Chronic lower back pain    Conversion disorder    Depression    Diverticulitis    Expressive aphasia 11/15/2016   Fibromyalgia    GERD (gastroesophageal reflux disease)    Heart murmur    High cholesterol    History of gastric ulcer    History of hiatal hernia    History of kidney stones    History of thyroid  nodule    Hyperlipidemia    Hypertension    IBS (irritable bowel syndrome)    Intermittent vertigo    Migraine    a few times/year now; maybe (11/15/2016)   Obesity    OSA on CPAP    wears CPAP nightly ;study in epic 02-01-2014 (11/15/2016)   Osteoarthritis    knees, hands, back, hips (11/15/2016)   Pneumonia X 1   hx of   Psychogenic tremor    intermittant head tremor-(11/15/2016)   Refusal of blood transfusions as patient is Jehovah's Witness    RLS (restless legs syndrome)    Sleep apnea    CPAP   Urinary incontinence    Vertigo     History of Present Illness  63 y.o. y/o female with a h/o OSA (on CPAP), HTN, GERD, TIA HLD, anxiety, obesity, palpitations who presents today for preoperative clearance.   [name] was seen initially in 2015 for complaint of dyspnea and palpitations.  She did not had any syncope but endorsed occasional lightheadedness and reported compliance with her CPAP.  She was ordered an exercise tolerance test however had poor exercise tolerance was not able to exercise to complete test.  She completed a Lexiscan  Myoview  due to complaint of occasional stabbing chest pain that was negative.  She was seen back in our office in 2017 with complaint of chest pain that she is described as sharp stabbing pain  occurring at rest.  She also endorsed ongoing palpitations and wore a 30-day event monitor that showed rare atrial tachycardia and chest pain felt to be atypical.  She reestablish care and was seen by Dr. Huntsman on 12/07/2021 with complaint of intermittent chest pain with shortness of breath and fatigue.  She underwent a coronary CTA that showed minimal nonobstructive disease with calcium  score 69 and recommendation of low intensity statin.  She has an intolerance to statins and was seen by Dr. Mona on 12/24/2021 with recommendation to start PCSK9 inhibitor and was started on Repatha .  She continued to have elevated LDLs and was started on ezetimibe  and was last seen by Dr. Mona on 06/23/2023.  During visit she reported worsening palpitations and had a 2-week ZIO placed that showed 9 episodes of PSVT with no atrial fibrillation with recommendation to switch from amlodipine  to Cardizem .  She was contacted for preoperative clearance visit however patient reported increased palpitations with decision to defer clearance to an office visit.  [name] presents today for preoperative clearance visit.  She reports experiencing ongoing palpitations and occasional stabbing chest pain with no clear trigger.Her heart rate has reached 165 bpm during episodes, notably while cleaning her house, as confirmed by her watch. She associates these episodes with stress but notes they can  occur without any apparent cause. No radiation of chest pain to the arm or neck. Occasional stabbing chest pain and palpitations are reported. In 2023, she underwent a coronary CT scan which showed some calcium  but no significant blockages. She was initially started on a statin but experienced joint aches in her neck, shoulders, knees, and feet. She was later prescribed Repatha  injections but has not taken them for over six months due to concerns about long-term effects, despite not experiencing side effects while on it. Her cholesterol levels are  elevated with LDL at 164 mg/dL, total cholesterol at 751 mg/dL, and triglycerides at 819 mg/dL. She has a history of a normal stress test in 2015 and wore a heart monitor in 2024 for two weeks, which recorded one episode of increased heart rate. She was prescribed Cardizem , but it was never filled due to a pharmacy mix-up. She has attempted weight loss with Ozempic, starting at 0.25 mg and increasing to 0.5 mg, but did not find it effective in curbing her appetite or aiding weight loss. She has a history of transient ischemic attacks (TIAs), which have contributed to her being on disability. During these episodes, she experiences symptoms such as facial droop, slurred speech, and tremors. Her current medications include Repatha  injections, which she takes every other Wednesday, although she has not taken them this year. She also takes baby aspirin  occasionally for chest pain relief. She quit smoking in 2010 after smoking up to three cigarettes a day for about 20 years. She is concerned about her heart health and has an upcoming surgery planned that will require general anesthesia. Patient denies dyspnea, PND, orthopnea, nausea, vomiting, dizziness, syncope, edema, weight gain, or early satiety.   Discussed the use of AI scribe software for clinical note transcription with the patient, who gave verbal consent to proceed.  History of Present Illness   Review of Systems  Please see the history of present illness.    All other systems reviewed and are otherwise negative except as noted above.  Physical Exam    Wt Readings from Last 3 Encounters:  05/20/24 257 lb (116.6 kg)  03/11/24 261 lb 6.4 oz (118.6 kg)  03/03/24 261 lb (118.4 kg)   VS: Vitals:   05/20/24 1418  BP: (!) 152/72  Pulse: 62  SpO2: 97%  ,Body mass index is 44.11 kg/m. GEN: Well nourished, well developed in no acute distress Neck: No JVD; No carotid bruits Pulmonary: Clear to auscultation without rales, wheezing or rhonchi   Cardiovascular: Normal rate. Regular rhythm. Normal S1. Normal S2.  Positive for occasional intermittent chest pain Murmurs: There is no murmur.  ABDOMEN: Soft, non-tender, non-distended EXTREMITIES:  No edema; No deformity   EKG/LABS/ Recent Cardiac Studies   ECG personally reviewed by me today -sinus rhythm with rate of 62 bpm and no acute changes consistent with previous EKG.  Risk Assessment/Calculations:          Lab Results  Component Value Date   WBC 9.2 11/10/2023   HGB 13.1 11/10/2023   HCT 40.9 11/10/2023   MCV 89.9 11/10/2023   PLT 326 11/10/2023   Lab Results  Component Value Date   CREATININE 0.79 11/10/2023   BUN 10 11/10/2023   NA 140 11/10/2023   K 4.3 11/10/2023   CL 105 11/10/2023   CO2 27 11/10/2023   Lab Results  Component Value Date   CHOL 281 (H) 07/01/2020   HDL 44 07/01/2020   LDLCALC 202 (H) 07/01/2020   LDLDIRECT 170.1  01/08/2010   TRIG 175 (H) 07/01/2020   CHOLHDL 6.4 07/01/2020    Lab Results  Component Value Date   HGBA1C 5.3 07/01/2020   Assessment & Plan    Assessment & Plan  1.  Preoperative clearance - Patient's RCRI score is 6.6% -Patient able to complete 7 METS of activity - Lexiscan  Myoview  completed with low risk study and no evidence of ischemia.  Patient can proceed with scheduled procedure without further cardiac testing.  2.  Palpitations: Intermittent palpitations with increased heart rate, normal sinus rhythm with occasional ectopy. Stress and caffeine may contribute. No current arrhythmia detected. - Order stress test to evaluate heart function under stress. - Consider Cardizem  if episodes become more frequent. - Advise monitoring caffeine and alcohol intake. - Encourage stress management techniques.  3.Chest pain: Intermittent stabbing chest pain, non-cardiac origin suggested. Previous coronary CT showed calcium , no significant blockages. Pain relieved by baby aspirin . - Order Lexiscan  stress test to evaluate  cardiac function under stress. - Start baby aspirin  81 mg daily.  4.History of TIA: TIA history increases surgical risk. Functional status good, but moderate to high surgical risk due to TIA and anesthesia requirements. - Proceed with stress test to assess cardiac clearance for surgery. - Discuss surgical risks and ensure clearance post-stress test.  5. Hyperlipidemia: Elevated cholesterol, LDL at 164 mg/dL. Intolerant to statins, Repatha  effective but previously discontinued. - Restart Repatha  injections every 14 days. - Recheck cholesterol levels in 8 weeks. - Schedule follow-up with Doctor Hilty in 3 months.  Disposition: Follow-up with Kardie Tobb, DO or APP in 3 months Informed Consent   Shared Decision Making/Informed Consent The risks [chest pain, shortness of breath, cardiac arrhythmias, dizziness, blood pressure fluctuations, myocardial infarction, stroke/transient ischemic attack, nausea, vomiting, allergic reaction, radiation exposure, metallic taste sensation and life-threatening complications (estimated to be 1 in 10,000)], benefits (risk stratification, diagnosing coronary artery disease, treatment guidance) and alternatives of a nuclear stress test were discussed in detail with Ms. Waddell and she agrees to proceed.      Signed, Wyn Raddle, Jackee Shove, NP 05/20/2024, 2:41 PM [facility]

## 2024-05-20 NOTE — Patient Instructions (Signed)
 Medication Instructions:  START Aspirin  81mg  Take 1 tablet once a day  RESTART Repatha   *If you need a refill on your cardiac medications before your next appointment, please call your pharmacy*  Lab Work: 8 WEEKS FASTING LIPIDS & LFTS If you have labs (blood work) drawn today and your tests are completely normal, you will receive your results only by: MyChart Message (if you have MyChart) OR A paper copy in the mail If you have any lab test that is abnormal or we need to change your treatment, we will call you to review the results.  Testing/Procedures: Your physician has requested that you have a lexiscan  myoview . For further information please visit [email or web address] Please follow instruction sheet, as given.   Follow-Up: At [facility], you and your health needs are our priority.  As part of our continuing mission to provide you with exceptional heart care, our providers are all part of one team.  This team includes your primary Cardiologist (physician) and Advanced Practice Providers or APPs (Physician Assistants and Nurse Practitioners) who all work together to provide you with the care you need, when you need it.  Your next appointment:   12 month(s)  Provider:   Kardie Tobb, DO Your physician recommends that you schedule a follow-up appointment in: 3 MONTHS WITH DR HILTY   We recommend signing up for the patient portal called MyChart.  Sign up information is provided on this After Visit Summary.  MyChart is used to connect with patients for Virtual Visits (Telemedicine).  Patients are able to view lab/test results, encounter notes, upcoming appointments, etc.  Non-urgent messages can be sent to your provider as well.   To learn more about what you can do with MyChart, go to [email or web address].   Other Instructions

## 2024-05-21 ENCOUNTER — Ambulatory Visit (HOSPITAL_BASED_OUTPATIENT_CLINIC_OR_DEPARTMENT_OTHER): Admitting: Physical Therapy

## 2024-05-24 ENCOUNTER — Encounter (HOSPITAL_COMMUNITY): Payer: Self-pay | Admitting: *Deleted

## 2024-05-24 ENCOUNTER — Telehealth (HOSPITAL_COMMUNITY): Payer: Self-pay | Admitting: *Deleted

## 2024-05-24 NOTE — Telephone Encounter (Signed)
 Reminder letter with instructions for upcoming stress test on 05/31/24 at 10:45 sent via [employer].

## 2024-05-25 ENCOUNTER — Other Ambulatory Visit: Payer: Self-pay | Admitting: Nurse Practitioner

## 2024-05-25 DIAGNOSIS — Z01810 Encounter for preprocedural cardiovascular examination: Secondary | ICD-10-CM

## 2024-05-25 DIAGNOSIS — E7849 Other hyperlipidemia: Secondary | ICD-10-CM

## 2024-05-31 ENCOUNTER — Ambulatory Visit: Payer: Self-pay | Admitting: Nurse Practitioner

## 2024-05-31 ENCOUNTER — Ambulatory Visit (HOSPITAL_COMMUNITY)
Admission: RE | Admit: 2024-05-31 | Discharge: 2024-05-31 | Disposition: A | Discharge status: Home / Self Care | Source: Ambulatory Visit | Attending: Cardiology | Admitting: Cardiology

## 2024-05-31 DIAGNOSIS — Z01810 Encounter for preprocedural cardiovascular examination: Secondary | ICD-10-CM | POA: Insufficient documentation

## 2024-05-31 DIAGNOSIS — E7849 Other hyperlipidemia: Secondary | ICD-10-CM | POA: Diagnosis not present

## 2024-05-31 LAB — MYOCARDIAL PERFUSION IMAGING
LV dias vol: 99 mL (ref 46–106)
LV sys vol: 45 mL (ref 3.8–5.2)
Nuc Stress EF: 55 %
Peak HR: 76 {beats}/min
Rest HR: 58 {beats}/min
Rest Nuclear Isotope Dose: 11.5 mCi
SDS: 0
SRS: 2
SSS: 1
ST Depression (mm): 0 mm
Stress Nuclear Isotope Dose: 31.4 mCi
TID: 0.97

## 2024-05-31 MED ORDER — REGADENOSON 0.4 MG/5ML IV SOLN
INTRAVENOUS | Status: AC
  Filled 2024-05-31: qty 5

## 2024-05-31 MED ORDER — TECHNETIUM TC 99M TETROFOSMIN IV KIT
11.5000 | PACK | Freq: Once | INTRAVENOUS | Status: AC | PRN
  Administered 2024-05-31: 11.5 via INTRAVENOUS

## 2024-05-31 MED ORDER — REGADENOSON 0.4 MG/5ML IV SOLN
0.4000 mg | Freq: Once | INTRAVENOUS | Status: AC
  Administered 2024-05-31: 0.4 mg via INTRAVENOUS

## 2024-05-31 MED ORDER — TECHNETIUM TC 99M TETROFOSMIN IV KIT
31.4000 | PACK | Freq: Once | INTRAVENOUS | Status: AC | PRN
  Administered 2024-05-31: 31.4 via INTRAVENOUS

## 2024-06-01 MED ORDER — DILTIAZEM HCL ER COATED BEADS 180 MG PO CP24: 180.0000 mg | ORAL_CAPSULE | Freq: Every day | ORAL | 0 refills | Status: AC

## 2024-06-03 DIAGNOSIS — M25562 Pain in left knee: Secondary | ICD-10-CM | POA: Diagnosis not present

## 2024-06-07 ENCOUNTER — Other Ambulatory Visit: Payer: Self-pay

## 2024-06-07 DIAGNOSIS — E7849 Other hyperlipidemia: Secondary | ICD-10-CM

## 2024-06-07 DIAGNOSIS — Z01810 Encounter for preprocedural cardiovascular examination: Secondary | ICD-10-CM

## 2024-06-28 ENCOUNTER — Encounter: Payer: Self-pay | Admitting: Obstetrics and Gynecology

## 2024-06-28 ENCOUNTER — Ambulatory Visit (INDEPENDENT_AMBULATORY_CARE_PROVIDER_SITE_OTHER): Admitting: Obstetrics and Gynecology

## 2024-06-28 VITALS — BP 139/82 | HR 73

## 2024-06-28 DIAGNOSIS — N393 Stress incontinence (female) (male): Secondary | ICD-10-CM | POA: Diagnosis not present

## 2024-06-28 DIAGNOSIS — N3281 Overactive bladder: Secondary | ICD-10-CM | POA: Diagnosis not present

## 2024-06-28 MED ORDER — MIRABEGRON ER 25 MG PO TB24: 25.0000 mg | ORAL_TABLET | Freq: Every day | ORAL | 5 refills | Status: AC

## 2024-06-28 NOTE — Assessment & Plan Note (Signed)
-   Can try trospium  or myrbetriq . She prefers to try myrbetriq  again.

## 2024-06-28 NOTE — Assessment & Plan Note (Signed)
 For treatment of stress urinary incontinence,  non-surgical options include expectant management, weight loss, physical therapy, as well as a pessary.  Surgical options include a midurethral sling,  and transurethral injection of a bulking agent. - She is working on weight loss. She is still nervous about having a sling procedure.  - She prefers a less invasive option like the urethral bulking. This was attempted previously in the office which resulted in a panic attack. So will plan to do this in the OR.  We discussed success rate of approximately 70-80% and possible need for second injection- we discussed may be less effective for her at a higher BMI. We reviewed that this is not a permanent procedure and the Bulkamid does become less effective over time. Risks reviewed including injury to bladder or urethra, UTI, urinary retention and hematuria.  - Have received cardiac clearance.

## 2024-06-28 NOTE — Progress Notes (Signed)
 [facility]   Subjective:     Chief Complaint: Pessary Check Paula [name] is a 63 y.o. female is here for pessary check.cleaning.)  History of Present Illness: Paula [name] is a 63 y.o. female with stress incontinence and OAB.   Has an incontinence ring pessary. No problems with the pessary but it only minimally controls her leakage with cough/ sneeze.   Previously tried Myrbetriq  for OAB but feels she did not give it a good chance. Tried to get gemtesa  covered but was unable to get it filled. Still has some trouble holding on the way to the bathroom when she gets an urge.    Past Medical History: Patient  has a past medical history of Anxiety, Chronic lower back pain, Conversion disorder, Depression, Diverticulitis, Expressive aphasia (11/15/2016), Fibromyalgia, GERD (gastroesophageal reflux disease), Heart murmur, High cholesterol, History of gastric ulcer, History of hiatal hernia, History of kidney stones, History of thyroid  nodule, Hyperlipidemia, Hypertension, IBS (irritable bowel syndrome), Intermittent vertigo, Migraine, Obesity, OSA on CPAP, Osteoarthritis, Pneumonia (X 1), Psychogenic tremor, Refusal of blood transfusions as patient is Jehovah's Witness, RLS (restless legs syndrome), Sleep apnea, Urinary incontinence, and Vertigo.   Past Surgical History: She  has a past surgical history that includes Cesarean section (1977; 1982); Anterior cervical decomp/discectomy fusion (02/10/2004); Umbilical hernia repair (age 12); Cardiovascular stress test (2017); Cystoscopy with ureteroscopy and stent placement (Right, 06/07/2015); Holmium laser application (N/A, 06/07/2015); Cystoscopy w/ retrogrades (Right, 06/07/2015); Upper gi endoscopy; Colonoscopy; Back surgery; Laparoscopic cholecystectomy (10/07/2000); Hernia repair; Abdominal hysterectomy (1990); Lipoma excision (01/2017); and Direct laryngoscopy (N/A, 10/29/2021).   Medications: She has a current medication list  which includes the following prescription(s): accu-chek guide test, accu-chek softclix lancets, acetaminophen , albuterol , aspirin  ec, benzonatate , accu-chek guide, vitamin d -1000 max st, vitamin b 12, cyclobenzaprine , dexlansoprazole , dicyclomine , diltiazem , repatha  sureclick, famotidine , fluticasone , mirabegron  er, ondansetron , pantoprazole , promethazine -dextromethorphan, ozempic (0.25 or 0.5 mg/dose), sucralfate , symbicort , tramadol , vitamin e, GI Cocktail (alum & mag hydroxide, lidocaine , dicyclomine ) oral mixture, and methylprednisolone , and the following Facility-Administered Medications: sodium chloride .   Allergies: Patient is allergic to detrol [tolterodine], gabapentin , aspirin , contrast media [iodinated contrast media], imitrex  [sumatriptan ], oxycodone , milk-related compounds, rosuvastatin , vicodin hp [hydrocodone -acetaminophen ], and avelox [moxifloxacin].   Social History: Patient  reports that she quit smoking about 15 years ago. Her smoking use included cigarettes. She started smoking about 40 years ago. She has a 12.5 pack-year smoking history. She has never used smokeless tobacco. She reports current alcohol use. She reports that she does not use drugs.      Objective:    BP 139/82   Pulse 73   Gen: No apparent distress, A&O x 3. Pelvic Exam: Pessary removed and cleaned. Normal external genitalia. On speculum, normal vaginal mucosa. Pessary replaced and was comfortable.    Assessment/Plan:    Assessment: Paula [name] is a 63 y.o. with stress incontinence and OAB   Plan: OAB (overactive bladder) Assessment & Plan: - Can try trospium  or myrbetriq . She prefers to try myrbetriq  again.   Orders: -     Mirabegron  ER; Take 1 tablet (25 mg total) by mouth daily.  Dispense: 30 tablet; Refill: 5  SUI (stress urinary incontinence, female) Assessment & Plan: For treatment of stress urinary incontinence,  non-surgical options include expectant management, weight loss, physical  therapy, as well as a pessary.  Surgical options include a midurethral sling,  and transurethral injection of a bulking agent. - She is working on weight loss. She is still nervous about having a sling  procedure.  - She prefers a less invasive option like the urethral bulking. This was attempted previously in the office which resulted in a panic attack. So will plan to do this in the OR.  We discussed success rate of approximately 70-80% and possible need for second injection- we discussed may be less effective for her at a higher BMI. We reviewed that this is not a permanent procedure and the Bulkamid does become less effective over time. Risks reviewed including injury to bladder or urethra, UTI, urinary retention and hematuria.  - Have received cardiac clearance.     Request sent for surgery scheduling  Rosaline LOISE Caper, MD

## 2024-07-01 ENCOUNTER — Encounter (HOSPITAL_BASED_OUTPATIENT_CLINIC_OR_DEPARTMENT_OTHER): Payer: Self-pay | Admitting: *Deleted

## 2024-07-05 DIAGNOSIS — R140 Abdominal distension (gaseous): Secondary | ICD-10-CM | POA: Diagnosis not present

## 2024-07-05 DIAGNOSIS — K219 Gastro-esophageal reflux disease without esophagitis: Secondary | ICD-10-CM | POA: Diagnosis not present

## 2024-07-05 DIAGNOSIS — K760 Fatty (change of) liver, not elsewhere classified: Secondary | ICD-10-CM | POA: Diagnosis not present

## 2024-07-05 DIAGNOSIS — Z800 Family history of malignant neoplasm of digestive organs: Secondary | ICD-10-CM | POA: Diagnosis not present

## 2024-07-05 DIAGNOSIS — K5900 Constipation, unspecified: Secondary | ICD-10-CM | POA: Diagnosis not present

## 2024-07-05 DIAGNOSIS — Z860109 Personal history of other colon polyps: Secondary | ICD-10-CM | POA: Diagnosis not present

## 2024-07-08 ENCOUNTER — Encounter: Payer: Self-pay | Admitting: *Deleted

## 2024-07-14 DIAGNOSIS — Z8679 Personal history of other diseases of the circulatory system: Secondary | ICD-10-CM | POA: Diagnosis not present

## 2024-07-14 DIAGNOSIS — I10 Essential (primary) hypertension: Secondary | ICD-10-CM | POA: Diagnosis not present

## 2024-07-14 DIAGNOSIS — S6010XA Contusion of unspecified finger with damage to nail, initial encounter: Secondary | ICD-10-CM | POA: Diagnosis not present

## 2024-07-22 ENCOUNTER — Ambulatory Visit (HOSPITAL_COMMUNITY): Admission: RE | Admit: 2024-07-22 | Source: Home / Self Care | Admitting: Obstetrics and Gynecology

## 2024-07-22 ENCOUNTER — Encounter (HOSPITAL_COMMUNITY): Admission: RE | Payer: Self-pay | Source: Home / Self Care

## 2024-07-22 SURGERY — INJECTION, BULKING AGENT, URETHRA
Anesthesia: Monitor Anesthesia Care

## 2024-08-06 DIAGNOSIS — E782 Mixed hyperlipidemia: Secondary | ICD-10-CM | POA: Diagnosis not present

## 2024-08-06 DIAGNOSIS — R7303 Prediabetes: Secondary | ICD-10-CM | POA: Diagnosis not present

## 2024-08-09 DIAGNOSIS — M25562 Pain in left knee: Secondary | ICD-10-CM | POA: Diagnosis not present

## 2024-08-10 DIAGNOSIS — I471 Supraventricular tachycardia, unspecified: Secondary | ICD-10-CM | POA: Diagnosis not present

## 2024-08-10 DIAGNOSIS — E782 Mixed hyperlipidemia: Secondary | ICD-10-CM | POA: Diagnosis not present

## 2024-08-10 DIAGNOSIS — F411 Generalized anxiety disorder: Secondary | ICD-10-CM | POA: Diagnosis not present

## 2024-08-10 DIAGNOSIS — K219 Gastro-esophageal reflux disease without esophagitis: Secondary | ICD-10-CM | POA: Diagnosis not present

## 2024-08-10 DIAGNOSIS — K588 Other irritable bowel syndrome: Secondary | ICD-10-CM | POA: Diagnosis not present

## 2024-08-10 DIAGNOSIS — Z1331 Encounter for screening for depression: Secondary | ICD-10-CM | POA: Diagnosis not present

## 2024-08-10 DIAGNOSIS — R7303 Prediabetes: Secondary | ICD-10-CM | POA: Diagnosis not present

## 2024-08-10 DIAGNOSIS — Z0000 Encounter for general adult medical examination without abnormal findings: Secondary | ICD-10-CM | POA: Diagnosis not present

## 2024-08-10 DIAGNOSIS — I10 Essential (primary) hypertension: Secondary | ICD-10-CM | POA: Diagnosis not present

## 2024-08-16 ENCOUNTER — Ambulatory Visit: Attending: Internal Medicine | Admitting: Internal Medicine

## 2024-08-16 ENCOUNTER — Other Ambulatory Visit (HOSPITAL_COMMUNITY): Payer: Self-pay

## 2024-08-16 ENCOUNTER — Telehealth: Payer: Self-pay | Admitting: Pharmacy Technician

## 2024-08-16 ENCOUNTER — Encounter: Payer: Self-pay | Admitting: Internal Medicine

## 2024-08-16 VITALS — BP 138/83 | HR 70 | Ht 64.0 in | Wt 258.4 lb

## 2024-08-16 DIAGNOSIS — E7849 Other hyperlipidemia: Secondary | ICD-10-CM | POA: Diagnosis not present

## 2024-08-16 DIAGNOSIS — R002 Palpitations: Secondary | ICD-10-CM | POA: Diagnosis not present

## 2024-08-16 DIAGNOSIS — I2510 Atherosclerotic heart disease of native coronary artery without angina pectoris: Secondary | ICD-10-CM | POA: Diagnosis not present

## 2024-08-16 DIAGNOSIS — I10 Essential (primary) hypertension: Secondary | ICD-10-CM

## 2024-08-16 MED ORDER — REPATHA SURECLICK 140 MG/ML ~~LOC~~ SOAJ: 140.0000 mg | SUBCUTANEOUS | 3 refills | Status: AC

## 2024-08-16 NOTE — Progress Notes (Signed)
 LIPID CLINIC CONSULT NOTE  Chief Complaint:  Follow-up dyslipidemia  Primary Care Physician: Seabron Lenis, MD  Primary Cardiologist:  Vinie JAYSON Maxcy, MD  HPI:  Paula [name] is a 63 y.o. female who is being seen today for the evaluation of dyslipidemia at the request of Seabron Lenis, MD.  This is a pleasant 63 year old female kindly referred for evaluation management of dyslipidemia by Dr. Sheena.  She has a history of high cholesterol in the past and unfortunately has been intolerant to statins causing significant myalgias.  She also has a history of obesity, chronic pain and fibromyalgia, family history of heart disease and other medication intolerances.  Recently she underwent CT coronary angiography which demonstrated mild nonobstructive coronary disease with a calcium  score of 69, 88th percentile for age and sex matched controls.  She was informed of those results today.  She did have recent lipids however showing a significantly elevated cholesterol with total of 384, triglycerides 136, HDL 53 and LDL 304.  She reports diet that seems to have a significant amount of saturated fats but does not eat fried food regularly however does use a lot of sources of saturated fat and would benefit from dietary modification.  That being said given her severely elevated cholesterol, its likely she has familial hyperlipidemia.  She also reported some palpitations today and an EKG was performed.  This was personally reviewed and indicates sinus rhythm, nonspecific T wave changes at 62.  04/23/2022  Paula [name] returns today for follow-up.  She reports compliance with the Repatha  although unfortunately did not get her lipids rechecked.  Her LDL was quite high at 304 and we did send her for genetic testing.  This did show 3 variants of unknown significance including mutations in APO A5, LPL and LP(a).  It is expected that her LP(a) is quite high however the Repatha  should reduce that by about 20 to 30%.   Unfortunately she could not tolerate statins.  Ultimately she may need additional therapy.  She has also been describing leg pain when she walks.  Is not always associated with walking a certain distance in fact she can have some pain with sitting in certain positions.  She notes also she gets discomfort that in her legs and buttocks when she has bowel movements.  This is concerning for possible lumbosacral disease or perhaps related to spinal cord impingement.  She denies any specific numbness or anesthesia.  She does see orthopedics and I encouraged her to follow-up with them.  My suspicion for arterial insufficiency is low.  Have coronary CT angiography in January which showed a calcium  score of 69, 88th percentile for sex and matched control, again suggesting early onset heart disease however only minimal nonobstructive coronary disease was noted.  08/27/2022  Paula [name] seen today in follow-up.  She has done well on Repatha  with marked reduction in her lipids.  When I saw her last however her cholesterol was still little higher than ideal.  As mentioned above she has a genetic dyslipidemia.  I advised adding ezetimibe  but this never was started.  Repeat lipids are essentially stable.  LDL particle #1379, LDL-C of 110, HDL-C of 44 and triglycerides 221.  Of concern today, she is mention she has been having some pain in the left side of her neck as well as the left clavicular area.  This has been worsening over the past several weeks to months.  Worse when she lays on her left side.  She is  also noted some swelling there.  She gets some hoarseness with her voice and some drainage and difficulty swallowing.  She also has some left ear pain.  She notes no hearing loss.  She denies any fever, she has no constitutional symptoms, no recent infection.  She has had some dental work and has some bridges but this feels different than typical tooth pain.  She has a history of prior ACDF of C4-C7.  This could represent  a cervical radiculopathy.    12/26/2022  Paula [name] is seen today in follow-up.  She has been dealing with a number of issues with neck and back pain.  This is limited her activity.  She did have repeat cholesterol testing from her primary care provider.  This was a regular lipid profile and demonstrated total cholesterol 200, HDL 51, triglycerides 169 and LDL 119.  This is in the range of previous testing for her.  Her cholesterol still is elevated above target.  We have previously discussed the possibility of additional therapy.  She says she is intent on more weight loss and activity.  She has signed up for a new gym membership.  06/23/2023  Paula [name] returns today for follow-up.  She has not been taking ezetimibe .  She has been under a lot of stress recently has been caring for her husband who had knee surgery.  Her last lipids in January showed total cholesterol 200 with LDL of 119.  She said she had recent labs drawn last week however they have not yet resulted.  This may be related to IT difficulties last week.  She is on Repatha  but not taking ezetimibe .  She also reports has been having some worsening palpitations.  She apparently wore monitor for this in 2019 which was normal.  08/16/2024  Paula [name] returns today for follow-up.  She continues to have issues with IBS-D and has come off of a number of medications due to that.  She has been more active doing more exercise as well.  She had recent labs from her primary care provider.  Total cholesterol was 219, triglycerides 205, HDL 55 and LDL 128.  She has a history of familial hypercholesterolemia with total cholesterol over 300 in the past as well as a high LP(a) which improved on Repatha .  Unfortunately she has been off of her Repatha  for some time.  She also never started on ezetimibe .  Advised her to start on therapy again.  She did have coronary artery calcification.  She gets infrequent palpitations particularly after we switched her to  diltiazem  from amlodipine .  Blood pressure is reasonable today 138/83.  PMHx:  Past Medical History:  Diagnosis Date   Anxiety    pt stated anxiety episodes resemble stroke symptoms   Chronic lower back pain    Conversion disorder    Depression    Diverticulitis    Expressive aphasia 11/15/2016   Fibromyalgia    GERD (gastroesophageal reflux disease)    Heart murmur    High cholesterol    History of gastric ulcer    History of hiatal hernia    History of kidney stones    History of thyroid  nodule    Hyperlipidemia    Hypertension    IBS (irritable bowel syndrome)    Intermittent vertigo    Migraine    a few times/year now; maybe (11/15/2016)   Obesity    OSA on CPAP    wears CPAP nightly ;study in epic 02-01-2014 (11/15/2016)   Osteoarthritis  knees, hands, back, hips (11/15/2016)   Pneumonia X 1   hx of   Psychogenic tremor    intermittant head tremor-(11/15/2016)   Refusal of blood transfusions as patient is Jehovah's Witness    RLS (restless legs syndrome)    Sleep apnea    CPAP   Urinary incontinence    Vertigo     Past Surgical History:  Procedure Laterality Date   ABDOMINAL HYSTERECTOMY  1990   Partial, still has ovaries   ANTERIOR CERVICAL DECOMP/DISCECTOMY FUSION  02/10/2004   C4 -- C7   BACK SURGERY     CARDIOVASCULAR STRESS TEST  2017   Negative   CESAREAN SECTION  1977; 1982   COLONOSCOPY     CYSTOSCOPY W/ RETROGRADES Right 06/07/2015   Procedure: CYSTOSCOPY WITH RETROGRADE PYELOGRAM;  Surgeon: Morene LELON Salines, MD;  Location: [facility];  Service: Urology;  Laterality: Right;   CYSTOSCOPY WITH URETEROSCOPY AND STENT PLACEMENT Right 06/07/2015   Procedure: RIGHT URETEROSCOPY, LASER LITHOTRIPSY, STONE REMOVAL AND RIGHT URETERAL STENT PLACEMENT;  Surgeon: Morene LELON Salines, MD;  Location: [facility];  Service: Urology;  Laterality: Right;   DIRECT LARYNGOSCOPY N/A 10/29/2021   Procedure: DIRECT LARYNGOSCOPY  WITH BIOPSY;  Surgeon: Carlie Clark, MD;  Location: [facility] OR;  Service: ENT;  Laterality: N/A;   HERNIA REPAIR     HOLMIUM LASER APPLICATION N/A 06/07/2015   Procedure: HOLMIUM LASER APPLICATION;  Surgeon: Morene LELON Salines, MD;  Location: [facility];  Service: Urology;  Laterality: N/A;   LAPAROSCOPIC CHOLECYSTECTOMY  10/07/2000   LIPOMA EXCISION  01/2017   from neck   UMBILICAL HERNIA REPAIR  age 68   UPPER GI ENDOSCOPY      FAMHx:  Family History  Problem Relation Age of Onset   Hyperlipidemia Mother    Hypertension Mother    Sleep apnea Mother    Osteoporosis Mother    Hypertension Father    Heart disease Father    Syncope episode Father    Colon cancer Father    Atrial fibrillation Father    Hypertension Sister    Sleep apnea Sister    Sleep apnea Brother    Hypertension Brother    Colon cancer Maternal Aunt    Lung cancer Maternal Aunt    Hypertension Maternal Grandmother    CVA Maternal Grandmother    Pancreatic cancer Maternal Grandfather    Colon cancer Maternal Grandfather    Hypertension Maternal Grandfather    Lung cancer Maternal Grandfather    Throat cancer Maternal Grandfather    Colon cancer Paternal Grandfather    Esophageal cancer Neg Hx    Liver cancer Neg Hx    Stomach cancer Neg Hx     SOCHx:   reports that she quit smoking about 16 years ago. Her smoking use included cigarettes. She started smoking about 41 years ago. She has a 12.5 pack-year smoking history. She has never used smokeless tobacco. She reports current alcohol use. She reports that she does not use drugs.  ALLERGIES:  Allergies  Allergen Reactions   Detrol [Tolterodine] Other (See Comments)    slurred speech, tremors, dizziness   Gabapentin  Palpitations and Other (See Comments)    Wt gain, tremor   Aspirin  Other (See Comments)    Avoids--- history of gastric ulcers    Contrast Media [Iodinated Contrast Media] Hives, Itching and Rash    CT contrast-Needed to take  benadryl     Imitrex  [Sumatriptan ] Other (See Comments)  Body hurts   Oxycodone  Nausea And Vomiting   Milk-Related Compounds Other (See Comments)   Rosuvastatin  Other (See Comments)   Vicodin Hp [Hydrocodone -Acetaminophen ] Nausea And Vomiting   Avelox [Moxifloxacin] Itching    ROS: Pertinent items noted in HPI and remainder of comprehensive ROS otherwise negative.  HOME MEDS: Current Outpatient Medications on File Prior to Visit  Medication Sig Dispense Refill   ACCU-CHEK GUIDE TEST test strip 1 each by Other route as needed.     Accu-Chek Softclix Lancets lancets daily.     acetaminophen  (TYLENOL ) 500 MG tablet Take 1,000 mg by mouth every 6 (six) hours as needed for headache (pain).     albuterol  (VENTOLIN  HFA) 108 (90 Base) MCG/ACT inhaler 2 puffs every 6 (six) hours as needed for shortness of breath or wheezing.     aspirin  EC 81 MG tablet Take 1 tablet (81 mg total) by mouth daily. Swallow whole.     benzonatate  (TESSALON ) 100 MG capsule Take 1 capsule (100 mg total) by mouth every 8 (eight) hours. 21 capsule 0   Blood Glucose Monitoring Suppl (ACCU-CHEK GUIDE) w/Device KIT USE AS DIRECTED ONCE DAILY TO CHECK BLOOD SUGAR LEVEL 30 DAYS     Cholecalciferol (VITAMIN D -1000 MAX ST) 25 MCG (1000 UT) tablet Take by mouth.     Cyanocobalamin  (VITAMIN B 12) 500 MCG TABS 1 tablet     cyclobenzaprine  (FLEXERIL ) 10 MG tablet Take 1 tablet by mouth at bedtime as needed for muscle spasms.     dexlansoprazole  (DEXILANT ) 60 MG capsule Take 60 mg by mouth at bedtime.     dicyclomine  (BENTYL ) 20 MG tablet Take 1 tablet (20 mg total) by mouth 2 (two) times daily. 60 tablet 11   diltiazem  (CARDIZEM  CD) 180 MG 24 hr capsule Take 1 capsule (180 mg total) by mouth daily. 90 capsule 0   Evolocumab  (REPATHA  SURECLICK) 140 MG/ML SOAJ Inject 140 mg into the skin every 14 (fourteen) days. 6 mL 0   fluticasone  (FLONASE ) 50 MCG/ACT nasal spray Place 2 sprays into both nostrils as needed.     mirabegron  ER  (MYRBETRIQ ) 25 MG TB24 tablet Take 1 tablet (25 mg total) by mouth daily. 30 tablet 5   ondansetron  (ZOFRAN -ODT) 8 MG disintegrating tablet Take 1 tablet (8 mg total) by mouth every 8 (eight) hours as needed for nausea or vomiting. 20 tablet 0   pantoprazole  (PROTONIX ) 40 MG tablet Take by mouth.     promethazine -dextromethorphan (PROMETHAZINE -DM) 6.25-15 MG/5ML syrup Take 5 mLs by mouth 4 (four) times daily as needed for cough. 540 mL 1   Semaglutide,0.25 or 0.5MG /DOS, (OZEMPIC, 0.25 OR 0.5 MG/DOSE,) 2 MG/1.5ML SOPN Inject 0.5 mg into the skin once a week.     sucralfate  (CARAFATE ) 1 g tablet Take 1 tablet (1 g total) by mouth 4 (four) times daily -  with meals and at bedtime. 120 tablet 0   SYMBICORT  160-4.5 MCG/ACT inhaler as needed.     traMADol  (ULTRAM ) 50 MG tablet Take 50 mg by mouth 3 (three) times daily as needed.     Vitamin E 670 MG (1000 UT) CAPS Take by mouth.     GI Cocktail (alum & mag hydroxide, lidocaine , dicyclomine ) oral mixture 90 ml 2 % Viscous Lisocaine, 90 ml Dicyclomine  10mg /5 ml, 270ml maalox 400 mg 5 ml by mouth every 8 hours as needed for AB pain (Patient not taking: Reported on 08/16/2024) 550 mL 0   methylPREDNISolone  (MEDROL  DOSEPAK) 4 MG TBPK tablet Take 4 mg by  mouth as directed. (Patient not taking: Reported on 08/16/2024)     Current Facility-Administered Medications on File Prior to Visit  Medication Dose Route Frequency Provider Last Rate Last Admin   0.9 %  sodium chloride  infusion  500 mL Intravenous Once Abran Norleen SAILOR, MD        LABS/IMAGING: No results found for this or any previous visit (from the past 48 hours). No results found.  LIPID PANEL:    Component Value Date/Time   CHOL 281 (H) 07/01/2020 0206   TRIG 175 (H) 07/01/2020 0206   HDL 44 07/01/2020 0206   CHOLHDL 6.4 07/01/2020 0206   VLDL 35 07/01/2020 0206   LDLCALC 202 (H) 07/01/2020 0206   LDLDIRECT 170.1 01/08/2010 0943    WEIGHTS: Wt Readings from Last 3 Encounters:  08/16/24 258 lb  6.4 oz (117.2 kg)  05/20/24 257 lb (116.6 kg)  03/11/24 261 lb 6.4 oz (118.6 kg)    VITALS: BP 138/83 (BP Location: Left Arm, Patient Position: Sitting)   Pulse 70   Ht 5' 4 (1.626 m)   Wt 258 lb 6.4 oz (117.2 kg)   SpO2 98%   BMI 44.35 kg/m   EXAM: General appearance: alert, no distress, and morbidly obese Lungs: clear to auscultation bilaterally Heart: regular rate and rhythm, S1, S2 normal, no murmur, click, rub or gallop Extremities: extremities normal, atraumatic, no cyanosis or edema  EKG: Deferred  ASSESSMENT: Palpitations Genetically confirmed familial hyperlipidemia, LDL greater than 300, notable mutations in APO A5, LPL and LP(a) Premature coronary disease with a calcium  score 69, 88th percentile for age and sex matched controls, minimal nonobstructive disease by CTA (12/2021) Family history of coronary disease Atherogenic diet Morbid obesity Statin intolerance-myalgias Leg pain with walking/lumbosacral pain, worse with defecation Left neck pain  PLAN: 1.   Paula [name] continues to have dyslipidemia with LDL that is much over target.  She has been off of her Repatha .  I advised her to restart that.  Will make sure she has an active prescription for that.  She will need repeat lipids in about 3 months.  Will schedule a follow-up with our APP in about 6 months.  Her palpitations are generally well-controlled on diltiazem .  Blood pressure is near target today.  She has been prescribed the Ozempic although I do not know how well it is tolerated for weight loss.  Plan follow-up in 6 months or sooner as necessary.  Vinie KYM Maxcy, MD, [facility], FNLA, FACP  [place]  [facility]  Medical Director of the Advanced Lipid Disorders &  Cardiovascular Risk Reduction Clinic Diplomate of the American Board of Clinical Lipidology Attending Cardiologist  Direct Dial: [phone]  Fax: [phone]  Website:  [email or web address] Vinie BROCKS Chala Gul 08/16/2024, 9:01 AM

## 2024-08-16 NOTE — Addendum Note (Signed)
 Addended by: LORING ANDRIETTE HERO on: 08/16/2024 09:40 AM   Modules accepted: Orders

## 2024-08-16 NOTE — Patient Instructions (Signed)
 Medication Instructions:  Resume Repatha  - once every 14 days  *If you need a refill on your cardiac medications before your next appointment, please call your pharmacy*  Lab Work: FASTING NMR lipoprofile in 3 months  If you have labs (blood work) drawn today and your tests are completely normal, you will receive your results only by: MyChart Message (if you have MyChart) OR A paper copy in the mail If you have any lab test that is abnormal or we need to change your treatment, we will call you to review the results.  Follow-Up: At [facility], you and your health needs are our priority.  As part of our continuing mission to provide you with exceptional heart care, our providers are all part of one team.  This team includes your primary Cardiologist (physician) and Advanced Practice Providers or APPs (Physician Assistants and Nurse Practitioners) who all work together to provide you with the care you need, when you need it.  Your next appointment:    6 months with PA or NP  We recommend signing up for the patient portal called MyChart.  Sign up information is provided on this After Visit Summary.  MyChart is used to connect with patients for Virtual Visits (Telemedicine).  Patients are able to view lab/test results, encounter notes, upcoming appointments, etc.  Non-urgent messages can be sent to your provider as well.   To learn more about what you can do with MyChart, go to [email or web address].

## 2024-08-16 NOTE — Telephone Encounter (Signed)
 Pharmacy Patient Advocate Encounter   Received notification from Physician's Office - jenna that prior authorization for repatha  is required/requested.   Insurance verification completed.   The patient is insured through [employer] .   Per test claim: The current 08/16/24 day co-pay is, $12.15- 3 months.  No PA needed at this time. This test claim was processed through [facility]- copay amounts may vary at other pharmacies due to pharmacy/plan contracts, or as the patient moves through the different stages of their insurance plan.     Valid PA on file until 12/01/24

## 2024-08-23 DIAGNOSIS — K219 Gastro-esophageal reflux disease without esophagitis: Secondary | ICD-10-CM | POA: Diagnosis not present

## 2024-08-23 DIAGNOSIS — R1310 Dysphagia, unspecified: Secondary | ICD-10-CM | POA: Diagnosis not present

## 2024-08-23 DIAGNOSIS — K5900 Constipation, unspecified: Secondary | ICD-10-CM | POA: Diagnosis not present

## 2024-08-23 DIAGNOSIS — R1319 Other dysphagia: Secondary | ICD-10-CM | POA: Diagnosis not present

## 2024-08-23 DIAGNOSIS — K635 Polyp of colon: Secondary | ICD-10-CM | POA: Diagnosis not present

## 2024-08-23 DIAGNOSIS — K648 Other hemorrhoids: Secondary | ICD-10-CM | POA: Diagnosis not present

## 2024-08-23 DIAGNOSIS — K2100 Gastro-esophageal reflux disease with esophagitis, without bleeding: Secondary | ICD-10-CM | POA: Diagnosis not present

## 2024-08-23 DIAGNOSIS — R1013 Epigastric pain: Secondary | ICD-10-CM | POA: Diagnosis not present

## 2024-08-23 DIAGNOSIS — Z800 Family history of malignant neoplasm of digestive organs: Secondary | ICD-10-CM | POA: Diagnosis not present

## 2024-08-23 DIAGNOSIS — R140 Abdominal distension (gaseous): Secondary | ICD-10-CM | POA: Diagnosis not present

## 2024-08-23 DIAGNOSIS — Z860109 Personal history of other colon polyps: Secondary | ICD-10-CM | POA: Diagnosis not present

## 2024-08-23 DIAGNOSIS — R197 Diarrhea, unspecified: Secondary | ICD-10-CM | POA: Diagnosis not present

## 2024-08-23 DIAGNOSIS — K449 Diaphragmatic hernia without obstruction or gangrene: Secondary | ICD-10-CM | POA: Diagnosis not present

## 2024-09-03 DIAGNOSIS — N3281 Overactive bladder: Secondary | ICD-10-CM | POA: Diagnosis not present

## 2024-09-03 DIAGNOSIS — N390 Urinary tract infection, site not specified: Secondary | ICD-10-CM | POA: Diagnosis not present

## 2024-09-06 DIAGNOSIS — G4733 Obstructive sleep apnea (adult) (pediatric): Secondary | ICD-10-CM | POA: Diagnosis not present

## 2024-09-10 DIAGNOSIS — R6881 Early satiety: Secondary | ICD-10-CM | POA: Diagnosis not present

## 2024-09-10 DIAGNOSIS — K219 Gastro-esophageal reflux disease without esophagitis: Secondary | ICD-10-CM | POA: Diagnosis not present

## 2024-09-10 DIAGNOSIS — K5900 Constipation, unspecified: Secondary | ICD-10-CM | POA: Diagnosis not present

## 2024-09-10 DIAGNOSIS — K760 Fatty (change of) liver, not elsewhere classified: Secondary | ICD-10-CM | POA: Diagnosis not present

## 2024-09-14 DIAGNOSIS — I700 Atherosclerosis of aorta: Secondary | ICD-10-CM | POA: Diagnosis not present

## 2024-09-14 DIAGNOSIS — M1990 Unspecified osteoarthritis, unspecified site: Secondary | ICD-10-CM | POA: Diagnosis not present

## 2024-09-14 DIAGNOSIS — F325 Major depressive disorder, single episode, in full remission: Secondary | ICD-10-CM | POA: Diagnosis not present

## 2024-09-14 DIAGNOSIS — I739 Peripheral vascular disease, unspecified: Secondary | ICD-10-CM | POA: Diagnosis not present

## 2024-09-14 DIAGNOSIS — I10 Essential (primary) hypertension: Secondary | ICD-10-CM | POA: Diagnosis not present

## 2024-09-14 DIAGNOSIS — E785 Hyperlipidemia, unspecified: Secondary | ICD-10-CM | POA: Diagnosis not present

## 2024-09-14 DIAGNOSIS — J439 Emphysema, unspecified: Secondary | ICD-10-CM | POA: Diagnosis not present

## 2024-09-14 DIAGNOSIS — I471 Supraventricular tachycardia, unspecified: Secondary | ICD-10-CM | POA: Diagnosis not present

## 2024-10-05 DIAGNOSIS — M160 Bilateral primary osteoarthritis of hip: Secondary | ICD-10-CM | POA: Diagnosis not present

## 2024-10-05 DIAGNOSIS — R030 Elevated blood-pressure reading, without diagnosis of hypertension: Secondary | ICD-10-CM | POA: Diagnosis not present

## 2024-10-05 DIAGNOSIS — M25552 Pain in left hip: Secondary | ICD-10-CM | POA: Diagnosis not present

## 2024-10-05 DIAGNOSIS — M2560 Stiffness of unspecified joint, not elsewhere classified: Secondary | ICD-10-CM | POA: Diagnosis not present

## 2024-10-07 DIAGNOSIS — N3281 Overactive bladder: Secondary | ICD-10-CM | POA: Diagnosis not present

## 2024-10-07 DIAGNOSIS — R3915 Urgency of urination: Secondary | ICD-10-CM | POA: Diagnosis not present

## 2024-10-07 DIAGNOSIS — N393 Stress incontinence (female) (male): Secondary | ICD-10-CM | POA: Diagnosis not present

## 2024-10-20 DIAGNOSIS — N3946 Mixed incontinence: Secondary | ICD-10-CM | POA: Diagnosis not present

## 2024-10-20 DIAGNOSIS — R278 Other lack of coordination: Secondary | ICD-10-CM | POA: Diagnosis not present

## 2024-10-20 DIAGNOSIS — R350 Frequency of micturition: Secondary | ICD-10-CM | POA: Diagnosis not present

## 2024-10-20 DIAGNOSIS — M6289 Other specified disorders of muscle: Secondary | ICD-10-CM | POA: Diagnosis not present

## 2024-10-21 DIAGNOSIS — R140 Abdominal distension (gaseous): Secondary | ICD-10-CM | POA: Diagnosis not present

## 2024-11-18 ENCOUNTER — Telehealth: Payer: Self-pay | Admitting: Internal Medicine

## 2024-11-18 NOTE — Telephone Encounter (Signed)
 [employer] is calling to see if we have received the drug therapy alert they have faxed over. They need it faxed back.

## 2024-11-18 NOTE — Telephone Encounter (Signed)
 Spoke with agent at [facility]. Agent stated they needed a reply to fax or the system will continue to send out reminders each week. Stated they send to Dr Francyne. Advised I would sent over to his covering.

## 2024-12-03 ENCOUNTER — Telehealth: Payer: Self-pay | Admitting: Internal Medicine

## 2024-12-03 NOTE — Telephone Encounter (Signed)
 Hi. Pt came into office to drop off ppwk to be completed. Please call & inform pt when done (doesn't want to pickup but just wants to know) & fax to number listed on ppwk. Thank you.

## 2024-12-06 ENCOUNTER — Telehealth: Payer: Self-pay | Admitting: Internal Medicine

## 2024-12-08 NOTE — Telephone Encounter (Signed)
 Faxed to [employer] SNP paperwork, completed by MD Fax: [phone]

## 2024-12-13 ENCOUNTER — Encounter: Payer: Self-pay | Admitting: *Deleted

## 2024-12-22 ENCOUNTER — Other Ambulatory Visit: Payer: Self-pay | Admitting: Family Medicine

## 2024-12-22 DIAGNOSIS — Z1231 Encounter for screening mammogram for malignant neoplasm of breast: Secondary | ICD-10-CM

## 2025-01-03 ENCOUNTER — Ambulatory Visit

## 2025-01-06 ENCOUNTER — Telehealth: Payer: Self-pay | Admitting: Internal Medicine

## 2025-01-06 NOTE — Telephone Encounter (Signed)
 Patient c/o Palpitations:  STAT if patient reporting lightheadedness, shortness of breath, or chest pain  How long have you had palpitations/irregular HR/ Afib? Are you having the symptoms now? Several months, no symptoms at time of call   Are you currently experiencing lightheadedness, SOB or CP? No   Do you have a history of afib (atrial fibrillation) or irregular heart rhythm? Yes   Have you checked your BP or HR? (document readings if available): no   Are you experiencing any other symptoms? No   Pt has been having episodes of palpitations and sob while sitting. No symptoms at time of call, but pt wanted Dr. Mona to know. Please advise.

## 2025-01-06 NOTE — Telephone Encounter (Signed)
 Left voice message to call back 2/5

## 2025-01-07 NOTE — Telephone Encounter (Signed)
 Pt has been having episodes of palpitations and sob while sitting. No symptoms at time of call, but pt wanted Dr. Mona to know.   She reports that she has been having palpitations for a long time, but they are happening more often along with sob. She denies any dizziness.  She reports chest pain is more often- couple of times a week. This is without activity, but it does not last long.   She reports that she has not been taking diltiazem  at all. She did not want to take the increased dose of 180mg  when Jackee Alberts, NP increased it. She also thought that is was for her BP, not her heart rate. Informed her that it can also help with BP, but it helps with irregular heart beat.   She wants to see the provider before she starts this medication again. She is confused about her diagnosis and treatment. Appt made for 01/19/25 to see Dr Mona. She is aware of location.

## 2025-01-13 ENCOUNTER — Ambulatory Visit

## 2025-01-19 ENCOUNTER — Ambulatory Visit: Admitting: Internal Medicine

## 2025-03-09 ENCOUNTER — Ambulatory Visit: Admitting: Physician Assistant

## 2025-03-25 ENCOUNTER — Ambulatory Visit (HOSPITAL_BASED_OUTPATIENT_CLINIC_OR_DEPARTMENT_OTHER): Admitting: Pulmonary Disease

## 2025-04-27 ENCOUNTER — Ambulatory Visit: Admitting: Dermatology
